# Patient Record
Sex: Male | Born: 1950
Health system: Southern US, Community
[De-identification: ages and names within clinical notes are randomized; demographics above are authoritative.]

## PROBLEM LIST (undated history)

## (undated) DIAGNOSIS — C439 Malignant melanoma of skin, unspecified: Secondary | ICD-10-CM

## (undated) DIAGNOSIS — I10 Essential (primary) hypertension: Secondary | ICD-10-CM

## (undated) DIAGNOSIS — C4490 Unspecified malignant neoplasm of skin, unspecified: Secondary | ICD-10-CM

## (undated) DIAGNOSIS — F419 Anxiety disorder, unspecified: Secondary | ICD-10-CM

## (undated) DIAGNOSIS — Z9049 Acquired absence of other specified parts of digestive tract: Secondary | ICD-10-CM

## (undated) DIAGNOSIS — R220 Localized swelling, mass and lump, head: Secondary | ICD-10-CM

## (undated) DIAGNOSIS — Z973 Presence of spectacles and contact lenses: Secondary | ICD-10-CM

## (undated) DIAGNOSIS — C801 Malignant (primary) neoplasm, unspecified: Secondary | ICD-10-CM

## (undated) DIAGNOSIS — F1011 Alcohol abuse, in remission: Secondary | ICD-10-CM

## (undated) DIAGNOSIS — K766 Portal hypertension: Secondary | ICD-10-CM

## (undated) DIAGNOSIS — C419 Malignant neoplasm of bone and articular cartilage, unspecified: Secondary | ICD-10-CM

## (undated) DIAGNOSIS — F32A Depression, unspecified: Secondary | ICD-10-CM

## (undated) DIAGNOSIS — T8859XA Other complications of anesthesia, initial encounter: Secondary | ICD-10-CM

## (undated) DIAGNOSIS — K219 Gastro-esophageal reflux disease without esophagitis: Secondary | ICD-10-CM

## (undated) DIAGNOSIS — E782 Mixed hyperlipidemia: Secondary | ICD-10-CM

## (undated) DIAGNOSIS — F1910 Other psychoactive substance abuse, uncomplicated: Secondary | ICD-10-CM

## (undated) DIAGNOSIS — K7460 Unspecified cirrhosis of liver: Secondary | ICD-10-CM

## (undated) DIAGNOSIS — Z9089 Acquired absence of other organs: Secondary | ICD-10-CM

## (undated) DIAGNOSIS — D509 Iron deficiency anemia, unspecified: Secondary | ICD-10-CM

## (undated) DIAGNOSIS — C099 Malignant neoplasm of tonsil, unspecified: Secondary | ICD-10-CM

## (undated) DIAGNOSIS — C61 Malignant neoplasm of prostate: Secondary | ICD-10-CM

## (undated) HISTORY — DX: Alcohol abuse, in remission: F10.11

## (undated) HISTORY — DX: Mixed hyperlipidemia: E78.2

## (undated) HISTORY — DX: Gastro-esophageal reflux disease without esophagitis: K21.9

## (undated) HISTORY — DX: Acquired absence of other organs: Z90.89

## (undated) HISTORY — PX: PROSTATE BIOPSY: SHX241

## (undated) HISTORY — PX: TONSILLECTOMY: SUR1361

## (undated) HISTORY — PX: COLON SURGERY: SHX602

## (undated) HISTORY — DX: Other psychoactive substance abuse, uncomplicated: F19.10

## (undated) HISTORY — DX: Iron deficiency anemia, unspecified: D50.9

## (undated) HISTORY — DX: Acquired absence of other specified parts of digestive tract: Z90.49

## (undated) HISTORY — DX: Malignant melanoma of skin, unspecified: C43.9

## (undated) HISTORY — DX: Malignant neoplasm of tonsil, unspecified: C09.9

## (undated) HISTORY — PX: HERNIA REPAIR: SHX51

## (undated) HISTORY — PX: MOUTH SURGERY: SHX715

## (undated) HISTORY — DX: Essential (primary) hypertension: I10

## (undated) HISTORY — DX: Malignant (primary) neoplasm, unspecified: C80.1

---
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
## 1992-05-25 DIAGNOSIS — C439 Malignant melanoma of skin, unspecified: Secondary | ICD-10-CM

## 1992-05-25 HISTORY — DX: Malignant melanoma of skin, unspecified: C43.9

## 2011-10-19 ENCOUNTER — Inpatient Hospital Stay: Payer: Self-pay | Admitting: Psychiatry

## 2011-10-19 LAB — CBC
MCH: 33.3 pg (ref 26.0–34.0)
MCHC: 32.8 g/dL (ref 32.0–36.0)
MCV: 102 fL — ABNORMAL HIGH (ref 80–100)
Platelet: 345 10*3/uL (ref 150–440)
RBC: 4.04 10*6/uL — ABNORMAL LOW (ref 4.40–5.90)
RDW: 14.2 % (ref 11.5–14.5)

## 2011-10-19 LAB — COMPREHENSIVE METABOLIC PANEL
BUN: 6 mg/dL — ABNORMAL LOW (ref 7–18)
Bilirubin,Total: 1.2 mg/dL — ABNORMAL HIGH (ref 0.2–1.0)
Calcium, Total: 8.1 mg/dL — ABNORMAL LOW (ref 8.5–10.1)
Co2: 27 mmol/L (ref 21–32)
EGFR (African American): >60
Glucose: 121 mg/dL — ABNORMAL HIGH (ref 65–99)
Osmolality: 260 (ref 275–301)
Potassium: 3.8 mmol/L (ref 3.5–5.1)
SGPT (ALT): 55 U/L
Sodium: 130 mmol/L — ABNORMAL LOW (ref 136–145)

## 2011-10-19 LAB — ETHANOL
Ethanol %: 0.191 % — ABNORMAL HIGH (ref 0.000–0.080)
Ethanol: 191 mg/dL

## 2011-10-19 LAB — TSH: Thyroid Stimulating Horm: 2.04 u[IU]/mL

## 2011-10-19 LAB — URINALYSIS, COMPLETE
Blood: NEGATIVE
Glucose,UR: NEGATIVE mg/dL (ref 0–75)
Leukocyte Esterase: NEGATIVE
Ph: 6 (ref 4.5–8.0)
Protein: 100
RBC,UR: 1 /HPF (ref 0–5)
Specific Gravity: 1.02 (ref 1.003–1.030)
WBC UR: 5 /HPF (ref 0–5)

## 2011-10-19 LAB — DRUG SCREEN, URINE
Amphetamines, Ur Screen: NEGATIVE (ref ?–1000)
Barbiturates, Ur Screen: NEGATIVE (ref ?–200)
Cannabinoid 50 Ng, Ur ~~LOC~~: NEGATIVE (ref ?–50)
Cocaine Metabolite,Ur ~~LOC~~: NEGATIVE (ref ?–300)
MDMA (Ecstasy)Ur Screen: NEGATIVE (ref ?–500)
Methadone, Ur Screen: NEGATIVE (ref ?–300)
Opiate, Ur Screen: NEGATIVE (ref ?–300)

## 2011-10-20 LAB — BEHAVIORAL MEDICINE 1 PANEL
BUN: 6 mg/dL — ABNORMAL LOW (ref 7–18)
Basophil %: 1.4 %
Bilirubin,Total: 1.2 mg/dL — ABNORMAL HIGH (ref 0.2–1.0)
Calcium, Total: 7.5 mg/dL — ABNORMAL LOW (ref 8.5–10.1)
Chloride: 92 mmol/L — ABNORMAL LOW (ref 98–107)
Co2: 26 mmol/L (ref 21–32)
EGFR (Non-African Amer.): >60
Eosinophil #: 0.1 10*3/uL (ref 0.0–0.7)
HCT: 36.4 % — ABNORMAL LOW (ref 40.0–52.0)
HGB: 11.8 g/dL — ABNORMAL LOW (ref 13.0–18.0)
Lymphocyte %: 17.6 %
MCH: 32.6 pg (ref 26.0–34.0)
MCV: 101 fL — ABNORMAL HIGH (ref 80–100)
Neutrophil #: 7.4 10*3/uL — ABNORMAL HIGH (ref 1.4–6.5)
Osmolality: 260 (ref 275–301)
Potassium: 3.1 mmol/L — ABNORMAL LOW (ref 3.5–5.1)
RDW: 14.4 % (ref 11.5–14.5)
SGPT (ALT): 40 U/L
Thyroid Stimulating Horm: 2.45 u[IU]/mL
Total Protein: 6.1 g/dL — ABNORMAL LOW (ref 6.4–8.2)

## 2011-10-21 LAB — COMPREHENSIVE METABOLIC PANEL
Albumin: 2.2 g/dL — ABNORMAL LOW (ref 3.4–5.0)
Anion Gap: 8 (ref 7–16)
BUN: 7 mg/dL (ref 7–18)
Chloride: 92 mmol/L — ABNORMAL LOW (ref 98–107)
Creatinine: 0.73 mg/dL (ref 0.60–1.30)
EGFR (African American): >60
EGFR (Non-African Amer.): >60
Potassium: 3.6 mmol/L (ref 3.5–5.1)
SGPT (ALT): 34 U/L
Total Protein: 6 g/dL — ABNORMAL LOW (ref 6.4–8.2)

## 2011-10-21 LAB — CBC
HCT: 34.6 % — ABNORMAL LOW (ref 40.0–52.0)
MCH: 33.7 pg (ref 26.0–34.0)
MCV: 101 fL — ABNORMAL HIGH (ref 80–100)
RBC: 3.42 10*6/uL — ABNORMAL LOW (ref 4.40–5.90)
RDW: 14 % (ref 11.5–14.5)

## 2011-10-21 LAB — AMMONIA: Ammonia, Plasma: <25 mcmol/L (ref 11–32)

## 2011-10-21 LAB — LIPASE, BLOOD: Lipase: 123 U/L (ref 73–393)

## 2011-10-23 LAB — URINALYSIS, COMPLETE
Blood: NEGATIVE
Hyaline Cast: 31
Leukocyte Esterase: NEGATIVE
Nitrite: NEGATIVE
RBC,UR: 2 /HPF (ref 0–5)
WBC UR: 6 /HPF (ref 0–5)

## 2014-02-23 DIAGNOSIS — K7030 Alcoholic cirrhosis of liver without ascites: Secondary | ICD-10-CM | POA: Insufficient documentation

## 2014-09-16 NOTE — H&P (Signed)
PATIENT NAME:  Frank Castillo, Frank Castillo MR#:  [id number] DATE OF BIRTH:  25-Nov-1950  DATE OF ADMISSION:  10/20/2011  REFERRING PHYSICIAN: Lenise Arena, MD  ATTENDING PHYSICIAN: Orson Slick, MD   IDENTIFYING DATA: Mr. Conchas is a 64 year old alcoholic.  CHIEF COMPLAINT: "I need to stop."   HISTORY OF PRESENT ILLNESS: Mr. Spychalski reports lifelong history of drinking. He has been drinking heavily liquor lately. His sister urged him to come to the hospital for alcohol detox. He will consider substance abuse rehab at this point as he agrees with his sister that he needs to curtail his drinking. He denies other than alcohol substance use. He denies symptoms of anxiety, psychosis, or symptoms suggestive of bipolar mania. Apparently the patient has been passively suicidal for the past several weeks, but never developed a plan. He reports decreased sleep, poor appetite and energy, anhedonia, feeling of guilt, hopelessness, worthlessness, crying spells, and inability to care for his 62 year old mother with macular degeneration.   PAST PSYCHIATRIC HISTORY: He has never been hospitalized. No suicide attempts. No substance abuse treatments.   FAMILY PSYCHIATRIC HISTORY: He has one sister who died in a suicide attempt who used to take drugs.   PAST MEDICAL HISTORY: None reported.  DRUG ALLERGIES: Ophthalmic drops, mycins, irritate his eyes.   MEDICATIONS ON ADMISSION: Metoprolol 50 mg daily.   SOCIAL HISTORY: He has been employed in food services for many years. Also at times he took care of his family. He is currently caring for his elderly mother. He denies other than alcohol substance use.   REVIEW OF SYSTEMS: CONSTITUTIONAL: No fevers or chills. No weight changes. EYES: No double or blurred vision. ENT: No hearing loss. RESPIRATORY: No shortness of breath or cough. CARDIOVASCULAR: No chest pain or orthopnea. GASTROINTESTINAL: No abdominal pain, nausea, vomiting, or diarrhea. GU: No incontinence  or frequency. ENDOCRINE: No heat or cold intolerance. LYMPHATIC: No anemia or easy bruising. INTEGUMENTARY: No acne or rash. MUSCULOSKELETAL: No muscle or joint pain. NEUROLOGIC: No tingling or weakness. PSYCHIATRIC: See history of present illness for details.   PHYSICAL EXAMINATION:   VITAL SIGNS: Blood pressure 114/85, pulse 111, temperature 98.6, respirations 20.   GENERAL: This is a slender male looking his stated age, in no acute distress.   HEENT: The pupils are equal, round, and reactive to light. Sclerae anicteric.   NECK: Supple. No thyromegaly.   LUNGS: Clear to auscultation. No dullness to percussion.   HEART: Regular rhythm and rate. No murmurs, rubs, or gallops.   MUSCULOSKELETAL: Normal muscle strength in all extremities.   SKIN: No rashes or bruises.   LYMPHATIC: No cervical adenopathy.   NEUROLOGIC: Cranial nerves II through XII are intact. Normal gait.   LABS/STUDIES: Chemistries: Blood glucose 64, BUN 6, creatinine 0.56, sodium 132, potassium 3.1, and chloride 92. Blood alcohol level on admission 0.191. LFTs within normal limits, except for total bilirubin of 1.2 and AST 113. TSH 2.45. Urine tox screen negative for substances. CBC: White blood count 10.6, RBC 3.62, hemoglobin 11.8, hematocrit 36.8, and platelets 250. Urinalysis is not suggestive of urinary tract infection.   MENTAL STATUS EXAMINATION ON ADMISSION: The patient is alert and oriented to person, place, time, and situation. He is pleasant, polite, and cooperative. He is wearing hospital scrubs. He maintains good eye contact. His speech is soft. Mood is depressed with flat affect. Thought processing is logical and goal oriented. Thought content - he denies suicidal or homicidal ideation but disclosed suicidal ideation without a plan on admission. There are no  delusions or paranoia. There are no auditory or visual hallucinations. His cognition is grossly intact. He registers three out of three and recalls three  out of three objects after 5 minutes. He can spell world forwards and backwards. He can do serial sevens. He knows four past presidents. His insight and judgment are questionable.   SUICIDE RISK ASSESSMENT ON ADMISSION: This is a patient with a lifelong history of alcoholism who came to the hospital complaining of passive suicidal thoughts, at the urging of his sister, for alcohol treatment.   DIAGNOSES:  1. Alcohol dependence.  2. Substance-induced mood disorder.   AXIS II: Deferred.   AXIS III: Hypertension.   AXIS IV: Substance abuse, employment, financial, primary support.  AXIS V: GAF on admission 25.   PLAN: The patient was admitted to [facility] unit for safety, stabilization and medication management. He was initially placed on suicide precautions and was closely monitored for any unsafe behaviors. He underwent full psychiatric and risk assessment. He received pharmacotherapy, individual and group psychotherapy, substance abuse counseling, and support from therapeutic milieu.  1. Suicidal ideation: This has resolved. The patient is able to contract for safety.  2. Alcohol detox: He is on standard CIWA protocol and will be monitored for symptoms of alcohol withdrawal.  3. Mood: The patient had one trial with Zoloft many years ago. He was inconsistently taking it. He is not interested in taking an antidepressant now.  4. Substance abuse: The patient is interested in alcohol rehab.      5. Hypertension: We will continue metoprolol.  6. Disposition: To be established.  ____________________________ Wardell Honour. Bary Leriche, MD [id number] D: 10/20/2011 15:11:56 ET T: 10/20/2011 15:48:13 ET JOB#: [id number]  cc: Mannie Wineland B. Bary Leriche, MD, <Dictator> Clovis Fredrickson MD ELECTRONICALLY SIGNED 10/27/2011 22:00

## 2015-05-26 HISTORY — PX: COLON SURGERY: SHX602

## 2015-05-26 HISTORY — PX: TONSILLECTOMY: SUR1361

## 2015-06-07 LAB — VITAMIN D 25 HYDROXY (VIT D DEFICIENCY, FRACTURES): Vit D, 25-Hydroxy: 21.8

## 2015-06-17 LAB — LIPID PANEL
CHOLESTEROL: 226 — AB (ref 0–200)
HDL: 30 — AB (ref 35–70)
LDL Cholesterol: 160
Triglycerides: 178 — AB (ref 40–160)

## 2015-06-17 LAB — BASIC METABOLIC PANEL
BUN: 13 (ref 4–21)
Glucose: 110
POTASSIUM: 4.9 (ref 3.4–5.3)
Sodium: 139 (ref 137–147)

## 2015-06-17 LAB — CBC AND DIFFERENTIAL
HCT: 44 (ref 41–53)
Hemoglobin: 14.1 (ref 13.5–17.5)
Platelets: 258 (ref 150–399)
WBC: 9

## 2015-06-26 DIAGNOSIS — C099 Malignant neoplasm of tonsil, unspecified: Secondary | ICD-10-CM

## 2015-06-26 HISTORY — DX: Malignant neoplasm of tonsil, unspecified: C09.9

## 2015-07-03 ENCOUNTER — Other Ambulatory Visit: Payer: Self-pay | Admitting: Otolaryngology

## 2015-07-03 ENCOUNTER — Encounter (HOSPITAL_BASED_OUTPATIENT_CLINIC_OR_DEPARTMENT_OTHER): Payer: Self-pay | Admitting: *Deleted

## 2015-07-04 ENCOUNTER — Encounter (HOSPITAL_BASED_OUTPATIENT_CLINIC_OR_DEPARTMENT_OTHER)
Admission: RE | Admit: 2015-07-04 | Discharge: 2015-07-04 | Disposition: A | Discharge status: Home / Self Care | Payer: BLUE CROSS/BLUE SHIELD | Source: Ambulatory Visit | Attending: Otolaryngology | Admitting: Otolaryngology

## 2015-07-04 DIAGNOSIS — I10 Essential (primary) hypertension: Secondary | ICD-10-CM | POA: Insufficient documentation

## 2015-07-04 DIAGNOSIS — Z01812 Encounter for preprocedural laboratory examination: Secondary | ICD-10-CM | POA: Insufficient documentation

## 2015-07-04 DIAGNOSIS — Z01810 Encounter for preprocedural cardiovascular examination: Secondary | ICD-10-CM | POA: Insufficient documentation

## 2015-07-04 LAB — BASIC METABOLIC PANEL
Anion gap: 8 (ref 5–15)
BUN: 17 mg/dL (ref 6–20)
CO2: 28 mmol/L (ref 22–32)
Calcium: 9.6 mg/dL (ref 8.9–10.3)
Chloride: 100 mmol/L — ABNORMAL LOW (ref 101–111)
Creatinine, Ser: 0.98 mg/dL (ref 0.61–1.24)
Glucose, Bld: 90 mg/dL (ref 65–99)
POTASSIUM: 4.6 mmol/L (ref 3.5–5.1)
SODIUM: 136 mmol/L (ref 135–145)

## 2015-07-09 ENCOUNTER — Ambulatory Visit (HOSPITAL_BASED_OUTPATIENT_CLINIC_OR_DEPARTMENT_OTHER): Payer: BLUE CROSS/BLUE SHIELD | Admitting: Anesthesiology

## 2015-07-09 ENCOUNTER — Encounter (HOSPITAL_BASED_OUTPATIENT_CLINIC_OR_DEPARTMENT_OTHER): Payer: Self-pay | Admitting: *Deleted

## 2015-07-09 ENCOUNTER — Encounter (HOSPITAL_BASED_OUTPATIENT_CLINIC_OR_DEPARTMENT_OTHER)
Admission: RE | Disposition: A | Discharge status: Home / Self Care | Payer: Self-pay | Source: Ambulatory Visit | Attending: Otolaryngology

## 2015-07-09 ENCOUNTER — Ambulatory Visit (HOSPITAL_BASED_OUTPATIENT_CLINIC_OR_DEPARTMENT_OTHER)
Admission: RE | Admit: 2015-07-09 | Discharge: 2015-07-09 | Disposition: A | Discharge status: Home / Self Care | Payer: BLUE CROSS/BLUE SHIELD | Source: Ambulatory Visit | Attending: Otolaryngology | Admitting: Otolaryngology

## 2015-07-09 DIAGNOSIS — I10 Essential (primary) hypertension: Secondary | ICD-10-CM | POA: Insufficient documentation

## 2015-07-09 DIAGNOSIS — F17210 Nicotine dependence, cigarettes, uncomplicated: Secondary | ICD-10-CM | POA: Diagnosis not present

## 2015-07-09 DIAGNOSIS — C099 Malignant neoplasm of tonsil, unspecified: Secondary | ICD-10-CM | POA: Diagnosis not present

## 2015-07-09 DIAGNOSIS — R221 Localized swelling, mass and lump, neck: Secondary | ICD-10-CM | POA: Diagnosis present

## 2015-07-09 HISTORY — PX: TONSILLECTOMY: SHX5217

## 2015-07-09 HISTORY — DX: Localized swelling, mass and lump, head: R22.0

## 2015-07-09 HISTORY — DX: Essential (primary) hypertension: I10

## 2015-07-09 SURGERY — TONSILLECTOMY
Anesthesia: General | Site: Throat | Laterality: Left

## 2015-07-09 MED ORDER — HYDROCODONE-ACETAMINOPHEN 7.5-325 MG/15ML PO SOLN
ORAL | Status: AC
Start: 2015-07-09 — End: 2015-07-09
  Filled 2015-07-09: qty 15

## 2015-07-09 MED ORDER — LACTATED RINGERS IV SOLN
INTRAVENOUS | Status: DC
  Administered 2015-07-09 (×2): via INTRAVENOUS

## 2015-07-09 MED ORDER — PROPOFOL 10 MG/ML IV BOLUS
INTRAVENOUS | Status: DC | PRN
  Administered 2015-07-09: 200 mg via INTRAVENOUS

## 2015-07-09 MED ORDER — FENTANYL CITRATE (PF) 100 MCG/2ML IJ SOLN
25.0000 ug | INTRAMUSCULAR | Status: DC | PRN
  Administered 2015-07-09: 50 ug via INTRAVENOUS

## 2015-07-09 MED ORDER — SCOPOLAMINE 1 MG/3DAYS TD PT72: 1.0000 | MEDICATED_PATCH | Freq: Once | TRANSDERMAL | Status: DC | PRN

## 2015-07-09 MED ORDER — EPHEDRINE SULFATE 50 MG/ML IJ SOLN
INTRAMUSCULAR | Status: AC
  Filled 2015-07-09: qty 1

## 2015-07-09 MED ORDER — MIDAZOLAM HCL 2 MG/2ML IJ SOLN
1.0000 mg | INTRAMUSCULAR | Status: DC | PRN
  Administered 2015-07-09: 2 mg via INTRAVENOUS

## 2015-07-09 MED ORDER — BACITRACIN 500 UNIT/GM EX OINT
TOPICAL_OINTMENT | CUTANEOUS | Status: DC | PRN
  Administered 2015-07-09: 1 via TOPICAL

## 2015-07-09 MED ORDER — MIDAZOLAM HCL 2 MG/2ML IJ SOLN
INTRAMUSCULAR | Status: AC
  Filled 2015-07-09: qty 2

## 2015-07-09 MED ORDER — DEXAMETHASONE SODIUM PHOSPHATE 4 MG/ML IJ SOLN
INTRAMUSCULAR | Status: DC | PRN
  Administered 2015-07-09: 10 mg via INTRAVENOUS

## 2015-07-09 MED ORDER — FENTANYL CITRATE (PF) 100 MCG/2ML IJ SOLN
INTRAMUSCULAR | Status: AC
Start: 2015-07-09 — End: 2015-07-09
  Filled 2015-07-09: qty 2

## 2015-07-09 MED ORDER — HYDROCODONE-ACETAMINOPHEN 5-325 MG PO TABS
ORAL_TABLET | ORAL | Status: AC
Start: 2015-07-09 — End: 2015-07-09
  Filled 2015-07-09: qty 1

## 2015-07-09 MED ORDER — EPHEDRINE SULFATE 50 MG/ML IJ SOLN
INTRAMUSCULAR | Status: DC | PRN
  Administered 2015-07-09 (×2): 10 mg via INTRAVENOUS

## 2015-07-09 MED ORDER — DEXAMETHASONE SODIUM PHOSPHATE 10 MG/ML IJ SOLN
INTRAMUSCULAR | Status: AC
  Filled 2015-07-09: qty 1

## 2015-07-09 MED ORDER — HYDROCODONE-ACETAMINOPHEN 7.5-325 MG/15ML PO SOLN
15.0000 mL | Freq: Once | ORAL | Status: AC | PRN
  Administered 2015-07-09: 15 mL via ORAL

## 2015-07-09 MED ORDER — HYDROCODONE-ACETAMINOPHEN 7.5-325 MG PO TABS: 1.0000 | ORAL_TABLET | Freq: Once | ORAL | Status: DC | PRN

## 2015-07-09 MED ORDER — LIDOCAINE HCL 4 % EX SOLN
CUTANEOUS | Status: DC | PRN
  Administered 2015-07-09: 3 mL via TOPICAL

## 2015-07-09 MED ORDER — FENTANYL CITRATE (PF) 100 MCG/2ML IJ SOLN
INTRAMUSCULAR | Status: AC
  Filled 2015-07-09: qty 2

## 2015-07-09 MED ORDER — PROPOFOL 10 MG/ML IV BOLUS
INTRAVENOUS | Status: AC
  Filled 2015-07-09: qty 20

## 2015-07-09 MED ORDER — OXYCODONE HCL 5 MG/5ML PO SOLN: 5.0000 mg | ORAL | Status: DC | PRN

## 2015-07-09 MED ORDER — SUCCINYLCHOLINE CHLORIDE 20 MG/ML IJ SOLN
INTRAMUSCULAR | Status: DC | PRN
  Administered 2015-07-09: 100 mg via INTRAVENOUS

## 2015-07-09 MED ORDER — GLYCOPYRROLATE 0.2 MG/ML IJ SOLN: 0.2000 mg | Freq: Once | INTRAMUSCULAR | Status: DC | PRN

## 2015-07-09 MED ORDER — FENTANYL CITRATE (PF) 100 MCG/2ML IJ SOLN
50.0000 ug | INTRAMUSCULAR | Status: DC | PRN
  Administered 2015-07-09: 100 ug via INTRAVENOUS

## 2015-07-09 MED ORDER — PROMETHAZINE HCL 25 MG/ML IJ SOLN
6.2500 mg | INTRAMUSCULAR | Status: DC | PRN
Start: 2015-07-09 — End: 2015-07-09

## 2015-07-09 MED ORDER — LIDOCAINE HCL (CARDIAC) 20 MG/ML IV SOLN
INTRAVENOUS | Status: DC | PRN
  Administered 2015-07-09: 80 mg via INTRAVENOUS

## 2015-07-09 MED ORDER — ONDANSETRON HCL 4 MG/2ML IJ SOLN
INTRAMUSCULAR | Status: AC
  Filled 2015-07-09: qty 2

## 2015-07-09 MED ORDER — SODIUM CHLORIDE 0.9 % IR SOLN
Status: DC | PRN
  Administered 2015-07-09: 1

## 2015-07-09 SURGICAL SUPPLY — 82 items
BANDAGE COBAN STERILE 2 (GAUZE/BANDAGES/DRESSINGS) IMPLANT
BENZOIN TINCTURE PRP APPL 2/3 (GAUZE/BANDAGES/DRESSINGS) IMPLANT
BLADE SURG 15 STRL LF DISP TIS (BLADE) IMPLANT
BLADE SURG 15 STRL SS (BLADE)
CANISTER SUCT 1200ML W/VALVE (MISCELLANEOUS) ×3 IMPLANT
CATH ROBINSON RED A/P 10FR (CATHETERS) IMPLANT
CATH ROBINSON RED A/P 14FR (CATHETERS) ×3 IMPLANT
CLEANER CAUTERY TIP 5X5 PAD (MISCELLANEOUS) IMPLANT
CLIP TI MEDIUM 6 (CLIP) IMPLANT
CLIP TI WIDE RED SMALL 6 (CLIP) IMPLANT
COAGULATOR SUCT SWTCH 10FR 6 (ELECTROSURGICAL) IMPLANT
CORDS BIPOLAR (ELECTRODE) IMPLANT
COVER BACK TABLE 60X90IN (DRAPES) IMPLANT
COVER MAYO STAND STRL (DRAPES) ×3 IMPLANT
DECANTER SPIKE VIAL GLASS SM (MISCELLANEOUS) IMPLANT
DRAIN JACKSON RD 7FR 3/32 (WOUND CARE) IMPLANT
DRAIN PENROSE 1/4X12 LTX STRL (WOUND CARE) IMPLANT
DRAIN TLS ROUND 10FR (DRAIN) IMPLANT
DRAPE U-SHAPE 76X120 STRL (DRAPES) ×3 IMPLANT
ELECT COATED BLADE 2.86 ST (ELECTRODE) IMPLANT
ELECT NEEDLE BLADE 2-5/6 (NEEDLE) IMPLANT
ELECT PAIRED SUBDERMAL (MISCELLANEOUS)
ELECT REM PT RETURN 9FT ADLT (ELECTROSURGICAL) ×3
ELECT REM PT RETURN 9FT PED (ELECTROSURGICAL)
ELECTRODE PAIRED SUBDERMAL (MISCELLANEOUS) IMPLANT
ELECTRODE REM PT RETRN 9FT PED (ELECTROSURGICAL) IMPLANT
ELECTRODE REM PT RTRN 9FT ADLT (ELECTROSURGICAL) ×2 IMPLANT
EVACUATOR SILICONE 100CC (DRAIN) IMPLANT
FORCEPS BIPOLAR SPETZLER 8 1.0 (NEUROSURGERY SUPPLIES) IMPLANT
GAUZE SPONGE 4X4 16PLY XRAY LF (GAUZE/BANDAGES/DRESSINGS) IMPLANT
GLOVE BIO SURGEON STRL SZ7.5 (GLOVE) ×3 IMPLANT
GLOVE ECLIPSE 6.5 STRL STRAW (GLOVE) ×3 IMPLANT
GLOVE SKINSENSE NS SZ7.5 (GLOVE) ×1
GLOVE SKINSENSE STRL SZ7.5 (GLOVE) ×2 IMPLANT
GOWN STRL REUS W/ TWL LRG LVL3 (GOWN DISPOSABLE) ×6 IMPLANT
GOWN STRL REUS W/TWL LRG LVL3 (GOWN DISPOSABLE) ×3
HEMOSTAT SURGICEL .5X2 ABSORB (HEMOSTASIS) IMPLANT
IV NS 500ML (IV SOLUTION) ×1
IV NS 500ML BAXH (IV SOLUTION) ×2 IMPLANT
LIQUID BAND (GAUZE/BANDAGES/DRESSINGS) IMPLANT
LOCATOR NERVE 3 VOLT (DISPOSABLE) IMPLANT
MARKER SKIN DUAL TIP RULER LAB (MISCELLANEOUS) IMPLANT
NEEDLE HYPO 25X1 1.5 SAFETY (NEEDLE) IMPLANT
NEEDLE PRECISIONGLIDE 27X1.5 (NEEDLE) IMPLANT
NS IRRIG 1000ML POUR BTL (IV SOLUTION) ×3 IMPLANT
PACK BASIN DAY SURGERY FS (CUSTOM PROCEDURE TRAY) IMPLANT
PAD CLEANER CAUTERY TIP 5X5 (MISCELLANEOUS)
PENCIL BUTTON HOLSTER BLD 10FT (ELECTRODE) IMPLANT
PIN SAFETY STERILE (MISCELLANEOUS) IMPLANT
PROBE NERVBE PRASS .33 (MISCELLANEOUS) IMPLANT
SHEARS HARMONIC 9CM CVD (BLADE) IMPLANT
SHEET MEDIUM DRAPE 40X70 STRL (DRAPES) ×3 IMPLANT
SLEEVE SCD COMPRESS KNEE MED (MISCELLANEOUS) IMPLANT
SOLUTION BUTLER CLEAR DIP (MISCELLANEOUS) ×3 IMPLANT
SPONGE GAUZE 2X2 8PLY STRL LF (GAUZE/BANDAGES/DRESSINGS) IMPLANT
SPONGE GAUZE 4X4 12PLY STER LF (GAUZE/BANDAGES/DRESSINGS) ×3 IMPLANT
SPONGE TONSIL 1 RF SGL (DISPOSABLE) IMPLANT
SPONGE TONSIL 1.25 RF SGL STRG (GAUZE/BANDAGES/DRESSINGS) ×3 IMPLANT
STAPLER VISISTAT 35W (STAPLE) IMPLANT
STRIP CLOSURE SKIN 1/4X4 (GAUZE/BANDAGES/DRESSINGS) IMPLANT
SUCTION FRAZIER HANDLE 10FR (MISCELLANEOUS)
SUCTION TUBE FRAZIER 10FR DISP (MISCELLANEOUS) IMPLANT
SUT ETHILON 3 0 PS 1 (SUTURE) IMPLANT
SUT ETHILON 4 0 PS 2 18 (SUTURE) IMPLANT
SUT PROLENE 5 0 P 3 (SUTURE) IMPLANT
SUT SILK 3 0 TIES 17X18 (SUTURE)
SUT SILK 3-0 18XBRD TIE BLK (SUTURE) IMPLANT
SUT SILK 4 0 TIES 17X18 (SUTURE) IMPLANT
SUT VIC AB 3-0 FS2 27 (SUTURE) IMPLANT
SUT VIC AB 4-0 P-3 18XBRD (SUTURE) IMPLANT
SUT VIC AB 4-0 P3 18 (SUTURE)
SUT VIC AB 4-0 RB1 27 (SUTURE)
SUT VIC AB 4-0 RB1 27X BRD (SUTURE) IMPLANT
SUT VICRYL 4-0 PS2 18IN ABS (SUTURE) IMPLANT
SYR BULB 3OZ (MISCELLANEOUS) IMPLANT
SYR CONTROL 10ML LL (SYRINGE) IMPLANT
TOWEL OR 17X24 6PK STRL BLUE (TOWEL DISPOSABLE) ×3 IMPLANT
TRAY DSU PREP LF (CUSTOM PROCEDURE TRAY) IMPLANT
TUBE CONNECTING 20X1/4 (TUBING) ×3 IMPLANT
TUBE SALEM SUMP 12R W/ARV (TUBING) IMPLANT
TUBE SALEM SUMP 16 FR W/ARV (TUBING) ×3 IMPLANT
WAND COBLATOR 70 EVAC XTRA (SURGICAL WAND) ×3 IMPLANT

## 2015-07-09 NOTE — Op Note (Signed)
DATE OF PROCEDURE:  07/09/2015                              OPERATIVE REPORT  SURGEON:  Leta Baptist, MD  PREOPERATIVE DIAGNOSES: 1. Left tonsillar mass  POSTOPERATIVE DIAGNOSES: 1. Left tonsillar mass  PROCEDURE PERFORMED: Left tonsillectomy  ANESTHESIA:  General endotracheal tube anesthesia.  COMPLICATIONS:  None.  ESTIMATED BLOOD LOSS:  Minimal.  INDICATION FOR PROCEDURE:  Frank Castillo is a 65 y.o. male who was recently noted to have a left neck mass. He subsequently underwent a neck CT scan. The CT showed a 2 cm left tonsillar mass. The appearance was suspicious for squamous cell carcinoma and metastasis to the left neck. Based on the above findings, the decision was made for patient to undergo the left tonsillectomy procedure. The risks, benefits, alternatives, and details of the procedure were discussed with the mother.  Questions were invited and answered.  Informed consent was obtained.  DESCRIPTION:  The patient was taken to the operating room and placed supine on the operating table.  General endotracheal tube anesthesia was administered by the anesthesiologist.  The patient was positioned and prepped and draped in a standard fashion for tonsillectomy.  A Crowe-Davis mouth gag was inserted into the oral cavity for exposure. 1+ cryptic tonsils were noted bilaterally. A firm mass was palpable within the left tonsillar tissue. The left tonsil was grasped with a straight Allis clamp and retracted medially.  It was resected free from the underlying pharyngeal constrictor muscles with the Coblator device. The specimen was sent to the pathology department for frozen section analysis. 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  The surgical sites were copiously irrigated.  The mouth gag was removed.  The care of the patient was turned over to the anesthesiologist.  The patient was awakened from anesthesia without difficulty.  The patient was extubated and transferred to the  recovery room in good condition.  OPERATIVE FINDINGS:  Left tonsillar squamous cell carcinoma  SPECIMEN: Left tonsillar mass.  FOLLOWUP CARE:  The patient will be discharged home once awake and alert.  He will be placed on oxycodone 5-13ml po q 4 hours for postop pain control.  A referral to oncology will be made as soon as possible.  The patient will follow up in my office in approximately 2 weeks.  [name] 07/09/2015 10:21 AM

## 2015-07-09 NOTE — Transfer of Care (Signed)
Immediate Anesthesia Transfer of Care Note  Patient: Frank Castillo  Procedure(s) Performed: Procedure(s): LEFT TONSILLECTOMY (Left)  Patient Location: PACU  Anesthesia Type:General  Level of Consciousness: sedated  Airway & Oxygen Therapy: Patient Spontanous Breathing and Patient connected to face mask oxygen  Post-op Assessment: Report given to RN and Post -op Vital signs reviewed and stable  Post vital signs: Reviewed and stable  Last Vitals:  Filed Vitals:   07/09/15 0724  BP: 120/77  Pulse: 74  Temp: 36.6 C  Resp: 18    Complications: No apparent anesthesia complications

## 2015-07-09 NOTE — Anesthesia Postprocedure Evaluation (Signed)
Anesthesia Post Note  Patient: Frank Castillo  Procedure(s) Performed: Procedure(s) (LRB): LEFT TONSILLECTOMY (Left)  Patient location during evaluation: PACU Anesthesia Type: General Level of consciousness: awake and alert Pain management: pain level controlled Vital Signs Assessment: post-procedure vital signs reviewed and stable Respiratory status: spontaneous breathing, nonlabored ventilation and respiratory function stable Cardiovascular status: blood pressure returned to baseline and stable Postop Assessment: no signs of nausea or vomiting Anesthetic complications: no    Last Vitals:  Filed Vitals:   07/09/15 1115 07/09/15 1145  BP: 141/78 150/89  Pulse: 76 76  Temp:  36.4 C  Resp: 15 16    Last Pain:  Filed Vitals:   07/09/15 1146  PainSc: 3                  Bach Rocchi A

## 2015-07-09 NOTE — H&P (Signed)
Cc: Left neck mass, possible tonsillar cancer  HPI: The patient is a 65 year old male who presents today for evaluation of his left neck mass and possible left tonsillar carcinoma. According to the patient, he first noted a left submandibular mass approximately 1  months ago.  The mass was nontender to touch. He denies any dysphagia, odynophagia, dysphonia or weight loss.  He has a long smoking history. He has smoked 1 pack per day for 40+ years.  He was evaluated with a neck ultrasound. The ultrasound found a 3.5 cm solid appearing mass at the left Level II neck.  He subsequently underwent a neck CT scan.  The CT showed a subtle round hyper-enhancing lesion within the left palatine tonsil. The appearance was worrisome for malignancy.  The left Level II neck mass is likely a metastatic lymph node.    The patient's review of systems (constitutional, eyes, ENT, cardiovascular, respiratory, GI, musculoskeletal, skin, neurologic, psychiatric, endocrine, hematologic, allergic) is noted in the ROS questionnaire.  It is reviewed with the patient.  Family health history: None.   Major events: Hernia surgery, oral surgery.   Ongoing medical problems: Hypertension, dermatitis, melanoma.   Social history: The patient is single. He smokes one pack of cigarettes per day. He denies the use of alcohol or illegal drugs.   Exam General: Communicates without difficulty, well nourished, no acute distress. Head: Normocephalic, no evidence injury, no tenderness, facial buttresses intact without stepoff. Eyes: PERRL, EOMI. No scleral icterus, conjunctivae clear. Neuro: CN II exam reveals vision grossly intact.  No nystagmus at any point of gaze. Ears: Auricles well formed without lesions.  Ear canals are intact without mass or lesion.  No erythema or edema is appreciated.  The TMs are intact without fluid. Nose: External evaluation reveals normal support and skin without lesions.  Dorsum is intact.  Anterior rhinoscopy  reveals healthy pink mucosa over anterior aspect of inferior turbinates and intact septum.  No purulence noted. Oral:  Oral cavity and oropharynx are intact, symmetric, without erythema or edema.  Mucosa is moist without lesions. 1+ tonsils bilaterally. Neck: Full range of motion without pain.  There is no significant lymphadenopathy.  A left submandibular mass is noted.  Trachea is midline.   Assessment 1.  The patient is otherwise asymptomatic.   2.  No obvious ulceration or visible mass is noted on today's laryngoscopy exam.  He has a 3 cm left level II mass.  Plan 1.  The physical exam and laryngoscopy findings are reviewed with the patient.   2.  Based on the above findings, it is recommended the patient should undergo left tonsillectomy to obtain tissue diagnosis of his left tonsil.  If the frozen section analysis of the left tonsillar tissue is negative, we will proceed with open biopsy of his left neck mass.  3.  The risks, benefits, alternatives and details of the procedures are reviewed with the patient.  4.  The patient would like to proceed with procedure.

## 2015-07-09 NOTE — Anesthesia Preprocedure Evaluation (Signed)
Anesthesia Evaluation  Patient identified by MRN, date of birth, ID band Patient awake    Reviewed: Allergy & Precautions, H&P , NPO status , Patient's Chart, lab work & pertinent test results  History of Anesthesia Complications Negative for: history of anesthetic complications  Airway Mallampati: II  TM Distance: >3 FB Neck ROM: full    Dental no notable dental hx.    Pulmonary Current Smoker,    Pulmonary exam normal breath sounds clear to auscultation       Cardiovascular hypertension, Pt. on medications Normal cardiovascular exam Rhythm:regular Rate:Normal     Neuro/Psych negative neurological ROS     GI/Hepatic negative GI ROS, Neg liver ROS,   Endo/Other  negative endocrine ROS  Renal/GU negative Renal ROS     Musculoskeletal   Abdominal   Peds  Hematology negative hematology ROS (+)   Anesthesia Other Findings   Reproductive/Obstetrics negative OB ROS                             Anesthesia Physical Anesthesia Plan  ASA: II  Anesthesia Plan: General   Post-op Pain Management:    Induction: Intravenous  Airway Management Planned: Oral ETT  Additional Equipment:   Intra-op Plan:   Post-operative Plan:   Informed Consent: I have reviewed the patients History and Physical, chart, labs and discussed the procedure including the risks, benefits and alternatives for the proposed anesthesia with the patient or authorized representative who has indicated his/her understanding and acceptance.   Dental Advisory Given  Plan Discussed with: Anesthesiologist and CRNA  Anesthesia Plan Comments:         Anesthesia Quick Evaluation

## 2015-07-09 NOTE — Anesthesia Procedure Notes (Signed)
Procedure Name: Intubation Date/Time: 07/09/2015 9:36 AM Performed by: Maryella Shivers Pre-anesthesia Checklist: Patient identified, Emergency Drugs available, Suction available and Patient being monitored Patient Re-evaluated:Patient Re-evaluated prior to inductionOxygen Delivery Method: Circle System Utilized Preoxygenation: Pre-oxygenation with 100% oxygen Intubation Type: IV induction Ventilation: Mask ventilation without difficulty Laryngoscope Size: Mac and 3 Grade View: Grade I Tube type: Oral Tube size: 8.0 mm Number of attempts: 1 Airway Equipment and Method: Stylet and Oral airway Placement Confirmation: ETT inserted through vocal cords under direct vision,  positive ETCO2 and breath sounds checked- equal and bilateral Secured at: 22 cm Tube secured with: Tape Dental Injury: Teeth and Oropharynx as per pre-operative assessment

## 2015-07-09 NOTE — Discharge Instructions (Signed)
[facility] Castillo M.D., P.A. Postoperative Instructions for Tonsillectomy  Activity Restrict activity at home for the first two days, resting as much as possible. Light indoor activity is best. You may usually return to school or work within a week but void strenuous activity and sports for two weeks. Sleep with your head elevated on 2-3 pillows for 3-4 days to help decrease swelling. Diet Due to tissue swelling and throat discomfort, you may have little desire to drink for several days. However fluids are very important to prevent dehydration. You will find that non-acidic juices, soups, popsicles, Jell-O, custard, puddings, and any soft or mashed foods taken in small quantities can be swallowed fairly easily. Try to increase your fluid and food intake as the discomfort subsides. It is recommended that a child receive 1-1/2 quarts of fluid in a 24-hour period. Adult require twice this amount.  Discomfort Your sore throat may be relieved by applying an ice collar to your neck and/or by taking Tylenol. You may experience an earache, which is due to referred pain from the throat. Referred ear pain is commonly felt at night when trying to rest.  Bleeding                         Although rare, there is risk of having some bleeding during the first 2 weeks after having a T&A. This usually happens between days 7-10 postoperatively. If you or your child should have any bleeding, try to remain calm. We recommend sitting up quietly in a chair and gently spitting out the blood into a bowl. For adults, gargling gently with ice water may help. If the bleeding does not stop after a short time (5 minutes), is more than 1 teaspoonful, or if you become worried, please call our office at [phone] or go directly to the nearest hospital emergency room. Do not eat or drink anything prior to going to the hospital as you may need to be taken to the operating room in order to control the bleeding. GENERAL  CONSIDERATIONS 1. Brush your teeth regularly. Avoid mouthwashes and gargles for three weeks. You may gargle gently with warm salt-water as necessary or spray with Chloraseptic. You may make salt-water by placing 2 teaspoons of table salt into a quart of fresh water. Warm the salt-water in a microwave to a luke warm temperature.  2. Avoid exposure to colds and upper respiratory infections if possible.  3. If you look into a mirror or into your child's mouth, you will see white-gray patches in the back of the throat. This is normal after having a T&A and is like a scab that forms on the skin after an abrasion. It will disappear once the back of the throat heals completely. However, it may cause a noticeable odor; this too will disappear with time. Again, warm salt-water gargles may be used to help keep the throat clean and promote healing.  4. You may notice a temporary change in voice quality, such as a higher pitched voice or a nasal sound, until healing is complete. This may last for 1-2 weeks and should resolve.  5. Do not take or give you child any medications that we have not prescribed or recommended.  6. Snoring may occur, especially at night, for the first week after a T&A. It is due to swelling of the soft palate and will usually resolve.  Please call our office at [phone] if you have any questions.   

## 2015-07-10 ENCOUNTER — Encounter (HOSPITAL_BASED_OUTPATIENT_CLINIC_OR_DEPARTMENT_OTHER): Payer: Self-pay | Admitting: Otolaryngology

## 2015-07-24 ENCOUNTER — Encounter (HOSPITAL_COMMUNITY): Payer: Self-pay | Admitting: Hematology & Oncology

## 2015-07-24 ENCOUNTER — Encounter (HOSPITAL_COMMUNITY): Payer: BLUE CROSS/BLUE SHIELD | Attending: Hematology & Oncology | Admitting: Hematology & Oncology

## 2015-07-24 VITALS — BP 138/81 | HR 76 | Temp 98.1°F | Resp 18 | Ht 70.0 in | Wt 176.0 lb

## 2015-07-24 DIAGNOSIS — C099 Malignant neoplasm of tonsil, unspecified: Secondary | ICD-10-CM | POA: Diagnosis not present

## 2015-07-24 DIAGNOSIS — Z720 Tobacco use: Secondary | ICD-10-CM | POA: Diagnosis not present

## 2015-07-24 NOTE — Patient Instructions (Addendum)
[place] at [facility] Discharge Instructions  RECOMMENDATIONS MADE BY THE CONSULTANT AND ANY TEST RESULTS WILL BE SENT TO YOUR REFERRING PHYSICIAN.   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  To afford each patient quality time with our provider, please arrive at least 15 minutes before your scheduled appointment time.   Beginning January 23rd 2017 lab work for the [facility] will be done in the  Main lab at [facility] on 1st floor. If you have a lab appointment with the [facility] please come in thru the  Main Entrance and check in at the main information desk  You need to re-schedule your appointment should you arrive 10 or more minutes late.  We strive to give you quality time with our providers, and arriving late affects you and other patients whose appointments are after yours.  Also, if you no show three or more times for appointments you may be dismissed from the clinic at the providers discretion.     Again, thank you for choosing [facility].  Our hope is that these requests will decrease the amount of time that you wait before being seen by our physicians.        _____________________________________________________________  Should you have questions after your visit to [facility], please contact our office at [phone] between the hours of 8:30 a.m. and 4:30 p.m.  Voicemails left after 4:30 p.m. will not be returned until the following business day.  For prescription refill requests, have your pharmacy contact our office.   \

## 2015-07-24 NOTE — Progress Notes (Signed)
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 He is seen at [facility], Hepatitis C negative CT neck with contrast 06/25/2015 at [place] level 2 malignant LAD, largest node is 3.5 cm long axis. R worse than L carotid atherosclerosis and stenosis. CT chest with no acute or significant findings within the chest   HISTORY OF PRESENTING ILLNESS:  Frank Castillo 65 y.o. male is here because of referral by Dr. Benjamine Mola for evaluation of Left tonsil invasive squamous cell carcinoma. Left tonsillectomy performed on 07/09/2015 with Dr. Benjamine Mola.  I personally reviewed and went over pathology results, new diagnosis, and imaging studies with the patient at length.   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 He has family support through two sisters, one who lives in [place] and the other who lives in [place]. He finds support in his A.[employer]. group and church.   The patient noticed his neck was asymmetric and saw his PCP. One week later, he was set up with a sonogram and CT scan at [facility]. He was told the CT scan revealed Left tonsillar squamous cell carcinoma and was referred to Dr. Benjamine Mola. He underwent a Left tonsillectomy on 07/09/2015 with Dr. Benjamine Mola, and his most recent follow up yesterday, 07/23/2015. He has not experienced any issues with his tonsillectomy.   He is seeing Dr. Tamela Oddi for a dental implant. The base has already been put into place and believes his final appointment is next week.   The patient currently smokes 1 ppd.  His appetite is good. He has lost weight but this was on purpose.   He is here for further evaluation of new Left tonsillar carcinoma diagnosis and discussion of treatment options.  MEDICAL HISTORY:  Past Medical History  Diagnosis Date  . Tonsillar  mass     left  . Hypertension     SURGICAL HISTORY: Past Surgical History  Procedure Laterality Date  . Hernia repair Right   . Mouth surgery    . Tonsillectomy Left 07/09/2015    Procedure: LEFT TONSILLECTOMY;  Surgeon: Leta Baptist, MD;  Location: [place];  Service: ENT;  Laterality: Left;    SOCIAL HISTORY: Social History   Social History  . Marital Status: Single    Spouse Name: N/A  . Number of Children: N/A  . Years of Education: N/A   Occupational History  . Not on file.   Social History Main Topics  . Smoking status: Current Every Day Smoker -- 1.00 packs/day    Types: Cigarettes  . Smokeless tobacco: Not on file  . Alcohol Use: No     Comment: former abuse  . Drug Use: No  . Sexual Activity: Not on file   Other Topics Concern  . Not on file   Social History Narrative   Divorced No children. Just step-children. 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 Previously worked in [employer]. Originally from [place], [place]. Current smoker at 1 ppd. Began smoking at 73-17 yo, regularly smoking at 73-21 yo. During his "cocaine years" he smoked about 2 ppd. ETOH, recovering alcoholic. 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 He enjoys reading. 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  FAMILY HISTORY: History reviewed. No pertinent family history. indicated that his mother is alive. He indicated that his father is deceased.   Mother  still living at 57 years-old, with macular degeneration. She has no other health issues. Father deceased. Parents divorced at 69 years-old, so he is not sure what he died from. Father was an alcoholic. 4 sisters. 1 sister died of suicide. 1 sister was adopted. 1 Sister lives in [place]. 1 Sister lives in [place].  ALLERGIES:  has No Known Allergies.  MEDICATIONS:  Current Outpatient Prescriptions  Medication Sig Dispense Refill  . cetirizine (ZYRTEC) 10 MG tablet Take 10 mg by mouth daily.    . cholecalciferol (VITAMIN  D) 1000 units tablet Take 1,000 Units by mouth daily.    Marland Kitchen lisinopril-hydrochlorothiazide (PRINZIDE,ZESTORETIC) 20-25 MG tablet Take 1 tablet by mouth daily.  11  . Melatonin 10 MG TABS Take by mouth.    . oxyCODONE (ROXICODONE) 5 MG/5ML solution Take 5-10 mLs (5-10 mg total) by mouth every 4 (four) hours as needed for severe pain. 250 mL 0   No current facility-administered medications for this visit.    Review of Systems  Constitutional: Positive for weight loss. Negative for fever, chills and malaise/fatigue.       Purposeful weight loss.  HENT: Negative.  Negative for congestion, hearing loss, nosebleeds, sore throat and tinnitus.        L neck mass  Eyes: Negative.  Negative for blurred vision, double vision, pain and discharge.  Respiratory: Negative.  Negative for cough, hemoptysis, sputum production, shortness of breath and wheezing.   Cardiovascular: Negative.  Negative for chest pain, palpitations, claudication, leg swelling and PND.  Gastrointestinal: Negative.  Negative for heartburn, nausea, vomiting, abdominal pain, diarrhea, constipation, blood in stool and melena.  Genitourinary: Negative.  Negative for dysuria, urgency, frequency and hematuria.  Musculoskeletal: Negative.  Negative for myalgias, joint pain and falls.  Skin: Negative.  Negative for itching and rash.  Neurological: Negative.  Negative for dizziness, tingling, tremors, sensory change, speech change, focal weakness, seizures, loss of consciousness, weakness and headaches.  Endo/Heme/Allergies: Negative.  Does not bruise/bleed easily.  Psychiatric/Behavioral: Negative.  Negative for depression, suicidal ideas, memory loss and substance abuse. The patient is not nervous/anxious and does not have insomnia.   All other systems reviewed and are negative.  14 point ROS was done and is otherwise as detailed above or in HPI   PHYSICAL EXAMINATION: ECOG PERFORMANCE STATUS: 0 - Asymptomatic  Filed Vitals:    07/24/15 1515  BP: 138/81  Pulse: 76  Temp: 98.1 F (36.7 C)  Resp: 18   Filed Weights   07/24/15 1515  Weight: 176 lb (79.833 kg)    Physical Exam  Constitutional: He is oriented to person, place, and time and well-developed, well-nourished, and in no distress.  HENT:  Head: Normocephalic and atraumatic.  Nose: Nose normal.  Mouth/Throat: Oropharynx is clear and moist. No oropharyngeal exudate.  Eyes: Conjunctivae and EOM are normal. Pupils are equal, round, and reactive to light. Right eye exhibits no discharge. Left eye exhibits no discharge. No scleral icterus.  Neck: Normal range of motion. Neck supple. No tracheal deviation present. No thyromegaly present.  Two palpable lymph nodes, level 2.  Cardiovascular: Normal rate, regular rhythm and normal heart sounds.  Exam reveals no gallop and no friction rub.   No murmur heard. Pulmonary/Chest: Effort normal and breath sounds normal. He has no wheezes. He has no rales.  Abdominal: Soft. Bowel sounds are normal. He exhibits no distension and no mass. There is no tenderness. There is no rebound and no guarding.  Musculoskeletal: Normal range of motion. He exhibits  no edema.  Lymphadenopathy:       Head (left side): Submental adenopathy present.    He has no cervical adenopathy.  Neurological: He is alert and oriented to person, place, and time. He has normal reflexes. No cranial nerve deficit. Gait normal. Coordination normal.  Skin: Skin is warm and dry. No rash noted.  Psychiatric: Mood, memory, affect and judgment normal.  Nursing note and vitals reviewed.   LABORATORY DATA:  I have reviewed the data as listed Results for AEDEN, MATRANGA (MRN [id number]) as of 07/25/2015 17:30  Ref. Range 07/04/2015 15:00  Sodium Latest Ref Range: 135-145 mmol/L 136  Potassium Latest Ref Range: 3.5-5.1 mmol/L 4.6  Chloride Latest Ref Range: 101-111 mmol/L 100 (L)  CO2 Latest Ref Range: 22-32 mmol/L 28  BUN Latest Ref Range: 6-20 mg/dL 17    Creatinine Latest Ref Range: 0.61-1.24 mg/dL 0.98  Calcium Latest Ref Range: 8.9-10.3 mg/dL 9.6  EGFR (Non-African Amer.) Latest Ref Range: >60 mL/min >60  EGFR (African American) Latest Ref Range: >60 mL/min >60  Glucose Latest Ref Range: 65-99 mg/dL 90  Anion gap Latest Ref Range: 5-15  8   RADIOGRAPHIC STUDIES: I have personally reviewed the radiological reports as listed.   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 He is seen at [facility], Hepatitis C negative CT neck with contrast 06/25/2015 at [place] level 2 malignant LAD, largest node is 3.5 cm long axis. R worse than L carotid atherosclerosis and stenosis. CT chest with no acute or significant findings within the chest [id number]  I personally reviewed and went over pathology results, new diagnosis, and imaging studies with the patient at length.   I spoke with  Dr. Isidore Moos to discuss Frank Castillo case and recommendations are to proceed as follows: 1. PET/CT next week 2. Appointment with Dr. Lawana Chambers 3. Appointment with Dr. Isidore Moos at [facility]  We discussed the role of concurrent therapy in treating his disease. I advised the patient that if concurrent treatment is pursued he will need a temporary FT. In addition he will need speech therapy consultation and nutrition consultation. We did discuss this at length and he understands and agrees to whatever is recommended to treat his malignancy.   I addressed the importance of smoking cessation with the patient in detail.  We discussed the health benefits of cessation.  We discussed the health detriments of ongoing tobacco use including but not limited to COPD, heart disease and malignancy. We reviewed the multiple options for cessation. We discussed other alternatives to quit such as chantix, wellbutrin. We will continue to address this moving forward.  Plan will be for  return in approximately 1 week.  Orders Placed This Encounter  Procedures  . NM PET Image Initial (PI) Skull Base To Thigh    Standing Status: Future     Number of Occurrences:      Standing Expiration Date: 07/23/2016    Order Specific Question:  Reason for Exam (SYMPTOM  OR DIAGNOSIS REQUIRED)    Answer:  head and neck cancer, tonsillar carcinoma neck nodes    Order Specific Question:  Preferred imaging location?    Answer:  [facility]    Order Specific Question:  If indicated for the ordered procedure, I authorize the administration of a radiopharmaceutical per Radiology protocol    Answer:  Yes   All questions were answered. The patient knows to call the clinic with any  problems, questions or concerns. This document serves as a record of services personally performed by Ancil Linsey, MD. It was created on her behalf by Arlyce Harman, a trained medical scribe. The creation of this record is based on the scribe's personal observations and the provider's statements to them. This document has been checked and approved by the attending provider.  I have reviewed the above documentation for accuracy and completeness, and I agree with the above.  This note was electronically signed.    Molli Hazard, MD  07/24/2015 4:03 PM

## 2015-07-26 ENCOUNTER — Ambulatory Visit (HOSPITAL_COMMUNITY): Payer: Self-pay | Admitting: Dentistry

## 2015-07-26 ENCOUNTER — Encounter (HOSPITAL_COMMUNITY): Payer: Self-pay | Admitting: Dentistry

## 2015-07-26 ENCOUNTER — Encounter: Payer: Self-pay | Admitting: *Deleted

## 2015-07-26 VITALS — BP 125/69 | HR 78 | Temp 98.4°F

## 2015-07-26 DIAGNOSIS — C099 Malignant neoplasm of tonsil, unspecified: Secondary | ICD-10-CM | POA: Diagnosis not present

## 2015-07-26 DIAGNOSIS — IMO0002 Reserved for concepts with insufficient information to code with codable children: Secondary | ICD-10-CM

## 2015-07-26 DIAGNOSIS — M264 Malocclusion, unspecified: Secondary | ICD-10-CM

## 2015-07-26 DIAGNOSIS — K0530 Chronic periodontitis, unspecified: Secondary | ICD-10-CM

## 2015-07-26 DIAGNOSIS — K08409 Partial loss of teeth, unspecified cause, unspecified class: Secondary | ICD-10-CM

## 2015-07-26 DIAGNOSIS — K031 Abrasion of teeth: Secondary | ICD-10-CM

## 2015-07-26 DIAGNOSIS — M270 Developmental disorders of jaws: Secondary | ICD-10-CM

## 2015-07-26 DIAGNOSIS — Z01818 Encounter for other preprocedural examination: Secondary | ICD-10-CM | POA: Diagnosis not present

## 2015-07-26 DIAGNOSIS — K036 Deposits [accretions] on teeth: Secondary | ICD-10-CM

## 2015-07-26 NOTE — Patient Instructions (Signed)

## 2015-07-26 NOTE — Progress Notes (Signed)
DENTAL CONSULTATION  Date of Consultation:  07/26/2015 Patient Name:   Frank Castillo Date of Birth:   [birth year] Medical Record Number: [id number]  VITALS: BP 125/69 mmHg  Pulse 78  Temp(Src) 98.4 F (36.9 C) (Oral)  CHIEF COMPLAINT: Patient was referred by Dr. Whitney Muse for a medically necessary pre-chemoradiation therapy dental consultation.  HPI: [name] is a 65 year old male recently diagnosed with squamous cell carcinoma of the left tonsil. Patient with anticipated chemoradiation therapy. Patient is now seen as part of a medically necessary pre-chemoradiation therapy dental protocol examination.  The patient currently denies acute toothaches, swellings, or abscesses. Patient was last seen for an exam and cleaning by his primary dentist, Dr. Jacqulynn Cadet, in August 2016. Patient subsequently had an implant placed in the area of tooth #14 in approximately October of 2016. This was with Dr. Tamela Oddi, an oral surgeon. Patient is due for a final check on the implant placement by Dr. Buelah Manis next week. Patient is due for a dental cleaning with Dr. Luan Pulling and will call to reschedule this appointment. Patient is currently considering his options concerning continuation of his implant retained crown in the area of #14.  PROBLEM LIST: Patient Active Problem List   Diagnosis Date Noted  . Squamous cell carcinoma of left tonsil ([place]) 07/26/2015    Priority: Medium    PMH: Past Medical History  Diagnosis Date  . Tonsillar mass     left  . Hypertension     PSH: Past Surgical History  Procedure Laterality Date  . Hernia repair Right   . Mouth surgery    . Tonsillectomy Left 07/09/2015    Procedure: LEFT TONSILLECTOMY;  Surgeon: Leta Baptist, MD;  Location: [place];  Service: ENT;  Laterality: Left;    ALLERGIES: No Known Allergies  MEDICATIONS: Current Outpatient Prescriptions  Medication Sig Dispense Refill  . cetirizine (ZYRTEC) 10 MG tablet Take  10 mg by mouth daily.    . cholecalciferol (VITAMIN D) 1000 units tablet Take 1,000 Units by mouth daily.    Marland Kitchen lisinopril-hydrochlorothiazide (PRINZIDE,ZESTORETIC) 20-25 MG tablet Take 1 tablet by mouth daily.  11  . Melatonin 10 MG TABS Take by mouth.    . oxyCODONE (ROXICODONE) 5 MG/5ML solution Take 5-10 mLs (5-10 mg total) by mouth every 4 (four) hours as needed for severe pain. (Patient not taking: Reported on 07/26/2015) 250 mL 0   No current facility-administered medications for this visit.    LABS: Lab Results  Component Value Date   WBC 9.0 10/21/2011   HGB 11.5* 10/21/2011   HCT 34.6* 10/21/2011   MCV 101* 10/21/2011   PLT 209 10/21/2011      Component Value Date/Time   NA 136 07/04/2015 1500   NA 132* 10/21/2011 1614   K 4.6 07/04/2015 1500   K 3.6 10/21/2011 1614   CL 100* 07/04/2015 1500   CL 92* 10/21/2011 1614   CO2 28 07/04/2015 1500   CO2 32 10/21/2011 1614   GLUCOSE 90 07/04/2015 1500   GLUCOSE 105* 10/21/2011 1614   BUN 17 07/04/2015 1500   BUN 7 10/21/2011 1614   CREATININE 0.98 07/04/2015 1500   CREATININE 0.73 10/21/2011 1614   CALCIUM 9.6 07/04/2015 1500   CALCIUM 7.9* 10/21/2011 1614   GFRNONAA >60 07/04/2015 1500   GFRNONAA >60 10/21/2011 1614   GFRAA >60 07/04/2015 1500   GFRAA >60 10/21/2011 1614   No results found for: INR, PROTIME No results found for: PTT  SOCIAL HISTORY: Social History  Social History  . Marital Status: Single    Spouse Name: N/A  . Number of Children: 0  . Years of Education: N/A   Occupational History  . Not on file.   Social History Main Topics  . Smoking status: Current Every Day Smoker -- 1.00 packs/day    Types: Cigarettes  . Smokeless tobacco: Never Used  . Alcohol Use: No     Comment: former abuse  . Drug Use: No  . Sexual Activity: Not on file   Other Topics Concern  . Not on file   Social History Narrative   Divorced.   No children.   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    Currently smoking approximately one pack per day.   Has been smoking off/on since age 86.     FAMILY HISTORY: Family History  Problem Relation Age of Onset  . Macular degeneration Mother   . Alcoholism Father     REVIEW OF SYSTEMS: Reviewed ROS from 07/24/15 note of Dr. Whitney Muse and reviewed with patient today. No changes by his report. Review of Systems  Constitutional: Positive for weight loss. Negative for fever, chills and malaise/fatigue. Purposeful weight loss.  HENT: Negative. Negative for congestion, hearing loss, nosebleeds, sore throat and tinnitus. L neck mass  Eyes: Negative. Negative for blurred vision, double vision, pain and discharge. Wears contact in left eye only. Respiratory: Negative. Negative for cough, hemoptysis, sputum production, shortness of breath and wheezing.  Cardiovascular: Negative. Negative for chest pain, palpitations, claudication, leg swelling and PND.  Gastrointestinal: Negative. Negative for heartburn, nausea, vomiting, abdominal pain, diarrhea, constipation, blood in stool and melena.  Genitourinary: Negative. Negative for dysuria, urgency, frequency and hematuria.  Musculoskeletal: Negative. Negative for myalgias, joint pain and falls.  Skin: Negative. Negative for itching and rash.  Neurological: Negative. Negative for dizziness, tingling, tremors, sensory change, speech change, focal weakness, seizures, loss of consciousness, weakness and headaches.  Endo/Heme/Allergies: Negative. Does not bruise/bleed easily.  Psychiatric/Behavioral: Negative. Negative for depression, suicidal ideas, memory loss and substance abuse. The patient is not nervous/anxious and does not have insomnia. Denies dental phobia. All other systems reviewed and are negative.   DENTAL HISTORY: CHIEF COMPLAINT: Patient was referred by Dr. Whitney Muse for a medically necessary pre-chemoradiation therapy dental consultation.  HPI: [name] is a 65 year old  male recently diagnosed with squamous cell carcinoma of the left tonsil. Patient with anticipated chemoradiation therapy. Patient is now seen as part of a medically necessary pre-chemoradiation therapy dental protocol examination.  The patient currently denies acute toothaches, swellings, or abscesses. Patient was last seen for an exam and cleaning by his primary dentist, Dr. Jacqulynn Cadet, in August 2016. Patient subsequently had an implant placed in the area of tooth #14 in approximately October of 2016. This was with Dr. Tamela Oddi, an oral surgeon. Patient is due for a final check on the implant placement by Dr. Buelah Manis next week. Patient is due for a dental cleaning with Dr. Luan Pulling and will call to reschedule this appointment. Patient is currently considering his options concerning continuation of his implant retained crown in the area of #14.  DENTAL EXAMINATION: GENERAL: The patient is a well-developed, well-nourished male in no acute distress. HEAD AND NECK: There is left neck lymphadenopathy noted. No thyroid masses noted. The patient denies acute TMJ symptoms. Patient has a maximum interincisal opening of 45 mm. INTRAORAL EXAM: Patient has normal saliva. Patient has bilateral mandibular lingual tori. There is no evidence of oral abscess formation.  The left tonsil is consistent with previous tonsillectomy. DENTITION: Patient is missing tooth numbers 1, 14, 16, 17, 18, 31, and 32. There is now an implant in the area of #14. The implant has not yet been restored with a crown. PERIODONTAL: Patient has chronic periodontitis with plaque accumulations, gingival recession, and no significant tooth mobility. There was minimal bleeding on probing during the periodontal examination. DENTAL CARIES/SUBOPTIMAL RESTORATIONS: No obvious dental caries are noted. Patient has multiple flexure/abrasion lesions that need to be followed for future restorations. ENDODONTIC: Patient currently denies acute  pulpitis symptoms. I do not see any evidence of periapical pathology or radiolucency. Patient has previous root canal therapy involving tooth #13. CROWN AND BRIDGE: Patient has multiple crown restorations that appear to be acceptable. Patient with pending placement of an plan retrained crown in the area of #14. PROSTHODONTIC: Patient with no partial dentures. OCCLUSION: Patient has a poor occlusal scheme but a stable occlusion at this time.  RADIOGRAPHIC INTERPRETATION: An orthopantogram was taken and supplemented with a full series of dental radiographs. There are multiple missing teeth. There is incipient bone loss noted. There is an implant in the area #14 that has not yet been restored with a crown. There are no obvious periapical radiolucencies. Multiple dental restorations are noted. There is supra-eruption and drifting of the unopposed teeth into the edentulous areas. There are mandibular radiopacities consistent with bilateral mandibular lingual tori.  ASSESSMENTS: 1. Squamous cell carcinoma left tonsil 2. Pre-chemoradiation therapy dental protocol examination 3. Chronic periodontitis with bone loss. 4. Accretions 5. Gingival recession 6. Multiple missing teeth 7. Implant in the area of #14 with no definitive crown restoration. 8. Multiple missing teeth 9. Supra-eruption and drifting of the unopposed teeth into the edentulous areas 10. Poor occlusal scheme but a stable occlusion 11. Bilateral mandibular lingual tori 12. Multiple areas of flexure/toothbrush abrasion  PLAN/RECOMMENDATIONS: 1. I discussed the risks, benefits, and complications of various treatment options with the patient in relationship to his medical and dental conditions, anticipated chemoradiation therapy, and chemoradiation therapy side effects to include xerostomia, radiation caries, trismus, mucositis, taste changes, gum and jawbone changes, and risk for infection and osteoradionecrosis. We discussed various  treatment options to include no treatment, extraction of teeth in the primary field radiation therapy, pre-prosthetic surgery as indicated, periodontal therapy, dental restorations, root canal therapy, crown and bridge therapy, implant therapy, and replacement of missing teeth as indicated. We also discussed fabrication of fluoride trays and scatter protection devices. We also discussed preliminary anticipated ports and doses pending Dr. Lanell Persons full evaluation and a questionable need for dental extractions. At this time, I feel that Dr. Isidore Moos should be able to proceed with radiation therapy without additional dental extractions. The patient currently does NOT wish to have dental extractions at this time if at all possible. He does wish to proceed with impressions today for the fabrication of fluoride trays and scatter guards. He will also follow up with Dr. Buelah Manis for final evaluation for implant therapy he will then follow-up with Dr. Luan Pulling for a dental cleaning and discussion of proceeding with implant therapy at this time or after he has completed his chemoradiation therapy. A prescription for FluoriSHIELD was sent [facility] with refills for one year.   2. Discussion of findings with medical team and coordination of future medical and dental care as needed. I will attempt to contact Dr. Buelah Manis concerning final evaluation of the implant and ability to proceed with definitive crown restoration. I will also contact  Dr. Henrietta Dine concerning the need for periodontal therapy at this time and discussion of implant retained crown therapy in the future.  I spent in excess of  120 minutes during the conduct of this consultation and >50% of this time involved direct face-to-face encounter for counseling and/or coordination of the patient's care.    Lenn Cal, DDS

## 2015-07-26 NOTE — Progress Notes (Signed)
  Oncology Nurse Navigator Documentation  Navigator Location: [facility] (07/26/15 1600) Navigator Encounter Type: Introductory phone call (07/26/15 1600)   Abnormal Finding Date: 07/09/15 (07/26/15 1600)       Placed introductory call to new referral patient.  Introduced myself as the oncology nurse navigator that works with Dr. Isidore Moos to whom he has been referred by Dr. Whitney Muse.  He confirmed understanding of referral and 08/02/15 7:30/8:00 appt date/time.  I briefly explained my role as a navigator, indicated that I would be joining him during his appt next week.  I confirmed understanding of the [facility] location, explained arrival and RadOnc registration process for appt.  I answered his questions re XRT.  He noted he as been contacted by Lupita Raider, Oncology Nurse Navigator at [facility], as well.  I provided my contact information, encouraged him to call with questions/concerns before next week.  He verbalized understanding of information provided, expressed appreciation for my call.  Gayleen Orem, RN, BSN, [place] [facility] [place] [phone]                                     Time Spent with Patient: 15 (07/26/15 1600)

## 2015-07-29 NOTE — Progress Notes (Signed)
Head and Neck Cancer Location of Tumor / Histology: Left Tonsil- Invasive Squamous Cell Carcinoma, Moderately Differentiated.   Patient presented with symptoms of: The patient noticed his neck was asymmetric and saw his PCP. One week later, he was set up with a sonogram and CT scan at [facility] which showed Left Tonsil mass.  Biopsies of Left Tonsil revealed:  07/09/15 Diagnosis Tonsil, Left - INVASIVE SQUAMOUS CELL CARCINOMA, MODERATELY DIFFERENTIATED  Nutrition Status Yes No Comments  Weight changes? [x]  []  He reports loosing weight after tonsilectomy, but reports he has been gaining it slowly back.  Swallowing concerns? []  [x]    PEG? []  [x]     Referrals Yes No Comments  Social Work? []  [x]    Dentistry? [x]  []  He will not have any dental extractions per Dr. Ritta Slot recommendation. He was prescribed fluroshield dental cream.  Swallowing therapy? []  [x]    Nutrition? []  [x]    Med/Onc? [x]  []  Dr. Whitney Muse 07/24/15   Safety Issues Yes No Comments  Prior radiation? []  [x]    Pacemaker/ICD? []  [x]    Possible current pregnancy? []  [x]    Is the patient on methotrexate? []  [x]     Tobacco/Marijuana/Snuff/ETOH use:  Current smoker at 1 ppd. Began smoking at 62-17 yo, regularly smoking at [age] yo. During his "cocaine years" he smoked about 2 ppd. ETOH, recovering alcoholic. 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  Past/Anticipated interventions by otolaryngology, if any:  He underwent a Left tonsillectomy and biopsy on 07/09/2015 with Dr. Benjamine Mola, and his most recent follow up yesterday, 07/23/2015. He has not experienced any issues with his tonsillectomy.   Past/Anticipated interventions by medical oncology, if any:  07/24/15 Dr. Donald Pore note: We discussed the role of concurrent therapy in treating his disease. I advised the patient that if concurrent treatment is pursued he will need a temporary FT. In addition he will need speech therapy consultation and nutrition consultation. We did discuss this at  length and he understands and agrees to whatever is recommended to treat his malignancy.  He had an appointment with her 07/31/15      Current Complaints / other details:   07/30/15 PET  IMPRESSION: 1. Hypermetabolic left-sided level 2 adenopathy, consistent with nodal metastasis. 2. No extracervical metastatic disease identified. 3. Cirrhosis. 4. Atherosclerosis, including within the coronary arteries. 5. Cholelithiasis.  BP 121/72 mmHg  Pulse 84  Temp(Src) 98.2 F (36.8 C)  Ht 5\' 10"  (1.778 m)  Wt 183 lb 3.2 oz (83.099 kg)  BMI 26.29 kg/m2  SpO2 99%   Wt Readings from Last 3 Encounters:  08/02/15 183 lb 3.2 oz (83.099 kg)  07/31/15 180 lb 6.4 oz (81.829 kg)  07/24/15 176 lb (79.833 kg)   No chief complaint on file.

## 2015-07-30 ENCOUNTER — Encounter: Payer: Self-pay | Admitting: Radiation Oncology

## 2015-07-30 ENCOUNTER — Other Ambulatory Visit (HOSPITAL_COMMUNITY): Payer: Self-pay | Admitting: Oncology

## 2015-07-30 ENCOUNTER — Encounter (HOSPITAL_COMMUNITY)
Admission: RE | Admit: 2015-07-30 | Discharge: 2015-07-30 | Disposition: A | Discharge status: Home / Self Care | Payer: BLUE CROSS/BLUE SHIELD | Source: Ambulatory Visit | Attending: Hematology & Oncology | Admitting: Hematology & Oncology

## 2015-07-30 DIAGNOSIS — C099 Malignant neoplasm of tonsil, unspecified: Secondary | ICD-10-CM | POA: Diagnosis present

## 2015-07-30 LAB — GLUCOSE, CAPILLARY: Glucose-Capillary: 116 mg/dL — ABNORMAL HIGH (ref 65–99)

## 2015-07-30 MED ORDER — FLUDEOXYGLUCOSE F - 18 (FDG) INJECTION
8.7000 | Freq: Once | INTRAVENOUS | Status: AC | PRN
  Administered 2015-07-30: 8.7 via INTRAVENOUS

## 2015-07-31 ENCOUNTER — Encounter (HOSPITAL_BASED_OUTPATIENT_CLINIC_OR_DEPARTMENT_OTHER): Payer: BLUE CROSS/BLUE SHIELD | Admitting: Hematology & Oncology

## 2015-07-31 ENCOUNTER — Encounter (HOSPITAL_COMMUNITY): Payer: Self-pay | Admitting: Hematology & Oncology

## 2015-07-31 VITALS — BP 150/74 | HR 91 | Temp 98.5°F | Resp 18 | Wt 180.4 lb

## 2015-07-31 DIAGNOSIS — F1991 Other psychoactive substance use, unspecified, in remission: Secondary | ICD-10-CM

## 2015-07-31 DIAGNOSIS — Z720 Tobacco use: Secondary | ICD-10-CM

## 2015-07-31 DIAGNOSIS — Z87898 Personal history of other specified conditions: Secondary | ICD-10-CM

## 2015-07-31 DIAGNOSIS — C099 Malignant neoplasm of tonsil, unspecified: Secondary | ICD-10-CM | POA: Diagnosis not present

## 2015-07-31 NOTE — Patient Instructions (Signed)
..  [place] at [facility] Discharge Instructions  RECOMMENDATIONS MADE BY THE CONSULTANT AND ANY TEST RESULTS WILL BE SENT TO YOUR REFERRING PHYSICIAN.  No disease is evident outside of the neck area on the PET HPV positivity is better as a prognostic factor One node is "hot" on the PET scan.  The other one is not so much "hot" We want Dr. Isidore Moos to see you look at this and give her opinion on whether this is N1 disease or N2 disease This will determine if you need chemotherapy along with radiation Mucositis is far worse when chemo and radiation combined. You still may need a feeding tube either way Our goal is to cure, this can only be achieved if you receive treatment on time and this is why nutrition and hydration are very important It is very important that you quit smoking!! We will call you Friday afternoon  Thank you for choosing [place] at [facility] to provide your oncology and hematology care.  To afford each patient quality time with our provider, please arrive at least 15 minutes before your scheduled appointment time.   Beginning January 23rd 2017 lab work for the [facility] will be done in the  Main lab at [facility] on 1st floor. If you have a lab appointment with the [facility] please come in thru the  Main Entrance and check in at the main information desk  You need to re-schedule your appointment should you arrive 10 or more minutes late.  We strive to give you quality time with our providers, and arriving late affects you and other patients whose appointments are after yours.  Also, if you no show three or more times for appointments you may be dismissed from the clinic at the providers discretion.     Again, thank you for choosing [facility].  Our hope is that these requests will decrease the amount of time that you wait before being seen by our physicians.        _____________________________________________________________  Should you have questions after your visit to [facility], please contact our office at [phone] between the hours of 8:30 a.m. and 4:30 p.m.  Voicemails left after 4:30 p.m. will not be returned until the following business day.  For prescription refill requests, have your pharmacy contact our office.         Resources For Cancer Patients and their Caregivers ? American Cancer Society: Can assist with transportation, wigs, general needs, runs Look Good Feel Better.        843-246-5716 ? Cancer Care: Provides financial assistance, online support groups, medication/co-pay assistance.  1-800-813-HOPE 605-803-4677) ? [place] Assists [place] cancer patients and their families through emotional , educational and financial support.  305-543-8672 ? [place] DSS Where to apply for food stamps, Medicaid and utility assistance. 249-219-8522 ? [facility]: Transportation to medical appointments. 218-875-0623 ? Social Security Administration: May apply for disability if have a Stage IV cancer. 657-527-6492 [phone] ? [employer], Disability and Transit Services: Assists with nutrition, care and transit needs. 872-332-3503

## 2015-07-31 NOTE — Progress Notes (Signed)
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 He is seen at [facility], Hepatitis C negative CT neck with contrast 06/25/2015 at [place] level 2 malignant LAD, largest node is 3.5 cm long axis. R worse than L carotid atherosclerosis and stenosis. CT chest with no acute or significant findings within the chest   HISTORY OF PRESENTING ILLNESS:  Frank Castillo 65 y.o. male is here because of referral by Dr. Benjamine Mola for evaluation of Left tonsil invasive squamous cell carcinoma. Left tonsillectomy performed on 07/09/2015 with Dr. Benjamine Mola.  Mr. Rajan is accompanied by his sister, Eustaquio Maize. I personally reviewed and discussed recent PET imaging with the patient and his sister at length.  He is scheduled to see Dr. Isidore Moos of radiation oncology for consultation this Friday, 3/10. He saw Dr. Drexel Iha last Friday, 3/3.   PLAN/RECOMMENDATIONS: 1. I discussed the risks, benefits, and complications of various treatment options with the patient in relationship to his medical and dental conditions, anticipated chemoradiation therapy, and chemoradiation therapy side effects to include xerostomia, radiation caries, trismus, mucositis, taste changes, gum and jawbone changes, and risk for infection and osteoradionecrosis. We discussed various treatment options to include no treatment, extraction of teeth in the primary field radiation therapy, pre-prosthetic surgery as indicated, periodontal therapy, dental restorations, root canal therapy, crown and bridge therapy, implant therapy, and replacement of missing teeth as indicated. We also discussed fabrication of fluoride trays and scatter protection devices. We also discussed  preliminary anticipated ports and doses pending Dr. Lanell Persons full evaluation and a questionable need for dental extractions. At this time, I feel that Dr. Isidore Moos should be able to proceed with radiation therapy without additional dental extractions. The patient currently does NOT wish to have dental extractions at this time if at all possible. He does wish to proceed with impressions today for the fabrication of fluoride trays and scatter guards. He will also follow up with Dr. Buelah Manis for final evaluation for implant therapy he will then follow-up with Dr. Luan Pulling for a dental cleaning and discussion of proceeding with implant therapy at this time or after he has completed his chemoradiation therapy. A prescription for FluoriSHIELD was sent [facility] with refills for one year.   2. Discussion of findings with medical team and coordination of future medical and dental care as needed. I will attempt to contact Dr. Buelah Manis concerning final evaluation of the implant and ability to proceed with definitive crown restoration. I will also contact Dr. Henrietta Dine concerning the need for periodontal therapy at this time and discussion of implant retained crown therapy in the future.   The patient is optimistic about treatment and is not concerned about the placement of a feeding tube. He has been contacted by the patient navigator, Gayleen Orem RN, from radiation oncology.   The patient is here for further discussion of treatment options and review of recent imaging studies.   MEDICAL HISTORY:  Past Medical History  Diagnosis Date  . Tonsillar mass     left  . Hypertension   . Cirrhosis ([place])   . Portal hypertension ([place])     SURGICAL HISTORY: Past Surgical History  Procedure Laterality Date  . Hernia repair Right   . Mouth surgery    . Tonsillectomy Left 07/09/2015  Procedure: LEFT TONSILLECTOMY;  Surgeon: Leta Baptist, MD;  Location: [facility];  Service: ENT;   Laterality: Left;    SOCIAL HISTORY: Social History   Social History  . Marital Status: Single    Spouse Name: N/A  . Number of Children: 0  . Years of Education: N/A   Occupational History  . Not on file.   Social History Main Topics  . Smoking status: Current Every Day Smoker -- 1.00 packs/day    Types: Cigarettes  . Smokeless tobacco: Never Used  . Alcohol Use: No     Comment: former abuse- quit 09/2011  . Drug Use: No  . Sexual Activity: Not on file   Other Topics Concern  . Not on file   Social History Narrative   Divorced.   No children.   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   Currently smoking approximately one pack per day.   Has been smoking off/on since age 14.   Divorced No children. Just step-children. 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 Previously worked in [employer]. Originally from [place], [place]. Current smoker at 1 ppd. Began smoking at [age] yo, regularly smoking at [age] yo. During his "cocaine years" he smoked about 2 ppd. ETOH, recovering alcoholic. 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 He enjoys reading. 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  FAMILY HISTORY: Family History  Problem Relation Age of Onset  . Macular degeneration Mother   . Alcoholism Father    indicated that his mother is alive. He indicated that his father is deceased.   Mother still living at 24 years-old, with macular degeneration. She has no other health issues. Father deceased. Parents divorced at 31 years-old, so he is not sure what he died from. Father was an alcoholic. 4 sisters. 1 sister died of suicide. 1 sister was adopted. 1 Sister lives in [place]. 1 Sister lives in [place].  ALLERGIES:  is allergic to bee venom.  MEDICATIONS:  Current Outpatient Prescriptions  Medication Sig Dispense Refill  . cetirizine (ZYRTEC) 10 MG tablet Take 10 mg by mouth daily.    . cholecalciferol (VITAMIN D) 1000 units tablet Take 1,000 Units by mouth  daily.    Marland Kitchen lisinopril-hydrochlorothiazide (PRINZIDE,ZESTORETIC) 20-25 MG tablet Take 1 tablet by mouth daily.  11  . Melatonin 10 MG TABS Take by mouth.    . oxyCODONE (ROXICODONE) 5 MG/5ML solution Take 5-10 mLs (5-10 mg total) by mouth every 4 (four) hours as needed for severe pain. (Patient not taking: Reported on 07/26/2015) 250 mL 0   No current facility-administered medications for this visit.    Review of Systems  Constitutional: Negative for fever, chills and malaise/fatigue.  HENT: Negative.  Negative for congestion, hearing loss, nosebleeds, sore throat and tinnitus.        L neck mass  Eyes: Negative.  Negative for blurred vision, double vision, pain and discharge.  Respiratory: Negative.  Negative for cough, hemoptysis, sputum production, shortness of breath and wheezing.   Cardiovascular: Negative.  Negative for chest pain, palpitations, claudication, leg swelling and PND.  Gastrointestinal: Negative.  Negative for heartburn, nausea, vomiting, abdominal pain, diarrhea, constipation, blood in stool and melena.  Genitourinary: Negative.  Negative for dysuria, urgency, frequency and hematuria.  Musculoskeletal: Negative.  Negative for myalgias, joint pain and falls.  Skin: Negative.  Negative for itching and rash.  Neurological: Negative.  Negative for dizziness, tingling, tremors, sensory change, speech change, focal weakness, seizures, loss of consciousness, weakness and  headaches.  Endo/Heme/Allergies: Negative.  Does not bruise/bleed easily.  Psychiatric/Behavioral: Negative.  Negative for depression, suicidal ideas, memory loss and substance abuse. The patient is not nervous/anxious and does not have insomnia.   All other systems reviewed and are negative.  14 point ROS was done and is otherwise as detailed above or in HPI   PHYSICAL EXAMINATION: ECOG PERFORMANCE STATUS: 0 - Asymptomatic  Filed Vitals:   07/31/15 1522  BP: 150/74  Pulse: 91  Temp: 98.5 F (36.9 C)    Resp: 18   Filed Weights   07/31/15 1522  Weight: 180 lb 6.4 oz (81.829 kg)    Physical Exam  Constitutional: He is oriented to person, place, and time and well-developed, well-nourished, and in no distress.  HENT:  Head: Normocephalic and atraumatic.  Nose: Nose normal.  Mouth/Throat: Oropharynx is clear and moist. No oropharyngeal exudate.  Eyes: Conjunctivae and EOM are normal. Pupils are equal, round, and reactive to light. Right eye exhibits no discharge. Left eye exhibits no discharge. No scleral icterus.  Neck: Normal range of motion. Neck supple. No tracheal deviation present. No thyromegaly present.  Two palpable lymph nodes, level 2.  Cardiovascular: Normal rate, regular rhythm and normal heart sounds.  Exam reveals no gallop and no friction rub.   No murmur heard. Pulmonary/Chest: Effort normal and breath sounds normal. He has no wheezes. He has no rales.  Abdominal: Soft. Bowel sounds are normal. He exhibits no distension and no mass. There is no tenderness. There is no rebound and no guarding.  Musculoskeletal: Normal range of motion. He exhibits no edema.  Lymphadenopathy:       Head (left side): Submental adenopathy present.    He has no cervical adenopathy.  Neurological: He is alert and oriented to person, place, and time. He has normal reflexes. No cranial nerve deficit. Gait normal. Coordination normal.  Skin: Skin is warm and dry. No rash noted.  Psychiatric: Mood, memory, affect and judgment normal.  Nursing note and vitals reviewed.   LABORATORY DATA:  I have reviewed the data as listed Results for KORBY, RATAY (MRN [id number]) as of 07/31/2015 14:14  Ref. Range 07/04/2015 15:00  Sodium Latest Ref Range: 135-145 mmol/L 136  Potassium Latest Ref Range: 3.5-5.1 mmol/L 4.6  Chloride Latest Ref Range: 101-111 mmol/L 100 (L)  CO2 Latest Ref Range: 22-32 mmol/L 28  BUN Latest Ref Range: 6-20 mg/dL 17  Creatinine Latest Ref Range: 0.61-1.24 mg/dL 0.98  Calcium  Latest Ref Range: 8.9-10.3 mg/dL 9.6  EGFR (Non-African Amer.) Latest Ref Range: >60 mL/min >60  EGFR (African American) Latest Ref Range: >60 mL/min >60  Glucose Latest Ref Range: 65-99 mg/dL 90  Anion gap Latest Ref Range: 5-15  8   RADIOGRAPHIC STUDIES: I have personally reviewed the radiological reports as listed. Study Result     CLINICAL DATA: Initial treatment strategy for primary tonsillar squamous cell carcinoma. Left-sided lung tonsillectomy.  EXAM: NUCLEAR MEDICINE PET SKULL BASE TO THIGH  TECHNIQUE: 8.8 mCi F-18 FDG was injected intravenously. Full-ring PET imaging was performed from the skull base to thigh after the radiotracer. CT data was obtained and used for attenuation correction and anatomic localization.  FASTING BLOOD GLUCOSE: Value: 116 mg/dl  COMPARISON: Clinic note of 07/24/2015. Neck and chest CTs of 06/25/2015.  FINDINGS: NECK  No tonsilar hypermetabolism.  Hypermetabolic left-sided level 2 nodes. Index node measures 2.1 cm and a S.U.V. max of 17.9 on image 25/series 4. 1.9 cm on 06/25/2015.  Immediately inferior node measures 9  mm and a S.U.V. max of 3.8 on image 30/series 4.  CHEST  No areas of abnormal hypermetabolism.  ABDOMEN/PELVIS  No areas of abnormal hypermetabolism.  SKELETON  No abnormal marrow activity.  CT IMAGES PERFORMED FOR ATTENUATION CORRECTION  carotid atherosclerosis. Mild cardiomegaly with multivessel coronary artery atherosclerosis. Mild centrilobular emphysema. Mild bibasilar atelectasis. Normal adrenal glands. Gallstones and a contracted gallbladder. Moderate cirrhosis. Advanced abdominal aortic atherosclerosis. Scattered colonic diverticula. Mild prostatomegaly. Fat containing left inguinal hernia. Prior right inguinal hernia repair.  IMPRESSION: 1. Hypermetabolic left-sided level 2 adenopathy, consistent with nodal metastasis. 2. No extracervical metastatic disease identified. 3.  Cirrhosis. 4. Atherosclerosis, including within the coronary arteries. 5. Cholelithiasis.   Electronically Signed  By: Abigail Miyamoto M.D.  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 He is seen at [facility], Hepatitis C negative CT neck with contrast 06/25/2015 at [place] level 2 malignant LAD, largest node is 3.5 cm long axis. R worse than L carotid atherosclerosis and stenosis. CT chest with no acute or significant findings within the chest [id number]  I personally reviewed and went over pathology results, new diagnosis, and imaging studies with the patient at length. I suspect N2 disease with a probable T2 primary, based upon NCCN guidelines chemoradiotherapy would be recommended.   We discussed the role of concurrent therapy in treating his disease. I advised the patient that if concurrent treatment is pursued he will need a temporary FT. In addition he will need speech therapy consultation and nutrition consultation. We did discuss this at length and he understands and agrees to whatever is recommended to treat his malignancy. I advised him that additional and final recommendations will be made after he sees Dr. Isidore Moos.  We have discussed P16 positivity, again I advised him that there are no current recommendations to alter therapy. In addition his smoking makes him higher risk.  I addressed the importance of smoking cessation with the patient in detail.  We discussed the health benefits of cessation.  We discussed the health detriments of ongoing tobacco use including but not limited to COPD, heart disease and malignancy. We reviewed the multiple options for cessation. We discussed other alternatives to quit such as chantix, wellbutrin. We will continue to address this moving forward.  Plan will be for return in approximately 1 week.  All  questions were answered. The patient knows to call the clinic with any problems, questions or concerns.  This document serves as a record of services personally performed by Ancil Linsey, MD. It was created on her behalf by Arlyce Harman, a trained medical scribe. The creation of this record is based on the scribe's personal observations and the provider's statements to them. This document has been checked and approved by the attending provider.  I have reviewed the above documentation for accuracy and completeness, and I agree with the above.  This note was electronically signed.    Molli Hazard, MD  07/31/2015 3:51 PM

## 2015-08-02 ENCOUNTER — Ambulatory Visit
Admission: RE | Admit: 2015-08-02 | Discharge: 2015-08-02 | Disposition: A | Discharge status: Home / Self Care | Payer: BLUE CROSS/BLUE SHIELD | Source: Ambulatory Visit | Attending: Radiation Oncology | Admitting: Radiation Oncology

## 2015-08-02 ENCOUNTER — Encounter: Payer: Self-pay | Admitting: Radiation Oncology

## 2015-08-02 ENCOUNTER — Encounter: Payer: Self-pay | Admitting: *Deleted

## 2015-08-02 VITALS — BP 121/72 | HR 84 | Temp 98.2°F | Ht 70.0 in | Wt 183.2 lb

## 2015-08-02 DIAGNOSIS — C090 Malignant neoplasm of tonsillar fossa: Secondary | ICD-10-CM | POA: Diagnosis present

## 2015-08-02 DIAGNOSIS — C099 Malignant neoplasm of tonsil, unspecified: Secondary | ICD-10-CM | POA: Insufficient documentation

## 2015-08-02 HISTORY — DX: Unspecified cirrhosis of liver: K74.60

## 2015-08-02 HISTORY — DX: Portal hypertension: K76.6

## 2015-08-02 MED ORDER — NICOTINE 21 MG/24HR TD PT24: MEDICATED_PATCH | TRANSDERMAL | Status: DC

## 2015-08-02 MED ORDER — NICOTINE 7 MG/24HR TD PT24: MEDICATED_PATCH | TRANSDERMAL | Status: DC

## 2015-08-02 MED ORDER — NICOTINE 14 MG/24HR TD PT24: MEDICATED_PATCH | TRANSDERMAL | Status: DC

## 2015-08-02 NOTE — Addendum Note (Signed)
Encounter addended by: Ernst Spell, RN on: 08/02/2015  9:42 AM<BR>     Documentation filed: Charges VN

## 2015-08-02 NOTE — Progress Notes (Signed)
  Oncology Nurse Navigator Documentation  Navigator Location: [facility]-Med Onc (08/02/15 5909) Navigator Encounter Type: Initial RadOnc (08/02/15 0755)               Barriers/Navigation Needs: No barriers at this time (08/02/15 0755)   Interventions: None required (08/02/15 0755)     Met with patient during initial consult with Dr. Isidore Moos.  He was accompanied by his sister Eustaquio Maize. 1. Further introduced myself as his Navigator, explained my role as a member of the Care Team.   2. Provided New Patient Information packet, discussed contents:  Contact information for physician(s), myself, other members of the Care Team.  Advance Directive information ([place] blue pamphlet with LCSW contact info).  Provided him Advance Directive booklet.  Fall Prevention Patient Safety Plan  Appointment Guideline  Financial Assistance Information sheet  [place] with highlight of [place] 3. Provided introductory explanation of radiation treatment including SIM planning and purpose of Aquaplast head and shoulder mask, showed them example.   4. Provided photos/diagrams of feeding tube and PAC, explained their purpose. 5. Provided a tour of SIM and [place] areas, explained treatment and arrival procedures. 6. He will receive support services (nutrition, SLP, PT, SW) at [facility] d/t proximity to his workplace. 7. He agreed to accept a mentor while being treated.  I will follow-up with an assignment. 8. I encouraged them to contact me with questions/concerns as treatments/procedures begin.  They verbalized understanding of information provided.    Gayleen Orem, RN, BSN, [place] at [place] [phone]                      Time Spent with Patient: 120 (08/02/15 0755)

## 2015-08-02 NOTE — Progress Notes (Addendum)
Initial outpatient Consultation   Name: Frank Castillo         MRN: [id number]        Date: 08/02/2015                      DOB: [birth year]   CC:No primary care provider on file.  [place], Kelby Fam, MD    REFERRING PHYSICIAN: Patrici Ranks, MD   DIAGNOSIS: Left Tonsil cancer, Stage IVA [id number]      ICD-9-CM ICD-10-CM   1. Cancer of tonsillar fossa (HCC) 146.1 C09.0 nicotine (NICODERM CQ) 21 mg/24hr patch     nicotine (NICODERM CQ) 7 mg/24hr patch     nicotine (NICODERM CQ) 14 mg/24hr patch      HISTORY OF PRESENT ILLNESS::[name] is a 65 y.o. male who felt that his neck was asymmetric and saw his PCP. One week later, he was set up with a sonogram and CT scan at [facility] which showed Left Tonsil mass that was just over 2 cm with left neck adenopathy. Cells were strongly p16 positive.    Subsequently, the patient saw Dr. Benjamine Castillo who biopsied the left tonsil (tonsillectomy) on 07-09-15.   Biopsy of left tonsil revealed: INVASIVE SQUAMOUS CELL CARCINOMA, MODERATELY DIFFERENTIATED   Pertinent imaging thus far includes PET performed on 07-30-15 revealing Hypermetabolic left-sided level 2 adenopathy, (at least 2 left neck nodes), consistent with nodal metastasis. No extracervical metastatic disease identified.   Swallowing issues, if any: denies   Weight Changes: yes, a little after biopsy, but regaining since then   Pain status: none   Other symptoms: Slight difficulty swallowing since tonsillectomy - subtle   Tobacco history, if any: 1ppd since teenager years.  2ppd during his "cocaine years." The patient is still smoking.   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      PAST SURGICAL HISTORY: Past Surgical History   Procedure  Laterality  Date   .  Hernia repair  Right     .   Mouth surgery       .  Tonsillectomy  Left  07/09/2015       Procedure: LEFT TONSILLECTOMY;  Surgeon: Frank Baptist, MD;  Location: [place];  Service: ENT;  Laterality: Left;    FAMILY HISTORY: family history includes Alcoholism in his father; Macular degeneration in his mother.   SOCIAL HISTORY: reports that he has been smoking Cigarettes.  He has been smoking about 1.00 pack per day. He has never used smokeless tobacco. He reports that he does not drink alcohol or use illicit drugs.   ALLERGIES: Bee venom   MEDICATIONS:  Current Outpatient Prescriptions   Medication  Sig  Dispense  Refill   .  cetirizine (ZYRTEC) 10 MG tablet  Take 10 mg by mouth daily.       .  cholecalciferol (VITAMIN D) 1000 units tablet  Take 1,000 Units by mouth daily.       Marland Kitchen  lisinopril-hydrochlorothiazide (PRINZIDE,ZESTORETIC) 20-25 MG tablet  Take 1 tablet by mouth daily.    11   .  Melatonin 10 MG TABS  Take by mouth.       .  oxyCODONE (ROXICODONE) 5 MG/5ML solution  Take 5-10 mLs (5-10 mg total) by mouth every 4 (four) hours as needed for severe pain. (Patient not taking:  Reported on 07/26/2015)  250 mL  0       No current facility-administered medications for this encounter.     REVIEW OF SYSTEMS:  Notable for that above.    PHYSICAL EXAM: height is 5\' 10"  (1.778 m) and weight is 183 lb 3.2 oz (83.099 kg). His temperature is 98.2 F (36.8 C). His blood pressure is 121/72 and his pulse is 84. His oxygen saturation is 99%.  General: Alert and oriented, in no acute distress HEENT: Head is normocephalic. Extraocular muscles are intact. Granulation tissue is noted in the tonsular fossa on the left. Otherwise, no lesions are noted. Neck: Neck is notable for 2 cm mass at the angle of the left mandible. No other masses are noted in the cervical and supraclavicular regions. Heart: Regular in rate and rhythm with no murmurs, rubs, or gallops. Chest: Clear to auscultation bilaterally, with no rhonchi,  wheezes, or rales.  Abdomen: Soft, nontender, nondistended, with no rigidity or guarding. Extremities: No cyanosis or edema. Lymphatics: see Neck Exam Skin: No concerning lesions. Musculoskeletal: symmetric strength and muscle tone throughout. Neurologic: Cranial nerves II through XII are grossly intact. No obvious focalities. Speech is fluent. Coordination is intact. Psychiatric: Judgment and insight are intact. Affect is appropriate.   ECOG = 0   0 - Asymptomatic (Fully active, able to carry on all predisease activities without restriction)   1 - Symptomatic but completely ambulatory (Restricted in physically strenuous activity but ambulatory and able to carry out work of a light or sedentary nature. For example, light housework, office work)   2 - Symptomatic, <50% in bed during the day (Ambulatory and capable of all self care but unable to carry out any work activities. Up and about more than 50% of waking hours)   3 - Symptomatic, >50% in bed, but not bedbound (Capable of only limited self-care, confined to bed or chair 50% or more of waking hours)   4 - Bedbound (Completely disabled. Cannot carry on any self-care. Totally confined to bed or chair)   5 - Death    Frank Castillo MM, Frank Castillo, Frank Castillo, et al. [phone]). "Toxicity and response criteria of the [facility]". [place] Oncol. 5 (6): 649-55     LABORATORY DATA:    Recent Labs  Lab Results   Component  Value  Date     WBC  9.0  10/21/2011     HGB  11.5*  10/21/2011     HCT  34.6*  10/21/2011     MCV  101*  10/21/2011     PLT  209  10/21/2011      CMP      Labs (Brief)     Component  Value  Date/Time     NA  136  07/04/2015 1500     NA  132*  10/21/2011 1614     K  4.6  07/04/2015 1500     K  3.6  10/21/2011 1614     CL  100*  07/04/2015 1500     CL  92*  10/21/2011 1614     CO2  28  07/04/2015 1500     CO2  32  10/21/2011 1614     GLUCOSE  90  07/04/2015 1500     GLUCOSE  105*   10/21/2011 1614     BUN  17  07/04/2015 1500     BUN  7  10/21/2011 1614     CREATININE  0.98  07/04/2015 1500     CREATININE  0.73  10/21/2011 1614     CALCIUM  9.6  07/04/2015 1500     CALCIUM  7.9*  10/21/2011 1614     PROT  6.0*  10/21/2011 1614     ALBUMIN  2.2*  10/21/2011 1614     AST  97*  10/21/2011 1614     ALT  34  10/21/2011 1614     ALKPHOS  83  10/21/2011 1614     BILITOT  1.1*  10/21/2011 1614     GFRNONAA  >60  07/04/2015 1500     GFRNONAA  >60  10/21/2011 1614     GFRAA  >60  07/04/2015 1500     GFRAA  >60  10/21/2011 1614     Lab Results  Component Value Date   TSH 2.45 10/20/2011       Imaging Results  RADIOGRAPHY: Nm Pet Image Initial (pi) Skull Base To Thigh   07/30/2015  CLINICAL DATA:  Initial treatment strategy for primary tonsillar squamous cell carcinoma. Left-sided lung tonsillectomy. EXAM: NUCLEAR MEDICINE PET SKULL BASE TO THIGH TECHNIQUE: 8.8 mCi F-18 FDG was injected intravenously. Full-ring PET imaging was performed from the skull base to thigh after the radiotracer. CT data was obtained and used for attenuation correction and anatomic localization. FASTING BLOOD GLUCOSE:  Value: 116 mg/dl COMPARISON:  Clinic note of 07/24/2015. Neck and chest CTs of 06/25/2015. FINDINGS: NECK No tonsilar hypermetabolism. Hypermetabolic left-sided level 2 nodes. Index node measures 2.1 cm and a S.U.V. max of 17.9 on image 25/series 4. 1.9 cm on 06/25/2015. Immediately inferior node measures 9 mm and a S.U.V. max of 3.8 on image 30/series 4. CHEST No areas of abnormal hypermetabolism. ABDOMEN/PELVIS No areas of abnormal hypermetabolism. SKELETON No abnormal marrow activity. CT IMAGES PERFORMED FOR ATTENUATION CORRECTION carotid atherosclerosis. Mild cardiomegaly with multivessel coronary artery atherosclerosis. Mild centrilobular emphysema. Mild bibasilar atelectasis. Normal adrenal glands. Gallstones and a contracted gallbladder. Moderate cirrhosis. Advanced abdominal aortic  atherosclerosis. Scattered colonic diverticula. Mild prostatomegaly. Fat containing left inguinal hernia. Prior right inguinal hernia repair. IMPRESSION: 1. Hypermetabolic left-sided level 2 adenopathy, consistent with nodal metastasis. 2. No extracervical metastatic disease identified. 3. Cirrhosis. 4.  Atherosclerosis, including within the coronary arteries. 5. Cholelithiasis. Electronically Signed   By: Abigail Miyamoto M.D.   On: 07/30/2015 09:56        IMPRESSION/PLAN:  This is a delightful patient with head and neck cancer. I did recommend  radiation to this patient. I will discuss this with Dr. Whitney Muse to see if she would like to move forward with the chemotherapy portion of this suggested treatment. Radiation treatment would occur in 35 treatments over a 7 week span. I will be sending a prescription for Nicotine patches to help with smoking addiction. He has an appointment with Dr. Whitney Muse scheduled for 08/16/2015. I think chemotherapy should be strongly considered if patient is healthy enough for it (history of cirrhosis.)   We discussed the potential risks, benefits, and side effects of radiotherapy. We talked in detail about acute and late effects. We discussed that some of the most bothersome acute effects may be mucositis, dysgeusia, salivary changes, skin irritation, hair loss, dehydration, weight loss and fatigue. We talked about late effects which include but are not necessarily limited to dysphagia, hypothyroidism, nerve injury, spinal cord injury, xerostomia, trismus, and neck edema. No guarantees of treatment were given. A consent form was signed and placed in the patient's medical record. The patient is enthusiastic  about proceeding with treatment. I look forward to participating in the patient's care. The patient has signed informed consent for radiation treatment.   Simulation (treatment planning) will take place once his scatter guards are ready per Dr Enrique Sack.   We also discussed  that the treatment of head and neck cancer is a multidisciplinary process to maximize treatment outcomes and quality of life. For this reasons the following referrals have been or will be made:   He has seen Dr. Whitney Muse of Medical oncology to discuss chemotherapy and will discuss this further w/ her. He will need a feeding tube if he receives chemotherapy. I am not sure how bad his cirrhosis is, which might impact her recommendations.  Recent labs for liver function are not available to me.   He has seen Dentistry for dental evaluation, possible extractions in the radiation fields, and /or advice on reducing risk of cavities, osteoradionecrosis, or other oral issues. Will probably not need extractions. He will follow up with Dr. Buelah Manis for final evaluation for implant therapy he will then follow-up with Dr. Luan Pulling for a dental cleaning and discussion of proceeding with implant therapy at this time or after he has completed his chemoradiation therapy. A prescription for FluoriSHIELD was sent [facility] with refills for one year by Dr Enrique Sack and he had scatter guards made as well for RT.   Dr. Whitney Muse will refer him to SLP, nutrition, and social work at [employer]. He has a history of drug abuse and takes care of his mother - certainly will benefit from social work support.  Regardless of whether he receives chemotherapy, Dr. Whitney Muse and her team will follow with the patient closely for supportive care and pain medication prescriptions, per my discussion w/ her over the phone today.   The patient continues to use tobacco. The patient was counseled to stop using tobacco and was offered pharmacotherapy and further counseling to help with this. The patient accepts a prescription for nicotine patches today but declines a prescription for Wellbutrin. Our patient navigator will talk to him about counseling programs for smoking cessation.   I will ask Dr. Whitney Muse to order a TSH w/ his  baseline labs.  Gayleen Orem, RN, our Head and Neck Oncology Navigator will refer him to a mentor with a history of head and neck cancer. Patient enthusiastic about this. __________________________________________    Eppie Gibson, MD   This document serves as a record of services personally performed by Eppie Gibson, MD. It was created on her behalf by Jenell Milliner, a trained medical scribe. The creation of this record is based on the scribe's personal observations and the provider's statements to them. This document has been checked and approved by the attending provider

## 2015-08-05 ENCOUNTER — Encounter (HOSPITAL_COMMUNITY): Payer: Self-pay | Admitting: Dentistry

## 2015-08-05 ENCOUNTER — Encounter (INDEPENDENT_AMBULATORY_CARE_PROVIDER_SITE_OTHER): Payer: Self-pay

## 2015-08-05 ENCOUNTER — Ambulatory Visit (HOSPITAL_COMMUNITY): Payer: Self-pay | Admitting: Dentistry

## 2015-08-05 VITALS — BP 154/73 | HR 85 | Temp 98.1°F

## 2015-08-05 DIAGNOSIS — Z463 Encounter for fitting and adjustment of dental prosthetic device: Secondary | ICD-10-CM | POA: Diagnosis not present

## 2015-08-05 DIAGNOSIS — Z01818 Encounter for other preprocedural examination: Secondary | ICD-10-CM

## 2015-08-05 DIAGNOSIS — C099 Malignant neoplasm of tonsil, unspecified: Secondary | ICD-10-CM

## 2015-08-05 MED ORDER — SODIUM FLUORIDE 1.1 % DT GEL: DENTAL | Status: DC

## 2015-08-05 MED FILL — FLUORISHIELD 1.1% GEL: 1.1 % | 30 days supply | Qty: 114 | Fill #0

## 2015-08-05 NOTE — Patient Instructions (Signed)
FLUORIDE TRAYS PATIENT INSTRUCTIONS    Obtain prescription from the pharmacy.  Don't be surprised if it needs to be ordered.  Be sure to let the pharmacy know when you are close to needing a new refill for them to have it ready for you without interruption of Fluoride use.  The best time to use your Fluoride is before bed time.  You must brush your teeth very well and floss before using the Fluoride in order to get the best use out of the Fluoride treatments.  Place 1 drop of Fluoride gel per tooth in the tray.  Place the tray on your lower teeth and your upper teeth.  Make sure the trays are seated all the way.  Remember, they only fit one way on your teeth.  Insert for 5 full minutes.  At the end of the 5 minutes, take the trays out.  SPIT OUT excess. .  Do NOT rinse your mouth!  Do NOT eat or drink after treatments for at least 30 minutes.  This is why the best time for your treatments is before bedtime.  Clean the inside of your Fluoride trays using COLD WATER and a toothbrush.  In order to keep your Trays from discoloring and free from odors, soak them overnight in denture cleaners such as Efferdent.  Do not use bleach or non denture products.  Store the trays in a safe dry place AWAY from any heat until your next treatment.  If anything happens to your Fluoride trays, or they don't fit as well after any dental work, please let us know as soon as possible.  TRISMUS  Trismus is a condition where the jaw does not allow the mouth to open as wide as it usually does.  This can happen almost suddenly, or in other cases the process is so slow, it is hard to notice it-until it is too far along.  When the jaw joints and/or muscles have been exposed to radiation treatments, the onset of Trismus is very slow.  This is because the muscles are losing their stretching ability over a long period of time, as long as 2 YEARS after the end of radiation.  It is therefore important to exercise  these muscles and joints.  TRISMUS EXERCISES   Stack of tongue depressors measuring the same or a little less than the last documented MIO (Maximum Interincisal Opening).  Secure them with a rubber band on both ends.  Place the stack in the patient's mouth, supporting the other end.  Allow 30 seconds for muscle stretching.  Rest for a few seconds.  Repeat 3-5 times  For all radiation patients, this exercise is recommended in the mornings and evenings unless otherwise instructed.  The exercise should be done for a period of 2 YEARS after the end of radiation.  MIO should be checked routinely on recall dental visits by the general dentist or the hospital dentist.  The patient is advised to report any changes, soreness, or difficulties encountered when doing the exercises. 

## 2015-08-05 NOTE — Progress Notes (Signed)
08/05/2015  Patient Name:   Frank Castillo Date of Birth:   05/25/1951 Medical Record Number: [id number]  BP 154/73 mmHg  Pulse 85  Temp(Src) 98.1 F (36.7 C) (Oral)  [name] now presents for insertion of upper and lower fluoride trays and scatter protection devices. We discussed anticipated ports and doses and doses of less than 5000 cGy to tooth numbers 2, 15, and 19 per communication with with Dr. Isidore Moos. We also discussed need for evaluation of implant by Dr. Tamela Oddi (oral surgeon) for evaluation of implant as soon as possible. Patient indicates that he is scheduled to see Dr. Buelah Manis tomorrow afternoon.  PROCEDURE: Appliances were tried in and adjusted as needed. [place] Trismus device was previously fabricated 45 mm using 27 sticks. Postop instructions were provided and a written and verbal format concerning the use and care of appliances. All questions were answered. Patient to return to clinic for oral examination in approximately 2-3 weeks during radiation therapy. Patient to call if questions or problems arise before then.  Lenn Cal, DDS

## 2015-08-06 ENCOUNTER — Telehealth: Payer: Self-pay | Admitting: *Deleted

## 2015-08-06 NOTE — Telephone Encounter (Signed)
  Oncology Nurse Navigator Documentation  Navigator Location: [facility] (08/06/15 [id number]) Navigator Encounter Type: Telephone (08/06/15 0951) Telephone: Incoming Call;Patient Update (08/06/15 0951)             Barriers/Navigation Needs: Coordination of Care (08/06/15 [id number])   Interventions: Coordination of Care (08/06/15 [id number])     Received call from Frank Castillo.  He indicated after further consideration of his dual treatment plan of XRT and chemotherapy, he has decided to have XRT in [place] since he will be receiving chemo with Dr. Whitney Muse and support services at [facility].   I acknowledged his decision, indicated I will notify Dr. Isidore Moos so his CT [facility] can be re-scheduled at [facility].  Gayleen Orem, RN, BSN, [place] at [place] [phone]                      Time Spent with Patient: 15 (08/06/15 0951)

## 2015-08-07 ENCOUNTER — Other Ambulatory Visit (HOSPITAL_COMMUNITY): Payer: Self-pay | Admitting: Hematology & Oncology

## 2015-08-09 ENCOUNTER — Ambulatory Visit: Payer: BLUE CROSS/BLUE SHIELD

## 2015-08-09 ENCOUNTER — Ambulatory Visit
Admission: RE | Admit: 2015-08-09 | Payer: BLUE CROSS/BLUE SHIELD | Source: Ambulatory Visit | Admitting: Radiation Oncology

## 2015-08-13 ENCOUNTER — Telehealth: Payer: Self-pay | Admitting: *Deleted

## 2015-08-13 NOTE — Telephone Encounter (Signed)
  Oncology Nurse Navigator Documentation  Navigator Location: [facility] (08/13/15 0954) Navigator Encounter Type: Telephone (08/13/15 0954) Telephone: Outgoing Call (08/13/15 [id number])                 Interventions: Talk to a survivor (08/13/15 0954)       In follow-up to my VMM of last week, called Mr. Dimartino to provide information re his mentor, Von Bowen who I had contacted last week Thursday.  He indicated he had met with Von over the weekend. He stated their first meeting was very good, he feels that Von will provide good support.  I explained to Mr. Borum that I will contact him periodically during his tmt for additional feedback re his mentoring relationship.  He voiced understanding.  Though he is receiving his tmts in [facility] and is being supported by navigator Lupita Raider, I assured him I was available if needed.  He expressed appreciation.  Gayleen Orem, RN, BSN, [place] at [place] [phone]                    Time Spent with Patient: 15 (08/13/15 0954)

## 2015-08-14 ENCOUNTER — Other Ambulatory Visit (HOSPITAL_COMMUNITY): Payer: Self-pay | Admitting: *Deleted

## 2015-08-14 DIAGNOSIS — C099 Malignant neoplasm of tonsil, unspecified: Secondary | ICD-10-CM

## 2015-08-15 MED ORDER — ONDANSETRON HCL 8 MG PO TABS: 8.0000 mg | ORAL_TABLET | Freq: Three times a day (TID) | ORAL | Status: DC | PRN

## 2015-08-15 MED ORDER — PROCHLORPERAZINE MALEATE 10 MG PO TABS: 10.0000 mg | ORAL_TABLET | Freq: Four times a day (QID) | ORAL | Status: DC | PRN

## 2015-08-15 MED ORDER — LIDOCAINE-PRILOCAINE 2.5-2.5 % EX CREA: TOPICAL_CREAM | CUTANEOUS | Status: DC

## 2015-08-15 NOTE — Patient Instructions (Addendum)
[facility]   CHEMOTHERAPY INSTRUCTIONS  Premeds: Aloxi - for nausea/vomiting prevention/reduction. Emend - for nausea/vomiting prevention/reduction. Dexamethasone - steroid - given to reduce the risk of you having an allergic type reaction to the chemotherapy. Dex can cause you to feel energized, nervous/anxious/jittery, make you have trouble sleeping, and/or make you feel hot/flushed in the face/neck and/or look pink/red in the face/neck. These side effects will pass as the Dex wears off. (takes 30 minutes to infuse)   Cisplatin - this medication is hard on your kidneys. This is why we give you a large amount of fluids through your IV while you are here getting chemo. We also need you drinking 64 oz of fluid (preferably water/decaff fluids) everyday. Drink more if you can. This will help keep your kidneys flushed. This can also cause acute and/or delayed nausea/vomiting. You must take your nausea meds as prescribed if you get nauseated. Do not wait until you start vomiting. This med can also cause peripheral neuropathy (numbness/tingling/burning in hands/fingers/feet/toes). Let us know if this develops so that we can monitor it and treat if necessary. (Takes 1 hour to infuse)   Neulasta - this medication is not chemo but being given because you have had chemo. It is usually given 27 hours after the completion of chemotherapy. This medication works by boosting your bone marrow's supply of white blood cells. White blood cells are what protect our bodies against infection. The medication is given in the form of a subcutaneous injection. It is given in the fatty tissue of your abdomen. It is a short needle. The major side effect of this medication is bone or muscle pain. The drug of choice to relieve or lessen the pain is Aleve or Ibuprofen. If a physician has ever told you not to take Aleve or Ibuprofen - then don't take it. You should then take Tylenol/acetaminophen. Take  either medication as the bottle directs you to.  The level of pain you experience as a result of this injection can range from none, to mild or moderate, or severe. Please let us know if you develop moderate or severe bone pain.     POTENTIAL SIDE EFFECTS OF TREATMENT: Increased Susceptibility to Infection, Vomiting, Constipation, Hair Thinning, Changes in Character of Skin and Nails (brittleness, dryness,etc.), Bone Marrow Suppression, Nausea, Diarrhea, Sun Sensitivity and Mouth Sores   EDUCATIONAL MATERIALS GIVEN AND REVIEWED: Chemotherapy and You booklet Specific Instructions Sheets: Cisplatin, Neulasta, Aloxi, Emend, Dexamethasone, Zofran, Compazine, EMLA cream   SELF CARE ACTIVITIES WHILE ON CHEMOTHERAPY:  Increase your fluid intake 48 hours prior to treatment and drink at least 2 quarts per day after treatment., No alcohol intake., No aspirin or other medications unless approved by your oncologist., Eat foods that are light and easy to digest., Eat foods at cold or room temperature., No fried, fatty, or spicy foods immediately before or after treatment., Have teeth cleaned professionally before starting treatment. Keep dentures and partial plates clean., Use soft toothbrush and do not use mouthwashes that contain alcohol. Biotene is a good mouthwash that is available at most pharmacies or may be ordered by calling [phone]., Use warm salt water gargles (1 teaspoon salt per 1 quart warm water) before and after meals and at bedtime. Or you may rinse with 2 tablespoons of three -percent hydrogen peroxide mixed in eight ounces of water., Always use sunscreen with SPF (Sun Protection Factor) of 30 or higher., Use your nausea medication as directed to prevent nausea., Use your  stool softener or laxative as directed to prevent constipation. and Use your anti-diarrheal medication as directed to stop diarrhea.  Please wash your hands for at least 30 seconds using warm soapy water. Handwashing is  the #1 way to prevent the spread of germs. Stay away from sick people or people who are getting over a cold. If you develop respiratory systems such as green/yellow mucus production or productive cough or persistent cough let us know and we will see if you need an antibiotic. It is a good idea to keep a pair of gloves on when going into grocery stores/[employer] to decrease your risk of coming into contact with germs on the carts, etc. Carry alcohol hand gel with you at all times and use it frequently if out in public. All foods need to be cooked thoroughly. No raw foods. No medium or undercooked meats, eggs. If your food is cooked medium well, it does not need to be hot pink or saturated with bloody liquid at all. Vegetables and fruits need to be washed/rinsed under the faucet with a dish detergent before being consumed. You can eat raw fruits and vegetables unless we tell you otherwise but it would be best if you cooked them or bought frozen. Do not eat off of salad bars or hot bars unless you really trust the cleanliness of the restaurant. If you need dental work, please let Dr. Whitney Muse know before you go for your appointment so that we can coordinate the best possible time for you in regards to your chemo regimen. You need to also let your dentist know that you are actively taking chemo. We may need to do labs prior to your dental appointment. We also want your bowels moving at least every other day. If this is not happening, we need to know so that we can get you on a bowel regimen to help you go.    MEDICATIONS: You have been given prescriptions for the following medications:  Zofran/Ondansetron 8mg  tablet. Take 1 tablet every 8 hours as needed for nausea/vomiting. (#1 nausea med to take, this can constipate)  Compazine/Prochlorperazine 10mg  tablet. Take 1 tablet every 6 hours as needed for nausea/vomiting. (#2 nausea med to take, this can make you sleepy)  EMLA cream. Apply a quarter size amount to  port site 1 hour prior to chemo. Do not rub in. Cover with plastic wrap.   SYMPTOMS TO REPORT AS SOON AS POSSIBLE AFTER TREATMENT:  FEVER GREATER THAN 100.5 F  CHILLS WITH OR WITHOUT FEVER  NAUSEA AND VOMITING THAT IS NOT CONTROLLED WITH YOUR NAUSEA MEDICATION  UNUSUAL SHORTNESS OF BREATH  UNUSUAL BRUISING OR BLEEDING  TENDERNESS IN MOUTH AND THROAT WITH OR WITHOUT PRESENCE OF ULCERS  URINARY PROBLEMS  BOWEL PROBLEMS  UNUSUAL RASH    Wear comfortable clothing and clothing appropriate for easy access to any Portacath or PICC line. Let us know if there is anything that we can do to make your therapy better!      I have been informed and understand all of the instructions given to me and have received a copy. I have been instructed to call the clinic [phone] or my family physician as soon as possible for continued medical care, if indicated. I do not have any more questions at this time but understand that I may call the [facility] or the Patient Navigator at [phone] during office hours should I have questions or need assistance in obtaining follow-up care.  Palonosetron Injection What is this medicine? PALONOSETRON (pal oh NOE se tron) is used to prevent nausea and vomiting caused by chemotherapy. It also helps prevent delayed nausea and vomiting that may occur a few days after your treatment. This medicine may be used for other purposes; ask your health care provider or pharmacist if you have questions. What should I tell my health care provider before I take this medicine? They need to know if you have any of these conditions: -an unusual or allergic reaction to palonosetron, dolasetron, granisetron, ondansetron, other medicines, foods, dyes, or preservatives -pregnant or trying to get pregnant -breast-feeding How should I use this medicine? This medicine is for infusion into a vein. It is given by a health care professional in a hospital or clinic  setting. Talk to your pediatrician regarding the use of this medicine in children. While this drug may be prescribed for children as young as 1 month for selected conditions, precautions do apply. Overdosage: If you think you have taken too much of this medicine contact a poison control center or emergency room at once. NOTE: This medicine is only for you. Do not share this medicine with others. What if I miss a dose? This does not apply. What may interact with this medicine? -certain medicines for depression, anxiety, or psychotic disturbances -fentanyl -linezolid -MAOIs like Carbex, Eldepryl, Marplan, Nardil, and Parnate -methylene blue (injected into a vein) -tramadol This list may not describe all possible interactions. Give your health care provider a list of all the medicines, herbs, non-prescription drugs, or dietary supplements you use. Also tell them if you smoke, drink alcohol, or use illegal drugs. Some items may interact with your medicine. What should I watch for while using this medicine? Your condition will be monitored carefully while you are receiving this medicine. What side effects may I notice from receiving this medicine? Side effects that you should report to your doctor or health care professional as soon as possible: -allergic reactions like skin rash, itching or hives, swelling of the face, lips, or tongue -breathing problems -confusion -dizziness -fast, irregular heartbeat -fever and chills -loss of balance or coordination -seizures -sweating -swelling of the hands and feet -tremors -unusually weak or tired Side effects that usually do not require medical attention (report to your doctor or health care professional if they continue or are bothersome): -constipation or diarrhea -headache This list may not describe all possible side effects. Call your doctor for medical advice about side effects. You may report side effects to FDA at 1-800-FDA-1088. Where  should I keep my medicine? This drug is given in a hospital or clinic and will not be stored at home. NOTE: This sheet is a summary. It may not cover all possible information. If you have questions about this medicine, talk to your doctor, pharmacist, or health care provider.    2016, Elsevier/[employer] Standard. (2013-03-17 10:38:36) Fosaprepitant injection What is this medicine? FOSAPREPITANT (fos ap RE pi tant) is used together with other medicines to prevent nausea and vomiting caused by cancer treatment (chemotherapy). This medicine may be used for other purposes; ask your health care provider or pharmacist if you have questions. What should I tell my health care provider before I take this medicine? They need to know if you have any of these conditions: -liver disease -an unusual or allergic reaction to fosaprepitant, aprepitant, medicines, foods, dyes, or preservatives -pregnant or trying to get pregnant -breast-feeding How should I use this medicine? This medicine is for injection into a vein.  It is given by a health care professional in a hospital or clinic setting. Talk to your pediatrician regarding the use of this medicine in children. Special care may be needed. Overdosage: If you think you have taken too much of this medicine contact a poison control center or emergency room at once. NOTE: This medicine is only for you. Do not share this medicine with others. What if I miss a dose? This does not apply. What may interact with this medicine? Do not take this medicine with any of these medicines: -cisapride -flibanserin -lomitapide -pimozide This medicine may also interact with the following medications: -diltiazem -male hormones, like estrogens or progestins and birth control pills -medicines for fungal infections like ketoconazole and itraconazole -medicines for HIV -medicines for seizures or to control epilepsy like carbamazepine or phenytoin -medicines used for sleep or  anxiety disorders like alprazolam, diazepam, or midazolam -nefazodone -paroxetine -ranolazine -rifampin -some chemotherapy medications like etoposide, ifosfamide, vinblastine, vincristine -some antibiotics like clarithromycin, erythromycin, troleandomycin -steroid medicines like dexamethasone or methylprednisolone -tolbutamide -warfarin This list may not describe all possible interactions. Give your health care provider a list of all the medicines, herbs, non-prescription drugs, or dietary supplements you use. Also tell them if you smoke, drink alcohol, or use illegal drugs. Some items may interact with your medicine. What should I watch for while using this medicine? Do not take this medicine if you already have nausea and vomiting. Ask your health care provider what to do if you already have nausea. Birth control pills and other methods of hormonal contraception (for example, IUD or patch) may not work properly while you are taking this medicine. Use an extra method of birth control during treatment and for 1 month after your last dose of fosaprepitant. This medicine should not be used continuously for a long time. Visit your doctor or health care professional for regular check-ups. This medicine may change your liver function blood test results. What side effects may I notice from receiving this medicine? Side effects that you should report to your doctor or health care professional as soon as possible: -allergic reactions like skin rash, itching or hives, swelling of the face, lips, or tongue -breathing problems -changes in heart rhythm -high or low blood pressure -pain, redness, or irritation at site where injected -rectal bleeding -serious dizziness or disorientation, confusion -sharp or severe stomach pain -sharp pain in your leg Side effects that usually do not require medical attention (report to your doctor or health care professional if they continue or are  bothersome): -constipation or diarrhea -hair loss -headache -hiccups -loss of appetite -nausea -upset stomach -tiredness This list may not describe all possible side effects. Call your doctor for medical advice about side effects. You may report side effects to FDA at 1-800-FDA-1088. Where should I keep my medicine? This drug is given in a hospital or clinic and will not be stored at home. NOTE: This sheet is a summary. It may not cover all possible information. If you have questions about this medicine, talk to your doctor, pharmacist, or health care provider.    2016, Elsevier/Gold Standard. (2014-06-27 10:45:34) Dexamethasone injection What is this medicine? DEXAMETHASONE (dex a METH a sone) is a corticosteroid. It is used to treat inflammation of the skin, joints, lungs, and other organs. Common conditions treated include asthma, allergies, and arthritis. It is also used for other conditions, like blood disorders and diseases of the adrenal glands. This medicine may be used for other purposes; ask your health care provider  or pharmacist if you have questions. What should I tell my health care provider before I take this medicine? They need to know if you have any of these conditions: -blood clotting problems -Cushing's syndrome -diabetes -glaucoma -heart problems or disease -high blood pressure -infection like herpes, measles, tuberculosis, or chickenpox -kidney disease -liver disease -mental problems -myasthenia gravis -osteoporosis -previous heart attack -seizures -stomach, ulcer or intestine disease including colitis and diverticulitis -thyroid problem -an unusual or allergic reaction to dexamethasone, corticosteroids, other medicines, lactose, foods, dyes, or preservatives -pregnant or trying to get pregnant -breast-feeding How should I use this medicine? This medicine is for injection into a muscle, joint, lesion, soft tissue, or vein. It is given by a health care  professional in a hospital or clinic setting. Talk to your pediatrician regarding the use of this medicine in children. Special care may be needed. Overdosage: If you think you have taken too much of this medicine contact a poison control center or emergency room at once. NOTE: This medicine is only for you. Do not share this medicine with others. What if I miss a dose? This may not apply. If you are having a series of injections over a prolonged period, try not to miss an appointment. Call your doctor or health care professional to reschedule if you are unable to keep an appointment. What may interact with this medicine? Do not take this medicine with any of the following medications: -mifepristone, RU-486 -vaccines This medicine may also interact with the following medications: -amphotericin B -antibiotics like clarithromycin, erythromycin, and troleandomycin -aspirin and aspirin-like drugs -barbiturates like phenobarbital -carbamazepine -cholestyramine -cholinesterase inhibitors like donepezil, galantamine, rivastigmine, and tacrine -cyclosporine -digoxin -diuretics -ephedrine -male hormones, like estrogens or progestins and birth control pills -indinavir -isoniazid -ketoconazole -medicines for diabetes -medicines that improve muscle tone or strength for conditions like myasthenia gravis -NSAIDs, medicines for pain and inflammation, like ibuprofen or naproxen -phenytoin -rifampin -thalidomide -warfarin This list may not describe all possible interactions. Give your health care provider a list of all the medicines, herbs, non-prescription drugs, or dietary supplements you use. Also tell them if you smoke, drink alcohol, or use illegal drugs. Some items may interact with your medicine. What should I watch for while using this medicine? Your condition will be monitored carefully while you are receiving this medicine. If you are taking this medicine for a long time, carry an  identification card with your name and address, the type and dose of your medicine, and your doctor's name and address. This medicine may increase your risk of getting an infection. Stay away from people who are sick. Tell your doctor or health care professional if you are around anyone with measles or chickenpox. Talk to your health care provider before you get any vaccines that you take this medicine. If you are going to have surgery, tell your doctor or health care professional that you have taken this medicine within the last twelve months. Ask your doctor or health care professional about your diet. You may need to lower the amount of salt you eat. The medicine can increase your blood sugar. If you are a diabetic check with your doctor if you need help adjusting the dose of your diabetic medicine. What side effects may I notice from receiving this medicine? Side effects that you should report to your doctor or health care professional as soon as possible: -allergic reactions like skin rash, itching or hives, swelling of the face, lips, or tongue -black or tarry stools -change in  the amount of urine -changes in vision -confusion, excitement, restlessness, a false sense of well-being -fever, sore throat, sneezing, cough, or other signs of infection, wounds that will not heal -hallucinations -increased thirst -mental depression, mood swings, mistaken feelings of self importance or of being mistreated -pain in hips, back, ribs, arms, shoulders, or legs -pain, redness, or irritation at the injection site -redness, blistering, peeling or loosening of the skin, including inside the mouth -rounding out of face -swelling of feet or lower legs -unusual bleeding or bruising -unusual tired or weak -wounds that do not heal Side effects that usually do not require medical attention (report to your doctor or health care professional if they continue or are bothersome): -diarrhea or  constipation -change in taste -headache -nausea, vomiting -skin problems, acne, thin and shiny skin -touble sleeping -unusual growth of hair on the face or body -weight gain This list may not describe all possible side effects. Call your doctor for medical advice about side effects. You may report side effects to FDA at 1-800-FDA-1088. Where should I keep my medicine? This drug is given in a hospital or clinic and will not be stored at home. NOTE: This sheet is a summary. It may not cover all possible information. If you have questions about this medicine, talk to your doctor, pharmacist, or health care provider.    2016, Elsevier/Gold Standard. (2007-09-01 14:04:12) Cisplatin injection What is this medicine? CISPLATIN (SIS pla tin) is a chemotherapy drug. It targets fast dividing cells, like cancer cells, and causes these cells to die. This medicine is used to treat many types of cancer like bladder, ovarian, and testicular cancers. This medicine may be used for other purposes; ask your health care provider or pharmacist if you have questions. What should I tell my health care provider before I take this medicine? They need to know if you have any of these conditions: -blood disorders -hearing problems -kidney disease -recent or ongoing radiation therapy -an unusual or allergic reaction to cisplatin, carboplatin, other chemotherapy, other medicines, foods, dyes, or preservatives -pregnant or trying to get pregnant -breast-feeding How should I use this medicine? This drug is given as an infusion into a vein. It is administered in a hospital or clinic by a specially trained health care professional. Talk to your pediatrician regarding the use of this medicine in children. Special care may be needed. Overdosage: If you think you have taken too much of this medicine contact a poison control center or emergency room at once. NOTE: This medicine is only for you. Do not share this medicine  with others. What if I miss a dose? It is important not to miss a dose. Call your doctor or health care professional if you are unable to keep an appointment. What may interact with this medicine? -dofetilide -foscarnet -medicines for seizures -medicines to increase blood counts like filgrastim, pegfilgrastim, sargramostim -probenecid -pyridoxine used with altretamine -rituximab -some antibiotics like amikacin, gentamicin, neomycin, polymyxin B, streptomycin, tobramycin -sulfinpyrazone -vaccines -zalcitabine Talk to your doctor or health care professional before taking any of these medicines: -acetaminophen -aspirin -ibuprofen -ketoprofen -naproxen This list may not describe all possible interactions. Give your health care provider a list of all the medicines, herbs, non-prescription drugs, or dietary supplements you use. Also tell them if you smoke, drink alcohol, or use illegal drugs. Some items may interact with your medicine. What should I watch for while using this medicine? Your condition will be monitored carefully while you are receiving this medicine. You will need  important blood work done while you are taking this medicine. This drug may make you feel generally unwell. This is not uncommon, as chemotherapy can affect healthy cells as well as cancer cells. Report any side effects. Continue your course of treatment even though you feel ill unless your doctor tells you to stop. In some cases, you may be given additional medicines to help with side effects. Follow all directions for their use. Call your doctor or health care professional for advice if you get a fever, chills or sore throat, or other symptoms of a cold or flu. Do not treat yourself. This drug decreases your body's ability to fight infections. Try to avoid being around people who are sick. This medicine may increase your risk to bruise or bleed. Call your doctor or health care professional if you notice any unusual  bleeding. Be careful brushing and flossing your teeth or using a toothpick because you may get an infection or bleed more easily. If you have any dental work done, tell your dentist you are receiving this medicine. Avoid taking products that contain aspirin, acetaminophen, ibuprofen, naproxen, or ketoprofen unless instructed by your doctor. These medicines may hide a fever. Do not become pregnant while taking this medicine. Women should inform their doctor if they wish to become pregnant or think they might be pregnant. There is a potential for serious side effects to an unborn child. Talk to your health care professional or pharmacist for more information. Do not breast-feed an infant while taking this medicine. Drink fluids as directed while you are taking this medicine. This will help protect your kidneys. Call your doctor or health care professional if you get diarrhea. Do not treat yourself. What side effects may I notice from receiving this medicine? Side effects that you should report to your doctor or health care professional as soon as possible: -allergic reactions like skin rash, itching or hives, swelling of the face, lips, or tongue -signs of infection - fever or chills, cough, sore throat, pain or difficulty passing urine -signs of decreased platelets or bleeding - bruising, pinpoint red spots on the skin, black, tarry stools, nosebleeds -signs of decreased red blood cells - unusually weak or tired, fainting spells, lightheadedness -breathing problems -changes in hearing -gout pain -low blood counts - This drug may decrease the number of white blood cells, red blood cells and platelets. You may be at increased risk for infections and bleeding. -nausea and vomiting -pain, swelling, redness or irritation at the injection site -pain, tingling, numbness in the hands or feet -problems with balance, movement -trouble passing urine or change in the amount of urine Side effects that usually  do not require medical attention (report to your doctor or health care professional if they continue or are bothersome): -changes in vision -loss of appetite -metallic taste in the mouth or changes in taste This list may not describe all possible side effects. Call your doctor for medical advice about side effects. You may report side effects to FDA at 1-800-FDA-1088. Where should I keep my medicine? This drug is given in a hospital or clinic and will not be stored at home. NOTE: This sheet is a summary. It may not cover all possible information. If you have questions about this medicine, talk to your doctor, pharmacist, or health care provider.    2016, [employer] Standard. (2007-08-16 14:40:54) Ondansetron tablets What is this medicine? ONDANSETRON (on DAN se tron) is used to treat nausea and vomiting caused by chemotherapy. It is also  used to prevent or treat nausea and vomiting after surgery. This medicine may be used for other purposes; ask your health care provider or pharmacist if you have questions. What should I tell my health care provider before I take this medicine? They need to know if you have any of these conditions: -heart disease -history of irregular heartbeat -liver disease -low levels of magnesium or potassium in the blood -an unusual or allergic reaction to ondansetron, granisetron, other medicines, foods, dyes, or preservatives -pregnant or trying to get pregnant -breast-feeding How should I use this medicine? Take this medicine by mouth with a glass of water. Follow the directions on your prescription label. Take your doses at regular intervals. Do not take your medicine more often than directed. Talk to your pediatrician regarding the use of this medicine in children. Special care may be needed. Overdosage: If you think you have taken too much of this medicine contact a poison control center or emergency room at once. NOTE: This medicine is only for you. Do not  share this medicine with others. What if I miss a dose? If you miss a dose, take it as soon as you can. If it is almost time for your next dose, take only that dose. Do not take double or extra doses. What may interact with this medicine? Do not take this medicine with any of the following medications: -apomorphine -certain medicines for fungal infections like fluconazole, itraconazole, ketoconazole, posaconazole, voriconazole -cisapride -dofetilide -dronedarone -pimozide -thioridazine -ziprasidone This medicine may also interact with the following medications: -carbamazepine -certain medicines for depression, anxiety, or psychotic disturbances -fentanyl -linezolid -MAOIs like Carbex, Eldepryl, Marplan, Nardil, and Parnate -methylene blue (injected into a vein) -other medicines that prolong the QT interval (cause an abnormal heart rhythm) -phenytoin -rifampicin -tramadol This list may not describe all possible interactions. Give your health care provider a list of all the medicines, herbs, non-prescription drugs, or dietary supplements you use. Also tell them if you smoke, drink alcohol, or use illegal drugs. Some items may interact with your medicine. What should I watch for while using this medicine? Check with your doctor or health care professional right away if you have any sign of an allergic reaction. What side effects may I notice from receiving this medicine? Side effects that you should report to your doctor or health care professional as soon as possible: -allergic reactions like skin rash, itching or hives, swelling of the face, lips or tongue -breathing problems -confusion -dizziness -fast or irregular heartbeat -feeling faint or lightheaded, falls -fever and chills -loss of balance or coordination -seizures -sweating -swelling of the hands or feet -tightness in the chest -tremors -unusually weak or tired Side effects that usually do not require medical  attention (report to your doctor or health care professional if they continue or are bothersome): -constipation or diarrhea -headache This list may not describe all possible side effects. Call your doctor for medical advice about side effects. You may report side effects to FDA at 1-800-FDA-1088. Where should I keep my medicine? Keep out of the reach of children. Store between 2 and 30 degrees C (36 and 86 degrees F). Throw away any unused medicine after the expiration date. NOTE: This sheet is a summary. It may not cover all possible information. If you have questions about this medicine, talk to your doctor, pharmacist, or health care provider.    2016, [employer] Standard. (2013-02-15 16:27:45) Prochlorperazine tablets What is this medicine? PROCHLORPERAZINE (proe klor PER a zeen) helps to control  severe nausea and vomiting. This medicine is also used to treat schizophrenia. It can also help patients who experience anxiety that is not due to psychological illness. This medicine may be used for other purposes; ask your health care provider or pharmacist if you have questions. What should I tell my health care provider before I take this medicine? They need to know if you have any of these conditions: -blood disorders or disease -dementia -liver disease or jaundice -Parkinson's disease -uncontrollable movement disorder -an unusual or allergic reaction to prochlorperazine, other medicines, foods, dyes, or preservatives -pregnant or trying to get pregnant -breast-feeding How should I use this medicine? Take this medicine by mouth with a glass of water. Follow the directions on the prescription label. Take your doses at regular intervals. Do not take your medicine more often than directed. Do not stop taking this medicine suddenly. This can cause nausea, vomiting, and dizziness. Ask your doctor or health care professional for advice. Talk to your pediatrician regarding the use of this  medicine in children. Special care may be needed. While this drug may be prescribed for children as young as 2 years for selected conditions, precautions do apply. Overdosage: If you think you have taken too much of this medicine contact a poison control center or emergency room at once. NOTE: This medicine is only for you. Do not share this medicine with others. What if I miss a dose? If you miss a dose, take it as soon as you can. If it is almost time for your next dose, take only that dose. Do not take double or extra doses. What may interact with this medicine? Do not take this medicine with any of the following medications: -amoxapine -antidepressants like citalopram, escitalopram, fluoxetine, paroxetine, and sertraline -deferoxamine -dofetilide -maprotiline -tricyclic antidepressants like amitriptyline, clomipramine, imipramine, nortiptyline and others This medicine may also interact with the following medications: -lithium -medicines for pain -phenytoin -propranolol -warfarin This list may not describe all possible interactions. Give your health care provider a list of all the medicines, herbs, non-prescription drugs, or dietary supplements you use. Also tell them if you smoke, drink alcohol, or use illegal drugs. Some items may interact with your medicine. What should I watch for while using this medicine? Visit your doctor or health care professional for regular checks on your progress. You may get drowsy or dizzy. Do not drive, use machinery, or do anything that needs mental alertness until you know how this medicine affects you. Do not stand or sit up quickly, especially if you are an older patient. This reduces the risk of dizzy or fainting spells. Alcohol may interfere with the effect of this medicine. Avoid alcoholic drinks. This medicine can reduce the response of your body to heat or cold. Dress warm in cold weather and stay hydrated in hot weather. If possible, avoid extreme  temperatures like saunas, hot tubs, very hot or cold showers, or activities that can cause dehydration such as vigorous exercise. This medicine can make you more sensitive to the sun. Keep out of the sun. If you cannot avoid being in the sun, wear protective clothing and use sunscreen. Do not use sun lamps or tanning beds/booths. Your mouth may get dry. Chewing sugarless gum or sucking hard candy, and drinking plenty of water may help. Contact your doctor if the problem does not go away or is severe. What side effects may I notice from receiving this medicine? Side effects that you should report to your doctor or health care professional  as soon as possible: -blurred vision -breast enlargement in men or women -breast milk in women who are not breast-feeding -chest pain, fast or irregular heartbeat -confusion, restlessness -dark yellow or brown urine -difficulty breathing or swallowing -dizziness or fainting spells -drooling, shaking, movement difficulty (shuffling walk) or rigidity -fever, chills, sore throat -involuntary or uncontrollable movements of the eyes, mouth, head, arms, and legs -seizures -stomach area pain -unusually weak or tired -unusual bleeding or bruising -yellowing of skin or eyes Side effects that usually do not require medical attention (report to your doctor or health care professional if they continue or are bothersome): -difficulty passing urine -difficulty sleeping -headache -sexual dysfunction -skin rash, or itching This list may not describe all possible side effects. Call your doctor for medical advice about side effects. You may report side effects to FDA at 1-800-FDA-1088. Where should I keep my medicine? Keep out of the reach of children. Store at room temperature between 15 and 30 degrees C (59 and 86 degrees F). Protect from light. Throw away any unused medicine after the expiration date. NOTE: This sheet is a summary. It may not cover all possible  information. If you have questions about this medicine, talk to your doctor, pharmacist, or health care provider.    2016, [employer] Standard. (2011-09-29 16:59:39) Lidocaine; Prilocaine cream What is this medicine? LIDOCAINE; PRILOCAINE (LYE doe kane; PRIL oh kane) is a topical anesthetic that causes loss of feeling in the skin and surrounding tissues. It is used to numb the skin before procedures or injections. This medicine may be used for other purposes; ask your health care provider or pharmacist if you have questions. What should I tell my health care provider before I take this medicine? They need to know if you have any of these conditions: -glucose-6-phosphate deficiencies -heart disease -kidney or liver disease -methemoglobinemia -an unusual or allergic reaction to lidocaine, prilocaine, other medicines, foods, dyes, or preservatives -pregnant or trying to get pregnant -breast-feeding How should I use this medicine? This medicine is for external use only on the skin. Do not take by mouth. Follow the directions on the prescription label. Wash hands before and after use. Do not use more or leave in contact with the skin longer than directed. Do not apply to eyes or open wounds. It can cause irritation and blurred or temporary loss of vision. If this medicine comes in contact with your eyes, immediately rinse the eye with water. Do not touch or rub the eye. Contact your health care provider right away. Talk to your pediatrician regarding the use of this medicine in children. While this medicine may be prescribed for children for selected conditions, precautions do apply. Overdosage: If you think you have taken too much of this medicine contact a poison control center or emergency room at once. NOTE: This medicine is only for you. Do not share this medicine with others. What if I miss a dose? This medicine is usually only applied once prior to each procedure. It must be in contact  with the skin for a period of time for it to work. If you applied this medicine later than directed, tell your health care professional before starting the procedure. What may interact with this medicine? -acetaminophen -chloroquine -dapsone -medicines to control heart rhythm -nitrates like nitroglycerin and nitroprusside -other ointments, creams, or sprays that may contain anesthetic medicine -phenobarbital -phenytoin -quinine -sulfonamides like sulfacetamide, sulfamethoxazole, sulfasalazine and others This list may not describe all possible interactions. Give your health care provider a list  of all the medicines, herbs, non-prescription drugs, or dietary supplements you use. Also tell them if you smoke, drink alcohol, or use illegal drugs. Some items may interact with your medicine. What should I watch for while using this medicine? Be careful to avoid injury to the treated area while it is numb and you are not aware of pain. Avoid scratching, rubbing, or exposing the treated area to hot or cold temperatures until complete sensation has returned. The numb feeling will wear off a few hours after applying the cream. What side effects may I notice from receiving this medicine? Side effects that you should report to your doctor or health care professional as soon as possible: -blurred vision -chest pain -difficulty breathing -dizziness -drowsiness -fast or irregular heartbeat -skin rash or itching -swelling of your throat, lips, or face -trembling Side effects that usually do not require medical attention (report to your doctor or health care professional if they continue or are bothersome): -changes in ability to feel hot or cold -redness and swelling at the application site This list may not describe all possible side effects. Call your doctor for medical advice about side effects. You may report side effects to FDA at 1-800-FDA-1088. Where should I keep my medicine? Keep out of reach  of children. Store at room temperature between 15 and 30 degrees C (59 and 86 degrees F). Keep container tightly closed. Throw away any unused medicine after the expiration date. NOTE: This sheet is a summary. It may not cover all possible information. If you have questions about this medicine, talk to your doctor, pharmacist, or health care provider.    2016, [employer] Standard. (2007-11-14 17:14:35) Pegfilgrastim injection What is this medicine? PEGFILGRASTIM (PEG fil gra stim) is a long-acting granulocyte colony-stimulating factor that stimulates the growth of neutrophils, a type of white blood cell important in the body's fight against infection. It is used to reduce the incidence of fever and infection in patients with certain types of cancer who are receiving chemotherapy that affects the bone marrow, and to increase survival after being exposed to high doses of radiation. This medicine may be used for other purposes; ask your health care provider or pharmacist if you have questions. What should I tell my health care provider before I take this medicine? They need to know if you have any of these conditions: -kidney disease -latex allergy -ongoing radiation therapy -sickle cell disease -skin reactions to acrylic adhesives (On-Body Injector only) -an unusual or allergic reaction to pegfilgrastim, filgrastim, other medicines, foods, dyes, or preservatives -pregnant or trying to get pregnant -breast-feeding How should I use this medicine? This medicine is for injection under the skin. If you get this medicine at home, you will be taught how to prepare and give the pre-filled syringe or how to use the On-body Injector. Refer to the patient Instructions for Use for detailed instructions. Use exactly as directed. Take your medicine at regular intervals. Do not take your medicine more often than directed. It is important that you put your used needles and syringes in a special sharps  container. Do not put them in a trash can. If you do not have a sharps container, call your pharmacist or healthcare provider to get one. Talk to your pediatrician regarding the use of this medicine in children. While this drug may be prescribed for selected conditions, precautions do apply. Overdosage: If you think you have taken too much of this medicine contact a poison control center or emergency room at once. NOTE:  This medicine is only for you. Do not share this medicine with others. What if I miss a dose? It is important not to miss your dose. Call your doctor or health care professional if you miss your dose. If you miss a dose due to an On-body Injector failure or leakage, a new dose should be administered as soon as possible using a single prefilled syringe for manual use. What may interact with this medicine? Interactions have not been studied. Give your health care provider a list of all the medicines, herbs, non-prescription drugs, or dietary supplements you use. Also tell them if you smoke, drink alcohol, or use illegal drugs. Some items may interact with your medicine. This list may not describe all possible interactions. Give your health care provider a list of all the medicines, herbs, non-prescription drugs, or dietary supplements you use. Also tell them if you smoke, drink alcohol, or use illegal drugs. Some items may interact with your medicine. What should I watch for while using this medicine? You may need blood work done while you are taking this medicine. If you are going to need a MRI, CT scan, or other procedure, tell your doctor that you are using this medicine (On-Body Injector only). What side effects may I notice from receiving this medicine? Side effects that you should report to your doctor or health care professional as soon as possible: -allergic reactions like skin rash, itching or hives, swelling of the face, lips, or tongue -dizziness -fever -pain, redness, or  irritation at site where injected -pinpoint red spots on the skin -red or dark-brown urine -shortness of breath or breathing problems -stomach or side pain, or pain at the shoulder -swelling -tiredness -trouble passing urine or change in the amount of urine Side effects that usually do not require medical attention (report to your doctor or health care professional if they continue or are bothersome): -bone pain -muscle pain This list may not describe all possible side effects. Call your doctor for medical advice about side effects. You may report side effects to FDA at 1-800-FDA-1088. Where should I keep my medicine? Keep out of the reach of children. Store pre-filled syringes in a refrigerator between 2 and 8 degrees C (36 and 46 degrees F). Do not freeze. Keep in carton to protect from light. Throw away this medicine if it is left out of the refrigerator for more than 48 hours. Throw away any unused medicine after the expiration date. NOTE: This sheet is a summary. It may not cover all possible information. If you have questions about this medicine, talk to your doctor, pharmacist, or health care provider.    2016, [employer] Standard. (2014-05-31 14:30:14)

## 2015-08-16 ENCOUNTER — Other Ambulatory Visit (HOSPITAL_COMMUNITY): Payer: Self-pay | Admitting: Oncology

## 2015-08-16 ENCOUNTER — Encounter (HOSPITAL_COMMUNITY): Payer: Self-pay | Admitting: Hematology & Oncology

## 2015-08-16 ENCOUNTER — Encounter: Payer: Self-pay | Admitting: Dietician

## 2015-08-16 ENCOUNTER — Encounter (HOSPITAL_BASED_OUTPATIENT_CLINIC_OR_DEPARTMENT_OTHER): Payer: BLUE CROSS/BLUE SHIELD | Admitting: Hematology & Oncology

## 2015-08-16 ENCOUNTER — Encounter (HOSPITAL_COMMUNITY): Payer: Self-pay | Admitting: *Deleted

## 2015-08-16 ENCOUNTER — Encounter (HOSPITAL_COMMUNITY): Payer: BLUE CROSS/BLUE SHIELD

## 2015-08-16 DIAGNOSIS — C099 Malignant neoplasm of tonsil, unspecified: Secondary | ICD-10-CM

## 2015-08-16 DIAGNOSIS — C090 Malignant neoplasm of tonsillar fossa: Secondary | ICD-10-CM

## 2015-08-16 DIAGNOSIS — K766 Portal hypertension: Secondary | ICD-10-CM | POA: Diagnosis not present

## 2015-08-16 DIAGNOSIS — R1310 Dysphagia, unspecified: Secondary | ICD-10-CM

## 2015-08-16 DIAGNOSIS — K7460 Unspecified cirrhosis of liver: Secondary | ICD-10-CM | POA: Diagnosis not present

## 2015-08-16 MED ORDER — ONDANSETRON 8 MG PO TBDP: 8.0000 mg | ORAL_TABLET | Freq: Three times a day (TID) | ORAL | Status: DC | PRN

## 2015-08-16 MED ORDER — SODIUM FLUORIDE 1.1 % DT GEL: DENTAL | Status: DC

## 2015-08-16 NOTE — Progress Notes (Signed)
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 To undergo chemoradiotherapy. Pending PEG.   Contacted Pt by visiting during appointment for chemo teaching.   Wt Readings from Last 10 Encounters:  08/02/15 183 lb 3.2 oz (83.099 kg)  07/31/15 180 lb 6.4 oz (81.829 kg)  07/24/15 176 lb (79.833 kg)  07/09/15 179 lb (81.194 kg)   Patient denies any significant weight loss, indicating he may have lost a slight about due to tonsillectomy.   Per Documentation, weight appears to be increasing.   He denies any dysphagic episodes since his tonsillectomy. Denies any loss of appetite. His eating pattern is not ideal. He  Typically eats only a very late breakfast and a very large dinner. He eats a "moderate" amount of vegetables. He has been juicing.   RD went over the importance of weight maintenance throughout treatment. As he denies any trouble with PO intake at this time, encouraged him to continue with this route as long as he is able. RD Stressed high protein/calorie foods and provided handout. He was agreeable to changing his eating schedule to 4-5 smaller meals if needed. He has not tried Ensure/Boost as he is still at his baseline PO intake. Gave coupons if he wanted to start looking for one he likes.   RD spent time going over tube feeding. Discussed different methods, formulas and feeding times. Pt has been juicing and has noticed some acidic foods burn his throat. He asked about putting pureed food or his shakes through the tube. Discouraged this as residue left in tube can cause clogs and any acidic products can facilitate tube breakdown. Reccommended only putting TF formula, Water, and medications, if needed, through his G tube.   Emphasized that as long as patient is able to eat orally, he is encouraged to do so. Exercising his swallow function is beneficial. However, did recommend beginning to use tube early to  become familiar and comfortable with using. Advised simply starting with flushes and maybe a single can a day as long as he is eating fine. He can slowly increase amount of cans to meet 100% of pro/energy requirements if needed.    RD will follow up as he begins chemo. Will hold off on TF recommendations until tube is placed.   Pt seems to be well motivated, educated and supported with family. Expect very good compliance.  Left my contact info, coupons,and handouts titled "Increasing calories and protein"  [name] RD, LDN Nutrition Pager: [phone] 08/16/2015 11:18 AM

## 2015-08-16 NOTE — Progress Notes (Signed)
Chemo teaching done and consent signed for Cisplatin. Calendar given to patient. Distress screening done.

## 2015-08-16 NOTE — Patient Instructions (Addendum)
[place] at [facility] Discharge Instructions  RECOMMENDATIONS MADE BY THE CONSULTANT AND ANY TEST RESULTS WILL BE SENT TO YOUR REFERRING PHYSICIAN.     Exam and discussion by Dr Whitney Muse today You are going to start on cisplatin every 3 weeks. You will need fluids in between treatments, this will make you feel better. Gatorade is good, add protein powder to it if you want Speech therapy consult placed, they will cal you with this appt Message sent to [facility] about your nutrition   Return to see the doctor on the first day you start treatment (3/29) Please call the clinic if you have any questions or concerns      Thank you for choosing [place] at [facility] to provide your oncology and hematology care.  To afford each patient quality time with our provider, please arrive at least 15 minutes before your scheduled appointment time.   Beginning January 23rd 2017 lab work for the [facility] will be done in the  Main lab at [facility] on 1st floor. If you have a lab appointment with the [facility] please come in thru the  Main Entrance and check in at the main information desk  You need to re-schedule your appointment should you arrive 10 or more minutes late.  We strive to give you quality time with our providers, and arriving late affects you and other patients whose appointments are after yours.  Also, if you no show three or more times for appointments you may be dismissed from the clinic at the providers discretion.     Again, thank you for choosing [facility].  Our hope is that these requests will decrease the amount of time that you wait before being seen by our physicians.       _____________________________________________________________  Should you have questions after your visit to [facility], please contact our office at [phone] between the hours of 8:30 a.m. and 4:30 p.m.  Voicemails  left after 4:30 p.m. will not be returned until the following business day.  For prescription refill requests, have your pharmacy contact our office.         Resources For Cancer Patients and their Caregivers ? American Cancer Society: Can assist with transportation, wigs, general needs, runs Look Good Feel Better.        ([phone] [phone] ? Cancer Care: Provides financial assistance, online support groups, medication/co-pay assistance.  1-800-813-HOPE (430) 351-4312) ? [place] Assists [place] cancer patients and their families through emotional , educational and financial support.  906-179-2719 ? [place] DSS Where to apply for food stamps, Medicaid and utility assistance. 681-373-7291 ? [facility]: Transportation to medical appointments. 954-725-8646 ? Social Security Administration: May apply for disability if have a Stage IV cancer. [phone] 917-252-4573 ? [employer], Disability and Transit Services: Assists with nutrition, care and transit needs. [phone]

## 2015-08-16 NOTE — Progress Notes (Signed)
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 He is seen at [facility], Hepatitis C negative CT neck with contrast 06/25/2015 at [place] level 2 malignant LAD, largest node is 3.5 cm long axis. R worse than L carotid atherosclerosis and stenosis. CT chest with no acute or significant findings within the chest   HISTORY OF PRESENTING ILLNESS:  Frank Castillo 65 y.o. male is here because of referral by Dr. Benjamine Castillo for evaluation of Left tonsil invasive squamous cell carcinoma. Left tonsillectomy performed on 07/09/2015 with Dr. Benjamine Castillo.  Frank Castillo returns to the [facility] today accompanied by his sister.  He's laughing at the start of his appointment, saying "I'm tired of women telling me what to do all the time!" noting that all of his doctors are now women. His sister says he likes the attention. He says that yesterday he had a little scheduling conflict, but after calling Frank Castillo and having a little pause, he was able to get everything collected. He says it's a challenge already, setting everything up and getting started.  He has decided to have his radiation in [place].  He was advised at length that he absolutely needs to let us know throughout treatment when he needs assistance or isn't feeling well. He says the anxiety leading up to it is worse than anything.  Today, he asks if he can make "nutritious fluids" to put into his feeding tube. He was advised that the issue for him will be caloric intake, getting (x) calories per day, and that he should talk to Frank Castillo about what he will be able to use through his FT.   He says he gets nervous about the fact that he's  going to need pain medication because of his work. He notes that his job is recommending that he not work if he is under the influence, because he has to be attentive, ready to assist anyone who comes to him for help.   His sister asks questions about his treatment path and what they can expect moving forward.    MEDICAL HISTORY:  Past Medical History  Diagnosis Date  . Tonsillar mass     left  . Hypertension   . Cirrhosis ([place])   . Portal hypertension ([place])     SURGICAL HISTORY: Past Surgical History  Procedure Laterality Date  . Hernia repair Right   . Mouth surgery    . Tonsillectomy Left 07/09/2015    Procedure: LEFT TONSILLECTOMY;  Surgeon: Leta Baptist, MD;  Location: [place];  Service: ENT;  Laterality: Left;    SOCIAL HISTORY: Social History   Social History  . Marital Status: Single    Spouse Name: N/A  . Number of Children: 0  . Years of Education: N/A   Occupational History  . Not on file.   Social History Main Topics  . Smoking status: Current Every Day Smoker -- 1.00 packs/day    Types: Cigarettes  . Smokeless tobacco: Never Used  . Alcohol Use: No     Comment: former abuse- quit 09/2011  . Drug Use: No  . Sexual Activity: Not on file   Other Topics Concern  . Not on file   Social History Narrative   Divorced.  No children.   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   Currently smoking approximately one pack per day.   Has been smoking off/on since age 89.   Divorced No children. Just step-children. 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 Previously worked in [employer]. Originally from [place], [place]. Current smoker at 1 ppd. Began smoking at 4-15 yo, regularly smoking at 68-21 yo. During his "cocaine years" he smoked about 2 ppd. ETOH, recovering alcoholic. 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 He enjoys reading. 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  FAMILY HISTORY: Family History  Problem Relation  Age of Onset  . Macular degeneration Mother   . Alcoholism Father    indicated that his mother is alive. He indicated that his father is deceased.   Mother still living at 12 years-old, with macular degeneration. She has no other health issues. Father deceased. Parents divorced at 30 years-old, so he is not sure what he died from. Father was an alcoholic. 4 sisters. 1 sister died of suicide. 1 sister was adopted. 1 Sister lives in [place]. 1 Sister lives in [place].  ALLERGIES:  is allergic to bee venom.  MEDICATIONS:  Current Outpatient Prescriptions  Medication Sig Dispense Refill  . cetirizine (ZYRTEC) 10 MG tablet Take 10 mg by mouth daily.    . cholecalciferol (VITAMIN D) 1000 units tablet Take 1,000 Units by mouth daily.    Marland Kitchen CISPLATIN IV Inject into the vein every 21 ( twenty-one) days.    Marland Kitchen lidocaine-prilocaine (EMLA) cream Apply a quarter size amount to port site 1 hour prior to chemo. Do not rub in. Cover with plastic wrap. 30 g 3  . lisinopril-hydrochlorothiazide (PRINZIDE,ZESTORETIC) 20-25 MG tablet Take 1 tablet by mouth daily.  11  . Melatonin 10 MG TABS Take by mouth.    . nicotine (NICODERM CQ) 14 mg/24hr patch apply 21 mg patch daily x6wk, then 14 mg patch daily x2wk, then 7 mg patch daily x2wk 14 patch 0  . nicotine (NICODERM CQ) 21 mg/24hr patch apply 21 mg patch daily x6wk, then 14 mg patch daily x2wk, then 7 mg patch daily x2wk 21 patch 1  . nicotine (NICODERM CQ) 7 mg/24hr patch apply 21 mg patch daily x6wk, then 14 mg patch daily x2wk, then 7 mg patch daily x2wk 14 patch 0  . oxyCODONE (ROXICODONE) 5 MG/5ML solution Take 5-10 mLs (5-10 mg total) by mouth every 4 (four) hours as needed for severe pain. 250 mL 0  . Pegfilgrastim (NEULASTA ONPRO [place]) Inject into the skin. Every 21 days    . prochlorperazine (COMPAZINE) 10 MG tablet Take 1 tablet (10 mg total) by mouth every 6 (six) hours as needed for nausea or vomiting. 30 tablet 2  . ondansetron (ZOFRAN ODT) 8 MG  disintegrating tablet Take 1 tablet (8 mg total) by mouth every 8 (eight) hours as needed for nausea or vomiting. 30 tablet 2  . sodium fluoride (FLUORISHIELD) 1.1 % GEL dental gel Instill one drop of gel per tooth space of fluoride tray. Place over teeth for 5 minutes. Remove. Spit out excess. Repeat nightly. 120 mL prn   No current facility-administered medications for this visit.    Review of Systems  Constitutional: Negative for fever, chills and malaise/fatigue.  HENT: Negative.  Negative for congestion, hearing loss, nosebleeds, sore throat and tinnitus.        L neck mass  Eyes: Negative.  Negative for blurred vision, double vision, pain and discharge.  Respiratory: Negative.  Negative for cough, hemoptysis, sputum production, shortness of breath and wheezing.   Cardiovascular: Negative.  Negative for chest pain, palpitations, claudication, leg swelling and PND.  Gastrointestinal: Negative.  Negative for heartburn, nausea, vomiting, abdominal pain, diarrhea, constipation, blood in stool and melena.  Genitourinary: Negative.  Negative for dysuria, urgency, frequency and hematuria.  Musculoskeletal: Negative.  Negative for myalgias, joint pain and falls.  Skin: Negative.  Negative for itching and rash.  Neurological: Negative.  Negative for dizziness, tingling, tremors, sensory change, speech change, focal weakness, seizures, loss of consciousness, weakness and headaches.  Endo/Heme/Allergies: Negative.  Does not bruise/bleed easily.  Psychiatric/Behavioral: Negative.  Negative for depression, suicidal ideas, memory loss and substance abuse. The patient is not nervous/anxious and does not have insomnia.   All other systems reviewed and are negative.  14 point ROS was done and is otherwise as detailed above or in HPI   PHYSICAL EXAMINATION: ECOG PERFORMANCE STATUS: 0 - Asymptomatic Vitals - 1 value per visit 08/05/2015 2/63/7858  SYSTOLIC 850 277  DIASTOLIC 73 72  Pulse 85 84    Temperature 98.1 98.2  Respirations    Weight (lb)  183.2  Height  '5\' 10"'   BMI  26.29    Physical Exam  Constitutional: He is oriented to person, place, and time and well-developed, well-nourished, and in no distress.  HENT:  Head: Normocephalic and atraumatic.  Nose: Nose normal.  Mouth/Throat: Oropharynx is clear and moist. No oropharyngeal exudate.  Eyes: Conjunctivae and EOM are normal. Pupils are equal, round, and reactive to light. Right eye exhibits no discharge. Left eye exhibits no discharge. No scleral icterus.  Neck: Normal range of motion. Neck supple. No tracheal deviation present. No thyromegaly present.  Two palpable lymph nodes, level 2.  Cardiovascular: Normal rate, regular rhythm and normal heart sounds.  Exam reveals no gallop and no friction rub.   No murmur heard. Pulmonary/Chest: Effort normal and breath sounds normal. He has no wheezes. He has no rales.  Abdominal: Soft. Bowel sounds are normal. He exhibits no distension and no mass. There is no tenderness. There is no rebound and no guarding.  Musculoskeletal: Normal range of motion. He exhibits no edema.  Lymphadenopathy:       Head (left side): Submental adenopathy present.    He has no cervical adenopathy.  Neurological: He is alert and oriented to person, place, and time. He has normal reflexes. No cranial nerve deficit. Gait normal. Coordination normal.  Skin: Skin is warm and dry. No rash noted.  Psychiatric: Mood, memory, affect and judgment normal.  Nursing note and vitals reviewed.   LABORATORY DATA:  I have reviewed the data as listed  Results for KERN, GINGRAS (MRN [id number]) as of 08/16/2015 16:22  Ref. Range 07/04/2015 15:00  Sodium Latest Ref Range: 135-145 mmol/L 136  Potassium Latest Ref Range: 3.5-5.1 mmol/L 4.6  Chloride Latest Ref Range: 101-111 mmol/L 100 (L)  CO2 Latest Ref Range: 22-32 mmol/L 28  BUN Latest Ref Range: 6-20 mg/dL 17  Creatinine Latest Ref Range: 0.61-1.24 mg/dL  0.98  Calcium Latest Ref Range: 8.9-10.3 mg/dL 9.6  EGFR (Non-African Amer.) Latest Ref Range: >60 mL/min >60  EGFR (African American) Latest Ref Range: >60 mL/min >60  Glucose Latest Ref Range: 65-99 mg/dL 90  Anion gap Latest Ref Range: 5-15  8      RADIOGRAPHIC STUDIES: I have personally reviewed the radiological reports as listed.  Study Result     CLINICAL DATA: Initial treatment strategy for primary tonsillar squamous cell  carcinoma. Left-sided lung tonsillectomy.  EXAM: NUCLEAR MEDICINE PET SKULL BASE TO THIGH  TECHNIQUE: 8.8 mCi F-18 FDG was injected intravenously. Full-ring PET imaging was performed from the skull base to thigh after the radiotracer. CT data was obtained and used for attenuation correction and anatomic localization.  FASTING BLOOD GLUCOSE: Value: 116 mg/dl  COMPARISON: Clinic note of 07/24/2015. Neck and chest CTs of 06/25/2015.  FINDINGS: NECK  No tonsilar hypermetabolism.  Hypermetabolic left-sided level 2 nodes. Index node measures 2.1 cm and a S.U.V. max of 17.9 on image 25/series 4. 1.9 cm on 06/25/2015.  Immediately inferior node measures 9 mm and a S.U.V. max of 3.8 on image 30/series 4.  CHEST  No areas of abnormal hypermetabolism.  ABDOMEN/PELVIS  No areas of abnormal hypermetabolism.  SKELETON  No abnormal marrow activity.  CT IMAGES PERFORMED FOR ATTENUATION CORRECTION  carotid atherosclerosis. Mild cardiomegaly with multivessel coronary artery atherosclerosis. Mild centrilobular emphysema. Mild bibasilar atelectasis. Normal adrenal glands. Gallstones and a contracted gallbladder. Moderate cirrhosis. Advanced abdominal aortic atherosclerosis. Scattered colonic diverticula. Mild prostatomegaly. Fat containing left inguinal hernia. Prior right inguinal hernia repair.  IMPRESSION: 1. Hypermetabolic left-sided level 2 adenopathy, consistent with nodal metastasis. 2. No extracervical metastatic  disease identified. 3. Cirrhosis. 4. Atherosclerosis, including within the coronary arteries. 5. Cholelithiasis.   Electronically Signed  By: Abigail Miyamoto M.D.  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 He is seen at [facility], Hepatitis C negative CT neck with contrast 06/25/2015 at [place] level 2 malignant LAD, largest node is 3.5 cm long axis. R worse than L carotid atherosclerosis and stenosis. CT chest with no acute or significant findings within the chest Stage IVA T2N2bM0   He was advised that his treatment course is not easy; that it's important to recognize that we are here to prevent complications and make him successful in treatment. We discussed the importance of hydration, nutrition, and management of treatment related side effects during the course of his treatment. He knows he will need to let us know if he has any problems during treatment. He will be seen on a weekly basis.   He has not yet met with Frank Castillo for nutritional guidance. He has also not met with speech therapy yet. He will meet with Frank Castillo with home health to discuss his feeding and nutrition. He has been referred to speech therapy.   Frank Castillo will come in to discuss when his chemotherapy starts and for chemotherapy teaching.    Orders Placed This Encounter  Procedures  . Ambulatory referral to Speech Therapy    Referral Priority:  Routine    Referral Type:  Speech Therapy    Referral Reason:  Specialty Services Required    Requested Specialty:  Speech Pathology    Number of Visits Requested:  1    All questions were answered. The patient knows to call the clinic with any problems, questions or concerns.  This document serves as a record of services personally performed by Ancil Linsey, MD. It was created on her behalf by Toni Amend, a  trained medical scribe. The creation of this record is based on the scribe's personal observations and the provider's statements to them. This document has been checked and approved by the attending provider.  I have reviewed the above documentation for accuracy and completeness, and I agree with the above.  This note was electronically signed.  Molli Hazard, MD  08/20/2015 10:51 AM

## 2015-08-19 ENCOUNTER — Other Ambulatory Visit: Payer: Self-pay | Admitting: Radiology

## 2015-08-20 ENCOUNTER — Other Ambulatory Visit (HOSPITAL_COMMUNITY): Payer: Self-pay | Admitting: Hematology & Oncology

## 2015-08-20 ENCOUNTER — Ambulatory Visit (HOSPITAL_COMMUNITY)
Admission: RE | Admit: 2015-08-20 | Discharge: 2015-08-20 | Disposition: A | Discharge status: Home / Self Care | Payer: BLUE CROSS/BLUE SHIELD | Source: Ambulatory Visit | Attending: Hematology & Oncology | Admitting: Hematology & Oncology

## 2015-08-20 ENCOUNTER — Encounter (HOSPITAL_COMMUNITY): Payer: Self-pay

## 2015-08-20 DIAGNOSIS — C099 Malignant neoplasm of tonsil, unspecified: Secondary | ICD-10-CM

## 2015-08-20 DIAGNOSIS — I10 Essential (primary) hypertension: Secondary | ICD-10-CM | POA: Diagnosis not present

## 2015-08-20 DIAGNOSIS — K7460 Unspecified cirrhosis of liver: Secondary | ICD-10-CM | POA: Insufficient documentation

## 2015-08-20 DIAGNOSIS — F17210 Nicotine dependence, cigarettes, uncomplicated: Secondary | ICD-10-CM | POA: Insufficient documentation

## 2015-08-20 LAB — CBC WITH DIFFERENTIAL/PLATELET
BASOS PCT: 1 %
Basophils Absolute: 0.1 10*3/uL (ref 0.0–0.1)
EOS ABS: 0.2 10*3/uL (ref 0.0–0.7)
Eosinophils Relative: 2 %
HCT: 40 % (ref 39.0–52.0)
Hemoglobin: 13.1 g/dL (ref 13.0–17.0)
Lymphocytes Relative: 33 %
Lymphs Abs: 2.8 10*3/uL (ref 0.7–4.0)
MCH: 27.9 pg (ref 26.0–34.0)
MCHC: 32.8 g/dL (ref 30.0–36.0)
MCV: 85.1 fL (ref 78.0–100.0)
MONO ABS: 0.6 10*3/uL (ref 0.1–1.0)
MONOS PCT: 7 %
NEUTROS PCT: 57 %
Neutro Abs: 4.8 10*3/uL (ref 1.7–7.7)
Platelets: 253 10*3/uL (ref 150–400)
RBC: 4.7 MIL/uL (ref 4.22–5.81)
RDW: 15.5 % (ref 11.5–15.5)
WBC: 8.4 10*3/uL (ref 4.0–10.5)

## 2015-08-20 LAB — BASIC METABOLIC PANEL
Anion gap: 8 (ref 5–15)
BUN: 20 mg/dL (ref 6–20)
CALCIUM: 9.7 mg/dL (ref 8.9–10.3)
CO2: 24 mmol/L (ref 22–32)
CREATININE: 1 mg/dL (ref 0.61–1.24)
Chloride: 104 mmol/L (ref 101–111)
GLUCOSE: 104 mg/dL — AB (ref 65–99)
Potassium: 4 mmol/L (ref 3.5–5.1)
Sodium: 136 mmol/L (ref 135–145)

## 2015-08-20 LAB — PROTIME-INR
INR: 1.05 (ref 0.00–1.49)
PROTHROMBIN TIME: 13.9 s (ref 11.6–15.2)

## 2015-08-20 MED ORDER — SODIUM CHLORIDE 0.9 % IV SOLN
INTRAVENOUS | Status: DC
  Administered 2015-08-20: 12:00:00 via INTRAVENOUS

## 2015-08-20 MED ORDER — HYDROMORPHONE HCL 1 MG/ML IJ SOLN: 1.0000 mg | INTRAMUSCULAR | Status: DC | PRN

## 2015-08-20 MED ORDER — FENTANYL CITRATE (PF) 100 MCG/2ML IJ SOLN
INTRAMUSCULAR | Status: AC
Start: 2015-08-20 — End: 2015-08-20
  Filled 2015-08-20: qty 4

## 2015-08-20 MED ORDER — HEPARIN SOD (PORK) LOCK FLUSH 100 UNIT/ML IV SOLN
INTRAVENOUS | Status: DC | PRN
  Administered 2015-08-20: 500 [IU] via INTRAVENOUS

## 2015-08-20 MED ORDER — OXYCODONE-ACETAMINOPHEN 5-325 MG PO TABS: 1.0000 | ORAL_TABLET | ORAL | Status: DC | PRN

## 2015-08-20 MED ORDER — CEFAZOLIN SODIUM-DEXTROSE 2-4 GM/100ML-% IV SOLN
2.0000 g | INTRAVENOUS | Status: AC
  Administered 2015-08-20: 2 g via INTRAVENOUS
  Filled 2015-08-20: qty 100

## 2015-08-20 MED ORDER — LIDOCAINE HCL 1 % IJ SOLN
INTRAMUSCULAR | Status: AC
  Filled 2015-08-20: qty 20

## 2015-08-20 MED ORDER — HYDROCODONE-ACETAMINOPHEN 5-325 MG PO TABS: 1.0000 | ORAL_TABLET | ORAL | Status: DC | PRN

## 2015-08-20 MED ORDER — ONDANSETRON HCL 4 MG/2ML IJ SOLN: 4.0000 mg | INTRAMUSCULAR | Status: DC | PRN

## 2015-08-20 MED ORDER — MIDAZOLAM HCL 2 MG/2ML IJ SOLN
INTRAMUSCULAR | Status: DC | PRN
  Administered 2015-08-20 (×6): 1 mg via INTRAVENOUS

## 2015-08-20 MED ORDER — FENTANYL CITRATE (PF) 100 MCG/2ML IJ SOLN
INTRAMUSCULAR | Status: DC | PRN
  Administered 2015-08-20: 50 ug via INTRAVENOUS
  Administered 2015-08-20 (×3): 25 ug via INTRAVENOUS

## 2015-08-20 MED ORDER — LIDOCAINE-EPINEPHRINE 1 %-1:100000 IJ SOLN
INTRAMUSCULAR | Status: DC | PRN
  Administered 2015-08-20: 10 mL via INTRADERMAL

## 2015-08-20 MED ORDER — LIDOCAINE HCL 1 % IJ SOLN
INTRAMUSCULAR | Status: DC | PRN
  Administered 2015-08-20: 10 mL via INTRADERMAL

## 2015-08-20 MED ORDER — GLUCAGON HCL RDNA (DIAGNOSTIC) 1 MG IJ SOLR
INTRAMUSCULAR | Status: AC
  Filled 2015-08-20: qty 1

## 2015-08-20 MED ORDER — MIDAZOLAM HCL 2 MG/2ML IJ SOLN
INTRAMUSCULAR | Status: AC
Start: 2015-08-20 — End: 2015-08-20
  Filled 2015-08-20: qty 6

## 2015-08-20 MED ORDER — IOHEXOL 300 MG/ML  SOLN
50.0000 mL | Freq: Once | INTRAMUSCULAR | Status: AC | PRN
  Administered 2015-08-20: 5 mL

## 2015-08-20 MED ORDER — LIDOCAINE-EPINEPHRINE 2 %-1:100000 IJ SOLN
INTRAMUSCULAR | Status: AC
  Filled 2015-08-20: qty 1

## 2015-08-20 MED ORDER — HEPARIN SOD (PORK) LOCK FLUSH 100 UNIT/ML IV SOLN
INTRAVENOUS | Status: AC
  Filled 2015-08-20: qty 5

## 2015-08-20 MED FILL — OXYCODONE/APAP 5/325MG: 5-325 | 3 days supply | Qty: 30 | Fill #0

## 2015-08-20 NOTE — Sedation Documentation (Signed)
Patient denies pain and is resting comfortably.  

## 2015-08-20 NOTE — Discharge Instructions (Signed)
Moderate Conscious Sedation, Adult Sedation is the use of medicines to promote relaxation and relieve discomfort and anxiety. Moderate conscious sedation is a type of sedation. Under moderate conscious sedation you are less alert than normal but are still able to respond to instructions or stimulation. Moderate conscious sedation is used during short medical and dental procedures. It is milder than deep sedation or general anesthesia and allows you to return to your regular activities sooner. LET [facility] CARE PROVIDER KNOW ABOUT:   Any allergies you have.  All medicines you are taking, including vitamins, herbs, eye drops, creams, and over-the-counter medicines.  Use of steroids (by mouth or creams).  Previous problems you or members of your family have had with the use of anesthetics.  Any blood disorders you have.  Previous surgeries you have had.  Medical conditions you have.  Possibility of pregnancy, if this applies.  Use of cigarettes, alcohol, or illegal drugs. RISKS AND COMPLICATIONS Generally, this is a safe procedure. However, as with any procedure, problems can occur. Possible problems include:  Oversedation.  Trouble breathing on your own. You may need to have a breathing tube until you are awake and breathing on your own.  Allergic reaction to any of the medicines used for the procedure. BEFORE THE PROCEDURE  You may have blood tests done. These tests can help show how well your kidneys and liver are working. They can also show how well your blood clots.  A physical exam will be done.  Only take medicines as directed by your health care provider. You may need to stop taking medicines (such as blood thinners, aspirin, or nonsteroidal anti-inflammatory drugs) before the procedure.   Do not eat or drink at least 6 hours before the procedure or as directed by your health care provider.  Arrange for a responsible adult, family member, or friend to take you home  after the procedure. He or she should stay with you for at least 24 hours after the procedure, until the medicine has worn off. PROCEDURE   An intravenous (IV) catheter will be inserted into one of your veins. Medicine will be able to flow directly into your body through this catheter. You may be given medicine through this tube to help prevent pain and help you relax.  The medical or dental procedure will be done. AFTER THE PROCEDURE  You will stay in a recovery area until the medicine has worn off. Your blood pressure and pulse will be checked.   Depending on the procedure you had, you may be allowed to go home when you can tolerate liquids and your pain is under control.   This information is not intended to replace advice given to you by your health care provider. Make sure you discuss any questions you have with your health care provider.   Document Released: 02/03/2001 Document Revised: 06/01/2014 Document Reviewed: 01/16/2013 Elsevier Interactive Patient Education 2016 [place].  Follow instructions per [facility] Gastrostomy Tube Replacement, Care After Refer to this sheet in the next few weeks. These instructions provide you with information on caring for yourself after your procedure. Your health care provider may also give you more specific instructions. Your treatment has been planned according to current medical practices, but problems sometimes occur. Call your health care provider if you have any problems or questions after your procedure. WHAT TO EXPECT AFTER THE PROCEDURE  After your procedure, it is typical to have the following:  Mild abdominal pain. A small amount of blood-tinged  fluid leaking from the replacement site. HOME CARE INSTRUCTIONS You may resume your normal level of activity. You may resume your normal feedings. Care for your gastrostomy tube as you did before, or as directed by your health care provider. SEEK MEDICAL CARE IF: You have a  fever or chills. You have redness or irritation near the insertion site. You continue to have abdominal pain or leaking around your gastrostomy tube. SEEK IMMEDIATE MEDICAL CARE IF:  You develop bleeding or significant discharge around the tube. You have severe abdominal pain. Your new tube does not seem to be working properly. You are unable to get feedings into the tube. Your tube comes out for any reason.    This information is not intended to replace advice given to you by your health care provider. Make sure you discuss any questions you have with your health care provider.   Document Released: 12/06/2013 Document Reviewed: 12/06/2013 Elsevier Interactive Patient Education 2016 [place] Insertion, Care After Refer to this sheet in the next few weeks. These instructions provide you with information on caring for yourself after your procedure. Your health care provider may also give you more specific instructions. Your treatment has been planned according to current medical practices, but problems sometimes occur. Call your health care provider if you have any problems or questions after your procedure. WHAT TO EXPECT AFTER THE PROCEDURE After your procedure, it is typical to have the following:  Discomfort at the port insertion site. Ice packs to the area will help. Bruising on the skin over the port. This will subside in 3-4 days. HOME CARE INSTRUCTIONS After your port is placed, you will get a manufacturer's information card. The card has information about your port. Keep this card with you at all times.  Know what kind of port you have. There are many types of ports available.  Wear a medical alert bracelet in case of an emergency. This can help alert health care workers that you have a port.  The port can stay in for as long as your health care provider believes it is necessary.  A home health care nurse may give medicines and take care of the port.  You  or a family member can get special training and directions for giving medicine and taking care of the port at home.  SEEK MEDICAL CARE IF:  Your port does not flush or you are unable to get a blood return.  You have a fever or chills. SEEK IMMEDIATE MEDICAL CARE IF: You have new fluid or pus coming from your incision.  You notice a bad smell coming from your incision site.  You have swelling, pain, or more redness at the incision or port site.  You have chest pain or shortness of breath.   This information is not intended to replace advice given to you by your health care provider. Make sure you discuss any questions you have with your health care provider.   Document Released: 03/01/2013 Document Revised: 05/16/2013 Document Reviewed: 03/01/2013 Elsevier Interactive Patient Education [employer].

## 2015-08-20 NOTE — H&P (Signed)
Chief Complaint: squamous cell carcinoma of left tonsil  Referring Physician:Dr. Ancil Linsey  Supervising Physician: Markus Daft  HPI: Frank Castillo is an 65 y.o. male who was diagnosed with tonsillar cancer.  He is followed by Dr. Whitney Muse.  The plan is to start chemotherapy and radiation.  He is sent to [place] for placement of a port a cath and a PEG.  He has been feeling well recently with no complaints.    Past Medical History:  Past Medical History  Diagnosis Date  . Tonsillar mass     left  . Hypertension   . Cirrhosis ([place])   . Portal hypertension ([facility])     Past Surgical History:  Past Surgical History  Procedure Laterality Date  . Hernia repair Right   . Mouth surgery    . Tonsillectomy Left 07/09/2015    Procedure: LEFT TONSILLECTOMY;  Surgeon: Leta Baptist, MD;  Location: [place];  Service: ENT;  Laterality: Left;    Family History:  Family History  Problem Relation Age of Onset  . Macular degeneration Mother   . Alcoholism Father     Social History:  reports that he has been smoking Cigarettes.  He has been smoking about 0.50 packs per day. He has never used smokeless tobacco. He reports that he does not drink alcohol or use illicit drugs.  Allergies:  Allergies  Allergen Reactions  . Bee Venom Swelling    Medications:   Medication List    ASK your doctor about these medications        cetirizine 10 MG tablet  Commonly known as:  ZYRTEC  Take 10 mg by mouth daily.     cholecalciferol 1000 units tablet  Commonly known as:  VITAMIN D  Take 1,000 Units by mouth daily.     CISPLATIN IV  Inject into the vein every 21 ( twenty-one) days.     lidocaine-prilocaine cream  Commonly known as:  EMLA  Apply a quarter size amount to port site 1 hour prior to chemo. Do not rub in. Cover with plastic wrap.     lisinopril-hydrochlorothiazide 20-25 MG tablet  Commonly known as:  PRINZIDE,ZESTORETIC  Take 1 tablet by mouth daily.     Melatonin 10 MG Tabs  Take by mouth.     NEULASTA ONPRO [place]  Inject into the skin. Every 21 days     nicotine 21 mg/24hr patch  Commonly known as:  NICODERM CQ  apply 21 mg patch daily x6wk, then 14 mg patch daily x2wk, then 7 mg patch daily x2wk     nicotine 7 mg/24hr patch  Commonly known as:  NICODERM CQ  apply 21 mg patch daily x6wk, then 14 mg patch daily x2wk, then 7 mg patch daily x2wk     nicotine 14 mg/24hr patch  Commonly known as:  NICODERM CQ  apply 21 mg patch daily x6wk, then 14 mg patch daily x2wk, then 7 mg patch daily x2wk     ondansetron 8 MG disintegrating tablet  Commonly known as:  ZOFRAN ODT  Take 1 tablet (8 mg total) by mouth every 8 (eight) hours as needed for nausea or vomiting.     oxyCODONE 5 MG/5ML solution  Commonly known as:  ROXICODONE  Take 5-10 mLs (5-10 mg total) by mouth every 4 (four) hours as needed for severe pain.     prochlorperazine 10 MG tablet  Commonly known as:  COMPAZINE  Take 1 tablet (10 mg total) by mouth every  6 (six) hours as needed for nausea or vomiting.     sodium fluoride 1.1 % Gel dental gel  Commonly known as:  FLUORISHIELD  Instill one drop of gel per tooth space of fluoride tray. Place over teeth for 5 minutes. Remove. Spit out excess. Repeat nightly.        Please HPI for pertinent positives, otherwise complete 10 system ROS negative.  Mallampati Score: MD Evaluation Airway: WNL Heart: WNL Abdomen: WNL Chest/ Lungs: WNL ASA  Classification: 2 Mallampati/Airway Score: Two  Physical Exam: BP 132/79 mmHg  Pulse 81  Temp(Src) 97.9 F (36.6 C) (Oral)  Resp 18  SpO2 96% There is no weight on file to calculate BMI. General: pleasant, WD, WN white male who is laying in bed in NAD HEENT: head is normocephalic, atraumatic.  Sclera are noninjected.  PERRL.  Ears and nose without any masses or lesions.  Mouth is pink and moist Heart: regular, rate, and rhythm.  Normal s1,s2. No obvious murmurs, gallops, or rubs  noted.  Palpable radial and pedal pulses bilaterally Lungs: CTAB, no wheezes, rhonchi, or rales noted.  Respiratory effort nonlabored Abd: soft, NT, ND, +BS, no masses, hernias, or organomegaly MS: all 4 extremities are symmetrical with no cyanosis, clubbing, or edema. Skin: warm and dry with no masses, lesions, or rashes Psych: A&Ox3 with an appropriate affect.   Labs: No results found for this or any previous visit (from the past 48 hour(s)).  Imaging: No results found.  Assessment/Plan 1. Squamous cell carcinoma of left tonsil, Stage IV A -patient has been NPO, on no blood thinners, and all vitals and labs have been reviewed and are normal -will plan to proceed today with PAC placement and g-tube placement -Risks and Benefits discussed with the patient including, but not limited to bleeding, infection, pneumothorax, or fibrin sheath development and need for additional procedures. All of the patient's questions were answered, patient is agreeable to proceed. Consent signed and in chart. -Risks and Benefits discussed with the patient including, but not limited to the need for a barium enema during the procedure, bleeding, infection, peritonitis, or damage to adjacent structures. All of the patient's questions were answered, patient is agreeable to proceed. Consent signed and in chart.   Thank you for this interesting consult.  I greatly enjoyed meeting Frank Castillo and look forward to participating in their care.  A copy of this report was sent to the requesting provider on this date.  Electronically Signed: Henreitta Cea 08/20/2015, 1:38 PM   I spent a total of  40 Minutes  in face to face in clinical consultation, greater than 50% of which was counseling/coordinating care for tonsillar cancer, needs PAC and g-tube

## 2015-08-20 NOTE — Procedures (Signed)
Placement of right jugular portacath, tip at SVC/RA junction.  Placement of 20 French gastrostomy tube.  No immediate complication.  See full report in Imaging/PACS.

## 2015-08-21 ENCOUNTER — Ambulatory Visit (HOSPITAL_COMMUNITY): Payer: Self-pay

## 2015-08-21 ENCOUNTER — Encounter (HOSPITAL_BASED_OUTPATIENT_CLINIC_OR_DEPARTMENT_OTHER): Payer: BLUE CROSS/BLUE SHIELD | Admitting: Hematology & Oncology

## 2015-08-21 ENCOUNTER — Encounter (HOSPITAL_COMMUNITY): Payer: Self-pay | Admitting: Hematology & Oncology

## 2015-08-21 ENCOUNTER — Encounter (HOSPITAL_BASED_OUTPATIENT_CLINIC_OR_DEPARTMENT_OTHER): Payer: BLUE CROSS/BLUE SHIELD

## 2015-08-21 VITALS — BP 144/80 | HR 85 | Temp 98.2°F | Resp 18 | Wt 182.0 lb

## 2015-08-21 VITALS — BP 138/73 | HR 81 | Temp 97.8°F | Resp 18

## 2015-08-21 DIAGNOSIS — K766 Portal hypertension: Secondary | ICD-10-CM | POA: Diagnosis not present

## 2015-08-21 DIAGNOSIS — Z5189 Encounter for other specified aftercare: Secondary | ICD-10-CM

## 2015-08-21 DIAGNOSIS — C090 Malignant neoplasm of tonsillar fossa: Secondary | ICD-10-CM

## 2015-08-21 DIAGNOSIS — Z720 Tobacco use: Secondary | ICD-10-CM

## 2015-08-21 DIAGNOSIS — C099 Malignant neoplasm of tonsil, unspecified: Secondary | ICD-10-CM

## 2015-08-21 DIAGNOSIS — K7460 Unspecified cirrhosis of liver: Secondary | ICD-10-CM | POA: Diagnosis not present

## 2015-08-21 DIAGNOSIS — Z5111 Encounter for antineoplastic chemotherapy: Secondary | ICD-10-CM

## 2015-08-21 MED ORDER — SODIUM CHLORIDE 0.9 % IV SOLN
100.0000 mg/m2 | Freq: Once | INTRAVENOUS | Status: AC
  Administered 2015-08-21: 199 mg via INTRAVENOUS
  Filled 2015-08-21: qty 199

## 2015-08-21 MED ORDER — SODIUM CHLORIDE 0.9 % IV SOLN
Freq: Once | INTRAVENOUS | Status: AC
  Administered 2015-08-21: 11:00:00 via INTRAVENOUS
  Filled 2015-08-21: qty 5

## 2015-08-21 MED ORDER — PALONOSETRON HCL INJECTION 0.25 MG/5ML
0.2500 mg | Freq: Once | INTRAVENOUS | Status: AC
  Administered 2015-08-21: 0.25 mg via INTRAVENOUS
  Filled 2015-08-21: qty 5

## 2015-08-21 MED ORDER — HEPARIN SOD (PORK) LOCK FLUSH 100 UNIT/ML IV SOLN
500.0000 [IU] | Freq: Once | INTRAVENOUS | Status: AC | PRN
  Administered 2015-08-21: 500 [IU]
  Filled 2015-08-21: qty 5

## 2015-08-21 MED ORDER — PEGFILGRASTIM 6 MG/0.6ML ~~LOC~~ PSKT
6.0000 mg | PREFILLED_SYRINGE | Freq: Once | SUBCUTANEOUS | Status: AC
  Administered 2015-08-21: 6 mg via SUBCUTANEOUS
  Filled 2015-08-21: qty 0.6

## 2015-08-21 MED ORDER — POTASSIUM CHLORIDE 2 MEQ/ML IV SOLN
Freq: Once | INTRAVENOUS | Status: AC
  Administered 2015-08-21: 09:00:00 via INTRAVENOUS
  Filled 2015-08-21: qty 10

## 2015-08-21 MED ORDER — SODIUM CHLORIDE 0.9% FLUSH
10.0000 mL | INTRAVENOUS | Status: DC | PRN
  Administered 2015-08-21: 10 mL
  Filled 2015-08-21: qty 10

## 2015-08-21 NOTE — Patient Instructions (Addendum)
[place] at [facility] Discharge Instructions  RECOMMENDATIONS MADE BY THE CONSULTANT AND ANY TEST RESULTS WILL BE SENT TO YOUR REFERRING PHYSICIAN.   Exam and discussion by Dr Whitney Muse today Chemotherapy today  If you take your nausea medication and it does not help please call us and we can get you fluids and nausea mediation through your IV  Return to see the doctor next week to see how you tolerated chemotherapy and for fluids  Chemotherapy in 3 week  Please call the clinic if you have any questions or concerns     Thank you for choosing [place] at [facility] to provide your oncology and hematology care.  To afford each patient quality time with our provider, please arrive at least 15 minutes before your scheduled appointment time.   Beginning January 23rd 2017 lab work for the [facility] will be done in the  Main lab at [facility] on 1st floor. If you have a lab appointment with the [facility] please come in thru the  Main Entrance and check in at the main information desk  You need to re-schedule your appointment should you arrive 10 or more minutes late.  We strive to give you quality time with our providers, and arriving late affects you and other patients whose appointments are after yours.  Also, if you no show three or more times for appointments you may be dismissed from the clinic at the providers discretion.     Again, thank you for choosing [facility].  Our hope is that these requests will decrease the amount of time that you wait before being seen by our physicians.       _____________________________________________________________  Should you have questions after your visit to [facility], please contact our office at [phone] between the hours of 8:30 a.m. and 4:30 p.m.  Voicemails left after 4:30 p.m. will not be returned until the following business day.  For prescription refill  requests, have your pharmacy contact our office.         Resources For Cancer Patients and their Caregivers ? American Cancer Society: Can assist with transportation, wigs, general needs, runs Look Good Feel Better.        (435) 408-7665 ? Cancer Care: Provides financial assistance, online support groups, medication/co-pay assistance.  1-800-813-HOPE 508 468 8027) ? [facility] Assists [place] cancer patients and their families through emotional , educational and financial support.  303-217-9552 ? [place] DSS Where to apply for food stamps, Medicaid and utility assistance. 367-027-4599 ? [employer]: Transportation to medical appointments. (501)346-0813 ? Social Security Administration: May apply for disability if have a Stage IV cancer. (219)639-1586 (325) 622-7983 ? [employer], Disability and Transit Services: Assists with nutrition, care and transit needs. 9101015466

## 2015-08-21 NOTE — Patient Instructions (Signed)
[facility] Discharge Instructions for Patients Receiving Chemotherapy   Beginning January 23rd 2017 lab work for the Cancer Center will be done in the  Main lab at [place] on 1st floor. If you have a lab appointment with the Cancer Center please come in thru the  Main Entrance and check in at the main information desk   Today you received the following chemotherapy agent: Cisplatin.     If you develop nausea and vomiting, or diarrhea that is not controlled by your medication, call the clinic.  The clinic phone number is [phone]. Office hours are Monday-Friday 8:30am-5:00pm.  BELOW ARE SYMPTOMS THAT SHOULD BE REPORTED IMMEDIATELY:  *FEVER GREATER THAN 101.0 F  *CHILLS WITH OR WITHOUT FEVER  NAUSEA AND VOMITING THAT IS NOT CONTROLLED WITH YOUR NAUSEA MEDICATION  *UNUSUAL SHORTNESS OF BREATH  *UNUSUAL BRUISING OR BLEEDING  TENDERNESS IN MOUTH AND THROAT WITH OR WITHOUT PRESENCE OF ULCERS  *URINARY PROBLEMS  *BOWEL PROBLEMS  UNUSUAL RASH Items with * indicate a potential emergency and should be followed up as soon as possible. If you have an emergency after office hours please contact your primary care physician or go to the nearest emergency department.  Please call the clinic during office hours if you have any questions or concerns.   You may also contact the Patient Navigator at [phone] should you have any questions or need assistance in obtaining follow up care.      Resources For Cancer Patients and their Caregivers ? American Cancer Society: Can assist with transportation, wigs, general needs, runs Look Good Feel Better.        1-888-227-6333 ? Cancer Care: Provides financial assistance, online support groups, medication/co-pay assistance.  1-800-813-HOPE (4673) ? [facility] Assists [place] cancer patients and their families through emotional , educational and financial support.   336-427-4357 ? [place] DSS Where to apply for food stamps, Medicaid and utility assistance. 336-342-1394 ? [employer]: Transportation to medical appointments. 336-347-2287 ? Social Security Administration: May apply for disability if have a Stage IV cancer. 336-342-7796 1-800-772-1213 ? [place] Aging, Disability and Transit Services: Assists with nutrition, care and transit needs. 336-349-2343          

## 2015-08-21 NOTE — Progress Notes (Signed)
Patient tolerated infusion well.  He was re-educated on the importance of PO hydration.  Dressing to PEG tube was CDI.  VSS.

## 2015-08-21 NOTE — Progress Notes (Signed)
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 He is seen at [facility], Hepatitis C negative CT neck with contrast 06/25/2015 at [place] level 2 malignant LAD, largest node is 3.5 cm long axis. R worse than L carotid atherosclerosis and stenosis. CT chest with no acute or significant findings within the chest   HISTORY OF PRESENTING ILLNESS:  Frank Castillo 65 y.o. male is here because of referral by Dr. Benjamine Mola for evaluation of Left tonsil invasive squamous cell carcinoma. Left tonsillectomy performed on 07/09/2015 with Dr. Benjamine Mola.  Frank Castillo was here with his sister today. He is starting cycle #1 Cisplatin today.  He is excited to start his chemotherapy today. He said that he has been preparing for this for 2 months now.   He took 1 pain pill this morning and is sore but he said that this is to be expected. He had his FT and port placed yesterday.Marland Kitchen  His sister asked about allergic reactions to the chemotherapy.  He has a radiation appointment this afternoon.  His next chemotherapy treatment is in 3 weeks. He is aware that he will be seen here weekly. He has a pre-set schedule for IVF as well.   MEDICAL HISTORY:  Past Medical History  Diagnosis Date  . Tonsillar mass     left  . Hypertension   . Cirrhosis ([place])   . Portal hypertension ([place])     SURGICAL HISTORY: Past Surgical History  Procedure Laterality Date  . Hernia repair Right   . Mouth surgery    . Tonsillectomy Left 07/09/2015    Procedure: LEFT TONSILLECTOMY;  Surgeon: Leta Baptist, MD;  Location: [place];  Service: ENT;  Laterality: Left;    SOCIAL HISTORY: Social  History   Social History  . Marital Status: Single    Spouse Name: N/A  . Number of Children: 0  . Years of Education: N/A   Occupational History  . Not on file.   Social History Main Topics  . Smoking status: Current Every Day Smoker -- 0.50 packs/day    Types: Cigarettes  . Smokeless tobacco: Never Used  . Alcohol Use: No     Comment: former abuse- quit 09/2011  . Drug Use: No  . Sexual Activity: Not on file   Other Topics Concern  . Not on file   Social History Narrative   Divorced.   No children.   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   Currently smoking approximately one pack per day.   Has been smoking off/on since age 33.   Divorced No children. Just step-children. 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 Previously worked in [employer]. Originally from [place], [place]. Current smoker at 1 ppd. Began smoking at [age] yo, regularly smoking at 26-21 yo. During his "cocaine years" he smoked about 2 ppd. ETOH, recovering alcoholic. 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 He enjoys reading. 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  FAMILY HISTORY: Family History  Problem Relation Age of Onset  . Macular degeneration Mother   . Alcoholism Father    indicated that his mother is alive. He indicated that his father is deceased.   Mother still living at 64 years-old, with macular  degeneration. She has no other health issues. Father deceased. Parents divorced at 60 years-old, so he is not sure what he died from. Father was an alcoholic. 4 sisters. 1 sister died of suicide. 1 sister was adopted. 1 Sister lives in [place]. 1 Sister lives in [place].  ALLERGIES:  is allergic to bee venom.  MEDICATIONS:  Current Outpatient Prescriptions  Medication Sig Dispense Refill  . cetirizine (ZYRTEC) 10 MG tablet Take 10 mg by mouth daily.    . cholecalciferol (VITAMIN D) 1000 units tablet Take 1,000 Units by mouth daily.    Marland Kitchen CISPLATIN IV Inject into the vein  every 21 ( twenty-one) days.    Marland Kitchen lidocaine-prilocaine (EMLA) cream Apply a quarter size amount to port site 1 hour prior to chemo. Do not rub in. Cover with plastic wrap. 30 g 3  . lisinopril-hydrochlorothiazide (PRINZIDE,ZESTORETIC) 20-25 MG tablet Take 1 tablet by mouth daily.  11  . Melatonin 10 MG TABS Take by mouth.    . nicotine (NICODERM CQ) 14 mg/24hr patch apply 21 mg patch daily x6wk, then 14 mg patch daily x2wk, then 7 mg patch daily x2wk 14 patch 0  . nicotine (NICODERM CQ) 21 mg/24hr patch apply 21 mg patch daily x6wk, then 14 mg patch daily x2wk, then 7 mg patch daily x2wk 21 patch 1  . nicotine (NICODERM CQ) 7 mg/24hr patch apply 21 mg patch daily x6wk, then 14 mg patch daily x2wk, then 7 mg patch daily x2wk 14 patch 0  . ondansetron (ZOFRAN ODT) 8 MG disintegrating tablet Take 1 tablet (8 mg total) by mouth every 8 (eight) hours as needed for nausea or vomiting. 30 tablet 2  . oxyCODONE (ROXICODONE) 5 MG/5ML solution Take 5-10 mLs (5-10 mg total) by mouth every 4 (four) hours as needed for severe pain. 250 mL 0  . oxyCODONE-acetaminophen (ROXICET) 5-325 MG tablet Take 1-2 tablets by mouth every 4 (four) hours as needed for severe pain. 30 tablet 0  . Pegfilgrastim (NEULASTA ONPRO [place]) Inject into the skin. Every 21 days    . prochlorperazine (COMPAZINE) 10 MG tablet Take 1 tablet (10 mg total) by mouth every 6 (six) hours as needed for nausea or vomiting. 30 tablet 2  . sodium fluoride (FLUORISHIELD) 1.1 % GEL dental gel Instill one drop of gel per tooth space of fluoride tray. Place over teeth for 5 minutes. Remove. Spit out excess. Repeat nightly. 120 mL prn   No current facility-administered medications for this visit.   Facility-Administered Medications Ordered in Other Visits  Medication Dose Route Frequency Provider Last Rate Last Dose  . CISplatin (PLATINOL) 199 mg in sodium chloride 0.9 % 500 mL chemo infusion  100 mg/m2 (Treatment Plan Actual) Intravenous Once Patrici Ranks, MD      . dextrose 5 % and 0.45% NaCl 1,000 mL with potassium chloride 20 mEq, magnesium sulfate 12 mEq, mannitol 12.5 g infusion   Intravenous Once Patrici Ranks, MD      . fosaprepitant (EMEND) 150 mg, dexamethasone (DECADRON) 12 mg in sodium chloride 0.9 % 145 mL IVPB   Intravenous Once Patrici Ranks, MD      . heparin lock flush 100 unit/mL  500 Units Intracatheter Once PRN Patrici Ranks, MD      . palonosetron (ALOXI) injection 0.25 mg  0.25 mg Intravenous Once Patrici Ranks, MD      . pegfilgrastim (NEULASTA ONPRO KIT) injection 6 mg  6 mg Subcutaneous Once Patrici Ranks, MD      .  sodium chloride flush (NS) 0.9 % injection 10 mL  10 mL Intracatheter PRN Patrici Ranks, MD        Review of Systems  Constitutional: Negative for fever, chills and malaise/fatigue.  HENT: Negative.  Negative for congestion, hearing loss, nosebleeds, sore throat and tinnitus.        L neck mass  Eyes: Negative.  Negative for blurred vision, double vision, pain and discharge.  Respiratory: Negative.  Negative for cough, hemoptysis, sputum production, shortness of breath and wheezing.   Cardiovascular: Negative.  Negative for chest pain, palpitations, claudication, leg swelling and PND.  Gastrointestinal: Negative.  Negative for heartburn, nausea, vomiting, abdominal pain, diarrhea, constipation, blood in stool and melena.  Genitourinary: Negative.  Negative for dysuria, urgency, frequency and hematuria.  Musculoskeletal: Negative.  Negative for myalgias, joint pain and falls.  Skin: Negative.  Negative for itching and rash.  Neurological: Negative.  Negative for dizziness, tingling, tremors, sensory change, speech change, focal weakness, seizures, loss of consciousness, weakness and headaches.  Endo/Heme/Allergies: Negative.  Does not bruise/bleed easily.  Psychiatric/Behavioral: Negative.  Negative for depression, suicidal ideas, memory loss and substance abuse. The patient is  not nervous/anxious and does not have insomnia.   All other systems reviewed and are negative.  14 point ROS was done and is otherwise as detailed above or in HPI   PHYSICAL EXAMINATION: ECOG PERFORMANCE STATUS: 0 - Asymptomatic Vitals with BMI 08/21/2015  Height   Weight 182 lbs  BMI   Systolic 262  Diastolic 80  Pulse 85  Respirations 18    Physical Exam  Constitutional: He is oriented to person, place, and time and well-developed, well-nourished, and in no distress.  HENT:  Head: Normocephalic and atraumatic.  Nose: Nose normal.  Mouth/Throat: Oropharynx is clear and moist. No oropharyngeal exudate.  Eyes: Conjunctivae and EOM are normal. Pupils are equal, round, and reactive to light. Right eye exhibits no discharge. Left eye exhibits no discharge. No scleral icterus.  Neck: Normal range of motion. Neck supple. No tracheal deviation present. No thyromegaly present.  Two palpable lymph nodes, level 2.  Cardiovascular: Normal rate, regular rhythm and normal heart sounds.  Exam reveals no gallop and no friction rub.  Port site is C/D/I no erythema or bruising No murmur heard. Pulmonary/Chest: Effort normal and breath sounds normal. He has no wheezes. He has no rales.  Abdominal: Soft. Bowel sounds are normal. He exhibits no distension and no mass. There is no tenderness. There is no rebound and no guarding. FT site is dressed Musculoskeletal: Normal range of motion. He exhibits no edema.  Lymphadenopathy:       Head (left side): Submental adenopathy present.    He has no cervical adenopathy.  Neurological: He is alert and oriented to person, place, and time. He has normal reflexes. No cranial nerve deficit. Gait normal. Coordination normal.  Skin: Skin is warm and dry. No rash noted.  Psychiatric: Mood, memory, affect and judgment normal.  Nursing note and vitals reviewed.   LABORATORY DATA:  I have reviewed the data as listed  Results for Frank, Castillo (MRN [id number])  as of 08/21/2015 08:03  Ref. Range 08/20/2015 11:55  Sodium Latest Ref Range: 135-145 mmol/L 136  Potassium Latest Ref Range: 3.5-5.1 mmol/L 4.0  Chloride Latest Ref Range: 101-111 mmol/L 104  CO2 Latest Ref Range: 22-32 mmol/L 24  BUN Latest Ref Range: 6-20 mg/dL 20  Creatinine Latest Ref Range: 0.61-1.24 mg/dL 1.00  Calcium Latest Ref Range: 8.9-10.3 mg/dL 9.7  EGFR (Non-African Amer.) Latest Ref Range: >60 mL/min >60  EGFR (African American) Latest Ref Range: >60 mL/min >60  Glucose Latest Ref Range: 65-99 mg/dL 104 (H)  Anion gap Latest Ref Range: 5-15  8  WBC Latest Ref Range: 4.0-10.5 K/uL 8.4  RBC Latest Ref Range: 4.22-5.81 MIL/uL 4.70  Hemoglobin Latest Ref Range: 13.0-17.0 g/dL 13.1  HCT Latest Ref Range: 39.0-52.0 % 40.0  MCV Latest Ref Range: 78.0-100.0 fL 85.1  MCH Latest Ref Range: 26.0-34.0 pg 27.9  MCHC Latest Ref Range: 30.0-36.0 g/dL 32.8  RDW Latest Ref Range: 11.5-15.5 % 15.5  Platelets Latest Ref Range: 150-400 K/uL 253  Neutrophils Latest Units: % 57  Lymphocytes Latest Units: % 33  Monocytes Relative Latest Units: % 7  Eosinophil Latest Units: % 2  Basophil Latest Units: % 1  NEUT# Latest Ref Range: 1.7-7.7 K/uL 4.8  Lymphocyte # Latest Ref Range: 0.7-4.0 K/uL 2.8  Monocyte # Latest Ref Range: 0.1-1.0 K/uL 0.6  Eosinophils Absolute Latest Ref Range: 0.0-0.7 K/uL 0.2  Basophils Absolute Latest Ref Range: 0.0-0.1 K/uL 0.1  Prothrombin Time Latest Ref Range: 11.6-15.2 seconds 13.9  INR Latest Ref Range: 0.00-1.49  1.05      RADIOGRAPHIC STUDIES: I have personally reviewed the radiological reports as listed. Study Result     INDICATION: 65 year old with squamous cell carcinoma of the left tonsil. Port-A-Cath was placed immediately prior to placement of the gastrostomy tube.  EXAM: PERCUTANEOUS GASTROSTOMY TUBE WITH FLUOROSCOPIC GUIDANCE  Physician: Stephan Minister. Henn, MD  MEDICATIONS: Ancef 2 g was given prior to placement of the Port-A-Cath.  Glucagon 1 mg IV  ANESTHESIA/SEDATION: Versed 2.0 mg IV; Fentanyl 25 mcg IV  Moderate Sedation Time: 11 minutes  The patient was continuously monitored during the procedure by the interventional radiology nurse under my direct supervision.  FLUOROSCOPY TIME: Fluoroscopy Time: 1 minutes, 24 seconds. 22 mGy  CONTRAST: 5 mL Omnipaque [id number]  COMPLICATIONS: None immediate.  PROCEDURE: Informed consent was obtained for a percutaneous gastrostomy tube. The patient was placed on the interventional table. Fluoroscopy demonstrated oral contrast in the transverse colon. An orogastric tube was placed with fluoroscopic guidance. The anterior abdomen was prepped and draped in sterile fashion. Maximal barrier sterile technique was utilized including caps, mask, sterile gowns, sterile gloves, sterile drape, hand hygiene and skin antiseptic. Stomach was inflated with air through the orogastric tube. The skin and subcutaneous tissues were anesthetized with 1% lidocaine. A 17 gauge needle was directed into the distended stomach with fluoroscopic guidance. A wire was advanced into the stomach and a T-tact was deployed. A 9-French vascular sheath was placed and the orogastric tube was snared using a Goos[place]ck snare device. The orogastric tube and snare were pulled out of the patient's mouth. The snare device was connected to a 20-French gastrostomy tube. The snare device and gastrostomy tube were pulled through the patient's mouth and out the anterior abdominal wall. The gastrostomy tube was cut to an appropriate length. Contrast injection through gastrostomy tube confirmed placement within the stomach. Fluoroscopic images were obtained for documentation. The gastrostomy tube was flushed with normal saline.  IMPRESSION: Successful fluoroscopic guided percutaneous gastrostomy tube placement.   Electronically Signed  By: Markus Daft M.D.  On: 08/20/2015 16:39    Study Result      INDICATION: 65 year old with squamous cell carcinoma of the left tonsil. Patient scheduled for Port-A-Cath placement and gastrostomy tube placement. Plan for Port-A-Cath placement prior to gastrostomy tube.  EXAM: FLUOROSCOPIC AND ULTRASOUND GUIDED PLACEMENT OF A SUBCUTANEOUS PORT  COMPARISON: None.  MEDICATIONS: Ancef 2 g; The antibiotic was administered within an appropriate time interval prior to skin puncture.  ANESTHESIA/SEDATION: Versed 4.0 mg IV; Fentanyl 100 mcg IV;  Moderate Sedation Time: 25 minutes  The patient was continuously monitored during the procedure by the interventional radiology nurse under my direct supervision.  FLUOROSCOPY TIME: 12 seconds, 3 mGy  COMPLICATIONS: None immediate.  PROCEDURE: The procedure, risks, benefits, and alternatives were explained to the patient. Questions regarding the procedure were encouraged and answered. The patient understands and consents to the procedure.  Patient was placed supine on the interventional table. Ultrasound confirmed a patent right internal jugular vein. The right chest and neck were cleaned with a skin antiseptic and a sterile drape was placed. Maximal barrier sterile technique was utilized including caps, mask, sterile gowns, sterile gloves, sterile drape, hand hygiene and skin antiseptic. The right neck was anesthetized with 1% lidocaine. Small incision was made in the right neck with a blade. Micropuncture set was placed in the right internal jugular vein with ultrasound guidance. The micropuncture wire was used for measurement purposes. The right chest was anesthetized with 1% lidocaine with epinephrine. #15 blade was used to make an incision and a subcutaneous port pocket was formed. [place] was assembled. Subcutaneous tunnel was formed with a stiff tunneling device. The port catheter was brought through the subcutaneous tunnel. The port was placed in the  subcutaneous pocket. The micropuncture set was exchanged for a peel-away sheath. The catheter was placed through the peel-away sheath and the tip was positioned at the superior cavoatrial junction. Catheter placement was confirmed with fluoroscopy. The port was accessed and flushed with heparinized saline. The port pocket was closed using two layers of absorbable sutures and Dermabond. The vein skin site was closed using a single layer of absorbable suture and Dermabond. Sterile dressings were applied. Patient tolerated the procedure well without an immediate complication. Ultrasound and fluoroscopic images were taken and saved for this procedure.  IMPRESSION: Placement of a subcutaneous port device. Catheter tip at the superior cavoatrial junction.   Electronically Signed  By: Markus Daft M.D.  On: 08/20/2015 16:37     Study Result     INDICATION: 65 year old with squamous cell carcinoma of the left tonsil. Patient scheduled for Port-A-Cath placement and gastrostomy tube placement. Plan for Port-A-Cath placement prior to gastrostomy tube.  EXAM: FLUOROSCOPIC AND ULTRASOUND GUIDED PLACEMENT OF A SUBCUTANEOUS PORT  COMPARISON: None.  MEDICATIONS: Ancef 2 g; The antibiotic was administered within an appropriate time interval prior to skin puncture.  ANESTHESIA/SEDATION: Versed 4.0 mg IV; Fentanyl 100 mcg IV;  Moderate Sedation Time: 25 minutes  The patient was continuously monitored during the procedure by the interventional radiology nurse under my direct supervision.  FLUOROSCOPY TIME: 12 seconds, 3 mGy  COMPLICATIONS: None immediate.  PROCEDURE: The procedure, risks, benefits, and alternatives were explained to the patient. Questions regarding the procedure were encouraged and answered. The patient understands and consents to the procedure.  Patient was placed supine on the interventional table. Ultrasound confirmed a patent right internal  jugular vein. The right chest and neck were cleaned with a skin antiseptic and a sterile drape was placed. Maximal barrier sterile technique was utilized including caps, mask, sterile gowns, sterile gloves, sterile drape, hand hygiene and skin antiseptic. The right neck was anesthetized with 1% lidocaine. Small incision was made in the right neck with a blade. Micropuncture set was placed in the right internal jugular vein with ultrasound guidance. The micropuncture  wire was used for measurement purposes. The right chest was anesthetized with 1% lidocaine with epinephrine. #15 blade was used to make an incision and a subcutaneous port pocket was formed. [place] was assembled. Subcutaneous tunnel was formed with a stiff tunneling device. The port catheter was brought through the subcutaneous tunnel. The port was placed in the subcutaneous pocket. The micropuncture set was exchanged for a peel-away sheath. The catheter was placed through the peel-away sheath and the tip was positioned at the superior cavoatrial junction. Catheter placement was confirmed with fluoroscopy. The port was accessed and flushed with heparinized saline. The port pocket was closed using two layers of absorbable sutures and Dermabond. The vein skin site was closed using a single layer of absorbable suture and Dermabond. Sterile dressings were applied. Patient tolerated the procedure well without an immediate complication. Ultrasound and fluoroscopic images were taken and saved for this procedure.  IMPRESSION: Placement of a subcutaneous port device. Catheter tip at the superior cavoatrial junction.   Electronically Signed  By: Markus Daft M.D.  On: 08/20/2015 16:37     Study Result     CLINICAL DATA: Initial treatment strategy for primary tonsillar squamous cell carcinoma. Left-sided lung tonsillectomy.  EXAM: NUCLEAR MEDICINE PET SKULL BASE TO THIGH  TECHNIQUE: 8.8 mCi F-18 FDG  was injected intravenously. Full-ring PET imaging was performed from the skull base to thigh after the radiotracer. CT data was obtained and used for attenuation correction and anatomic localization.  FASTING BLOOD GLUCOSE: Value: 116 mg/dl  COMPARISON: Clinic note of 07/24/2015. Neck and chest CTs of 06/25/2015.  FINDINGS: NECK  No tonsilar hypermetabolism.  Hypermetabolic left-sided level 2 nodes. Index node measures 2.1 cm and a S.U.V. max of 17.9 on image 25/series 4. 1.9 cm on 06/25/2015.  Immediately inferior node measures 9 mm and a S.U.V. max of 3.8 on image 30/series 4.  CHEST  No areas of abnormal hypermetabolism.  ABDOMEN/PELVIS  No areas of abnormal hypermetabolism.  SKELETON  No abnormal marrow activity.  CT IMAGES PERFORMED FOR ATTENUATION CORRECTION  carotid atherosclerosis. Mild cardiomegaly with multivessel coronary artery atherosclerosis. Mild centrilobular emphysema. Mild bibasilar atelectasis. Normal adrenal glands. Gallstones and a contracted gallbladder. Moderate cirrhosis. Advanced abdominal aortic atherosclerosis. Scattered colonic diverticula. Mild prostatomegaly. Fat containing left inguinal hernia. Prior right inguinal hernia repair.  IMPRESSION: 1. Hypermetabolic left-sided level 2 adenopathy, consistent with nodal metastasis. 2. No extracervical metastatic disease identified. 3. Cirrhosis. 4. Atherosclerosis, including within the coronary arteries. 5. Cholelithiasis.   Electronically Signed  By: Abigail Miyamoto M.D.  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 He is seen at [facility], Hepatitis C negative CT neck with contrast 06/25/2015 at [place] level 2 malignant LAD, largest node is 3.5 cm long axis. R worse than L carotid atherosclerosis and stenosis. CT chest  with no acute or significant findings within the chest Stage IVA T2N2bM0   He was advised that his treatment course is not easy; that it's important to recognize that we are here to prevent complications and make him successful in treatment. We discussed the importance of hydration, nutrition, and management of treatment related side effects during the course of his treatment. He knows he will need to let us know if he has any problems during treatment. He will be seen on a weekly basis.   He has not yet met with Ovid Curd for nutritional  guidance. He has also not met with speech therapy yet. He will meet with Ovid Curd with home health to discuss his feeding and nutrition. He has been referred to speech therapy.   The patient was here today to start Cycle #1 of Cisplatin.  He has a radiation appointment this afternoon.  He has a follow up appointment next week and his next chemotherapy treatment is in 3 weeks. He has a calendar with all appointments moving forward.  He is aware to come in or call with problems or concerns such as fever, chills, nausea, vomiting.  All questions were answered. The patient knows to call the clinic with any problems, questions or concerns.  This document serves as a record of services personally performed by Ancil Linsey, MD. It was created on her behalf by Kandace Blitz, a trained medical scribe. The creation of this record is based on the scribe's personal observations and the provider's statements to them. This document has been checked and approved by the attending provider.  I have reviewed the above documentation for accuracy and completeness, and I agree with the above.  This note was electronically signed.    Molli Hazard, MD  08/21/2015 8:56 AM

## 2015-08-22 ENCOUNTER — Encounter (HOSPITAL_COMMUNITY): Payer: Self-pay

## 2015-08-23 ENCOUNTER — Encounter (HOSPITAL_BASED_OUTPATIENT_CLINIC_OR_DEPARTMENT_OTHER): Payer: BLUE CROSS/BLUE SHIELD

## 2015-08-23 ENCOUNTER — Encounter: Payer: Self-pay | Admitting: Dietician

## 2015-08-23 VITALS — BP 137/75 | HR 65 | Temp 97.8°F | Resp 18

## 2015-08-23 DIAGNOSIS — C099 Malignant neoplasm of tonsil, unspecified: Secondary | ICD-10-CM | POA: Diagnosis not present

## 2015-08-23 MED ORDER — JEVITY 1.2 CAL/FIBER PO LIQD: ORAL | Status: DC

## 2015-08-23 MED ORDER — PANTOPRAZOLE SODIUM 40 MG PO TBEC: 40.0000 mg | DELAYED_RELEASE_TABLET | Freq: Every day | ORAL | Status: DC

## 2015-08-23 MED ORDER — PANTOPRAZOLE SODIUM 40 MG IV SOLR
40.0000 mg | Freq: Once | INTRAVENOUS | Status: AC
  Administered 2015-08-23: 40 mg via INTRAVENOUS
  Filled 2015-08-23: qty 40

## 2015-08-23 MED ORDER — HEPARIN SOD (PORK) LOCK FLUSH 100 UNIT/ML IV SOLN
500.0000 [IU] | Freq: Once | INTRAVENOUS | Status: AC
  Administered 2015-08-23: 500 [IU] via INTRAVENOUS

## 2015-08-23 MED ORDER — SODIUM CHLORIDE 0.9 % IV SOLN
INTRAVENOUS | Status: DC
  Administered 2015-08-23: 10:00:00 via INTRAVENOUS

## 2015-08-23 MED ORDER — HEPARIN SOD (PORK) LOCK FLUSH 100 UNIT/ML IV SOLN
INTRAVENOUS | Status: AC
  Filled 2015-08-23: qty 5

## 2015-08-23 NOTE — Progress Notes (Signed)
Patient tolerated infusion well.  VSS.  IV protonix given per MD order for c/o "indigestion".  Order also sent into [facility] for protonix PO daily, patient aware.

## 2015-08-23 NOTE — Addendum Note (Signed)
Addended by: Kurtis Bushman A on: 08/23/2015 02:47 PM   Modules accepted: Orders

## 2015-08-23 NOTE — Progress Notes (Signed)
Patient called for 24 hour chemotherapy follow up.  Patient reports mild nausea without any change in PO intake or emesis.  He took his nausea medication that we provided for home use and it was relieved.  He is to come in for fluids tomorrow and is encouraged to call with any further concerns or questions before then.  He verbalized understanding.

## 2015-08-23 NOTE — Patient Instructions (Signed)
[place] at [facility] Discharge Instructions  RECOMMENDATIONS MADE BY THE CONSULTANT AND ANY TEST RESULTS WILL BE SENT TO YOUR REFERRING PHYSICIAN.  IV fluids today.   Please [facility] from [facility] and start taking tomorrow as directed.    Thank you for choosing [place] at [facility] to provide your oncology and hematology care.  To afford each patient quality time with our provider, please arrive at least 15 minutes before your scheduled appointment time.   Beginning January 23rd 2017 lab work for the [facility] will be done in the  Main lab at [facility] on 1st floor. If you have a lab appointment with the [facility] please come in thru the  Main Entrance and check in at the main information desk  You need to re-schedule your appointment should you arrive 10 or more minutes late.  We strive to give you quality time with our providers, and arriving late affects you and other patients whose appointments are after yours.  Also, if you no show three or more times for appointments you may be dismissed from the clinic at the providers discretion.     Again, thank you for choosing [facility].  Our hope is that these requests will decrease the amount of time that you wait before being seen by our physicians.       _____________________________________________________________  Should you have questions after your visit to [facility], please contact our office at [phone] between the hours of 8:30 a.m. and 4:30 p.m.  Voicemails left after 4:30 p.m. will not be returned until the following business day.  For prescription refill requests, have your pharmacy contact our office.         Resources For Cancer Patients and their Caregivers ? American Cancer Society: Can assist with transportation, wigs, general needs, runs Look Good Feel Better.        9054097952 ? Cancer Care: Provides financial  assistance, online support groups, medication/co-pay assistance.  1-800-813-HOPE (612) 615-5800) ? [place] Assists [place] cancer patients and their families through emotional , educational and financial support.  610-486-8880 ? [place] DSS Where to apply for food stamps, Medicaid and utility assistance. 985-345-3281 ? [employer]: Transportation to medical appointments. (579) 491-9927 ? Social Security Administration: May apply for disability if have a Stage IV cancer. ([phone] [phone] ? [employer], Disability and Transit Services: Assists with nutrition, care and transit needs. [phone]

## 2015-08-23 NOTE — Progress Notes (Signed)
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 Undergoing Concomitant radiation + chemoradiotherapy . PEG has been placed.  RD to meet and go over TF regimen.   Contacted Pt by Visiting during IVF.   Wt Readings from Last 10 Encounters:  08/21/15 182 lb (82.555 kg)  08/02/15 183 lb 3.2 oz (83.099 kg)  07/31/15 180 lb 6.4 oz (81.829 kg)  07/24/15 176 lb (79.833 kg)  07/09/15 179 lb (81.194 kg)   Patient weight is stable  Patient reports oral intake as good, but starting to be affected. He says he has begun to lose his appetite and he has noticed some slight taste changes. He also had some nausea and bloating. He has not had any radiation induced discomfort at this time.   Discussed with pt his TF prescription which is listed below.  Explained TF is not an Chief Strategy Officer and everyone can have different levels of tolerance. Emphasized the importance of contacting RD if he experiences discomfort with the prescribed regimen so it could be altered.   Went over flushing, bolus vs gravity hang, administering medications, skin care to area, when to refrigerate/discard formula.   Again, educated that his flushes are just as important as the feeding as dehydration is a very severe potential complication.   Since he is still eating fairly well, asked him to start with a few cans a day and add 2 cans for every meal he does not eat. Goal is 8 cans a day.  Pt was accepting of the recommendations and seemed to understand all instructions.   RD to follow as he goes through chemo/radiation. Pt has RD information to contact if needed.   Pt's last anthropometrics:  Wt: 182 lbs (82.55 kg) Ht: 5' 10' Bmi: 26.2  Estimated nutritional Requirements:  Kcals: 2050-2300 kcals (27-30 kcal/kg ibw) Protein: 90-106 g (1.2-1.4 g/kg IBW) Fluid: > 2.3 liters  TF regimen: Jevity 1.2. 8 cans. Divided into 4 meals of 2 cans each.  Flush with 100 mls before and after each meal for extra 800 ccs fluid  TF to provide:  2275 kcals 106 g Pro 1528 liters fluid  Left my contact info and handouts titled "Feeding Tube Use and Care (Bolus/Syringe Feedings)"  [name] RD, LDN Nutrition Pager: [phone] 08/23/2015 9:26 AM

## 2015-08-26 ENCOUNTER — Encounter (HOSPITAL_COMMUNITY): Payer: BLUE CROSS/BLUE SHIELD | Attending: Hematology & Oncology

## 2015-08-26 VITALS — BP 162/85 | HR 74 | Temp 97.9°F | Resp 16

## 2015-08-26 DIAGNOSIS — R05 Cough: Secondary | ICD-10-CM | POA: Insufficient documentation

## 2015-08-26 DIAGNOSIS — R112 Nausea with vomiting, unspecified: Secondary | ICD-10-CM | POA: Insufficient documentation

## 2015-08-26 DIAGNOSIS — C090 Malignant neoplasm of tonsillar fossa: Secondary | ICD-10-CM

## 2015-08-26 DIAGNOSIS — B370 Candidal stomatitis: Secondary | ICD-10-CM | POA: Insufficient documentation

## 2015-08-26 DIAGNOSIS — C099 Malignant neoplasm of tonsil, unspecified: Secondary | ICD-10-CM

## 2015-08-26 DIAGNOSIS — Z95828 Presence of other vascular implants and grafts: Secondary | ICD-10-CM | POA: Insufficient documentation

## 2015-08-26 DIAGNOSIS — L739 Follicular disorder, unspecified: Secondary | ICD-10-CM | POA: Insufficient documentation

## 2015-08-26 MED ORDER — HEPARIN SOD (PORK) LOCK FLUSH 100 UNIT/ML IV SOLN
500.0000 [IU] | Freq: Once | INTRAVENOUS | Status: AC
  Administered 2015-08-26: 500 [IU] via INTRAVENOUS

## 2015-08-26 MED ORDER — SODIUM CHLORIDE 0.9 % IV SOLN
Freq: Once | INTRAVENOUS | Status: AC
  Administered 2015-08-26: 10:00:00 via INTRAVENOUS

## 2015-08-26 NOTE — Patient Instructions (Signed)
[place] at [facility] Discharge Instructions  RECOMMENDATIONS MADE BY THE CONSULTANT AND ANY TEST RESULTS WILL BE SENT TO YOUR REFERRING PHYSICIAN.  IV fluids today.    Thank you for choosing [place] at [facility] to provide your oncology and hematology care.  To afford each patient quality time with our provider, please arrive at least 15 minutes before your scheduled appointment time.   Beginning January 23rd 2017 lab work for the [facility] will be done in the  Main lab at [facility] on 1st floor. If you have a lab appointment with the [facility] please come in thru the  Main Entrance and check in at the main information desk  You need to re-schedule your appointment should you arrive 10 or more minutes late.  We strive to give you quality time with our providers, and arriving late affects you and other patients whose appointments are after yours.  Also, if you no show three or more times for appointments you may be dismissed from the clinic at the providers discretion.     Again, thank you for choosing [facility].  Our hope is that these requests will decrease the amount of time that you wait before being seen by our physicians.       _____________________________________________________________  Should you have questions after your visit to [facility], please contact our office at [phone] between the hours of 8:30 a.m. and 4:30 p.m.  Voicemails left after 4:30 p.m. will not be returned until the following business day.  For prescription refill requests, have your pharmacy contact our office.         Resources For Cancer Patients and their Caregivers ? American Cancer Society: Can assist with transportation, wigs, general needs, runs Look Good Feel Better.        (705) 064-9488 ? Cancer Care: Provides financial assistance, online support groups, medication/co-pay assistance.   1-800-813-HOPE 831-385-2375) ? [place] Assists [place] cancer patients and their families through emotional , educational and financial support.  (339) 365-8060 ? [place] DSS Where to apply for food stamps, Medicaid and utility assistance. 463-870-1571 ? [employer]: Transportation to medical appointments. [phone] ? Social Security Administration: May apply for disability if have a Stage IV cancer. 6605338144 ([phone] 708-4002 ? [employer], Disability and Transit Services: Assists with nutrition, care and transit needs. [phone]

## 2015-08-26 NOTE — Progress Notes (Signed)
Patient tolerated infusion well.  VSS.   

## 2015-08-28 ENCOUNTER — Encounter (HOSPITAL_COMMUNITY): Payer: Self-pay | Admitting: Hematology & Oncology

## 2015-08-28 ENCOUNTER — Encounter (HOSPITAL_BASED_OUTPATIENT_CLINIC_OR_DEPARTMENT_OTHER): Payer: BLUE CROSS/BLUE SHIELD | Admitting: Hematology & Oncology

## 2015-08-28 ENCOUNTER — Encounter (HOSPITAL_BASED_OUTPATIENT_CLINIC_OR_DEPARTMENT_OTHER): Payer: BLUE CROSS/BLUE SHIELD

## 2015-08-28 VITALS — BP 140/96 | HR 81 | Temp 98.1°F | Resp 18 | Wt 175.8 lb

## 2015-08-28 DIAGNOSIS — K7460 Unspecified cirrhosis of liver: Secondary | ICD-10-CM

## 2015-08-28 DIAGNOSIS — Z95828 Presence of other vascular implants and grafts: Secondary | ICD-10-CM

## 2015-08-28 DIAGNOSIS — K766 Portal hypertension: Secondary | ICD-10-CM | POA: Diagnosis not present

## 2015-08-28 DIAGNOSIS — L739 Follicular disorder, unspecified: Secondary | ICD-10-CM | POA: Diagnosis present

## 2015-08-28 DIAGNOSIS — R110 Nausea: Secondary | ICD-10-CM

## 2015-08-28 DIAGNOSIS — C090 Malignant neoplasm of tonsillar fossa: Secondary | ICD-10-CM

## 2015-08-28 DIAGNOSIS — C099 Malignant neoplasm of tonsil, unspecified: Secondary | ICD-10-CM

## 2015-08-28 DIAGNOSIS — R05 Cough: Secondary | ICD-10-CM | POA: Diagnosis present

## 2015-08-28 DIAGNOSIS — B370 Candidal stomatitis: Secondary | ICD-10-CM | POA: Diagnosis present

## 2015-08-28 DIAGNOSIS — R112 Nausea with vomiting, unspecified: Secondary | ICD-10-CM | POA: Diagnosis present

## 2015-08-28 DIAGNOSIS — R634 Abnormal weight loss: Secondary | ICD-10-CM

## 2015-08-28 LAB — COMPREHENSIVE METABOLIC PANEL
ALK PHOS: 140 U/L — AB (ref 38–126)
ALT: 20 U/L (ref 17–63)
ANION GAP: 9 (ref 5–15)
AST: 15 U/L (ref 15–41)
Albumin: 4.1 g/dL (ref 3.5–5.0)
BUN: 19 mg/dL (ref 6–20)
CALCIUM: 8.9 mg/dL (ref 8.9–10.3)
CO2: 26 mmol/L (ref 22–32)
CREATININE: 0.98 mg/dL (ref 0.61–1.24)
Chloride: 100 mmol/L — ABNORMAL LOW (ref 101–111)
GFR calc non Af Amer: >60 mL/min (ref >60)
GLUCOSE: 99 mg/dL (ref 65–99)
Potassium: 4 mmol/L (ref 3.5–5.1)
Sodium: 135 mmol/L (ref 135–145)
TOTAL PROTEIN: 7.1 g/dL (ref 6.5–8.1)
Total Bilirubin: 0.3 mg/dL (ref 0.3–1.2)

## 2015-08-28 MED ORDER — SODIUM CHLORIDE 0.9 % IV SOLN
4.0000 mg | Freq: Once | INTRAVENOUS | Status: AC
  Administered 2015-08-28: 4 mg via INTRAVENOUS
  Filled 2015-08-28: qty 0.4

## 2015-08-28 MED ORDER — PALONOSETRON HCL INJECTION 0.25 MG/5ML
INTRAVENOUS | Status: AC
Start: 2015-08-28 — End: 2015-08-28
  Filled 2015-08-28: qty 5

## 2015-08-28 MED ORDER — HEPARIN SOD (PORK) LOCK FLUSH 100 UNIT/ML IV SOLN
500.0000 [IU] | Freq: Once | INTRAVENOUS | Status: AC
  Administered 2015-08-28: 500 [IU] via INTRAVENOUS

## 2015-08-28 MED ORDER — SODIUM CHLORIDE 0.9 % IV SOLN
500.0000 mL/h | INTRAVENOUS | Status: DC
  Administered 2015-08-28: 500 mL/h via INTRAVENOUS

## 2015-08-28 MED ORDER — DEXAMETHASONE SODIUM PHOSPHATE 4 MG/ML IJ SOLN: 4.0000 mg | Freq: Once | INTRAMUSCULAR | Status: DC

## 2015-08-28 MED ORDER — LORAZEPAM 2 MG/ML IJ SOLN
INTRAMUSCULAR | Status: AC
  Filled 2015-08-28: qty 1

## 2015-08-28 MED ORDER — PALONOSETRON HCL INJECTION 0.25 MG/5ML
0.2500 mg | Freq: Once | INTRAVENOUS | Status: AC
  Administered 2015-08-28: 0.25 mg via INTRAVENOUS

## 2015-08-28 MED ORDER — LORAZEPAM 2 MG/ML IJ SOLN: 0.5000 mg | Freq: Once | INTRAMUSCULAR | Status: DC

## 2015-08-28 MED ORDER — SODIUM CHLORIDE 0.9% FLUSH: 10.0000 mL | Freq: Once | INTRAVENOUS | Status: DC

## 2015-08-28 MED ORDER — OXYCODONE HCL 5 MG/5ML PO SOLN: 5.0000 mg | ORAL | Status: DC | PRN

## 2015-08-28 NOTE — Assessment & Plan Note (Signed)
His weight is down a few pounds. I have given him several anti-emetics with his IVF today.  We will have to aggressively manage his nausea. I have encouraged oral supplements such as boost/ensure.  I advised him that mucinex can help thin thick secretions as long as his fluid intake is good. We also discussed a cool mist vaporizer. He was given a prescription for liquid lortab if needed. He is going to be very conservative on pain medication use. Prior addiction history has been discussed; however pain control is also going to be important and will work closing on this.  He has developed tinnitis. I am concerned about this so early in his treatment course. We will have to closely monitor, I again reviewed the potential ototoxicity of cisplatin.

## 2015-08-28 NOTE — Progress Notes (Signed)
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 He is seen at [facility], Hepatitis C negative CT neck with contrast 06/25/2015 at [facility] level 2 malignant LAD, largest node is 3.5 cm long axis. R worse than L carotid atherosclerosis and stenosis. CT chest with no acute or significant findings within the chest   HISTORY OF PRESENTING ILLNESS:  Frank Castillo 65 y.o. male is here for further follow-up of Stage IVA T2N2bM0 squamous cell Ca of the L tonsil. Left tonsillectomy performed on 07/09/2015 with Dr. Benjamine Mola.  Mr. Behney is here alone today.  He had a lot of nausea this weekend especially Friday afternoon and all day Saturday. He said that for hours and hours he felt like he wanted to throw up. His nausea medication helped he said "I mean I didn't throw up" But Sunday and Monday he felt great. He also said that yesterday he had high energy. His weight is down several pounds.  He has started to have constant ringing in his ears. Over the weekend it was coming and going but now it is "pretty constant" He said that last night it was "pretty high" and thought that it would go away during the night but it is still there this morning. He thought that this could be because of his radiation.  He is still eating regularly. Yesterday morning he made bacon, pancakes and eggs and this morning he had some yogurt and juice on the run because he had to come here. He said his eating really depends on the day and that this is "breaking my routine"  He has had some thick secretions recently. He went to Dr. Isidore Moos and she gave him a prescription for a swish and swallow. He said  that he has a little bit of a sore throat also. He notes that Dr. Enrique Sack taught him about soda and salt rinses.   He has had no diarrhea, but says that he might be a little constipated except when he gets fluids. He was given an info sheet about how to manage this.  He is taking his heartburn medication every day since Monday. He has not seen any improvement in this yet. He said that he doesn't feel it all the time but he feels like he is bloated.  His pain is okay and he does not currently take anything for pain.  He stated that he is sleeping.  His fingers and toes are okay.  He has a radiation appointment this afternoon at 3:30. He is getting fluids today.  MEDICAL HISTORY:  Past Medical History  Diagnosis Date  . Tonsillar mass     left  . Hypertension   . Cirrhosis ([place])   . Portal hypertension ([place])     SURGICAL HISTORY: Past Surgical History  Procedure Laterality Date  . Hernia repair Right   . Mouth surgery    . Tonsillectomy Left 07/09/2015    Procedure: LEFT TONSILLECTOMY;  Surgeon: Leta Baptist, MD;  Location: [facility];  Service: ENT;  Laterality: Left;    SOCIAL HISTORY: Social History   Social History  . Marital Status: Single    Spouse Name: N/A  . Number of Children: 0  . Years of Education: N/A  Occupational History  . Not on file.   Social History Main Topics  . Smoking status: Current Every Day Smoker -- 0.50 packs/day    Types: Cigarettes  . Smokeless tobacco: Never Used  . Alcohol Use: No     Comment: former abuse- quit 09/2011  . Drug Use: No  . Sexual Activity: Not on file   Other Topics Concern  . Not on file   Social History Narrative   Divorced.   No children.   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   Currently smoking approximately one pack per day.   Has been smoking off/on since age 59.   Divorced No children. Just step-children. 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 Previously worked in [employer]. Originally from [place], [place]. Current smoker at 1 ppd. Began smoking at 53-15 yo, regularly smoking at 89-21 yo. During his "cocaine years" he smoked about 2 ppd. ETOH, recovering alcoholic. 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 He enjoys reading. 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  FAMILY HISTORY: Family History  Problem Relation Age of Onset  . Macular degeneration Mother   . Alcoholism Father    indicated that his mother is alive. He indicated that his father is deceased.   Mother still living at 61 years-old, with macular degeneration. She has no other health issues. Father deceased. Parents divorced at 29 years-old, so he is not sure what he died from. Father was an alcoholic. 4 sisters. 1 sister died of suicide. 1 sister was adopted. 1 Sister lives in [place]. 1 Sister lives in [place].  ALLERGIES:  is allergic to bee venom.  MEDICATIONS:  Current Outpatient Prescriptions  Medication Sig Dispense Refill  . cetirizine (ZYRTEC) 10 MG tablet Take 10 mg by mouth daily.    . cholecalciferol (VITAMIN D) 1000 units tablet Take 1,000 Units by mouth daily.    Marland Kitchen CISPLATIN IV Inject into the vein every 21 ( twenty-one) days.    Marland Kitchen lidocaine-prilocaine (EMLA) cream Apply a quarter size amount to port site 1 hour prior to chemo. Do not rub in. Cover with plastic wrap. 30 g 3  . lisinopril-hydrochlorothiazide (PRINZIDE,ZESTORETIC) 20-25 MG tablet Take 1 tablet by mouth daily.  11  . Melatonin 10 MG TABS Take by mouth.    . nicotine (NICODERM CQ) 14 mg/24hr patch apply 21 mg patch daily x6wk, then 14 mg patch daily x2wk, then 7 mg patch daily x2wk 14 patch 0  . nicotine (NICODERM CQ) 21 mg/24hr patch apply 21 mg patch daily x6wk, then 14 mg patch daily x2wk, then 7 mg patch daily x2wk 21 patch 1  . nicotine (NICODERM CQ) 7 mg/24hr patch apply 21 mg patch daily x6wk, then 14 mg patch daily x2wk, then 7 mg patch daily x2wk 14 patch 0  . Nutritional Supplements (JEVITY 1.2  CAL/FIBER) LIQD 8 cans/day.  4 feeding of 2 cans each. Flush with 100 mls before and after each feeding 1000 mL 6  . ondansetron (ZOFRAN ODT) 8 MG disintegrating tablet Take 1 tablet (8 mg total) by mouth every 8 (eight) hours as needed for nausea or vomiting. 30 tablet 2  . oxyCODONE (ROXICODONE) 5 MG/5ML solution Take 5-10 mLs (5-10 mg total) by mouth every 4 (four) hours as needed for severe pain. 250 mL 0  . oxyCODONE-acetaminophen (ROXICET) 5-325 MG tablet Take 1-2 tablets by mouth every 4 (four) hours as needed for severe pain. 30 tablet 0  . pantoprazole (PROTONIX) 40 MG tablet Take  1 tablet (40 mg total) by mouth daily. 30 tablet 3  . Pegfilgrastim (NEULASTA ONPRO [place]) Inject into the skin. Every 21 days    . prochlorperazine (COMPAZINE) 10 MG tablet Take 1 tablet (10 mg total) by mouth every 6 (six) hours as needed for nausea or vomiting. 30 tablet 2  . sodium fluoride (FLUORISHIELD) 1.1 % GEL dental gel Instill one drop of gel per tooth space of fluoride tray. Place over teeth for 5 minutes. Remove. Spit out excess. Repeat nightly. 120 mL prn   No current facility-administered medications for this visit.   Facility-Administered Medications Ordered in Other Visits  Medication Dose Route Frequency Provider Last Rate Last Dose  . 0.9 %  sodium chloride infusion  500 mL/hr Intravenous Continuous Baird Cancer, PA-C 500 mL/hr at 08/28/15 0915 500 mL/hr at 08/28/15 0915  . heparin lock flush 100 unit/mL  500 Units Intravenous Once Thomas S Kefalas, PA-C      . palonosetron (ALOXI) injection 0.25 mg  0.25 mg Intravenous Once Patrici Ranks, MD      . sodium chloride flush (NS) 0.9 % injection 10 mL  10 mL Intravenous Once Baird Cancer, PA-C        Review of Systems  Constitutional: Positive for bloating. Negative for fever, chills and malaise/fatigue.  HENT: Positive for tinnitus and sore throat. Negative for congestion, hearing loss and nosebleeds.        L neck mass . Tinnitus was  coming and going but is now constant.  Eyes: Negative.  Negative for blurred vision, double vision, pain and discharge.  Respiratory: Positive for sputum production.  Negative for cough, hemoptysis, shortness of breath and wheezing.   Has thick secretions. Was given a prescription for a swish and swallow.  Cardiovascular: Negative.  Negative for chest pain, palpitations, claudication, leg swelling and PND.  Gastrointestinal: Positive for nausea and constipation.  Negative for heartburn, vomiting, abdominal pain, diarrhea, blood in stool and melena.  Nausea on Friday afternoon and Saturday. Nausea medication helped.  Genitourinary: Negative.  Negative for dysuria, urgency, frequency and hematuria.  Musculoskeletal: Negative.  Negative for myalgias, joint pain and falls.  Skin: Negative.  Negative for itching and rash.  Neurological: Negative.  Negative for dizziness, tingling, tremors, sensory change, speech change, focal weakness, seizures, loss of consciousness, weakness and headaches.  Endo/Heme/Allergies: Negative.  Does not bruise/bleed easily.  Psychiatric/Behavioral: Negative.  Negative for depression, suicidal ideas, memory loss and substance abuse. The patient is not nervous/anxious and does not have insomnia.   All other systems reviewed and are negative.  14 point ROS was done and is otherwise as detailed above or in HPI   PHYSICAL EXAMINATION: ECOG PERFORMANCE STATUS: 0 - Asymptomatic Vitals with BMI 08/28/2015  Height   Weight 175 lbs 13 oz  BMI   Systolic 962  Diastolic 96  Pulse 81  Respirations 18    Physical Exam  Constitutional: He is oriented to person, place, and time and well-developed, well-nourished, and in no distress.  HENT:  Head: Normocephalic and atraumatic.  Nose: Nose normal.  Mouth/Throat: Oropharynx is clear and moist. No oropharyngeal exudate. Mild posterior oropharyngeal erythema. Eyes: Conjunctivae and EOM are normal. Pupils are equal, round, and  reactive to light. Right eye exhibits no discharge. Left eye exhibits no discharge. No scleral icterus.  Neck: Normal range of motion. Neck supple. No tracheal deviation present. No thyromegaly present.  Two palpable lymph nodes, level 2. Mild XRT changes Cardiovascular: Normal rate, regular rhythm  and normal heart sounds.  Exam reveals no gallop and no friction rub.  Port site is C/D/I no erythema or bruising No murmur heard. Pulmonary/Chest: Effort normal and breath sounds normal. He has no wheezes. He has no rales.  Abdominal: Soft. Bowel sounds are normal. He exhibits no distension and no mass. There is no tenderness. There is no rebound and no guarding. FT site is dressed Musculoskeletal: Normal range of motion. He exhibits no edema.  Neurological: He is alert and oriented to person, place, and time. He has normal reflexes. No cranial nerve deficit. Gait normal. Coordination normal.  Skin: Skin is warm and dry. No rash noted.  Psychiatric: Mood, memory, affect and judgment normal.  Nursing note and vitals reviewed.   LABORATORY DATA:  I have reviewed the data as listed Results for MARQUES, ERICSON (MRN [id number]) as of 08/28/2015 14:11  Ref. Range 08/28/2015 09:15  Sodium Latest Ref Range: 135-145 mmol/L 135  Potassium Latest Ref Range: 3.5-5.1 mmol/L 4.0  Chloride Latest Ref Range: 101-111 mmol/L 100 (L)  CO2 Latest Ref Range: 22-32 mmol/L 26  BUN Latest Ref Range: 6-20 mg/dL 19  Creatinine Latest Ref Range: 0.61-1.24 mg/dL 0.98  Calcium Latest Ref Range: 8.9-10.3 mg/dL 8.9  EGFR (Non-African Amer.) Latest Ref Range: >60 mL/min >60  EGFR (African American) Latest Ref Range: >60 mL/min >60  Glucose Latest Ref Range: 65-99 mg/dL 99  Anion gap Latest Ref Range: 5-15  9  Alkaline Phosphatase Latest Ref Range: 38-126 U/L 140 (H)  Albumin Latest Ref Range: 3.5-5.0 g/dL 4.1  AST Latest Ref Range: 15-41 U/L 15  ALT Latest Ref Range: 17-63 U/L 20  Total Protein Latest Ref Range: 6.5-8.1  g/dL 7.1  Total Bilirubin Latest Ref Range: 0.3-1.2 mg/dL 0.3     RADIOGRAPHIC STUDIES: I have personally reviewed the radiological reports as listed. Study Result     INDICATION: 65 year old with squamous cell carcinoma of the left tonsil. Port-A-Cath was placed immediately prior to placement of the gastrostomy tube.  EXAM: PERCUTANEOUS GASTROSTOMY TUBE WITH FLUOROSCOPIC GUIDANCE  Physician: Stephan Minister. Henn, MD  MEDICATIONS: Ancef 2 g was given prior to placement of the Port-A-Cath. Glucagon 1 mg IV  ANESTHESIA/SEDATION: Versed 2.0 mg IV; Fentanyl 25 mcg IV  Moderate Sedation Time: 11 minutes  The patient was continuously monitored during the procedure by the interventional radiology nurse under my direct supervision.  FLUOROSCOPY TIME: Fluoroscopy Time: 1 minutes, 24 seconds. 22 mGy  CONTRAST: 5 mL Omnipaque [id number]  COMPLICATIONS: None immediate.  PROCEDURE: Informed consent was obtained for a percutaneous gastrostomy tube. The patient was placed on the interventional table. Fluoroscopy demonstrated oral contrast in the transverse colon. An orogastric tube was placed with fluoroscopic guidance. The anterior abdomen was prepped and draped in sterile fashion. Maximal barrier sterile technique was utilized including caps, mask, sterile gowns, sterile gloves, sterile drape, hand hygiene and skin antiseptic. Stomach was inflated with air through the orogastric tube. The skin and subcutaneous tissues were anesthetized with 1% lidocaine. A 17 gauge needle was directed into the distended stomach with fluoroscopic guidance. A wire was advanced into the stomach and a T-tact was deployed. A 9-French vascular sheath was placed and the orogastric tube was snared using a Goos[place]ck snare device. The orogastric tube and snare were pulled out of the patient's mouth. The snare device was connected to a 20-French gastrostomy tube. The snare device and gastrostomy tube  were pulled through the patient's mouth and out the anterior abdominal wall. The gastrostomy tube was cut  to an appropriate length. Contrast injection through gastrostomy tube confirmed placement within the stomach. Fluoroscopic images were obtained for documentation. The gastrostomy tube was flushed with normal saline.  IMPRESSION: Successful fluoroscopic guided percutaneous gastrostomy tube placement.   Electronically Signed  By: Markus Daft M.D.  On: 08/20/2015 16:39    Study Result     INDICATION: 65 year old with squamous cell carcinoma of the left tonsil. Patient scheduled for Port-A-Cath placement and gastrostomy tube placement. Plan for Port-A-Cath placement prior to gastrostomy tube.  EXAM: FLUOROSCOPIC AND ULTRASOUND GUIDED PLACEMENT OF A SUBCUTANEOUS PORT  COMPARISON: None.  MEDICATIONS: Ancef 2 g; The antibiotic was administered within an appropriate time interval prior to skin puncture.  ANESTHESIA/SEDATION: Versed 4.0 mg IV; Fentanyl 100 mcg IV;  Moderate Sedation Time: 25 minutes  The patient was continuously monitored during the procedure by the interventional radiology nurse under my direct supervision.  FLUOROSCOPY TIME: 12 seconds, 3 mGy  COMPLICATIONS: None immediate.  PROCEDURE: The procedure, risks, benefits, and alternatives were explained to the patient. Questions regarding the procedure were encouraged and answered. The patient understands and consents to the procedure.  Patient was placed supine on the interventional table. Ultrasound confirmed a patent right internal jugular vein. The right chest and neck were cleaned with a skin antiseptic and a sterile drape was placed. Maximal barrier sterile technique was utilized including caps, mask, sterile gowns, sterile gloves, sterile drape, hand hygiene and skin antiseptic. The right neck was anesthetized with 1% lidocaine. Small incision was made in the right neck with a  blade. Micropuncture set was placed in the right internal jugular vein with ultrasound guidance. The micropuncture wire was used for measurement purposes. The right chest was anesthetized with 1% lidocaine with epinephrine. #15 blade was used to make an incision and a subcutaneous port pocket was formed. [place] was assembled. Subcutaneous tunnel was formed with a stiff tunneling device. The port catheter was brought through the subcutaneous tunnel. The port was placed in the subcutaneous pocket. The micropuncture set was exchanged for a peel-away sheath. The catheter was placed through the peel-away sheath and the tip was positioned at the superior cavoatrial junction. Catheter placement was confirmed with fluoroscopy. The port was accessed and flushed with heparinized saline. The port pocket was closed using two layers of absorbable sutures and Dermabond. The vein skin site was closed using a single layer of absorbable suture and Dermabond. Sterile dressings were applied. Patient tolerated the procedure well without an immediate complication. Ultrasound and fluoroscopic images were taken and saved for this procedure.  IMPRESSION: Placement of a subcutaneous port device. Catheter tip at the superior cavoatrial junction.   Electronically Signed  By: Markus Daft M.D.  On: 08/20/2015 16:37     Study Result     INDICATION: 65 year old with squamous cell carcinoma of the left tonsil. Patient scheduled for Port-A-Cath placement and gastrostomy tube placement. Plan for Port-A-Cath placement prior to gastrostomy tube.  EXAM: FLUOROSCOPIC AND ULTRASOUND GUIDED PLACEMENT OF A SUBCUTANEOUS PORT  COMPARISON: None.  MEDICATIONS: Ancef 2 g; The antibiotic was administered within an appropriate time interval prior to skin puncture.  ANESTHESIA/SEDATION: Versed 4.0 mg IV; Fentanyl 100 mcg IV;  Moderate Sedation Time: 25 minutes  The patient was  continuously monitored during the procedure by the interventional radiology nurse under my direct supervision.  FLUOROSCOPY TIME: 12 seconds, 3 mGy  COMPLICATIONS: None immediate.  PROCEDURE: The procedure, risks, benefits, and alternatives were explained to the patient. Questions regarding the procedure were encouraged  and answered. The patient understands and consents to the procedure.  Patient was placed supine on the interventional table. Ultrasound confirmed a patent right internal jugular vein. The right chest and neck were cleaned with a skin antiseptic and a sterile drape was placed. Maximal barrier sterile technique was utilized including caps, mask, sterile gowns, sterile gloves, sterile drape, hand hygiene and skin antiseptic. The right neck was anesthetized with 1% lidocaine. Small incision was made in the right neck with a blade. Micropuncture set was placed in the right internal jugular vein with ultrasound guidance. The micropuncture wire was used for measurement purposes. The right chest was anesthetized with 1% lidocaine with epinephrine. #15 blade was used to make an incision and a subcutaneous port pocket was formed. [place] was assembled. Subcutaneous tunnel was formed with a stiff tunneling device. The port catheter was brought through the subcutaneous tunnel. The port was placed in the subcutaneous pocket. The micropuncture set was exchanged for a peel-away sheath. The catheter was placed through the peel-away sheath and the tip was positioned at the superior cavoatrial junction. Catheter placement was confirmed with fluoroscopy. The port was accessed and flushed with heparinized saline. The port pocket was closed using two layers of absorbable sutures and Dermabond. The vein skin site was closed using a single layer of absorbable suture and Dermabond. Sterile dressings were applied. Patient tolerated the procedure well without an immediate  complication. Ultrasound and fluoroscopic images were taken and saved for this procedure.  IMPRESSION: Placement of a subcutaneous port device. Catheter tip at the superior cavoatrial junction.   Electronically Signed  By: Markus Daft M.D.  On: 08/20/2015 16:37     Study Result     CLINICAL DATA: Initial treatment strategy for primary tonsillar squamous cell carcinoma. Left-sided lung tonsillectomy.  EXAM: NUCLEAR MEDICINE PET SKULL BASE TO THIGH  TECHNIQUE: 8.8 mCi F-18 FDG was injected intravenously. Full-ring PET imaging was performed from the skull base to thigh after the radiotracer. CT data was obtained and used for attenuation correction and anatomic localization.  FASTING BLOOD GLUCOSE: Value: 116 mg/dl  COMPARISON: Clinic note of 07/24/2015. Neck and chest CTs of 06/25/2015.  FINDINGS: NECK  No tonsilar hypermetabolism.  Hypermetabolic left-sided level 2 nodes. Index node measures 2.1 cm and a S.U.V. max of 17.9 on image 25/series 4. 1.9 cm on 06/25/2015.  Immediately inferior node measures 9 mm and a S.U.V. max of 3.8 on image 30/series 4.  CHEST  No areas of abnormal hypermetabolism.  ABDOMEN/PELVIS  No areas of abnormal hypermetabolism.  SKELETON  No abnormal marrow activity.  CT IMAGES PERFORMED FOR ATTENUATION CORRECTION  carotid atherosclerosis. Mild cardiomegaly with multivessel coronary artery atherosclerosis. Mild centrilobular emphysema. Mild bibasilar atelectasis. Normal adrenal glands. Gallstones and a contracted gallbladder. Moderate cirrhosis. Advanced abdominal aortic atherosclerosis. Scattered colonic diverticula. Mild prostatomegaly. Fat containing left inguinal hernia. Prior right inguinal hernia repair.  IMPRESSION: 1. Hypermetabolic left-sided level 2 adenopathy, consistent with nodal metastasis. 2. No extracervical metastatic disease identified. 3. Cirrhosis. 4. Atherosclerosis, including  within the coronary arteries. 5. Cholelithiasis.   Electronically Signed  By: Abigail Miyamoto M.D.  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 [facility], Hepatitis C negative CT neck with contrast 06/25/2015 at [place] level 2 malignant LAD, largest node is 3.5 cm long axis. R worse than L carotid atherosclerosis and stenosis. CT  chest with no acute or significant findings within the chest Stage IVA T2N2bM0  Cancer of tonsillar fossa ([place]) His weight is down a few pounds. I have given him several anti-emetics with his IVF today.  We will have to aggressively manage his nausea. I have encouraged oral supplements such as boost/ensure.  I advised him that mucinex can help thin thick secretions as long as his fluid intake is good. We also discussed a cool mist vaporizer. He was given a prescription for liquid lortab if needed. He is going to be very conservative on pain medication use. Prior addiction history has been discussed; however pain control is also going to be important and will work closing on this.  He has developed tinnitis. I am concerned about this so early in his treatment course. We will have to closely monitor, I again reviewed the potential ototoxicity of cisplatin.  He will return next Thursday for a follow up appointment.   All questions were answered. The patient knows to call the clinic with any problems, questions or concerns.  This document serves as a record of services personally performed by Ancil Linsey, MD. It was created on her behalf by Kandace Blitz, a trained medical scribe. The creation of this record is based on the scribe's personal observations and the provider's statements to them. This document has been checked and approved by the attending provider.  I have reviewed the above  documentation for accuracy and completeness, and I agree with the above.  This note was electronically signed.    Molli Hazard, MD  08/28/2015 10:32 AM

## 2015-08-28 NOTE — Progress Notes (Signed)
Patient tolerated infusion well.  VSS.  Patient didn't want the ordered Ativan because he has had it before and it made him very drowsy and he has to work and go to radiation today and doesn't like the feeling as well.  All other antiemetics given as ordered and the patient stated that he no longer felt any nausea when he left the clinic today.

## 2015-08-28 NOTE — Patient Instructions (Signed)
[place] at [facility] Discharge Instructions  RECOMMENDATIONS MADE BY THE CONSULTANT AND ANY TEST RESULTS WILL BE SENT TO YOUR REFERRING PHYSICIAN.  IV fluids today.    Thank you for choosing [place] at [facility] to provide your oncology and hematology care.  To afford each patient quality time with our provider, please arrive at least 15 minutes before your scheduled appointment time.   Beginning January 23rd 2017 lab work for the [facility] will be done in the  Main lab at [facility] on 1st floor. If you have a lab appointment with the [place] please come in thru the  Main Entrance and check in at the main information desk  You need to re-schedule your appointment should you arrive 10 or more minutes late.  We strive to give you quality time with our providers, and arriving late affects you and other patients whose appointments are after yours.  Also, if you no show three or more times for appointments you may be dismissed from the clinic at the providers discretion.     Again, thank you for choosing [facility].  Our hope is that these requests will decrease the amount of time that you wait before being seen by our physicians.       _____________________________________________________________  Should you have questions after your visit to [facility], please contact our office at [phone] between the hours of 8:30 a.m. and 4:30 p.m.  Voicemails left after 4:30 p.m. will not be returned until the following business day.  For prescription refill requests, have your pharmacy contact our office.         Resources For Cancer Patients and their Caregivers ? American Cancer Society: Can assist with transportation, wigs, general needs, runs Look Good Feel Better.        816-592-2019 ? Cancer Care: Provides financial assistance, online support groups, medication/co-pay assistance.   1-800-813-HOPE 346-184-0776) ? [place] Assists [place] cancer patients and their families through emotional , educational and financial support.  [phone] ? [place] DSS Where to apply for food stamps, Medicaid and utility assistance. 812-024-5803 ? RCATS: Transportation to medical appointments. 480-696-3987 ? Social Security Administration: May apply for disability if have a Stage IV cancer. [phone] ? [employer], Disability and Transit Services: Assists with nutrition, care and transit needs. [phone]

## 2015-08-28 NOTE — Patient Instructions (Addendum)
[place] at [facility] Discharge Instructions  RECOMMENDATIONS MADE BY THE CONSULTANT AND ANY TEST RESULTS WILL BE SENT TO YOUR REFERRING PHYSICIAN.   Exam and discussion by Dr Whitney Muse today You can add mucinex to help thin the mucous  Let us know if the ringing in your ears get worse Dont let you bowels get constipated Fluids and nausea medication today Pain medication prescription  Return to see the doctor as scheduled Please call the clinic if you have any questions or concerns     Constipation sheet  It is common for patients who are undergoing treatment and taking certain prescribed medications to experience side-effects with constipation.  If you experience constipation, please take stool softener such as Colace or Senna one tablet twice a day everyday to avoid constipation. You can take up to four senna twice daily if needed.  These medications are available over the counter.  Of course, if you have diarrhea, stop taking stool softeners.  Drinking plenty of fluid, eating fruits and vegetable, and being active also reduces the risk of constipation.   If despite taking stool softeners, and you still have no bowel movement for 2 days or more than your normal bowel habit frequency, please take one of the following over the counter laxatives:  MiraLax, Milk of Magnesia or Mag Citrate everyday and contact me immediately for further instructions.  The goal is to have at least one bowel movement every other day.   Sometimes patients need a combination of miralax and senna to go daily.  Please call if this does not work for you.  It is very important to NOT become constipated.  It will make you feel sick to your stomach (nausea) and can cause abdominal pain and vomiting.      Thank you for choosing [place] at [facility] to provide your oncology and hematology care.  To afford each patient quality time with our provider, please  arrive at least 15 minutes before your scheduled appointment time.   Beginning January 23rd 2017 lab work for the [facility] will be done in the  Main lab at [facility] on 1st floor. If you have a lab appointment with the [place] please come in thru the  Main Entrance and check in at the main information desk  You need to re-schedule your appointment should you arrive 10 or more minutes late.  We strive to give you quality time with our providers, and arriving late affects you and other patients whose appointments are after yours.  Also, if you no show three or more times for appointments you may be dismissed from the clinic at the providers discretion.     Again, thank you for choosing [facility].  Our hope is that these requests will decrease the amount of time that you wait before being seen by our physicians.       _____________________________________________________________  Should you have questions after your visit to [facility], please contact our office at [phone] between the hours of 8:30 a.m. and 4:30 p.m.  Voicemails left after 4:30 p.m. will not be returned until the following business day.  For prescription refill requests, have your pharmacy contact our office.         Resources For Cancer Patients and their Caregivers ? American Cancer Society: Can assist with transportation, wigs, general needs, runs Look Good Feel Better.        ([phone] ?  Cancer Care: Provides financial assistance, online support groups, medication/co-pay assistance.  1-800-813-HOPE 256-461-2217) ? [place] Assists [place] cancer patients and their families through emotional , educational and financial support.  703-215-0094 ? [place] DSS Where to apply for food stamps, Medicaid and utility assistance. ([phone] ? RCATS: Transportation to medical appointments. 680-421-5764 ? Social Security Administration: May  apply for disability if have a Stage IV cancer. 6107840888 [phone] ? [employer], Disability and Transit Services: Assists with nutrition, care and transit needs. [phone]

## 2015-08-30 ENCOUNTER — Encounter (HOSPITAL_BASED_OUTPATIENT_CLINIC_OR_DEPARTMENT_OTHER): Payer: BLUE CROSS/BLUE SHIELD

## 2015-08-30 VITALS — BP 136/72 | HR 80 | Temp 98.1°F | Resp 18

## 2015-08-30 DIAGNOSIS — R634 Abnormal weight loss: Secondary | ICD-10-CM | POA: Diagnosis not present

## 2015-08-30 DIAGNOSIS — C099 Malignant neoplasm of tonsil, unspecified: Secondary | ICD-10-CM

## 2015-08-30 DIAGNOSIS — R110 Nausea: Secondary | ICD-10-CM | POA: Diagnosis not present

## 2015-08-30 MED ORDER — HEPARIN SOD (PORK) LOCK FLUSH 100 UNIT/ML IV SOLN
INTRAVENOUS | Status: AC
  Filled 2015-08-30: qty 5

## 2015-08-30 MED ORDER — SODIUM CHLORIDE 0.9 % IV SOLN
INTRAVENOUS | Status: DC
  Administered 2015-08-30: 10:00:00 via INTRAVENOUS

## 2015-08-30 MED ORDER — PALONOSETRON HCL INJECTION 0.25 MG/5ML
0.2500 mg | Freq: Once | INTRAVENOUS | Status: AC
  Administered 2015-08-30: 0.25 mg via INTRAVENOUS

## 2015-08-30 MED ORDER — SODIUM CHLORIDE 0.9 % IV SOLN
4.0000 mg | Freq: Once | INTRAVENOUS | Status: AC
  Administered 2015-08-30: 4 mg via INTRAVENOUS
  Filled 2015-08-30: qty 0.4

## 2015-08-30 MED ORDER — HEPARIN SOD (PORK) LOCK FLUSH 100 UNIT/ML IV SOLN
500.0000 [IU] | Freq: Once | INTRAVENOUS | Status: AC
  Administered 2015-08-30: 500 [IU] via INTRAVENOUS

## 2015-08-30 MED ORDER — PALONOSETRON HCL INJECTION 0.25 MG/5ML
INTRAVENOUS | Status: AC
  Filled 2015-08-30: qty 5

## 2015-08-30 NOTE — Patient Instructions (Signed)
[facility] Discharge Instructions for Patients Receiving Chemotherapy   Beginning January 23rd 2017 lab work for the [facility] will be done in the  Main lab at [facility] on 1st floor. If you have a lab appointment with the [place] please come in thru the  Main Entrance and check in at the main information desk   IV fluids and anti-nausea medications.     If you develop nausea and vomiting, or diarrhea that is not controlled by your medication, call the clinic.  The clinic phone number is [phone]. Office hours are Monday-Friday 8:30am-5:00pm.  BELOW ARE SYMPTOMS THAT SHOULD BE REPORTED IMMEDIATELY:  *FEVER GREATER THAN 101.0 F  *CHILLS WITH OR WITHOUT FEVER  NAUSEA AND VOMITING THAT IS NOT CONTROLLED WITH YOUR NAUSEA MEDICATION  *UNUSUAL SHORTNESS OF BREATH  *UNUSUAL BRUISING OR BLEEDING  TENDERNESS IN MOUTH AND THROAT WITH OR WITHOUT PRESENCE OF ULCERS  *URINARY PROBLEMS  *BOWEL PROBLEMS  UNUSUAL RASH Items with * indicate a potential emergency and should be followed up as soon as possible. If you have an emergency after office hours please contact your primary care physician or go to the nearest emergency department.  Please call the clinic during office hours if you have any questions or concerns.   You may also contact the Patient Navigator at [phone] should you have any questions or need assistance in obtaining follow up care.      Resources For Cancer Patients and their Caregivers ? American Cancer Society: Can assist with transportation, wigs, general needs, runs Look Good Feel Better.        [phone] ? Cancer Care: Provides financial assistance, online support groups, medication/co-pay assistance.  1-800-813-HOPE 747-764-2066) ? [place] Assists [place] cancer patients and their families through emotional , educational and financial support.  [phone] ? [place] DSS Where to  apply for food stamps, Medicaid and utility assistance. ([phone] ? RCATS: Transportation to medical appointments. 530-108-3557 ? Social Security Administration: May apply for disability if have a Stage IV cancer. [phone] 715-502-4507 ? [employer], Disability and Transit Services: Assists with nutrition, care and transit needs. [phone]

## 2015-08-30 NOTE — Progress Notes (Signed)
Patient tolerated infusion well.  VSS.   

## 2015-09-02 ENCOUNTER — Encounter (HOSPITAL_BASED_OUTPATIENT_CLINIC_OR_DEPARTMENT_OTHER): Payer: BLUE CROSS/BLUE SHIELD

## 2015-09-02 VITALS — BP 139/92 | HR 88 | Temp 98.0°F | Resp 18

## 2015-09-02 DIAGNOSIS — C099 Malignant neoplasm of tonsil, unspecified: Secondary | ICD-10-CM

## 2015-09-02 DIAGNOSIS — R110 Nausea: Secondary | ICD-10-CM

## 2015-09-02 LAB — COMPREHENSIVE METABOLIC PANEL
ALBUMIN: 4.3 g/dL (ref 3.5–5.0)
ALT: 16 U/L — AB (ref 17–63)
AST: 14 U/L — AB (ref 15–41)
Alkaline Phosphatase: 95 U/L (ref 38–126)
Anion gap: 9 (ref 5–15)
BUN: 18 mg/dL (ref 6–20)
CHLORIDE: 97 mmol/L — AB (ref 101–111)
CO2: 26 mmol/L (ref 22–32)
CREATININE: 1.04 mg/dL (ref 0.61–1.24)
Calcium: 8.9 mg/dL (ref 8.9–10.3)
GFR calc Af Amer: >60 mL/min (ref >60)
GFR calc non Af Amer: >60 mL/min (ref >60)
Glucose, Bld: 94 mg/dL (ref 65–99)
POTASSIUM: 4.3 mmol/L (ref 3.5–5.1)
SODIUM: 132 mmol/L — AB (ref 135–145)
Total Bilirubin: 0.8 mg/dL (ref 0.3–1.2)
Total Protein: 7.7 g/dL (ref 6.5–8.1)

## 2015-09-02 LAB — MAGNESIUM: MAGNESIUM: 2 mg/dL (ref 1.7–2.4)

## 2015-09-02 MED ORDER — HEPARIN SOD (PORK) LOCK FLUSH 100 UNIT/ML IV SOLN
500.0000 [IU] | Freq: Once | INTRAVENOUS | Status: AC
  Administered 2015-09-02: 500 [IU] via INTRAVENOUS

## 2015-09-02 MED ORDER — SODIUM CHLORIDE 0.9 % IV SOLN
Freq: Once | INTRAVENOUS | Status: AC
  Administered 2015-09-02: 10:00:00 via INTRAVENOUS

## 2015-09-02 NOTE — Patient Instructions (Signed)
[place] at [facility] Discharge Instructions  RECOMMENDATIONS MADE BY THE CONSULTANT AND ANY TEST RESULTS WILL BE SENT TO YOUR REFERRING PHYSICIAN.  IV fluids today.    Thank you for choosing [place] at [facility] to provide your oncology and hematology care.  To afford each patient quality time with our provider, please arrive at least 15 minutes before your scheduled appointment time.   Beginning January 23rd 2017 lab work for the [facility] will be done in the  Main lab at [facility] on 1st floor. If you have a lab appointment with the [place] please come in thru the  Main Entrance and check in at the main information desk  You need to re-schedule your appointment should you arrive 10 or more minutes late.  We strive to give you quality time with our providers, and arriving late affects you and other patients whose appointments are after yours.  Also, if you no show three or more times for appointments you may be dismissed from the clinic at the providers discretion.     Again, thank you for choosing [facility].  Our hope is that these requests will decrease the amount of time that you wait before being seen by our physicians.       _____________________________________________________________  Should you have questions after your visit to [facility], please contact our office at [phone] between the hours of 8:30 a.m. and 4:30 p.m.  Voicemails left after 4:30 p.m. will not be returned until the following business day.  For prescription refill requests, have your pharmacy contact our office.         Resources For Cancer Patients and their Caregivers ? American Cancer Society: Can assist with transportation, wigs, general needs, runs Look Good Feel Better.        ([phone] ? Cancer Care: Provides financial assistance, online support groups, medication/co-pay assistance.   1-800-813-HOPE (214)266-2685) ? [place] Assists [place] cancer patients and their families through emotional , educational and financial support.  940-567-6025 ? [place] DSS Where to apply for food stamps, Medicaid and utility assistance. 724-778-7798 ? RCATS: Transportation to medical appointments. 934-795-9915 ? Social Security Administration: May apply for disability if have a Stage IV cancer. [phone] ? [employer], Disability and Transit Services: Assists with nutrition, care and transit needs. [phone]

## 2015-09-02 NOTE — Progress Notes (Signed)
Patient reports that he has had some leg cramps since last night.  Robynn Pane, PA made aware and order for CMET was obtained.  Results printed and handed to PA with sodium and chloride levels low, but all other labs WNL.  No new orders obtained.  Patient tolerated infusion well. VSS.

## 2015-09-03 NOTE — Progress Notes (Signed)
No primary care provider on file. No primary provider on file.  Cancer of tonsillar fossa ([employer]) - Plan: DISCONTINUED: 0.9 %  sodium chloride infusion  Sputum production - Plan: guaiFENesin (ROBITUSSIN) 100 MG/5ML SOLN  Thrush - Plan: Diphenhyd-Hydrocort-Nystatin (FIRST-DUKES MOUTHWASH) SUSP  CURRENT THERAPY: Concomitant chemoradiation beginning on 08/20/2015 consisting of Cisplatin every 21 days in a curative fashion.  INTERVAL HISTORY: Frank Castillo 65 y.o. 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    Cancer of tonsillar fossa ([place])   08/02/2015 Initial Diagnosis Cancer of tonsillar fossa ([place])   08/20/2015 Procedure Port-a-cath placed by IR   08/20/2015 Procedure G-tube placed by IR   08/21/2015 -  Radiation Therapy    08/21/2015 -  Chemotherapy Cisplatin every 21 days with XRT.   NADIR CHECK TODAY.  He is here for normal saline infusion for hydration purposes to prevent complications associated with cisplatin therapy. We are doing this 3 times weekly.  I personally reviewed and went over laboratory results with the patient.  The results are noted within this dictation.   This first time I'm meeting the patient for examination. Difficult to ascertain specific history from the patient. He is nonspecific about a few things we talked about and provides very little detail particularly regarding investigative questioning.  He notes nausea without any emesis. He is not sure if it's "nausea or because I'm hungry."  His weight is down 2 pounds and therefore I message [name], RD, to update him.  He believes treatments going well thus far.  He notes some issues with constipation. He is previously been educated on the constipation she that we have available. He is currently using a stool softener. He notes a bowel movement yesterday,  but the stool was hard in nature. He is utilizing over-the-counter Senokot in taking to 3 tablets per day. He does not feel uncomfortable, constipation standpoint. I recommended the addition of MiraLAX 1/2- 1 full capful per day allowing him the liberty to adjust the dose according to his needs.  He notes a decrease in taste of foods and appetite.  He continues with "ringing of the ears" but reports that it is "less prevalent than when he saw Dr. Whitney Muse."  He is educated on ototoxicity secondary to cisplatin therapy. He knows that Dr. Whitney Muse has talked to him about possibly changing therapy in the future. I will update her on this patient's complaint.   Past Medical History  Diagnosis Date  . Tonsillar mass     left  . Hypertension   . Cirrhosis ([place])   . Portal hypertension (HCC)     has Squamous cell carcinoma of left tonsil (HCC) and Cancer of tonsillar fossa (HCC) on his problem list.     is allergic to bee venom.  Current Outpatient Prescriptions on File Prior to Visit  Medication Sig Dispense Refill  . cetirizine (ZYRTEC) 10 MG tablet Take 10 mg by mouth daily.    . cholecalciferol (VITAMIN D) 1000 units tablet Take 1,000 Units by mouth daily.    Marland Kitchen CISPLATIN IV Inject into the vein every 21 ( twenty-one) days.    Marland Kitchen lidocaine-prilocaine (EMLA) cream Apply a quarter size amount to port site 1 hour prior to chemo. Do not rub in. Cover with plastic wrap. 30 g 3  . lisinopril-hydrochlorothiazide (PRINZIDE,ZESTORETIC) 20-25 MG tablet Take 1 tablet by mouth daily.  11  . Melatonin 10 MG TABS Take by mouth.    . nicotine (NICODERM CQ) 14 mg/24hr patch apply 21 mg patch daily x6wk, then 14 mg patch daily x2wk, then 7 mg patch daily x2wk 14 patch 0  . nicotine (NICODERM CQ) 21 mg/24hr patch apply 21 mg patch daily x6wk, then 14 mg patch daily x2wk, then 7 mg patch daily x2wk 21 patch 1  . nicotine (NICODERM CQ) 7 mg/24hr patch apply 21 mg patch daily x6wk, then 14 mg patch daily x2wk, then  7 mg patch daily x2wk 14 patch 0  . Nutritional Supplements (JEVITY 1.2 CAL/FIBER) LIQD 8 cans/day.  4 feeding of 2 cans each. Flush with 100 mls before and after each feeding 1000 mL 6  . ondansetron (ZOFRAN ODT) 8 MG disintegrating tablet Take 1 tablet (8 mg total) by mouth every 8 (eight) hours as needed for nausea or vomiting. 30 tablet 2  . oxyCODONE (ROXICODONE) 5 MG/5ML solution Take 5-10 mLs (5-10 mg total) by mouth every 4 (four) hours as needed for severe pain. 250 mL 0  . oxyCODONE-acetaminophen (ROXICET) 5-325 MG tablet Take 1-2 tablets by mouth every 4 (four) hours as needed for severe pain. 30 tablet 0  . pantoprazole (PROTONIX) 40 MG tablet Take 1 tablet (40 mg total) by mouth daily. 30 tablet 3  . Pegfilgrastim (NEULASTA ONPRO [place]) Inject into the skin. Every 21 days    . prochlorperazine (COMPAZINE) 10 MG tablet Take 1 tablet (10 mg total) by mouth every 6 (six) hours as needed for nausea or vomiting. 30 tablet 2  . sodium fluoride (FLUORISHIELD) 1.1 % GEL dental gel Instill one drop of gel per tooth space of fluoride tray. Place over teeth for 5 minutes. Remove. Spit out excess. Repeat nightly. 120 mL prn   Current Facility-Administered Medications on File Prior to Visit  Medication Dose Route Frequency Provider Last Rate Last Dose  . 0.9 %  sodium chloride infusion   Intravenous Continuous Baird Cancer, PA-C 500 mL/hr at 09/05/15 [id number]    . heparin lock flush 100 unit/mL  500 Units Intravenous Once Baird Cancer, PA-C        Past Surgical History  Procedure Laterality Date  . Hernia repair Right   . Mouth surgery    . Tonsillectomy Left 07/09/2015    Procedure: LEFT TONSILLECTOMY;  Surgeon: Leta Baptist, MD;  Location: [facility];  Service: ENT;  Laterality: Left;    Denies any headaches, dizziness, double vision, fevers, chills, night sweats, , vomiting, diarrhea, , chest pain, heart palpitations, shortness of breath, blood in stool, black tarry stool,  urinary pain, urinary burning, urinary frequency, hematuria.   PHYSICAL EXAMINATION  ECOG PERFORMANCE STATUS: 1 - Symptomatic but completely ambulatory  Filed Vitals:   09/05/15 0837  BP: 127/90  Pulse: 85  Temp: 97.8 F (36.6 C)  Resp: 18    GENERAL:alert, no distress, well nourished, well developed, comfortable, cooperative, smiling and accompanied by his sister SKIN: skin color, texture, turgor are normal, no rashes or significant lesions HEAD: Normocephalic, No masses, lesions, tenderness or abnormalities EYES: normal, EOMI, Conjunctiva are pink and non-injected EARS: External ears normal OROPHARYNX:lips, buccal mucosa, and tongue normal, mucous membranes are moist and thrush noted with white plaques on buccal mucosa. NECK: supple, trachea midline LYMPH:  not examined BREAST:not examined LUNGS: clear to auscultation and percussion HEART: regular rate & rhythm, no murmurs, no gallops, S1 normal and S2 normal ABDOMEN:abdomen soft, non-tender and normal bowel  sounds BACK: Back symmetric, no curvature., No CVA tenderness EXTREMITIES:less then 2 second capillary refill, no joint deformities, effusion, or inflammation, no skin discoloration, no cyanosis  NEURO: alert & oriented x 3 with fluent speech, no focal motor/sensory deficits, gait normal    LABORATORY DATA: CBC    Component Value Date/Time   WBC 8.4 08/20/2015 1155   WBC 9.0 10/21/2011 1614   RBC 4.70 08/20/2015 1155   RBC 3.42* 10/21/2011 1614   HGB 13.1 08/20/2015 1155   HGB 11.5* 10/21/2011 1614   HCT 40.0 08/20/2015 1155   HCT 34.6* 10/21/2011 1614   PLT 253 08/20/2015 1155   PLT 209 10/21/2011 1614   MCV 85.1 08/20/2015 1155   MCV 101* 10/21/2011 1614   MCH 27.9 08/20/2015 1155   MCH 33.7 10/21/2011 1614   MCHC 32.8 08/20/2015 1155   MCHC 33.3 10/21/2011 1614   RDW 15.5 08/20/2015 1155   RDW 14.0 10/21/2011 1614   LYMPHSABS 2.8 08/20/2015 1155   LYMPHSABS 1.9 10/20/2011 0609   MONOABS 0.6 08/20/2015  1155   MONOABS 1.1* 10/20/2011 0609   EOSABS 0.2 08/20/2015 1155   EOSABS 0.1 10/20/2011 0609   BASOSABS 0.1 08/20/2015 1155   BASOSABS 0.1 10/20/2011 0609      Chemistry      Component Value Date/Time   NA 132* 09/02/2015 1007   NA 132* 10/21/2011 1614   K 4.3 09/02/2015 1007   K 3.6 10/21/2011 1614   CL 97* 09/02/2015 1007   CL 92* 10/21/2011 1614   CO2 26 09/02/2015 1007   CO2 32 10/21/2011 1614   BUN 18 09/02/2015 1007   BUN 7 10/21/2011 1614   CREATININE 1.04 09/02/2015 1007   CREATININE 0.73 10/21/2011 1614      Component Value Date/Time   CALCIUM 8.9 09/02/2015 1007   CALCIUM 7.9* 10/21/2011 1614   ALKPHOS 95 09/02/2015 1007   ALKPHOS 83 10/21/2011 1614   AST 14* 09/02/2015 1007   AST 97* 10/21/2011 1614   ALT 16* 09/02/2015 1007   ALT 34 10/21/2011 1614   BILITOT 0.8 09/02/2015 1007   BILITOT 1.1* 10/21/2011 1614        PENDING LABS:   RADIOGRAPHIC STUDIES:  Ir Gastrostomy Tube Mod Sed  08/20/2015  INDICATION: 65 year old with squamous cell carcinoma of the left tonsil. Port-A-Cath was placed immediately prior to placement of the gastrostomy tube. EXAM: PERCUTANEOUS GASTROSTOMY TUBE WITH FLUOROSCOPIC GUIDANCE Physician: Stephan Minister. Henn, MD MEDICATIONS: Ancef 2 g was given prior to placement of the Port-A-Cath. Glucagon 1 mg IV ANESTHESIA/SEDATION: Versed 2.0 mg IV; Fentanyl 25 mcg IV Moderate Sedation Time:  11 minutes The patient was continuously monitored during the procedure by the interventional radiology nurse under my direct supervision. FLUOROSCOPY TIME:  Fluoroscopy Time:  1 minutes, 24 seconds.  22 mGy CONTRAST:  5 mL Omnipaque [id number] COMPLICATIONS: None immediate. PROCEDURE: Informed consent was obtained for a percutaneous gastrostomy tube. The patient was placed on the interventional table. Fluoroscopy demonstrated oral contrast in the transverse colon. An orogastric tube was placed with fluoroscopic guidance. The anterior abdomen was prepped and draped in  sterile fashion. Maximal barrier sterile technique was utilized including caps, mask, sterile gowns, sterile gloves, sterile drape, hand hygiene and skin antiseptic. Stomach was inflated with air through the orogastric tube. The skin and subcutaneous tissues were anesthetized with 1% lidocaine. A 17 gauge needle was directed into the distended stomach with fluoroscopic guidance. A wire was advanced into the stomach and a T-tact was deployed. A  9-French vascular sheath was placed and the orogastric tube was snared using a [place] snare device. The orogastric tube and snare were pulled out of the patient's mouth. The snare device was connected to a 20-French gastrostomy tube. The snare device and gastrostomy tube were pulled through the patient's mouth and out the anterior abdominal wall. The gastrostomy tube was cut to an appropriate length. Contrast injection through gastrostomy tube confirmed placement within the stomach. Fluoroscopic images were obtained for documentation. The gastrostomy tube was flushed with normal saline. IMPRESSION: Successful fluoroscopic guided percutaneous gastrostomy tube placement. Electronically Signed   By: Markus Daft M.D.   On: 08/20/2015 16:39   Ir Fluoro Guide Cv Line Right  08/20/2015  INDICATION: 64 year old with squamous cell carcinoma of the left tonsil. Patient scheduled for Port-A-Cath placement and gastrostomy tube placement. Plan for Port-A-Cath placement prior to gastrostomy tube. EXAM: FLUOROSCOPIC AND ULTRASOUND GUIDED PLACEMENT OF A SUBCUTANEOUS PORT COMPARISON:  None. MEDICATIONS: Ancef 2 g; The antibiotic was administered within an appropriate time interval prior to skin puncture. ANESTHESIA/SEDATION: Versed 4.0 mg IV; Fentanyl 100 mcg IV; Moderate Sedation Time:  25 minutes The patient was continuously monitored during the procedure by the interventional radiology nurse under my direct supervision. FLUOROSCOPY TIME:  12 seconds, 3 mGy COMPLICATIONS: None  immediate. PROCEDURE: The procedure, risks, benefits, and alternatives were explained to the patient. Questions regarding the procedure were encouraged and answered. The patient understands and consents to the procedure. Patient was placed supine on the interventional table. Ultrasound confirmed a patent right internal jugular vein. The right chest and neck were cleaned with a skin antiseptic and a sterile drape was placed. Maximal barrier sterile technique was utilized including caps, mask, sterile gowns, sterile gloves, sterile drape, hand hygiene and skin antiseptic. The right neck was anesthetized with 1% lidocaine. Small incision was made in the right neck with a blade. Micropuncture set was placed in the right internal jugular vein with ultrasound guidance. The micropuncture wire was used for measurement purposes. The right chest was anesthetized with 1% lidocaine with epinephrine. #15 blade was used to make an incision and a subcutaneous port pocket was formed. [place] was assembled. Subcutaneous tunnel was formed with a stiff tunneling device. The port catheter was brought through the subcutaneous tunnel. The port was placed in the subcutaneous pocket. The micropuncture set was exchanged for a peel-away sheath. The catheter was placed through the peel-away sheath and the tip was positioned at the superior cavoatrial junction. Catheter placement was confirmed with fluoroscopy. The port was accessed and flushed with heparinized saline. The port pocket was closed using two layers of absorbable sutures and Dermabond. The vein skin site was closed using a single layer of absorbable suture and Dermabond. Sterile dressings were applied. Patient tolerated the procedure well without an immediate complication. Ultrasound and fluoroscopic images were taken and saved for this procedure. IMPRESSION: Placement of a subcutaneous port device. Catheter tip at the superior cavoatrial junction. Electronically  Signed   By: Markus Daft M.D.   On: 08/20/2015 16:37   Ir US Guide Vasc Access Right  08/20/2015  INDICATION: 65 year old with squamous cell carcinoma of the left tonsil. Patient scheduled for Port-A-Cath placement and gastrostomy tube placement. Plan for Port-A-Cath placement prior to gastrostomy tube. EXAM: FLUOROSCOPIC AND ULTRASOUND GUIDED PLACEMENT OF A SUBCUTANEOUS PORT COMPARISON:  None. MEDICATIONS: Ancef 2 g; The antibiotic was administered within an appropriate time interval prior to skin puncture. ANESTHESIA/SEDATION: Versed 4.0 mg IV; Fentanyl 100 mcg IV;  Moderate Sedation Time:  25 minutes The patient was continuously monitored during the procedure by the interventional radiology nurse under my direct supervision. FLUOROSCOPY TIME:  12 seconds, 3 mGy COMPLICATIONS: None immediate. PROCEDURE: The procedure, risks, benefits, and alternatives were explained to the patient. Questions regarding the procedure were encouraged and answered. The patient understands and consents to the procedure. Patient was placed supine on the interventional table. Ultrasound confirmed a patent right internal jugular vein. The right chest and neck were cleaned with a skin antiseptic and a sterile drape was placed. Maximal barrier sterile technique was utilized including caps, mask, sterile gowns, sterile gloves, sterile drape, hand hygiene and skin antiseptic. The right neck was anesthetized with 1% lidocaine. Small incision was made in the right neck with a blade. Micropuncture set was placed in the right internal jugular vein with ultrasound guidance. The micropuncture wire was used for measurement purposes. The right chest was anesthetized with 1% lidocaine with epinephrine. #15 blade was used to make an incision and a subcutaneous port pocket was formed. [place] was assembled. Subcutaneous tunnel was formed with a stiff tunneling device. The port catheter was brought through the subcutaneous tunnel. The port  was placed in the subcutaneous pocket. The micropuncture set was exchanged for a peel-away sheath. The catheter was placed through the peel-away sheath and the tip was positioned at the superior cavoatrial junction. Catheter placement was confirmed with fluoroscopy. The port was accessed and flushed with heparinized saline. The port pocket was closed using two layers of absorbable sutures and Dermabond. The vein skin site was closed using a single layer of absorbable suture and Dermabond. Sterile dressings were applied. Patient tolerated the procedure well without an immediate complication. Ultrasound and fluoroscopic images were taken and saved for this procedure. IMPRESSION: Placement of a subcutaneous port device. Catheter tip at the superior cavoatrial junction. Electronically Signed   By: Markus Daft M.D.   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  Oncology history is developed.  Staging in [facility] problem list is completed.  Labs today: CBC diff, CMET  Will continue with IV fluids weekly to prevent Cisplatin-induced renal failure  WEIGHT IS DONE SOME.  NATHAN FRANKS ADVISED  STILL WITH TINNITIS.  Less prevalent than last appointment.  DUKES MOUTWASH  Return as scheduled for follow-up and cycle #2 of treatment.   THERAPY PLAN:  We will continue with IV fluids 3 times per week to reduce the risk of cisplatin-induced complications. I will update Dr. 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 He will likely require a change in chemotherapy regimen given the early onset of this side effect.  All questions were answered. The patient knows to call the clinic with any problems, questions or concerns. We can certainly see the patient much  sooner if necessary.  Patient and plan discussed with Dr. Ancil Linsey and she is in agreement with the aforementioned.   This note is electronically signed by: Doy Mince 09/05/2015 9:39 AM

## 2015-09-03 NOTE — Assessment & Plan Note (Addendum)
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  Oncology history is developed.  Staging in [facility] problem list is completed.  Will continue with IV fluids 3 times weekly to prevent Cisplatin-induced complications.  His weight is down 2 pounds. I sent Frank Castillo a message regarding his weight loss. He admits to a decreased appetite in addition to decreased sensation of taste. Both contributing to his decreased oral intake.  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 I will update Dr. Whitney Muse regarding this as the patient will likely need a change in chemotherapy regimen as a result, particularly in light of early onset of adverse event.  On physical exam I suspect he has some oral thrush and therefore I will prescribe [place]ukes magic mouthwash 1 teaspoon 4 times daily as needed prior to consumption of meals. This too, may be contributing to his weight loss and decreased oral intake. He is educated on the proper way to swish and swallow this medication.  He requests "liquid Mucinex." I have electronically prescribed Robitussin to the patient's pharmacy.  Return as scheduled for follow-up and treatment as planned.

## 2015-09-05 ENCOUNTER — Encounter (HOSPITAL_COMMUNITY): Payer: Self-pay | Admitting: Oncology

## 2015-09-05 ENCOUNTER — Encounter (HOSPITAL_BASED_OUTPATIENT_CLINIC_OR_DEPARTMENT_OTHER): Payer: BLUE CROSS/BLUE SHIELD

## 2015-09-05 ENCOUNTER — Encounter (HOSPITAL_BASED_OUTPATIENT_CLINIC_OR_DEPARTMENT_OTHER): Payer: BLUE CROSS/BLUE SHIELD | Admitting: Oncology

## 2015-09-05 VITALS — BP 126/71 | HR 76 | Temp 97.8°F | Resp 18

## 2015-09-05 VITALS — BP 127/90 | HR 85 | Temp 97.8°F | Resp 18 | Wt 173.0 lb

## 2015-09-05 DIAGNOSIS — B370 Candidal stomatitis: Secondary | ICD-10-CM

## 2015-09-05 DIAGNOSIS — C090 Malignant neoplasm of tonsillar fossa: Secondary | ICD-10-CM

## 2015-09-05 DIAGNOSIS — Z95828 Presence of other vascular implants and grafts: Secondary | ICD-10-CM

## 2015-09-05 DIAGNOSIS — R05 Cough: Secondary | ICD-10-CM

## 2015-09-05 DIAGNOSIS — R058 Other specified cough: Secondary | ICD-10-CM

## 2015-09-05 MED ORDER — SODIUM CHLORIDE 0.9 % IV SOLN
INTRAVENOUS | Status: DC
  Administered 2015-09-05: 09:00:00 via INTRAVENOUS

## 2015-09-05 MED ORDER — SODIUM CHLORIDE 0.9% FLUSH
10.0000 mL | Freq: Once | INTRAVENOUS | Status: AC
  Administered 2015-09-05: 10 mL via INTRAVENOUS

## 2015-09-05 MED ORDER — HEPARIN SOD (PORK) LOCK FLUSH 100 UNIT/ML IV SOLN
500.0000 [IU] | Freq: Once | INTRAVENOUS | Status: AC
  Administered 2015-09-05: 500 [IU] via INTRAVENOUS
  Filled 2015-09-05: qty 5

## 2015-09-05 MED ORDER — GUAIFENESIN 100 MG/5ML PO SOLN: 5.0000 mL | Freq: Four times a day (QID) | ORAL | Status: DC | PRN

## 2015-09-05 MED ORDER — FIRST-DUKES MOUTHWASH MT SUSP: 5.0000 mL | Freq: Three times a day (TID) | OROMUCOSAL | Status: DC

## 2015-09-05 NOTE — Progress Notes (Signed)
Tolerated IV fluids well. Ambulatory on discharge home with sister.

## 2015-09-09 ENCOUNTER — Encounter (HOSPITAL_BASED_OUTPATIENT_CLINIC_OR_DEPARTMENT_OTHER): Payer: BLUE CROSS/BLUE SHIELD

## 2015-09-09 ENCOUNTER — Other Ambulatory Visit (HOSPITAL_COMMUNITY): Payer: Self-pay | Admitting: Oncology

## 2015-09-09 VITALS — BP 119/63 | HR 68 | Temp 97.5°F | Resp 16

## 2015-09-09 DIAGNOSIS — C099 Malignant neoplasm of tonsil, unspecified: Secondary | ICD-10-CM | POA: Diagnosis not present

## 2015-09-09 DIAGNOSIS — B370 Candidal stomatitis: Secondary | ICD-10-CM

## 2015-09-09 DIAGNOSIS — R05 Cough: Secondary | ICD-10-CM

## 2015-09-09 DIAGNOSIS — R058 Other specified cough: Secondary | ICD-10-CM

## 2015-09-09 MED ORDER — SODIUM CHLORIDE 0.9 % IV SOLN
INTRAVENOUS | Status: AC
  Administered 2015-09-09: 10:00:00 via INTRAVENOUS

## 2015-09-09 MED ORDER — GUAIFENESIN 100 MG/5ML PO SOLN: 5.0000 mL | Freq: Four times a day (QID) | ORAL | Status: DC | PRN

## 2015-09-09 MED ORDER — HEPARIN SOD (PORK) LOCK FLUSH 100 UNIT/ML IV SOLN
500.0000 [IU] | Freq: Once | INTRAVENOUS | Status: AC
  Administered 2015-09-09: 500 [IU] via INTRAVENOUS
  Filled 2015-09-09: qty 5

## 2015-09-09 MED ORDER — SODIUM CHLORIDE 0.9% FLUSH
10.0000 mL | Freq: Once | INTRAVENOUS | Status: AC
  Administered 2015-09-09: 10 mL via INTRAVENOUS

## 2015-09-09 NOTE — Patient Instructions (Signed)
[facility] Discharge Instructions  RECOMMENDATIONS MADE BY THE CONSULTANT AND ANY TEST RESULTS WILL BE SENT TO YOUR REFERRING PHYSICIAN.  IV fluids given today as ordered. Return as scheduled.  Thank you for choosing  Cancer Center at [facility] to provide your oncology and hematology care.  To afford each patient quality time with our provider, please arrive at least 15 minutes before your scheduled appointment time.   Beginning January 23rd 2017 lab work for the Cancer Center will be done in the  Main lab at [facility] on 1st floor. If you have a lab appointment with the Cancer Center please come in thru the  Main Entrance and check in at the main information desk  You need to re-schedule your appointment should you arrive 10 or more minutes late.  We strive to give you quality time with our providers, and arriving late affects you and other patients whose appointments are after yours.  Also, if you no show three or more times for appointments you may be dismissed from the clinic at the providers discretion.     Again, thank you for choosing [facility].  Our hope is that these requests will decrease the amount of time that you wait before being seen by our physicians.       _____________________________________________________________  Should you have questions after your visit to [facility], please contact our office at [phone] between the hours of 8:30 a.m. and 4:30 p.m.  Voicemails left after 4:30 p.m. will not be returned until the following business day.  For prescription refill requests, have your pharmacy contact our office.         Resources For Cancer Patients and their Caregivers ? American Cancer Society: Can assist with transportation, wigs, general needs, runs Look Good Feel Better.        1-888-227-6333 ? Cancer Care: Provides financial assistance, online support groups,  medication/co-pay assistance.  1-800-813-HOPE (4673) ? [facility] Assists [place] cancer patients and their families through emotional , educational and financial support.  336-427-4357 ? [place] DSS Where to apply for food stamps, Medicaid and utility assistance. 336-342-1394 ? [facility]: Transportation to medical appointments. 336-347-2287 ? Social Security Administration: May apply for disability if have a Stage IV cancer. 336-342-7796 1-800-772-1213 ? [place] Aging, Disability and Transit Services: Assists with nutrition, care and transit needs. 336-349-2343 

## 2015-09-09 NOTE — Progress Notes (Signed)
Tolerated IV fluids well. Ambulatory on discharge home to self. 

## 2015-09-11 ENCOUNTER — Encounter (HOSPITAL_COMMUNITY): Payer: Self-pay | Admitting: Hematology & Oncology

## 2015-09-11 ENCOUNTER — Encounter: Payer: Self-pay | Admitting: Dietician

## 2015-09-11 ENCOUNTER — Encounter (HOSPITAL_BASED_OUTPATIENT_CLINIC_OR_DEPARTMENT_OTHER): Payer: BLUE CROSS/BLUE SHIELD | Admitting: Hematology & Oncology

## 2015-09-11 ENCOUNTER — Encounter (HOSPITAL_BASED_OUTPATIENT_CLINIC_OR_DEPARTMENT_OTHER): Payer: BLUE CROSS/BLUE SHIELD

## 2015-09-11 VITALS — BP 139/84 | HR 94 | Temp 98.0°F | Resp 18 | Wt 175.8 lb

## 2015-09-11 DIAGNOSIS — K7460 Unspecified cirrhosis of liver: Secondary | ICD-10-CM

## 2015-09-11 DIAGNOSIS — K766 Portal hypertension: Secondary | ICD-10-CM | POA: Diagnosis not present

## 2015-09-11 DIAGNOSIS — C099 Malignant neoplasm of tonsil, unspecified: Secondary | ICD-10-CM | POA: Diagnosis not present

## 2015-09-11 DIAGNOSIS — Z5111 Encounter for antineoplastic chemotherapy: Secondary | ICD-10-CM

## 2015-09-11 DIAGNOSIS — C090 Malignant neoplasm of tonsillar fossa: Secondary | ICD-10-CM

## 2015-09-11 DIAGNOSIS — IMO0002 Reserved for concepts with insufficient information to code with codable children: Secondary | ICD-10-CM

## 2015-09-11 DIAGNOSIS — H9313 Tinnitus, bilateral: Secondary | ICD-10-CM

## 2015-09-11 DIAGNOSIS — E871 Hypo-osmolality and hyponatremia: Secondary | ICD-10-CM

## 2015-09-11 DIAGNOSIS — Z95828 Presence of other vascular implants and grafts: Secondary | ICD-10-CM

## 2015-09-11 DIAGNOSIS — R52 Pain, unspecified: Secondary | ICD-10-CM

## 2015-09-11 LAB — COMPREHENSIVE METABOLIC PANEL
ALK PHOS: 50 U/L (ref 38–126)
ALT: 14 U/L — AB (ref 17–63)
AST: 14 U/L — AB (ref 15–41)
Albumin: 4 g/dL (ref 3.5–5.0)
Anion gap: 6 (ref 5–15)
BUN: 17 mg/dL (ref 6–20)
CALCIUM: 9 mg/dL (ref 8.9–10.3)
CHLORIDE: 93 mmol/L — AB (ref 101–111)
CO2: 28 mmol/L (ref 22–32)
CREATININE: 0.78 mg/dL (ref 0.61–1.24)
GFR calc non Af Amer: >60 mL/min (ref >60)
Glucose, Bld: 129 mg/dL — ABNORMAL HIGH (ref 65–99)
Potassium: 4.1 mmol/L (ref 3.5–5.1)
Sodium: 127 mmol/L — ABNORMAL LOW (ref 135–145)
Total Bilirubin: 0.2 mg/dL — ABNORMAL LOW (ref 0.3–1.2)
Total Protein: 7 g/dL (ref 6.5–8.1)

## 2015-09-11 LAB — CBC WITH DIFFERENTIAL/PLATELET
BASOS ABS: 0.1 10*3/uL (ref 0.0–0.1)
Basophils Relative: 1 %
EOS PCT: 2 %
Eosinophils Absolute: 0.1 10*3/uL (ref 0.0–0.7)
HEMATOCRIT: 34.6 % — AB (ref 39.0–52.0)
HEMOGLOBIN: 11.9 g/dL — AB (ref 13.0–17.0)
LYMPHS ABS: 1.1 10*3/uL (ref 0.7–4.0)
LYMPHS PCT: 15 %
MCH: 29.5 pg (ref 26.0–34.0)
MCHC: 34.4 g/dL (ref 30.0–36.0)
MCV: 85.6 fL (ref 78.0–100.0)
Monocytes Absolute: 0.9 10*3/uL (ref 0.1–1.0)
Monocytes Relative: 12 %
NEUTROS ABS: 5.2 10*3/uL (ref 1.7–7.7)
NEUTROS PCT: 70 %
PLATELETS: 246 10*3/uL (ref 150–400)
RBC: 4.04 MIL/uL — AB (ref 4.22–5.81)
RDW: 15.4 % (ref 11.5–15.5)
WBC: 7.3 10*3/uL (ref 4.0–10.5)

## 2015-09-11 LAB — MAGNESIUM: MAGNESIUM: 1.9 mg/dL (ref 1.7–2.4)

## 2015-09-11 MED ORDER — POTASSIUM CHLORIDE 2 MEQ/ML IV SOLN
Freq: Once | INTRAVENOUS | Status: DC
  Filled 2015-09-11: qty 10

## 2015-09-11 MED ORDER — SODIUM CHLORIDE 0.9% FLUSH
10.0000 mL | INTRAVENOUS | Status: DC | PRN
  Administered 2015-09-11: 10 mL
  Filled 2015-09-11: qty 10

## 2015-09-11 MED ORDER — HEPARIN SOD (PORK) LOCK FLUSH 100 UNIT/ML IV SOLN: 500.0000 [IU] | Freq: Once | INTRAVENOUS | Status: DC | PRN

## 2015-09-11 MED ORDER — SODIUM CHLORIDE 0.9 % IV SOLN
10.0000 mg | Freq: Once | INTRAVENOUS | Status: AC
  Administered 2015-09-11: 10 mg via INTRAVENOUS
  Filled 2015-09-11: qty 1

## 2015-09-11 MED ORDER — CARBOPLATIN CHEMO INJECTION 600 MG/60ML
664.5000 mg | Freq: Once | INTRAVENOUS | Status: AC
  Administered 2015-09-11: 660 mg via INTRAVENOUS
  Filled 2015-09-11: qty 66

## 2015-09-11 MED ORDER — SODIUM CHLORIDE 0.9% FLUSH: 10.0000 mL | INTRAVENOUS | Status: DC | PRN

## 2015-09-11 MED ORDER — SODIUM CHLORIDE 0.9 % IV SOLN
1000.0000 mg/m2/d | INTRAVENOUS | Status: DC
  Administered 2015-09-11: 7900 mg via INTRAVENOUS
  Filled 2015-09-11: qty 158

## 2015-09-11 MED ORDER — SODIUM CHLORIDE 0.9 % IV SOLN
Freq: Once | INTRAVENOUS | Status: AC
  Administered 2015-09-11: 11:00:00 via INTRAVENOUS

## 2015-09-11 MED ORDER — PALONOSETRON HCL INJECTION 0.25 MG/5ML
0.2500 mg | Freq: Once | INTRAVENOUS | Status: AC
  Administered 2015-09-11: 0.25 mg via INTRAVENOUS
  Filled 2015-09-11: qty 5

## 2015-09-11 NOTE — Progress Notes (Signed)
Tolerated chemo well. Continuous infusion pump intact. Ambulatory on discharge home with sister.

## 2015-09-11 NOTE — Progress Notes (Signed)
Patient was noted as suffering significant wt loss shortly after initiation of therapy.   Contacted Pt by Visiting during infusion  Wt Readings from Last 10 Encounters:  09/11/15 175 lb 12.8 oz (79.742 kg)  09/05/15 173 lb (78.472 kg)  08/28/15 175 lb 12.8 oz (79.742 kg)  08/21/15 182 lb (82.555 kg)  08/02/15 183 lb 3.2 oz (83.099 kg)  07/31/15 180 lb 6.4 oz (81.829 kg)  07/24/15 176 lb (79.833 kg)  07/09/15 179 lb (81.194 kg)   Patient weight has fallen by 7 pounds since TF regimen first discussed.   Pt reports that he had not started Tube feeding until this last Friday (4-14) and this is likely the reason he had lost such a significant amount of weight. Now, he has been able to consistently administer 6 cans each day and has gotten to 7 a couple times.   He denies any tolerance issues. He notes a couple instances where he had small bout of diarrhea, but this is not on a consistent basis.   Pt states that his oral intake now is extremely minimal. Reiterated that the goal for his particular TF regimen is 8 cans, especially when he is not consuming anything by mouth. Also, reemphasized that the flushes are just as important as the formula itself.   Pt reports his flushing regimen is very ad lib. He sometimes flushes with the appropriate amount and sometimes he doesn't flush at all, with the majority of his flushes falling somewhere in the middle.   Pt's weight has increased by 1 lb since his TF initiation.   Pt seemed to be largely indifferent to RD present. He has no concerns or issues and doesn't seem to be at all concerned about his current nutrition. His number 1 complaint is the mucous and drainage that is a side effect of radiation. He still has a ways to go with his radiotherapy  Will continue to follow and will contact if nutritional status declines  [name] RD, LDN Nutrition Pager: [phone] 09/11/2015 1:12 PM

## 2015-09-11 NOTE — Patient Instructions (Addendum)
[place] at [facility] Discharge Instructions  RECOMMENDATIONS MADE BY THE CONSULTANT AND ANY TEST RESULTS WILL BE SENT TO YOUR REFERRING PHYSICIAN.   Exam and discussion by Dr Whitney Muse today Chemotherapy changed to 5FU and carboplatin Return for fluids as scheduled Return to see the doctor as scheduled Please call the clinic if you have any questions or concerns       Carboplatin injection What is this medicine? CARBOPLATIN (KAR boe pla tin) is a chemotherapy drug. It targets fast dividing cells, like cancer cells, and causes these cells to die. This medicine is used to treat ovarian cancer and many other cancers. This medicine may be used for other purposes; ask your health care provider or pharmacist if you have questions. What should I tell my health care provider before I take this medicine? They need to know if you have any of these conditions: -blood disorders -hearing problems -kidney disease -recent or ongoing radiation therapy -an unusual or allergic reaction to carboplatin, cisplatin, other chemotherapy, other medicines, foods, dyes, or preservatives -pregnant or trying to get pregnant -breast-feeding How should I use this medicine? This drug is usually given as an infusion into a vein. It is administered in a hospital or clinic by a specially trained health care professional. Talk to your pediatrician regarding the use of this medicine in children. Special care may be needed. Overdosage: If you think you have taken too much of this medicine contact a poison control center or emergency room at once. NOTE: This medicine is only for you. Do not share this medicine with others. What if I miss a dose? It is important not to miss a dose. Call your doctor or health care professional if you are unable to keep an appointment. What may interact with this medicine? -medicines for seizures -medicines to increase blood counts like filgrastim,  pegfilgrastim, sargramostim -some antibiotics like amikacin, gentamicin, neomycin, streptomycin, tobramycin -vaccines Talk to your doctor or health care professional before taking any of these medicines: -acetaminophen -aspirin -ibuprofen -ketoprofen -naproxen This list may not describe all possible interactions. Give your health care provider a list of all the medicines, herbs, non-prescription drugs, or dietary supplements you use. Also tell them if you smoke, drink alcohol, or use illegal drugs. Some items may interact with your medicine. What should I watch for while using this medicine? Your condition will be monitored carefully while you are receiving this medicine. You will need important blood work done while you are taking this medicine. This drug may make you feel generally unwell. This is not uncommon, as chemotherapy can affect healthy cells as well as cancer cells. Report any side effects. Continue your course of treatment even though you feel ill unless your doctor tells you to stop. In some cases, you may be given additional medicines to help with side effects. Follow all directions for their use. Call your doctor or health care professional for advice if you get a fever, chills or sore throat, or other symptoms of a cold or flu. Do not treat yourself. This drug decreases your body's ability to fight infections. Try to avoid being around people who are sick. This medicine may increase your risk to bruise or bleed. Call your doctor or health care professional if you notice any unusual bleeding. Be careful brushing and flossing your teeth or using a toothpick because you may get an infection or bleed more easily. If you have any dental work done, tell your dentist you are receiving this  medicine. Avoid taking products that contain aspirin, acetaminophen, ibuprofen, naproxen, or ketoprofen unless instructed by your doctor. These medicines may hide a fever. Do not become pregnant while  taking this medicine. Women should inform their doctor if they wish to become pregnant or think they might be pregnant. There is a potential for serious side effects to an unborn child. Talk to your health care professional or pharmacist for more information. Do not breast-feed an infant while taking this medicine. What side effects may I notice from receiving this medicine? Side effects that you should report to your doctor or health care professional as soon as possible: -allergic reactions like skin rash, itching or hives, swelling of the face, lips, or tongue -signs of infection - fever or chills, cough, sore throat, pain or difficulty passing urine -signs of decreased platelets or bleeding - bruising, pinpoint red spots on the skin, black, tarry stools, nosebleeds -signs of decreased red blood cells - unusually weak or tired, fainting spells, lightheadedness -breathing problems -changes in hearing -changes in vision -chest pain -high blood pressure -low blood counts - This drug may decrease the number of white blood cells, red blood cells and platelets. You may be at increased risk for infections and bleeding. -nausea and vomiting -pain, swelling, redness or irritation at the injection site -pain, tingling, numbness in the hands or feet -problems with balance, talking, walking -trouble passing urine or change in the amount of urine Side effects that usually do not require medical attention (report to your doctor or health care professional if they continue or are bothersome): -hair loss -loss of appetite -metallic taste in the mouth or changes in taste This list may not describe all possible side effects. Call your doctor for medical advice about side effects. You may report side effects to FDA at 1-800-FDA-1088. Where should I keep my medicine? This drug is given in a hospital or clinic and will not be stored at home. NOTE: This sheet is a summary. It may not cover all possible  information. If you have questions about this medicine, talk to your doctor, pharmacist, or health care provider.    2016, Elsevier/Gold Standard. (2007-08-16 14:38:05)    Fluorouracil, 5-FU injection What is this medicine? FLUOROURACIL, 5-FU (flure oh YOOR a sil) is a chemotherapy drug. It slows the growth of cancer cells. This medicine is used to treat many types of cancer like breast cancer, colon or rectal cancer, pancreatic cancer, and stomach cancer. This medicine may be used for other purposes; ask your health care provider or pharmacist if you have questions. What should I tell my health care provider before I take this medicine? They need to know if you have any of these conditions: -blood disorders -dihydropyrimidine dehydrogenase (DPD) deficiency -infection (especially a virus infection such as chickenpox, cold sores, or herpes) -kidney disease -liver disease -malnourished, poor nutrition -recent or ongoing radiation therapy -an unusual or allergic reaction to fluorouracil, other chemotherapy, other medicines, foods, dyes, or preservatives -pregnant or trying to get pregnant -breast-feeding How should I use this medicine? This drug is given as an infusion or injection into a vein. It is administered in a hospital or clinic by a specially trained health care professional. Talk to your pediatrician regarding the use of this medicine in children. Special care may be needed. Overdosage: If you think you have taken too much of this medicine contact a poison control center or emergency room at once. NOTE: This medicine is only for you. Do not share this medicine  with others. What if I miss a dose? It is important not to miss your dose. Call your doctor or health care professional if you are unable to keep an appointment. What may interact with this medicine? -allopurinol -cimetidine -dapsone -digoxin -hydroxyurea -leucovorin -levamisole -medicines for seizures like  ethotoin, fosphenytoin, phenytoin -medicines to increase blood counts like filgrastim, pegfilgrastim, sargramostim -medicines that treat or prevent blood clots like warfarin, enoxaparin, and dalteparin -methotrexate -metronidazole -pyrimethamine -some other chemotherapy drugs like busulfan, cisplatin, estramustine, vinblastine -trimethoprim -trimetrexate -vaccines Talk to your doctor or health care professional before taking any of these medicines: -acetaminophen -aspirin -ibuprofen -ketoprofen -naproxen This list may not describe all possible interactions. Give your health care provider a list of all the medicines, herbs, non-prescription drugs, or dietary supplements you use. Also tell them if you smoke, drink alcohol, or use illegal drugs. Some items may interact with your medicine. What should I watch for while using this medicine? Visit your doctor for checks on your progress. This drug may make you feel generally unwell. This is not uncommon, as chemotherapy can affect healthy cells as well as cancer cells. Report any side effects. Continue your course of treatment even though you feel ill unless your doctor tells you to stop. In some cases, you may be given additional medicines to help with side effects. Follow all directions for their use. Call your doctor or health care professional for advice if you get a fever, chills or sore throat, or other symptoms of a cold or flu. Do not treat yourself. This drug decreases your body's ability to fight infections. Try to avoid being around people who are sick. This medicine may increase your risk to bruise or bleed. Call your doctor or health care professional if you notice any unusual bleeding. Be careful brushing and flossing your teeth or using a toothpick because you may get an infection or bleed more easily. If you have any dental work done, tell your dentist you are receiving this medicine. Avoid taking products that contain aspirin,  acetaminophen, ibuprofen, naproxen, or ketoprofen unless instructed by your doctor. These medicines may hide a fever. Do not become pregnant while taking this medicine. Women should inform their doctor if they wish to become pregnant or think they might be pregnant. There is a potential for serious side effects to an unborn child. Talk to your health care professional or pharmacist for more information. Do not breast-feed an infant while taking this medicine. Men should inform their doctor if they wish to father a child. This medicine may lower sperm counts. Do not treat diarrhea with over the counter products. Contact your doctor if you have diarrhea that lasts more than 2 days or if it is severe and watery. This medicine can make you more sensitive to the sun. Keep out of the sun. If you cannot avoid being in the sun, wear protective clothing and use sunscreen. Do not use sun lamps or tanning beds/booths. What side effects may I notice from receiving this medicine? Side effects that you should report to your doctor or health care professional as soon as possible: -allergic reactions like skin rash, itching or hives, swelling of the face, lips, or tongue -low blood counts - this medicine may decrease the number of white blood cells, red blood cells and platelets. You may be at increased risk for infections and bleeding. -signs of infection - fever or chills, cough, sore throat, pain or difficulty passing urine -signs of decreased platelets or bleeding - bruising, pinpoint  red spots on the skin, black, tarry stools, blood in the urine -signs of decreased red blood cells - unusually weak or tired, fainting spells, lightheadedness -breathing problems -changes in vision -chest pain -mouth sores -nausea and vomiting -pain, swelling, redness at site where injected -pain, tingling, numbness in the hands or feet -redness, swelling, or sores on hands or feet -stomach pain -unusual bleeding Side effects  that usually do not require medical attention (report to your doctor or health care professional if they continue or are bothersome): -changes in finger or toe nails -diarrhea -dry or itchy skin -hair loss -headache -loss of appetite -sensitivity of eyes to the light -stomach upset -unusually teary eyes This list may not describe all possible side effects. Call your doctor for medical advice about side effects. You may report side effects to FDA at 1-800-FDA-1088. Where should I keep my medicine? This drug is given in a hospital or clinic and will not be stored at home. NOTE: This sheet is a summary. It may not cover all possible information. If you have questions about this medicine, talk to your doctor, pharmacist, or health care provider.    2016, Elsevier/Gold Standard. (2007-09-14 13:53:16)    Thank you for choosing [place] at [facility] to provide your oncology and hematology care.  To afford each patient quality time with our provider, please arrive at least 15 minutes before your scheduled appointment time.   Beginning January 23rd 2017 lab work for the [facility] will be done in the  Main lab at [facility] on 1st floor. If you have a lab appointment with the [facility] please come in thru the  Main Entrance and check in at the main information desk  You need to re-schedule your appointment should you arrive 10 or more minutes late.  We strive to give you quality time with our providers, and arriving late affects you and other patients whose appointments are after yours.  Also, if you no show three or more times for appointments you may be dismissed from the clinic at the providers discretion.     Again, thank you for choosing [facility].  Our hope is that these requests will decrease the amount of time that you wait before being seen by our physicians.       _____________________________________________________________  Should you  have questions after your visit to [facility], please contact our office at [phone] between the hours of 8:30 a.m. and 4:30 p.m.  Voicemails left after 4:30 p.m. will not be returned until the following business day.  For prescription refill requests, have your pharmacy contact our office.         Resources For Cancer Patients and their Caregivers ? American Cancer Society: Can assist with transportation, wigs, general needs, runs Look Good Feel Better.        (860)368-1208 ? Cancer Care: Provides financial assistance, online support groups, medication/co-pay assistance.  1-800-813-HOPE (305)062-7539) ? [place] Assists [place] cancer patients and their families through emotional , educational and financial support.  (424)187-9804 ? [place] DSS Where to apply for food stamps, Medicaid and utility assistance. 365-835-0524 ? [facility]: Transportation to medical appointments. 251-336-6242 ? Social Security Administration: May apply for disability if have a Stage IV cancer. 531-866-5270 417-618-1373 ? [employer], Disability and Transit Services: Assists with nutrition, care and transit needs. 339-447-6852

## 2015-09-11 NOTE — Progress Notes (Signed)
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 He is seen at [facility], Hepatitis C negative CT neck with contrast 06/25/2015 at [place] level 2 malignant LAD, largest node is 3.5 cm long axis. R worse than L carotid atherosclerosis and stenosis. CT chest with no acute or significant findings within the chest   HISTORY OF PRESENTING ILLNESS:  Frank Castillo 65 y.o. male is here for further follow-up of Stage IVA T2N2bM0 squamous cell Ca of the L tonsil. Left tonsillectomy performed on 07/09/2015 with Dr. Benjamine Mola.  Frank Castillo is accompanied by his sister. He is here for Cycle #2 Cisplatin today.  Reports the ear ringing is consistent and pretty bad overall. Later in our visit, he admits it has worsened. He is becoming more used to the tinnitus, and states it is no longer as much of an obstacle. He did have some issue while at [facility] when an alarm noise along with the tinnitus was severe. He is concerned what this may mean for his hearing moving forward.  He has been using salt and soda rinses. He continues to experience thick secretions which may be contributing to his nausea. He gets up in the middle of the night with thick mucous in his mouth that he has to rinse out. He is using mucinex and trying to have adequate fluid intake but he notes this is becoming more challenging.  He had 7 cans of Boost yesterday via PEG. He had 2 this morning and brought 2 for lunch. He is still able to swallow his medications, except for Mucinex pills.  Pain is well controlled.   MEDICAL HISTORY:  Past Medical History  Diagnosis Date   Tonsillar mass     left   Hypertension      Cirrhosis ([place])    Portal hypertension ([place])     SURGICAL HISTORY: Past Surgical History  Procedure Laterality Date   Hernia repair Right    Mouth surgery     Tonsillectomy Left 07/09/2015    Procedure: LEFT TONSILLECTOMY;  Surgeon: Leta Baptist, MD;  Location: [facility];  Service: ENT;  Laterality: Left;    SOCIAL HISTORY: Social History   Social History   Marital Status: Single    Spouse Name: N/A   Number of Children: 0   Years of Education: N/A   Occupational History   Not on file.   Social History Main Topics   Smoking status: Current Every Day Smoker -- 0.50 packs/day    Types: Cigarettes   Smokeless tobacco: Never Used   Alcohol Use: No     Comment: former abuse- quit 09/2011   Drug Use: No   Sexual Activity: Not on file   Other Topics Concern   Not on file   Social History Narrative   Divorced.   No children.   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   Currently smoking approximately one pack per day.   Has been smoking off/on since age 64.   Divorced No children. Just step-children. 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 Previously worked in [employer]. Originally from [place], [place]. Current smoker at 1 ppd. Began smoking at 42-17 yo, regularly smoking at [age] yo. During his "cocaine  years" he smoked about 2 ppd. ETOH, recovering alcoholic. 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 He enjoys reading. 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  FAMILY HISTORY: Family History  Problem Relation Age of Onset   Macular degeneration Mother    Alcoholism Father    indicated that his mother is alive. He indicated that his father is deceased.   Mother still living at 53 years-old, with macular degeneration. She has no other health issues. Father deceased. Parents divorced at 73 years-old, so he is not sure what he died from. Father was an alcoholic. 4 sisters. 1 sister died of suicide. 1 sister was  adopted. 1 Sister lives in [place]. 1 Sister lives in [place].  ALLERGIES:  is allergic to bee venom.  MEDICATIONS:  Current Outpatient Prescriptions  Medication Sig Dispense Refill   cetirizine (ZYRTEC) 10 MG tablet Take 10 mg by mouth daily.     cholecalciferol (VITAMIN D) 1000 units tablet Take 1,000 Units by mouth daily.     CISPLATIN IV Inject into the vein every 21 ( twenty-one) days.     Diphenhyd-Hydrocort-Nystatin (FIRST-DUKES MOUTHWASH) SUSP Use as directed 5 mLs in the mouth or throat 4 (four) times daily -  before meals and at bedtime. 240 mL 1   guaiFENesin (ROBITUSSIN) 100 MG/5ML SOLN Take 5 mLs (100 mg total) by mouth every 6 (six) hours as needed for cough or to loosen phlegm. 240 mL 1   lidocaine-prilocaine (EMLA) cream Apply a quarter size amount to port site 1 hour prior to chemo. Do not rub in. Cover with plastic wrap. 30 g 3   lisinopril-hydrochlorothiazide (PRINZIDE,ZESTORETIC) 20-25 MG tablet Take 1 tablet by mouth daily.  11   Melatonin 10 MG TABS Take by mouth.     nicotine (NICODERM CQ) 14 mg/24hr patch apply 21 mg patch daily x6wk, then 14 mg patch daily x2wk, then 7 mg patch daily x2wk 14 patch 0   nicotine (NICODERM CQ) 21 mg/24hr patch apply 21 mg patch daily x6wk, then 14 mg patch daily x2wk, then 7 mg patch daily x2wk 21 patch 1   nicotine (NICODERM CQ) 7 mg/24hr patch apply 21 mg patch daily x6wk, then 14 mg patch daily x2wk, then 7 mg patch daily x2wk 14 patch 0   Nutritional Supplements (JEVITY 1.2 CAL/FIBER) LIQD 8 cans/day.  4 feeding of 2 cans each. Flush with 100 mls before and after each feeding 1000 mL 6   ondansetron (ZOFRAN ODT) 8 MG disintegrating tablet Take 1 tablet (8 mg total) by mouth every 8 (eight) hours as needed for nausea or vomiting. 30 tablet 2   oxyCODONE (ROXICODONE) 5 MG/5ML solution Take 5-10 mLs (5-10 mg total) by mouth every 4 (four) hours as needed for severe pain. 250 mL 0   oxyCODONE-acetaminophen (ROXICET) 5-325 MG  tablet Take 1-2 tablets by mouth every 4 (four) hours as needed for severe pain. 30 tablet 0   pantoprazole (PROTONIX) 40 MG tablet Take 1 tablet (40 mg total) by mouth daily. 30 tablet 3   Pegfilgrastim (NEULASTA ONPRO [facility]) Inject into the skin. Every 21 days     prochlorperazine (COMPAZINE) 10 MG tablet Take 1 tablet (10 mg total) by mouth every 6 (six) hours as needed for nausea or vomiting. 30 tablet 2   sodium fluoride (FLUORISHIELD) 1.1 % GEL dental gel Instill one drop of gel per tooth space of fluoride tray. Place over teeth for 5 minutes. Remove. Spit out excess. Repeat nightly. 120 mL prn   No current  facility-administered medications for this visit.   Facility-Administered Medications Ordered in Other Visits  Medication Dose Route Frequency Provider Last Rate Last Dose   dextrose 5 % and 0.45% NaCl 1,000 mL with potassium chloride 20 mEq, magnesium sulfate 12 mEq, mannitol 12.5 g infusion   Intravenous Once Patrici Ranks, MD       heparin lock flush 100 unit/mL  500 Units Intracatheter Once PRN Patrici Ranks, MD       sodium chloride flush (NS) 0.9 % injection 10 mL  10 mL Intracatheter PRN Patrici Ranks, MD        Review of Systems  Constitutional: Negative for fever, chills and malaise/fatigue.  HENT: Positive for tinnitus and sore throat. Negative for congestion, hearing loss and nosebleeds.        L neck mass . Tinnitus is constant. Eyes: Negative.  Negative for blurred vision, double vision, pain and discharge.  Respiratory: Positive for sputum production.  Negative for cough, hemoptysis, shortness of breath and wheezing.   Thick secretions.  Cardiovascular: Negative.  Negative for chest pain, palpitations, claudication, leg swelling and PND.  Gastrointestinal: Positive for nausea.  Negative for heartburn, vomiting, abdominal pain, diarrhea, blood in stool and melena.  Nausea attributed to swallowing thick secretions. Genitourinary: Negative.  Negative for  dysuria, urgency, frequency and hematuria.  Musculoskeletal: Negative.  Negative for myalgias, joint pain and falls.  Skin: Negative.  Negative for itching and rash.  Neurological: Negative.  Negative for dizziness, tingling, tremors, sensory change, speech change, focal weakness, seizures, loss of consciousness, weakness and headaches.  Endo/Heme/Allergies: Negative.  Does not bruise/bleed easily.  Psychiatric/Behavioral: Negative.  Negative for depression, suicidal ideas, memory loss and substance abuse. The patient is not nervous/anxious and does not have insomnia.   All other systems reviewed and are negative.  14 point ROS was done and is otherwise as detailed above or in HPI   PHYSICAL EXAMINATION: Vitals with BMI 09/11/2015  Height   Weight 175 lbs 13 oz  BMI   Systolic 981  Diastolic 84  Pulse 94  Respirations 18   Physical Exam  Constitutional: He is oriented to person, place, and time and well-developed, well-nourished, and in no distress.  HENT:  Head: Normocephalic and atraumatic.  Nose: Nose normal.  Mouth/Throat: Thick secretions. Posterior erythema noted Eyes: Conjunctivae and EOM are normal. Pupils are equal, round, and reactive to light. Right eye exhibits no discharge. Left eye exhibits no discharge. No scleral icterus.  Neck: Normal range of motion. Neck supple. No tracheal deviation present. No thyromegaly present.  Erythema from XRT, barely palpable Left neck LN Cardiovascular: Normal rate, regular rhythm and normal heart sounds.  Exam reveals no gallop and no friction rub.  Port site is C/D/I no erythema or bruising No murmur heard. Pulmonary/Chest: Effort normal and breath sounds normal. He has no wheezes. He has no rales.  Abdominal: Soft. Bowel sounds are normal. He exhibits no distension and no mass. There is no tenderness. There is no rebound and no guarding. FT site is dressed Musculoskeletal: Normal range of motion. He exhibits no edema.  Neurological:  He is alert and oriented to person, place, and time. He has normal reflexes. No cranial nerve deficit. Gait normal. Coordination normal.  Skin: Skin is warm and dry. No rash noted.  Psychiatric: Mood, memory, affect and judgment normal.  Nursing note and vitals reviewed.   LABORATORY DATA:  I have reviewed the data as listed Results for Frank, Castillo (MRN [id number]) as of  09/11/2015 08:29  Results for Frank, Castillo (MRN [id number]) as of 09/11/2015 13:51  Ref. Range 09/11/2015 09:00  Sodium Latest Ref Range: 135-145 mmol/L 127 (L)  Potassium Latest Ref Range: 3.5-5.1 mmol/L 4.1  Chloride Latest Ref Range: 101-111 mmol/L 93 (L)  CO2 Latest Ref Range: 22-32 mmol/L 28  BUN Latest Ref Range: 6-20 mg/dL 17  Creatinine Latest Ref Range: 0.61-1.24 mg/dL 0.78  Calcium Latest Ref Range: 8.9-10.3 mg/dL 9.0  EGFR (Non-African Amer.) Latest Ref Range: >60 mL/min >60  EGFR (African American) Latest Ref Range: >60 mL/min >60  Glucose Latest Ref Range: 65-99 mg/dL 129 (H)  Anion gap Latest Ref Range: 5-15  6  Magnesium Latest Ref Range: 1.7-2.4 mg/dL 1.9  Alkaline Phosphatase Latest Ref Range: 38-126 U/L 50  Albumin Latest Ref Range: 3.5-5.0 g/dL 4.0  AST Latest Ref Range: 15-41 U/L 14 (L)  ALT Latest Ref Range: 17-63 U/L 14 (L)  Total Protein Latest Ref Range: 6.5-8.1 g/dL 7.0  Total Bilirubin Latest Ref Range: 0.3-1.2 mg/dL 0.2 (L)  WBC Latest Ref Range: 4.0-10.5 K/uL 7.3  RBC Latest Ref Range: 4.22-5.81 MIL/uL 4.04 (L)  Hemoglobin Latest Ref Range: 13.0-17.0 g/dL 11.9 (L)  HCT Latest Ref Range: 39.0-52.0 % 34.6 (L)  MCV Latest Ref Range: 78.0-100.0 fL 85.6  MCH Latest Ref Range: 26.0-34.0 pg 29.5  MCHC Latest Ref Range: 30.0-36.0 g/dL 34.4  RDW Latest Ref Range: 11.5-15.5 % 15.4  Platelets Latest Ref Range: 150-400 K/uL 246  Neutrophils Latest Units: % 70  Lymphocytes Latest Units: % 15  Monocytes Relative Latest Units: % 12  Eosinophil Latest Units: % 2  Basophil Latest Units: % 1   NEUT# Latest Ref Range: 1.7-7.7 K/uL 5.2  Lymphocyte # Latest Ref Range: 0.7-4.0 K/uL 1.1  Monocyte # Latest Ref Range: 0.1-1.0 K/uL 0.9  Eosinophils Absolute Latest Ref Range: 0.0-0.7 K/uL 0.1  Basophils Absolute Latest Ref Range: 0.0-0.1 K/uL 0.1    RADIOGRAPHIC STUDIES: I have personally reviewed the radiological reports as listed.  Study Result     CLINICAL DATA: Initial treatment strategy for primary tonsillar squamous cell carcinoma. Left-sided lung tonsillectomy.  EXAM: NUCLEAR MEDICINE PET SKULL BASE TO THIGH  TECHNIQUE: 8.8 mCi F-18 FDG was injected intravenously. Full-ring PET imaging was performed from the skull base to thigh after the radiotracer. CT data was obtained and used for attenuation correction and anatomic localization.  FASTING BLOOD GLUCOSE: Value: 116 mg/dl  COMPARISON: Clinic note of 07/24/2015. Neck and chest CTs of 06/25/2015.  FINDINGS: NECK  No tonsilar hypermetabolism.  Hypermetabolic left-sided level 2 nodes. Index node measures 2.1 cm and a S.U.V. max of 17.9 on image 25/series 4. 1.9 cm on 06/25/2015.  Immediately inferior node measures 9 mm and a S.U.V. max of 3.8 on image 30/series 4.  CHEST  No areas of abnormal hypermetabolism.  ABDOMEN/PELVIS  No areas of abnormal hypermetabolism.  SKELETON  No abnormal marrow activity.  CT IMAGES PERFORMED FOR ATTENUATION CORRECTION  carotid atherosclerosis. Mild cardiomegaly with multivessel coronary artery atherosclerosis. Mild centrilobular emphysema. Mild bibasilar atelectasis. Normal adrenal glands. Gallstones and a contracted gallbladder. Moderate cirrhosis. Advanced abdominal aortic atherosclerosis. Scattered colonic diverticula. Mild prostatomegaly. Fat containing left inguinal hernia. Prior right inguinal hernia repair.  IMPRESSION: 1. Hypermetabolic left-sided level 2 adenopathy, consistent with nodal metastasis. 2. No extracervical metastatic  disease identified. 3. Cirrhosis. 4. Atherosclerosis, including within the coronary arteries. 5. Cholelithiasis.   Electronically Signed  By: Abigail Miyamoto M.D.  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 He is seen at [facility], Hepatitis C negative CT neck with contrast 06/25/2015 at [place] level 2 malignant LAD, largest node is 3.5 cm long axis. R worse than L carotid atherosclerosis and stenosis. CT chest with no acute or significant findings within the chest Stage IVA T2N2bM0  Tinnitis/mild hearing loss  I am concerned about additional Cisplatin. We discussed options today.  No head to head comparison of carbo/5-FU vs. Cisplatin. We reviewed the NCCN guidelines.  He has already had a good response to therapy. He is very active and notes the tinnitis is significant. After a lengthy discussion we have opted to treat with carboplatin/5-FU for his final two cycles. He will receive teaching today.    Hyponatremia  We will hydrate today. He was encouraged to get adequate free water via his PEG. We will continue with ongoing hydration as ordered moving forward.   Pain  Controlled.  RTC 1 weeks with labs.   All questions were answered. The patient knows to call the clinic with any problems, questions or concerns.  This document serves as a record of services personally performed by Ancil Linsey, MD. It was created on her behalf by Arlyce Harman, a trained medical scribe. The creation of this record is based on the scribe's personal observations and the provider's statements to them. This document has been checked and approved by the attending provider.  I have reviewed the above documentation for accuracy and completeness, and I agree with the above.  This note was electronically signed.  Molli Hazard,  MD  09/11/2015 8:45 AM

## 2015-09-11 NOTE — Patient Instructions (Signed)
Carboplatin injection  What is this medicine?  CARBOPLATIN (KAR boe pla tin) is a chemotherapy drug. It targets fast dividing cells, like cancer cells, and causes these cells to die. This medicine is used to treat ovarian cancer and many other cancers.  This medicine may be used for other purposes; ask your health care provider or pharmacist if you have questions.  What should I tell my health care provider before I take this medicine?  They need to know if you have any of these conditions:  -blood disorders  -hearing problems  -kidney disease  -recent or ongoing radiation therapy  -an unusual or allergic reaction to carboplatin, cisplatin, other chemotherapy, other medicines, foods, dyes, or preservatives  -pregnant or trying to get pregnant  -breast-feeding  How should I use this medicine?  This drug is usually given as an infusion into a vein. It is administered in a hospital or clinic by a specially trained health care professional.  Talk to your pediatrician regarding the use of this medicine in children. Special care may be needed.  Overdosage: If you think you have taken too much of this medicine contact a poison control center or emergency room at once.  NOTE: This medicine is only for you. Do not share this medicine with others.  What if I miss a dose?  It is important not to miss a dose. Call your doctor or health care professional if you are unable to keep an appointment.  What may interact with this medicine?  -medicines for seizures  -medicines to increase blood counts like filgrastim, pegfilgrastim, sargramostim  -some antibiotics like amikacin, gentamicin, neomycin, streptomycin, tobramycin  -vaccines  Talk to your doctor or health care professional before taking any of these medicines:  -acetaminophen  -aspirin  -ibuprofen  -ketoprofen  -naproxen  This list may not describe all possible interactions. Give your health care provider a list of all the medicines, herbs, non-prescription drugs, or dietary  supplements you use. Also tell them if you smoke, drink alcohol, or use illegal drugs. Some items may interact with your medicine.  What should I watch for while using this medicine?  Your condition will be monitored carefully while you are receiving this medicine. You will need important blood work done while you are taking this medicine.  This drug may make you feel generally unwell. This is not uncommon, as chemotherapy can affect healthy cells as well as cancer cells. Report any side effects. Continue your course of treatment even though you feel ill unless your doctor tells you to stop.  In some cases, you may be given additional medicines to help with side effects. Follow all directions for their use.  Call your doctor or health care professional for advice if you get a fever, chills or sore throat, or other symptoms of a cold or flu. Do not treat yourself. This drug decreases your body's ability to fight infections. Try to avoid being around people who are sick.  This medicine may increase your risk to bruise or bleed. Call your doctor or health care professional if you notice any unusual bleeding.  Be careful brushing and flossing your teeth or using a toothpick because you may get an infection or bleed more easily. If you have any dental work done, tell your dentist you are receiving this medicine.  Avoid taking products that contain aspirin, acetaminophen, ibuprofen, naproxen, or ketoprofen unless instructed by your doctor. These medicines may hide a fever.  Do not become pregnant while taking this medicine.   Do not breast-feed an infant while taking this medicine. What side effects may I notice from receiving this medicine? Side effects that you should report to your  doctor or health care professional as soon as possible: -allergic reactions like skin rash, itching or hives, swelling of the face, lips, or tongue -signs of infection - fever or chills, cough, sore throat, pain or difficulty passing urine -signs of decreased platelets or bleeding - bruising, pinpoint red spots on the skin, black, tarry stools, nosebleeds -signs of decreased red blood cells - unusually weak or tired, fainting spells, lightheadedness -breathing problems -changes in hearing -changes in vision -chest pain -high blood pressure -low blood counts - This drug may decrease the number of white blood cells, red blood cells and platelets. You may be at increased risk for infections and bleeding. -nausea and vomiting -pain, swelling, redness or irritation at the injection site -pain, tingling, numbness in the hands or feet -problems with balance, talking, walking -trouble passing urine or change in the amount of urine Side effects that usually do not require medical attention (report to your doctor or health care professional if they continue or are bothersome): -hair loss -loss of appetite -metallic taste in the mouth or changes in taste This list may not describe all possible side effects. Call your doctor for medical advice about side effects. You may report side effects to FDA at 1-800-FDA-1088. Where should I keep my medicine? This drug is given in a hospital or clinic and will not be stored at home. NOTE: This sheet is a summary. It may not cover all possible information. If you have questions about this medicine, talk to your doctor, pharmacist, or health care provider.    2016, Elsevier/Gold Standard. (2007-08-16 14:38:05)  Fluorouracil, 5-FU injection What is this medicine? FLUOROURACIL, 5-FU (flure oh YOOR a sil) is a chemotherapy drug. It slows the growth of cancer cells. This medicine is used to treat many types of cancer like breast cancer, colon or rectal cancer,  pancreatic cancer, and stomach cancer. This medicine may be used for other purposes; ask your health care provider or pharmacist if you have questions. What should I tell my health care provider before I take this medicine? They need to know if you have any of these conditions: -blood disorders -dihydropyrimidine dehydrogenase (DPD) deficiency -infection (especially a virus infection such as chickenpox, cold sores, or herpes) -kidney disease -liver disease -malnourished, poor nutrition -recent or ongoing radiation therapy -an unusual or allergic reaction to fluorouracil, other chemotherapy, other medicines, foods, dyes, or preservatives -pregnant or trying to get pregnant -breast-feeding How should I use this medicine? This drug is given as an infusion or injection into a vein. It is administered in a hospital or clinic by a specially trained health care professional. Talk to your pediatrician regarding the use of this medicine in children. Special care may be needed. Overdosage: If you think you have taken too much of this medicine contact a poison control center or emergency room at once. NOTE: This medicine is only for you. Do not share this medicine with others. What if I miss a dose? It is important not to miss your dose. Call your doctor or health care professional if you are unable to keep an appointment. What may interact with this medicine? -allopurinol -cimetidine -dapsone -digoxin -hydroxyurea -leucovorin -levamisole -medicines for seizures like ethotoin, fosphenytoin, phenytoin -medicines to increase blood counts like filgrastim, pegfilgrastim, sargramostim -medicines that treat or prevent blood clots like warfarin, enoxaparin, and dalteparin -methotrexate -  metronidazole -pyrimethamine -some other chemotherapy drugs like busulfan, cisplatin, estramustine, vinblastine -trimethoprim -trimetrexate -vaccines Talk to your doctor or health care professional before taking  any of these medicines: -acetaminophen -aspirin -ibuprofen -ketoprofen -naproxen This list may not describe all possible interactions. Give your health care provider a list of all the medicines, herbs, non-prescription drugs, or dietary supplements you use. Also tell them if you smoke, drink alcohol, or use illegal drugs. Some items may interact with your medicine. What should I watch for while using this medicine? Visit your doctor for checks on your progress. This drug may make you feel generally unwell. This is not uncommon, as chemotherapy can affect healthy cells as well as cancer cells. Report any side effects. Continue your course of treatment even though you feel ill unless your doctor tells you to stop. In some cases, you may be given additional medicines to help with side effects. Follow all directions for their use. Call your doctor or health care professional for advice if you get a fever, chills or sore throat, or other symptoms of a cold or flu. Do not treat yourself. This drug decreases your body's ability to fight infections. Try to avoid being around people who are sick. This medicine may increase your risk to bruise or bleed. Call your doctor or health care professional if you notice any unusual bleeding. Be careful brushing and flossing your teeth or using a toothpick because you may get an infection or bleed more easily. If you have any dental work done, tell your dentist you are receiving this medicine. Avoid taking products that contain aspirin, acetaminophen, ibuprofen, naproxen, or ketoprofen unless instructed by your doctor. These medicines may hide a fever. Do not become pregnant while taking this medicine. Women should inform their doctor if they wish to become pregnant or think they might be pregnant. There is a potential for serious side effects to an unborn child. Talk to your health care professional or pharmacist for more information. Do not breast-feed an infant while  taking this medicine. Men should inform their doctor if they wish to father a child. This medicine may lower sperm counts. Do not treat diarrhea with over the counter products. Contact your doctor if you have diarrhea that lasts more than 2 days or if it is severe and watery. This medicine can make you more sensitive to the sun. Keep out of the sun. If you cannot avoid being in the sun, wear protective clothing and use sunscreen. Do not use sun lamps or tanning beds/booths. What side effects may I notice from receiving this medicine? Side effects that you should report to your doctor or health care professional as soon as possible: -allergic reactions like skin rash, itching or hives, swelling of the face, lips, or tongue -low blood counts - this medicine may decrease the number of white blood cells, red blood cells and platelets. You may be at increased risk for infections and bleeding. -signs of infection - fever or chills, cough, sore throat, pain or difficulty passing urine -signs of decreased platelets or bleeding - bruising, pinpoint red spots on the skin, black, tarry stools, blood in the urine -signs of decreased red blood cells - unusually weak or tired, fainting spells, lightheadedness -breathing problems -changes in vision -chest pain -mouth sores -nausea and vomiting -pain, swelling, redness at site where injected -pain, tingling, numbness in the hands or feet -redness, swelling, or sores on hands or feet -stomach pain -unusual bleeding Side effects that usually do not require medical  attention (report to your doctor or health care professional if they continue or are bothersome): -changes in finger or toe nails -diarrhea -dry or itchy skin -hair loss -headache -loss of appetite -sensitivity of eyes to the light -stomach upset -unusually teary eyes This list may not describe all possible side effects. Call your doctor for medical advice about side effects. You may report  side effects to FDA at 1-800-FDA-1088. Where should I keep my medicine? This drug is given in a hospital or clinic and will not be stored at home. NOTE: This sheet is a summary. It may not cover all possible information. If you have questions about this medicine, talk to your doctor, pharmacist, or health care provider.    2016, Elsevier/Gold Standard. (2007-09-14 13:53:16)

## 2015-09-12 ENCOUNTER — Telehealth (HOSPITAL_COMMUNITY): Payer: Self-pay | Admitting: *Deleted

## 2015-09-12 NOTE — Telephone Encounter (Signed)
Message left for patient to call clinic with any issues/concerns reagarding infusion pump.

## 2015-09-13 ENCOUNTER — Ambulatory Visit (HOSPITAL_COMMUNITY): Payer: Self-pay

## 2015-09-13 ENCOUNTER — Encounter (HOSPITAL_BASED_OUTPATIENT_CLINIC_OR_DEPARTMENT_OTHER): Payer: BLUE CROSS/BLUE SHIELD

## 2015-09-13 VITALS — BP 132/75 | HR 71 | Temp 97.8°F | Resp 16

## 2015-09-13 DIAGNOSIS — E871 Hypo-osmolality and hyponatremia: Secondary | ICD-10-CM | POA: Diagnosis not present

## 2015-09-13 DIAGNOSIS — C099 Malignant neoplasm of tonsil, unspecified: Secondary | ICD-10-CM | POA: Diagnosis not present

## 2015-09-13 MED ORDER — SODIUM CHLORIDE 0.9 % IV SOLN
INTRAVENOUS | Status: AC
  Administered 2015-09-13: 10:00:00 via INTRAVENOUS

## 2015-09-13 NOTE — Progress Notes (Signed)
Denies any complaints with 26fu continuous infusion pump.  Tolerated IV fluids well. Ambulatory on discharge home with sister. Continuous infusion pump intact and infusing.

## 2015-09-13 NOTE — Patient Instructions (Addendum)
[place] at [facility] Discharge Instructions  RECOMMENDATIONS MADE BY THE CONSULTANT AND ANY TEST RESULTS WILL BE SENT TO YOUR REFERRING PHYSICIAN.  IV fluids given today as ordered. Return as scheduled on Sunday to have continuous infusion pump disconnected.  Frank Castillo chemo medications:   Carboplatin - this medication can be hard on your kidneys - this is why we need you to drinking 64 oz of fluid (preferably water/decaff fluids) 2 days prior to chemo and for up to 4-5 days after chemo. Drink more if you can. This will help to keep your kidneys flushed. This can cause mild hair loss, lower your platelets (which keep you from bleeding out when you cut yourself), lower your white blood cells (fight infection), and cause nausea/vomiting. (only takes 30 minutes to infuse)   5FU: bone marrow suppression (low white blood cells - wbcs fight infection, low red blood cells - rbcs make up your blood, low platelets - this is what makes your blood clot, nausea/vomiting, diarrhea, mouth sores, hair loss, dry skin, ocular toxicities (increased tear production, sensitivity to light). You must wear sunscreen/sunglasses. Cover your skin when out in sunlight. You will get burned very easily. The nurse will connect you to an ambulatory pump with this medication attached. You will wear the pump for 96 hours. The 5FU chemo will infuse for 96 hours total. Then the pump will be removed).   You will do this once every 21 days x 2 more cycles. Then you will be done.   Continue to come for IV fluids as scheduled.     Thank you for choosing [place] at [facility] to provide your oncology and hematology care.  To afford each patient quality time with our provider, please arrive at least 15 minutes before your scheduled appointment time.   Beginning January 23rd 2017 lab work for the [facility] will be done in the  Main lab at [facility] on 1st floor. If you have a lab  appointment with the [facility] please come in thru the  Main Entrance and check in at the main information desk  You need to re-schedule your appointment should you arrive 10 or more minutes late.  We strive to give you quality time with our providers, and arriving late affects you and other patients whose appointments are after yours.  Also, if you no show three or more times for appointments you may be dismissed from the clinic at the providers discretion.     Again, thank you for choosing [facility].  Our hope is that these requests will decrease the amount of time that you wait before being seen by our physicians.       _____________________________________________________________  Should you have questions after your visit to [facility], please contact our office at [phone] between the hours of 8:30 a.m. and 4:30 p.m.  Voicemails left after 4:30 p.m. will not be returned until the following business day.  For prescription refill requests, have your pharmacy contact our office.         Resources For Cancer Patients and their Caregivers ? American Cancer Society: Can assist with transportation, wigs, general needs, runs Look Good Feel Better.        (951) 283-5489 ? Cancer Care: Provides financial assistance, online support groups, medication/co-pay assistance.  1-800-813-HOPE 4375884949) ? [place] Assists [place] cancer patients and their families through emotional , educational and financial support.  (734)652-2392 ? [place] DSS Where to apply for food stamps, Medicaid and utility assistance. 609-366-9642 ? [facility]: Transportation to medical appointments. 818-628-0259 ? Social Security Administration: May apply for disability if have a Stage IV cancer. 920-500-4889 573-610-3962 ? [employer], Disability and Transit Services: Assists with nutrition, care and transit needs. [phone]

## 2015-09-15 ENCOUNTER — Encounter (HOSPITAL_COMMUNITY): Payer: Self-pay

## 2015-09-15 ENCOUNTER — Encounter (HOSPITAL_BASED_OUTPATIENT_CLINIC_OR_DEPARTMENT_OTHER): Payer: BLUE CROSS/BLUE SHIELD

## 2015-09-15 VITALS — BP 124/74 | HR 91 | Temp 98.6°F | Resp 16

## 2015-09-15 DIAGNOSIS — C099 Malignant neoplasm of tonsil, unspecified: Secondary | ICD-10-CM

## 2015-09-15 DIAGNOSIS — Z452 Encounter for adjustment and management of vascular access device: Secondary | ICD-10-CM

## 2015-09-15 MED ORDER — SODIUM CHLORIDE 0.9% FLUSH
10.0000 mL | INTRAVENOUS | Status: DC | PRN
  Administered 2015-09-15: 10 mL
  Filled 2015-09-15: qty 10

## 2015-09-15 MED ORDER — HEPARIN SOD (PORK) LOCK FLUSH 100 UNIT/ML IV SOLN
500.0000 [IU] | Freq: Once | INTRAVENOUS | Status: AC | PRN
  Administered 2015-09-15: 500 [IU]
  Filled 2015-09-15: qty 5

## 2015-09-15 NOTE — Progress Notes (Signed)
Frank Castillo returns today for port de access and flush after 96 hr continous infusion of 63fu. Tolerated infusion without problems. Noted increased redness/burning to radiation area over last few days with reported peeling. No blisters or open areas noted today.  Portacath located rt chest wall was  deaccessed and flushed with 62ml NS and 500U/75ml Heparin and needle removed intact.  Procedure without incident. Patient tolerated procedure well.

## 2015-09-16 ENCOUNTER — Ambulatory Visit (HOSPITAL_COMMUNITY): Payer: Self-pay

## 2015-09-16 ENCOUNTER — Other Ambulatory Visit (HOSPITAL_COMMUNITY): Payer: Self-pay | Admitting: Oncology

## 2015-09-16 ENCOUNTER — Encounter (HOSPITAL_BASED_OUTPATIENT_CLINIC_OR_DEPARTMENT_OTHER): Payer: BLUE CROSS/BLUE SHIELD

## 2015-09-16 VITALS — BP 121/90 | HR 98 | Temp 98.0°F | Resp 16

## 2015-09-16 DIAGNOSIS — C090 Malignant neoplasm of tonsillar fossa: Secondary | ICD-10-CM

## 2015-09-16 DIAGNOSIS — E871 Hypo-osmolality and hyponatremia: Secondary | ICD-10-CM | POA: Diagnosis not present

## 2015-09-16 DIAGNOSIS — C099 Malignant neoplasm of tonsil, unspecified: Secondary | ICD-10-CM | POA: Diagnosis not present

## 2015-09-16 MED ORDER — HEPARIN SOD (PORK) LOCK FLUSH 100 UNIT/ML IV SOLN
500.0000 [IU] | Freq: Once | INTRAVENOUS | Status: AC
  Administered 2015-09-16: 500 [IU] via INTRAVENOUS

## 2015-09-16 MED ORDER — SODIUM CHLORIDE 0.9 % IV SOLN
Freq: Once | INTRAVENOUS | Status: AC
  Administered 2015-09-16: 10:00:00 via INTRAVENOUS

## 2015-09-16 MED ORDER — TRAMADOL HCL 50 MG PO TABS: 50.0000 mg | ORAL_TABLET | Freq: Four times a day (QID) | ORAL | Status: DC | PRN

## 2015-09-16 MED ORDER — HEPARIN SOD (PORK) LOCK FLUSH 100 UNIT/ML IV SOLN
INTRAVENOUS | Status: AC
  Filled 2015-09-16: qty 5

## 2015-09-16 NOTE — Progress Notes (Signed)
Patient tolerated infusion well.  VSS.   

## 2015-09-16 NOTE — Progress Notes (Signed)
No primary care provider on file. No primary provider on file.  Cancer of tonsillar fossa (HCC)  Oral candidiasis - Plan: fluconazole (DIFLUCAN) 10 MG/ML suspension, fluconazole (DIFLUCAN) IVPB 100 mg, DISCONTINUED: fluconazole (DIFLUCAN) 123XX123 MG tablet  Folliculitis - Plan: mupirocin ointment (BACTROBAN) 2 %  CURRENT THERAPY: Concomitant chemoradiation beginning on 08/20/2015 consisting of Cisplatin every 21 days complicated by Cisplatin-induced tinnitus following cycle 1 of treatment leading to a change in therapy to   INTERVAL HISTORY: Frank Castillo 65 y.o. 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    Cancer of tonsillar fossa ([place])   08/02/2015 Initial Diagnosis Cancer of tonsillar fossa ([place])   08/20/2015 Procedure Port-a-cath placed by IR   08/20/2015 Procedure G-tube placed by IR   08/21/2015 -  Radiation Therapy Dr. Isidore Moos   08/21/2015 - 09/10/2015 Chemotherapy Cisplatin every 21 days with XRT.   08/28/2015 Adverse Reaction Tinnitis, Cisplatin-induced.   09/11/2015 -  Chemotherapy Carboplatin/5FU    I personally reviewed and went over laboratory results with the patient.  The results are noted within this dictation.  Labs from 09/11/2015 are noted.  He reports increasing oral secretions and sores/blisters in mouth. Digital exam is impressive for oral candidiasis. He notes that he's been using Magic mouthwash without any significant changes. On exam, his thrush has progressed and is worsened.  He also notes redness with the sensation of burning of neck. He denies any pruritus. He denies any desquamation.  He notes a rash on the inner aspects of his upper thighs.  I suspect this is a folliculitis.  We'll long discussion regarding his history of illicit drug abuse and alcohol abuse. 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 With a nice conversation regarding Alcoholics Anonymous. I  suspect this history causing his resistance to opiate pain medicine.    Past Medical History  Diagnosis Date  . Tonsillar mass     left  . Hypertension   . Cirrhosis ([place])   . Portal hypertension (HCC)     has Squamous cell carcinoma of left tonsil (HCC) and Cancer of tonsillar fossa (HCC) on his problem list.     is allergic to bee venom.  Current Outpatient Prescriptions on File Prior to Visit  Medication Sig Dispense Refill  . CARBOPLATIN IV Inject into the vein. Once every 21 days x 3 cycles    . cetirizine (ZYRTEC) 10 MG tablet Take 10 mg by mouth daily.    . cholecalciferol (VITAMIN D) 1000 units tablet Take 1,000 Units by mouth daily.    . Diphenhyd-Hydrocort-Nystatin (FIRST-DUKES MOUTHWASH) SUSP Use as directed 5 mLs in the mouth or throat 4 (four) times daily -  before meals and at bedtime. 240 mL 1  . fluorouracil CALGB 82956 in sodium chloride 0.9 % 150 mL Inject into the vein over 96 hr. X 3 cycles    . guaiFENesin (ROBITUSSIN) 100 MG/5ML SOLN Take 5 mLs (100 mg total) by mouth every 6 (six) hours as needed for cough or to loosen phlegm. 240 mL 1  . lidocaine-prilocaine (EMLA) cream Apply a quarter size amount to port site 1 hour prior to chemo. Do not rub in. Cover with plastic wrap. 30 g 3  . lisinopril-hydrochlorothiazide (PRINZIDE,ZESTORETIC) 20-25 MG tablet Take 1 tablet by mouth daily.  11  . Melatonin 10 MG TABS Take by mouth.    . nicotine (NICODERM CQ) 14 mg/24hr patch apply 21  mg patch daily x6wk, then 14 mg patch daily x2wk, then 7 mg patch daily x2wk 14 patch 0  . nicotine (NICODERM CQ) 21 mg/24hr patch apply 21 mg patch daily x6wk, then 14 mg patch daily x2wk, then 7 mg patch daily x2wk 21 patch 1  . nicotine (NICODERM CQ) 7 mg/24hr patch apply 21 mg patch daily x6wk, then 14 mg patch daily x2wk, then 7 mg patch daily x2wk 14 patch 0  . Nutritional Supplements (JEVITY 1.2 CAL/FIBER) LIQD 8 cans/day.  4 feeding of 2 cans each. Flush with 100 mls before and after  each feeding 1000 mL 6  . ondansetron (ZOFRAN ODT) 8 MG disintegrating tablet Take 1 tablet (8 mg total) by mouth every 8 (eight) hours as needed for nausea or vomiting. 30 tablet 2  . oxyCODONE (ROXICODONE) 5 MG/5ML solution Take 5-10 mLs (5-10 mg total) by mouth every 4 (four) hours as needed for severe pain. 250 mL 0  . oxyCODONE-acetaminophen (ROXICET) 5-325 MG tablet Take 1-2 tablets by mouth every 4 (four) hours as needed for severe pain. 30 tablet 0  . pantoprazole (PROTONIX) 40 MG tablet Take 1 tablet (40 mg total) by mouth daily. 30 tablet 3  . prochlorperazine (COMPAZINE) 10 MG tablet Take 1 tablet (10 mg total) by mouth every 6 (six) hours as needed for nausea or vomiting. 30 tablet 2  . sodium fluoride (FLUORISHIELD) 1.1 % GEL dental gel Instill one drop of gel per tooth space of fluoride tray. Place over teeth for 5 minutes. Remove. Spit out excess. Repeat nightly. 120 mL prn  . traMADol (ULTRAM) 50 MG tablet Take 1 tablet (50 mg total) by mouth every 6 (six) hours as needed. 60 tablet 1   No current facility-administered medications on file prior to visit.    Past Surgical History  Procedure Laterality Date  . Hernia repair Right   . Mouth surgery    . Tonsillectomy Left 07/09/2015    Procedure: LEFT TONSILLECTOMY;  Surgeon: Leta Baptist, MD;  Location: [facility];  Service: ENT;  Laterality: Left;    Denies any headaches, dizziness, double vision, fevers, chills, night sweats, , vomiting, diarrhea, constipation, chest pain, heart palpitations, shortness of breath, blood in stool, black tarry stool, urinary pain, urinary burning, urinary frequency, hematuria.   PHYSICAL EXAMINATION  ECOG PERFORMANCE STATUS: 1 - Symptomatic but completely ambulatory  Filed Vitals:   09/18/15 0900  BP: 95/66  Pulse: 93  Temp: 98.9 F (37.2 C)  Resp: 18    GENERAL:alert, no distress, well nourished, well developed, comfortable, cooperative, smiling and Sitting in a chemotherapy  recliner receiving IV fluids, accompanied by his oldest sister SKIN: skin color, texture, turgor are normal, no rashes or significant lesions.  Erythema bilateral neck. Picture below:       HEAD: Normocephalic, No masses, lesions, tenderness or abnormalities EYES: normal, EOMI, Conjunctiva are pink and non-injected EARS: External ears normal OROPHARYNX:lips, buccal mucosa, and tongue normal, mucous membranes are moist and significant thrush noted with white plaques on buccal mucosa.     NECK: supple, trachea midline LYMPH:  not examined BREAST:not examined LUNGS: clear to auscultation and percussion bilaterally without wheezes, rales, or rhonchi. HEART: regular rate & rhythm, no murmurs, no gallops, S1 normal and S2 normal ABDOMEN:abdomen soft, non-tender and normal bowel sounds BACK: Back symmetric, no curvature., No CVA tenderness EXTREMITIES:less then 2 second capillary refill, no joint deformities, effusion, or inflammation, no skin discoloration, no cyanosis  NEURO: alert & oriented x 3  with fluent speech, no focal motor/sensory deficits, gait normal    LABORATORY DATA: CBC    Component Value Date/Time   WBC 7.3 09/11/2015 0900   WBC 9.0 10/21/2011 1614   RBC 4.04* 09/11/2015 0900   RBC 3.42* 10/21/2011 1614   HGB 11.9* 09/11/2015 0900   HGB 11.5* 10/21/2011 1614   HCT 34.6* 09/11/2015 0900   HCT 34.6* 10/21/2011 1614   PLT 246 09/11/2015 0900   PLT 209 10/21/2011 1614   MCV 85.6 09/11/2015 0900   MCV 101* 10/21/2011 1614   MCH 29.5 09/11/2015 0900   MCH 33.7 10/21/2011 1614   MCHC 34.4 09/11/2015 0900   MCHC 33.3 10/21/2011 1614   RDW 15.4 09/11/2015 0900   RDW 14.0 10/21/2011 1614   LYMPHSABS 1.1 09/11/2015 0900   LYMPHSABS 1.9 10/20/2011 0609   MONOABS 0.9 09/11/2015 0900   MONOABS 1.1* 10/20/2011 0609   EOSABS 0.1 09/11/2015 0900   EOSABS 0.1 10/20/2011 0609   BASOSABS 0.1 09/11/2015 0900   BASOSABS 0.1 10/20/2011 0609      Chemistry        Component Value Date/Time   NA 127* 09/11/2015 0900   NA 132* 10/21/2011 1614   K 4.1 09/11/2015 0900   K 3.6 10/21/2011 1614   CL 93* 09/11/2015 0900   CL 92* 10/21/2011 1614   CO2 28 09/11/2015 0900   CO2 32 10/21/2011 1614   BUN 17 09/11/2015 0900   BUN 7 10/21/2011 1614   CREATININE 0.78 09/11/2015 0900   CREATININE 0.73 10/21/2011 1614      Component Value Date/Time   CALCIUM 9.0 09/11/2015 0900   CALCIUM 7.9* 10/21/2011 1614   ALKPHOS 50 09/11/2015 0900   ALKPHOS 83 10/21/2011 1614   AST 14* 09/11/2015 0900   AST 97* 10/21/2011 1614   ALT 14* 09/11/2015 0900   ALT 34 10/21/2011 1614   BILITOT 0.2* 09/11/2015 0900   BILITOT 1.1* 10/21/2011 1614        PENDING LABS:   RADIOGRAPHIC STUDIES:  Ir Gastrostomy Tube Mod Sed  08/20/2015  INDICATION: 65 year old with squamous cell carcinoma of the left tonsil. Port-A-Cath was placed immediately prior to placement of the gastrostomy tube. EXAM: PERCUTANEOUS GASTROSTOMY TUBE WITH FLUOROSCOPIC GUIDANCE Physician: Stephan Minister. Henn, MD MEDICATIONS: Ancef 2 g was given prior to placement of the Port-A-Cath. Glucagon 1 mg IV ANESTHESIA/SEDATION: Versed 2.0 mg IV; Fentanyl 25 mcg IV Moderate Sedation Time:  11 minutes The patient was continuously monitored during the procedure by the interventional radiology nurse under my direct supervision. FLUOROSCOPY TIME:  Fluoroscopy Time:  1 minutes, 24 seconds.  22 mGy CONTRAST:  5 mL Omnipaque [id number] COMPLICATIONS: None immediate. PROCEDURE: Informed consent was obtained for a percutaneous gastrostomy tube. The patient was placed on the interventional table. Fluoroscopy demonstrated oral contrast in the transverse colon. An orogastric tube was placed with fluoroscopic guidance. The anterior abdomen was prepped and draped in sterile fashion. Maximal barrier sterile technique was utilized including caps, mask, sterile gowns, sterile gloves, sterile drape, hand hygiene and skin antiseptic. Stomach was  inflated with air through the orogastric tube. The skin and subcutaneous tissues were anesthetized with 1% lidocaine. A 17 gauge needle was directed into the distended stomach with fluoroscopic guidance. A wire was advanced into the stomach and a T-tact was deployed. A 9-French vascular sheath was placed and the orogastric tube was snared using a [place] snare device. The orogastric tube and snare were pulled out of the patient's mouth. The snare device was  connected to a 20-French gastrostomy tube. The snare device and gastrostomy tube were pulled through the patient's mouth and out the anterior abdominal wall. The gastrostomy tube was cut to an appropriate length. Contrast injection through gastrostomy tube confirmed placement within the stomach. Fluoroscopic images were obtained for documentation. The gastrostomy tube was flushed with normal saline. IMPRESSION: Successful fluoroscopic guided percutaneous gastrostomy tube placement. Electronically Signed   By: Markus Daft M.D.   On: 08/20/2015 16:39   Ir Fluoro Guide Cv Line Right  08/20/2015  INDICATION: 65 year old with squamous cell carcinoma of the left tonsil. Patient scheduled for Port-A-Cath placement and gastrostomy tube placement. Plan for Port-A-Cath placement prior to gastrostomy tube. EXAM: FLUOROSCOPIC AND ULTRASOUND GUIDED PLACEMENT OF A SUBCUTANEOUS PORT COMPARISON:  None. MEDICATIONS: Ancef 2 g; The antibiotic was administered within an appropriate time interval prior to skin puncture. ANESTHESIA/SEDATION: Versed 4.0 mg IV; Fentanyl 100 mcg IV; Moderate Sedation Time:  25 minutes The patient was continuously monitored during the procedure by the interventional radiology nurse under my direct supervision. FLUOROSCOPY TIME:  12 seconds, 3 mGy COMPLICATIONS: None immediate. PROCEDURE: The procedure, risks, benefits, and alternatives were explained to the patient. Questions regarding the procedure were encouraged and answered. The patient  understands and consents to the procedure. Patient was placed supine on the interventional table. Ultrasound confirmed a patent right internal jugular vein. The right chest and neck were cleaned with a skin antiseptic and a sterile drape was placed. Maximal barrier sterile technique was utilized including caps, mask, sterile gowns, sterile gloves, sterile drape, hand hygiene and skin antiseptic. The right neck was anesthetized with 1% lidocaine. Small incision was made in the right neck with a blade. Micropuncture set was placed in the right internal jugular vein with ultrasound guidance. The micropuncture wire was used for measurement purposes. The right chest was anesthetized with 1% lidocaine with epinephrine. #15 blade was used to make an incision and a subcutaneous port pocket was formed. [place] was assembled. Subcutaneous tunnel was formed with a stiff tunneling device. The port catheter was brought through the subcutaneous tunnel. The port was placed in the subcutaneous pocket. The micropuncture set was exchanged for a peel-away sheath. The catheter was placed through the peel-away sheath and the tip was positioned at the superior cavoatrial junction. Catheter placement was confirmed with fluoroscopy. The port was accessed and flushed with heparinized saline. The port pocket was closed using two layers of absorbable sutures and Dermabond. The vein skin site was closed using a single layer of absorbable suture and Dermabond. Sterile dressings were applied. Patient tolerated the procedure well without an immediate complication. Ultrasound and fluoroscopic images were taken and saved for this procedure. IMPRESSION: Placement of a subcutaneous port device. Catheter tip at the superior cavoatrial junction. Electronically Signed   By: Markus Daft M.D.   On: 08/20/2015 16:37   Ir US Guide Vasc Access Right  08/20/2015  INDICATION: 65 year old with squamous cell carcinoma of the left tonsil. Patient  scheduled for Port-A-Cath placement and gastrostomy tube placement. Plan for Port-A-Cath placement prior to gastrostomy tube. EXAM: FLUOROSCOPIC AND ULTRASOUND GUIDED PLACEMENT OF A SUBCUTANEOUS PORT COMPARISON:  None. MEDICATIONS: Ancef 2 g; The antibiotic was administered within an appropriate time interval prior to skin puncture. ANESTHESIA/SEDATION: Versed 4.0 mg IV; Fentanyl 100 mcg IV; Moderate Sedation Time:  25 minutes The patient was continuously monitored during the procedure by the interventional radiology nurse under my direct supervision. FLUOROSCOPY TIME:  12 seconds, 3 mGy COMPLICATIONS: None  immediate. PROCEDURE: The procedure, risks, benefits, and alternatives were explained to the patient. Questions regarding the procedure were encouraged and answered. The patient understands and consents to the procedure. Patient was placed supine on the interventional table. Ultrasound confirmed a patent right internal jugular vein. The right chest and neck were cleaned with a skin antiseptic and a sterile drape was placed. Maximal barrier sterile technique was utilized including caps, mask, sterile gowns, sterile gloves, sterile drape, hand hygiene and skin antiseptic. The right neck was anesthetized with 1% lidocaine. Small incision was made in the right neck with a blade. Micropuncture set was placed in the right internal jugular vein with ultrasound guidance. The micropuncture wire was used for measurement purposes. The right chest was anesthetized with 1% lidocaine with epinephrine. #15 blade was used to make an incision and a subcutaneous port pocket was formed. [place] was assembled. Subcutaneous tunnel was formed with a stiff tunneling device. The port catheter was brought through the subcutaneous tunnel. The port was placed in the subcutaneous pocket. The micropuncture set was exchanged for a peel-away sheath. The catheter was placed through the peel-away sheath and the tip was positioned at  the superior cavoatrial junction. Catheter placement was confirmed with fluoroscopy. The port was accessed and flushed with heparinized saline. The port pocket was closed using two layers of absorbable sutures and Dermabond. The vein skin site was closed using a single layer of absorbable suture and Dermabond. Sterile dressings were applied. Patient tolerated the procedure well without an immediate complication. Ultrasound and fluoroscopic images were taken and saved for this procedure. IMPRESSION: Placement of a subcutaneous port device. Catheter tip at the superior cavoatrial junction. Electronically Signed   By: Markus Daft M.D.   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  Oncology history is updated.  Will continue with IV fluids three times weekly.  Physical exam is significant for oral candidiasis. Orders place for 100 mg of IV Diflucan today in the clinic. I described liquid Diflucan as well to take 100 mg daily. He is to continue with Magic mouthwash swish and swallow 4 times daily.  We'll need to consider 5-FU dose reduction in the future depending on response to above-mentioned treatment for oral candidiasis.  Physical exam is also significant for bilateral medial aspect of upper thigh folliculitis. Mupirocin is escribed to pharmacy.  We discussed his pain medicine needs and he continues to hold off on utilizing opiates. "I know there will come a time where I will need them" but for now, he would like to hold off. We discussed his past history of illicit drug use. I learned a lot about his drug use history today. I am positive this is playing a role in his resistance to pain medication (once an addict always an  addict).  He'll return in approximately 1 week for short interval follow-up.   He'll return as scheduled on 5/8 for follow-up as well.   THERAPY PLAN:  Will continue treatment as planned in a curative fashion.  Will continue to manage side effects of treatment.  Will need to consider dose reduction of 5FU in the future.  All questions were answered. The patient knows to call the clinic with any problems, questions or concerns. We can certainly  see the patient much sooner if necessary.  Patient and plan discussed with Dr. Ancil Linsey and she is in agreement with the aforementioned.   This note is electronically signed by: Doy Mince 09/18/2015 12:59 PM

## 2015-09-16 NOTE — Assessment & Plan Note (Addendum)
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  Oncology history is updated.  Will continue with IV fluids three times weekly.  Physical exam is significant for oral candidiasis. Orders place for 100 mg of IV Diflucan today in the clinic. I described liquid Diflucan as well to take 100 mg daily. He is to continue with Magic mouthwash swish and swallow 4 times daily.  We'll need to consider 5-FU dose reduction in the future depending on response to above-mentioned treatment for oral candidiasis.  Physical exam is also significant for bilateral medial aspect of upper thigh folliculitis. Mupirocin is escribed to pharmacy.  We discussed his pain medicine needs and he continues to hold off on utilizing opiates. "I know there will come a time where I will need them" but for now, he would like to hold off. We discussed his past history of illicit drug use. I learned a lot about his drug use history today. I am positive this is playing a role in his resistance to pain medication (once an addict always an addict).  He'll return in approximately 1 week for short interval follow-up.   He'll return as scheduled on 5/8 for follow-up as well.

## 2015-09-16 NOTE — Patient Instructions (Signed)
[place] at [facility] Discharge Instructions  RECOMMENDATIONS MADE BY THE CONSULTANT AND ANY TEST RESULTS WILL BE SENT TO YOUR REFERRING PHYSICIAN.  IV fluids today.   We have called in a pain medication to your pharmacy called tramadol.    Thank you for choosing [place] at [facility] to provide your oncology and hematology care.  To afford each patient quality time with our provider, please arrive at least 15 minutes before your scheduled appointment time.   Beginning January 23rd 2017 lab work for the [facility] will be done in the  Main lab at [facility] on 1st floor. If you have a lab appointment with the [facility] please come in thru the  Main Entrance and check in at the main information desk  You need to re-schedule your appointment should you arrive 10 or more minutes late.  We strive to give you quality time with our providers, and arriving late affects you and other patients whose appointments are after yours.  Also, if you no show three or more times for appointments you may be dismissed from the clinic at the providers discretion.     Again, thank you for choosing [facility].  Our hope is that these requests will decrease the amount of time that you wait before being seen by our physicians.       _____________________________________________________________  Should you have questions after your visit to [facility], please contact our office at [phone] between the hours of 8:30 a.m. and 4:30 p.m.  Voicemails left after 4:30 p.m. will not be returned until the following business day.  For prescription refill requests, have your pharmacy contact our office.         Resources For Cancer Patients and their Caregivers ? American Cancer Society: Can assist with transportation, wigs, general needs, runs Look Good Feel Better.        667-119-6524 ? Cancer Care: Provides financial  assistance, online support groups, medication/co-pay assistance.  1-800-813-HOPE (918)565-3292) ? [place] Assists [place] cancer patients and their families through emotional , educational and financial support.  423 782 2931 ? [place] DSS Where to apply for food stamps, Medicaid and utility assistance. 321-280-6759 ? [facility]: Transportation to medical appointments. (361) 742-3490 ? Social Security Administration: May apply for disability if have a Stage IV cancer. 321-153-3456 [phone]) 325-2790 ? [employer], Disability and Transit Services: Assists with nutrition, care and transit needs. 919-542-7570

## 2015-09-18 ENCOUNTER — Encounter (HOSPITAL_BASED_OUTPATIENT_CLINIC_OR_DEPARTMENT_OTHER): Payer: BLUE CROSS/BLUE SHIELD

## 2015-09-18 ENCOUNTER — Encounter (HOSPITAL_BASED_OUTPATIENT_CLINIC_OR_DEPARTMENT_OTHER): Payer: BLUE CROSS/BLUE SHIELD | Admitting: Oncology

## 2015-09-18 ENCOUNTER — Ambulatory Visit (HOSPITAL_COMMUNITY): Payer: Self-pay

## 2015-09-18 VITALS — BP 95/66 | HR 93 | Temp 98.9°F | Resp 18 | Wt 172.2 lb

## 2015-09-18 DIAGNOSIS — B370 Candidal stomatitis: Secondary | ICD-10-CM | POA: Diagnosis not present

## 2015-09-18 DIAGNOSIS — C090 Malignant neoplasm of tonsillar fossa: Secondary | ICD-10-CM

## 2015-09-18 DIAGNOSIS — L739 Follicular disorder, unspecified: Secondary | ICD-10-CM | POA: Diagnosis not present

## 2015-09-18 DIAGNOSIS — C099 Malignant neoplasm of tonsil, unspecified: Secondary | ICD-10-CM

## 2015-09-18 MED ORDER — FLUCONAZOLE 10 MG/ML PO SUSR: 100.0000 mg | Freq: Every day | ORAL | Status: DC

## 2015-09-18 MED ORDER — HEPARIN SOD (PORK) LOCK FLUSH 100 UNIT/ML IV SOLN
500.0000 [IU] | Freq: Once | INTRAVENOUS | Status: AC
  Administered 2015-09-18: 500 [IU] via INTRAVENOUS

## 2015-09-18 MED ORDER — SODIUM CHLORIDE 0.9% FLUSH: 10.0000 mL | Freq: Once | INTRAVENOUS | Status: DC

## 2015-09-18 MED ORDER — FLUCONAZOLE 100 MG PO TABS: ORAL_TABLET | ORAL | Status: DC

## 2015-09-18 MED ORDER — HEPARIN SOD (PORK) LOCK FLUSH 100 UNIT/ML IV SOLN
INTRAVENOUS | Status: AC
  Filled 2015-09-18: qty 5

## 2015-09-18 MED ORDER — SODIUM CHLORIDE 0.9 % IV SOLN
INTRAVENOUS | Status: DC
  Administered 2015-09-18: 09:00:00 via INTRAVENOUS

## 2015-09-18 MED ORDER — MUPIROCIN 2 % EX OINT: 1.0000 "application " | TOPICAL_OINTMENT | Freq: Two times a day (BID) | CUTANEOUS | Status: DC

## 2015-09-18 MED ORDER — FLUCONAZOLE IN SODIUM CHLORIDE 400-0.9 MG/200ML-% IV SOLN
100.0000 mg | Freq: Once | INTRAVENOUS | Status: AC
  Administered 2015-09-18: 100 mg via INTRAVENOUS
  Filled 2015-09-18: qty 50

## 2015-09-18 NOTE — Progress Notes (Signed)
Patient tolerated infusion well.  VSS.   

## 2015-09-18 NOTE — Patient Instructions (Signed)
[place] at [facility] Discharge Instructions  RECOMMENDATIONS MADE BY THE CONSULTANT AND ANY TEST RESULTS WILL BE SENT TO YOUR REFERRING PHYSICIAN.  Tom sent prescription for bactroban and diflucan to your drugstore IV diflucan today while here Continue the Nystatin swish and swallow Return in about a week to see PA Dr. Whitney Muse as scheduled 5/8  Thank you for choosing [place] at [facility] to provide your oncology and hematology care.  To afford each patient quality time with our provider, please arrive at least 15 minutes before your scheduled appointment time.   Beginning January 23rd 2017 lab work for the [facility] will be done in the  Main lab at [facility] on 1st floor. If you have a lab appointment with the [facility] please come in thru the  Main Entrance and check in at the main information desk  You need to re-schedule your appointment should you arrive 10 or more minutes late.  We strive to give you quality time with our providers, and arriving late affects you and other patients whose appointments are after yours.  Also, if you no show three or more times for appointments you may be dismissed from the clinic at the providers discretion.     Again, thank you for choosing [facility].  Our hope is that these requests will decrease the amount of time that you wait before being seen by our physicians.       _____________________________________________________________  Should you have questions after your visit to [facility], please contact our office at [phone] between the hours of 8:30 a.m. and 4:30 p.m.  Voicemails left after 4:30 p.m. will not be returned until the following business day.  For prescription refill requests, have your pharmacy contact our office.         Resources For Cancer Patients and their Caregivers ? American Cancer Society: Can assist with transportation,  wigs, general needs, runs Look Good Feel Better.        662-172-0030 ? Cancer Care: Provides financial assistance, online support groups, medication/co-pay assistance.  1-800-813-HOPE (319)151-6327) ? [facility]ists [place] cancer patients and their families through emotional , educational and financial support.  214-667-2824 ? [place] DSS Where to apply for food stamps, Medicaid and utility assistance. 619-455-6015 ? [facility]: Transportation to medical appointments. 720 543 3857 ? Social Security Administration: May apply for disability if have a Stage IV cancer. 904-238-7358 402-162-7352 ? [employer], Disability and Transit Services: Assists with nutrition, care and transit needs. ([phone])699-3929

## 2015-09-18 NOTE — Patient Instructions (Signed)
[place] at [facility] Discharge Instructions  RECOMMENDATIONS MADE BY THE CONSULTANT AND ANY TEST RESULTS WILL BE SENT TO YOUR REFERRING PHYSICIAN.  IV fluids today.    Thank you for choosing [place] at [facility] to provide your oncology and hematology care.  To afford each patient quality time with our provider, please arrive at least 15 minutes before your scheduled appointment time.   Beginning January 23rd 2017 lab work for the [facility] will be done in the  Main lab at [facility] on 1st floor. If you have a lab appointment with the [facility] please come in thru the  Main Entrance and check in at the main information desk  You need to re-schedule your appointment should you arrive 10 or more minutes late.  We strive to give you quality time with our providers, and arriving late affects you and other patients whose appointments are after yours.  Also, if you no show three or more times for appointments you may be dismissed from the clinic at the providers discretion.     Again, thank you for choosing [facility].  Our hope is that these requests will decrease the amount of time that you wait before being seen by our physicians.       _____________________________________________________________  Should you have questions after your visit to [facility], please contact our office at [phone] between the hours of 8:30 a.m. and 4:30 p.m.  Voicemails left after 4:30 p.m. will not be returned until the following business day.  For prescription refill requests, have your pharmacy contact our office.         Resources For Cancer Patients and their Caregivers ? American Cancer Society: Can assist with transportation, wigs, general needs, runs Look Good Feel Better.        2185964071 ? Cancer Care: Provides financial assistance, online support groups, medication/co-pay assistance.   1-800-813-HOPE 419-073-6195) ? [place]ists [place] cancer patients and their families through emotional , educational and financial support.  951-565-5427 ? [place] DSS Where to apply for food stamps, Medicaid and utility assistance. 518-135-0599 ? [employer]: Transportation to medical appointments. 778-268-5032 ? Social Security Administration: May apply for disability if have a Stage IV cancer. (240) 565-4124 (401)004-0921 ? [employer], Disability and Transit Services: Assists with nutrition, care and transit needs. ([phone]

## 2015-09-20 ENCOUNTER — Encounter (HOSPITAL_COMMUNITY): Payer: Self-pay | Admitting: Family Medicine

## 2015-09-20 ENCOUNTER — Other Ambulatory Visit (HOSPITAL_COMMUNITY): Payer: Self-pay | Admitting: Oncology

## 2015-09-20 ENCOUNTER — Encounter (HOSPITAL_BASED_OUTPATIENT_CLINIC_OR_DEPARTMENT_OTHER): Payer: BLUE CROSS/BLUE SHIELD

## 2015-09-20 ENCOUNTER — Observation Stay (HOSPITAL_COMMUNITY)
Admission: AD | Admit: 2015-09-20 | Discharge: 2015-09-21 | Disposition: A | Discharge status: Home / Self Care | Payer: BLUE CROSS/BLUE SHIELD | Source: Ambulatory Visit | Attending: Internal Medicine | Admitting: Internal Medicine

## 2015-09-20 VITALS — BP 123/71 | HR 90 | Temp 97.8°F | Resp 18 | Wt 172.4 lb

## 2015-09-20 DIAGNOSIS — K766 Portal hypertension: Secondary | ICD-10-CM | POA: Diagnosis not present

## 2015-09-20 DIAGNOSIS — T451X5A Adverse effect of antineoplastic and immunosuppressive drugs, initial encounter: Secondary | ICD-10-CM

## 2015-09-20 DIAGNOSIS — B370 Candidal stomatitis: Secondary | ICD-10-CM

## 2015-09-20 DIAGNOSIS — R112 Nausea with vomiting, unspecified: Secondary | ICD-10-CM

## 2015-09-20 DIAGNOSIS — K7460 Unspecified cirrhosis of liver: Secondary | ICD-10-CM | POA: Diagnosis not present

## 2015-09-20 DIAGNOSIS — C090 Malignant neoplasm of tonsillar fossa: Secondary | ICD-10-CM

## 2015-09-20 DIAGNOSIS — E870 Hyperosmolality and hypernatremia: Secondary | ICD-10-CM | POA: Insufficient documentation

## 2015-09-20 DIAGNOSIS — K1230 Oral mucositis (ulcerative), unspecified: Secondary | ICD-10-CM | POA: Diagnosis not present

## 2015-09-20 DIAGNOSIS — Z923 Personal history of irradiation: Secondary | ICD-10-CM | POA: Insufficient documentation

## 2015-09-20 DIAGNOSIS — I10 Essential (primary) hypertension: Secondary | ICD-10-CM | POA: Diagnosis not present

## 2015-09-20 DIAGNOSIS — Z91030 Bee allergy status: Secondary | ICD-10-CM | POA: Insufficient documentation

## 2015-09-20 DIAGNOSIS — D6481 Anemia due to antineoplastic chemotherapy: Secondary | ICD-10-CM | POA: Diagnosis not present

## 2015-09-20 DIAGNOSIS — C099 Malignant neoplasm of tonsil, unspecified: Secondary | ICD-10-CM

## 2015-09-20 DIAGNOSIS — F17210 Nicotine dependence, cigarettes, uncomplicated: Secondary | ICD-10-CM | POA: Diagnosis not present

## 2015-09-20 DIAGNOSIS — E871 Hypo-osmolality and hyponatremia: Secondary | ICD-10-CM | POA: Diagnosis present

## 2015-09-20 DIAGNOSIS — B3789 Other sites of candidiasis: Secondary | ICD-10-CM | POA: Insufficient documentation

## 2015-09-20 LAB — CBC WITH DIFFERENTIAL/PLATELET
BASOS ABS: 0 10*3/uL (ref 0.0–0.1)
BASOS PCT: 1 %
EOS ABS: 0 10*3/uL (ref 0.0–0.7)
Eosinophils Relative: 1 %
HEMATOCRIT: 26.3 % — AB (ref 39.0–52.0)
HEMOGLOBIN: 9.2 g/dL — AB (ref 13.0–17.0)
LYMPHS ABS: 0.6 10*3/uL — AB (ref 0.7–4.0)
Lymphocytes Relative: 10 %
MCH: 29.8 pg (ref 26.0–34.0)
MCHC: 35 g/dL (ref 30.0–36.0)
MCV: 85.1 fL (ref 78.0–100.0)
Monocytes Absolute: 0.5 10*3/uL (ref 0.1–1.0)
Monocytes Relative: 9 %
NEUTROS PCT: 80 %
Neutro Abs: 4.9 10*3/uL (ref 1.7–7.7)
PLATELETS: 143 10*3/uL — AB (ref 150–400)
RBC: 3.09 MIL/uL — AB (ref 4.22–5.81)
RDW: 15 % (ref 11.5–15.5)
WBC: 6.1 10*3/uL (ref 4.0–10.5)

## 2015-09-20 LAB — COMPREHENSIVE METABOLIC PANEL
ALT: 11 U/L — ABNORMAL LOW (ref 17–63)
ANION GAP: 8 (ref 5–15)
AST: 10 U/L — ABNORMAL LOW (ref 15–41)
Albumin: 3.2 g/dL — ABNORMAL LOW (ref 3.5–5.0)
Alkaline Phosphatase: 35 U/L — ABNORMAL LOW (ref 38–126)
BILIRUBIN TOTAL: 0.3 mg/dL (ref 0.3–1.2)
BUN: 22 mg/dL — AB (ref 6–20)
CO2: 26 mmol/L (ref 22–32)
Calcium: 8.3 mg/dL — ABNORMAL LOW (ref 8.9–10.3)
Chloride: 94 mmol/L — ABNORMAL LOW (ref 101–111)
Creatinine, Ser: 0.83 mg/dL (ref 0.61–1.24)
Glucose, Bld: 121 mg/dL — ABNORMAL HIGH (ref 65–99)
POTASSIUM: 3.7 mmol/L (ref 3.5–5.1)
Sodium: 128 mmol/L — ABNORMAL LOW (ref 135–145)
TOTAL PROTEIN: 6.3 g/dL — AB (ref 6.5–8.1)

## 2015-09-20 LAB — ABO/RH: ABO/RH(D): A POS

## 2015-09-20 LAB — PREPARE RBC (CROSSMATCH)

## 2015-09-20 LAB — MAGNESIUM: MAGNESIUM: 1.8 mg/dL (ref 1.7–2.4)

## 2015-09-20 MED ORDER — SODIUM CHLORIDE 0.9 % IV BOLUS (SEPSIS)
1000.0000 mL | Freq: Once | INTRAVENOUS | Status: AC
  Administered 2015-09-20: 1000 mL via INTRAVENOUS

## 2015-09-20 MED ORDER — PANTOPRAZOLE SODIUM 40 MG IV SOLR
40.0000 mg | INTRAVENOUS | Status: DC
  Filled 2015-09-20: qty 40

## 2015-09-20 MED ORDER — ENOXAPARIN SODIUM 40 MG/0.4ML ~~LOC~~ SOLN
40.0000 mg | SUBCUTANEOUS | Status: DC
  Administered 2015-09-20: 40 mg via SUBCUTANEOUS
  Filled 2015-09-20: qty 0.4

## 2015-09-20 MED ORDER — FENTANYL 12 MCG/HR TD PT72
12.5000 ug | MEDICATED_PATCH | TRANSDERMAL | Status: DC
  Administered 2015-09-20: 12.5 ug via TRANSDERMAL
  Filled 2015-09-20: qty 1

## 2015-09-20 MED ORDER — PROCHLORPERAZINE MALEATE 5 MG PO TABS: 10.0000 mg | ORAL_TABLET | Freq: Four times a day (QID) | ORAL | Status: DC | PRN

## 2015-09-20 MED ORDER — SODIUM CHLORIDE 0.9 % IV SOLN: 8.0000 mg | Freq: Three times a day (TID) | INTRAVENOUS | Status: DC

## 2015-09-20 MED ORDER — SODIUM CHLORIDE 0.9 % IV SOLN
INTRAVENOUS | Status: DC
  Administered 2015-09-20: 17:00:00 via INTRAVENOUS

## 2015-09-20 MED ORDER — FENTANYL 12 MCG/HR TD PT72: MEDICATED_PATCH | TRANSDERMAL | Status: DC

## 2015-09-20 MED ORDER — LORAZEPAM 2 MG/ML IJ SOLN
INTRAMUSCULAR | Status: AC
  Filled 2015-09-20: qty 1

## 2015-09-20 MED ORDER — NICOTINE 21 MG/24HR TD PT24
21.0000 mg | MEDICATED_PATCH | Freq: Every day | TRANSDERMAL | Status: DC
  Filled 2015-09-20 (×2): qty 1

## 2015-09-20 MED ORDER — DIPHENHYDRAMINE HCL 25 MG PO CAPS: 25.0000 mg | ORAL_CAPSULE | Freq: Once | ORAL | Status: DC

## 2015-09-20 MED ORDER — FLUCONAZOLE 40 MG/ML PO SUSR
100.0000 mg | Freq: Every day | ORAL | Status: DC
  Administered 2015-09-20 – 2015-09-21 (×2): 100 mg via ORAL
  Filled 2015-09-20 (×4): qty 2.5

## 2015-09-20 MED ORDER — ACETAMINOPHEN 325 MG PO TABS
650.0000 mg | ORAL_TABLET | Freq: Four times a day (QID) | ORAL | Status: DC | PRN
Start: 2015-09-20 — End: 2015-09-21

## 2015-09-20 MED ORDER — LISINOPRIL 10 MG PO TABS
20.0000 mg | ORAL_TABLET | Freq: Every day | ORAL | Status: DC
  Administered 2015-09-21: 20 mg via ORAL
  Filled 2015-09-20 (×2): qty 2

## 2015-09-20 MED ORDER — LORAZEPAM 0.5 MG PO TABS: 0.5000 mg | ORAL_TABLET | ORAL | Status: DC

## 2015-09-20 MED ORDER — MAGIC MOUTHWASH
5.0000 mL | Freq: Three times a day (TID) | ORAL | Status: DC
  Administered 2015-09-20 (×2): 5 mL via ORAL
  Filled 2015-09-20 (×3): qty 5

## 2015-09-20 MED ORDER — SUCRALFATE 1 G PO TABS
1.0000 g | ORAL_TABLET | Freq: Four times a day (QID) | ORAL | Status: DC | PRN
  Filled 2015-09-20: qty 1

## 2015-09-20 MED ORDER — METRONIDAZOLE IN NACL 5-0.79 MG/ML-% IV SOLN
500.0000 mg | Freq: Three times a day (TID) | INTRAVENOUS | Status: DC
  Administered 2015-09-20 – 2015-09-21 (×3): 500 mg via INTRAVENOUS
  Filled 2015-09-20 (×2): qty 100

## 2015-09-20 MED ORDER — LISINOPRIL-HYDROCHLOROTHIAZIDE 20-25 MG PO TABS: 1.0000 | ORAL_TABLET | Freq: Every day | ORAL | Status: DC

## 2015-09-20 MED ORDER — GUAIFENESIN 100 MG/5ML PO SOLN: 5.0000 mL | Freq: Four times a day (QID) | ORAL | Status: DC | PRN

## 2015-09-20 MED ORDER — SODIUM CHLORIDE 0.9 % IV SOLN
10.0000 mg | Freq: Once | INTRAVENOUS | Status: AC
  Administered 2015-09-20: 10 mg via INTRAVENOUS
  Filled 2015-09-20: qty 1

## 2015-09-20 MED ORDER — LORAZEPAM 2 MG/ML IJ SOLN
0.5000 mg | INTRAMUSCULAR | Status: AC
  Administered 2015-09-20: 0.5 mg via INTRAVENOUS

## 2015-09-20 MED ORDER — SODIUM FLUORIDE 1.1 % DT GEL
Freq: Every day | DENTAL | Status: DC
  Administered 2015-09-20: 22:00:00 via DENTAL

## 2015-09-20 MED ORDER — SODIUM CHLORIDE 0.9% FLUSH
10.0000 mL | Freq: Once | INTRAVENOUS | Status: AC
  Administered 2015-09-20: 10 mL via INTRAVENOUS

## 2015-09-20 MED ORDER — DIPHENHYDRAMINE HCL 12.5 MG/5ML PO ELIX
25.0000 mg | ORAL_SOLUTION | Freq: Once | ORAL | Status: AC
  Administered 2015-09-20: 25 mg via ORAL
  Filled 2015-09-20: qty 10

## 2015-09-20 MED ORDER — PALONOSETRON HCL INJECTION 0.25 MG/5ML
0.2500 mg | Freq: Once | INTRAVENOUS | Status: AC
  Administered 2015-09-20: 0.25 mg via INTRAVENOUS

## 2015-09-20 MED ORDER — PALONOSETRON HCL INJECTION 0.25 MG/5ML
INTRAVENOUS | Status: AC
  Filled 2015-09-20: qty 5

## 2015-09-20 MED ORDER — JEVITY 1.2 CAL PO LIQD
475.0000 mL | ORAL | Status: DC
  Administered 2015-09-20 – 2015-09-21 (×4): 475 mL
  Filled 2015-09-20 (×15): qty 3000

## 2015-09-20 MED ORDER — JEVITY 1.2 CAL/FIBER PO LIQD: 2.0000 | ORAL | Status: DC

## 2015-09-20 MED ORDER — ACETAMINOPHEN 325 MG PO TABS: 650.0000 mg | ORAL_TABLET | Freq: Once | ORAL | Status: DC

## 2015-09-20 MED ORDER — FLUCONAZOLE 10 MG/ML PO SUSR: 100.0000 mg | Freq: Every day | ORAL | Status: DC

## 2015-09-20 MED ORDER — SODIUM CHLORIDE 0.9 % IV SOLN
INTRAVENOUS | Status: AC
  Administered 2015-09-20: 09:00:00 via INTRAVENOUS

## 2015-09-20 MED ORDER — LORAZEPAM 2 MG/ML IJ SOLN: 0.5000 mg | INTRAMUSCULAR | Status: DC | PRN

## 2015-09-20 MED ORDER — HYDROCHLOROTHIAZIDE 25 MG PO TABS
25.0000 mg | ORAL_TABLET | Freq: Every day | ORAL | Status: DC
  Administered 2015-09-21: 25 mg via ORAL
  Filled 2015-09-20 (×2): qty 1

## 2015-09-20 MED ORDER — PANTOPRAZOLE SODIUM 40 MG PO TBEC
40.0000 mg | DELAYED_RELEASE_TABLET | Freq: Every day | ORAL | Status: DC
  Administered 2015-09-21: 40 mg via ORAL
  Filled 2015-09-20: qty 1

## 2015-09-20 MED ORDER — ONDANSETRON HCL 4 MG PO TABS
8.0000 mg | ORAL_TABLET | Freq: Three times a day (TID) | ORAL | Status: DC
  Administered 2015-09-20 – 2015-09-21 (×3): 8 mg via ORAL
  Filled 2015-09-20 (×3): qty 2

## 2015-09-20 MED ORDER — POTASSIUM CHLORIDE IN NACL 40-0.9 MEQ/L-% IV SOLN
INTRAVENOUS | Status: DC
  Administered 2015-09-20: 125 mL/h via INTRAVENOUS

## 2015-09-20 MED ORDER — SODIUM CHLORIDE 0.9 % IV SOLN
Freq: Once | INTRAVENOUS | Status: DC
Start: 2015-09-20 — End: 2015-09-21

## 2015-09-20 MED ORDER — ACETAMINOPHEN 650 MG RE SUPP: 650.0000 mg | Freq: Four times a day (QID) | RECTAL | Status: DC | PRN

## 2015-09-20 MED ORDER — ACETAMINOPHEN 160 MG/5ML PO SOLN
650.0000 mg | Freq: Four times a day (QID) | ORAL | Status: DC | PRN
  Administered 2015-09-20: 650 mg via ORAL
  Filled 2015-09-20: qty 20.3

## 2015-09-20 MED ORDER — LORAZEPAM 2 MG/ML IJ SOLN
0.5000 mg | Freq: Once | INTRAMUSCULAR | Status: AC
  Administered 2015-09-20: 0.5 mg via INTRAVENOUS

## 2015-09-20 MED ORDER — PROCHLORPERAZINE EDISYLATE 5 MG/ML IJ SOLN
10.0000 mg | Freq: Four times a day (QID) | INTRAMUSCULAR | Status: DC | PRN
  Filled 2015-09-20: qty 2

## 2015-09-20 MED ORDER — HEPARIN SOD (PORK) LOCK FLUSH 100 UNIT/ML IV SOLN: 500.0000 [IU] | Freq: Once | INTRAVENOUS | Status: DC

## 2015-09-20 MED ORDER — OXYCODONE HCL 5 MG/5ML PO SOLN
5.0000 mg | ORAL | Status: DC | PRN
Start: 2015-09-20 — End: 2015-09-21
  Administered 2015-09-20: 5 mg via ORAL
  Filled 2015-09-20: qty 5

## 2015-09-20 MED ORDER — MUPIROCIN 2 % EX OINT
1.0000 "application " | TOPICAL_OINTMENT | Freq: Two times a day (BID) | CUTANEOUS | Status: DC
  Administered 2015-09-21: 1 via NASAL
  Filled 2015-09-20: qty 22

## 2015-09-20 MED ORDER — FENTANYL 25 MCG/HR TD PT72: 25.0000 ug | MEDICATED_PATCH | TRANSDERMAL | Status: DC

## 2015-09-20 NOTE — H&P (Addendum)
History and Physical  Frank Castillo [id number] DOB: 04-11-1951 DOA: 09/20/2015  Referring physician: Robynn Pane, PA-C PCP: No primary care provider on file.  Outpatient Specialists:   Dr Whitney Muse (Oncology)  Dr Benjamine Mola (ENT)  Chief Complaint: Vomiting  HPI: Frank Castillo is a 65 y.o. 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 The patient has been undergoing chemotherapy with carboplatin and radiation. He's had a side effect of oral candidiasis and has had some improvement on oral Diflucan. The patient presents today from the oncology center with nausea and vomiting that started earlier today. The patient had 2 different antiemetics, which was not helpful. Due to continued vomiting, the patient was admitted to the hospital. No other provoking or palliating factors. Emesis described just a stomach contents, although the patient has had limited by mouth intake. The patient has tube feeds through his PEG tube.   Review of Systems:   Pt denies any fevers, chills, nausea, vomiting, diarrhea, constipation, abdominal pain, shortness of breath, dyspnea on exertion, orthopnea, cough, wheezing, palpitations, headache, vision changes, lightheadedness, dizziness, melena, rectal bleeding.  Review of systems are otherwise negative  Past Medical History  Diagnosis Date  . Tonsillar mass     left  . Portal hypertension ([place])   . Hypertension   . Cirrhosis [facility])    Past Surgical History  Procedure Laterality Date  . Hernia repair Right   . Mouth surgery    . Tonsillectomy Left 07/09/2015    Procedure: LEFT TONSILLECTOMY;  Surgeon: Leta Baptist, MD;  Location: [place];  Service: ENT;  Laterality: Left;   Social History:  reports that he has been smoking Cigarettes.  He has been smoking about 0.50 packs per day. He has never used smokeless tobacco. He reports that he does not drink alcohol or use  illicit drugs. Patient lives at home  Allergies  Allergen Reactions  . Bee Venom Swelling    Family History  Problem Relation Age of Onset  . Macular degeneration Mother   . Alcoholism Father       Prior to Admission medications   Medication Sig Start Date End Date Taking? Authorizing Provider  CARBOPLATIN IV Inject into the vein. Once every 21 days x 3 cycles   Yes Historical Provider, MD  cetirizine (ZYRTEC) 10 MG tablet Take 10 mg by mouth daily.   Yes Historical Provider, MD  cholecalciferol (VITAMIN D) 1000 units tablet Take 1,000 Units by mouth daily.   Yes Historical Provider, MD  Diphenhyd-Hydrocort-Nystatin (FIRST-DUKES MOUTHWASH) SUSP Use as directed 5 mLs in the mouth or throat 4 (four) times daily -  before meals and at bedtime. 09/05/15  Yes Manon Hilding Kefalas, PA-C  fluconazole (DIFLUCAN) 10 MG/ML suspension Take 10 mLs (100 mg total) by mouth daily. 09/20/15  Yes Manon Hilding Kefalas, PA-C  guaiFENesin (ROBITUSSIN) 100 MG/5ML SOLN Take 5 mLs (100 mg total) by mouth every 6 (six) hours as needed for cough or to loosen phlegm. 09/09/15  Yes Manon Hilding Kefalas, PA-C  lidocaine (XYLOCAINE) 2 % solution Use as directed 20 mLs in the mouth or throat every 4 (four) hours as needed for mouth pain.  09/10/15  Yes Historical Provider, MD  lidocaine-prilocaine (EMLA) cream Apply a quarter size amount to port site 1 hour prior to chemo. Do not rub in. Cover with plastic wrap. 08/15/15  Yes Patrici Ranks, MD  lisinopril-hydrochlorothiazide (PRINZIDE,ZESTORETIC) 20-25 MG tablet Take 1 tablet by  mouth daily. 07/17/15  Yes Historical Provider, MD  Melatonin 10 MG TABS Take 1 tablet by mouth daily as needed (sleep).    Yes Historical Provider, MD  mupirocin ointment (BACTROBAN) 2 % Place 1 application into the nose 2 (two) times daily. 09/18/15  Yes Baird Cancer, PA-C  nicotine (NICODERM CQ) 21 mg/24hr patch apply 21 mg patch daily x6wk, then 14 mg patch daily x2wk, then 7 mg patch daily x2wk  08/02/15  Yes Eppie Gibson, MD  Nutritional Supplements (JEVITY 1.2 CAL/FIBER) LIQD 8 cans/day.  4 feeding of 2 cans each. Flush with 100 mls before and after each feeding 08/23/15  Yes Patrici Ranks, MD  ondansetron (ZOFRAN ODT) 8 MG disintegrating tablet Take 1 tablet (8 mg total) by mouth every 8 (eight) hours as needed for nausea or vomiting. 08/16/15  Yes Patrici Ranks, MD  oxyCODONE (ROXICODONE) 5 MG/5ML solution Take 5-10 mLs (5-10 mg total) by mouth every 4 (four) hours as needed for severe pain. 08/28/15  Yes Patrici Ranks, MD  pantoprazole (PROTONIX) 40 MG tablet Take 1 tablet (40 mg total) by mouth daily. 08/23/15  Yes Patrici Ranks, MD  prochlorperazine (COMPAZINE) 10 MG tablet Take 1 tablet (10 mg total) by mouth every 6 (six) hours as needed for nausea or vomiting. 08/15/15  Yes Patrici Ranks, MD  sodium fluoride (FLUORISHIELD) 1.1 % GEL dental gel Instill one drop of gel per tooth space of fluoride tray. Place over teeth for 5 minutes. Remove. Spit out excess. Repeat nightly. 08/16/15  Yes Patrici Ranks, MD  sucralfate (CARAFATE) 1 g tablet DISSOLVE 1 TAB IN 10MLS OF WATER AND SWALLOW UP TO FOUR TIMES DAILY AS NEEDED FOR SORENESS OF THROAT 08/28/15  Yes Historical Provider, MD  traMADol (ULTRAM) 50 MG tablet Take 1 tablet (50 mg total) by mouth every 6 (six) hours as needed. 09/16/15  Yes Manon Hilding Kefalas, PA-C  fentaNYL (DURAGESIC - DOSED MCG/HR) 12 MCG/HR Apply to skin. Change every 72 hours. 09/20/15   Patrici Ranks, MD  fluorouracil CALGB 16109 in sodium chloride 0.9 % 150 mL Inject into the vein over 96 hr. X 3 cycles    Historical Provider, MD  LORazepam (ATIVAN) 0.5 MG tablet Take 1 tablet (0.5 mg total) by mouth every 4 (four) hours. 09/20/15   Patrici Ranks, MD  nicotine (NICODERM CQ) 14 mg/24hr patch apply 21 mg patch daily x6wk, then 14 mg patch daily x2wk, then 7 mg patch daily x2wk 08/02/15   Eppie Gibson, MD  nicotine (NICODERM CQ) 7 mg/24hr patch apply 21  mg patch daily x6wk, then 14 mg patch daily x2wk, then 7 mg patch daily x2wk 08/02/15   Eppie Gibson, MD  oxyCODONE-acetaminophen (ROXICET) 5-325 MG tablet Take 1-2 tablets by mouth every 4 (four) hours as needed for severe pain. Patient not taking: Reported on 09/20/2015 08/20/15   Saverio Danker, PA-C    Physical Exam: Ht 5\' 10"  (1.778 m)  Wt 82.101 kg (181 lb)  BMI 25.97 kg/m2  General: Middle-aged Caucasian male. Awake and alert and oriented x3. No acute cardiopulmonary distress.  HEENT: Normocephalic atraumatic.  Right and left ears normal in appearance.  Pupils equal, round, reactive to light. Extraocular muscles are intact. Sclerae anicteric and noninjected.  Moist mucosal membranes. No mucosal lesions.  Neck: Neck supple without lymphadenopathy. No carotid bruits. No masses palpated.  Cardiovascular: Regular rate with normal S1-S2 sounds. No murmurs, rubs, gallops auscultated. No JVD.  Respiratory: Good respiratory effort  with no wheezes, rales, rhonchi. Lungs clear to auscultation bilaterally.  No accessory muscle use. Abdomen: Soft, nontender, nondistended. Active bowel sounds. No masses or hepatosplenomegaly  Skin: No rashes, lesions, or ulcerations.  Dry, warm to touch. 2+ dorsalis pedis and radial pulses. Musculoskeletal: No calf or leg pain. All major joints not erythematous nontender.  No upper or lower joint deformation.  Good ROM.  No contractures  Psychiatric: Intact judgment and insight. Pleasant and cooperative. Neurologic: No focal neurological deficits. Strength is 5/5 and symmetric in upper and lower extremities.  Cranial nerves II through XII are grossly intact.           Labs on Admission: I have personally reviewed following labs and imaging studies  CBC:  Recent Labs Lab 09/20/15 1108  WBC 6.1  NEUTROABS 4.9  HGB 9.2*  HCT 26.3*  MCV 85.1  PLT [id number]*   Basic Metabolic Panel:  Recent Labs Lab 09/20/15 1108  NA 128*  K 3.7  CL 94*  CO2 26  GLUCOSE  121*  BUN 22*  CREATININE 0.83  CALCIUM 8.3*  MG 1.8   GFR: Estimated Creatinine Clearance: 95.3 mL/min (by C-G formula based on Cr of 0.83). Liver Function Tests:  Recent Labs Lab 09/20/15 1108  AST 10*  ALT 11*  ALKPHOS 35*  BILITOT 0.3  PROT 6.3*  ALBUMIN 3.2*   No results for input(s): LIPASE, AMYLASE in the last 168 hours. No results for input(s): AMMONIA in the last 168 hours. Coagulation Profile: No results for input(s): INR, PROTIME in the last 168 hours. Cardiac Enzymes: No results for input(s): CKTOTAL, CKMB, CKMBINDEX, TROPONINI in the last 168 hours. BNP (last 3 results) No results for input(s): PROBNP in the last 8760 hours. HbA1C: No results for input(s): HGBA1C in the last 72 hours. CBG: No results for input(s): GLUCAP in the last 168 hours. Lipid Profile: No results for input(s): CHOL, HDL, LDLCALC, TRIG, CHOLHDL, LDLDIRECT in the last 72 hours. Thyroid Function Tests: No results for input(s): TSH, T4TOTAL, FREET4, T3FREE, THYROIDAB in the last 72 hours. Anemia Panel: No results for input(s): VITAMINB12, FOLATE, FERRITIN, TIBC, IRON, RETICCTPCT in the last 72 hours. Urine analysis:    Component Value Date/Time   COLORURINE Amber 10/23/2011 0002   APPEARANCEUR Cloudy 10/23/2011 0002   LABSPEC 1.026 10/23/2011 0002   PHURINE 5.0 10/23/2011 0002   GLUCOSEU Negative 10/23/2011 0002   HGBUR Negative 10/23/2011 0002   BILIRUBINUR 1+ 10/23/2011 0002   KETONESUR 1+ 10/23/2011 0002   PROTEINUR 30 mg/dL 10/23/2011 0002   NITRITE Negative 10/23/2011 0002   LEUKOCYTESUR Negative 10/23/2011 0002   Sepsis Labs: @LABRCNTIP (procalcitonin:4,lacticidven:4) )No results found for this or any previous visit (from the past 240 hour(s)).   Radiological Exams on Admission: No results found.  Assessment/Plan: Active Problems:   Squamous cell carcinoma of left tonsil (HCC)   Hypertension   Nausea with vomiting   Hyponatremia   Oral pharyngeal candidiasis    Antineoplastic chemotherapy induced anemia    This patient was discussed with the ED physician, including pertinent vitals, physical exam findings, labs, and imaging  We also discussed care given by the ED provider.  #1 nausea with vomiting  Observation status   Scheduled IV Zofran with Compazine  Normal saline bolus  Continuous IV fluids with potassium #2 hypernatremia  Likely secondary to acute nausea and vomiting  IV fluids as above  Repeat BMP in the morning #3 oral pharyngeal candidiasis  Continue oral Diflucan #4 hypertension  Continue all medications #5  squamous cell carcinoma of left tonsil  Start fentanyl patch 12.5 mg per hour  Oxycodone for breakthrough pain #6 chemotherapy-induced anemia  Type and crossmatch 2 units  Transfuse 2 units  Repeat H&H after transfusion   DVT prophylaxis: Lovenox Consultants: None Code Status: Full code Family Communication: Family in the room  Disposition Plan: Observation, likely DC to home tomorrow   Truett Mainland, DO [facility] Pager [phone]  If 7PM-7AM, please contact night-coverage [email or web address] Password [email or web address]

## 2015-09-20 NOTE — Progress Notes (Signed)
Patient to be admitted to room 325. Report given to Janan Ridge, RN.

## 2015-09-20 NOTE — Treatment Plan (Signed)
Intractable n/v with mucositis with stage 4 scc of tonsil. Seen in clinic today for IVF, noted to have severe n/v despite multiple anti-emetics. Radiation for neck cancer with uncontrolled pain despite duragesic patch.  Direct admit for IVF hydration, symptom control, and pain control.  Pt accepted to med-tele bed.  Staff to notify Hospitalist Buyer, retail at [phone] when pt arrives

## 2015-09-20 NOTE — Progress Notes (Signed)
Patient with Stage IVA squamous cell carcinoma of left tonsil (T2N2BM0) undergoing concomitant chemoradiation with curative intent is here for IV fluids as scheduled in a M-W-F fashion.  He reported nausea with vomiting today and he was therefore given Aloxi, Ativan, and Dexamethasone early this AM.  It has not resolved and he continues to vomit.  Vitals are normal with BP of 123/72, afebrile, normal pulse, respirations, and O2 saturation (on room air).  He is also having issues with severe mucositis, secondary to oral candidiasis (and possibly 5FU chemotherapy) that has improved some.  He has been on Magic mouthwash and treatment was escalated further with IV Diflucan on 4/26 and rx for liquid diflucan.  He is dependent on tube feedings at this time as a result of mucositis and side effects of treatment.  Pain control is another issue.  Given his history of drug abuse, including illicit and EtOHism, he has been very resistant to narcotic pain medications.  He has been sober for years and in our opinion, he is very legitemite.  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  Given his line of work, he still must undergo urine drug screening and as a result, he has refused all pain medication as he still works.  Today, due to his pain, he finally agreed to narcotic pain medication.  He has been prescribe 25 mcg/hr of Fentanyl transdermally.  He has not yet started the medication to date.  Given his XRT, we will try to maintain a HGB of ~ 10 g/dl.    Due to his nausea/vomiting, I have discussed the case with Dr. Earlie Counts, hospitalist, who has agreed to accept the patient in a direct admission fashion.  We would recommend antiemetics scheduled, in addition to PRN, IV hydration, continued supportive care for mucositis and thrush, and maintenance of HGB at 10 g/dL with PRBCs (no special requirements for blood products).  We will call bed control and call [phone] when patient is in the hospital in his  room.  Renesmay Nesbitt, PA-C 09/20/2015 1:45 PM

## 2015-09-21 DIAGNOSIS — B370 Candidal stomatitis: Secondary | ICD-10-CM

## 2015-09-21 DIAGNOSIS — C099 Malignant neoplasm of tonsil, unspecified: Secondary | ICD-10-CM | POA: Diagnosis not present

## 2015-09-21 DIAGNOSIS — E871 Hypo-osmolality and hyponatremia: Secondary | ICD-10-CM | POA: Diagnosis not present

## 2015-09-21 LAB — COMPREHENSIVE METABOLIC PANEL
ALK PHOS: 36 U/L — AB (ref 38–126)
ALT: 11 U/L — ABNORMAL LOW (ref 17–63)
ANION GAP: 7 (ref 5–15)
AST: 14 U/L — AB (ref 15–41)
Albumin: 3 g/dL — ABNORMAL LOW (ref 3.5–5.0)
BILIRUBIN TOTAL: 0.6 mg/dL (ref 0.3–1.2)
BUN: 20 mg/dL (ref 6–20)
CALCIUM: 8.7 mg/dL — AB (ref 8.9–10.3)
CO2: 24 mmol/L (ref 22–32)
Chloride: 101 mmol/L (ref 101–111)
Creatinine, Ser: 0.67 mg/dL (ref 0.61–1.24)
GFR calc Af Amer: >60 mL/min (ref >60)
Glucose, Bld: 169 mg/dL — ABNORMAL HIGH (ref 65–99)
POTASSIUM: 4.5 mmol/L (ref 3.5–5.1)
Sodium: 132 mmol/L — ABNORMAL LOW (ref 135–145)
TOTAL PROTEIN: 6 g/dL — AB (ref 6.5–8.1)

## 2015-09-21 LAB — CBC WITH DIFFERENTIAL/PLATELET
BASOS ABS: 0 10*3/uL (ref 0.0–0.1)
BASOS PCT: 0 %
EOS ABS: 0 10*3/uL (ref 0.0–0.7)
Eosinophils Relative: 0 %
HCT: 30.4 % — ABNORMAL LOW (ref 39.0–52.0)
HEMOGLOBIN: 10.4 g/dL — AB (ref 13.0–17.0)
Lymphocytes Relative: 7 %
Lymphs Abs: 0.5 10*3/uL — ABNORMAL LOW (ref 0.7–4.0)
MCH: 29.5 pg (ref 26.0–34.0)
MCHC: 34.2 g/dL (ref 30.0–36.0)
MCV: 86.1 fL (ref 78.0–100.0)
MONO ABS: 0.3 10*3/uL (ref 0.1–1.0)
MONOS PCT: 4 %
NEUTROS PCT: 89 %
Neutro Abs: 6 10*3/uL (ref 1.7–7.7)
Platelets: 139 10*3/uL — ABNORMAL LOW (ref 150–400)
RBC: 3.53 MIL/uL — ABNORMAL LOW (ref 4.22–5.81)
RDW: 14.4 % (ref 11.5–15.5)
WBC: 6.8 10*3/uL (ref 4.0–10.5)

## 2015-09-21 MED ORDER — NYSTATIN 100000 UNIT/ML MT SUSP
5.0000 mL | Freq: Four times a day (QID) | OROMUCOSAL | Status: DC
  Administered 2015-09-21: 500000 [IU] via ORAL
  Filled 2015-09-21: qty 5

## 2015-09-21 MED ORDER — NYSTATIN 100000 UNIT/ML MT SUSP: 5.0000 mL | Freq: Four times a day (QID) | OROMUCOSAL | Status: DC

## 2015-09-21 MED ORDER — HEPARIN SOD (PORK) LOCK FLUSH 100 UNIT/ML IV SOLN
500.0000 [IU] | INTRAVENOUS | Status: AC | PRN
  Administered 2015-09-21: 500 [IU]
  Filled 2015-09-21: qty 5

## 2015-09-21 MED ORDER — METRONIDAZOLE 50 MG/ML ORAL SUSPENSION
500.0000 mg | Freq: Three times a day (TID) | ORAL | Status: DC
Start: 2015-09-21 — End: 2015-09-25

## 2015-09-21 NOTE — Progress Notes (Signed)
Pt's port de-accessed.  Pt's port site clean dry and intact. Discharge instructions including medications and follow up appointments reviewed and discussed with patient's wife. Pt's wife verbalized understanding of discharge instructions including medications and follow up appointments. All questions were answered and no further questions at this time. Pt in stable condition and in no acute distress at time of discharge. Pt will be escorted by RN.

## 2015-09-21 NOTE — Progress Notes (Signed)
Initial Nutrition Assessment  DOCUMENTATION CODES:   Not applicable  INTERVENTION:   -Continue bolus feedings of 474 ml Jevity 1.2 4 times daily via PEG  Regimen provides 2280 kcals, 106 grams protein, and 1528 ml fluid daily (which meets 100% of estimated kcal and protein needs)  -If IVFs d/c, consider addition of 100 ml free water flush before and after each feeding administration (800 ml additional free water, 2328 ml total free water with inclusion of free water flush regimen  NUTRITION DIAGNOSIS:   Inadequate oral intake related to cancer and cancer related treatments as evidenced by  (PEG dependent).  GOAL:   Patient will meet greater than or equal to 90% of their needs   MONITOR:   PO intake, Diet advancement, Labs, Weight trends, TF tolerance, Skin, I & O's  REASON FOR ASSESSMENT:   Other (Comment) (TF)    ASSESSMENT:   Frank Castillo is a 65 y.o. 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 The patient has been undergoing chemotherapy with carboplatin and radiation. He's had a side effect of oral candidiasis and has had some improvement on oral Diflucan. The patient presents today from the oncology center with nausea and vomiting that started earlier today. The patient had 2 different antiemetics, which was not helpful. Due to continued vomiting, the patient was admitted to the hospital. No other provoking or palliating factors. Emesis described just a stomach contents, although the patient has had limited by mouth intake. The patient has tube feeds through his PEG tube.  RD drawn to chart due to TF orders. Chart reviewed and assessment completed remotely, due to RD not on site today.   Pt directly admitted from [facility] for nausea and vomiting. Pt with hx of squamous cell carcinoma of left tonsil, for which he is current undergoing chemotherapy and radiation at [facility].   Reviewed notes from  [facility] RD; pt's prescribed regimen is 474 ml Jevity 1.2 4 times daily, with 100 ml free water flush before and after each administration via PE. Complete regimen provides 2280 kcals, 106 grams protein, and 2328 ml fluid daily. Per RD notes, pt is generally using 6-7 cans at home (1710-1995 kcals, 79-92 grams protein, and 1146-1337 ml fluid daily, meeting 78-90% of estimated kcal needs and 79-92% of estimated protein needs) and is not consistent with flushing regimen. PO intake is very minimal and pt receives majority of nutrition via enteral route.   Pt is currently on a clear liquid diet. No intake data currently available.   Wt hx reviewed. Wt has been stable over the past 2 months.   Unable to complete Nutrition-Focused physical exam at this time.   Pt receiving 0.9% NaCl and K infusion at 125 ml/hr.   Labs reviewed: Na: 132 (on IV supplementation), Glucose: 169.   Diet Order:  Diet clear liquid Room service appropriate?: Yes; Fluid consistency:: Thin  Skin:  Reviewed, no issues  Last BM:  PTA  Height:   Ht Readings from Last 1 Encounters:  09/20/15 5\' 10"  (1.778 m)    Weight:   Wt Readings from Last 1 Encounters:  09/20/15 181 lb (82.101 kg)    Ideal Body Weight:  75.4 kg  BMI:  Body mass index is 25.97 kg/(m^2).  Estimated Nutritional Needs:   Kcal:  2200-2400  Protein:  100-115 grams  Fluid:  2.2-2.4 L  EDUCATION NEEDS:   No education needs identified at this time  Frank Castillo A.  Frank Castillo, RD, LDN, CDE Pager: [phone] After hours Pager: [phone]

## 2015-09-21 NOTE — Discharge Instructions (Signed)
Tube feed recommendations:  -Continue bolus feedings of 474 ml Jevity 1.2 4 times daily via PEG -addition of 100 ml free water flush before and after each feeding administration (800 ml additional free water, 2328 ml total free water with inclusion of free water flush regimen

## 2015-09-21 NOTE — Discharge Summary (Signed)
Physician Discharge Summary  Frank Castillo [id number] DOB: Oct 06, 1950 DOA: 09/20/2015  PCP: No primary care provider on file.  Admit date: 09/20/2015 Discharge date: 09/21/2015  Time spent: 20 minutes  Recommendations for Outpatient Follow-up:  1. Follow up with PCP in 2-3 weeks  2. Follow up with Oncology as scheduled  Discharge Diagnoses:  Active Problems:   Squamous cell carcinoma of left tonsil (HCC)   Hypertension   Nausea with vomiting   Hyponatremia   Oral pharyngeal candidiasis   Antineoplastic chemotherapy induced anemia   Discharge Condition: Improved  Diet recommendation: Clear liquid diet with tube feeds  Tube feed recs:  -Continue bolus feedings of 474 ml Jevity 1.2 4 times daily via PEG -addition of 100 ml free water flush before and after each feeding administration (800 ml additional free water, 2328 ml total free water with inclusion of free water flush regimen  Filed Weights   09/20/15 1615  Weight: 82.101 kg (181 lb)    History of present illness:  Please review dictated H and P from 4/28 for details. Briefly, 65 y.o. 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 The patient has been undergoing chemotherapy with carboplatin and radiation. He's had a side effect of oral candidiasis and has had some improvement on oral Diflucan. The patient presents today from the oncology center with nausea and vomiting that started earlier today. The patient had 2 different antiemetics, which was not helpful. Due to continued vomiting, the patient was admitted to the hospital. No other provoking or palliating factors. Emesis described just a stomach contents, although the patient has had limited by mouth intake. The patient has tube feeds through his PEG tube.  Hospital Course:   #1 nausea with vomiting  Patient was scheduled with IV Zofran with Compazine  Patient was continued on continuous  IV fluids with potassium #2 hyponatremia  Likely secondary to acute nausea and vomiting  Improved with IVF overnight #3 oral pharyngeal candidiasis  Continued oral Diflucan #4 hypertension  Continued all medications #5 squamous cell carcinoma of left tonsil  Started fentanyl patch 12.5 mg per hour  Oxycodone for breakthrough pain #6 chemotherapy-induced anemia  Type and crossmatch 2 units  Transfuse 2 units  Repeat H&H after transfusion #7 mucositis - Continue antifungal as per home regimen - Will complete 6 more days of flagyl per tube   Discharge Exam: Filed Vitals:   09/21/15 0118 09/21/15 0300 09/21/15 0627 09/21/15 1045  BP: 125/77 140/88 118/70 123/74  Pulse: 83 86 76 81  Temp: 97.7 F (36.5 C) 98 F (36.7 C) 97.8 F (36.6 C) 98.1 F (36.7 C)  TempSrc: Oral Oral Oral Oral  Resp: 20 18 18 18   Height:      Weight:      SpO2: 100% 98% 98% 98%    General: Awake, in nad Cardiovascular: regular, s1, s2 Respiratory: normal resp effort, no wheezing  Discharge Instructions     Medication List    STOP taking these medications        oxyCODONE-acetaminophen 5-325 MG tablet  Commonly known as:  ROXICET      TAKE these medications        CARBOPLATIN IV  Inject into the vein. Once every 21 days x 3 cycles     cetirizine 10 MG tablet  Commonly known as:  ZYRTEC  Take 10 mg by mouth daily.     cholecalciferol 1000 units tablet  Commonly known  as:  VITAMIN D  Take 1,000 Units by mouth daily.     fentaNYL 12 MCG/HR  Commonly known as:  DURAGESIC - dosed mcg/hr  Apply to skin. Change every 72 hours.     FIRST-DUKES MOUTHWASH Susp  Use as directed 5 mLs in the mouth or throat 4 (four) times daily -  before meals and at bedtime.     fluconazole 10 MG/ML suspension  Commonly known as:  DIFLUCAN  Take 10 mLs (100 mg total) by mouth daily.     fluorouracil CALGB 16109 in sodium chloride 0.9 % 150 mL  Inject into the vein over 96 hr. X 3 cycles      guaiFENesin 100 MG/5ML Soln  Commonly known as:  ROBITUSSIN  Take 5 mLs (100 mg total) by mouth every 6 (six) hours as needed for cough or to loosen phlegm.     JEVITY 1.2 CAL/FIBER Liqd  8 cans/day.  4 feeding of 2 cans each. Flush with 100 mls before and after each feeding     lidocaine 2 % solution  Commonly known as:  XYLOCAINE  Use as directed 20 mLs in the mouth or throat every 4 (four) hours as needed for mouth pain.     lidocaine-prilocaine cream  Commonly known as:  EMLA  Apply a quarter size amount to port site 1 hour prior to chemo. Do not rub in. Cover with plastic wrap.     lisinopril-hydrochlorothiazide 20-25 MG tablet  Commonly known as:  PRINZIDE,ZESTORETIC  Take 1 tablet by mouth daily.     LORazepam 0.5 MG tablet  Commonly known as:  ATIVAN  Take 1 tablet (0.5 mg total) by mouth every 4 (four) hours.     Melatonin 10 MG Tabs  Take 1 tablet by mouth daily as needed (sleep).     metroNIDAZOLE 50 mg/ml oral suspension  Commonly known as:  FLAGYL  Take 10 mLs (500 mg total) by mouth 3 (three) times daily.     mupirocin ointment 2 %  Commonly known as:  BACTROBAN  Place 1 application into the nose 2 (two) times daily.     nicotine 21 mg/24hr patch  Commonly known as:  NICODERM CQ  apply 21 mg patch daily x6wk, then 14 mg patch daily x2wk, then 7 mg patch daily x2wk     nicotine 7 mg/24hr patch  Commonly known as:  NICODERM CQ  apply 21 mg patch daily x6wk, then 14 mg patch daily x2wk, then 7 mg patch daily x2wk     nicotine 14 mg/24hr patch  Commonly known as:  NICODERM CQ  apply 21 mg patch daily x6wk, then 14 mg patch daily x2wk, then 7 mg patch daily x2wk     nystatin 100000 UNIT/ML suspension  Commonly known as:  MYCOSTATIN  Take 5 mLs (500,000 Units total) by mouth 4 (four) times daily.     ondansetron 8 MG disintegrating tablet  Commonly known as:  ZOFRAN ODT  Take 1 tablet (8 mg total) by mouth every 8 (eight) hours as needed for nausea or  vomiting.     oxyCODONE 5 MG/5ML solution  Commonly known as:  ROXICODONE  Take 5-10 mLs (5-10 mg total) by mouth every 4 (four) hours as needed for severe pain.     pantoprazole 40 MG tablet  Commonly known as:  PROTONIX  Take 1 tablet (40 mg total) by mouth daily.     prochlorperazine 10 MG tablet  Commonly known as:  COMPAZINE  Take  1 tablet (10 mg total) by mouth every 6 (six) hours as needed for nausea or vomiting.     sodium fluoride 1.1 % Gel dental gel  Commonly known as:  FLUORISHIELD  Instill one drop of gel per tooth space of fluoride tray. Place over teeth for 5 minutes. Remove. Spit out excess. Repeat nightly.     sucralfate 1 g tablet  Commonly known as:  CARAFATE  DISSOLVE 1 TAB IN 10MLS OF WATER AND SWALLOW UP TO FOUR TIMES DAILY AS NEEDED FOR SORENESS OF THROAT     traMADol 50 MG tablet  Commonly known as:  ULTRAM  Take 1 tablet (50 mg total) by mouth every 6 (six) hours as needed.       Allergies  Allergen Reactions  . Bee Venom Swelling   Follow-up Information    Schedule an appointment as soon as possible for a visit with Follow up with your PCP in 2-3 weeks.   Why:  Hospital follow up      Follow up with Molli Hazard, MD On 09/25/2015.   Specialties:  Hematology and Oncology, Oncology   Why:  at 9:00am   Contact information:   [place]  [phone]        The results of significant diagnostics from this hospitalization (including imaging, microbiology, ancillary and laboratory) are listed below for reference.    Significant Diagnostic Studies: No results found.  Microbiology: No results found for this or any previous visit (from the past 240 hour(s)).   Labs: Basic Metabolic Panel:  Recent Labs Lab 09/20/15 1108 09/21/15 0549  NA 128* 132*  K 3.7 4.5  CL 94* 101  CO2 26 24  GLUCOSE 121* 169*  BUN 22* 20  CREATININE 0.83 0.67  CALCIUM 8.3* 8.7*  MG 1.8  --    Liver Function Tests:  Recent  Labs Lab 09/20/15 1108 09/21/15 0549  AST 10* 14*  ALT 11* 11*  ALKPHOS 35* 36*  BILITOT 0.3 0.6  PROT 6.3* 6.0*  ALBUMIN 3.2* 3.0*   No results for input(s): LIPASE, AMYLASE in the last 168 hours. No results for input(s): AMMONIA in the last 168 hours. CBC:  Recent Labs Lab 09/20/15 1108 09/21/15 0549  WBC 6.1 6.8  NEUTROABS 4.9 6.0  HGB 9.2* 10.4*  HCT 26.3* 30.4*  MCV 85.1 86.1  PLT 143* 139*   Cardiac Enzymes: No results for input(s): CKTOTAL, CKMB, CKMBINDEX, TROPONINI in the last 168 hours. BNP: BNP (last 3 results) No results for input(s): BNP in the last 8760 hours.  ProBNP (last 3 results) No results for input(s): PROBNP in the last 8760 hours.  CBG: No results for input(s): GLUCAP in the last 168 hours.   Signed:  CHIU, Orpah Melter  [facility] 09/21/2015, 4:56 PM

## 2015-09-22 LAB — TYPE AND SCREEN
ABO/RH(D): A POS
ANTIBODY SCREEN: NEGATIVE
Unit division: 0
Unit division: 0

## 2015-09-23 ENCOUNTER — Telehealth (HOSPITAL_COMMUNITY): Payer: Self-pay | Admitting: *Deleted

## 2015-09-23 ENCOUNTER — Ambulatory Visit (HOSPITAL_COMMUNITY): Payer: Self-pay

## 2015-09-23 NOTE — Telephone Encounter (Signed)
Message left on patients vm stating that he does not need to come in for IVF today 09/23/15 and that Dr. Whitney Muse had reviewed his labs and that she said they looked great.

## 2015-09-24 NOTE — Progress Notes (Signed)
No primary care provider on file. No primary provider on file.  Cancer of tonsillar fossa ([place]) - Plan: oxyCODONE (ROXICODONE) 5 MG/5ML solution  Mucositis oral - Plan: nystatin (MYCOSTATIN) 100000 UNIT/ML suspension  Oral candidiasis - Plan: nystatin (MYCOSTATIN) 100000 UNIT/ML suspension  CURRENT THERAPY: Concomitant chemoradiation beginning on 08/20/2015 consisting of Cisplatin every 21 days complicated by Cisplatin-induced tinnitus following cycle 1 of treatment leading to a change in therapy to Carboplatin/5FU  INTERVAL HISTORY: Frank Castillo 65 y.o. 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  .   Cancer of tonsillar fossa ([place])   08/02/2015 Initial Diagnosis Cancer of tonsillar fossa ([place])   08/20/2015 Procedure Port-a-cath placed by IR   08/20/2015 Procedure G-tube placed by IR   08/21/2015 -  Radiation Therapy Dr. Isidore Moos   08/21/2015 - 09/10/2015 Chemotherapy Cisplatin every 21 days with XRT.   08/28/2015 Adverse Reaction Tinnitis, Cisplatin-induced.   09/11/2015 -  Chemotherapy Carboplatin/5FU   09/20/2015 - 09/21/2015 Hospital Admission Intractable nausea/vomiting.    I personally reviewed and went over laboratory results with the patient.  The results are noted within this dictation.    His mucositis is improved, but remains.  He notes secretion production has declined, but persists.  He is taking liquids by mouth.  Liquid pain medication has been helpful to "take the edge off" of the pain.    I prescribed mupriocin for his inner thigh folliculitis.  The directions defaulted to application to each nare, but I typed in the directions that he was to apply to his affected area (thighs).  He admits that he was confused (rightfully so), so he has not used it.  This provided a good laugh for everyone.  It now appears more fungal in nature.  He is advised to call on Friday if not better as he  may need Lotrisone cream instead.  He is educated on his impending dose reduction of 5FU for his next cycle of therapy.  Past Medical History  Diagnosis Date  . Tonsillar mass     left  . Portal hypertension ([place])   . Hypertension   . Cirrhosis ([place])     has Cancer of tonsillar fossa ([place]); Hypertension; Cirrhosis ([place]); Portal hypertension ([place]); Nausea with vomiting; Hyponatremia; Oral pharyngeal candidiasis; and Antineoplastic chemotherapy induced anemia on his problem list.     is allergic to bee venom.  Current Outpatient Prescriptions on File Prior to Visit  Medication Sig Dispense Refill  . CARBOPLATIN IV Inject into the vein. Once every 21 days x 3 cycles    . cetirizine (ZYRTEC) 10 MG tablet Take 10 mg by mouth daily.    . cholecalciferol (VITAMIN D) 1000 units tablet Take 1,000 Units by mouth daily.    . Diphenhyd-Hydrocort-Nystatin (FIRST-DUKES MOUTHWASH) SUSP Use as directed 5 mLs in the mouth or throat 4 (four) times daily -  before meals and at bedtime. 240 mL 1  . fentaNYL (DURAGESIC - DOSED MCG/HR) 12 MCG/HR Apply to skin. Change every 72 hours. 10 patch 0  . fluconazole (DIFLUCAN) 10 MG/ML suspension Take 10 mLs (100 mg total) by mouth daily. 300 mL 1  . fluorouracil CALGB 16109 in sodium chloride 0.9 % 150 mL Inject into the vein over 96 hr. X 3 cycles    . guaiFENesin (ROBITUSSIN) 100 MG/5ML SOLN Take 5 mLs (100 mg total) by mouth every 6 (six) hours as needed for cough or to loosen  phlegm. 240 mL 1  . lidocaine (XYLOCAINE) 2 % solution Use as directed 20 mLs in the mouth or throat every 4 (four) hours as needed for mouth pain.   4  . lidocaine-prilocaine (EMLA) cream Apply a quarter size amount to port site 1 hour prior to chemo. Do not rub in. Cover with plastic wrap. 30 g 3  . LORazepam (ATIVAN) 0.5 MG tablet Take 1 tablet (0.5 mg total) by mouth every 4 (four) hours. 30 tablet 0  . mupirocin ointment (BACTROBAN) 2 % Place 1 application into the nose 2 (two) times  daily. (Patient taking differently: Place 1 application into the nose 2 (two) times daily. Apply to rash on thighs and groin) 22 g 1  . nicotine (NICODERM CQ) 14 mg/24hr patch apply 21 mg patch daily x6wk, then 14 mg patch daily x2wk, then 7 mg patch daily x2wk 14 patch 0  . Nutritional Supplements (JEVITY 1.2 CAL/FIBER) LIQD 8 cans/day.  4 feeding of 2 cans each. Flush with 100 mls before and after each feeding 1000 mL 6  . ondansetron (ZOFRAN ODT) 8 MG disintegrating tablet Take 1 tablet (8 mg total) by mouth every 8 (eight) hours as needed for nausea or vomiting. 30 tablet 2  . pantoprazole (PROTONIX) 40 MG tablet Take 1 tablet (40 mg total) by mouth daily. 30 tablet 3  . prochlorperazine (COMPAZINE) 10 MG tablet Take 1 tablet (10 mg total) by mouth every 6 (six) hours as needed for nausea or vomiting. 30 tablet 2  . sodium fluoride (FLUORISHIELD) 1.1 % GEL dental gel Instill one drop of gel per tooth space of fluoride tray. Place over teeth for 5 minutes. Remove. Spit out excess. Repeat nightly. 120 mL prn  . lisinopril-hydrochlorothiazide (PRINZIDE,ZESTORETIC) 20-25 MG tablet Take 1 tablet by mouth daily. Reported on 09/25/2015  11  . Melatonin 10 MG TABS Take 1 tablet by mouth daily as needed (sleep). Reported on 09/25/2015    . nicotine (NICODERM CQ) 21 mg/24hr patch apply 21 mg patch daily x6wk, then 14 mg patch daily x2wk, then 7 mg patch daily x2wk (Patient not taking: Reported on 09/25/2015) 21 patch 1  . nicotine (NICODERM CQ) 7 mg/24hr patch apply 21 mg patch daily x6wk, then 14 mg patch daily x2wk, then 7 mg patch daily x2wk (Patient not taking: Reported on 09/25/2015) 14 patch 0  . sucralfate (CARAFATE) 1 g tablet Reported on 09/25/2015  0  . traMADol (ULTRAM) 50 MG tablet Take 1 tablet (50 mg total) by mouth every 6 (six) hours as needed. (Patient not taking: Reported on 09/25/2015) 60 tablet 1   No current facility-administered medications on file prior to visit.    Past Surgical History    Procedure Laterality Date  . Hernia repair Right   . Mouth surgery    . Tonsillectomy Left 07/09/2015    Procedure: LEFT TONSILLECTOMY;  Surgeon: Leta Baptist, MD;  Location: [facility];  Service: ENT;  Laterality: Left;    Denies any headaches, dizziness, double vision, fevers, chills, night sweats,, vomiting, diarrhea, constipation, chest pain, heart palpitations, shortness of breath, blood in stool, black tarry stool, urinary pain, urinary burning, urinary frequency, hematuria.   PHYSICAL EXAMINATION  ECOG PERFORMANCE STATUS: 2 - Symptomatic, <50% confined to bed  Filed Vitals:   09/25/15 1123  BP: 96/59  Pulse: 83  Temp: 98.3 F (36.8 C)  Resp: 18    GENERAL:alert, cooperative, in chemo-recliner receiving IV fluids, sister at the bedside. SKIN: positive for: erythematous  cervical neck bilaterally with desquamation. HEAD: Normocephalic, as described above. EYES: normal, Conjunctiva are pink and non-injected EARS: External ears normal OROPHARYNX:thrush and mucositis (improved) with thick secretions. NECK: bilateral neck erythema with some desquamation LYMPH:  not examined BREAST:not examined LUNGS: clear to auscultation  HEART: regular rate & rhythm ABDOMEN:abdomen soft and normal bowel sounds BACK: Back symmetric, no curvature. EXTREMITIES:less then 2 second capillary refill, no joint deformities, effusion, or inflammation, no skin discoloration, bilateral inguinal rash appearing   NEURO: no focal motor/sensory deficits, gait normal   LABORATORY DATA: CBC    Component Value Date/Time   WBC 6.8 09/21/2015 0549   WBC 9.0 10/21/2011 1614   RBC 3.53* 09/21/2015 0549   RBC 3.42* 10/21/2011 1614   HGB 10.4* 09/21/2015 0549   HGB 11.5* 10/21/2011 1614   HCT 30.4* 09/21/2015 0549   HCT 34.6* 10/21/2011 1614   PLT 139* 09/21/2015 0549   PLT 209 10/21/2011 1614   MCV 86.1 09/21/2015 0549   MCV 101* 10/21/2011 1614   MCH 29.5 09/21/2015 0549   MCH 33.7  10/21/2011 1614   MCHC 34.2 09/21/2015 0549   MCHC 33.3 10/21/2011 1614   RDW 14.4 09/21/2015 0549   RDW 14.0 10/21/2011 1614   LYMPHSABS 0.5* 09/21/2015 0549   LYMPHSABS 1.9 10/20/2011 0609   MONOABS 0.3 09/21/2015 0549   MONOABS 1.1* 10/20/2011 0609   EOSABS 0.0 09/21/2015 0549   EOSABS 0.1 10/20/2011 0609   BASOSABS 0.0 09/21/2015 0549   BASOSABS 0.1 10/20/2011 0609      Chemistry      Component Value Date/Time   NA 132* 09/21/2015 0549   NA 132* 10/21/2011 1614   K 4.5 09/21/2015 0549   K 3.6 10/21/2011 1614   CL 101 09/21/2015 0549   CL 92* 10/21/2011 1614   CO2 24 09/21/2015 0549   CO2 32 10/21/2011 1614   BUN 20 09/21/2015 0549   BUN 7 10/21/2011 1614   CREATININE 0.67 09/21/2015 0549   CREATININE 0.73 10/21/2011 1614      Component Value Date/Time   CALCIUM 8.7* 09/21/2015 0549   CALCIUM 7.9* 10/21/2011 1614   ALKPHOS 36* 09/21/2015 0549   ALKPHOS 83 10/21/2011 1614   AST 14* 09/21/2015 0549   AST 97* 10/21/2011 1614   ALT 11* 09/21/2015 0549   ALT 34 10/21/2011 1614   BILITOT 0.6 09/21/2015 0549   BILITOT 1.1* 10/21/2011 1614        PENDING LABS:   RADIOGRAPHIC STUDIES:  No results found.   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  Oncology history is updated.  Will continue with IV fluids three times weekly.  Blood pressure today is 96/59. I've asked him to hold his lisinopril/HCTZ. He is asymptomatic. He will receive IV fluids today as planned.  He requests a refill on the following medications:  Liq Oxycodone  Nystatin  He is using Silvadene cream to neck as ordered by Rad Onc.  He will start using mupirocin for a folliculitis of his groins. If not better by Friday, I will change it to  Lotrisone as is beginning to look like a potential fungal infection.  His 5FU is dose reduced by 20% for cycle #2.  He'll return as scheduled on 5/8 for follow-up as well.    THERAPY PLAN:  Continue  treatment as planned with curative intent.  All questions were answered. The patient knows to call the clinic with any problems, questions or concerns. We can certainly see the patient much sooner if necessary.  Patient and plan discussed with Dr. Ancil Linsey and she is in agreement with the aforementioned.   This note is electronically signed by: Doy Mince 09/25/2015 2:11 PM

## 2015-09-24 NOTE — Assessment & Plan Note (Addendum)
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  Oncology history is updated.  Will continue with IV fluids three times weekly.  Blood pressure today is 96/59. I've asked him to hold his lisinopril/HCTZ. He is asymptomatic. He will receive IV fluids today as planned.  He requests a refill on the following medications:  Liq Oxycodone  Nystatin  He is using Silvadene cream to neck as ordered by Rad Onc.  He will start using mupirocin for a folliculitis of his groins. If not better by Friday, I will change it to Lotrisone as is beginning to look like a potential fungal infection.  His 5FU is dose reduced by 20% for cycle #2.  He'll return as scheduled on 5/8 for follow-up as well.

## 2015-09-25 ENCOUNTER — Ambulatory Visit (HOSPITAL_COMMUNITY): Payer: Self-pay | Admitting: Hematology & Oncology

## 2015-09-25 ENCOUNTER — Encounter (HOSPITAL_BASED_OUTPATIENT_CLINIC_OR_DEPARTMENT_OTHER): Payer: BLUE CROSS/BLUE SHIELD

## 2015-09-25 ENCOUNTER — Encounter (HOSPITAL_COMMUNITY): Payer: Self-pay | Admitting: Speech Pathology

## 2015-09-25 ENCOUNTER — Ambulatory Visit (HOSPITAL_COMMUNITY): Payer: Self-pay

## 2015-09-25 ENCOUNTER — Encounter (HOSPITAL_COMMUNITY): Payer: BLUE CROSS/BLUE SHIELD | Attending: Oncology | Admitting: Oncology

## 2015-09-25 ENCOUNTER — Ambulatory Visit (HOSPITAL_COMMUNITY): Payer: BLUE CROSS/BLUE SHIELD | Attending: Hematology & Oncology | Admitting: Speech Pathology

## 2015-09-25 VITALS — BP 96/59 | HR 83 | Temp 98.3°F | Resp 18 | Wt 170.0 lb

## 2015-09-25 VITALS — BP 104/64 | HR 80 | Temp 98.4°F | Resp 18

## 2015-09-25 DIAGNOSIS — K1230 Oral mucositis (ulcerative), unspecified: Secondary | ICD-10-CM

## 2015-09-25 DIAGNOSIS — B370 Candidal stomatitis: Secondary | ICD-10-CM

## 2015-09-25 DIAGNOSIS — Z95828 Presence of other vascular implants and grafts: Secondary | ICD-10-CM | POA: Insufficient documentation

## 2015-09-25 DIAGNOSIS — C090 Malignant neoplasm of tonsillar fossa: Secondary | ICD-10-CM | POA: Insufficient documentation

## 2015-09-25 DIAGNOSIS — R1312 Dysphagia, oropharyngeal phase: Secondary | ICD-10-CM

## 2015-09-25 DIAGNOSIS — C099 Malignant neoplasm of tonsil, unspecified: Secondary | ICD-10-CM | POA: Insufficient documentation

## 2015-09-25 DIAGNOSIS — L739 Follicular disorder, unspecified: Secondary | ICD-10-CM | POA: Insufficient documentation

## 2015-09-25 DIAGNOSIS — R112 Nausea with vomiting, unspecified: Secondary | ICD-10-CM | POA: Insufficient documentation

## 2015-09-25 DIAGNOSIS — R05 Cough: Secondary | ICD-10-CM | POA: Insufficient documentation

## 2015-09-25 MED ORDER — SODIUM CHLORIDE 0.9 % IV SOLN
Freq: Once | INTRAVENOUS | Status: AC
  Administered 2015-09-25: 11:00:00 via INTRAVENOUS

## 2015-09-25 MED ORDER — HEPARIN SOD (PORK) LOCK FLUSH 100 UNIT/ML IV SOLN
500.0000 [IU] | Freq: Once | INTRAVENOUS | Status: AC
  Administered 2015-09-25: 500 [IU] via INTRAVENOUS
  Filled 2015-09-25: qty 5

## 2015-09-25 MED ORDER — NYSTATIN 100000 UNIT/ML MT SUSP: 5.0000 mL | Freq: Four times a day (QID) | OROMUCOSAL | Status: DC

## 2015-09-25 MED ORDER — OXYCODONE HCL 5 MG/5ML PO SOLN: 5.0000 mg | ORAL | Status: DC | PRN

## 2015-09-25 NOTE — Patient Instructions (Signed)
[place] at [facility] Discharge Instructions  RECOMMENDATIONS MADE BY THE CONSULTANT AND ANY TEST RESULTS WILL BE SENT TO YOUR REFERRING PHYSICIAN.  Today you received one liter of normal saline intravenously.  Thank you for choosing [place] at [facility] to provide your oncology and hematology care.  To afford each patient quality time with our provider, please arrive at least 15 minutes before your scheduled appointment time.   Beginning January 23rd 2017 lab work for the [facility] will be done in the  Main lab at [facility] on 1st floor. If you have a lab appointment with the [facility] please come in thru the  Main Entrance and check in at the main information desk  You need to re-schedule your appointment should you arrive 10 or more minutes late.  We strive to give you quality time with our providers, and arriving late affects you and other patients whose appointments are after yours.  Also, if you no show three or more times for appointments you may be dismissed from the clinic at the providers discretion.     Again, thank you for choosing [facility].  Our hope is that these requests will decrease the amount of time that you wait before being seen by our physicians.       _____________________________________________________________  Should you have questions after your visit to [facility], please contact our office at [phone] between the hours of 8:30 a.m. and 4:30 p.m.  Voicemails left after 4:30 p.m. will not be returned until the following business day.  For prescription refill requests, have your pharmacy contact our office.         Resources For Cancer Patients and their Caregivers ? American Cancer Society: Can assist with transportation, wigs, general needs, runs Look Good Feel Better.        ([phone] 039-3462 ? Cancer Care: Provides financial assistance, online support groups,  medication/co-pay assistance.  1-800-813-HOPE (612) 556-1338) ? [place] Assists [place] cancer patients and their families through emotional , educational and financial support.  7340679650 ? [place] DSS Where to apply for food stamps, Medicaid and utility assistance. 408-838-5328 ? [facility]: Transportation to medical appointments. 269-444-5840 ? Social Security Administration: May apply for disability if have a Stage IV cancer. 484-019-6212 8724404976 ? [employer], Disability and Transit Services: Assists with nutrition, care and transit needs. 417-497-4120

## 2015-09-25 NOTE — Patient Instructions (Signed)
[place] at [facility] Discharge Instructions  RECOMMENDATIONS MADE BY THE CONSULTANT AND ANY TEST RESULTS WILL BE SENT TO YOUR REFERRING PHYSICIAN.  Exam per T. Kefalas PA-C Do not take lisinopril - blood pressure low Refills on nystatin and pain medicine  Thank you for choosing [place] at [facility] to provide your oncology and hematology care.  To afford each patient quality time with our provider, please arrive at least 15 minutes before your scheduled appointment time.   Beginning January 23rd 2017 lab work for the [facility] will be done in the  Main lab at [facility] on 1st floor. If you have a lab appointment with the House please come in thru the  Main Entrance and check in at the main information desk  You need to re-schedule your appointment should you arrive 10 or more minutes late.  We strive to give you quality time with our providers, and arriving late affects you and other patients whose appointments are after yours.  Also, if you no show three or more times for appointments you may be dismissed from the clinic at the providers discretion.     Again, thank you for choosing [facility].  Our hope is that these requests will decrease the amount of time that you wait before being seen by our physicians.       _____________________________________________________________  Should you have questions after your visit to [facility], please contact our office at [phone] between the hours of 8:30 a.m. and 4:30 p.m.  Voicemails left after 4:30 p.m. will not be returned until the following business day.  For prescription refill requests, have your pharmacy contact our office.         Resources For Cancer Patients and their Caregivers ? American Cancer Society: Can assist with transportation, wigs, general needs, runs Look Good Feel Better.        [phone] ? Cancer Care: Provides  financial assistance, online support groups, medication/co-pay assistance.  1-800-813-HOPE (562)299-3452) ? [place] Assists [place] cancer patients and their families through emotional , educational and financial support.  631-468-1689 ? [place] DSS Where to apply for food stamps, Medicaid and utility assistance. 276-270-6060 ? [employer]: Transportation to medical appointments. 951-813-8134 ? Social Security Administration: May apply for disability if have a Stage IV cancer. 317 867 1430 934-803-4161 ? [employer], Disability and Transit Services: Assists with nutrition, care and transit needs. 713-254-4959

## 2015-09-25 NOTE — Therapy (Signed)
[place], [place], [address] Phone: [phone]   Fax:  [phone]  Speech Language Pathology Evaluation  Patient Details  Name: Frank Castillo MRN: [id number] Date of Birth: 1951/05/03 No Data Recorded  Encounter Date: 09/25/2015      End of Session - 09/25/15 1032    Visit Number 1   Number of Visits 6   Authorization Type BCBS   SLP Start Time [phone]   SLP Stop Time  1000   SLP Time Calculation (min) 56 min   Activity Tolerance Patient tolerated treatment well      Past Medical History  Diagnosis Date  . Tonsillar mass     left  . Portal hypertension ([place])   . Hypertension   . Cirrhosis [facility])     Past Surgical History  Procedure Laterality Date  . Hernia repair Right   . Mouth surgery    . Tonsillectomy Left 07/09/2015    Procedure: LEFT TONSILLECTOMY;  Surgeon: Leta Baptist, MD;  Location: [place];  Service: ENT;  Laterality: Left;    There were no vitals filed for this visit.      Subjective Assessment - 09/25/15 1028    Subjective "It is painful to swallow."   Patient is accompained by: Family member  Sister from [place]   Currently in Pain? No/denies       Cancer of tonsillar fossa ([place])     08/02/2015  Initial Diagnosis  Cancer of tonsillar fossa ([place])     08/20/2015  Procedure  Port-a-cath placed by IR     08/20/2015  Procedure  G-tube placed by IR     08/21/2015 -   Radiation Therapy  Dr. Isidore Moos     08/21/2015 - 09/10/2015  Chemotherapy  Cisplatin every 21 days with XRT.     08/28/2015  Adverse Reaction  Tinnitis, Cisplatin-induced.     09/11/2015 -   Chemotherapy  Carboplatin/5FU     09/20/2015 - 09/21/2015  Hospital Admission  Intractable nausea/vomiting.               Prior Functional Status - 09/25/15 1030    Prior Functional Status   Cognitive/Linguistic Baseline Within functional limits   Type of Home House    Lives With Alone   Available Help at Discharge  Family         General - 09/25/15 1030    General Information   Date of Onset 07/09/15  tonsillectomy   HPI Frank Castillo 65 y.o. male referred by Dr. 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 Pt with mucositis oral and oral candidiasis and treated with nystatin (MYCOSTATIN) 100000 UNIT/ML suspension. Pt will continue with IV fluids three times weekly. Left tonsillectomy 07/09/2015 and PEG placed 08/20/2015. Pt anticipates that chemo will conclude ~10/22/2015 and radiation ~10/09/2015. Pt referred for swallowing evaluation due to risk of aspiration and malnutrition in setting of head and neck cancer treatment.    Type of Study Bedside Swallow Evaluation   Diet Prior to this Study PEG tube;Regular;Thin liquids   Temperature Spikes Noted No   Respiratory Status Room air   History of Recent Intubation No   Behavior/Cognition Alert;Cooperative;Pleasant mood   Oral Cavity Assessment Edema;Lesions;Erythema   Oral Care Completed by SLP No   Oral Cavity - Dentition Adequate natural dentition  Vision Functional for self-feeding   Self-Feeding Abilities Able to feed self   Patient Positioning Upright in chair   Baseline Vocal Quality Normal;Hoarse  mild hoarseness   Volitional Cough Strong   Volitional Swallow Able to elicit          Oral Motor/Sensory Function - 09/25/15 1242    Oral Motor/Sensory Function   Overall Oral Motor/Sensory Function Within functional limits  See above; lingual and palatal ulcerations         Ice Chips - 09/25/15 1242    Ice Chips   Ice chips Not tested         Thin Liquid - 09/25/15 1243    Thin Liquid   Thin Liquid Within functional limits   Presentation Cup;Self Fed   Other Comments effortful swallows         Nectar thick  liquid - 09/25/15 1243    Nectar Thick Liquid   Nectar Thick Liquid Not tested         Honey Thick Liquid - 09/25/15 1243    Honey Thick Liquid   Honey Thick Liquid Not tested         Puree - 09/25/15 1243    Puree   Puree Within functional limits   Presentation Self Fed;Spoon         Solid - 09/25/15 1243    Solid   Solid Not tested   Other Comments lingual and palatal ulcerations make swallowing solids painful-deferred at this time           SLP Education - 09/25/15 1029    Education provided Yes   Education Details dysphagia therapy and swallowing exercises; radiation sequelae   Person(s) Educated Patient;Other (comment)  Sister   Methods Explanation;Demonstration;Verbal cues;Handout   Comprehension Verbalized understanding          SLP Short Term Goals - 09/25/15 1034    SLP SHORT TERM GOAL #1   Title Pt will complete swallowing exercises 3x/day 7 days a week with use of written cues and pt/caregiver report.   Baseline Not completing   Time 2   Period Months   Status New   SLP SHORT TERM GOAL #2   Title Pt will increase oral intake of all consistencies and textures to regular/thin for 3 small meals a day to decrease dependency of PEG and facilitate safe swallow function going forward.    Baseline Only sipping liquids; uses 6-8 cans of Jevity a day via PEG   Time 2   Status New          SLP Long Term Goals - 09/25/15 1244    SLP LONG TERM GOAL #1   Title Pt will demonstrate safe and efficient consumption of self regulated regular textures with use of compensatory strategies as needed.   Baseline Pt only drinking liquids at this time due to odynophagia   Time 2   Period Months   Status New          Plan - 09/25/15 1033    Clinical Impression Statement Frank Castillo currently tolerates thin liquids and pureed solids. Oral motor assessment revealed reduced lingual ROM and reduced lingual strength although likely negatively impacted by  mucositis. Labial ROM was [facility] and strength was [facility]. Velar ROM appeared [facility]. POs: Pt consumed water and applesauce without overt s/s aspiration. Because data states the risk for dysphagia during and after radiation treatment is high due to undergoing radiation tx, SLP taught pt about the possibility of reduced/limited ability for PO  intake during rad tx. SLP encouraged pt to continue swallowing POs as far into rad tx as possible, even ingesting POs and/or completing HEP shortly after administration of pain meds. SLP educated pt re: changes to swallowing musculature after rad tx, and why adherence to dysphagia HEP provided today and PO consumption was necessary to inhibit muscular disuse atrophy and to reduce muscle fibrosis following rad tx. Pt demonstrated understanding of these things to SLP. Further education was provided re: xerostomia, mucositis/esophagitis, and late effects head/neck radiation. After evaluation, SLP then developed a HEP for pt and pt was instructed how to perform exercises involving lingual, vocal, and pharyngeal strengthening. SLP performed each exercise and pt return demonstrated each exercise. SLP ensured pt performance was correct prior to moving on to next exercise. Pt was instructed to complete this program 3 times a day, 6-7 days/week until 60 days after their last rad tx, then x3 a week after that.   He is only taking a little by mouth at this time due to posterior oropharyngeal erythema and mucositis. He was encouraged to sip water as much as possible until oral cavity heals. He will return visit in ~2 weeks. Frank Castillo was also encouraged to keep a "food journal" to document oral intake and rank items that are most appealing and/or least offensive to him at this time. His sister reported that she will help him with this.    Speech Therapy Frequency Biweekly   Duration --  6 visits total every 2 weeks   Treatment/Interventions Aspiration precaution training;Pharyngeal  strengthening exercises;Diet toleration management by SLP;SLP instruction and feedback;Compensatory strategies;Patient/family education   Potential to Achieve Goals Good   SLP Home Exercise Plan Pt will be independent with HEP as assigned to facilitate carryover of treatment strategies at home   Consulted and Agree with Plan of Care Patient      Patient will benefit from skilled therapeutic intervention in order to improve the following deficits and impairments:   Dysphagia, oropharyngeal phase    Problem List Patient Active Problem List   Diagnosis Date Noted  . Nausea with vomiting 09/20/2015  . Hyponatremia 09/20/2015  . Oral pharyngeal candidiasis 09/20/2015  . Antineoplastic chemotherapy induced anemia 09/20/2015  . Hypertension   . Cirrhosis ([place])   . Portal hypertension ([place])   . Cancer of tonsillar fossa [facility]) 08/02/2015   Thank you,  Genene Churn, [place]  [facility] 09/25/2015, 12:57 PM  [place] [address] [place], [place], [address] Phone: [phone]   Fax:  [phone]  Name: Frank Castillo MRN: [id number] Date of Birth: Jun 09, 1950

## 2015-09-26 ENCOUNTER — Other Ambulatory Visit (HOSPITAL_COMMUNITY): Payer: Self-pay | Admitting: *Deleted

## 2015-09-26 DIAGNOSIS — C090 Malignant neoplasm of tonsillar fossa: Secondary | ICD-10-CM

## 2015-09-26 MED ORDER — LORAZEPAM 0.5 MG PO TABS: 0.5000 mg | ORAL_TABLET | ORAL | Status: DC

## 2015-09-27 ENCOUNTER — Encounter (HOSPITAL_BASED_OUTPATIENT_CLINIC_OR_DEPARTMENT_OTHER): Payer: BLUE CROSS/BLUE SHIELD

## 2015-09-27 ENCOUNTER — Ambulatory Visit (HOSPITAL_COMMUNITY): Payer: Self-pay

## 2015-09-27 VITALS — BP 109/67 | HR 98 | Temp 98.0°F | Resp 18 | Wt 171.8 lb

## 2015-09-27 DIAGNOSIS — K1230 Oral mucositis (ulcerative), unspecified: Secondary | ICD-10-CM | POA: Diagnosis not present

## 2015-09-27 DIAGNOSIS — R300 Dysuria: Secondary | ICD-10-CM

## 2015-09-27 DIAGNOSIS — B370 Candidal stomatitis: Secondary | ICD-10-CM | POA: Diagnosis present

## 2015-09-27 DIAGNOSIS — L739 Follicular disorder, unspecified: Secondary | ICD-10-CM | POA: Diagnosis present

## 2015-09-27 DIAGNOSIS — R112 Nausea with vomiting, unspecified: Secondary | ICD-10-CM

## 2015-09-27 DIAGNOSIS — R05 Cough: Secondary | ICD-10-CM | POA: Diagnosis present

## 2015-09-27 DIAGNOSIS — C099 Malignant neoplasm of tonsil, unspecified: Secondary | ICD-10-CM | POA: Diagnosis present

## 2015-09-27 DIAGNOSIS — Z95828 Presence of other vascular implants and grafts: Secondary | ICD-10-CM | POA: Diagnosis present

## 2015-09-27 DIAGNOSIS — C090 Malignant neoplasm of tonsillar fossa: Secondary | ICD-10-CM | POA: Diagnosis present

## 2015-09-27 LAB — URINALYSIS, ROUTINE W REFLEX MICROSCOPIC
Bilirubin Urine: NEGATIVE
GLUCOSE, UA: NEGATIVE mg/dL
HGB URINE DIPSTICK: NEGATIVE
KETONES UR: NEGATIVE mg/dL
LEUKOCYTES UA: NEGATIVE
Nitrite: NEGATIVE
PROTEIN: NEGATIVE mg/dL
Specific Gravity, Urine: 1.01 (ref 1.005–1.030)
pH: 7 (ref 5.0–8.0)

## 2015-09-27 MED ORDER — PALONOSETRON HCL INJECTION 0.25 MG/5ML
0.2500 mg | Freq: Once | INTRAVENOUS | Status: AC
  Administered 2015-09-27: 0.25 mg via INTRAVENOUS
  Filled 2015-09-27: qty 5

## 2015-09-27 MED ORDER — SODIUM CHLORIDE 0.9 % IV SOLN
Freq: Once | INTRAVENOUS | Status: AC
  Administered 2015-09-27: 10:00:00 via INTRAVENOUS

## 2015-09-27 MED ORDER — SODIUM CHLORIDE 0.9 % IV SOLN
10.0000 mg | Freq: Once | INTRAVENOUS | Status: AC
  Administered 2015-09-27: 10 mg via INTRAVENOUS
  Filled 2015-09-27: qty 1

## 2015-09-27 MED ORDER — HEPARIN SOD (PORK) LOCK FLUSH 100 UNIT/ML IV SOLN
500.0000 [IU] | Freq: Once | INTRAVENOUS | Status: AC
  Administered 2015-09-27: 500 [IU] via INTRAVENOUS

## 2015-09-27 MED ORDER — LORAZEPAM 2 MG/ML IJ SOLN
0.5000 mg | Freq: Once | INTRAMUSCULAR | Status: AC
  Administered 2015-09-27: 0.5 mg via INTRAVENOUS
  Filled 2015-09-27: qty 1

## 2015-09-27 NOTE — Progress Notes (Signed)
.  Frank Castillo arrives today for fluids and reports that he has nausea and burning on urination. Orders received. Tolerated infusion well and with reported relief.

## 2015-09-30 ENCOUNTER — Ambulatory Visit (HOSPITAL_COMMUNITY): Payer: Self-pay

## 2015-09-30 ENCOUNTER — Encounter (HOSPITAL_BASED_OUTPATIENT_CLINIC_OR_DEPARTMENT_OTHER): Payer: BLUE CROSS/BLUE SHIELD | Admitting: Hematology & Oncology

## 2015-09-30 ENCOUNTER — Encounter (HOSPITAL_COMMUNITY): Payer: Self-pay | Admitting: Hematology & Oncology

## 2015-09-30 ENCOUNTER — Encounter (HOSPITAL_BASED_OUTPATIENT_CLINIC_OR_DEPARTMENT_OTHER): Payer: BLUE CROSS/BLUE SHIELD

## 2015-09-30 ENCOUNTER — Inpatient Hospital Stay (HOSPITAL_COMMUNITY): Payer: Self-pay

## 2015-09-30 VITALS — BP 123/68 | HR 97 | Temp 98.0°F | Resp 18 | Wt 170.0 lb

## 2015-09-30 VITALS — BP 109/64 | HR 80 | Temp 97.3°F | Resp 18

## 2015-09-30 DIAGNOSIS — Z5111 Encounter for antineoplastic chemotherapy: Secondary | ICD-10-CM

## 2015-09-30 DIAGNOSIS — IMO0002 Reserved for concepts with insufficient information to code with codable children: Secondary | ICD-10-CM

## 2015-09-30 DIAGNOSIS — R197 Diarrhea, unspecified: Secondary | ICD-10-CM

## 2015-09-30 DIAGNOSIS — R112 Nausea with vomiting, unspecified: Secondary | ICD-10-CM | POA: Diagnosis not present

## 2015-09-30 DIAGNOSIS — K1230 Oral mucositis (ulcerative), unspecified: Secondary | ICD-10-CM

## 2015-09-30 DIAGNOSIS — C090 Malignant neoplasm of tonsillar fossa: Secondary | ICD-10-CM

## 2015-09-30 DIAGNOSIS — G893 Neoplasm related pain (acute) (chronic): Secondary | ICD-10-CM | POA: Diagnosis not present

## 2015-09-30 DIAGNOSIS — C099 Malignant neoplasm of tonsil, unspecified: Secondary | ICD-10-CM

## 2015-09-30 DIAGNOSIS — R110 Nausea: Secondary | ICD-10-CM

## 2015-09-30 LAB — COMPREHENSIVE METABOLIC PANEL
ALT: 11 U/L — ABNORMAL LOW (ref 17–63)
ANION GAP: 8 (ref 5–15)
AST: 11 U/L — ABNORMAL LOW (ref 15–41)
Albumin: 3.3 g/dL — ABNORMAL LOW (ref 3.5–5.0)
Alkaline Phosphatase: 39 U/L (ref 38–126)
BUN: 20 mg/dL (ref 6–20)
CHLORIDE: 99 mmol/L — AB (ref 101–111)
CO2: 27 mmol/L (ref 22–32)
Calcium: 8.9 mg/dL (ref 8.9–10.3)
Creatinine, Ser: 0.86 mg/dL (ref 0.61–1.24)
Glucose, Bld: 148 mg/dL — ABNORMAL HIGH (ref 65–99)
POTASSIUM: 4.3 mmol/L (ref 3.5–5.1)
SODIUM: 134 mmol/L — AB (ref 135–145)
Total Bilirubin: 0.3 mg/dL (ref 0.3–1.2)
Total Protein: 6.5 g/dL (ref 6.5–8.1)

## 2015-09-30 LAB — CBC WITH DIFFERENTIAL/PLATELET
Basophils Absolute: 0 10*3/uL (ref 0.0–0.1)
Basophils Relative: 0 %
EOS ABS: 0.1 10*3/uL (ref 0.0–0.7)
EOS PCT: 1 %
HCT: 32.3 % — ABNORMAL LOW (ref 39.0–52.0)
Hemoglobin: 10.9 g/dL — ABNORMAL LOW (ref 13.0–17.0)
LYMPHS ABS: 0.6 10*3/uL — AB (ref 0.7–4.0)
Lymphocytes Relative: 13 %
MCH: 30.4 pg (ref 26.0–34.0)
MCHC: 33.7 g/dL (ref 30.0–36.0)
MCV: 90.2 fL (ref 78.0–100.0)
MONO ABS: 0.3 10*3/uL (ref 0.1–1.0)
MONOS PCT: 8 %
NEUTROS ABS: 3.3 10*3/uL (ref 1.7–7.7)
Neutrophils Relative %: 78 %
Platelets: 151 10*3/uL (ref 150–400)
RBC: 3.58 MIL/uL — ABNORMAL LOW (ref 4.22–5.81)
RDW: 16 % — AB (ref 11.5–15.5)
WBC: 4.2 10*3/uL (ref 4.0–10.5)

## 2015-09-30 LAB — MAGNESIUM: MAGNESIUM: 1.9 mg/dL (ref 1.7–2.4)

## 2015-09-30 MED ORDER — PALONOSETRON HCL INJECTION 0.25 MG/5ML
INTRAVENOUS | Status: AC
  Filled 2015-09-30: qty 5

## 2015-09-30 MED ORDER — SODIUM CHLORIDE 0.9 % IV SOLN
Freq: Once | INTRAVENOUS | Status: AC
  Administered 2015-09-30: 11:00:00 via INTRAVENOUS
  Filled 2015-09-30: qty 5

## 2015-09-30 MED ORDER — SODIUM CHLORIDE 0.9 % IV SOLN
627.0000 mg | Freq: Once | INTRAVENOUS | Status: AC
  Administered 2015-09-30: 630 mg via INTRAVENOUS
  Filled 2015-09-30: qty 63

## 2015-09-30 MED ORDER — FENTANYL 12 MCG/HR TD PT72
12.5000 ug | MEDICATED_PATCH | TRANSDERMAL | Status: DC
  Administered 2015-09-30: 12.5 ug via TRANSDERMAL
  Filled 2015-09-30: qty 1

## 2015-09-30 MED ORDER — SODIUM CHLORIDE 0.9 % IV SOLN
500.0000 mg/m2/d | INTRAVENOUS | Status: DC
  Administered 2015-09-30: 3950 mg via INTRAVENOUS
  Filled 2015-09-30: qty 79

## 2015-09-30 MED ORDER — SODIUM CHLORIDE 0.9 % IV SOLN
Freq: Once | INTRAVENOUS | Status: AC
  Administered 2015-09-30: 10:00:00 via INTRAVENOUS

## 2015-09-30 MED ORDER — PALONOSETRON HCL INJECTION 0.25 MG/5ML
0.2500 mg | Freq: Once | INTRAVENOUS | Status: AC
  Administered 2015-09-30: 0.25 mg via INTRAVENOUS

## 2015-09-30 MED ORDER — FENTANYL 25 MCG/HR TD PT72: 25.0000 ug | MEDICATED_PATCH | TRANSDERMAL | Status: DC

## 2015-09-30 MED ORDER — OXYCODONE HCL 5 MG/5ML PO SOLN: 5.0000 mg | ORAL | Status: DC | PRN

## 2015-09-30 MED ORDER — HEPARIN SOD (PORK) LOCK FLUSH 100 UNIT/ML IV SOLN: 500.0000 [IU] | Freq: Once | INTRAVENOUS | Status: DC | PRN

## 2015-09-30 MED ORDER — SODIUM CHLORIDE 0.9% FLUSH: 10.0000 mL | INTRAVENOUS | Status: DC | PRN

## 2015-09-30 NOTE — Progress Notes (Signed)
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 He is seen at [facility], Hepatitis C negative CT neck with contrast 06/25/2015 at [place] level 2 malignant LAD, largest node is 3.5 cm long axis. R worse than L carotid atherosclerosis and stenosis. CT chest with no acute or significant findings within the chest    Cancer of tonsillar fossa ([place])   08/02/2015 Initial Diagnosis Cancer of tonsillar fossa ([place])   08/20/2015 Procedure Port-a-cath placed by IR   08/20/2015 Procedure G-tube placed by IR   08/21/2015 -  Radiation Therapy Dr. Isidore Castillo   08/21/2015 - 09/10/2015 Chemotherapy Cisplatin every 21 days with XRT.   08/28/2015 Adverse Reaction Tinnitis, Cisplatin-induced.   09/11/2015 -  Chemotherapy Carboplatin/5FU   09/20/2015 - 09/21/2015 Hospital Admission Intractable nausea/vomiting. Severe Mucositis    HISTORY OF PRESENTING ILLNESS:  Frank Castillo 65 y.o. male is here for further follow-up of Stage IVA T2N2bM0 squamous cell Ca of the L tonsil. Left tonsillectomy performed on 07/09/2015 with Dr. Benjamine Castillo.  [name] is accompanied by his sister. He is here for Cycle #2 Carboplatin / 5FU today. This is his last cycle of chemotherapy. He has 8 XRT left.   His mouth is much improved compared to a week and a half ago, though it is still painful. He has been to the swallowing therapist. The pain is mostly on the sides of his tongue and he experiences discomfort when moving his tongue.   He used aloe and aquaphor on his neck. He has not put silvadene on his neck this morning as he has radiation treatment after our follow up today.  He rates his pain as a 6 or 7 out of 10.  He is uncomfortable using the oxycodone every four hours to "knock down" the pain. He does not notice relief with his 12 mcg fentanyl patch. He last took oxycodone this morning at 8 am.   He wakes up from mouth pain, thick mucus, and heavy sweating.  He currently swishes his mouth with soda. He does not rinse with salt water or add salt to the soda as he notes this causes too much pain.   Reports diarrhea yesterday. He was feeling constipated for a few days and was taking stool softener twice a day. The day before yesterday, he took Miralax which did not resolve the constipation so he took another half dose. After this next dose, he used the bathroom more times than he counted. He would stand to leave the bathroom and realize he shouldn't. He then followed the imodium care sheet. Before yesterday, he had no diarrhea and denies blood in stool.   Reports nausea and weakness today. He does not experience nausea daily. The feeling today is abnormal. He feels as though he is going to doze off. His sister notes he usually does doze some through the day, however this weekend he had full active days with company at the house. His sister says there were times he was laughing and smiling, appearing as he did 3 weeks ago.   He continues to smoke, while decreasing the amount. It took him a week to finish a pack of cigarettes. He "knows" he needs to completely quit.   Denies acid reflux or heart  burn. He is taking protonix daily. He continues to use magic mouthwash prn.    MEDICAL HISTORY:  Past Medical History  Diagnosis Date  . Tonsillar mass     left  . Portal hypertension ([place])   . Hypertension   . Cirrhosis ([place])     SURGICAL HISTORY: Past Surgical History  Procedure Laterality Date  . Hernia repair Right   . Mouth surgery    . Tonsillectomy Left 07/09/2015    Procedure: LEFT TONSILLECTOMY;  Surgeon: Leta Baptist, MD;  Location: [facility];  Service: ENT;  Laterality: Left;     SOCIAL HISTORY: Social History   Social History  . Marital Status: Single    Spouse Name: N/A  . Number of Children: 0  . Years of Education: N/A   Occupational History  . Not on file.   Social History Main Topics  . Smoking status: Current Every Day Smoker -- 0.50 packs/day    Types: Cigarettes  . Smokeless tobacco: Never Used  . Alcohol Use: No     Comment: former abuse- quit 09/2011  . Drug Use: No  . Sexual Activity: Not on file   Other Topics Concern  . Not on file   Social History Narrative   Divorced.   No children.   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   Currently smoking approximately one pack per day.   Has been smoking off/on since age 35.   Divorced No children. Just step-children. 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 Previously worked in [employer]. Originally from [place], [place]. Current smoker at 1 ppd. Began smoking at 35-17 yo, regularly smoking at 32-21 yo. During his "cocaine years" he smoked about 2 ppd. ETOH, recovering alcoholic. 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 He enjoys reading. 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  FAMILY HISTORY: Family History  Problem Relation Age of Onset  . Macular degeneration Mother   . Alcoholism Father    indicated that his mother is alive. He indicated that his father is deceased.   Mother still living at 69 years-old, with macular degeneration. She has no other health issues. Father deceased. Parents divorced at 38 years-old, so he is not sure what he died from. Father was an alcoholic. 4 sisters. 1 sister died of suicide. 1 sister was adopted. 1 Sister lives in [place]. 1 Sister lives in [place].  ALLERGIES:  is allergic to bee venom.  MEDICATIONS:  Current Outpatient Prescriptions  Medication Sig Dispense Refill  . CARBOPLATIN IV Inject into the vein. Once every 21 days x 3 cycles    . cetirizine (ZYRTEC) 10 MG tablet Take 10 mg by mouth daily.    . cholecalciferol  (VITAMIN D) 1000 units tablet Take 1,000 Units by mouth daily.    . Diphenhyd-Hydrocort-Nystatin (FIRST-DUKES MOUTHWASH) SUSP Use as directed 5 mLs in the mouth or throat 4 (four) times daily -  before meals and at bedtime. 240 mL 1  . fentaNYL (DURAGESIC - DOSED MCG/HR) 12 MCG/HR Apply to skin. Change every 72 hours. 10 patch 0  . fentaNYL (DURAGESIC - DOSED MCG/HR) 25 MCG/HR patch Place 1 patch (25 mcg total) onto the skin every 3 (three) days. 10 patch 0  . fluconazole (DIFLUCAN) 10 MG/ML suspension Take 10 mLs (100 mg total) by mouth daily. 300 mL 1  . fluorouracil CALGB 38453 in sodium chloride 0.9 % 150 mL Inject into the vein over 96 hr. X 3 cycles    . guaiFENesin (  ROBITUSSIN) 100 MG/5ML SOLN Take 5 mLs (100 mg total) by mouth every 6 (six) hours as needed for cough or to loosen phlegm. 240 mL 1  . lidocaine (XYLOCAINE) 2 % solution Use as directed 20 mLs in the mouth or throat every 4 (four) hours as needed for mouth pain.   4  . lidocaine-prilocaine (EMLA) cream Apply a quarter size amount to port site 1 hour prior to chemo. Do not rub in. Cover with plastic wrap. 30 g 3  . lisinopril-hydrochlorothiazide (PRINZIDE,ZESTORETIC) 20-25 MG tablet Take 1 tablet by mouth daily. Reported on 09/25/2015  11  . LORazepam (ATIVAN) 0.5 MG tablet Take 1 tablet (0.5 mg total) by mouth every 4 (four) hours. 30 tablet 0  . Melatonin 10 MG TABS Take 1 tablet by mouth daily as needed (sleep). Reported on 09/25/2015    . metroNIDAZOLE (FLAGYL) 500 MG tablet TAKE 1 TABLET BY MOUTH THREE TIMES DAILY FOR 6 DAYS  0  . mupirocin ointment (BACTROBAN) 2 % Place 1 application into the nose 2 (two) times daily. (Patient taking differently: Place 1 application into the nose 2 (two) times daily. Apply to rash on thighs and groin) 22 g 1  . nicotine (NICODERM CQ) 14 mg/24hr patch apply 21 mg patch daily x6wk, then 14 mg patch daily x2wk, then 7 mg patch daily x2wk 14 patch 0  . nicotine (NICODERM CQ) 21 mg/24hr patch apply 21  mg patch daily x6wk, then 14 mg patch daily x2wk, then 7 mg patch daily x2wk (Patient not taking: Reported on 09/25/2015) 21 patch 1  . nicotine (NICODERM CQ) 7 mg/24hr patch apply 21 mg patch daily x6wk, then 14 mg patch daily x2wk, then 7 mg patch daily x2wk (Patient not taking: Reported on 09/25/2015) 14 patch 0  . Nutritional Supplements (JEVITY 1.2 CAL/FIBER) LIQD 8 cans/day.  4 feeding of 2 cans each. Flush with 100 mls before and after each feeding 1000 mL 6  . nystatin (MYCOSTATIN) 100000 UNIT/ML suspension Take 5 mLs (500,000 Units total) by mouth 4 (four) times daily. 120 mL 1  . ondansetron (ZOFRAN ODT) 8 MG disintegrating tablet Take 1 tablet (8 mg total) by mouth every 8 (eight) hours as needed for nausea or vomiting. 30 tablet 2  . oxyCODONE (ROXICODONE) 5 MG/5ML solution Take 5-10 mLs (5-10 mg total) by mouth every 4 (four) hours as needed for severe pain. 250 mL 0  . pantoprazole (PROTONIX) 40 MG tablet Take 1 tablet (40 mg total) by mouth daily. 30 tablet 3  . prochlorperazine (COMPAZINE) 10 MG tablet Take 1 tablet (10 mg total) by mouth every 6 (six) hours as needed for nausea or vomiting. 30 tablet 2  . silver sulfADIAZINE (SILVADENE) 1 % cream   4  . sodium fluoride (FLUORISHIELD) 1.1 % GEL dental gel Instill one drop of gel per tooth space of fluoride tray. Place over teeth for 5 minutes. Remove. Spit out excess. Repeat nightly. 120 mL prn  . sucralfate (CARAFATE) 1 g tablet Reported on 09/25/2015  0  . traMADol (ULTRAM) 50 MG tablet Take 1 tablet (50 mg total) by mouth every 6 (six) hours as needed. (Patient not taking: Reported on 09/25/2015) 60 tablet 1   Current Facility-Administered Medications  Medication Dose Route Frequency Provider Last Rate Last Dose  . fentaNYL (DURAGESIC - dosed mcg/hr) 12.5 mcg  12.5 mcg Transdermal Q72H Frank Ranks, MD   12.5 mcg at 09/30/15 0957   Facility-Administered Medications Ordered in Other Visits  Medication Dose  Route Frequency Provider  Last Rate Last Dose  . heparin lock flush 100 unit/mL  500 Units Intracatheter Once PRN Frank Ranks, MD      . sodium chloride flush (NS) 0.9 % injection 10 mL  10 mL Intracatheter PRN Frank Ranks, MD        Review of Systems  Constitutional: Positive for malaise/fatigue. Negative for fever, chills.  HENT: Positive for mouth pain, and sore throat. Negative for congestion, hearing loss and nosebleeds.   Mouth pain, mostly on the sides of his tongue. Eyes: Negative.  Negative for blurred vision, double vision, pain and discharge.  Respiratory: Positive for sputum production.  Negative for cough, hemoptysis, shortness of breath and wheezing.   Has thick secretions. Continues to swish soda.  Cardiovascular: Negative.  Negative for chest pain, palpitations, claudication, leg swelling and PND.  Gastrointestinal: Positive for nausea, constipation, and diarrhea.  Negative for heartburn, vomiting, abdominal pain, blood in stool and melena.  Nausea began today. Diarrhea began yesterday after taking Miralax. Constipation prior to yesterday. Genitourinary: Negative.  Negative for dysuria, urgency, frequency and hematuria.  Musculoskeletal: Negative.  Negative for myalgias, joint pain and falls.  Skin: Negative.  Negative for itching and rash.  Neurological: Negative.  Negative for dizziness, tingling, tremors, sensory change, speech change, focal weakness, seizures, loss of consciousness, weakness and headaches.  Endo/Heme/Allergies: Negative.  Does not bruise/bleed easily.  Psychiatric/Behavioral: Positive for insomnia.  Negative for depression, suicidal ideas, memory loss and substance abuse. The patient is not nervous/anxious. Trouble sleeping due to mouth pain, heavy sweating, and thick mouth secretions. All other systems reviewed and are negative.  14 point ROS was done and is otherwise as detailed above or in HPI   PHYSICAL EXAMINATION: ECOG PERFORMANCE STATUS: 0 - Asymptomatic    Vitals with BMI 09/30/2015  Height   Weight 170 lbs  BMI   Systolic 179  Diastolic 68  Pulse 97  Respirations 18   Physical Exam  Constitutional: He is oriented to person, place, and time and well-developed, well-nourished, and in no distress.  HENT:  Head: Normocephalic and atraumatic.  Nose: Nose normal.  Mouth/Throat: Oropharynx is clear and moist. No oropharyngeal exudate. Mild posterior oropharyngeal erythema. Mucositis noted. Sides of tongue quite "raw' erythema and plaque Eyes: Conjunctivae and EOM are normal. Pupils are equal, round, and reactive to light. Right eye exhibits no discharge. Left eye exhibits no discharge. No scleral icterus.  Neck: Normal range of motion. Neck supple. No tracheal deviation present. No thyromegaly present.  Significant bilateral XRT changes. Skin is well taken care of Cardiovascular: Normal rate, regular rhythm and normal heart sounds.  Exam reveals no gallop and no friction rub.  Port site is C/D/I no erythema or bruising No murmur heard.  Pulmonary/Chest: Effort normal and breath sounds normal. He has no wheezes. He has no rales.  Abdominal: Soft. Bowel sounds are normal. He exhibits no distension and no mass. There is no tenderness. There is no rebound and no guarding. FT site is dressed Musculoskeletal: Normal range of motion. He exhibits no edema.  Neurological: He is alert and oriented to person, place, and time. He has normal reflexes. No cranial nerve deficit. Gait normal. Coordination normal.  Skin: Skin is warm and dry. No rash noted.  Psychiatric: Mood, memory, affect and judgment normal.  Nursing note and vitals reviewed.   LABORATORY DATA:  I have reviewed the data as listed Results for RONDALE, NIES (MRN [id number]) as of 09/30/2015 09:24  Ref. Range  09/21/2015 05:49 09/27/2015 10:58  Sodium Latest Ref Range: 135-145 mmol/L 132 (L)   Potassium Latest Ref Range: 3.5-5.1 mmol/L 4.5   Chloride Latest Ref Range: 101-111 mmol/L 101    CO2 Latest Ref Range: 22-32 mmol/L 24   BUN Latest Ref Range: 6-20 mg/dL 20   Creatinine Latest Ref Range: 0.61-1.24 mg/dL 0.35   Calcium Latest Ref Range: 8.9-10.3 mg/dL 8.7 (L)   EGFR (Non-African Amer.) Latest Ref Range: >60 mL/min >60   EGFR (African American) Latest Ref Range: >60 mL/min >60   Glucose Latest Ref Range: 65-99 mg/dL 248 (H)   Anion gap Latest Ref Range: 5-15  7   Alkaline Phosphatase Latest Ref Range: 38-126 U/L 36 (L)   Albumin Latest Ref Range: 3.5-5.0 g/dL 3.0 (L)   AST Latest Ref Range: 15-41 U/L 14 (L)   ALT Latest Ref Range: 17-63 U/L 11 (L)   Total Protein Latest Ref Range: 6.5-8.1 g/dL 6.0 (L)   Total Bilirubin Latest Ref Range: 0.3-1.2 mg/dL 0.6   WBC Latest Ref Range: 4.0-10.5 K/uL 6.8   RBC Latest Ref Range: 4.22-5.81 MIL/uL 3.53 (L)   Hemoglobin Latest Ref Range: 13.0-17.0 g/dL 18.5 (L)   HCT Latest Ref Range: 39.0-52.0 % 30.4 (L)   MCV Latest Ref Range: 78.0-100.0 fL 86.1   MCH Latest Ref Range: 26.0-34.0 pg 29.5   MCHC Latest Ref Range: 30.0-36.0 g/dL 90.9   RDW Latest Ref Range: 11.5-15.5 % 14.4   Platelets Latest Ref Range: 150-400 K/uL 139 (L)   Neutrophils Latest Units: % 89   Lymphocytes Latest Units: % 7   Monocytes Relative Latest Units: % 4   Eosinophil Latest Units: % 0   Basophil Latest Units: % 0   NEUT# Latest Ref Range: 1.7-7.7 K/uL 6.0   Lymphocyte # Latest Ref Range: 0.7-4.0 K/uL 0.5 (L)   Monocyte # Latest Ref Range: 0.1-1.0 K/uL 0.3   Eosinophils Absolute Latest Ref Range: 0.0-0.7 K/uL 0.0   Basophils Absolute Latest Ref Range: 0.0-0.1 K/uL 0.0   Appearance Latest Ref Range: CLEAR   CLEAR  Bilirubin Urine Latest Ref Range: NEGATIVE   NEGATIVE  Color, Urine Latest Ref Range: YELLOW   YELLOW  Glucose Latest Ref Range: NEGATIVE mg/dL  NEGATIVE  Hgb urine dipstick Latest Ref Range: NEGATIVE   NEGATIVE  Ketones, ur Latest Ref Range: NEGATIVE mg/dL  NEGATIVE  Leukocytes, UA Latest Ref Range: NEGATIVE   NEGATIVE  Nitrite Latest Ref  Range: NEGATIVE   NEGATIVE  pH Latest Ref Range: 5.0-8.0   7.0  Protein Latest Ref Range: NEGATIVE mg/dL  NEGATIVE  Specific Gravity, Urine Latest Ref Range: 1.005-1.030   1.010    RADIOGRAPHIC STUDIES: I have personally reviewed the radiological reports as listed.  Study Result     CLINICAL DATA: Initial treatment strategy for primary tonsillar squamous cell carcinoma. Left-sided lung tonsillectomy.  EXAM: NUCLEAR MEDICINE PET SKULL BASE TO THIGH  TECHNIQUE: 8.8 mCi F-18 FDG was injected intravenously. Full-ring PET imaging was performed from the skull base to thigh after the radiotracer. CT data was obtained and used for attenuation correction and anatomic localization.  FASTING BLOOD GLUCOSE: Value: 116 mg/dl  COMPARISON: Clinic note of 07/24/2015. Neck and chest CTs of 06/25/2015.  FINDINGS: NECK  No tonsilar hypermetabolism.  Hypermetabolic left-sided level 2 nodes. Index node measures 2.1 cm and a S.U.V. max of 17.9 on image 25/series 4. 1.9 cm on 06/25/2015.  Immediately inferior node measures 9 mm and a S.U.V. max of 3.8 on image 30/series 4.  CHEST  No areas of abnormal hypermetabolism.  ABDOMEN/PELVIS  No areas of abnormal hypermetabolism.  SKELETON  No abnormal marrow activity.  CT IMAGES PERFORMED FOR ATTENUATION CORRECTION  carotid atherosclerosis. Mild cardiomegaly with multivessel coronary artery atherosclerosis. Mild centrilobular emphysema. Mild bibasilar atelectasis. Normal adrenal glands. Gallstones and a contracted gallbladder. Moderate cirrhosis. Advanced abdominal aortic atherosclerosis. Scattered colonic diverticula. Mild prostatomegaly. Fat containing left inguinal hernia. Prior right inguinal hernia repair.  IMPRESSION: 1. Hypermetabolic left-sided level 2 adenopathy, consistent with nodal metastasis. 2. No extracervical metastatic disease identified. 3. Cirrhosis. 4. Atherosclerosis, including within the  coronary arteries. 5. Cholelithiasis.   Electronically Signed  By: Abigail Miyamoto M.D.  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 He is seen at [facility], Hepatitis C negative CT neck with contrast 06/25/2015 at [place] level 2 malignant LAD, largest node is 3.5 cm long axis. R worse than L carotid atherosclerosis and stenosis. CT chest with no acute or significant findings within the chest [facility] stay secondary to intractable n/v and mucositis Pain secondary to therapy  1. 5-FU will be reduced given degree of mucositis with prior cycle. He still has significant mucositis but it is markedly improved 2. Increase fentanyl to 25 mcg, can increase to 37.5 mcg in middle of week if needed 3. He will return for hydration as scheduled 4. He is to continue with other symptom management as prescribed.  5. Continue with TF, weight is fairly stable.  He will return for follow up on 10/11/15. He will return sooner if current symptoms worsen  All questions were answered. The patient knows to call the clinic with any problems, questions or concerns.  This document serves as a record of services personally performed by Ancil Linsey, MD. It was created on her behalf by Arlyce Harman, a trained medical scribe. The creation of this record is based on the scribe's personal observations and the provider's statements to them. This document has been checked and approved by the attending provider.  I have reviewed the above documentation for accuracy and completeness, and I agree with the above.  This note was electronically signed.    Molli Hazard, MD  09/30/2015 10:14 AM

## 2015-09-30 NOTE — Patient Instructions (Addendum)
[facility] Discharge Instructions for Patients Receiving Chemotherapy   Beginning January 23rd 2017 lab work for the [facility] will be done in the  Main lab at [facility] on 1st floor. If you have a lab appointment with the [place] [facility] please come in thru the  Main Entrance and check in at the main information desk   Today you received the following chemotherapy agents  16fu and carboplatin Return as scheduled for fluids Decreased your 68fu today If your mucositis is worse please call us  12.5 mcg fentanyl patch added to day to make 85mcg please let us know if this helps    Carboplatin injection What is this medicine? CARBOPLATIN (KAR boe pla tin) is a chemotherapy drug. It targets fast dividing cells, like cancer cells, and causes these cells to die. This medicine is used to treat ovarian cancer and many other cancers. This medicine may be used for other purposes; ask your health care provider or pharmacist if you have questions. What should I tell my health care provider before I take this medicine? They need to know if you have any of these conditions: -blood disorders -hearing problems -kidney disease -recent or ongoing radiation therapy -an unusual or allergic reaction to carboplatin, cisplatin, other chemotherapy, other medicines, foods, dyes, or preservatives -pregnant or trying to get pregnant -breast-feeding How should I use this medicine? This drug is usually given as an infusion into a vein. It is administered in a hospital or clinic by a specially trained health care professional. Talk to your pediatrician regarding the use of this medicine in children. Special care may be needed. Overdosage: If you think you have taken too much of this medicine contact a poison control center or emergency room at once. NOTE: This medicine is only for you. Do not share this medicine with others. What if I miss a dose? It is important not to miss a dose. Call your  doctor or health care professional if you are unable to keep an appointment. What may interact with this medicine? -medicines for seizures -medicines to increase blood counts like filgrastim, pegfilgrastim, sargramostim -some antibiotics like amikacin, gentamicin, neomycin, streptomycin, tobramycin -vaccines Talk to your doctor or health care professional before taking any of these medicines: -acetaminophen -aspirin -ibuprofen -ketoprofen -naproxen This list may not describe all possible interactions. Give your health care provider a list of all the medicines, herbs, non-prescription drugs, or dietary supplements you use. Also tell them if you smoke, drink alcohol, or use illegal drugs. Some items may interact with your medicine. What should I watch for while using this medicine? Your condition will be monitored carefully while you are receiving this medicine. You will need important blood work done while you are taking this medicine. This drug may make you feel generally unwell. This is not uncommon, as chemotherapy can affect healthy cells as well as cancer cells. Report any side effects. Continue your course of treatment even though you feel ill unless your doctor tells you to stop. In some cases, you may be given additional medicines to help with side effects. Follow all directions for their use. Call your doctor or health care professional for advice if you get a fever, chills or sore throat, or other symptoms of a cold or flu. Do not treat yourself. This drug decreases your body's ability to fight infections. Try to avoid being around people who are sick. This medicine may increase your risk to bruise or bleed. Call your doctor or health care  professional if you notice any unusual bleeding. Be careful brushing and flossing your teeth or using a toothpick because you may get an infection or bleed more easily. If you have any dental work done, tell your dentist you are receiving this  medicine. Avoid taking products that contain aspirin, acetaminophen, ibuprofen, naproxen, or ketoprofen unless instructed by your doctor. These medicines may hide a fever. Do not become pregnant while taking this medicine. Women should inform their doctor if they wish to become pregnant or think they might be pregnant. There is a potential for serious side effects to an unborn child. Talk to your health care professional or pharmacist for more information. Do not breast-feed an infant while taking this medicine. What side effects may I notice from receiving this medicine? Side effects that you should report to your doctor or health care professional as soon as possible: -allergic reactions like skin rash, itching or hives, swelling of the face, lips, or tongue -signs of infection - fever or chills, cough, sore throat, pain or difficulty passing urine -signs of decreased platelets or bleeding - bruising, pinpoint red spots on the skin, black, tarry stools, nosebleeds -signs of decreased red blood cells - unusually weak or tired, fainting spells, lightheadedness -breathing problems -changes in hearing -changes in vision -chest pain -high blood pressure -low blood counts - This drug may decrease the number of white blood cells, red blood cells and platelets. You may be at increased risk for infections and bleeding. -nausea and vomiting -pain, swelling, redness or irritation at the injection site -pain, tingling, numbness in the hands or feet -problems with balance, talking, walking -trouble passing urine or change in the amount of urine Side effects that usually do not require medical attention (report to your doctor or health care professional if they continue or are bothersome): -hair loss -loss of appetite -metallic taste in the mouth or changes in taste This list may not describe all possible side effects. Call your doctor for medical advice about side effects. You may report side effects to  FDA at 1-800-FDA-1088. Where should I keep my medicine? This drug is given in a hospital or clinic and will not be stored at home. NOTE: This sheet is a summary. It may not cover all possible information. If you have questions about this medicine, talk to your doctor, pharmacist, or health care provider.    2016, Elsevier/Gold Standard. (2007-08-16 14:38:05)     Fluorouracil, 5-FU injection What is this medicine? FLUOROURACIL, 5-FU (flure oh YOOR a sil) is a chemotherapy drug. It slows the growth of cancer cells. This medicine is used to treat many types of cancer like breast cancer, colon or rectal cancer, pancreatic cancer, and stomach cancer. This medicine may be used for other purposes; ask your health care provider or pharmacist if you have questions. What should I tell my health care provider before I take this medicine? They need to know if you have any of these conditions: -blood disorders -dihydropyrimidine dehydrogenase (DPD) deficiency -infection (especially a virus infection such as chickenpox, cold sores, or herpes) -kidney disease -liver disease -malnourished, poor nutrition -recent or ongoing radiation therapy -an unusual or allergic reaction to fluorouracil, other chemotherapy, other medicines, foods, dyes, or preservatives -pregnant or trying to get pregnant -breast-feeding How should I use this medicine? This drug is given as an infusion or injection into a vein. It is administered in a hospital or clinic by a specially trained health care professional. Talk to your pediatrician regarding the use of  this medicine in children. Special care may be needed. Overdosage: If you think you have taken too much of this medicine contact a poison control center or emergency room at once. NOTE: This medicine is only for you. Do not share this medicine with others. What if I miss a dose? It is important not to miss your dose. Call your doctor or health care professional if you  are unable to keep an appointment. What may interact with this medicine? -allopurinol -cimetidine -dapsone -digoxin -hydroxyurea -leucovorin -levamisole -medicines for seizures like ethotoin, fosphenytoin, phenytoin -medicines to increase blood counts like filgrastim, pegfilgrastim, sargramostim -medicines that treat or prevent blood clots like warfarin, enoxaparin, and dalteparin -methotrexate -metronidazole -pyrimethamine -some other chemotherapy drugs like busulfan, cisplatin, estramustine, vinblastine -trimethoprim -trimetrexate -vaccines Talk to your doctor or health care professional before taking any of these medicines: -acetaminophen -aspirin -ibuprofen -ketoprofen -naproxen This list may not describe all possible interactions. Give your health care provider a list of all the medicines, herbs, non-prescription drugs, or dietary supplements you use. Also tell them if you smoke, drink alcohol, or use illegal drugs. Some items may interact with your medicine. What should I watch for while using this medicine? Visit your doctor for checks on your progress. This drug may make you feel generally unwell. This is not uncommon, as chemotherapy can affect healthy cells as well as cancer cells. Report any side effects. Continue your course of treatment even though you feel ill unless your doctor tells you to stop. In some cases, you may be given additional medicines to help with side effects. Follow all directions for their use. Call your doctor or health care professional for advice if you get a fever, chills or sore throat, or other symptoms of a cold or flu. Do not treat yourself. This drug decreases your body's ability to fight infections. Try to avoid being around people who are sick. This medicine may increase your risk to bruise or bleed. Call your doctor or health care professional if you notice any unusual bleeding. Be careful brushing and flossing your teeth or using a toothpick  because you may get an infection or bleed more easily. If you have any dental work done, tell your dentist you are receiving this medicine. Avoid taking products that contain aspirin, acetaminophen, ibuprofen, naproxen, or ketoprofen unless instructed by your doctor. These medicines may hide a fever. Do not become pregnant while taking this medicine. Women should inform their doctor if they wish to become pregnant or think they might be pregnant. There is a potential for serious side effects to an unborn child. Talk to your health care professional or pharmacist for more information. Do not breast-feed an infant while taking this medicine. Men should inform their doctor if they wish to father a child. This medicine may lower sperm counts. Do not treat diarrhea with over the counter products. Contact your doctor if you have diarrhea that lasts more than 2 days or if it is severe and watery. This medicine can make you more sensitive to the sun. Keep out of the sun. If you cannot avoid being in the sun, wear protective clothing and use sunscreen. Do not use sun lamps or tanning beds/booths. What side effects may I notice from receiving this medicine? Side effects that you should report to your doctor or health care professional as soon as possible: -allergic reactions like skin rash, itching or hives, swelling of the face, lips, or tongue -low blood counts - this medicine may decrease the number  of white blood cells, red blood cells and platelets. You may be at increased risk for infections and bleeding. -signs of infection - fever or chills, cough, sore throat, pain or difficulty passing urine -signs of decreased platelets or bleeding - bruising, pinpoint red spots on the skin, black, tarry stools, blood in the urine -signs of decreased red blood cells - unusually weak or tired, fainting spells, lightheadedness -breathing problems -changes in vision -chest pain -mouth sores -nausea and  vomiting -pain, swelling, redness at site where injected -pain, tingling, numbness in the hands or feet -redness, swelling, or sores on hands or feet -stomach pain -unusual bleeding Side effects that usually do not require medical attention (report to your doctor or health care professional if they continue or are bothersome): -changes in finger or toe nails -diarrhea -dry or itchy skin -hair loss -headache -loss of appetite -sensitivity of eyes to the light -stomach upset -unusually teary eyes This list may not describe all possible side effects. Call your doctor for medical advice about side effects. You may report side effects to FDA at 1-800-FDA-1088. Where should I keep my medicine? This drug is given in a hospital or clinic and will not be stored at home. NOTE: This sheet is a summary. It may not cover all possible information. If you have questions about this medicine, talk to your doctor, pharmacist, or health care provider.    2016, Elsevier/Gold Standard. (2007-09-14 13:53:16)      To help prevent nausea and vomiting after your treatment, we encourage you to take your nausea medication    If you develop nausea and vomiting, or diarrhea that is not controlled by your medication, call the clinic.  The clinic phone number is [phone]. Office hours are Monday-Friday 8:30am-5:00pm.  BELOW ARE SYMPTOMS THAT SHOULD BE REPORTED IMMEDIATELY:  *FEVER GREATER THAN 101.0 F  *CHILLS WITH OR WITHOUT FEVER  NAUSEA AND VOMITING THAT IS NOT CONTROLLED WITH YOUR NAUSEA MEDICATION  *UNUSUAL SHORTNESS OF BREATH  *UNUSUAL BRUISING OR BLEEDING  TENDERNESS IN MOUTH AND THROAT WITH OR WITHOUT PRESENCE OF ULCERS  *URINARY PROBLEMS  *BOWEL PROBLEMS  UNUSUAL RASH Items with * indicate a potential emergency and should be followed up as soon as possible. If you have an emergency after office hours please contact your primary care physician or go to the nearest emergency  department.  Please call the clinic during office hours if you have any questions or concerns.   You may also contact the Patient Navigator at [phone] should you have any questions or need assistance in obtaining follow up care.      Resources For Cancer Patients and their Caregivers ? American Cancer Society: Can assist with transportation, wigs, general needs, runs Look Good Feel Better.        403 538 3066 ? Cancer Care: Provides financial assistance, online support groups, medication/co-pay assistance.  1-800-813-HOPE 270-551-1652) ? [place] Assists [place] cancer patients and their families through emotional , educational and financial support.  832-462-2271 ? [place] DSS Where to apply for food stamps, Medicaid and utility assistance. 984-356-9418 ? RCATS: Transportation to medical appointments. 731-463-3588 ? Social Security Administration: May apply for disability if have a Stage IV cancer. [phone] ? [employer], Disability and Transit Services: Assists with nutrition, care and transit needs. [phone]

## 2015-09-30 NOTE — Patient Instructions (Signed)
[place] at [facility] Discharge Instructions  RECOMMENDATIONS MADE BY THE CONSULTANT AND ANY TEST RESULTS WILL BE SENT TO YOUR REFERRING PHYSICIAN.  Exam done and seen today by Dr. Gustavus Bryant reviewed today, treatment today if labs are ok. We added a 12.5 Fentanyl patch for today. We prescribed a 25 Fentanyl patch to put on Wednesday. If you need to, you can add a 12.5 patch every 72 hours. We decreased your 5FU dose today. Added some additional nausea meds. We refilled your oxycodone today. Return to see the doctor on Monday Please call the clinic if you have any questions or concerns  Thank you for choosing [place] at [facility] to provide your oncology and hematology care.  To afford each patient quality time with our provider, please arrive at least 15 minutes before your scheduled appointment time.   Beginning January 23rd 2017 lab work for the [facility] will be done in the  Main lab at [facility] on 1st floor. If you have a lab appointment with the [facility] please come in thru the  Main Entrance and check in at the main information desk  You need to re-schedule your appointment should you arrive 10 or more minutes late.  We strive to give you quality time with our providers, and arriving late affects you and other patients whose appointments are after yours.  Also, if you no show three or more times for appointments you may be dismissed from the clinic at the providers discretion.     Again, thank you for choosing [facility].  Our hope is that these requests will decrease the amount of time that you wait before being seen by our physicians.       _____________________________________________________________  Should you have questions after your visit to [facility], please contact our office at [phone] between the hours of 8:30 a.m. and 4:30 p.m.  Voicemails left after 4:30 p.m. will not  be returned until the following business day.  For prescription refill requests, have your pharmacy contact our office.         Resources For Cancer Patients and their Caregivers ? American Cancer Society: Can assist with transportation, wigs, general needs, runs Look Good Feel Better.        865-835-3095 ? Cancer Care: Provides financial assistance, online support groups, medication/co-pay assistance.  1-800-813-HOPE (670)852-5519) ? [place] Assists [place] cancer patients and their families through emotional , educational and financial support.  (501)388-9034 ? [place] DSS Where to apply for food stamps, Medicaid and utility assistance. 5813017396 ? RCATS: Transportation to medical appointments. 9202766133 ? Social Security Administration: May apply for disability if have a Stage IV cancer. [phone] 601-615-9934 ? [employer], Disability and Transit Services: Assists with nutrition, care and transit needs. [phone]

## 2015-10-02 ENCOUNTER — Ambulatory Visit (HOSPITAL_COMMUNITY): Payer: Self-pay

## 2015-10-02 ENCOUNTER — Inpatient Hospital Stay (HOSPITAL_COMMUNITY): Payer: Self-pay

## 2015-10-02 ENCOUNTER — Encounter (HOSPITAL_COMMUNITY): Payer: Self-pay

## 2015-10-02 ENCOUNTER — Ambulatory Visit (HOSPITAL_COMMUNITY): Payer: Self-pay | Admitting: Hematology & Oncology

## 2015-10-02 ENCOUNTER — Encounter (HOSPITAL_COMMUNITY): Payer: BLUE CROSS/BLUE SHIELD

## 2015-10-02 VITALS — BP 121/72 | HR 70 | Temp 97.1°F | Resp 18 | Wt 167.2 lb

## 2015-10-02 DIAGNOSIS — C090 Malignant neoplasm of tonsillar fossa: Secondary | ICD-10-CM | POA: Diagnosis not present

## 2015-10-02 DIAGNOSIS — R112 Nausea with vomiting, unspecified: Secondary | ICD-10-CM | POA: Diagnosis not present

## 2015-10-02 MED ORDER — SODIUM CHLORIDE 0.9 % IV SOLN
INTRAVENOUS | Status: DC
  Administered 2015-10-02: 11:00:00 via INTRAVENOUS

## 2015-10-02 MED ORDER — HEPARIN SOD (PORK) LOCK FLUSH 100 UNIT/ML IV SOLN
500.0000 [IU] | Freq: Once | INTRAVENOUS | Status: AC | PRN
  Administered 2015-10-02: 500 [IU]

## 2015-10-02 MED ORDER — LORAZEPAM 0.5 MG PO TABS: 0.5000 mg | ORAL_TABLET | ORAL | Status: DC

## 2015-10-02 MED ORDER — HEPARIN SOD (PORK) LOCK FLUSH 100 UNIT/ML IV SOLN
INTRAVENOUS | Status: AC
  Filled 2015-10-02: qty 5

## 2015-10-02 MED ORDER — SODIUM CHLORIDE 0.9% FLUSH
10.0000 mL | INTRAVENOUS | Status: DC | PRN
  Administered 2015-10-02: 10 mL
  Filled 2015-10-02: qty 10

## 2015-10-02 NOTE — Progress Notes (Signed)
Frank Castillo presented for Portacath de- access and flush.  Proper placement of portacath confirmed by CXR.  Portacath located right chest wall accessed with  H 20 needle.  Good blood return present. Portacath flushed with 23ml NS and 500U/48ml Heparin and needle removed intact.  Procedure tolerated well and without incident. Pump removed     Notified dr Whitney Muse of increased pain in mouth and mucositis.  D/C chemotherapy pump today.  Tolerated IV fluids well today.  Ativan refilled

## 2015-10-02 NOTE — Patient Instructions (Addendum)
[place] at [facility] Discharge Instructions  RECOMMENDATIONS MADE BY THE CONSULTANT AND ANY TEST RESULTS WILL BE SENT TO YOUR REFERRING PHYSICIAN.    IV fluids today Chemotherapy discontinued today Apply a 34mcg patch and a 12.5 mcg patch tonight Ativan refilled today Follow up as scheduled   Thank you for choosing [place] at [facility] to provide your oncology and hematology care.  To afford each patient quality time with our provider, please arrive at least 15 minutes before your scheduled appointment time.   Beginning January 23rd 2017 lab work for the [facility] will be done in the  Main lab at [facility] on 1st floor. If you have a lab appointment with the [facility] please come in thru the  Main Entrance and check in at the main information desk  You need to re-schedule your appointment should you arrive 10 or more minutes late.  We strive to give you quality time with our providers, and arriving late affects you and other patients whose appointments are after yours.  Also, if you no show three or more times for appointments you may be dismissed from the clinic at the providers discretion.     Again, thank you for choosing [facility].  Our hope is that these requests will decrease the amount of time that you wait before being seen by our physicians.       _____________________________________________________________  Should you have questions after your visit to [facility], please contact our office at [phone] between the hours of 8:30 a.m. and 4:30 p.m.  Voicemails left after 4:30 p.m. will not be returned until the following business day.  For prescription refill requests, have your pharmacy contact our office.         Resources For Cancer Patients and their Caregivers ? American Cancer Society: Can assist with transportation, wigs, general needs, runs Look Good Feel Better.         501-610-0727 ? Cancer Care: Provides financial assistance, online support groups, medication/co-pay assistance.  1-800-813-HOPE (317)019-9039) ? [place] Assists [place] cancer patients and their families through emotional , educational and financial support.  (563) 276-8632 ? [place] DSS Where to apply for food stamps, Medicaid and utility assistance. 918-363-5274 ? RCATS: Transportation to medical appointments. 570-691-0741 ? Social Security Administration: May apply for disability if have a Stage IV cancer. 364-756-3564 914-114-3626 ? [employer], Disability and Transit Services: Assists with nutrition, care and transit needs. [place] Support Programs: @10RELATIVEDAYS @ > Cancer Support Group  2nd Tuesday of the month 1pm-2pm, Journey Room  > Creative Journey  3rd Tuesday of the month 1130am-1pm, Journey Room  > Look Good Feel Better  1st Wednesday of the month 10am-12 noon, Journey Room (Call [place] to register [phone])

## 2015-10-03 ENCOUNTER — Telehealth (HOSPITAL_COMMUNITY): Payer: Self-pay | Admitting: *Deleted

## 2015-10-03 ENCOUNTER — Other Ambulatory Visit (HOSPITAL_COMMUNITY): Payer: Self-pay | Admitting: Oncology

## 2015-10-03 DIAGNOSIS — B370 Candidal stomatitis: Secondary | ICD-10-CM

## 2015-10-03 DIAGNOSIS — C090 Malignant neoplasm of tonsillar fossa: Secondary | ICD-10-CM

## 2015-10-03 MED ORDER — OXYCODONE HCL 5 MG/5ML PO SOLN: 5.0000 mg | ORAL | Status: DC | PRN

## 2015-10-03 MED ORDER — METRONIDAZOLE 500 MG PO TABS: ORAL_TABLET | ORAL | Status: DC

## 2015-10-03 NOTE — Telephone Encounter (Signed)
Flagyl is escribed.  Pain medication is ready for pick-up.  TK

## 2015-10-04 ENCOUNTER — Encounter (HOSPITAL_COMMUNITY): Payer: Self-pay

## 2015-10-04 ENCOUNTER — Ambulatory Visit (HOSPITAL_COMMUNITY): Payer: Self-pay

## 2015-10-04 ENCOUNTER — Encounter (HOSPITAL_BASED_OUTPATIENT_CLINIC_OR_DEPARTMENT_OTHER): Payer: BLUE CROSS/BLUE SHIELD

## 2015-10-04 ENCOUNTER — Other Ambulatory Visit (HOSPITAL_COMMUNITY): Payer: Self-pay | Admitting: *Deleted

## 2015-10-04 VITALS — BP 128/70 | HR 68 | Temp 98.0°F | Resp 16 | Wt 165.8 lb

## 2015-10-04 DIAGNOSIS — C090 Malignant neoplasm of tonsillar fossa: Secondary | ICD-10-CM

## 2015-10-04 DIAGNOSIS — R112 Nausea with vomiting, unspecified: Secondary | ICD-10-CM

## 2015-10-04 MED ORDER — SODIUM CHLORIDE 0.9 % IV SOLN
Freq: Once | INTRAVENOUS | Status: AC
  Administered 2015-10-04: 09:00:00 via INTRAVENOUS

## 2015-10-04 MED ORDER — SODIUM CHLORIDE 0.9 % IV SOLN
12.0000 mg | Freq: Once | INTRAVENOUS | Status: AC
  Administered 2015-10-04: 12 mg via INTRAVENOUS
  Filled 2015-10-04: qty 1.2

## 2015-10-04 MED ORDER — LORAZEPAM 2 MG/ML IJ SOLN
1.0000 mg | Freq: Once | INTRAMUSCULAR | Status: AC
  Administered 2015-10-04: 1 mg via INTRAVENOUS

## 2015-10-04 MED ORDER — CLINDAMYCIN PHOSPHATE 1 % EX GEL: Freq: Two times a day (BID) | CUTANEOUS | Status: DC

## 2015-10-04 MED ORDER — HEPARIN SOD (PORK) LOCK FLUSH 100 UNIT/ML IV SOLN
500.0000 [IU] | Freq: Once | INTRAVENOUS | Status: AC
  Administered 2015-10-04: 500 [IU] via INTRAVENOUS

## 2015-10-04 MED ORDER — LORAZEPAM 2 MG/ML IJ SOLN
INTRAMUSCULAR | Status: AC
  Filled 2015-10-04: qty 1

## 2015-10-04 NOTE — Patient Instructions (Signed)
[place] at [facility] Discharge Instructions  RECOMMENDATIONS MADE BY THE CONSULTANT AND ANY TEST RESULTS WILL BE SENT TO YOUR REFERRING PHYSICIAN.  IV fluids and decadron and lorazepam for nausea.    Thank you for choosing [place] at [facility] to provide your oncology and hematology care.  To afford each patient quality time with our provider, please arrive at least 15 minutes before your scheduled appointment time.   Beginning January 23rd 2017 lab work for the [facility] will be done in the  Main lab at [facility] on 1st floor. If you have a lab appointment with the [facility] please come in thru the  Main Entrance and check in at the main information desk  You need to re-schedule your appointment should you arrive 10 or more minutes late.  We strive to give you quality time with our providers, and arriving late affects you and other patients whose appointments are after yours.  Also, if you no show three or more times for appointments you may be dismissed from the clinic at the providers discretion.     Again, thank you for choosing [facility].  Our hope is that these requests will decrease the amount of time that you wait before being seen by our physicians.       _____________________________________________________________  Should you have questions after your visit to [facility], please contact our office at [phone] between the hours of 8:30 a.m. and 4:30 p.m.  Voicemails left after 4:30 p.m. will not be returned until the following business day.  For prescription refill requests, have your pharmacy contact our office.         Resources For Cancer Patients and their Caregivers ? American Cancer Society: Can assist with transportation, wigs, general needs, runs Look Good Feel Better.        (408)221-1437 ? Cancer Care: Provides financial assistance, online support groups,  medication/co-pay assistance.  1-800-813-HOPE (986) 711-5808) ? [place] Assists [place] cancer patients and their families through emotional , educational and financial support.  (270) 395-8630 ? [place] DSS Where to apply for food stamps, Medicaid and utility assistance. (548) 298-6339 ? RCATS: Transportation to medical appointments. 228-504-1162 ? Social Security Administration: May apply for disability if have a Stage IV cancer. 712-259-8374 908-337-0379 ? [employer], Disability and Transit Services: Assists with nutrition, care and transit needs. [place] Support Programs: @10RELATIVEDAYS @ > Cancer Support Group  2nd Tuesday of the month 1pm-2pm, Journey Room  > Creative Journey  3rd Tuesday of the month 1130am-1pm, Journey Room  > Look Good Feel Better  1st Wednesday of the month 10am-12 noon, Journey Room (Call [place] to register [phone])

## 2015-10-04 NOTE — Progress Notes (Signed)
Patient c/o nausea and dry heaving with little emesis r/t decreased PO intake.  Lorazepam and Decadron given for nausea per MD order.   Patient tolerated infusion well.  VSS.

## 2015-10-07 ENCOUNTER — Encounter (HOSPITAL_COMMUNITY): Payer: BLUE CROSS/BLUE SHIELD | Admitting: Hematology & Oncology

## 2015-10-07 ENCOUNTER — Encounter (HOSPITAL_BASED_OUTPATIENT_CLINIC_OR_DEPARTMENT_OTHER): Payer: BLUE CROSS/BLUE SHIELD

## 2015-10-07 VITALS — BP 143/78 | HR 90 | Temp 98.0°F | Resp 16 | Wt 164.4 lb

## 2015-10-07 DIAGNOSIS — R112 Nausea with vomiting, unspecified: Secondary | ICD-10-CM

## 2015-10-07 DIAGNOSIS — C090 Malignant neoplasm of tonsillar fossa: Secondary | ICD-10-CM | POA: Diagnosis not present

## 2015-10-07 MED ORDER — HEPARIN SOD (PORK) LOCK FLUSH 100 UNIT/ML IV SOLN
500.0000 [IU] | Freq: Once | INTRAVENOUS | Status: AC
  Administered 2015-10-07: 500 [IU] via INTRAVENOUS

## 2015-10-07 MED ORDER — SODIUM CHLORIDE 0.9 % IV SOLN
10.0000 mg | Freq: Once | INTRAVENOUS | Status: AC
  Administered 2015-10-07: 10 mg via INTRAVENOUS
  Filled 2015-10-07: qty 1

## 2015-10-07 MED ORDER — SODIUM CHLORIDE 0.9 % IV SOLN: INTRAVENOUS | Status: DC

## 2015-10-07 MED ORDER — SODIUM CHLORIDE 0.9 % IV SOLN
Freq: Once | INTRAVENOUS | Status: AC
  Administered 2015-10-07: 10:00:00 via INTRAVENOUS

## 2015-10-07 NOTE — Progress Notes (Signed)
This encounter was created in error - please disregard.

## 2015-10-07 NOTE — Patient Instructions (Signed)
[facility] at [facility] Discharge Instructions  RECOMMENDATIONS MADE BY THE CONSULTANT AND ANY TEST RESULTS WILL BE SENT TO YOUR REFERRING PHYSICIAN.  IV fluids today.    Thank you for choosing [facility] at [facility] to provide your oncology and hematology care.  To afford each patient quality time with our provider, please arrive at least 15 minutes before your scheduled appointment time.   Beginning January 23rd 2017 lab work for the Cancer Center will be done in the  Main lab at [place] on 1st floor. If you have a lab appointment with the Cancer Center please come in thru the  Main Entrance and check in at the main information desk  You need to re-schedule your appointment should you arrive 10 or more minutes late.  We strive to give you quality time with our providers, and arriving late affects you and other patients whose appointments are after yours.  Also, if you no show three or more times for appointments you may be dismissed from the clinic at the providers discretion.     Again, thank you for choosing [facility].  Our hope is that these requests will decrease the amount of time that you wait before being seen by our physicians.       _____________________________________________________________  Should you have questions after your visit to [facility], please contact our office at [phone] between the hours of 8:30 a.m. and 4:30 p.m.  Voicemails left after 4:30 p.m. will not be returned until the following business day.  For prescription refill requests, have your pharmacy contact our office.         Resources For Cancer Patients and their Caregivers ? American Cancer Society: Can assist with transportation, wigs, general needs, runs Look Good Feel Better.        1-888-227-6333 ? Cancer Care: Provides financial assistance, online support groups, medication/co-pay assistance.   1-800-813-HOPE (4673) ? [facility] Assists [place] cancer patients and their families through emotional , educational and financial support.  336-427-4357 ? [place] DSS Where to apply for food stamps, Medicaid and utility assistance. 336-342-1394 ? RCATS: Transportation to medical appointments. 336-347-2287 ? Social Security Administration: May apply for disability if have a Stage IV cancer. 336-342-7796 1-800-772-1213 ? [place] Aging, Disability and Transit Services: Assists with nutrition, care and transit needs. 336-349-2343  Cancer Center Support Programs: @10RELATIVEDAYS@ > Cancer Support Group  2nd Tuesday of the month 1pm-2pm, Journey Room  > Creative Journey  3rd Tuesday of the month 1130am-1pm, Journey Room  > Look Good Feel Better  1st Wednesday of the month 10am-12 noon, Journey Room (Call American Cancer Society to register 1-800-395-5775)    

## 2015-10-07 NOTE — Progress Notes (Signed)
Patient reports nausea with emesis repeatedly last night, but feeling no nausea at the present.  S[place]uso patch for nausea was discussed with the patient, but he is overwhelmed with medication at this time and doesn't want to add any further medication.  We discussed the benefit of the patch with nausea related to chemotherapy as well as just the amount of pain medication that he has to take to stay comfortable and the fact that he is not eating right now and that can be causing nausea as well.  He agrees, but would rather wait until the next chemotherapy treatment to use the patch, stating the the second and third week after treatment, which he is in week two, are not bad as far as nausea and vomiting.  PA and MD aware.    Patient tolerated fluids well.  VSS.

## 2015-10-09 ENCOUNTER — Ambulatory Visit (HOSPITAL_COMMUNITY): Payer: Self-pay

## 2015-10-09 ENCOUNTER — Other Ambulatory Visit (HOSPITAL_COMMUNITY): Payer: Self-pay | Admitting: Oncology

## 2015-10-09 ENCOUNTER — Encounter (HOSPITAL_COMMUNITY): Payer: Self-pay

## 2015-10-09 ENCOUNTER — Ambulatory Visit (HOSPITAL_COMMUNITY): Payer: Self-pay | Admitting: Oncology

## 2015-10-09 ENCOUNTER — Encounter (HOSPITAL_BASED_OUTPATIENT_CLINIC_OR_DEPARTMENT_OTHER): Payer: BLUE CROSS/BLUE SHIELD

## 2015-10-09 ENCOUNTER — Encounter: Payer: Self-pay | Admitting: Dietician

## 2015-10-09 DIAGNOSIS — R112 Nausea with vomiting, unspecified: Secondary | ICD-10-CM

## 2015-10-09 DIAGNOSIS — C090 Malignant neoplasm of tonsillar fossa: Secondary | ICD-10-CM

## 2015-10-09 MED ORDER — SODIUM CHLORIDE 0.9 % IV SOLN
INTRAVENOUS | Status: DC
  Administered 2015-10-09: 12:00:00 via INTRAVENOUS

## 2015-10-09 MED ORDER — HEPARIN SOD (PORK) LOCK FLUSH 100 UNIT/ML IV SOLN
500.0000 [IU] | Freq: Once | INTRAVENOUS | Status: AC
  Administered 2015-10-09: 500 [IU] via INTRAVENOUS

## 2015-10-09 MED ORDER — DEXAMETHASONE SODIUM PHOSPHATE 100 MG/10ML IJ SOLN
10.0000 mg | Freq: Once | INTRAMUSCULAR | Status: AC
Start: 2015-10-09 — End: 2015-10-09
  Administered 2015-10-09: 10 mg via INTRAVENOUS
  Filled 2015-10-09: qty 1

## 2015-10-09 MED ORDER — FENTANYL 50 MCG/HR TD PT72: 50.0000 ug | MEDICATED_PATCH | TRANSDERMAL | Status: DC

## 2015-10-09 MED ORDER — LORAZEPAM 0.5 MG PO TABS
0.5000 mg | ORAL_TABLET | ORAL | Status: DC
Start: 2015-10-09 — End: 2015-11-05

## 2015-10-09 NOTE — Patient Instructions (Signed)
[place] at [facility] Discharge Instructions  RECOMMENDATIONS MADE BY THE CONSULTANT AND ANY TEST RESULTS WILL BE SENT TO YOUR REFERRING PHYSICIAN.  IV fluids today Follow up as scheduled   Thank you for choosing [place] at [facility] to provide your oncology and hematology care.  To afford each patient quality time with our provider, please arrive at least 15 minutes before your scheduled appointment time.   Beginning January 23rd 2017 lab work for the [facility] will be done in the  Main lab at [facility] on 1st floor. If you have a lab appointment with the [facility] please come in thru the  Main Entrance and check in at the main information desk  You need to re-schedule your appointment should you arrive 10 or more minutes late.  We strive to give you quality time with our providers, and arriving late affects you and other patients whose appointments are after yours.  Also, if you no show three or more times for appointments you may be dismissed from the clinic at the providers discretion.     Again, thank you for choosing [facility].  Our hope is that these requests will decrease the amount of time that you wait before being seen by our physicians.       _____________________________________________________________  Should you have questions after your visit to [facility], please contact our office at [phone] between the hours of 8:30 a.m. and 4:30 p.m.  Voicemails left after 4:30 p.m. will not be returned until the following business day.  For prescription refill requests, have your pharmacy contact our office.         Resources For Cancer Patients and their Caregivers ? American Cancer Society: Can assist with transportation, wigs, general needs, runs Look Good Feel Better.        4081841050 ? Cancer Care: Provides financial assistance, online support groups, medication/co-pay  assistance.  1-800-813-HOPE 435 487 3766) ? [place] Assists [place] cancer patients and their families through emotional , educational and financial support.  (469) 037-5908 ? [place] DSS Where to apply for food stamps, Medicaid and utility assistance. (787)348-2830 ? RCATS: Transportation to medical appointments. (347)866-3708 ? Social Security Administration: May apply for disability if have a Stage IV cancer. 802 082 2123 610-764-7888 ? [employer], Disability and Transit Services: Assists with nutrition, care and transit needs. [place] Support Programs: @10RELATIVEDAYS @ > Cancer Support Group  2nd Tuesday of the month 1pm-2pm, Journey Room  > Creative Journey  3rd Tuesday of the month 1130am-1pm, Journey Room  > Look Good Feel Better  1st Wednesday of the month 10am-12 noon, Journey Room (Call [place] to register [phone])

## 2015-10-09 NOTE — Progress Notes (Signed)
Esophageal cancer pt, PEG feeder. F/U   Contacted Pt by visiting during fluid session  Wt Readings from Last 10 Encounters:  10/07/15 164 lb 6.4 oz (74.571 kg)  10/04/15 165 lb 12.8 oz (75.206 kg)  10/02/15 167 lb 3.2 oz (75.841 kg)  09/30/15 170 lb (77.111 kg)  09/27/15 171 lb 12.8 oz (77.928 kg)  09/25/15 170 lb (77.111 kg)  09/20/15 181 lb (82.101 kg)  09/20/15 172 lb 6.4 oz (78.2 kg)  09/18/15 172 lb 3.2 oz (78.109 kg)  09/11/15 175 lb 12.8 oz (79.742 kg)   Patient weight has continued to decrease. He was able to maintain fairly well, but recently the rate of wt loss has increased. He has lost ~15-20 lbs since treatment began.  Today pt looks noticeably more thin, specifically in the face. Has widespread erythema to neck and upper thorax.    Pt says he is only using 6 cans a day. This is the same amount of cans he was using when I followed up with him <1 month ago at which time he was reeducated the goal was 8 cans. He has no PO intake. He will take sips, but that is it. Given his noncompliance w/ goal regimen, progression w/ treatment, and no PO intake it is not surprising he has lost wt.   He is essentially done with radiation, however, he still has another chemo cycle 5/30.   RD, again, emphasized the need to increase TF intake. He is losing a lb every few days. Stressed ATLEAST getting to 7 cans consistently.   He is just about out of TF and was asking how much TF he should order. It sounds like the chemo induced mucositis is the bigger inhibitor of PO intake then the radiation. Given he still has more chemo, estimated 2 months worth of TF to be safe. He has met w/ SLP.   He stated RD needed to f/u w/ [facility] in order to receive TF. Will call [facility].   [name] RD, LDN Nutrition Pager: [phone] 10/09/2015 1:57 PM

## 2015-10-09 NOTE — Progress Notes (Signed)
Pt was constipated, told to add senkot S to regimen up to 4 tablets twice daily, wanted a refill on a ativan today.  Something else besides diflucan.  He is about to run out of jevity (nathan cam to speak to him about that).  Wanted to know if he could have steriods today.  Spoke with dr Whitney Muse.  Steriods ordered, ativan refilled, increased his fentanyl patch to 50 mcg, wanted pt to use baking soda rinses and magic mouth washes.  Not nystatin or diflucan long term.    Frank Castillo Tolerated IV fluids well today

## 2015-10-10 NOTE — Assessment & Plan Note (Addendum)
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  Treatment course complicated by severe chemoradiation-induced stomatitis.  Oncology history updated.  IV fluids today as planned.  Continue IV Fluids M-W-F until patient returns in 2 weeks for follow-up OR if patient begins gaining the ability to hydrate better on his own.  He notes an instance on Sunday where his tinnitus increased. He notes that today it's back to baseline.  I have refilled his Zofran.  I have discussed the patient's left upper lip skin nodule with Dr. Whitney Muse who prescribed Clindamycin gel.  This was not effective for the patient.  We recommend he just follow it along.  Return as scheduled for follow-up with continuation of IV fluids and supportive care.

## 2015-10-10 NOTE — Progress Notes (Signed)
No primary care provider on file. No primary provider on file.  Cancer of tonsillar fossa (HCC)  Squamous cell carcinoma of left tonsil (HCC) - Plan: ondansetron (ZOFRAN ODT) 8 MG disintegrating tablet  CURRENT THERAPY: Supportive care and surveillance.  INTERVAL HISTORY: Frank Castillo 65 y.o. 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  Treatment course complicated by severe chemoradiation-induced stomatitis.    Cancer of tonsillar fossa ([place])   08/02/2015 Initial Diagnosis Cancer of tonsillar fossa ([place])   08/20/2015 Procedure Port-a-cath placed by IR   08/20/2015 Procedure G-tube placed by IR   08/21/2015 -  Radiation Therapy Dr. Isidore Moos   08/21/2015 - 09/10/2015 Chemotherapy Cisplatin every 21 days with XRT.   08/28/2015 Adverse Reaction Tinnitis, Cisplatin-induced.   09/11/2015 - 09/30/2015 Chemotherapy Carboplatin/5FU   09/20/2015 - 09/21/2015 Hospital Admission Intractable nausea/vomiting. Severe Mucositis   I personally reviewed and went over laboratory results with the patient.  The results are noted within this dictation.    He is doing much better. He is handling his secretions better. He notes that he was able to have lukewarm coffee this morning which is new for him. He's had the thought of consuming cream of wheat. He notes that his appetite isn't back, but he is noticing cravings for particular foods he was unable to consume previously. Overall, he is recovering from treatment. He looks well today. He does request a refill on Zofran and I'll take care of that today.  He notes on Sunday and increase in tinnitus but that back to baseline.  Review of Systems  Constitutional: Negative.  Negative for fever and  chills.  HENT: Positive for tinnitus (Cisplatin-induced).   Eyes: Negative.   Respiratory: Negative.   Cardiovascular: Negative.   Gastrointestinal: Negative.  Negative for nausea and vomiting.  Genitourinary: Negative.   Musculoskeletal: Negative.   Skin: Negative.   Neurological: Negative.   Endo/Heme/Allergies: Negative.   Psychiatric/Behavioral: Negative.     Past Medical History  Diagnosis Date  . Tonsillar mass     left  . Portal hypertension ([place])   . Hypertension   . Cirrhosis [facility])     Past Surgical History  Procedure Laterality Date  . Hernia repair Right   . Mouth surgery    . Tonsillectomy Left 07/09/2015    Procedure: LEFT TONSILLECTOMY;  Surgeon: Leta Baptist, MD;  Location: [place];  Service: ENT;  Laterality: Left;    Family History  Problem Relation Age of Onset  . Macular degeneration Mother   . Alcoholism Father     Social History   Social History  . Marital Status: Single    Spouse Name: N/A  . Number of Children: 0  . Years of Education: N/A   Social History Main Topics  . Smoking status: Current Every Day Smoker -- 0.50 packs/day    Types: Cigarettes  . Smokeless tobacco: Never Used  . Alcohol Use: No     Comment: former abuse- quit 09/2011  . Drug Use: No  . Sexual Activity: Not on file   Other Topics Concern  . Not on file   Social History Narrative   Divorced.   No children.   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   Currently smoking  approximately one pack per day.   Has been smoking off/on since age 21.     PHYSICAL EXAMINATION  ECOG PERFORMANCE STATUS: 1 - Symptomatic but completely ambulatory  Filed Vitals:   10/11/15 0946  BP: 127/71  Pulse: 79  Temp: 97.1 F (36.2 C)    GENERAL:alert, no distress, comfortable, cooperative, smiling and accompanied by sister SKIN: skin color, texture, turgor are normal, no rashes or significant lesions.  Left skin nodule with erythema measuring ~ 5 mm  in size located superior to left upper lip. HEAD: Normocephalic, No masses, lesions, tenderness or abnormalities EYES: normal, EOMI, Conjunctiva are pink and non-injected EARS: External ears normal OROPHARYNX:lips, buccal mucosa, and tongue normal and mucous membranes are moist  NECK: supple, trachea midline. Neck is erythematous LYMPH:  not examined BREAST:not examined LUNGS: clear to auscultation  HEART: regular rate & rhythm ABDOMEN:abdomen soft and normal bowel sounds BACK: Back symmetric, no curvature. EXTREMITIES:less then 2 second capillary refill, no skin discoloration, no cyanosis  NEURO: alert & oriented x 3 with fluent speech, no focal motor/sensory deficits, gait normal   LABORATORY DATA: CBC    Component Value Date/Time   WBC 4.2 09/30/2015 0910   WBC 9.0 10/21/2011 1614   RBC 3.58* 09/30/2015 0910   RBC 3.42* 10/21/2011 1614   HGB 10.9* 09/30/2015 0910   HGB 11.5* 10/21/2011 1614   HCT 32.3* 09/30/2015 0910   HCT 34.6* 10/21/2011 1614   PLT 151 09/30/2015 0910   PLT 209 10/21/2011 1614   MCV 90.2 09/30/2015 0910   MCV 101* 10/21/2011 1614   MCH 30.4 09/30/2015 0910   MCH 33.7 10/21/2011 1614   MCHC 33.7 09/30/2015 0910   MCHC 33.3 10/21/2011 1614   RDW 16.0* 09/30/2015 0910   RDW 14.0 10/21/2011 1614   LYMPHSABS 0.6* 09/30/2015 0910   LYMPHSABS 1.9 10/20/2011 0609   MONOABS 0.3 09/30/2015 0910   MONOABS 1.1* 10/20/2011 0609   EOSABS 0.1 09/30/2015 0910   EOSABS 0.1 10/20/2011 0609   BASOSABS 0.0 09/30/2015 0910   BASOSABS 0.1 10/20/2011 0609      Chemistry      Component Value Date/Time   NA 134* 09/30/2015 0910   NA 132* 10/21/2011 1614   K 4.3 09/30/2015 0910   K 3.6 10/21/2011 1614   CL 99* 09/30/2015 0910   CL 92* 10/21/2011 1614   CO2 27 09/30/2015 0910   CO2 32 10/21/2011 1614   BUN 20 09/30/2015 0910   BUN 7 10/21/2011 1614   CREATININE 0.86 09/30/2015 0910   CREATININE 0.73 10/21/2011 1614      Component Value Date/Time   CALCIUM  8.9 09/30/2015 0910   CALCIUM 7.9* 10/21/2011 1614   ALKPHOS 39 09/30/2015 0910   ALKPHOS 83 10/21/2011 1614   AST 11* 09/30/2015 0910   AST 97* 10/21/2011 1614   ALT 11* 09/30/2015 0910   ALT 34 10/21/2011 1614   BILITOT 0.3 09/30/2015 0910   BILITOT 1.1* 10/21/2011 1614        PENDING LABS:   RADIOGRAPHIC STUDIES:  No results found.   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  Treatment course complicated by severe chemoradiation-induced stomatitis.  Oncology history updated.  IV fluids today as planned.  Continue IV Fluids  M-W-F until patient returns in 2 weeks for follow-up OR if patient begins gaining the ability to hydrate better on his own.  He notes an instance on Sunday where his tinnitus increased. He notes that today it's back to baseline.  I have refilled his Zofran.  I have discussed the patient's left upper lip skin nodule with Dr. Whitney Muse who prescribed Clindamycin gel.  This was not effective for the patient.  We recommend he just follow it along.  Return as scheduled for follow-up with continuation of IV fluids and supportive care.    ORDERS PLACED FOR THIS ENCOUNTER: No orders of the defined types were placed in this encounter.    MEDICATIONS PRESCRIBED THIS ENCOUNTER: Meds ordered this encounter  Medications  . ondansetron (ZOFRAN ODT) 8 MG disintegrating tablet    Sig: Take 1 tablet (8 mg total) by mouth every 8 (eight) hours as needed for nausea or vomiting.    Dispense:  30 tablet    Refill:  2    Order Specific Question:  Supervising Provider    Answer:  Patrici Ranks [id number]    THERAPY PLAN:  Supportive care, followed by  surveillance per NCCN guidelines.  All questions were answered. The patient knows to call the clinic with any problems, questions or concerns. We can certainly see the patient much sooner if necessary.  Patient and plan discussed with Dr. Ancil Linsey and she is in agreement with the aforementioned.   This note is electronically signed by: Doy Mince 10/11/2015 3:22 PM

## 2015-10-11 ENCOUNTER — Encounter (HOSPITAL_BASED_OUTPATIENT_CLINIC_OR_DEPARTMENT_OTHER): Payer: BLUE CROSS/BLUE SHIELD

## 2015-10-11 ENCOUNTER — Encounter (HOSPITAL_BASED_OUTPATIENT_CLINIC_OR_DEPARTMENT_OTHER): Payer: BLUE CROSS/BLUE SHIELD | Admitting: Oncology

## 2015-10-11 ENCOUNTER — Ambulatory Visit (HOSPITAL_COMMUNITY): Payer: Self-pay

## 2015-10-11 ENCOUNTER — Telehealth (HOSPITAL_COMMUNITY): Payer: Self-pay | Admitting: *Deleted

## 2015-10-11 VITALS — BP 127/71 | HR 79 | Temp 97.1°F

## 2015-10-11 DIAGNOSIS — C090 Malignant neoplasm of tonsillar fossa: Secondary | ICD-10-CM | POA: Diagnosis not present

## 2015-10-11 DIAGNOSIS — C099 Malignant neoplasm of tonsil, unspecified: Secondary | ICD-10-CM

## 2015-10-11 MED ORDER — HEPARIN SOD (PORK) LOCK FLUSH 100 UNIT/ML IV SOLN
500.0000 [IU] | Freq: Once | INTRAVENOUS | Status: AC
  Administered 2015-10-11: 500 [IU] via INTRAVENOUS
  Filled 2015-10-11: qty 5

## 2015-10-11 MED ORDER — ONDANSETRON 8 MG PO TBDP: 8.0000 mg | ORAL_TABLET | Freq: Three times a day (TID) | ORAL | Status: DC | PRN

## 2015-10-11 MED ORDER — SODIUM CHLORIDE 0.9 % IV SOLN
Freq: Once | INTRAVENOUS | Status: AC
  Administered 2015-10-11: 10:00:00 via INTRAVENOUS

## 2015-10-11 NOTE — Telephone Encounter (Signed)
Pt notified that Dr. Whitney Muse wants to watch the area above his lip at this time and doesn't want to take any other measures. Pt said ok.

## 2015-10-11 NOTE — Patient Instructions (Signed)
[place] at [facility] Discharge Instructions  RECOMMENDATIONS MADE BY THE CONSULTANT AND ANY TEST RESULTS WILL BE SENT TO YOUR REFERRING PHYSICIAN.  Congratulations on completing chemotherapy. Congratulation on your upcoming XRT completion. We will continue with IV fluids M-W-F.  This will be re-evaluated in the future. Return as scheduled for follow-up.  Thank you for choosing [place] at [facility] to provide your oncology and hematology care.  To afford each patient quality time with our provider, please arrive at least 15 minutes before your scheduled appointment time.   Beginning January 23rd 2017 lab work for the [facility] will be done in the  Main lab at [facility] on 1st floor. If you have a lab appointment with the [facility] please come in thru the  Main Entrance and check in at the main information desk  You need to re-schedule your appointment should you arrive 10 or more minutes late.  We strive to give you quality time with our providers, and arriving late affects you and other patients whose appointments are after yours.  Also, if you no show three or more times for appointments you may be dismissed from the clinic at the providers discretion.     Again, thank you for choosing [facility].  Our hope is that these requests will decrease the amount of time that you wait before being seen by our physicians.       _____________________________________________________________  Should you have questions after your visit to [facility], please contact our office at [phone] between the hours of 8:30 a.m. and 4:30 p.m.  Voicemails left after 4:30 p.m. will not be returned until the following business day.  For prescription refill requests, have your pharmacy contact our office.         Resources For Cancer Patients and their Caregivers ? American Cancer Society: Can assist with  transportation, wigs, general needs, runs Look Good Feel Better.        763-638-6956 ? Cancer Care: Provides financial assistance, online support groups, medication/co-pay assistance.  1-800-813-HOPE 501-751-2744) ? [facility] Assists [place] cancer patients and their families through emotional , educational and financial support.  954-431-8816 ? [place] DSS Where to apply for food stamps, Medicaid and utility assistance. 3646170844 ? RCATS: Transportation to medical appointments. 820-065-1390 ? Social Security Administration: May apply for disability if have a Stage IV cancer. 934-859-9298 (671)836-5277 ? [employer], Disability and Transit Services: Assists with nutrition, care and transit needs. [place] Support Programs: @10RELATIVEDAYS @ > Cancer Support Group  2nd Tuesday of the month 1pm-2pm, Journey Room  > Creative Journey  3rd Tuesday of the month 1130am-1pm, Journey Room  > Look Good Feel Better  1st Wednesday of the month 10am-12 noon, Journey Room (Call [place] to register [phone])

## 2015-10-14 ENCOUNTER — Other Ambulatory Visit (HOSPITAL_COMMUNITY): Payer: Self-pay | Admitting: Oncology

## 2015-10-14 ENCOUNTER — Encounter (HOSPITAL_BASED_OUTPATIENT_CLINIC_OR_DEPARTMENT_OTHER): Payer: BLUE CROSS/BLUE SHIELD

## 2015-10-14 VITALS — BP 143/74 | HR 93 | Temp 99.2°F | Resp 18 | Wt 164.2 lb

## 2015-10-14 DIAGNOSIS — C090 Malignant neoplasm of tonsillar fossa: Secondary | ICD-10-CM

## 2015-10-14 DIAGNOSIS — B370 Candidal stomatitis: Secondary | ICD-10-CM

## 2015-10-14 MED ORDER — HEPARIN SOD (PORK) LOCK FLUSH 100 UNIT/ML IV SOLN
500.0000 [IU] | Freq: Once | INTRAVENOUS | Status: AC
  Administered 2015-10-14: 500 [IU] via INTRAVENOUS
  Filled 2015-10-14: qty 5

## 2015-10-14 MED ORDER — SODIUM CHLORIDE 0.9 % IV SOLN
Freq: Once | INTRAVENOUS | Status: AC
  Administered 2015-10-14: 10:00:00 via INTRAVENOUS

## 2015-10-14 MED ORDER — FIRST-DUKES MOUTHWASH MT SUSP: 5.0000 mL | Freq: Three times a day (TID) | OROMUCOSAL | Status: DC

## 2015-10-14 MED ORDER — OXYCODONE HCL 5 MG/5ML PO SOLN: 5.0000 mg | ORAL | Status: DC | PRN

## 2015-10-14 NOTE — Progress Notes (Signed)
Patient tolerated infusion well.  VSS.   

## 2015-10-14 NOTE — Patient Instructions (Signed)
[facility] at [place] at [facility] Discharge Instructions  RECOMMENDATIONS MADE BY THE CONSULTANT AND ANY TEST RESULTS WILL BE SENT TO YOUR REFERRING PHYSICIAN.  IV fluids today.    Thank you for choosing [facility] at [facility] to provide your oncology and hematology care.  To afford each patient quality time with our provider, please arrive at least 15 minutes before your scheduled appointment time.   Beginning January 23rd 2017 lab work for the Cancer Center will be done in the  Main lab at [place] on 1st floor. If you have a lab appointment with the Cancer Center please come in thru the  Main Entrance and check in at the main information desk  You need to re-schedule your appointment should you arrive 10 or more minutes late.  We strive to give you quality time with our providers, and arriving late affects you and other patients whose appointments are after yours.  Also, if you no show three or more times for appointments you may be dismissed from the clinic at the providers discretion.     Again, thank you for choosing [facility].  Our hope is that these requests will decrease the amount of time that you wait before being seen by our physicians.       _____________________________________________________________  Should you have questions after your visit to [facility], please contact our office at [phone] between the hours of 8:30 a.m. and 4:30 p.m.  Voicemails left after 4:30 p.m. will not be returned until the following business day.  For prescription refill requests, have your pharmacy contact our office.         Resources For Cancer Patients and their Caregivers ? American Cancer Society: Can assist with transportation, wigs, general needs, runs Look Good Feel Better.        1-888-227-6333 ? Cancer Care: Provides financial assistance, online support groups, medication/co-pay assistance.   1-800-813-HOPE (4673) ? [facility] Assists [place] cancer patients and their families through emotional , educational and financial support.  336-427-4357 ? [place] DSS Where to apply for food stamps, Medicaid and utility assistance. 336-342-1394 ? RCATS: Transportation to medical appointments. 336-347-2287 ? Social Security Administration: May apply for disability if have a Stage IV cancer. 336-342-7796 1-800-772-1213 ? [place] Aging, Disability and Transit Services: Assists with nutrition, care and transit needs. 336-349-2343  Cancer Center Support Programs: @10RELATIVEDAYS@ > Cancer Support Group  2nd Tuesday of the month 1pm-2pm, Journey Room  > Creative Journey  3rd Tuesday of the month 1130am-1pm, Journey Room  > Look Good Feel Better  1st Wednesday of the month 10am-12 noon, Journey Room (Call American Cancer Society to register 1-800-395-5775)    

## 2015-10-15 ENCOUNTER — Encounter (HOSPITAL_COMMUNITY): Payer: Self-pay | Admitting: Hematology & Oncology

## 2015-10-15 ENCOUNTER — Encounter (HOSPITAL_BASED_OUTPATIENT_CLINIC_OR_DEPARTMENT_OTHER): Payer: BLUE CROSS/BLUE SHIELD | Admitting: Hematology & Oncology

## 2015-10-15 ENCOUNTER — Encounter (HOSPITAL_COMMUNITY): Payer: BLUE CROSS/BLUE SHIELD

## 2015-10-15 VITALS — BP 124/70 | HR 85 | Temp 98.8°F | Resp 20

## 2015-10-15 DIAGNOSIS — C090 Malignant neoplasm of tonsillar fossa: Secondary | ICD-10-CM

## 2015-10-15 DIAGNOSIS — B3781 Candidal esophagitis: Secondary | ICD-10-CM | POA: Diagnosis not present

## 2015-10-15 DIAGNOSIS — R509 Fever, unspecified: Secondary | ICD-10-CM

## 2015-10-15 DIAGNOSIS — C099 Malignant neoplasm of tonsil, unspecified: Secondary | ICD-10-CM | POA: Diagnosis not present

## 2015-10-15 DIAGNOSIS — R1310 Dysphagia, unspecified: Secondary | ICD-10-CM

## 2015-10-15 LAB — CBC WITH DIFFERENTIAL/PLATELET
BASOS ABS: 0 10*3/uL (ref 0.0–0.1)
Basophils Relative: 0 %
EOS PCT: 0 %
Eosinophils Absolute: 0 10*3/uL (ref 0.0–0.7)
HCT: 25.6 % — ABNORMAL LOW (ref 39.0–52.0)
HEMOGLOBIN: 8.5 g/dL — AB (ref 13.0–17.0)
LYMPHS ABS: 0.6 10*3/uL — AB (ref 0.7–4.0)
Lymphocytes Relative: 20 %
MCH: 30.6 pg (ref 26.0–34.0)
MCHC: 33.2 g/dL (ref 30.0–36.0)
MCV: 92.1 fL (ref 78.0–100.0)
MONO ABS: 0.6 10*3/uL (ref 0.1–1.0)
Monocytes Relative: 20 %
Neutro Abs: 1.9 10*3/uL (ref 1.7–7.7)
Neutrophils Relative %: 60 %
Platelets: 147 10*3/uL — ABNORMAL LOW (ref 150–400)
RBC: 2.78 MIL/uL — AB (ref 4.22–5.81)
RDW: 17.7 % — ABNORMAL HIGH (ref 11.5–15.5)
WBC: 3.1 10*3/uL — AB (ref 4.0–10.5)

## 2015-10-15 LAB — COMPREHENSIVE METABOLIC PANEL
ALK PHOS: 34 U/L — AB (ref 38–126)
ALT: 10 U/L — AB (ref 17–63)
AST: 11 U/L — AB (ref 15–41)
Albumin: 3 g/dL — ABNORMAL LOW (ref 3.5–5.0)
Anion gap: 5 (ref 5–15)
BILIRUBIN TOTAL: 0.4 mg/dL (ref 0.3–1.2)
BUN: 16 mg/dL (ref 6–20)
CO2: 29 mmol/L (ref 22–32)
CREATININE: 0.67 mg/dL (ref 0.61–1.24)
Calcium: 8.2 mg/dL — ABNORMAL LOW (ref 8.9–10.3)
Chloride: 97 mmol/L — ABNORMAL LOW (ref 101–111)
GFR calc Af Amer: >60 mL/min (ref >60)
Glucose, Bld: 120 mg/dL — ABNORMAL HIGH (ref 65–99)
Potassium: 4.1 mmol/L (ref 3.5–5.1)
Sodium: 131 mmol/L — ABNORMAL LOW (ref 135–145)
TOTAL PROTEIN: 6.2 g/dL — AB (ref 6.5–8.1)

## 2015-10-15 LAB — MAGNESIUM: MAGNESIUM: 1.8 mg/dL (ref 1.7–2.4)

## 2015-10-15 MED ORDER — MORPHINE SULFATE (PF) 2 MG/ML IV SOLN
4.0000 mg | Freq: Once | INTRAVENOUS | Status: AC
  Administered 2015-10-15: 4 mg via INTRAVENOUS

## 2015-10-15 MED ORDER — MORPHINE SULFATE 4 MG/ML IJ SOLN
4.0000 mg | Freq: Once | INTRAMUSCULAR | Status: DC
  Filled 2015-10-15: qty 1

## 2015-10-15 MED ORDER — MORPHINE SULFATE (PF) 2 MG/ML IV SOLN
INTRAVENOUS | Status: AC
  Filled 2015-10-15: qty 2

## 2015-10-15 MED ORDER — FLUCONAZOLE IN SODIUM CHLORIDE 400-0.9 MG/200ML-% IV SOLN
200.0000 mg | INTRAVENOUS | Status: DC
  Administered 2015-10-15: 200 mg via INTRAVENOUS
  Filled 2015-10-15 (×2): qty 100

## 2015-10-15 MED ORDER — SODIUM CHLORIDE 0.9% FLUSH
10.0000 mL | INTRAVENOUS | Status: DC | PRN
  Administered 2015-10-15: 10 mL via INTRAVENOUS
  Filled 2015-10-15: qty 10

## 2015-10-15 MED ORDER — HEPARIN SOD (PORK) LOCK FLUSH 100 UNIT/ML IV SOLN
500.0000 [IU] | Freq: Once | INTRAVENOUS | Status: AC
  Administered 2015-10-15: 500 [IU] via INTRAVENOUS
  Filled 2015-10-15: qty 5

## 2015-10-15 MED ORDER — SODIUM CHLORIDE 0.9 % IV SOLN
INTRAVENOUS | Status: DC
  Administered 2015-10-15: 13:00:00 via INTRAVENOUS

## 2015-10-15 NOTE — Progress Notes (Signed)
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 He is seen at [facility], Hepatitis C negative CT neck with contrast 06/25/2015 at [place] level 2 malignant LAD, largest node is 3.5 cm long axis. R worse than L carotid atherosclerosis and stenosis. CT chest with no acute or significant findings within the chest    Cancer of tonsillar fossa ([place])   08/02/2015 Initial Diagnosis Cancer of tonsillar fossa ([place])   08/20/2015 Procedure Port-a-cath placed by IR   08/20/2015 Procedure G-tube placed by IR   08/21/2015 - 10/09/2015 Radiation Therapy 70 Gy, Dr. Isidore Moos.   08/21/2015 - 09/10/2015 Chemotherapy Cisplatin every 21 days with XRT.   08/28/2015 Adverse Reaction Tinnitis, Cisplatin-induced.   09/11/2015 - 09/30/2015 Chemotherapy Carboplatin/5FU   09/20/2015 - 09/21/2015 Hospital Admission Intractable nausea/vomiting. Severe Mucositis    HISTORY OF PRESENTING ILLNESS:  Dene Nazir 65 y.o. male is here for further follow-up of Stage IVA T2N2bM0 squamous cell Ca of the L tonsil. Left tonsillectomy performed on 07/09/2015 with Dr. Benjamine Mola.  Mr. Weihe is accompanied by his sister. He has completed therapy. Was doing better until last pm.   He notes fever last night to 101. Fever this am of 100. None since. Throat is becoming "more sore to swallow" Secretions are "less thick." nausea is overall improved.  He presents today as a work in given above complaints.  He continues on fentanyl for pain. He is sleeping somewhat better at night.   MEDICAL HISTORY:  Past Medical History  Diagnosis Date  . Tonsillar mass     left  . Portal hypertension ([place])   . Hypertension   .  Cirrhosis ([place])     SURGICAL HISTORY: Past Surgical History  Procedure Laterality Date  . Hernia repair Right   . Mouth surgery    . Tonsillectomy Left 07/09/2015    Procedure: LEFT TONSILLECTOMY;  Surgeon: Leta Baptist, MD;  Location: [place];  Service: ENT;  Laterality: Left;    SOCIAL HISTORY: Social History   Social History  . Marital Status: Single    Spouse Name: N/A  . Number of Children: 0  . Years of Education: N/A   Occupational History  . Not on file.   Social History Main Topics  . Smoking status: Current Every Day Smoker -- 0.50 packs/day    Types: Cigarettes  . Smokeless tobacco: Never Used  . Alcohol Use: No     Comment: former abuse- quit 09/2011  . Drug Use: No  . Sexual Activity: Not on file   Other Topics Concern  . Not on file   Social History Narrative   Divorced.   No children.   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   Currently smoking approximately one pack per day.   Has been smoking off/on since age 3.   Divorced No children. Just step-children. 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 Previously worked in [employer]. Originally from [place], [place]. Current smoker at 1 ppd. Began smoking at 26-17 yo, regularly smoking at [age] yo. During his "cocaine years" he smoked about 2 ppd. ETOH, recovering alcoholic. 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 He enjoys reading. 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  FAMILY HISTORY: Family  History  Problem Relation Age of Onset  . Macular degeneration Mother   . Alcoholism Father    indicated that his mother is alive. He indicated that his father is deceased.   Mother still living at 53 years-old, with macular degeneration. She has no other health issues. Father deceased. Parents divorced at 54 years-old, so he is not sure what he died from. Father was an alcoholic. 4 sisters. 1 sister died of suicide. 1 sister was adopted. 1 Sister lives in [place]. 1 Sister  lives in [place].  ALLERGIES:  is allergic to bee venom.  MEDICATIONS:  Current Outpatient Prescriptions  Medication Sig Dispense Refill  . CARBOPLATIN IV Inject into the vein. Once every 21 days x 3 cycles    . cetirizine (ZYRTEC) 10 MG tablet Take 10 mg by mouth daily.    . cholecalciferol (VITAMIN D) 1000 units tablet Take 1,000 Units by mouth daily.    . clindamycin (CLINDAGEL) 1 % gel Apply topically 2 (two) times daily. 60 g 0  . Diphenhyd-Hydrocort-Nystatin (FIRST-DUKES MOUTHWASH) SUSP Use as directed 5 mLs in the mouth or throat 4 (four) times daily -  before meals and at bedtime. 240 mL 1  . fentaNYL (DURAGESIC - DOSED MCG/HR) 50 MCG/HR Place 1 patch (50 mcg total) onto the skin every 3 (three) days. 10 patch 0  . guaiFENesin (ROBITUSSIN) 100 MG/5ML SOLN Take 5 mLs (100 mg total) by mouth every 6 (six) hours as needed for cough or to loosen phlegm. 240 mL 1  . lidocaine (XYLOCAINE) 2 % solution Use as directed 20 mLs in the mouth or throat every 4 (four) hours as needed for mouth pain.   4  . lidocaine-prilocaine (EMLA) cream Apply a quarter size amount to port site 1 hour prior to chemo. Do not rub in. Cover with plastic wrap. 30 g 3  . lisinopril-hydrochlorothiazide (PRINZIDE,ZESTORETIC) 20-25 MG tablet Take 1 tablet by mouth daily. Reported on 09/25/2015  11  . LORazepam (ATIVAN) 0.5 MG tablet Take 1 tablet (0.5 mg total) by mouth every 4 (four) hours. 30 tablet 1  . Melatonin 10 MG TABS Take 1 tablet by mouth daily as needed (sleep). Reported on 09/25/2015    . metroNIDAZOLE (FLAGYL) 500 MG tablet TAKE 1 TABLET BY MOUTH THREE TIMES DAILY 30 tablet 0  . mupirocin ointment (BACTROBAN) 2 % Place 1 application into the nose 2 (two) times daily. (Patient taking differently: Place 1 application into the nose 2 (two) times daily. Apply to rash on thighs and groin) 22 g 1  . nicotine (NICODERM CQ) 14 mg/24hr patch apply 21 mg patch daily x6wk, then 14 mg patch daily x2wk, then 7 mg patch daily  x2wk 14 patch 0  . nicotine (NICODERM CQ) 21 mg/24hr patch apply 21 mg patch daily x6wk, then 14 mg patch daily x2wk, then 7 mg patch daily x2wk (Patient not taking: Reported on 09/25/2015) 21 patch 1  . nicotine (NICODERM CQ) 7 mg/24hr patch apply 21 mg patch daily x6wk, then 14 mg patch daily x2wk, then 7 mg patch daily x2wk (Patient not taking: Reported on 09/25/2015) 14 patch 0  . Nutritional Supplements (JEVITY 1.2 CAL/FIBER) LIQD 8 cans/day.  4 feeding of 2 cans each. Flush with 100 mls before and after each feeding 1000 mL 6  . nystatin (MYCOSTATIN) 100000 UNIT/ML suspension Take 5 mLs (500,000 Units total) by mouth 4 (four) times daily. 120 mL 1  . ondansetron (ZOFRAN ODT) 8 MG disintegrating tablet Take 1 tablet (8  mg total) by mouth every 8 (eight) hours as needed for nausea or vomiting. 30 tablet 2  . oxyCODONE (ROXICODONE) 5 MG/5ML solution Take 5-10 mLs (5-10 mg total) by mouth every 4 (four) hours as needed for severe pain. 250 mL 0  . pantoprazole (PROTONIX) 40 MG tablet Take 1 tablet (40 mg total) by mouth daily. 30 tablet 3  . prochlorperazine (COMPAZINE) 10 MG tablet Take 1 tablet (10 mg total) by mouth every 6 (six) hours as needed for nausea or vomiting. 30 tablet 2  . silver sulfADIAZINE (SILVADENE) 1 % cream   4  . sodium fluoride (FLUORISHIELD) 1.1 % GEL dental gel Instill one drop of gel per tooth space of fluoride tray. Place over teeth for 5 minutes. Remove. Spit out excess. Repeat nightly. 120 mL prn  . sucralfate (CARAFATE) 1 g tablet Reported on 09/25/2015  0  . traMADol (ULTRAM) 50 MG tablet Take 1 tablet (50 mg total) by mouth every 6 (six) hours as needed. (Patient not taking: Reported on 09/25/2015) 60 tablet 1   Current Facility-Administered Medications  Medication Dose Route Frequency Provider Last Rate Last Dose  . 0.9 %  sodium chloride infusion   Intravenous Continuous Patrici Ranks, MD 10 mL/hr at 10/15/15 1310    . fluconazole (DIFLUCAN) IVPB 200 mg  200 mg  Intravenous Q24H Patrici Ranks, MD   200 mg at 10/15/15 1313  . sodium chloride flush (NS) 0.9 % injection 10 mL  10 mL Intravenous PRN Patrici Ranks, MD   10 mL at 10/15/15 1309    Review of Systems  Constitutional: Positive for malaise/fatigue. Negative for fever, chills.  HENT: Positive for mouth pain, and sore throat. Negative for congestion, hearing loss and nosebleeds.   Mouth pain, pain with swallowing which has worsening over past few days Eyes: Negative.  Negative for blurred vision, double vision, pain and discharge.  Respiratory: Positive for sputum production, less thick  Negative for cough, hemoptysis, shortness of breath and wheezing.   Has thick secretions. Continues to swish soda.  Cardiovascular: Negative.  Negative for chest pain, palpitations, claudication, leg swelling and PND.  Gastrointestinal: Positive for nausea,  improving  Negative for heartburn, vomiting, abdominal pain, blood in stool and melena.  Nausea  Genitourinary: Negative.  Negative for dysuria, urgency, frequency and hematuria.  Musculoskeletal: Negative.  Negative for myalgias, joint pain and falls.  Skin: Negative.  Negative for itching and rash.  Neurological: Negative.  Negative for dizziness, tingling, tremors, sensory change, speech change, focal weakness, seizures, loss of consciousness, weakness and headaches.  Endo/Heme/Allergies: Negative.  Does not bruise/bleed easily.  Psychiatric/Behavioral: Positive for insomnia.  Negative for depression, suicidal ideas, memory loss and substance abuse. The patient is not nervous/anxious. Trouble sleeping due to mouth pain,  mouth secretions. All other systems reviewed and are negative.  14 point ROS was done and is otherwise as detailed above or in HPI   PHYSICAL EXAMINATION: ECOG PERFORMANCE STATUS: 0 - Asymptomatic   Vitals - 1 value per visit 0/93/8182  SYSTOLIC 993  DIASTOLIC 70  Pulse 85  Temperature 98.8  Respirations 20    Physical  Exam  Constitutional: He is oriented to person, place, and time  and in no distress.  HENT:  Head: Normocephalic and atraumatic.  Nose: Nose normal.  Mouth/Throat: Mucositis noted. Sides of tongue quite "raw' erythema and plaque. Significant oral thrush, buccal mucosa, roof of mouth, posterior oropharynx Eyes: Conjunctivae and EOM are normal. Pupils are equal, round, and  reactive to light. Right eye exhibits no discharge. Left eye exhibits no discharge. No scleral icterus.  Neck: Normal range of motion. Neck supple. No tracheal deviation present. No thyromegaly present.  Significant bilateral XRT changes. Skin is well taken care of Cardiovascular: Normal rate, regular rhythm and normal heart sounds.  Exam reveals no gallop and no friction rub.  Port site is C/D/I no erythema or bruising No murmur heard.  Pulmonary/Chest: Effort normal and breath sounds normal. He has no wheezes. He has no rales.  Abdominal: Soft. Bowel sounds are normal. He exhibits no distension and no mass. There is no tenderness. There is no rebound and no guarding. FT site is C/D/I, no erythema Musculoskeletal: Normal range of motion. He exhibits no edema.  Neurological: He is alert and oriented to person, place, and time. He has normal reflexes. No cranial nerve deficit. Gait normal. Coordination normal.  Skin: Skin is warm and dry. No rash noted.  Psychiatric: Mood, memory, affect and judgment normal.  Nursing note and vitals reviewed.   LABORATORY DATA:  I have reviewed the data as listed Results for ALFONSO, CARDEN (MRN [id number]) as of 10/15/2015 17:05  Ref. Range 10/15/2015 11:24  Sodium Latest Ref Range: 135-145 mmol/L 131 (L)  Potassium Latest Ref Range: 3.5-5.1 mmol/L 4.1  Chloride Latest Ref Range: 101-111 mmol/L 97 (L)  CO2 Latest Ref Range: 22-32 mmol/L 29  BUN Latest Ref Range: 6-20 mg/dL 16  Creatinine Latest Ref Range: 0.61-1.24 mg/dL 0.67  Calcium Latest Ref Range: 8.9-10.3 mg/dL 8.2 (L)  EGFR  (Non-African Amer.) Latest Ref Range: >60 mL/min >60  EGFR (African American) Latest Ref Range: >60 mL/min >60  Glucose Latest Ref Range: 65-99 mg/dL 120 (H)  Anion gap Latest Ref Range: 5-15  5  Magnesium Latest Ref Range: 1.7-2.4 mg/dL 1.8  Alkaline Phosphatase Latest Ref Range: 38-126 U/L 34 (L)  Albumin Latest Ref Range: 3.5-5.0 g/dL 3.0 (L)  AST Latest Ref Range: 15-41 U/L 11 (L)  ALT Latest Ref Range: 17-63 U/L 10 (L)  Total Protein Latest Ref Range: 6.5-8.1 g/dL 6.2 (L)  Total Bilirubin Latest Ref Range: 0.3-1.2 mg/dL 0.4  WBC Latest Ref Range: 4.0-10.5 K/uL 3.1 (L)  RBC Latest Ref Range: 4.22-5.81 MIL/uL 2.78 (L)  Hemoglobin Latest Ref Range: 13.0-17.0 g/dL 8.5 (L)  HCT Latest Ref Range: 39.0-52.0 % 25.6 (L)  MCV Latest Ref Range: 78.0-100.0 fL 92.1  MCH Latest Ref Range: 26.0-34.0 pg 30.6  MCHC Latest Ref Range: 30.0-36.0 g/dL 33.2  RDW Latest Ref Range: 11.5-15.5 % 17.7 (H)  Platelets Latest Ref Range: 150-400 K/uL 147 (L)  Neutrophils Latest Units: % 60  Lymphocytes Latest Units: % 20  Monocytes Relative Latest Units: % 20  Eosinophil Latest Units: % 0  Basophil Latest Units: % 0  NEUT# Latest Ref Range: 1.7-7.7 K/uL 1.9  Lymphocyte # Latest Ref Range: 0.7-4.0 K/uL 0.6 (L)  Monocyte # Latest Ref Range: 0.1-1.0 K/uL 0.6  Eosinophils Absolute Latest Ref Range: 0.0-0.7 K/uL 0.0  Basophils Absolute Latest Ref Range: 0.0-0.1 K/uL 0.0  RBC Morphology Unknown POLYCHROMASIA PRE...  WBC Morphology Unknown WHITE COUNT CONFI...  Smear Review Unknown LARGE PLATELETS P...    RADIOGRAPHIC STUDIES: I have personally reviewed the radiological reports as listed.  Study Result     CLINICAL DATA: Initial treatment strategy for primary tonsillar squamous cell carcinoma. Left-sided lung tonsillectomy.  EXAM: NUCLEAR MEDICINE PET SKULL BASE TO THIGH  TECHNIQUE: 8.8 mCi F-18 FDG was injected intravenously. Full-ring PET imaging was performed from the skull  base to thigh after  the radiotracer. CT data was obtained and used for attenuation correction and anatomic localization.  FASTING BLOOD GLUCOSE: Value: 116 mg/dl  COMPARISON: Clinic note of 07/24/2015. Neck and chest CTs of 06/25/2015.  FINDINGS: NECK  No tonsilar hypermetabolism.  Hypermetabolic left-sided level 2 nodes. Index node measures 2.1 cm and a S.U.V. max of 17.9 on image 25/series 4. 1.9 cm on 06/25/2015.  Immediately inferior node measures 9 mm and a S.U.V. max of 3.8 on image 30/series 4.  CHEST  No areas of abnormal hypermetabolism.  ABDOMEN/PELVIS  No areas of abnormal hypermetabolism.  SKELETON  No abnormal marrow activity.  CT IMAGES PERFORMED FOR ATTENUATION CORRECTION  carotid atherosclerosis. Mild cardiomegaly with multivessel coronary artery atherosclerosis. Mild centrilobular emphysema. Mild bibasilar atelectasis. Normal adrenal glands. Gallstones and a contracted gallbladder. Moderate cirrhosis. Advanced abdominal aortic atherosclerosis. Scattered colonic diverticula. Mild prostatomegaly. Fat containing left inguinal hernia. Prior right inguinal hernia repair.  IMPRESSION: 1. Hypermetabolic left-sided level 2 adenopathy, consistent with nodal metastasis. 2. No extracervical metastatic disease identified. 3. Cirrhosis. 4. Atherosclerosis, including within the coronary arteries. 5. Cholelithiasis.   Electronically Signed  By: Abigail Miyamoto M.D.  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 He is seen at [facility], Hepatitis C negative CT neck with contrast 06/25/2015 at [place] level 2 malignant LAD, largest node is 3.5 cm long axis. R worse than L carotid atherosclerosis and stenosis. CT chest with no acute or significant findings within the chest [facility] stay secondary to  intractable n/v and mucositis Oropharyngeal candidiasis/esophageal candidiasis Odynophagia Fever  Oropharyngeal candidiasis is a common local infection seen in patients who have received antibiotics, chemotherapy, or radiation therapy to the head and neck. The usual causative agent is Candida albicans. In addition, he has odynophagia. The hallmark of esophageal candidiasis is odynophagia. The diagnosis of Candida esophagitis is usually made through endoscopy with a biopsy; HOWEVER, An alternative diagnostic approach is to treat with systemic antifungal agents on the basis of the patient's history. Odynophagia due to candidiasis should improve within several days. Endoscopy can be pursued if empiric therapy does not lead to symptom resolution. Given his fever and symptoms and inability to swallow pills at this point we can start with IV therapy, if he continues to have fever, we will admit for further evaluation. If improved we will convert to diflucan orally or via FT by the weekend.  All questions were answered. The patient knows to call the clinic with any problems, questions or concerns.  This document serves as a record of services personally performed by Ancil Linsey, MD. It was created on her behalf by Arlyce Harman, a trained medical scribe. The creation of this record is based on the scribe's personal observations and the provider's statements to them. This document has been checked and approved by the attending provider.  I have reviewed the above documentation for accuracy and completeness, and I agree with the above.  This note was electronically signed.    Molli Hazard, MD  10/15/2015 5:01 PM

## 2015-10-15 NOTE — Progress Notes (Signed)
1300:  Pt requesting "decadron boost" and something for pain.  Dr. Whitney Muse notified.  Order received for morphine 4mg  IV.   Tolerated infusion w/o adverse reaction. Discharged ambulatory in c/o sister for transport home.

## 2015-10-15 NOTE — Patient Instructions (Signed)
[place] at [facility] Discharge Instructions  RECOMMENDATIONS MADE BY THE CONSULTANT AND ANY TEST RESULTS WILL BE SENT TO YOUR REFERRING PHYSICIAN.  Exam and discussion today with Dr. Whitney Muse.   Thank you for choosing [place] at [facility] to provide your oncology and hematology care.  To afford each patient quality time with our provider, please arrive at least 15 minutes before your scheduled appointment time.   Beginning January 23rd 2017 lab work for the [facility] will be done in the  Main lab at [facility] on 1st floor. If you have a lab appointment with the [place] please come in thru the  Main Entrance and check in at the main information desk  You need to re-schedule your appointment should you arrive 10 or more minutes late.  We strive to give you quality time with our providers, and arriving late affects you and other patients whose appointments are after yours.  Also, if you no show three or more times for appointments you may be dismissed from the clinic at the providers discretion.     Again, thank you for choosing [facility].  Our hope is that these requests will decrease the amount of time that you wait before being seen by our physicians.       _____________________________________________________________  Should you have questions after your visit to [facility], please contact our office at [phone] between the hours of 8:30 a.m. and 4:30 p.m.  Voicemails left after 4:30 p.m. will not be returned until the following business day.  For prescription refill requests, have your pharmacy contact our office.         Resources For Cancer Patients and their Caregivers ? American Cancer Society: Can assist with transportation, wigs, general needs, runs Look Good Feel Better.        (757) 772-2171 ? Cancer Care: Provides financial assistance, online support groups, medication/co-pay  assistance.  1-800-813-HOPE (864)236-6356) ? [place] Assists [place] cancer patients and their families through emotional , educational and financial support.  (470) 775-7375 ? [place] DSS Where to apply for food stamps, Medicaid and utility assistance. (415)828-4494 ? [employer]: Transportation to medical appointments. 534-483-5354 ? Social Security Administration: May apply for disability if have a Stage IV cancer. (347)636-0486 (630) 469-1648 ? [employer], Disability and Transit Services: Assists with nutrition, care and transit needs. [place] Support Programs: @10RELATIVEDAYS @ > Cancer Support Group  2nd Tuesday of the month 1pm-2pm, Journey Room  > Creative Journey  3rd Tuesday of the month 1130am-1pm, Journey Room  > Look Good Feel Better  1st Wednesday of the month 10am-12 noon, Journey Room (Call [place] to register [phone])

## 2015-10-16 ENCOUNTER — Ambulatory Visit (HOSPITAL_COMMUNITY): Payer: Self-pay

## 2015-10-16 ENCOUNTER — Encounter (HOSPITAL_BASED_OUTPATIENT_CLINIC_OR_DEPARTMENT_OTHER): Payer: BLUE CROSS/BLUE SHIELD

## 2015-10-16 ENCOUNTER — Telehealth (HOSPITAL_COMMUNITY): Payer: Self-pay

## 2015-10-16 ENCOUNTER — Encounter (HOSPITAL_COMMUNITY): Payer: Self-pay

## 2015-10-16 ENCOUNTER — Ambulatory Visit (HOSPITAL_COMMUNITY): Payer: Self-pay | Admitting: Speech Pathology

## 2015-10-16 DIAGNOSIS — R509 Fever, unspecified: Secondary | ICD-10-CM | POA: Diagnosis not present

## 2015-10-16 DIAGNOSIS — C090 Malignant neoplasm of tonsillar fossa: Secondary | ICD-10-CM | POA: Diagnosis not present

## 2015-10-16 DIAGNOSIS — B3781 Candidal esophagitis: Secondary | ICD-10-CM

## 2015-10-16 DIAGNOSIS — R1310 Dysphagia, unspecified: Secondary | ICD-10-CM | POA: Diagnosis not present

## 2015-10-16 MED ORDER — SODIUM CHLORIDE 0.9% FLUSH
10.0000 mL | INTRAVENOUS | Status: DC | PRN
  Administered 2015-10-16: 10 mL via INTRAVENOUS
  Filled 2015-10-16: qty 10

## 2015-10-16 MED ORDER — SODIUM CHLORIDE 0.9 % IV SOLN
INTRAVENOUS | Status: DC
  Administered 2015-10-16: 14:00:00 via INTRAVENOUS

## 2015-10-16 MED ORDER — FLUCONAZOLE IN SODIUM CHLORIDE 200-0.9 MG/100ML-% IV SOLN
200.0000 mg | Freq: Once | INTRAVENOUS | Status: AC
  Administered 2015-10-16: 200 mg via INTRAVENOUS
  Filled 2015-10-16: qty 100

## 2015-10-16 MED ORDER — HEPARIN SOD (PORK) LOCK FLUSH 100 UNIT/ML IV SOLN
500.0000 [IU] | Freq: Once | INTRAVENOUS | Status: AC
  Administered 2015-10-16: 500 [IU] via INTRAVENOUS

## 2015-10-16 NOTE — Patient Instructions (Signed)
[place] at [facility] Discharge Instructions  RECOMMENDATIONS MADE BY THE CONSULTANT AND ANY TEST RESULTS WILL BE SENT TO YOUR REFERRING PHYSICIAN.  IV fluids and IV Diflucan today. Return as scheduled tomorrow for IV Diflucan.   Thank you for choosing [place] at [facility] to provide your oncology and hematology care.  To afford each patient quality time with our provider, please arrive at least 15 minutes before your scheduled appointment time.   Beginning January 23rd 2017 lab work for the [facility] will be done in the  Main lab at [facility] on 1st floor. If you have a lab appointment with the [place] please come in thru the  Main Entrance and check in at the main information desk  You need to re-schedule your appointment should you arrive 10 or more minutes late.  We strive to give you quality time with our providers, and arriving late affects you and other patients whose appointments are after yours.  Also, if you no show three or more times for appointments you may be dismissed from the clinic at the providers discretion.     Again, thank you for choosing [facility].  Our hope is that these requests will decrease the amount of time that you wait before being seen by our physicians.       _____________________________________________________________  Should you have questions after your visit to [facility] please contact our office at [phone] between the hours of 8:30 a.m. and 4:30 p.m.  Voicemails left after 4:30 p.m. will not be returned until the following business day.  For prescription refill requests, have your pharmacy contact our office.         Resources For Cancer Patients and their Caregivers ? American Cancer Society: Can assist with transportation, wigs, general needs, runs Look Good Feel Better.        (912) 745-3558 ? Cancer Care: Provides financial assistance,  online support groups, medication/co-pay assistance.  1-800-813-HOPE (480)483-1105) ? [place] Assists [place] cancer patients and their families through emotional , educational and financial support.  781-391-7543 ? [place] DSS Where to apply for food stamps, Medicaid and utility assistance. 7138675773 ? [employer]: Transportation to medical appointments. 786-803-6669 ? Social Security Administration: May apply for disability if have a Stage IV cancer. (787)602-9614 (406)523-0062 ? [employer], Disability and Transit Services: Assists with nutrition, care and transit needs. [place] Support Programs: @10RELATIVEDAYS @ > Cancer Support Group  2nd Tuesday of the month 1pm-2pm, Journey Room  > Creative Journey  3rd Tuesday of the month 1130am-1pm, Journey Room  > Look Good Feel Better  1st Wednesday of the month 10am-12 noon, Journey Room (Call [place] to register [phone])

## 2015-10-16 NOTE — Telephone Encounter (Signed)
10/16/15 sister called to cx she said that he wasn't doing well enough to take therapy today and she would be in touch

## 2015-10-17 ENCOUNTER — Inpatient Hospital Stay (HOSPITAL_COMMUNITY)
Admission: AD | Admit: 2015-10-17 | Discharge: 2015-10-20 | DRG: 157 | Disposition: A | Discharge status: Home / Self Care | Payer: BLUE CROSS/BLUE SHIELD | Source: Ambulatory Visit | Attending: Internal Medicine | Admitting: Internal Medicine

## 2015-10-17 ENCOUNTER — Encounter (HOSPITAL_COMMUNITY): Payer: Self-pay | Admitting: General Practice

## 2015-10-17 ENCOUNTER — Inpatient Hospital Stay (HOSPITAL_COMMUNITY): Payer: BLUE CROSS/BLUE SHIELD

## 2015-10-17 ENCOUNTER — Encounter (HOSPITAL_BASED_OUTPATIENT_CLINIC_OR_DEPARTMENT_OTHER): Payer: BLUE CROSS/BLUE SHIELD | Admitting: Hematology & Oncology

## 2015-10-17 ENCOUNTER — Encounter (HOSPITAL_BASED_OUTPATIENT_CLINIC_OR_DEPARTMENT_OTHER): Payer: BLUE CROSS/BLUE SHIELD

## 2015-10-17 ENCOUNTER — Encounter (HOSPITAL_COMMUNITY): Payer: Self-pay | Admitting: Hematology & Oncology

## 2015-10-17 ENCOUNTER — Encounter (HOSPITAL_COMMUNITY): Payer: Self-pay

## 2015-10-17 DIAGNOSIS — B3781 Candidal esophagitis: Secondary | ICD-10-CM

## 2015-10-17 DIAGNOSIS — B370 Candidal stomatitis: Secondary | ICD-10-CM

## 2015-10-17 DIAGNOSIS — C099 Malignant neoplasm of tonsil, unspecified: Secondary | ICD-10-CM | POA: Diagnosis present

## 2015-10-17 DIAGNOSIS — Z923 Personal history of irradiation: Secondary | ICD-10-CM | POA: Diagnosis not present

## 2015-10-17 DIAGNOSIS — Z811 Family history of alcohol abuse and dependence: Secondary | ICD-10-CM

## 2015-10-17 DIAGNOSIS — K7030 Alcoholic cirrhosis of liver without ascites: Secondary | ICD-10-CM | POA: Diagnosis present

## 2015-10-17 DIAGNOSIS — C090 Malignant neoplasm of tonsillar fossa: Secondary | ICD-10-CM

## 2015-10-17 DIAGNOSIS — R509 Fever, unspecified: Secondary | ICD-10-CM

## 2015-10-17 DIAGNOSIS — D709 Neutropenia, unspecified: Secondary | ICD-10-CM | POA: Diagnosis present

## 2015-10-17 DIAGNOSIS — Z931 Gastrostomy status: Secondary | ICD-10-CM

## 2015-10-17 DIAGNOSIS — R1319 Other dysphagia: Secondary | ICD-10-CM | POA: Diagnosis present

## 2015-10-17 DIAGNOSIS — I10 Essential (primary) hypertension: Secondary | ICD-10-CM | POA: Diagnosis present

## 2015-10-17 DIAGNOSIS — Z9221 Personal history of antineoplastic chemotherapy: Secondary | ICD-10-CM | POA: Diagnosis not present

## 2015-10-17 DIAGNOSIS — D61810 Antineoplastic chemotherapy induced pancytopenia: Secondary | ICD-10-CM | POA: Diagnosis present

## 2015-10-17 DIAGNOSIS — R1310 Dysphagia, unspecified: Secondary | ICD-10-CM

## 2015-10-17 DIAGNOSIS — K7460 Unspecified cirrhosis of liver: Secondary | ICD-10-CM | POA: Diagnosis present

## 2015-10-17 DIAGNOSIS — D6481 Anemia due to antineoplastic chemotherapy: Secondary | ICD-10-CM | POA: Diagnosis not present

## 2015-10-17 DIAGNOSIS — B029 Zoster without complications: Secondary | ICD-10-CM | POA: Diagnosis present

## 2015-10-17 DIAGNOSIS — E440 Moderate protein-calorie malnutrition: Secondary | ICD-10-CM | POA: Insufficient documentation

## 2015-10-17 DIAGNOSIS — F17210 Nicotine dependence, cigarettes, uncomplicated: Secondary | ICD-10-CM | POA: Diagnosis present

## 2015-10-17 DIAGNOSIS — R5081 Fever presenting with conditions classified elsewhere: Secondary | ICD-10-CM | POA: Diagnosis present

## 2015-10-17 DIAGNOSIS — K766 Portal hypertension: Secondary | ICD-10-CM | POA: Diagnosis present

## 2015-10-17 DIAGNOSIS — E861 Hypovolemia: Secondary | ICD-10-CM | POA: Diagnosis present

## 2015-10-17 DIAGNOSIS — E871 Hypo-osmolality and hyponatremia: Secondary | ICD-10-CM | POA: Diagnosis present

## 2015-10-17 DIAGNOSIS — T451X5A Adverse effect of antineoplastic and immunosuppressive drugs, initial encounter: Secondary | ICD-10-CM | POA: Diagnosis present

## 2015-10-17 DIAGNOSIS — Z6824 Body mass index (BMI) 24.0-24.9, adult: Secondary | ICD-10-CM

## 2015-10-17 LAB — COMPREHENSIVE METABOLIC PANEL
ALBUMIN: 2.7 g/dL — AB (ref 3.5–5.0)
ALT: 11 U/L — AB (ref 17–63)
AST: 11 U/L — AB (ref 15–41)
Alkaline Phosphatase: 32 U/L — ABNORMAL LOW (ref 38–126)
Anion gap: 7 (ref 5–15)
BUN: 11 mg/dL (ref 6–20)
CHLORIDE: 97 mmol/L — AB (ref 101–111)
CO2: 25 mmol/L (ref 22–32)
CREATININE: 0.54 mg/dL — AB (ref 0.61–1.24)
Calcium: 8 mg/dL — ABNORMAL LOW (ref 8.9–10.3)
GFR calc Af Amer: >60 mL/min (ref >60)
GFR calc non Af Amer: >60 mL/min (ref >60)
Glucose, Bld: 129 mg/dL — ABNORMAL HIGH (ref 65–99)
POTASSIUM: 3.8 mmol/L (ref 3.5–5.1)
SODIUM: 129 mmol/L — AB (ref 135–145)
Total Bilirubin: 0.4 mg/dL (ref 0.3–1.2)
Total Protein: 6 g/dL — ABNORMAL LOW (ref 6.5–8.1)

## 2015-10-17 LAB — CBC
HEMATOCRIT: 22.9 % — AB (ref 39.0–52.0)
HEMOGLOBIN: 7.7 g/dL — AB (ref 13.0–17.0)
MCH: 30.8 pg (ref 26.0–34.0)
MCHC: 33.6 g/dL (ref 30.0–36.0)
MCV: 91.6 fL (ref 78.0–100.0)
Platelets: 114 10*3/uL — ABNORMAL LOW (ref 150–400)
RBC: 2.5 MIL/uL — ABNORMAL LOW (ref 4.22–5.81)
RDW: 17.8 % — AB (ref 11.5–15.5)
WBC: 4 10*3/uL (ref 4.0–10.5)

## 2015-10-17 MED ORDER — SODIUM CHLORIDE 0.9 % IV SOLN: INTRAVENOUS | Status: DC

## 2015-10-17 MED ORDER — LIDOCAINE VISCOUS 2 % MT SOLN
20.0000 mL | OROMUCOSAL | Status: DC | PRN
  Administered 2015-10-17: 20 mL via OROMUCOSAL
  Filled 2015-10-17: qty 30

## 2015-10-17 MED ORDER — ACETAMINOPHEN 325 MG PO TABS: 650.0000 mg | ORAL_TABLET | Freq: Four times a day (QID) | ORAL | Status: DC | PRN

## 2015-10-17 MED ORDER — SODIUM FLUORIDE 1.1 % DT GEL: Freq: Every day | DENTAL | Status: DC

## 2015-10-17 MED ORDER — ENOXAPARIN SODIUM 40 MG/0.4ML ~~LOC~~ SOLN
40.0000 mg | SUBCUTANEOUS | Status: DC
  Administered 2015-10-17 – 2015-10-19 (×3): 40 mg via SUBCUTANEOUS
  Filled 2015-10-17 (×3): qty 0.4

## 2015-10-17 MED ORDER — LISINOPRIL-HYDROCHLOROTHIAZIDE 20-25 MG PO TABS: 1.0000 | ORAL_TABLET | Freq: Every day | ORAL | Status: DC

## 2015-10-17 MED ORDER — PANTOPRAZOLE SODIUM 40 MG IV SOLR
40.0000 mg | INTRAVENOUS | Status: DC
  Administered 2015-10-17: 40 mg via INTRAVENOUS
  Filled 2015-10-17: qty 40

## 2015-10-17 MED ORDER — ONDANSETRON HCL 4 MG/2ML IJ SOLN
4.0000 mg | Freq: Four times a day (QID) | INTRAMUSCULAR | Status: DC | PRN
  Administered 2015-10-19 – 2015-10-20 (×3): 4 mg via INTRAVENOUS
  Filled 2015-10-17 (×4): qty 2

## 2015-10-17 MED ORDER — VANCOMYCIN HCL IN DEXTROSE 750-5 MG/150ML-% IV SOLN
750.0000 mg | Freq: Three times a day (TID) | INTRAVENOUS | Status: DC
  Administered 2015-10-18 – 2015-10-19 (×5): 750 mg via INTRAVENOUS
  Filled 2015-10-17 (×11): qty 150

## 2015-10-17 MED ORDER — LORAZEPAM 0.5 MG PO TABS
0.5000 mg | ORAL_TABLET | ORAL | Status: DC
  Administered 2015-10-17: 0.5 mg via ORAL
  Filled 2015-10-17: qty 1

## 2015-10-17 MED ORDER — FENTANYL 50 MCG/HR TD PT72
50.0000 ug | MEDICATED_PATCH | TRANSDERMAL | Status: DC
  Administered 2015-10-18: 50 ug via TRANSDERMAL
  Filled 2015-10-17: qty 1

## 2015-10-17 MED ORDER — ACETAMINOPHEN 650 MG RE SUPP: 650.0000 mg | Freq: Four times a day (QID) | RECTAL | Status: DC | PRN

## 2015-10-17 MED ORDER — ONDANSETRON 4 MG PO TBDP: 8.0000 mg | ORAL_TABLET | Freq: Three times a day (TID) | ORAL | Status: DC | PRN

## 2015-10-17 MED ORDER — ONDANSETRON HCL 4 MG PO TABS
4.0000 mg | ORAL_TABLET | Freq: Four times a day (QID) | ORAL | Status: DC | PRN
  Administered 2015-10-17: 4 mg via ORAL
  Filled 2015-10-17: qty 1

## 2015-10-17 MED ORDER — VITAMIN D 1000 UNITS PO TABS
1000.0000 [IU] | ORAL_TABLET | Freq: Every day | ORAL | Status: DC
  Administered 2015-10-18 – 2015-10-20 (×3): 1000 [IU] via ORAL
  Filled 2015-10-17 (×3): qty 1

## 2015-10-17 MED ORDER — LISINOPRIL 10 MG PO TABS
20.0000 mg | ORAL_TABLET | Freq: Every day | ORAL | Status: DC
  Administered 2015-10-18 – 2015-10-20 (×3): 20 mg via ORAL
  Filled 2015-10-17 (×3): qty 2

## 2015-10-17 MED ORDER — JEVITY 1.2 CAL PO LIQD
474.0000 mL | Freq: Four times a day (QID) | ORAL | Status: DC
  Administered 2015-10-17 – 2015-10-20 (×10): 474 mL
  Filled 2015-10-17 (×16): qty 474

## 2015-10-17 MED ORDER — NICOTINE 7 MG/24HR TD PT24
7.0000 mg | MEDICATED_PATCH | Freq: Every day | TRANSDERMAL | Status: DC
  Administered 2015-10-18 – 2015-10-20 (×3): 7 mg via TRANSDERMAL
  Filled 2015-10-17 (×3): qty 1

## 2015-10-17 MED ORDER — FLUCONAZOLE IN SODIUM CHLORIDE 200-0.9 MG/100ML-% IV SOLN
200.0000 mg | Freq: Once | INTRAVENOUS | Status: AC
  Administered 2015-10-17: 200 mg via INTRAVENOUS
  Filled 2015-10-17: qty 100

## 2015-10-17 MED ORDER — DEXTROSE 5 % IV SOLN
2.0000 g | Freq: Three times a day (TID) | INTRAVENOUS | Status: DC
  Administered 2015-10-17 – 2015-10-19 (×5): 2 g via INTRAVENOUS
  Filled 2015-10-17 (×12): qty 2

## 2015-10-17 MED ORDER — SODIUM CHLORIDE 0.9 % IV SOLN
INTRAVENOUS | Status: DC
  Administered 2015-10-17: 19:00:00 via INTRAVENOUS

## 2015-10-17 MED ORDER — METOCLOPRAMIDE HCL 5 MG/ML IJ SOLN
10.0000 mg | Freq: Four times a day (QID) | INTRAMUSCULAR | Status: DC | PRN
  Administered 2015-10-17 – 2015-10-19 (×2): 10 mg via INTRAVENOUS
  Filled 2015-10-17 (×2): qty 2

## 2015-10-17 MED ORDER — FENTANYL 50 MCG/HR TD PT72: 50.0000 ug | MEDICATED_PATCH | TRANSDERMAL | Status: DC

## 2015-10-17 MED ORDER — FLUCONAZOLE IN SODIUM CHLORIDE 200-0.9 MG/100ML-% IV SOLN
200.0000 mg | INTRAVENOUS | Status: DC
  Administered 2015-10-18 – 2015-10-20 (×3): 200 mg via INTRAVENOUS
  Filled 2015-10-17 (×5): qty 100

## 2015-10-17 MED ORDER — VANCOMYCIN HCL 10 G IV SOLR
1500.0000 mg | Freq: Once | INTRAVENOUS | Status: AC
  Administered 2015-10-17: 1500 mg via INTRAVENOUS
  Filled 2015-10-17: qty 1500

## 2015-10-17 MED ORDER — LORATADINE 10 MG PO TABS
10.0000 mg | ORAL_TABLET | Freq: Every day | ORAL | Status: DC
  Administered 2015-10-17 – 2015-10-20 (×4): 10 mg via ORAL
  Filled 2015-10-17 (×4): qty 1

## 2015-10-17 MED ORDER — HYDROCHLOROTHIAZIDE 25 MG PO TABS: 25.0000 mg | ORAL_TABLET | Freq: Every day | ORAL | Status: DC

## 2015-10-17 MED ORDER — DEXTROSE 5 % IV SOLN
INTRAVENOUS | Status: AC
  Filled 2015-10-17 (×2): qty 2

## 2015-10-17 MED ORDER — PROCHLORPERAZINE MALEATE 5 MG PO TABS
10.0000 mg | ORAL_TABLET | Freq: Four times a day (QID) | ORAL | Status: DC | PRN
Start: 2015-10-17 — End: 2015-10-20
  Administered 2015-10-19 – 2015-10-20 (×2): 10 mg via ORAL
  Filled 2015-10-17 (×3): qty 2

## 2015-10-17 MED ORDER — DEXTROSE 5 % IV SOLN
10.0000 mg/kg | Freq: Once | INTRAVENOUS | Status: AC
  Administered 2015-10-17: 745 mg via INTRAVENOUS
  Filled 2015-10-17: qty 14.9

## 2015-10-17 MED ORDER — OXYCODONE HCL 5 MG/5ML PO SOLN
5.0000 mg | ORAL | Status: DC | PRN
  Administered 2015-10-18: 10 mg via ORAL
  Administered 2015-10-18: 5 mg via ORAL
  Administered 2015-10-18 – 2015-10-19 (×3): 10 mg via ORAL
  Filled 2015-10-17 (×2): qty 10
  Filled 2015-10-17: qty 5
  Filled 2015-10-17 (×3): qty 10

## 2015-10-17 MED ORDER — LORAZEPAM 2 MG/ML IJ SOLN
0.5000 mg | INTRAMUSCULAR | Status: DC
  Administered 2015-10-17 – 2015-10-20 (×13): 0.5 mg via INTRAVENOUS
  Administered 2015-10-20: 2 mg via INTRAVENOUS
  Administered 2015-10-20: 0.5 mg via INTRAVENOUS
  Filled 2015-10-17 (×14): qty 1

## 2015-10-17 MED ORDER — DEXTROSE 5 % IV SOLN
10.0000 mg/kg | Freq: Three times a day (TID) | INTRAVENOUS | Status: DC
  Administered 2015-10-17 – 2015-10-20 (×8): 775 mg via INTRAVENOUS
  Filled 2015-10-17 (×12): qty 15.5

## 2015-10-17 MED ORDER — SODIUM CHLORIDE 0.9 % IV SOLN
INTRAVENOUS | Status: DC
  Administered 2015-10-17: 10:00:00 via INTRAVENOUS

## 2015-10-17 NOTE — Progress Notes (Signed)
Patient c/o nausea this evening. Given zofran as ordered PRN nausea, pt stated that is what he takes at home. Pt called nursing about 1945 c/o nausea/vomiting. Vomiting small amount of clear to yellowish emesis. Text-paged Dr. Clementeen Graham to see if we could have anything IV ordered for nausea/vomiting. Night shift RN aware and will monitor. Donavan Foil, RN

## 2015-10-17 NOTE — Progress Notes (Addendum)
CONSULT NOTE - INITIAL  Pharmacy Consult for Acyclovir, Cefepime, Vancomycin Indication: Shingles, FUO, immunocompromised   Allergies  Allergen Reactions  . Bee Venom Swelling    Patient Measurements:   Adjusted Body Weight:   Vital Signs: Temp: 99 F (37.2 C) (05/25 0950) Temp Source: Oral (05/25 0950) BP: 107/66 mmHg (05/25 0950) Pulse Rate: 88 (05/25 0950) Intake/Output from previous day:   Intake/Output from this shift:    Labs:  Recent Labs  10/15/15 1124  WBC 3.1*  HGB 8.5*  PLT 147*  CREATININE 0.67   Estimated Creatinine Clearance: 98.9 mL/min (by C-G formula based on Cr of 0.67). No results for input(s): VANCOTROUGH, VANCOPEAK, VANCORANDOM, GENTTROUGH, GENTPEAK, GENTRANDOM, TOBRATROUGH, TOBRAPEAK, TOBRARND, AMIKACINPEAK, AMIKACINTROU, AMIKACIN in the last 72 hours.   Microbiology: No results found for this or any previous visit (from the past 720 hour(s)).  Medical History: Past Medical History  Diagnosis Date  . Tonsillar mass     left  . Portal hypertension ([place])   . Hypertension   . Cirrhosis ([place])     Medications:  Scheduled:  . 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 Shingles noted on R lateral neck, R shoulder and arm. Acyclovir IV started in Clinic Vancomycin & Cefepime added for FUO, immunocompromised patient   Goal of Therapy:  Eradicate shingles, FUO Vancomycin trough 15- 20  Plan:  Acyclovir 10 mg/kg (775 mg)  IV  every 8 hours Vancomycin 1500 mg IV loading dose, then 750 mg IV every 8 hours Cefepime 2 GM IV every 8 hours Vancomycin trough at steady state Labs per protocol  Abner Greenspan, Bartolo Montanye Bennett 10/17/2015,3:41 PM

## 2015-10-17 NOTE — Progress Notes (Signed)
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 He is seen at [facility], Hepatitis C negative CT neck with contrast 06/25/2015 at [place] level 2 malignant LAD, largest node is 3.5 cm long axis. R worse than L carotid atherosclerosis and stenosis. CT chest with no acute or significant findings within the chest    Cancer of tonsillar fossa ([place])   08/02/2015 Initial Diagnosis Cancer of tonsillar fossa ([place])   08/20/2015 Procedure Port-a-cath placed by IR   08/20/2015 Procedure G-tube placed by IR   08/21/2015 - 10/09/2015 Radiation Therapy 70 Gy, Dr. Isidore Moos.   08/21/2015 - 09/10/2015 Chemotherapy Cisplatin every 21 days with XRT.   08/28/2015 Adverse Reaction Tinnitis, Cisplatin-induced.   09/11/2015 - 09/30/2015 Chemotherapy Carboplatin/5FU   09/20/2015 - 09/21/2015 Hospital Admission Intractable nausea/vomiting. Severe Mucositis    HISTORY OF PRESENTING ILLNESS:  Frank Castillo 65 y.o. male is here for further follow-up of Stage IVA T2N2bM0 squamous cell Ca of the L tonsil. Left tonsillectomy performed on 07/09/2015 with Dr. Benjamine Mola.  Mr. Haik is accompanied by his sister. He has completed therapy. Was seen in clinic two days ago secondary to fever, NOT neutropenic. Significant oral thrush with worsening odynophagia. Patient was started on IV diflucan and followed closely in the outpatient setting. Today he represents still with significant fever and "new rash."  Describes rash as itching on neck and arms are itchy as well.   No chills, no nausea or vomiting. No headaches or blurry vision.   MEDICAL HISTORY:  Past Medical History  Diagnosis Date  . Tonsillar mass       left  . Portal hypertension ([place])   . Hypertension   . Cirrhosis ([place])     SURGICAL HISTORY: Past Surgical History  Procedure Laterality Date  . Hernia repair Right   . Mouth surgery    . Tonsillectomy Left 07/09/2015    Procedure: LEFT TONSILLECTOMY;  Surgeon: Leta Baptist, MD;  Location: [facility];  Service: ENT;  Laterality: Left;    SOCIAL HISTORY: Social History   Social History  . Marital Status: Single    Spouse Name: N/A  . Number of Children: 0  . Years of Education: N/A   Occupational History  . Not on file.   Social History Main Topics  . Smoking status: Current Every Day Smoker -- 0.50 packs/day    Types: Cigarettes  . Smokeless tobacco: Never Used  . Alcohol Use: No     Comment: former abuse- quit 09/2011  . Drug Use: No  . Sexual Activity: Not on file   Other Topics Concern  . Not on file   Social History Narrative   Divorced.   No children.   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   Currently smoking approximately one pack per day.   Has been smoking off/on since age 43.   Divorced No children. Just step-children. 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 Previously worked in [employer]. Originally from [place], [place]. Current smoker at 1 ppd. Began smoking at [age] yo, regularly smoking at 68-21 yo. During his "cocaine years" he smoked about 2 ppd. ETOH, recovering alcoholic. 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 He enjoys reading. He is his  mother's caregiver.  FAMILY HISTORY: Family History  Problem Relation Age of Onset  . Macular degeneration Mother   . Alcoholism Father    indicated that his mother is alive. He indicated that his father is deceased.   Mother still living at 21 years-old, with macular degeneration. She has no other health issues. Father deceased. Parents divorced at 25 years-old, so he is not sure what he died from. Father was an alcoholic. 4 sisters. 1 sister died of  suicide. 1 sister was adopted. 1 Sister lives in [place]. 1 Sister lives in [place].  ALLERGIES:  is allergic to bee venom.  MEDICATIONS:  Current Outpatient Prescriptions  Medication Sig Dispense Refill  . CARBOPLATIN IV Inject into the vein. Once every 21 days x 3 cycles    . cetirizine (ZYRTEC) 10 MG tablet Take 10 mg by mouth daily.    . cholecalciferol (VITAMIN D) 1000 units tablet Take 1,000 Units by mouth daily.    . clindamycin (CLINDAGEL) 1 % gel Apply topically 2 (two) times daily. 60 g 0  . Diphenhyd-Hydrocort-Nystatin (FIRST-DUKES MOUTHWASH) SUSP Use as directed 5 mLs in the mouth or throat 4 (four) times daily -  before meals and at bedtime. 240 mL 1  . fentaNYL (DURAGESIC - DOSED MCG/HR) 50 MCG/HR Place 1 patch (50 mcg total) onto the skin every 3 (three) days. 10 patch 0  . guaiFENesin (ROBITUSSIN) 100 MG/5ML SOLN Take 5 mLs (100 mg total) by mouth every 6 (six) hours as needed for cough or to loosen phlegm. 240 mL 1  . lidocaine (XYLOCAINE) 2 % solution Use as directed 20 mLs in the mouth or throat every 4 (four) hours as needed for mouth pain.   4  . lidocaine-prilocaine (EMLA) cream Apply a quarter size amount to port site 1 hour prior to chemo. Do not rub in. Cover with plastic wrap. 30 g 3  . lisinopril-hydrochlorothiazide (PRINZIDE,ZESTORETIC) 20-25 MG tablet Take 1 tablet by mouth daily. Reported on 09/25/2015  11  . LORazepam (ATIVAN) 0.5 MG tablet Take 1 tablet (0.5 mg total) by mouth every 4 (four) hours. 30 tablet 1  . Melatonin 10 MG TABS Take 1 tablet by mouth daily as needed (sleep). Reported on 09/25/2015    . metroNIDAZOLE (FLAGYL) 500 MG tablet TAKE 1 TABLET BY MOUTH THREE TIMES DAILY 30 tablet 0  . mupirocin ointment (BACTROBAN) 2 % Place 1 application into the nose 2 (two) times daily. (Patient taking differently: Place 1 application into the nose 2 (two) times daily. Apply to rash on thighs and groin) 22 g 1  . nicotine (NICODERM CQ) 14 mg/24hr patch apply 21 mg  patch daily x6wk, then 14 mg patch daily x2wk, then 7 mg patch daily x2wk 14 patch 0  . nicotine (NICODERM CQ) 21 mg/24hr patch apply 21 mg patch daily x6wk, then 14 mg patch daily x2wk, then 7 mg patch daily x2wk (Patient not taking: Reported on 09/25/2015) 21 patch 1  . nicotine (NICODERM CQ) 7 mg/24hr patch apply 21 mg patch daily x6wk, then 14 mg patch daily x2wk, then 7 mg patch daily x2wk (Patient not taking: Reported on 09/25/2015) 14 patch 0  . Nutritional Supplements (JEVITY 1.2 CAL/FIBER) LIQD 8 cans/day.  4 feeding of 2 cans each. Flush with 100 mls before and after each feeding 1000 mL 6  . nystatin (MYCOSTATIN) 100000 UNIT/ML suspension Take 5 mLs (500,000 Units total) by mouth 4 (four) times daily. 120 mL 1  . ondansetron (ZOFRAN ODT) 8 MG  disintegrating tablet Take 1 tablet (8 mg total) by mouth every 8 (eight) hours as needed for nausea or vomiting. 30 tablet 2  . oxyCODONE (ROXICODONE) 5 MG/5ML solution Take 5-10 mLs (5-10 mg total) by mouth every 4 (four) hours as needed for severe pain. 250 mL 0  . pantoprazole (PROTONIX) 40 MG tablet Take 1 tablet (40 mg total) by mouth daily. 30 tablet 3  . prochlorperazine (COMPAZINE) 10 MG tablet Take 1 tablet (10 mg total) by mouth every 6 (six) hours as needed for nausea or vomiting. 30 tablet 2  . silver sulfADIAZINE (SILVADENE) 1 % cream   4  . sodium fluoride (FLUORISHIELD) 1.1 % GEL dental gel Instill one drop of gel per tooth space of fluoride tray. Place over teeth for 5 minutes. Remove. Spit out excess. Repeat nightly. 120 mL prn  . sucralfate (CARAFATE) 1 g tablet Reported on 09/25/2015  0  . traMADol (ULTRAM) 50 MG tablet Take 1 tablet (50 mg total) by mouth every 6 (six) hours as needed. (Patient not taking: Reported on 09/25/2015) 60 tablet 1   No current facility-administered medications for this visit.   Facility-Administered Medications Ordered in Other Visits  Medication Dose Route Frequency Provider Last Rate Last Dose  . 0.9 %   sodium chloride infusion   Intravenous Continuous Kelby Fam Penland, MD      . 0.9 %  sodium chloride infusion   Intravenous Continuous Patrici Ranks, MD 500 mL/hr at 10/17/15 1006    . fluconazole (DIFLUCAN) IVPB 200 mg  200 mg Intravenous Once Patrici Ranks, MD   200 mg at 10/17/15 1006    Review of Systems  Constitutional: Positive for malaise/fatigue. Negative for fever, chills.  HENT: Positive for mouth pain, and sore throat. Negative for congestion, hearing loss and nosebleeds.   Mouth pain, pain with swallowing which has worsening over past few days Eyes: Negative.  Negative for blurred vision, double vision, pain and discharge.  Respiratory: Positive for sputum production, less thick  Negative for cough, hemoptysis, shortness of breath and wheezing.   Has thick secretions. Continues to swish soda.  Cardiovascular: Negative.  Negative for chest pain, palpitations, claudication, leg swelling and PND.  Gastrointestinal: Positive for nausea,  improving  Negative for heartburn, vomiting, abdominal pain, blood in stool and melena.  Nausea  Genitourinary: Negative.  Negative for dysuria, urgency, frequency and hematuria.  Musculoskeletal: Negative.  Negative for myalgias, joint pain and falls.  Skin: Negative.  Negative for itching and rash.  Neurological: Negative.  Negative for dizziness, tingling, tremors, sensory change, speech change, focal weakness, seizures, loss of consciousness, weakness and headaches.  Endo/Heme/Allergies: Negative.  Does not bruise/bleed easily.  Psychiatric/Behavioral: Positive for insomnia.  Negative for depression, suicidal ideas, memory loss and substance abuse. The patient is not nervous/anxious. Trouble sleeping due to mouth pain,  mouth secretions. All other systems reviewed and are negative.  14 point ROS was done and is otherwise as detailed above or in HPI   PHYSICAL EXAMINATION: ECOG PERFORMANCE STATUS: 0 - Asymptomatic   Vitals - 1 value  per visit 3/71/6967  SYSTOLIC 893  DIASTOLIC 70  Pulse 85  Temperature 98.8  Respirations 20    Physical Exam  Constitutional: He is oriented to person, place, and time  and in no distress.  HENT:  Head: Normocephalic and atraumatic.  Nose: Nose normal.  Mouth/Throat: Mucositis noted. Sides of tongue quite "raw' erythema and plaque. Thrush improved Eyes: Conjunctivae and EOM are normal. Pupils are  equal, round, and reactive to light. Right eye exhibits no discharge. Left eye exhibits no discharge. No scleral icterus.  Neck: Normal range of motion. Neck supple. No tracheal deviation present. No thyromegaly present.  Significant bilateral XRT changes. Skin is well taken care of Cardiovascular: Normal rate, regular rhythm and normal heart sounds.  Exam reveals no gallop and no friction rub.  Port site is C/D/I no erythema or bruising No murmur heard.  Pulmonary/Chest: Effort normal and breath sounds normal. He has no wheezes. He has no rales.  Abdominal: Soft. Bowel sounds are normal. He exhibits no distension and no mass. There is no tenderness. There is no rebound and no guarding. FT site is C/D/I, no erythema Musculoskeletal: Normal range of motion. He exhibits no edema.  Neurological: He is alert and oriented to person, place, and time. He has normal reflexes. No cranial nerve deficit. Gait normal. Coordination normal.  Skin: Skin is warm and dry. Faint macular rash over face, cluster of vesicles on erythematous base R neck C5 dermatome c/w shinges. Erythematous patches down R arm ? Early shingles, one on L arm. None on back or chest or abdomen. Psychiatric: Mood, memory, affect and judgment normal.  Nursing note and vitals reviewed.    LABORATORY DATA:  I have reviewed the data as listed Results for RYELAN, KAZEE (MRN [id number]) as of 10/15/2015 17:05  Ref. Range 10/15/2015 11:24  Sodium Latest Ref Range: 135-145 mmol/L 131 (L)  Potassium Latest Ref Range: 3.5-5.1 mmol/L 4.1    Chloride Latest Ref Range: 101-111 mmol/L 97 (L)  CO2 Latest Ref Range: 22-32 mmol/L 29  BUN Latest Ref Range: 6-20 mg/dL 16  Creatinine Latest Ref Range: 0.61-1.24 mg/dL 0.67  Calcium Latest Ref Range: 8.9-10.3 mg/dL 8.2 (L)  EGFR (Non-African Amer.) Latest Ref Range: >60 mL/min >60  EGFR (African American) Latest Ref Range: >60 mL/min >60  Glucose Latest Ref Range: 65-99 mg/dL 120 (H)  Anion gap Latest Ref Range: 5-15  5  Magnesium Latest Ref Range: 1.7-2.4 mg/dL 1.8  Alkaline Phosphatase Latest Ref Range: 38-126 U/L 34 (L)  Albumin Latest Ref Range: 3.5-5.0 g/dL 3.0 (L)  AST Latest Ref Range: 15-41 U/L 11 (L)  ALT Latest Ref Range: 17-63 U/L 10 (L)  Total Protein Latest Ref Range: 6.5-8.1 g/dL 6.2 (L)  Total Bilirubin Latest Ref Range: 0.3-1.2 mg/dL 0.4  WBC Latest Ref Range: 4.0-10.5 K/uL 3.1 (L)  RBC Latest Ref Range: 4.22-5.81 MIL/uL 2.78 (L)  Hemoglobin Latest Ref Range: 13.0-17.0 g/dL 8.5 (L)  HCT Latest Ref Range: 39.0-52.0 % 25.6 (L)  MCV Latest Ref Range: 78.0-100.0 fL 92.1  MCH Latest Ref Range: 26.0-34.0 pg 30.6  MCHC Latest Ref Range: 30.0-36.0 g/dL 33.2  RDW Latest Ref Range: 11.5-15.5 % 17.7 (H)  Platelets Latest Ref Range: 150-400 K/uL 147 (L)  Neutrophils Latest Units: % 60  Lymphocytes Latest Units: % 20  Monocytes Relative Latest Units: % 20  Eosinophil Latest Units: % 0  Basophil Latest Units: % 0  NEUT# Latest Ref Range: 1.7-7.7 K/uL 1.9  Lymphocyte # Latest Ref Range: 0.7-4.0 K/uL 0.6 (L)  Monocyte # Latest Ref Range: 0.1-1.0 K/uL 0.6  Eosinophils Absolute Latest Ref Range: 0.0-0.7 K/uL 0.0  Basophils Absolute Latest Ref Range: 0.0-0.1 K/uL 0.0  RBC Morphology Unknown POLYCHROMASIA PRE...  WBC Morphology Unknown WHITE COUNT CONFI...  Smear Review Unknown LARGE PLATELETS P...    RADIOGRAPHIC STUDIES: I have personally reviewed the radiological reports as listed.  Study Result     CLINICAL DATA: Initial  treatment strategy for primary  tonsillar squamous cell carcinoma. Left-sided lung tonsillectomy.  EXAM: NUCLEAR MEDICINE PET SKULL BASE TO THIGH  TECHNIQUE: 8.8 mCi F-18 FDG was injected intravenously. Full-ring PET imaging was performed from the skull base to thigh after the radiotracer. CT data was obtained and used for attenuation correction and anatomic localization.  FASTING BLOOD GLUCOSE: Value: 116 mg/dl  COMPARISON: Clinic note of 07/24/2015. Neck and chest CTs of 06/25/2015.  FINDINGS: NECK  No tonsilar hypermetabolism.  Hypermetabolic left-sided level 2 nodes. Index node measures 2.1 cm and a S.U.V. max of 17.9 on image 25/series 4. 1.9 cm on 06/25/2015.  Immediately inferior node measures 9 mm and a S.U.V. max of 3.8 on image 30/series 4.  CHEST  No areas of abnormal hypermetabolism.  ABDOMEN/PELVIS  No areas of abnormal hypermetabolism.  SKELETON  No abnormal marrow activity.  CT IMAGES PERFORMED FOR ATTENUATION CORRECTION  carotid atherosclerosis. Mild cardiomegaly with multivessel coronary artery atherosclerosis. Mild centrilobular emphysema. Mild bibasilar atelectasis. Normal adrenal glands. Gallstones and a contracted gallbladder. Moderate cirrhosis. Advanced abdominal aortic atherosclerosis. Scattered colonic diverticula. Mild prostatomegaly. Fat containing left inguinal hernia. Prior right inguinal hernia repair.  IMPRESSION: 1. Hypermetabolic left-sided level 2 adenopathy, consistent with nodal metastasis. 2. No extracervical metastatic disease identified. 3. Cirrhosis. 4. Atherosclerosis, including within the coronary arteries. 5. Cholelithiasis.   Electronically Signed  By: Abigail Miyamoto M.D.  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 He is seen at [facility], Hepatitis C negative CT neck with contrast  06/25/2015 at [place] level 2 malignant LAD, largest node is 3.5 cm long axis. R worse than L carotid atherosclerosis and stenosis. CT chest with no acute or significant findings within the chest [facility] stay secondary to intractable n/v and mucositis Oropharyngeal candidiasis/esophageal candidiasis Odynophagia Fever Shingles  I suspected oral/esophageal candidiasis. He has had 2 doses IV diflucan, oral thrush is visibly improved. Fever remains with Tmax at 101.5. Shingles noted on R lateral neck R shoulder and arm ? L upper arm. C5,C6 dermatomal distrubutions.  Given his immunocompromised state his difficulties with swallowing, multiple medical issues, fever he will be admitted for ongoing IV acyclovir, diflucan. Additional CBC with diff and CMP will be drawn once admitted.   Orders Placed This Encounter  Procedures  . acyclovir (ZOVIRAX) per pharmacy consult    Standing Status: Standing     Number of Occurrences: 1     Standing Expiration Date:     All questions were answered. The patient knows to call the clinic with any problems, questions or concerns.  This document serves as a record of services personally performed by Ancil Linsey, MD. It was created on her behalf by Toni Amend, a trained medical scribe. The creation of this record is based on the scribe's personal observations and the provider's statements to them. This document has been checked and approved by the attending provider.  I have reviewed the above documentation for accuracy and completeness, and I agree with the above.  This note was electronically signed.    Molli Hazard, MD  10/17/2015 10:23 AM

## 2015-10-17 NOTE — H&P (Addendum)
TRH H&P   Patient Demographics:    Frank Castillo, is a 65 y.o. male  MRN: [id number]   DOB - 1950/10/24  Admit Date - 10/17/2015  Outpatient Primary MD for the patient is No primary care provider on file.  Referring MD: Dr. Whitney Muse  Outpatient Specialists: Dr. Whitney Muse   Patient coming from: [place]  Chief complaint Odynophagia    HPI:    Frank Castillo  is a 64 y.o. 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 He also reported fever for the past 2-3 days (MAXIMUM TEMPERATURE of 101.68F). He reports having difficulty swallowing both solid and liquid with increased mucositis and odynophagia. He has a PEG tube through which he gets his feeding for the past 4 weeks. He also reports mild dysuria. He was started on IV Diflucan on 5/23 however his symptoms with persistent and was seen in the office today. He reports that he has been using lidocaine viscous solution without much help. He was also given oral nystatin and Magic mouthwash which worsened his symptoms and stopped taking them.  Patient also noticed a painful rash on the lateral side of his right neck for the past 2-3 days. Patient reports mild headache and some nausea but no vomiting. Denies any chest pain, shortness of breath, palpitations, abdominal pain, diarrhea, pain in his joints, tingling or numbness of his extremities.  Vitals showed temperature of 99 the Fahrenheit, otherwise normal. Blood work on 5/23 showed WBC of 3.1, Humulin 8.5, platelets 147, sodium 131, chloride 97, normal liver  function.   Review of systems:    In addition to the HPI above, Fever and chills, mild headache No changes with Vision or hearing, Difficulty swallowing food or Liquids, No Chest pain, Cough or Shortness of Breath, No Abdominal pain, No Nausea or Vommitting, Bowel movements are regular, No Blood in stool or Urine, Mild dysuria Left lateral neck rash No new joints pains-aches,  No new weakness, tingling, numbness in any extremity, No recent weight gain or loss, No polyuria, polydypsia or polyphagia, No significant Mental Stressors.  A full 10 point Review of Systems was done, except as stated above, all other Review of Systems were negative.   With Past History of the following :    Past Medical History  Diagnosis Date  . Tonsillar mass     left  . Portal hypertension ([place])   . Hypertension   . Cirrhosis [facility])       Past Surgical History  Procedure Laterality Date  . Hernia repair Right   . Mouth surgery    . Tonsillectomy  Left 07/09/2015    Procedure: LEFT TONSILLECTOMY;  Surgeon: Leta Baptist, MD;  Location: [place];  Service: ENT;  Laterality: Left;      Social History:     Social History  Substance Use Topics  . Smoking status: Current Every Day Smoker -- 0.50 packs/day    Types: Cigarettes  . Smokeless tobacco: Never Used  . 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 Macular degeneration Mother   . Alcoholism Father       Home Medications:   Prior to Admission medications   Medication Sig Start Date End Date Taking? Authorizing Provider  CARBOPLATIN IV Inject into the vein. Once every 21 days x 3 cycles    Historical Provider, MD  cetirizine (ZYRTEC) 10 MG tablet Take 10 mg by mouth daily.    Historical Provider, MD  cholecalciferol (VITAMIN D) 1000 units tablet Take 1,000 Units by mouth daily.     Historical Provider, MD  clindamycin (CLINDAGEL) 1 % gel Apply topically 2 (two) times daily. 10/04/15   Patrici Ranks, MD  Diphenhyd-Hydrocort-Nystatin (FIRST-DUKES MOUTHWASH) SUSP Use as directed 5 mLs in the mouth or throat 4 (four) times daily -  before meals and at bedtime. 10/14/15   Baird Cancer, PA-C  fentaNYL (DURAGESIC - DOSED MCG/HR) 50 MCG/HR Place 1 patch (50 mcg total) onto the skin every 3 (three) days. 10/09/15   Baird Cancer, PA-C  guaiFENesin (ROBITUSSIN) 100 MG/5ML SOLN Take 5 mLs (100 mg total) by mouth every 6 (six) hours as needed for cough or to loosen phlegm. 09/09/15   Baird Cancer, PA-C  lidocaine (XYLOCAINE) 2 % solution Use as directed 20 mLs in the mouth or throat every 4 (four) hours as needed for mouth pain.  09/10/15   Historical Provider, MD  lidocaine-prilocaine (EMLA) cream Apply a quarter size amount to port site 1 hour prior to chemo. Do not rub in. Cover with plastic wrap. 08/15/15   Patrici Ranks, MD  lisinopril-hydrochlorothiazide (PRINZIDE,ZESTORETIC) 20-25 MG tablet Take 1 tablet by mouth daily. Reported on 09/25/2015 07/17/15   Historical Provider, MD  LORazepam (ATIVAN) 0.5 MG tablet Take 1 tablet (0.5 mg total) by mouth every 4 (four) hours. 10/09/15   Baird Cancer, PA-C  Melatonin 10 MG TABS Take 1 tablet by mouth daily as needed (sleep). Reported on 09/25/2015    Historical Provider, MD  metroNIDAZOLE (FLAGYL) 500 MG tablet TAKE 1 TABLET BY MOUTH THREE TIMES DAILY 10/03/15   Baird Cancer, PA-C  mupirocin ointment (BACTROBAN) 2 % Place 1 application into the nose 2 (two) times daily. Patient taking differently: Place 1 application into the nose 2 (two) times daily. Apply to rash on thighs and groin 09/18/15   Baird Cancer, PA-C  nicotine (NICODERM CQ) 14 mg/24hr patch apply 21 mg patch daily x6wk, then 14 mg patch daily x2wk, then 7 mg patch daily x2wk 08/02/15   Eppie Gibson, MD  nicotine (NICODERM CQ) 21 mg/24hr patch apply 21 mg patch  daily x6wk, then 14 mg patch daily x2wk, then 7 mg patch daily x2wk Patient not taking: Reported on 09/25/2015 08/02/15   Eppie Gibson, MD  nicotine (NICODERM CQ) 7 mg/24hr patch apply 21 mg patch daily x6wk, then 14 mg patch daily x2wk, then  7 mg patch daily x2wk Patient not taking: Reported on 09/25/2015 08/02/15   Eppie Gibson, MD  Nutritional Supplements (JEVITY 1.2 CAL/FIBER) LIQD 8 cans/day.  4 feeding of 2 cans each. Flush with 100 mls before and after each feeding 08/23/15   Patrici Ranks, MD  nystatin (MYCOSTATIN) 100000 UNIT/ML suspension Take 5 mLs (500,000 Units total) by mouth 4 (four) times daily. 09/25/15   Baird Cancer, PA-C  ondansetron (ZOFRAN ODT) 8 MG disintegrating tablet Take 1 tablet (8 mg total) by mouth every 8 (eight) hours as needed for nausea or vomiting. 10/11/15   Baird Cancer, PA-C  oxyCODONE (ROXICODONE) 5 MG/5ML solution Take 5-10 mLs (5-10 mg total) by mouth every 4 (four) hours as needed for severe pain. 10/14/15   Baird Cancer, PA-C  pantoprazole (PROTONIX) 40 MG tablet Take 1 tablet (40 mg total) by mouth daily. 08/23/15   Patrici Ranks, MD  prochlorperazine (COMPAZINE) 10 MG tablet Take 1 tablet (10 mg total) by mouth every 6 (six) hours as needed for nausea or vomiting. 08/15/15   Patrici Ranks, MD  silver sulfADIAZINE (SILVADENE) 1 % cream  09/24/15   Historical Provider, MD  sodium fluoride (FLUORISHIELD) 1.1 % GEL dental gel Instill one drop of gel per tooth space of fluoride tray. Place over teeth for 5 minutes. Remove. Spit out excess. Repeat nightly. 08/16/15   Patrici Ranks, MD  sucralfate (CARAFATE) 1 g tablet Reported on 09/25/2015 08/28/15   Historical Provider, MD  traMADol (ULTRAM) 50 MG tablet Take 1 tablet (50 mg total) by mouth every 6 (six) hours as needed. Patient not taking: Reported on 09/25/2015 09/16/15   Baird Cancer, PA-C     Allergies:     Allergies  Allergen Reactions  . Bee Venom Swelling     Physical Exam:    Vitals  Blood pressure 120/65, pulse 82, temperature 98.6 F (37 C), temperature source Oral, resp. rate 18, height 5\' 10"  (1.778 m), weight 77.284 kg (170 lb 6.1 oz), SpO2 99 %.   Middle aged male lying in bed not in distress, appears fatigued HEENT: Pallor present, moist mucosa, posterior pharyngeal wall candidiasis noted (oral candidiasis appears to have resolved), no cervical lymphadenopathy, supple neck, herpetic rash over right lateral side of the neck, no discharge or eruption Chest: Right-sided Port-A-Cath, clear to auscultation bilaterally, no added sounds CVS: Normal S1 and S2, no murmurs rub or gallop GI: PEG tube in place, soft, nondistended, nontender, bowel sounds present Musculoskeletal: Warm, no edema  CNS: Alert and oriented   Data Review:    CBC  Recent Labs Lab 10/15/15 1124  WBC 3.1*  HGB 8.5*  HCT 25.6*  PLT 147*  MCV 92.1  MCH 30.6  MCHC 33.2  RDW 17.7*  LYMPHSABS 0.6*  MONOABS 0.6  EOSABS 0.0  BASOSABS 0.0   ------------------------------------------------------------------------------------------------------------------  Chemistries   Recent Labs Lab 10/15/15 1124  NA 131*  K 4.1  CL 97*  CO2 29  GLUCOSE 120*  BUN 16  CREATININE 0.67  CALCIUM 8.2*  MG 1.8  AST 11*  ALT 10*  ALKPHOS 34*  BILITOT 0.4   ------------------------------------------------------------------------------------------------------------------ estimated creatinine clearance is 98.9 mL/min (by C-G formula based on Cr of 0.67). ------------------------------------------------------------------------------------------------------------------ No results for input(s): TSH, T4TOTAL, T3FREE, THYROIDAB in the last 72 hours.  Invalid input(s): FREET3  Coagulation profile No results for input(s): INR, PROTIME in the last 168 hours. ------------------------------------------------------------------------------------------------------------------- No results for  input(s): DDIMER in the last 72 hours. -------------------------------------------------------------------------------------------------------------------  Cardiac Enzymes No results for input(s): CKMB, TROPONINI, MYOGLOBIN in the last 168 hours.  Invalid input(s): CK ------------------------------------------------------------------------------------------------------------------ No results found for: BNP   ---------------------------------------------------------------------------------------------------------------  Urinalysis    Component Value Date/Time   COLORURINE YELLOW 09/27/2015 1058   COLORURINE Amber 10/23/2011 0002   APPEARANCEUR CLEAR 09/27/2015 1058   APPEARANCEUR Cloudy 10/23/2011 0002   LABSPEC 1.010 09/27/2015 1058   LABSPEC 1.026 10/23/2011 0002   PHURINE 7.0 09/27/2015 1058   PHURINE 5.0 10/23/2011 0002   GLUCOSEU NEGATIVE 09/27/2015 1058   GLUCOSEU Negative 10/23/2011 0002   HGBUR NEGATIVE 09/27/2015 1058   HGBUR Negative 10/23/2011 0002   BILIRUBINUR NEGATIVE 09/27/2015 1058   BILIRUBINUR 1+ 10/23/2011 0002   KETONESUR NEGATIVE 09/27/2015 1058   KETONESUR 1+ 10/23/2011 0002   PROTEINUR NEGATIVE 09/27/2015 1058   PROTEINUR 30 mg/dL 10/23/2011 0002   NITRITE NEGATIVE 09/27/2015 1058   NITRITE Negative 10/23/2011 0002   LEUKOCYTESUR NEGATIVE 09/27/2015 1058   LEUKOCYTESUR Negative 10/23/2011 0002    ----------------------------------------------------------------------------------------------------------------   Imaging Results:    No results found.  My personal review of EKG: None   Assessment & Plan:    Principal Problem:   Candidiasis of mouth and ?esophagus ([place]) Admit to MedSurg. Keep nothing by mouth. Resume IV Diflucan. Continue viscous lidocaine. Resume home pain medications. GI consult placed for possible need of endoscopy. IV PPI.  Active Problems: Neutropenic fever No clear etiology (diffuse oropharyngeal candidiasis and shingles  may be the source). Maintain neutropenic precaution. Check blood cultures, UA and urine culture. Will place him on empiric vancomycin and cefepime.   Herpes zoster of the neck Started on IV acyclovir. Maintain precautions.    Cancer of tonsillar fossa ([place]) Status post surgical resection and chemotherapy. Follows with Dr. Whitney Muse whom I have added as a consult.    Hypertension Stable.    Cirrhosis ([place]) with portal hypertension  stable.       Hyponatremia Gentle hydration.   Pancytopenia Secondary to chemotherapy and cirrhosis of liver  Tobacco abuse Counseled on cessation. Continue nicotine patch   Diet: Resume tube feeds. Nothing by mouth   DVT Prophylaxis : Subcutaneous Lovenox  AM Labs Ordered, also please review Full Orders  Family Communication: Discussed plan with patient and his sister at bedside    Code Status Full code  Likely DC to  home  Condition GUARDED    Consults called: Oncology/GI    Admission status: Inpatient  Time spent in minutes : 70   Louellen Molder M.D on 10/17/2015 at 5:34 PM  Between 7am to 7pm - Pager - [phone]. After 7pm go to [email or web address] - password [facility]  [facility] - Office  [phone]

## 2015-10-17 NOTE — Patient Instructions (Signed)
[place] at [facility] Discharge Instructions  RECOMMENDATIONS MADE BY THE CONSULTANT AND ANY TEST RESULTS WILL BE SENT TO YOUR REFERRING PHYSICIAN.   Fluids today We are going to admit you for additional care   Thank you for choosing [place] at [facility] to provide your oncology and hematology care.  To afford each patient quality time with our provider, please arrive at least 15 minutes before your scheduled appointment time.   Beginning January 23rd 2017 lab work for the [facility] will be done in the  Main lab at [facility] on 1st floor. If you have a lab appointment with the [facility] please come in thru the  Main Entrance and check in at the main information desk  You need to re-schedule your appointment should you arrive 10 or more minutes late.  We strive to give you quality time with our providers, and arriving late affects you and other patients whose appointments are after yours.  Also, if you no show three or more times for appointments you may be dismissed from the clinic at the providers discretion.     Again, thank you for choosing [facility].  Our hope is that these requests will decrease the amount of time that you wait before being seen by our physicians.       _____________________________________________________________  Should you have questions after your visit to [facility], please contact our office at [phone] between the hours of 8:30 a.m. and 4:30 p.m.  Voicemails left after 4:30 p.m. will not be returned until the following business day.  For prescription refill requests, have your pharmacy contact our office.         Resources For Cancer Patients and their Caregivers ? American Cancer Society: Can assist with transportation, wigs, general needs, runs Look Good Feel Better.        330-251-6644 ? Cancer Care: Provides financial assistance, online support groups,  medication/co-pay assistance.  1-800-813-HOPE 5670785350) ? [facility] Assists [place] cancer patients and their families through emotional , educational and financial support.  (973) 557-7696 ? [place] DSS Where to apply for food stamps, Medicaid and utility assistance. 531-321-2131 ? [facility]: Transportation to medical appointments. (714)243-8546 ? Social Security Administration: May apply for disability if have a Stage IV cancer. 616-738-9791 8123029944 ? [employer], Disability and Transit Services: Assists with nutrition, care and transit needs. [place] Support Programs: @10RELATIVEDAYS @ > Cancer Support Group  2nd Tuesday of the month 1pm-2pm, Journey Room  > Creative Journey  3rd Tuesday of the month 1130am-1pm, Journey Room  > Look Good Feel Better  1st Wednesday of the month 10am-12 noon, Journey Room (Call [place] to register [phone])

## 2015-10-17 NOTE — Progress Notes (Signed)
Frank Castillo Tolerated fluids today  Dr Whitney Muse at bedside to look at rash on pt  Pt spitting up blood, normal for this pt, Dr Whitney Muse notified   Pt is going to be admitted   1512 Report called to [facility] on 3rd floor  1520 pt taken to third floor room 341

## 2015-10-18 ENCOUNTER — Ambulatory Visit (HOSPITAL_COMMUNITY): Payer: Self-pay

## 2015-10-18 ENCOUNTER — Other Ambulatory Visit (HOSPITAL_COMMUNITY): Payer: Self-pay | Admitting: Oncology

## 2015-10-18 ENCOUNTER — Other Ambulatory Visit (HOSPITAL_COMMUNITY): Payer: Self-pay | Admitting: Hematology & Oncology

## 2015-10-18 DIAGNOSIS — D709 Neutropenia, unspecified: Secondary | ICD-10-CM

## 2015-10-18 DIAGNOSIS — C090 Malignant neoplasm of tonsillar fossa: Secondary | ICD-10-CM

## 2015-10-18 DIAGNOSIS — K7460 Unspecified cirrhosis of liver: Secondary | ICD-10-CM

## 2015-10-18 DIAGNOSIS — T451X5A Adverse effect of antineoplastic and immunosuppressive drugs, initial encounter: Secondary | ICD-10-CM | POA: Diagnosis present

## 2015-10-18 DIAGNOSIS — D61810 Antineoplastic chemotherapy induced pancytopenia: Secondary | ICD-10-CM

## 2015-10-18 DIAGNOSIS — B370 Candidal stomatitis: Principal | ICD-10-CM

## 2015-10-18 DIAGNOSIS — K7030 Alcoholic cirrhosis of liver without ascites: Secondary | ICD-10-CM

## 2015-10-18 DIAGNOSIS — E871 Hypo-osmolality and hyponatremia: Secondary | ICD-10-CM

## 2015-10-18 DIAGNOSIS — D6481 Anemia due to antineoplastic chemotherapy: Secondary | ICD-10-CM

## 2015-10-18 DIAGNOSIS — R5081 Fever presenting with conditions classified elsewhere: Secondary | ICD-10-CM

## 2015-10-18 DIAGNOSIS — B029 Zoster without complications: Secondary | ICD-10-CM

## 2015-10-18 DIAGNOSIS — K766 Portal hypertension: Secondary | ICD-10-CM

## 2015-10-18 DIAGNOSIS — B3781 Candidal esophagitis: Secondary | ICD-10-CM

## 2015-10-18 DIAGNOSIS — R509 Fever, unspecified: Secondary | ICD-10-CM

## 2015-10-18 DIAGNOSIS — R1310 Dysphagia, unspecified: Secondary | ICD-10-CM

## 2015-10-18 LAB — CBC
HCT: 27.7 % — ABNORMAL LOW (ref 39.0–52.0)
HEMOGLOBIN: 9.3 g/dL — AB (ref 13.0–17.0)
MCH: 30.2 pg (ref 26.0–34.0)
MCHC: 33.6 g/dL (ref 30.0–36.0)
MCV: 89.9 fL (ref 78.0–100.0)
PLATELETS: 118 10*3/uL — AB (ref 150–400)
RBC: 3.08 MIL/uL — ABNORMAL LOW (ref 4.22–5.81)
RDW: 17.4 % — ABNORMAL HIGH (ref 11.5–15.5)
WBC: 3.5 10*3/uL — ABNORMAL LOW (ref 4.0–10.5)

## 2015-10-18 LAB — CBC WITH DIFFERENTIAL/PLATELET
BASOS ABS: 0 10*3/uL (ref 0.0–0.1)
BASOS PCT: 0 %
Eosinophils Absolute: 0 10*3/uL (ref 0.0–0.7)
Eosinophils Relative: 0 %
HCT: 23.4 % — ABNORMAL LOW (ref 39.0–52.0)
HEMOGLOBIN: 7.9 g/dL — AB (ref 13.0–17.0)
LYMPHS ABS: 0.6 10*3/uL — AB (ref 0.7–4.0)
LYMPHS PCT: 22 %
MCH: 30.6 pg (ref 26.0–34.0)
MCHC: 33.8 g/dL (ref 30.0–36.0)
MCV: 90.7 fL (ref 78.0–100.0)
MONOS PCT: 12 %
Monocytes Absolute: 0.3 10*3/uL (ref 0.1–1.0)
NEUTROS ABS: 1.9 10*3/uL (ref 1.7–7.7)
Neutrophils Relative %: 66 %
Platelets: 112 10*3/uL — ABNORMAL LOW (ref 150–400)
RBC: 2.58 MIL/uL — ABNORMAL LOW (ref 4.22–5.81)
RDW: 17.7 % — ABNORMAL HIGH (ref 11.5–15.5)
WBC: 2.8 10*3/uL — ABNORMAL LOW (ref 4.0–10.5)

## 2015-10-18 LAB — URINALYSIS, ROUTINE W REFLEX MICROSCOPIC
Bilirubin Urine: NEGATIVE
GLUCOSE, UA: NEGATIVE mg/dL
HGB URINE DIPSTICK: NEGATIVE
KETONES UR: NEGATIVE mg/dL
Leukocytes, UA: NEGATIVE
Nitrite: NEGATIVE
PH: 6 (ref 5.0–8.0)
PROTEIN: NEGATIVE mg/dL
Specific Gravity, Urine: <1.005 — ABNORMAL LOW (ref 1.005–1.030)

## 2015-10-18 LAB — PREPARE RBC (CROSSMATCH)

## 2015-10-18 LAB — BASIC METABOLIC PANEL
Anion gap: 6 (ref 5–15)
BUN: 8 mg/dL (ref 6–20)
CALCIUM: 8.2 mg/dL — AB (ref 8.9–10.3)
CO2: 25 mmol/L (ref 22–32)
Chloride: 102 mmol/L (ref 101–111)
Creatinine, Ser: 0.52 mg/dL — ABNORMAL LOW (ref 0.61–1.24)
GFR calc Af Amer: >60 mL/min (ref >60)
GLUCOSE: 126 mg/dL — AB (ref 65–99)
POTASSIUM: 3.8 mmol/L (ref 3.5–5.1)
Sodium: 133 mmol/L — ABNORMAL LOW (ref 135–145)

## 2015-10-18 MED ORDER — OXYCODONE HCL 5 MG PO TABS
5.0000 mg | ORAL_TABLET | Freq: Three times a day (TID) | ORAL | Status: DC
Start: 2015-10-18 — End: 2015-10-19
  Administered 2015-10-18 – 2015-10-19 (×3): 5 mg via ORAL
  Filled 2015-10-18 (×3): qty 1

## 2015-10-18 MED ORDER — SODIUM CHLORIDE 0.9 % IV SOLN
Freq: Once | INTRAVENOUS | Status: AC
  Administered 2015-10-18: 14:00:00 via INTRAVENOUS

## 2015-10-18 MED ORDER — PANTOPRAZOLE SODIUM 40 MG PO PACK
40.0000 mg | PACK | Freq: Every day | ORAL | Status: DC
  Administered 2015-10-18 – 2015-10-19 (×2): 40 mg
  Filled 2015-10-18 (×2): qty 20

## 2015-10-18 MED ORDER — VANCOMYCIN HCL IN DEXTROSE 750-5 MG/150ML-% IV SOLN
INTRAVENOUS | Status: AC
  Filled 2015-10-18: qty 150

## 2015-10-18 MED ORDER — MORPHINE SULFATE (PF) 4 MG/ML IV SOLN
5.0000 mg | INTRAVENOUS | Status: DC | PRN
Start: 2015-10-18 — End: 2015-10-20
  Administered 2015-10-18: 5 mg via INTRAVENOUS
  Filled 2015-10-18: qty 2

## 2015-10-18 NOTE — Progress Notes (Signed)
Initial Nutrition Assessment  DOCUMENTATION CODES:  Non-severe (moderate) malnutrition in context of chronic illness   Pt meets criteria for MODERATE MALNUTRITION in the context of Chronic Illness as evidenced by Loss of >10% bw in 3 months and mild-mod fat/muscle wasting.  INTERVENTION:  His outpatient TF regimen is still adequate based on current estimated needs: Jevity 1.2. 8 cans. Divided into 4 meals of 2 cans each.  100 mls flush before and after each meal for extra 800 ccs fluid. Provides: 2275 kcals, 106 g  Pro, 1528 liters fluid  If patient is unable to tolerate the 8 cans bolus, pump feeding should be started to ensure patient is meeting requirements.   NUTRITION DIAGNOSIS:  Increased nutrient needs related to cancer and cancer related treatments + acute illness as evidenced by estimated nutritional requirements for this condition  GOAL:  Patient will meet greater than or equal to 90% of their needs  MONITOR:  TF tolerance, Skin, Labs, Weight trends  REASON FOR ASSESSMENT:  Malnutrition Screening Tool    ASSESSMENT:  65 y/o male PMHx HTN, compensated alcoholic cirrhosis and Carcinoma of left tonsil S/P left tonsillectomy and just recently finished Concomitant radiation + chemotherapy . PEG had been placed prior to treatment. Admitted from oncology appointment due to painful rash to neck (shingles), neutropenic fever,  worsening thrush w/ odynophagia, muscositis.   Pt is very well known to this RD as he has been followed closely the past few months as outpatient at the [facility], last seen 9 days ago. He has PEG for feeding. He started treatment at weight of roughly 183 lbs. He was late to initiate Tube feeding which resulted in a 8 lb wt loss early in treatment. He has lost about 11 more lbs during treatment. He has largely been unable to achieve the reccommended goal of 8 cans for unknown reason though thought to be related to noncompliance/severe malaise. He mostly reported only  administering 6 cans/day. He has not ever reported intolerance to the feeds and he was encouraged at last meeting to ATLEAST achieve 7 cans, with 8 being goal. He had met with SLP outpatient  His TF regimen goal throughout treatment has been Jevity 1.2. 8 cans. Divided into 4 meals of 2 cans each. Flush with 100 mls before and after each meal for extra 800 ccs fluid  Today, pt reports that he had been administering 6-8 cans since last seen. He is seen gagging and spitting into trash can at bedside. He reports intense pain in mouth and throat. He has no PO intake.   Emphasized that pt is finished with therapy and now he is on the upswing. Encouraged him to continue to strive for 8 cans per day, but acknowledged how difficult it must be when he is this ill. RD asked about oral intake. He says he has no PO intake, but He met again with Alexis Goodell, SLP just a few days prior. RD to follow inpatient to make sure he is able to meet requirements and will continue to follow outpatient as needed.   NFPE: Visual, mild-mod upper body wasting  Labs reviewed: Pancytopenia, albumin: 2.7, Total pro: 6.0    Recent Labs Lab 10/15/15 1124 10/17/15 2137 10/18/15 0505  NA 131* 129* 133*  K 4.1 3.8 3.8  CL 97* 97* 102  CO2 _0 BUN _1 CREATININE 0.67 0.54* 0.52*  CALCIUM 8.2* 8.0* 8.2*  MG 1.8  --   --   GLUCOSE 120* 129* 126*  Diet Order:  Diet NPO time specified Except for: Sips with Meds  Skin:Rash to neck, face, arm, shoulder  Last BM:  5/18  Height:  Ht Readings from Last 1 Encounters:  10/17/15 _0  (1.778 m)    Weight:  Wt Readings from Last 1 Encounters:  10/17/15 170 lb 6.1 oz (77.284 kg)   Wt Readings from Last 10 Encounters:  10/17/15 170 lb 6.1 oz (77.284 kg)  10/17/15 170 lb 9.6 oz (77.384 kg)  10/14/15 164 lb 3.2 oz (74.481 kg)  10/09/15 164 lb 12.8 oz (74.753 kg)  10/07/15 164 lb 6.4 oz (74.571 kg)  10/04/15 165 lb 12.8 oz (75.206 kg)  10/02/15 167 lb 3.2 oz  (75.841 kg)  09/30/15 170 lb (77.111 kg)  09/27/15 171 lb 12.8 oz (77.928 kg)  09/25/15 170 lb (77.111 kg)   Ideal Body Weight:  75.45 kg  BMI:  Body mass index is 24.45 kg/(m^2).  Estimated Nutritional Needs:  Kcal:  2100-2300 (28-31 kcal/kg bw) Protein:  97-111 g (1.3-1.5 g/kg bw Fluid:  > 2.3 liters  EDUCATION NEEDS:  No education needs identified at this time  [name] RD, LDN Clinical Nutrition Pager: [phone] 10/18/2015 1:53 PM

## 2015-10-18 NOTE — Progress Notes (Signed)
PROGRESS NOTE    Frank Castillo  [id number] DOB: 1950/09/04 DOA: 10/17/2015 PCP: No primary care provider on file.  Oncologist, Dr. 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 The patient had been treated by oncology with viscous lidocaine and IV Diflucan for mucositis and oral candidiasis for several days. He was also receiving IV fluids 3 times weekly due to dehydration by oncology. The patient presented with a rash on the right lateral side of his neck for the past 2-3 days. He was tentatively diagnosed with herpes zoster. He was given 1 IV acyclovir dose in the oncology clinic. He had been running a low-grade fever at home. On admission he was borderline febrile with a temperature of 99.0. His lab data were significant for hemoglobin of 7.7, platelets 114, sodium of 129, and glucose of 129. His chest x-ray revealed no acute disease. He was admitted for further evaluation and management.   Assessment & Plan:   Principal Problem:   Candidiasis of mouth and esophagus (HCC) Active Problems:   Cancer of tonsillar fossa (HCC)   Shingles rash   Hyponatremia   Antineoplastic chemotherapy induced anemia   Neutropenia, febrile (HCC)   Antineoplastic chemotherapy induced pancytopenia (HCC)   Portal hypertension ([place])   Hypertension   Cirrhosis ([place])   1. Candidiasis and mucositis of the the mouth and presumed esophagitis causing odynophagia and dysphagia. The patient was started on IV Diflucan and IV acyclovir in the oncology office. Both were continued on admission. When necessary viscous lidocaine was also ordered. His pain is being treated with  fentanyl patch, as needed oxycodone; when necessary IV morphine was added. [facility] consult gastroenterology for evaluation of esophageal candidiasis. -Continue as needed Zofran for nausea. Continue IV Protonix.  Herpes zoster/shingles. Herpetic lesions noted in the C5 dermatome on the right side of his lower face and neck. -Acyclovir was continued.  Borderline neutropenic fever. Patient's fever has been low-grade during the hospital course. His chest x-ray is nonacute. Urinalysis and urine culture is pending. His fever has resolved. Blood cultures are negative to date. -We'll continue vancomycin and cefepime for another 24-48 hours, but low threshold for stopping antibiotics with no obvious source of infection and with a WBC of above 2000.  Pancytopenia, secondary to chemotherapy. Patient's hemoglobin was 7.7 on admission and platelet count was 114. WBC was 4.0, but decreased to 2.8. -We will transfuse 1 unit of packed red blood cells. -We'll continue to monitor his blood counts. Platelet transfusion is not needed at this time.  Hyponatremia, likely secondary to hypovolemia. Patient's serum sodium was 129 on admission. He was started on IV fluids with normal saline. His serum sodium has improved. -We'll discontinue hydrochlorothiazide.  Hypertension. Patient is treated chronically with lisinopril/HCTZ. His blood pressure is stable, but will discontinue hydrochlorothiazide due to hyponatremia.  History of cirrhosis with portal hypertension. Currently stable. He is [facility]. His hepatitis C was negative.  Status post PEG tube. Jevity tube feedings have been resumed.   DVT prophylaxis: Lovenox Code Status: Full code Family Communication: Family not available Disposition Plan: Discharged home when clinically appropriate, likely in a few days   Consultants:   Gastroenterology, pending  Procedures:  None  Antimicrobials:  Cefepime 10/17/15>  Vancomycin  10/17/15>  (Antifungal) Diflucan 10/17/15>  (Antiviral) acyclovir 10/17/15>    Subjective: Patient had an episode of vomiting this morning. Zofran did help. He complains of intermittent gagging which he says has been chronic. He continues to have a painful mouth and pain with swallowing. He denies abdominal pain.  Objective: Filed Vitals:   10/17/15 1600 10/17/15 2038 10/17/15 2043 10/18/15 0602  BP: 120/65 128/68  122/69  Pulse: 82 82  80  Temp: 98.6 F (37 C) 98.4 F (36.9 C)  98.1 F (36.7 C)  TempSrc: Oral Oral  Oral  Resp: 18 18  18   Height: 5\' 10"  (1.778 m)     Weight: 77.284 kg (170 lb 6.1 oz)     SpO2: 99% 98% 98% 97%    Intake/Output Summary (Last 24 hours) at 10/18/15 0852 Last data filed at 10/18/15 0600  Gross per 24 hour  Intake 1649.33 ml  Output      0 ml  Net 1649.33 ml   Filed Weights   10/17/15 1600  Weight: 77.284 kg (170 lb 6.1 oz)    Examination:  General exam: Appears calm and comfortable Oropharynx: Mucous membranes moist; few areas of mucositis on the sides of his tongue, but no white plaque or ulcerated lesions or significant erythema on the top of his tongue; difficult to see his posterior pharynx. Respiratory system: Clear to auscultation. Respiratory effort normal. Cardiovascular system: S1 & S2 heard, RRR. No JVD, murmurs, rubs, gallops or clicks. No pedal edema. Gastrointestinal system: Abdomen is nondistended, soft and nontender. No organomegaly or masses felt. Normal bowel sounds heard. Skin: Herpetic lesions/vesicles with erythema noted on the lower right side of his face and neck distributed in the C5 dermatome. Central nervous system: Alert and oriented. No focal neurological deficits. Extremities: Symmetric 5 x 5 power. Psychiatry: Judgement and insight appear normal. Flat affect.     Data Reviewed: I have personally reviewed following labs and imaging studies  CBC:  Recent Labs Lab 10/15/15 1124 10/17/15 2137 10/18/15 0505   WBC 3.1* 4.0 2.8*  NEUTROABS 1.9  --  1.9  HGB 8.5* 7.7* 7.9*  HCT 25.6* 22.9* 23.4*  MCV 92.1 91.6 90.7  PLT 147* 114* [id number]*   Basic Metabolic Panel:  Recent Labs Lab 10/15/15 1124 10/17/15 2137 10/18/15 0505  NA 131* 129* 133*  K 4.1 3.8 3.8  CL 97* 97* 102  CO2 29 25 25   GLUCOSE 120* 129* 126*  BUN 16 11 8   CREATININE 0.67 0.54* 0.52*  CALCIUM 8.2* 8.0* 8.2*  MG 1.8  --   --    GFR: Estimated Creatinine Clearance: 98.9 mL/min (by C-G formula based on Cr of 0.52). Liver Function Tests:  Recent Labs Lab 10/15/15 1124 10/17/15 2137  AST 11* 11*  ALT 10* 11*  ALKPHOS 34* 32*  BILITOT 0.4 0.4  PROT 6.2* 6.0*  ALBUMIN 3.0* 2.7*   No results for input(s): LIPASE, AMYLASE in the last 168 hours. No results for input(s): AMMONIA in the last 168 hours. Coagulation Profile: No results for input(s): INR, PROTIME in the last 168 hours. Cardiac Enzymes: No results for input(s): CKTOTAL, CKMB, CKMBINDEX, TROPONINI in the last 168 hours. BNP (last 3 results) No results for input(s): PROBNP in the last 8760 hours. HbA1C: No results for input(s): HGBA1C in the last 72 hours. CBG: No results for input(s): GLUCAP in the last 168 hours. Lipid Profile: No results for input(s): CHOL, HDL, LDLCALC, TRIG, CHOLHDL, LDLDIRECT in the last  72 hours. Thyroid Function Tests: No results for input(s): TSH, T4TOTAL, FREET4, T3FREE, THYROIDAB in the last 72 hours. Anemia Panel: No results for input(s): VITAMINB12, FOLATE, FERRITIN, TIBC, IRON, RETICCTPCT in the last 72 hours. Sepsis Labs: No results for input(s): PROCALCITON, LATICACIDVEN in the last 168 hours.  Recent Results (from the past 240 hour(s))  Culture, blood (Routine X 2) w Reflex to ID Panel     Status: None (Preliminary result)   Collection Time: 10/17/15  9:37 PM  Result Value Ref Range Status   Specimen Description PORTA CATH  Final   Special Requests BOTTLES DRAWN AEROBIC AND ANAEROBIC 6CC  Final   Culture NO GROWTH  < 12 HOURS  Final   Report Status PENDING  Incomplete  Culture, blood (Routine X 2) w Reflex to ID Panel     Status: None (Preliminary result)   Collection Time: 10/17/15  9:40 PM  Result Value Ref Range Status   Specimen Description LEFT ANTECUBITAL  Final   Special Requests BOTTLES DRAWN AEROBIC AND ANAEROBIC 6CC  Final   Culture NO GROWTH < 12 HOURS  Final   Report Status PENDING  Incomplete         Radiology Studies: Dg Chest Port 1 View  10/17/2015  CLINICAL DATA:  Congestion, possible shingles EXAM: PORTABLE CHEST 1 VIEW COMPARISON:  None. FINDINGS: Heart size mildly enlarged. Vascular pattern normal. Lungs clear. No pleural effusion. Right Port-A-Cath noted. IMPRESSION: Mild cardiac silhouette enlargement.  Otherwise negative Electronically Signed   By: Skipper Cliche M.D.   On: 10/17/2015 20:37        Scheduled Meds: . acyclovir  10 mg/kg Intravenous Q8H  . ceFEPime (MAXIPIME) IV  2 g Intravenous Q8H  . cholecalciferol  1,000 Units Oral Daily  . enoxaparin (LOVENOX) injection  40 mg Subcutaneous Q24H  . feeding supplement (JEVITY 1.2 CAL)  474 mL Per Tube Q6H  . fentaNYL  50 mcg Transdermal Q72H  . fluconazole (DIFLUCAN) IV  200 mg Intravenous Q24H  . lisinopril  20 mg Oral Daily   And  . hydrochlorothiazide  25 mg Oral Daily  . loratadine  10 mg Oral Daily  . LORazepam  0.5 mg Intravenous Q4H  . nicotine  7 mg Transdermal Daily  . pantoprazole (PROTONIX) IV  40 mg Intravenous Q24H  . vancomycin  750 mg Intravenous Q8H   Continuous Infusions: . sodium chloride 100 mL/hr at 10/17/15 1836     LOS: 1 day    Time spent:35-38 minutes    Rexene Alberts, MD [facility] Pager [phone]  If 7PM-7AM, please contact night-coverage [email or web address] Password [facility] 10/18/2015, 8:52 AM

## 2015-10-18 NOTE — Progress Notes (Signed)
Subjective: I personally reviewed and went over laboratory results with the patient.  The results are noted within this dictation.  Patient started chemoXRT on 08/21/2015.  At that time, his CBC was completely WNL.  Therefore, I am not completely convinced that the root of his pancytopenia is from cirrhosis of liver.  More likely secondary to chemoXRT + acute illness.  I personally reviewed and went over radiographic studies with the patient.  The results are noted within this dictation.  Chest xray is unimpressive from 5/25.    Cancer of tonsillar fossa ([place])   08/02/2015 Initial Diagnosis Cancer of tonsillar fossa ([place])   08/20/2015 Procedure Port-a-cath placed by IR   08/20/2015 Procedure G-tube placed by IR   08/21/2015 - 10/09/2015 Radiation Therapy 70 Gy, Dr. Isidore Moos.   08/21/2015 - 09/10/2015 Chemotherapy Cisplatin every 21 days with XRT.   08/28/2015 Adverse Reaction Tinnitis, Cisplatin-induced.   09/11/2015 - 09/30/2015 Chemotherapy Carboplatin/5FU   09/20/2015 - 09/21/2015 Hospital Admission Intractable nausea/vomiting. Severe Mucositis      Objective: Vital signs in last 24 hours: Temp:  [97.7 F (36.5 C)-98.6 F (37 C)] 98.4 F (36.9 C) (05/26 1402) Pulse Rate:  [79-85] 79 (05/26 1402) Resp:  [17-18] 17 (05/26 1402) BP: (110-128)/(61-69) 111/63 mmHg (05/26 1402) SpO2:  [97 %-99 %] 98 % (05/26 1402) Weight:  [170 lb 6.1 oz (77.284 kg)] 170 lb 6.1 oz (77.284 kg) (05/25 1600)  Intake/Output from previous day: 05/25 0800 - 05/26 0759 In: 1649.3 [I.V.:1068.3; IV Piggyback:581] Out: -  Intake/Output this shift: Total I/O In: 1018.2 [I.V.:738.2; Blood:30; IV Piggyback:250] Out: 200 [Urine:200]  General appearance: alert, cooperative, appears stated age, no distress and in bed, no family at the bedside Oropharynx: white patches on buccal mucosa bilaterally and posterior pharynx. Improved overall.  Lab Results:   Recent Labs  10/17/15 2137 10/18/15 0505  WBC 4.0 2.8*  HGB 7.7*  7.9*  HCT 22.9* 23.4*  PLT 114* 112*   BMET  Recent Labs  10/17/15 2137 10/18/15 0505  NA 129* 133*  K 3.8 3.8  CL 97* 102  CO2 25 25  GLUCOSE 129* 126*  BUN 11 8  CREATININE 0.54* 0.52*  CALCIUM 8.0* 8.2*    Studies/Results: Dg Chest Port 1 View  10/17/2015  CLINICAL DATA:  Congestion, possible shingles EXAM: PORTABLE CHEST 1 VIEW COMPARISON:  None. FINDINGS: Heart size mildly enlarged. Vascular pattern normal. Lungs clear. No pleural effusion. Right Port-A-Cath noted. IMPRESSION: Mild cardiac silhouette enlargement.  Otherwise negative Electronically Signed   By: Skipper Cliche M.D.   On: 10/17/2015 20:37    Medications: I have reviewed the patient's current medications.  Assessment/Plan:  Pancytopenia, multifactorial.  Component of past treatment including chemo+XRT in addition to acute illness contributing.  Element of cirrhosis is less likely and less contributory given normal blood counts at the onset of treatment in March 2017.  Stage IVA SCC of left tonsil completing all therapy with curative intent ~ 3 weeks ago.  Oncology history follows:   Cancer of tonsillar fossa ([place])   08/02/2015 Initial Diagnosis Cancer of tonsillar fossa ([place])   08/20/2015 Procedure Port-a-cath placed by IR   08/20/2015 Procedure G-tube placed by IR   08/21/2015 - 10/09/2015 Radiation Therapy 70 Gy, Dr. Isidore Moos.   08/21/2015 - 09/10/2015 Chemotherapy Cisplatin every 21 days with XRT.   08/28/2015 Adverse Reaction Tinnitis, Cisplatin-induced.   09/11/2015 - 09/30/2015 Chemotherapy Carboplatin/5FU   09/20/2015 - 09/21/2015 Hospital Admission Intractable nausea/vomiting. Severe Mucositis    Likely candida esophagitis with  odynophagia and dysphagia- on IV diflucan.  PEG is in place and can be used for hydration and feedings per [name], RD.  GI following, agree with holding off on EGD at this time.  Herpes Zoster- following right dermatome of C5.  Continued on IV acyclovir.  Patient given written  RX for liq Oxycodone and PO Ativan in the event that he is discharged over the weekend.  He reports that he has some liq Oxycodone left at home, but he is unable to quantify.  Additionally, he is concerned about running out over the holiday weekend depending on his discharge date.  Patient and plan discussed with Dr. Ancil Linsey and she is in agreement with the aforementioned.     LOS: 1 day    KEFALAS,THOMAS 10/18/2015 3:03 PM  As Above. Patient seen and examined. Pancytopenia related to treatment/acute illness. Counts prior to initiation of therapy were WNL. Patient's liver disease stable for many years, patient has been abstinent from ETOH for many year. Cont. Diflucan, acyclovir. TF. Will continue weekly outpatient follow-up as well post discharge. Donald Pore MD
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
## 2015-10-19 DIAGNOSIS — E440 Moderate protein-calorie malnutrition: Secondary | ICD-10-CM

## 2015-10-19 LAB — BASIC METABOLIC PANEL
ANION GAP: 6 (ref 5–15)
BUN: 8 mg/dL (ref 6–20)
CHLORIDE: 100 mmol/L — AB (ref 101–111)
CO2: 27 mmol/L (ref 22–32)
Calcium: 8.3 mg/dL — ABNORMAL LOW (ref 8.9–10.3)
Creatinine, Ser: 0.62 mg/dL (ref 0.61–1.24)
GFR calc Af Amer: >60 mL/min (ref >60)
GLUCOSE: 95 mg/dL (ref 65–99)
POTASSIUM: 3.9 mmol/L (ref 3.5–5.1)
Sodium: 133 mmol/L — ABNORMAL LOW (ref 135–145)

## 2015-10-19 LAB — CBC
HEMATOCRIT: 25.8 % — AB (ref 39.0–52.0)
Hemoglobin: 8.6 g/dL — ABNORMAL LOW (ref 13.0–17.0)
MCH: 30.2 pg (ref 26.0–34.0)
MCHC: 33.3 g/dL (ref 30.0–36.0)
MCV: 90.5 fL (ref 78.0–100.0)
PLATELETS: 110 10*3/uL — AB (ref 150–400)
RBC: 2.85 MIL/uL — AB (ref 4.22–5.81)
RDW: 17.9 % — ABNORMAL HIGH (ref 11.5–15.5)
WBC: 2.8 10*3/uL — ABNORMAL LOW (ref 4.0–10.5)

## 2015-10-19 MED ORDER — SENNOSIDES-DOCUSATE SODIUM 8.6-50 MG PO TABS
1.0000 | ORAL_TABLET | Freq: Every day | ORAL | Status: DC
  Administered 2015-10-20: 1 via ORAL
  Filled 2015-10-19: qty 1

## 2015-10-19 MED ORDER — GUAIFENESIN 100 MG/5ML PO SOLN: 5.0000 mL | ORAL | Status: DC | PRN

## 2015-10-19 MED ORDER — OXYCODONE HCL 5 MG/5ML PO SOLN
5.0000 mg | Freq: Three times a day (TID) | ORAL | Status: DC
Start: 2015-10-19 — End: 2015-10-20
  Administered 2015-10-19 – 2015-10-20 (×5): 5 mg via ORAL
  Filled 2015-10-19 (×5): qty 5

## 2015-10-19 NOTE — Progress Notes (Signed)
PROGRESS NOTE    Frank Castillo  [id number] DOB: [birth year] DOA: 10/17/2015 PCP: No primary care provider on file.  Oncologist, Dr. 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 The patient had been treated by oncology with viscous lidocaine and IV Diflucan for mucositis and oral candidiasis for several days. He was also receiving IV fluids 3 times weekly due to dehydration by oncology. The patient presented with a rash on the right lateral side of his neck for the past 2-3 days. He was tentatively diagnosed with herpes zoster. He was given 1 IV acyclovir dose in the oncology clinic. He had been running a low-grade fever at home. On admission he was borderline febrile with a temperature of 99.0. His lab data were significant for hemoglobin of 7.7, platelets 114, sodium of 129, and glucose of 129. His chest x-ray revealed no acute disease. He was admitted for further evaluation and management. -GI was consulted and did not believe the patient needed an upper endoscopy. -His symptoms are abating and he could likely be discharged on 10/20/15 on Diflucan and acyclovir if he continues to improve.   Assessment & Plan:   Principal Problem:   Candidiasis of mouth and esophagus (HCC) Active Problems:   Cancer of tonsillar fossa (HCC)   Shingles rash   Hyponatremia   Antineoplastic chemotherapy induced anemia   Neutropenia, febrile (HCC)   Antineoplastic chemotherapy induced pancytopenia (HCC)   Portal hypertension (HCC)   Hypertension   Cirrhosis (HCC)   Fever   Malnutrition of moderate degree   1. Candidiasis and mucositis of the the mouth and  presumed esophagitis causing odynophagia and dysphagia. The patient was started on IV Diflucan and IV acyclovir in the oncology's office prior to admission. Both were continued on admission. When necessary viscous lidocaine was also ordered. For pain management, he was restarted on with fentanyl patch, as needed viscous lidocaine, and as needed oxycodone. When necessary morphine was added. IV Protonix was started empirically.  -Gastroenterology consulted. [facility] consult gastroenterology for evaluation of esophageal candidiasis. Per Dr. Oneida Alar' examination/assessment, she did not believe the patient needed an EGD as his symptoms were improving. She changed oxycodone to scheduled to better relieve his pain. -Patient's odynophagia and dysphagia have subsided. Continue current management. -Patient could likely be discharged tomorrow on acyclovir and Diflucan via PEG tube.  Herpes zoster/shingles. Herpetic lesions noted in the C5 dermatome on the right side of his lower face and neck. -Acyclovir was continued.  Invasive squamous cell carcinoma of the left tonsil. Status post tonsillectomy, XRT, chemotherapy. He is followed by Dr. Whitney Muse. Their assessment and recommendations noted and appreciated.  Borderline neutropenic fever. Patient's fever was low-grade on admission. He was started empirically on vancomycin and cefepime. His chest x-ray was nonacute. Urinalysis was essentially negative. His fever has resolved. Blood cultures are negative to date. His white blood cell count is 2.8. -We'll discontinue vancomycin and cefepime.  Pancytopenia, secondary to chemotherapy. Patient's hemoglobin was 7.7 on admission and platelet count was 114. WBC was 4.0, but decreased to 2.8. -He was transfused 1 unit of packed red blood cells on 10/18/15 with improvement in his hemoglobin. -We'll continue to monitor his blood counts. Platelet transfusion is not needed at  this time.  Hyponatremia, likely secondary to  hypovolemia. Patient's serum sodium was 129 on admission. He was started on IV fluids with normal saline. His serum sodium has improved. -Hydrochlorothiazide was withheld.  Hypertension. Patient is treated chronically with lisinopril/HCTZ. His blood pressure is stable, but hydrochlorothiazide was discontinued due to hyponatremia.  History of cirrhosis with portal hypertension. Currently stable. He is followed by [facility]. His hepatitis C was negative.  Status post PEG tube. Jevity tube feedings have been resumed.   DVT prophylaxis: Lovenox Code Status: Full code Family Communication: Discussed with his sister. Disposition Plan: Discharged home when clinically appropriate, likely in the next 24 hours.   Consultants:   Gastroenterology  Oncology  Procedures:  1 unit packed red blood cell transfusion on 5/26  Antimicrobials:  Cefepime 10/17/15> 10/19/15  Vancomycin 10/17/15> 10/19/15  (Antifungal) Diflucan 10/17/15>  (Antiviral) acyclovir 10/17/15>    Subjective: Patient has less pain and difficulty swallowing. He does have mucus that is thick and requests guaifenesin. He denies nausea vomiting.  Objective: Filed Vitals:   10/18/15 1402 10/18/15 1555 10/19/15 0000 10/19/15 0600  BP: 111/63 131/75 118/66 118/76  Pulse: 79 78 79 78  Temp: 98.4 F (36.9 C) 98.6 F (37 C) 97.7 F (36.5 C) 98.8 F (37.1 C)  TempSrc: Oral Oral Oral Oral  Resp: 17 17 20 20   Height:      Weight:      SpO2: 98% 100% 94% 95%    Intake/Output Summary (Last 24 hours) at 10/19/15 [id number] Last data filed at 10/19/15 0600  Gross per 24 hour  Intake 918.17 ml  Output   1600 ml  Net -681.83 ml   Filed Weights   10/17/15 1600  Weight: 77.284 kg (170 lb 6.1 oz)    Examination:  General exam: Appears calm and comfortable Oropharynx: Mucous membranes moist; few areas of mucositis on the sides of his tongue, but no white plaque or ulcerated lesions or significant erythema on the top of his  tongue; No obvious posterior pharyngeal erythema or ulcerations. Respiratory system: Clear to auscultation. Respiratory effort normal. Cardiovascular system: S1 & S2 heard, RRR. No JVD, murmurs, rubs, gallops or clicks. No pedal edema. Gastrointestinal system: PEG tube in place around the epigastric area with no surrounding erythema. Abdomen is nondistended, soft and nontender. No organomegaly or masses felt. Normal bowel sounds heard. Skin: Herpetic lesions/vesicles with erythema noted on the lower right side of his face and neck distributed in the C5 dermatome. Central nervous system: Alert and oriented. No focal neurological deficits. Extremities: Symmetric 5 x 5 power. Psychiatry: Judgement and insight appear normal. Flat affect.     Data Reviewed: I have personally reviewed following labs and imaging studies  CBC:  Recent Labs Lab 10/15/15 1124 10/17/15 2137 10/18/15 0505 10/18/15 1807 10/19/15 0604  WBC 3.1* 4.0 2.8* 3.5* 2.8*  NEUTROABS 1.9  --  1.9  --   --   HGB 8.5* 7.7* 7.9* 9.3* 8.6*  HCT 25.6* 22.9* 23.4* 27.7* 25.8*  MCV 92.1 91.6 90.7 89.9 90.5  PLT 147* 114* 112* 118* [id number]*   Basic Metabolic Panel:  Recent Labs Lab 10/15/15 1124 10/17/15 2137 10/18/15 0505 10/19/15 0604  NA 131* 129* 133* 133*  K 4.1 3.8 3.8 3.9  CL 97* 97* 102 100*  CO2 29 25 25 27   GLUCOSE 120* 129* 126* 95  BUN 16 11 8 8   CREATININE 0.67 0.54* 0.52* 0.62  CALCIUM 8.2* 8.0* 8.2* 8.3*  MG 1.8  --   --   --  GFR: Estimated Creatinine Clearance: 98.9 mL/min (by C-G formula based on Cr of 0.62). Liver Function Tests:  Recent Labs Lab 10/15/15 1124 10/17/15 2137  AST 11* 11*  ALT 10* 11*  ALKPHOS 34* 32*  BILITOT 0.4 0.4  PROT 6.2* 6.0*  ALBUMIN 3.0* 2.7*   No results for input(s): LIPASE, AMYLASE in the last 168 hours. No results for input(s): AMMONIA in the last 168 hours. Coagulation Profile: No results for input(s): INR, PROTIME in the last 168 hours. Cardiac  Enzymes: No results for input(s): CKTOTAL, CKMB, CKMBINDEX, TROPONINI in the last 168 hours. BNP (last 3 results) No results for input(s): PROBNP in the last 8760 hours. HbA1C: No results for input(s): HGBA1C in the last 72 hours. CBG: No results for input(s): GLUCAP in the last 168 hours. Lipid Profile: No results for input(s): CHOL, HDL, LDLCALC, TRIG, CHOLHDL, LDLDIRECT in the last 72 hours. Thyroid Function Tests: No results for input(s): TSH, T4TOTAL, FREET4, T3FREE, THYROIDAB in the last 72 hours. Anemia Panel: No results for input(s): VITAMINB12, FOLATE, FERRITIN, TIBC, IRON, RETICCTPCT in the last 72 hours. Sepsis Labs: No results for input(s): PROCALCITON, LATICACIDVEN in the last 168 hours.  Recent Results (from the past 240 hour(s))  Culture, blood (Routine X 2) w Reflex to ID Panel     Status: None (Preliminary result)   Collection Time: 10/17/15  9:37 PM  Result Value Ref Range Status   Specimen Description PORTA CATH  Final   Special Requests BOTTLES DRAWN AEROBIC AND ANAEROBIC 6CC  Final   Culture NO GROWTH 2 DAYS  Final   Report Status PENDING  Incomplete  Culture, blood (Routine X 2) w Reflex to ID Panel     Status: None (Preliminary result)   Collection Time: 10/17/15  9:40 PM  Result Value Ref Range Status   Specimen Description LEFT ANTECUBITAL  Final   Special Requests BOTTLES DRAWN AEROBIC AND ANAEROBIC 6CC  Final   Culture NO GROWTH 2 DAYS  Final   Report Status PENDING  Incomplete         Radiology Studies: Dg Chest Port 1 View  10/17/2015  CLINICAL DATA:  Congestion, possible shingles EXAM: PORTABLE CHEST 1 VIEW COMPARISON:  None. FINDINGS: Heart size mildly enlarged. Vascular pattern normal. Lungs clear. No pleural effusion. Right Port-A-Cath noted. IMPRESSION: Mild cardiac silhouette enlargement.  Otherwise negative Electronically Signed   By: Skipper Cliche M.D.   On: 10/17/2015 20:37        Scheduled Meds: . acyclovir  10 mg/kg Intravenous  Q8H  . ceFEPime (MAXIPIME) IV  2 g Intravenous Q8H  . cholecalciferol  1,000 Units Oral Daily  . enoxaparin (LOVENOX) injection  40 mg Subcutaneous Q24H  . feeding supplement (JEVITY 1.2 CAL)  474 mL Per Tube Q6H  . fentaNYL  50 mcg Transdermal Q72H  . fluconazole (DIFLUCAN) IV  200 mg Intravenous Q24H  . lisinopril  20 mg Oral Daily  . loratadine  10 mg Oral Daily  . LORazepam  0.5 mg Intravenous Q4H  . nicotine  7 mg Transdermal Daily  . oxyCODONE  5 mg Oral TID WC & HS  . pantoprazole sodium  40 mg Per Tube Daily  . vancomycin  750 mg Intravenous Q8H   Continuous Infusions: . sodium chloride 65 mL/hr at 10/18/15 0934     LOS: 2 days    Time spent:30 minutes    Rexene Alberts, MD [facility] Pager [phone]  If 7PM-7AM, please contact night-coverage [email or web address] Password [facility] 10/19/2015, 9:52 AM

## 2015-10-20 LAB — CBC
HEMATOCRIT: 29.2 % — AB (ref 39.0–52.0)
Hemoglobin: 9.7 g/dL — ABNORMAL LOW (ref 13.0–17.0)
MCH: 30.2 pg (ref 26.0–34.0)
MCHC: 33.2 g/dL (ref 30.0–36.0)
MCV: 91 fL (ref 78.0–100.0)
Platelets: 127 10*3/uL — ABNORMAL LOW (ref 150–400)
RBC: 3.21 MIL/uL — AB (ref 4.22–5.81)
RDW: 17.7 % — AB (ref 11.5–15.5)
WBC: 3.1 10*3/uL — AB (ref 4.0–10.5)

## 2015-10-20 LAB — URINE CULTURE: Culture: NO GROWTH

## 2015-10-20 LAB — BASIC METABOLIC PANEL
ANION GAP: 7 (ref 5–15)
BUN: 9 mg/dL (ref 6–20)
CO2: 27 mmol/L (ref 22–32)
Calcium: 8.6 mg/dL — ABNORMAL LOW (ref 8.9–10.3)
Chloride: 100 mmol/L — ABNORMAL LOW (ref 101–111)
Creatinine, Ser: 0.57 mg/dL — ABNORMAL LOW (ref 0.61–1.24)
GFR calc Af Amer: >60 mL/min (ref >60)
GFR calc non Af Amer: >60 mL/min (ref >60)
GLUCOSE: 128 mg/dL — AB (ref 65–99)
POTASSIUM: 3.5 mmol/L (ref 3.5–5.1)
Sodium: 134 mmol/L — ABNORMAL LOW (ref 135–145)

## 2015-10-20 MED ORDER — FLUCONAZOLE 40 MG/ML PO SUSR: 100.0000 mg | Freq: Every day | ORAL | Status: DC

## 2015-10-20 MED ORDER — HEPARIN SOD (PORK) LOCK FLUSH 100 UNIT/ML IV SOLN
500.0000 [IU] | INTRAVENOUS | Status: DC | PRN
  Filled 2015-10-20: qty 5

## 2015-10-20 MED ORDER — METOCLOPRAMIDE HCL 5 MG PO TABS: 5.0000 mg | ORAL_TABLET | Freq: Three times a day (TID) | ORAL | Status: DC

## 2015-10-20 MED ORDER — ACYCLOVIR 200 MG/5ML PO SUSP: 200.0000 mg | Freq: Every day | ORAL | Status: DC

## 2015-10-20 NOTE — Discharge Summary (Signed)
Physician Discharge Summary  Frank Castillo [id number] DOB: 1950/08/02 DOA: 10/17/2015  PCP: No primary care provider on file. Sees Dr. Whitney Muse with oncology mainly for his care  Admit date: 10/17/2015 Discharge date: 10/20/2015  Time spent: 45 minutes  Recommendations for Outpatient Follow-up:  -Will be discharged home today. -Advised to follow-up with Dr. Whitney Muse in 2 weeks or as scheduled.   Discharge Diagnoses:  Principal Problem:   Candidiasis of mouth and esophagus (HCC) Active Problems:   Cancer of tonsillar fossa (HCC)   Hypertension   Cirrhosis ([place])   Portal hypertension (HCC)   Hyponatremia   Antineoplastic chemotherapy induced anemia   Neutropenia, febrile (HCC)   Shingles rash   Antineoplastic chemotherapy induced pancytopenia (HCC)   Fever   Malnutrition of moderate degree   Discharge Condition: Stable and improved  Filed Weights   10/17/15 1600  Weight: 77.284 kg (170 lb 6.1 oz)    History of present illness:  As per Dr. Clementeen Graham on 5/25: Frank Castillo is a 65 y.o. 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 He also reported fever for the past 2-3 days (MAXIMUM TEMPERATURE of 101.62F). He reports having difficulty swallowing both solid and liquid with increased mucositis and odynophagia. He has a PEG tube through which he gets his feeding for the past 4 weeks. He also reports mild dysuria. He was started on IV Diflucan on 5/23 however his symptoms with persistent and was seen in the office today. He reports that he has been using lidocaine viscous solution without much help. He was also given oral nystatin and Magic mouthwash  which worsened his symptoms and stopped taking them.  Patient also noticed a painful rash on the lateral side of his right neck for the past 2-3 days. Patient reports mild headache and some nausea but no vomiting. Denies any chest pain, shortness of breath, palpitations, abdominal pain, diarrhea, pain in his joints, tingling or numbness of his extremities.  Vitals showed temperature of 99 the Fahrenheit, otherwise normal. Blood work on 5/23 showed WBC of 3.1, Humulin 8.5, platelets 147, sodium 131, chloride 97, normal liver function.  Hospital Course:    Candidiasis and mucositis of the the mouth and presumed esophagitis causing odynophagia and dysphagia. The patient was started on IV Diflucan and IV acyclovir in the oncology office. Both were continued on admission. When necessary viscous lidocaine was also ordered. His pain is being treated with fentanyl patch, as needed oxycodone; when necessary IV morphine was added. -GI was consulted who opined there was no need for invasive studies such as EGDs as patient was improving symptomatically. -We'll continue fluconazole suspension for an additional 7 days.  Herpes zoster/shingles. Herpetic lesions noted in the C5 dermatome on the right side of his lower face and neck. -We'll continue acyclovir suspension for an additional 7 days.  Borderline neutropenic fever. -Never had true neutropenic fever. -Culture data remains negative, will discontinue broad-spectrum antibiotics, do not believe further antibiotics are required at this point.  Pancytopenia, secondary to chemotherapy. Patient's hemoglobin was 7.7 on admission and platelet count was 114. WBC was 4.0, but decreased to 2.8. -Has been transfused 1 unit of packed red blood cells. Hb stable at 8.6-9.7 s/p transfusion.  Hyponatremia, likely secondary to hypovolemia. Patient's serum sodium was 129 on admission. He was started on IV fluids with normal saline. His serum sodium has improved to  134 on DC.  Hypertension. -Well controlled.  History of cirrhosis with portal hypertension. Currently stable. He is followed by [facility]. His hepatitis C was negative.  Status post PEG tube. Jevity tube feedings have been resumed.  Procedures:  None   Consultations:  Gastroenterology  Discharge Instructions  Discharge Instructions    Diet - low sodium heart healthy    Complete by:  As directed      Increase activity slowly    Complete by:  As directed             Medication List    STOP taking these medications        mupirocin ointment 2 %  Commonly known as:  BACTROBAN      TAKE these medications        acyclovir 200 MG/5ML suspension  Commonly known as:  ZOVIRAX  Take 5 mLs (200 mg total) by mouth 5 (five) times daily. For 7 days     CARBOPLATIN IV  Inject into the vein. Once every 21 days x 3 cycles     cetirizine 10 MG tablet  Commonly known as:  ZYRTEC  Take 10 mg by mouth daily.     cholecalciferol 1000 units tablet  Commonly known as:  VITAMIN D  Take 1,000 Units by mouth daily.     clindamycin 1 % gel  Commonly known as:  CLINDAGEL  Apply topically 2 (two) times daily.     fentaNYL 50 MCG/HR  Commonly known as:  DURAGESIC - dosed mcg/hr  Place 1 patch (50 mcg total) onto the skin every 3 (three) days.     FIRST-DUKES MOUTHWASH Susp  Use as directed 5 mLs in the mouth or throat 4 (four) times daily -  before meals and at bedtime.     fluconazole 40 MG/ML suspension  Commonly known as:  DIFLUCAN  Place 2.5 mLs (100 mg total) into feeding tube daily. For 7 days     JEVITY 1.2 CAL/FIBER Liqd  8 cans/day.  4 feeding of 2 cans each. Flush with 100 mls before and after each feeding     lidocaine 2 % solution  Commonly known as:  XYLOCAINE  Use as directed 20 mLs in the mouth or throat every 4 (four) hours as needed for mouth pain.     lidocaine-prilocaine cream  Commonly known as:  EMLA  Apply a quarter size amount to port site 1 hour prior  to chemo. Do not rub in. Cover with plastic wrap.     lisinopril-hydrochlorothiazide 20-25 MG tablet  Commonly known as:  PRINZIDE,ZESTORETIC  Take 1 tablet by mouth daily. Reported on 09/25/2015     LORazepam 0.5 MG tablet  Commonly known as:  ATIVAN  Take 1 tablet (0.5 mg total) by mouth every 4 (four) hours.     Melatonin 10 MG Tabs  Take 1 tablet by mouth daily as needed (sleep). Reported on 09/25/2015     metoCLOPramide 5 MG tablet  Commonly known as:  REGLAN  Take 1 tablet (5 mg total) by mouth 4 (four) times daily -  before meals and at bedtime.     nystatin 100000 UNIT/ML suspension  Commonly known as:  MYCOSTATIN  Take 5 mLs (500,000 Units total) by mouth 4 (four) times daily.     ondansetron 8 MG disintegrating tablet  Commonly known as:  ZOFRAN ODT  Take 1 tablet (8 mg total) by mouth every 8 (eight) hours as needed for nausea or vomiting.     oxyCODONE 5 MG/5ML solution  Commonly known as:  ROXICODONE  Take 5-10 mLs (5-10 mg total) by mouth every 4 (four) hours as needed for severe pain.     pantoprazole 40 MG tablet  Commonly known as:  PROTONIX  TAKE 1 TABLET (40 MG TOTAL) BY MOUTH DAILY.     silver sulfADIAZINE 1 % cream  Commonly known as:  SILVADENE  Apply 1 application topically 3 (three) times daily.     sodium fluoride 1.1 % Gel dental gel  Commonly known as:  FLUORISHIELD  Instill one drop of gel per tooth space of fluoride tray. Place over teeth for 5 minutes. Remove. Spit out excess. Repeat nightly.       Allergies  Allergen Reactions  . Bee Venom Swelling       Follow-up Information    Follow up with Molli Hazard, MD. Schedule an appointment as soon as possible for a visit in 2 weeks.   Specialties:  Hematology and Oncology, Oncology   Contact information:   [address] [place] [address] [phone]        The results of significant diagnostics from this hospitalization (including imaging, microbiology, ancillary and  laboratory) are listed below for reference.    Significant Diagnostic Studies: Dg Chest Port 1 View  10/17/2015  CLINICAL DATA:  Congestion, possible shingles EXAM: PORTABLE CHEST 1 VIEW COMPARISON:  None. FINDINGS: Heart size mildly enlarged. Vascular pattern normal. Lungs clear. No pleural effusion. Right Port-A-Cath noted. IMPRESSION: Mild cardiac silhouette enlargement.  Otherwise negative Electronically Signed   By: Skipper Cliche M.D.   On: 10/17/2015 20:37    Microbiology: Recent Results (from the past 240 hour(s))  Culture, blood (Routine X 2) w Reflex to ID Panel     Status: None (Preliminary result)   Collection Time: 10/17/15  9:37 PM  Result Value Ref Range Status   Specimen Description PORTA CATH  Final   Special Requests BOTTLES DRAWN AEROBIC AND ANAEROBIC 6CC  Final   Culture NO GROWTH 3 DAYS  Final   Report Status PENDING  Incomplete  Culture, blood (Routine X 2) w Reflex to ID Panel     Status: None (Preliminary result)   Collection Time: 10/17/15  9:40 PM  Result Value Ref Range Status   Specimen Description LEFT ANTECUBITAL  Final   Special Requests BOTTLES DRAWN AEROBIC AND ANAEROBIC 6CC  Final   Culture NO GROWTH 3 DAYS  Final   Report Status PENDING  Incomplete  Urine culture     Status: None   Collection Time: 10/18/15  1:03 PM  Result Value Ref Range Status   Specimen Description URINE, CLEAN CATCH  Final   Special Requests Immunocompromised  Final   Culture NO GROWTH Performed at [facility]   Final   Report Status 10/20/2015 FINAL  Final     Labs: Basic Metabolic Panel:  Recent Labs Lab 10/15/15 1124 10/17/15 2137 10/18/15 0505 10/19/15 0604 10/20/15 0626  NA 131* 129* 133* 133* 134*  K 4.1 3.8 3.8 3.9 3.5  CL 97* 97* 102 100* 100*  CO2 29 25 25 27 27   GLUCOSE 120* 129* 126* 95 128*  BUN 16 11 8 8 9   CREATININE 0.67 0.54* 0.52* 0.62 0.57*  CALCIUM 8.2* 8.0* 8.2* 8.3* 8.6*  MG 1.8  --   --   --   --  Liver Function  Tests:  Recent Labs Lab 10/15/15 1124 10/17/15 2137  AST 11* 11*  ALT 10* 11*  ALKPHOS 34* 32*  BILITOT 0.4 0.4  PROT 6.2* 6.0*  ALBUMIN 3.0* 2.7*   No results for input(s): LIPASE, AMYLASE in the last 168 hours. No results for input(s): AMMONIA in the last 168 hours. CBC:  Recent Labs Lab 10/15/15 1124 10/17/15 2137 10/18/15 0505 10/18/15 1807 10/19/15 0604 10/20/15 0626  WBC 3.1* 4.0 2.8* 3.5* 2.8* 3.1*  NEUTROABS 1.9  --  1.9  --   --   --   HGB 8.5* 7.7* 7.9* 9.3* 8.6* 9.7*  HCT 25.6* 22.9* 23.4* 27.7* 25.8* 29.2*  MCV 92.1 91.6 90.7 89.9 90.5 91.0  PLT 147* 114* 112* 118* 110* 127*   Cardiac Enzymes: No results for input(s): CKTOTAL, CKMB, CKMBINDEX, TROPONINI in the last 168 hours. BNP: BNP (last 3 results) No results for input(s): BNP in the last 8760 hours.  ProBNP (last 3 results) No results for input(s): PROBNP in the last 8760 hours.  CBG: No results for input(s): GLUCAP in the last 168 hours.     SignedLelon Frohlich  [facility] Pager: [phone] 10/20/2015, 11:37 AM

## 2015-10-22 ENCOUNTER — Encounter (HOSPITAL_BASED_OUTPATIENT_CLINIC_OR_DEPARTMENT_OTHER): Payer: BLUE CROSS/BLUE SHIELD

## 2015-10-22 ENCOUNTER — Inpatient Hospital Stay (HOSPITAL_COMMUNITY): Payer: Self-pay

## 2015-10-22 ENCOUNTER — Encounter (HOSPITAL_COMMUNITY): Payer: Self-pay | Admitting: Hematology & Oncology

## 2015-10-22 ENCOUNTER — Encounter (HOSPITAL_BASED_OUTPATIENT_CLINIC_OR_DEPARTMENT_OTHER): Payer: BLUE CROSS/BLUE SHIELD | Admitting: Hematology & Oncology

## 2015-10-22 VITALS — BP 106/72 | HR 93 | Temp 97.8°F | Resp 20 | Wt 161.4 lb

## 2015-10-22 VITALS — BP 140/84 | HR 81 | Temp 97.8°F | Resp 18

## 2015-10-22 DIAGNOSIS — R112 Nausea with vomiting, unspecified: Secondary | ICD-10-CM | POA: Diagnosis present

## 2015-10-22 DIAGNOSIS — C090 Malignant neoplasm of tonsillar fossa: Secondary | ICD-10-CM

## 2015-10-22 DIAGNOSIS — B029 Zoster without complications: Secondary | ICD-10-CM

## 2015-10-22 DIAGNOSIS — B3781 Candidal esophagitis: Secondary | ICD-10-CM

## 2015-10-22 DIAGNOSIS — B370 Candidal stomatitis: Secondary | ICD-10-CM | POA: Diagnosis present

## 2015-10-22 DIAGNOSIS — R05 Cough: Secondary | ICD-10-CM | POA: Diagnosis present

## 2015-10-22 DIAGNOSIS — C099 Malignant neoplasm of tonsil, unspecified: Secondary | ICD-10-CM | POA: Diagnosis present

## 2015-10-22 DIAGNOSIS — L739 Follicular disorder, unspecified: Secondary | ICD-10-CM | POA: Diagnosis present

## 2015-10-22 DIAGNOSIS — K1230 Oral mucositis (ulcerative), unspecified: Secondary | ICD-10-CM

## 2015-10-22 DIAGNOSIS — Z95828 Presence of other vascular implants and grafts: Secondary | ICD-10-CM | POA: Diagnosis present

## 2015-10-22 LAB — CULTURE, BLOOD (ROUTINE X 2)
Culture: NO GROWTH
Culture: NO GROWTH

## 2015-10-22 LAB — COMPREHENSIVE METABOLIC PANEL
ALT: 13 U/L — ABNORMAL LOW (ref 17–63)
ANION GAP: 7 (ref 5–15)
AST: 16 U/L (ref 15–41)
Albumin: 3.2 g/dL — ABNORMAL LOW (ref 3.5–5.0)
Alkaline Phosphatase: 36 U/L — ABNORMAL LOW (ref 38–126)
BILIRUBIN TOTAL: 0.3 mg/dL (ref 0.3–1.2)
BUN: 14 mg/dL (ref 6–20)
CO2: 28 mmol/L (ref 22–32)
Calcium: 9 mg/dL (ref 8.9–10.3)
Chloride: 100 mmol/L — ABNORMAL LOW (ref 101–111)
Creatinine, Ser: 0.85 mg/dL (ref 0.61–1.24)
Glucose, Bld: 120 mg/dL — ABNORMAL HIGH (ref 65–99)
POTASSIUM: 3.7 mmol/L (ref 3.5–5.1)
Sodium: 135 mmol/L (ref 135–145)
TOTAL PROTEIN: 6.6 g/dL (ref 6.5–8.1)

## 2015-10-22 MED ORDER — SODIUM CHLORIDE 0.9 % IV SOLN
Freq: Once | INTRAVENOUS | Status: AC
  Administered 2015-10-22: 10:00:00 via INTRAVENOUS

## 2015-10-22 MED ORDER — HEPARIN SOD (PORK) LOCK FLUSH 100 UNIT/ML IV SOLN
500.0000 [IU] | Freq: Once | INTRAVENOUS | Status: AC
  Administered 2015-10-22: 500 [IU] via INTRAVENOUS

## 2015-10-22 MED ORDER — NICOTINE 7 MG/24HR TD PT24
7.0000 mg | MEDICATED_PATCH | Freq: Every day | TRANSDERMAL | Status: DC
Start: 2015-10-22 — End: 2015-12-17

## 2015-10-22 NOTE — Progress Notes (Signed)
Patient tolerated infusion well.  VSS.   

## 2015-10-22 NOTE — Progress Notes (Signed)
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 Frank Castillo is seen at [facility], Hepatitis C negative CT neck with contrast 06/25/2015 at [place] level 2 malignant LAD, largest node is 3.5 cm long axis. R worse than L carotid atherosclerosis and stenosis. CT chest with no acute or significant findings within the chest    Cancer of tonsillar fossa ([place])   08/02/2015 Initial Diagnosis Cancer of tonsillar fossa ([place])   08/20/2015 Procedure Port-a-cath placed by IR   08/20/2015 Procedure G-tube placed by IR   08/21/2015 - 10/09/2015 Radiation Therapy 70 Gy, Dr. Isidore Moos.   08/21/2015 - 09/10/2015 Chemotherapy Cisplatin every 21 days with XRT.   08/28/2015 Adverse Reaction Tinnitis, Cisplatin-induced.   09/11/2015 - 09/30/2015 Chemotherapy Carboplatin/5FU   09/20/2015 - 09/21/2015 Hospital Admission Intractable nausea/vomiting. Severe Mucositis    HISTORY OF PRESENTING ILLNESS:  Frank Castillo 65 y.o. male is here for further follow-up of Stage IVA T2N2bM0 squamous cell Ca of the L tonsil. Left tonsillectomy performed on 07/09/2015 with Dr. Benjamine Mola. Frank Castillo has been discharged from the inpatient service and doing markedly better.   Frank Castillo is accompanied by his sister and presents in treatment chair.  Asked if Frank Castillo can begin to take food by mouth again. Frank Castillo has been drinking ginger ale. Frank Castillo has begun to have hunger pains.   Reports Frank Castillo feels tired and weak, however still improved. "I feel human again".   Shingles rash is improved, denies pain. Notes the lesions are dry. No new areas of rash noted.  Frank Castillo continues to experience some soreness in the top of his mouth. His sputum production is  much improved. Denies nausea. Minimal pain with swallowing.  No additional fever.  MEDICAL HISTORY:  Past Medical History  Diagnosis Date  . Tonsillar mass     left  . Portal hypertension ([place])   . Hypertension   . Cirrhosis ([place])     SURGICAL HISTORY: Past Surgical History  Procedure Laterality Date  . Hernia repair Right   . Mouth surgery    . Tonsillectomy Left 07/09/2015    Procedure: LEFT TONSILLECTOMY;  Surgeon: Leta Baptist, MD;  Location: [facility];  Service: ENT;  Laterality: Left;    SOCIAL HISTORY: Social History   Social History  . Marital Status: Single    Spouse Name: N/A  . Number of Children: 0  . Years of Education: N/A   Occupational History  . Not on file.   Social History Main Topics  . Smoking status: Current Every Day Smoker -- 0.50 packs/day    Types: Cigarettes  . Smokeless tobacco: Never Used  . Alcohol Use: No     Comment: former abuse- quit 09/2011  . Drug Use: No  . Sexual Activity: Not on file   Other Topics Concern  . Not on file   Social History Narrative   Divorced.   No children.   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   Currently smoking approximately one pack per day.   Has been smoking off/on since age 61.   Divorced No children. Just step-children. 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 Previously worked in [employer]. Originally from [place], [place]. Current smoker at 1 ppd. Began smoking  at 73-17 yo, regularly smoking at 1-21 yo. During his "cocaine years" Frank Castillo smoked about 2 ppd. ETOH, recovering alcoholic. 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 Frank Castillo enjoys reading. Frank Castillo is his mother's caregiver.  FAMILY HISTORY: Family History  Problem Relation Age of Onset  . Macular degeneration Mother   . Alcoholism Father    indicated that his mother is alive. Frank Castillo indicated that his father is deceased.   Mother still living at 36 years-old, with macular degeneration. She has no  other health issues. Father deceased. Parents divorced at 21 years-old, so Frank Castillo is not sure what Frank Castillo died from. Father was an alcoholic. 4 sisters. 1 sister died of suicide. 1 sister was adopted. 1 Sister lives in [place]. 1 Sister lives in [place].  ALLERGIES:  is allergic to bee venom.  MEDICATIONS:  Current Outpatient Prescriptions  Medication Sig Dispense Refill  . acyclovir (ZOVIRAX) 200 MG/5ML suspension Take 5 mLs (200 mg total) by mouth 5 (five) times daily. For 7 days 175 mL 0  . CARBOPLATIN IV Inject into the vein. Once every 21 days x 3 cycles    . cetirizine (ZYRTEC) 10 MG tablet Take 10 mg by mouth daily.    . cholecalciferol (VITAMIN D) 1000 units tablet Take 1,000 Units by mouth daily.    . clindamycin (CLINDAGEL) 1 % gel Apply topically 2 (two) times daily. 60 g 0  . Diphenhyd-Hydrocort-Nystatin (FIRST-DUKES MOUTHWASH) SUSP Use as directed 5 mLs in the mouth or throat 4 (four) times daily -  before meals and at bedtime. 240 mL 1  . fentaNYL (DURAGESIC - DOSED MCG/HR) 50 MCG/HR Place 1 patch (50 mcg total) onto the skin every 3 (three) days. 10 patch 0  . fluconazole (DIFLUCAN) 40 MG/ML suspension Place 2.5 mLs (100 mg total) into feeding tube daily. For 7 days 20 mL 0  . lidocaine (XYLOCAINE) 2 % solution Use as directed 20 mLs in the mouth or throat every 4 (four) hours as needed for mouth pain.   4  . lidocaine-prilocaine (EMLA) cream Apply a quarter size amount to port site 1 hour prior to chemo. Do not rub in. Cover with plastic wrap. 30 g 3  . lisinopril-hydrochlorothiazide (PRINZIDE,ZESTORETIC) 20-25 MG tablet Take 1 tablet by mouth daily. Reported on 09/25/2015  11  . LORazepam (ATIVAN) 0.5 MG tablet Take 1 tablet (0.5 mg total) by mouth every 4 (four) hours. 30 tablet 1  . Melatonin 10 MG TABS Take 1 tablet by mouth daily as needed (sleep). Reported on 09/25/2015    . metoCLOPramide (REGLAN) 5 MG tablet Take 1 tablet (5 mg total) by mouth 4 (four) times daily -  before meals and  at bedtime. 240 tablet 1  . Nutritional Supplements (JEVITY 1.2 CAL/FIBER) LIQD 8 cans/day.  4 feeding of 2 cans each. Flush with 100 mls before and after each feeding 1000 mL 6  . nystatin (MYCOSTATIN) 100000 UNIT/ML suspension Take 5 mLs (500,000 Units total) by mouth 4 (four) times daily. 120 mL 1  . ondansetron (ZOFRAN ODT) 8 MG disintegrating tablet Take 1 tablet (8 mg total) by mouth every 8 (eight) hours as needed for nausea or vomiting. 30 tablet 2  . oxyCODONE (ROXICODONE) 5 MG/5ML solution Take 5-10 mLs (5-10 mg total) by mouth every 4 (four) hours as needed for severe pain. 250 mL 0  . pantoprazole (PROTONIX) 40 MG tablet TAKE 1 TABLET (40 MG TOTAL) BY MOUTH DAILY. 30 tablet 3  . silver sulfADIAZINE (SILVADENE) 1 % cream  Apply 1 application topically 3 (three) times daily.   4  . sodium fluoride (FLUORISHIELD) 1.1 % GEL dental gel Instill one drop of gel per tooth space of fluoride tray. Place over teeth for 5 minutes. Remove. Spit out excess. Repeat nightly. 120 mL prn   No current facility-administered medications for this visit.   Facility-Administered Medications Ordered in Other Visits  Medication Dose Route Frequency Provider Last Rate Last Dose  . 0.9 %  sodium chloride infusion   Intravenous Once [facility], PA-C      . heparin lock flush 100 unit/mL  500 Units Intravenous Once [facility], PA-C        Review of Systems  Constitutional: Positive for malaise/fatigue. Negative for fever, chills.  HENT: Positive for mouth pain, and sore throat. Negative for congestion, hearing loss and nosebleeds.   Eyes: Negative.  Negative for blurred vision, double vision, pain and discharge.  Respiratory: Positive for sputum production, less thick  Negative for cough, hemoptysis, shortness of breath and wheezing.   Has thick secretions (improved).  Cardiovascular: Negative.  Negative for chest pain, palpitations, claudication, leg swelling and PND.  Gastrointestinal: Negative  for heartburn, vomiting, abdominal pain, blood in stool and melena.  Genitourinary: Negative.  Negative for dysuria, urgency, frequency and hematuria.  Musculoskeletal: Negative.  Negative for myalgias, joint pain and falls.  Skin: Positive for rash and itching.   Right arm resolving shingles  Neurological: Negative.  Negative for dizziness, tingling, tremors, sensory change, speech change, focal weakness, seizures, loss of consciousness, weakness and headaches.  Endo/Heme/Allergies: Negative.  Does not bruise/bleed easily.  Psychiatric/Behavioral: Positive for insomnia.  Negative for depression, suicidal ideas, memory loss and substance abuse. The patient is not nervous/anxious. All other systems reviewed and are negative. 14 point ROS was done and is otherwise as detailed above or in HPI   PHYSICAL EXAMINATION: ECOG PERFORMANCE STATUS: 1 - Symptomatic but completely ambulatory   Vitals with BMI 10/22/2015  Height   Weight 161 lbs 6 oz  BMI   Systolic 355  Diastolic 72  Pulse 93  Respirations 20    Physical Exam  Constitutional: Frank Castillo is oriented to person, place, and time  and in no distress.  HENT:  Head: Normocephalic and atraumatic.  Nose: Nose normal.  Mouth/Throat: Mucositis noted but markedly improved, sides of tongue with improving ulcerations. No oral thrush Eyes: Conjunctivae and EOM are normal. Pupils are equal, round, and reactive to light. Right eye exhibits no discharge. Left eye exhibits no discharge. No scleral icterus.  Neck: Normal range of motion. Neck supple. No tracheal deviation present. No thyromegaly present.  Significant bilateral XRT changes. Skin is well taken care of Cardiovascular: Normal rate, regular rhythm and normal heart sounds.  Exam reveals no gallop and no friction rub.  Port site is C/D/I no erythema or bruising No murmur heard.  Pulmonary/Chest: Effort normal and breath sounds normal. Frank Castillo has no wheezes. Frank Castillo has no rales.  Abdominal: Soft. Bowel  sounds are normal. Frank Castillo exhibits no distension and no mass. There is no tenderness. There is no rebound and no guarding. FT site is C/D/I, no erythema Musculoskeletal: Normal range of motion. Frank Castillo exhibits no edema.  Neurological: Frank Castillo is alert and oriented to person, place, and time. Frank Castillo has normal reflexes. No cranial nerve deficit. Gait normal. Coordination normal.  Skin: Skin is warm and dry. Obvious evidence of shingles on right upper extremity and right neck, dried healing.  Psychiatric: Mood, memory, affect and judgment normal.  Nursing note and vitals reviewed.    LABORATORY DATA:  I have reviewed the data as listed Results for Frank Castillo, Frank Castillo (MRN [id number]) as of 10/22/2015 10:34  Results for Frank Castillo, Frank Castillo (MRN [id number]) as of 10/22/2015 13:05  Ref. Range 10/22/2015 09:40  Sodium Latest Ref Range: 135-145 mmol/L 135  Potassium Latest Ref Range: 3.5-5.1 mmol/L 3.7  Chloride Latest Ref Range: 101-111 mmol/L 100 (L)  CO2 Latest Ref Range: 22-32 mmol/L 28  BUN Latest Ref Range: 6-20 mg/dL 14  Creatinine Latest Ref Range: 0.61-1.24 mg/dL 0.85  Calcium Latest Ref Range: 8.9-10.3 mg/dL 9.0  EGFR (Non-African Amer.) Latest Ref Range: >60 mL/min >60  EGFR (African American) Latest Ref Range: >60 mL/min >60  Glucose Latest Ref Range: 65-99 mg/dL 120 (H)  Anion gap Latest Ref Range: 5-15  7  Alkaline Phosphatase Latest Ref Range: 38-126 U/L 36 (L)  Albumin Latest Ref Range: 3.5-5.0 g/dL 3.2 (L)  AST Latest Ref Range: 15-41 U/L 16  ALT Latest Ref Range: 17-63 U/L 13 (L)  Total Protein Latest Ref Range: 6.5-8.1 g/dL 6.6  Total Bilirubin Latest Ref Range: 0.3-1.2 mg/dL 0.3    RADIOGRAPHIC STUDIES: I have personally reviewed the radiological reports as listed.  Study Result     CLINICAL DATA: Initial treatment strategy for primary tonsillar squamous cell carcinoma. Left-sided lung tonsillectomy.  EXAM: NUCLEAR MEDICINE PET SKULL BASE TO THIGH  TECHNIQUE: 8.8 mCi F-18 FDG was  injected intravenously. Full-ring PET imaging was performed from the skull base to thigh after the radiotracer. CT data was obtained and used for attenuation correction and anatomic localization.  FASTING BLOOD GLUCOSE: Value: 116 mg/dl  COMPARISON: Clinic note of 07/24/2015. Neck and chest CTs of 06/25/2015.  FINDINGS: NECK  No tonsilar hypermetabolism.  Hypermetabolic left-sided level 2 nodes. Index node measures 2.1 cm and a S.U.V. max of 17.9 on image 25/series 4. 1.9 cm on 06/25/2015.  Immediately inferior node measures 9 mm and a S.U.V. max of 3.8 on image 30/series 4.  CHEST  No areas of abnormal hypermetabolism.  ABDOMEN/PELVIS  No areas of abnormal hypermetabolism.  SKELETON  No abnormal marrow activity.  CT IMAGES PERFORMED FOR ATTENUATION CORRECTION  carotid atherosclerosis. Mild cardiomegaly with multivessel coronary artery atherosclerosis. Mild centrilobular emphysema. Mild bibasilar atelectasis. Normal adrenal glands. Gallstones and a contracted gallbladder. Moderate cirrhosis. Advanced abdominal aortic atherosclerosis. Scattered colonic diverticula. Mild prostatomegaly. Fat containing left inguinal hernia. Prior right inguinal hernia repair.  IMPRESSION: 1. Hypermetabolic left-sided level 2 adenopathy, consistent with nodal metastasis. 2. No extracervical metastatic disease identified. 3. Cirrhosis. 4. Atherosclerosis, including within the coronary arteries. 5. Cholelithiasis.   Electronically Signed  By: Abigail Miyamoto M.D.  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 Frank Castillo is seen at [facility], Hepatitis C negative CT neck with contrast 06/25/2015 at [place] level 2 malignant LAD, largest node is 3.5 cm long axis. R worse than L carotid atherosclerosis and stenosis. CT chest with  no acute or significant findings within the chest [facility] stay secondary to intractable n/v and mucositis Oropharyngeal candidiasis/esophageal candidiasis Odynophagia Fever Shingles Chemotherapy induced pancytopenia  Frank Castillo is much improved. Frank Castillo is to continue and complete his course of diflucan and acyclovir. I have written him for nicotine patches 7 mg. Frank Castillo has quit smoking.   I encouraged his oral intake. Frank Castillo has an upcoming appointment with speech therapy.  Frank Castillo will return  for follow up in 2 weeks with repeat labs and physical exam.  Orders Placed This Encounter  Procedures  . CBC with Differential    Standing Status: Future     Number of Occurrences:      Standing Expiration Date: 10/21/2016  . Comprehensive metabolic panel    Standing Status: Future     Number of Occurrences:      Standing Expiration Date: 10/21/2016   All questions were answered. The patient knows to call the clinic with any problems, questions or concerns.  This document serves as a record of services personally performed by Ancil Linsey, MD. It was created on her behalf by Arlyce Harman, a trained medical scribe. The creation of this record is based on the scribe's personal observations and the provider's statements to them. This document has been checked and approved by the attending provider.  I have reviewed the above documentation for accuracy and completeness, and I agree with the above.  This note was electronically signed.    Molli Hazard, MD  10/22/2015 10:12 AM

## 2015-10-22 NOTE — Patient Instructions (Addendum)
[place] at [facility] Discharge Instructions  RECOMMENDATIONS MADE BY THE CONSULTANT AND ANY TEST RESULTS WILL BE SENT TO YOUR REFERRING PHYSICIAN.  Return to see Gershon Mussel in 2 weeks with labs the same day     Thank you for choosing [place] at [facility] to provide your oncology and hematology care.  To afford each patient quality time with our provider, please arrive at least 15 minutes before your scheduled appointment time.   Beginning January 23rd 2017 lab work for the [facility] will be done in the  Main lab at [facility] on 1st floor. If you have a lab appointment with the [facility] please come in thru the  Main Entrance and check in at the main information desk  You need to re-schedule your appointment should you arrive 10 or more minutes late.  We strive to give you quality time with our providers, and arriving late affects you and other patients whose appointments are after yours.  Also, if you no show three or more times for appointments you may be dismissed from the clinic at the providers discretion.     Again, thank you for choosing [facility].  Our hope is that these requests will decrease the amount of time that you wait before being seen by our physicians.       _____________________________________________________________  Should you have questions after your visit to [facility], please contact our office at [phone] between the hours of 8:30 a.m. and 4:30 p.m.  Voicemails left after 4:30 p.m. will not be returned until the following business day.  For prescription refill requests, have your pharmacy contact our office.         Resources For Cancer Patients and their Caregivers ? American Cancer Society: Can assist with transportation, wigs, general needs, runs Look Good Feel Better.        405 167 7801 ? Cancer Care: Provides financial assistance, online support groups,  medication/co-pay assistance.  1-800-813-HOPE 312-403-6936) ? [facility] Assists [place] cancer patients and their families through emotional , educational and financial support.  561-674-8436 ? [place] DSS Where to apply for food stamps, Medicaid and utility assistance. 510-101-0972 ? RCATS: Transportation to medical appointments. 830 056 0020 ? Social Security Administration: May apply for disability if have a Stage IV cancer. (434)486-2477 224-135-7223 ? [employer], Disability and Transit Services: Assists with nutrition, care and transit needs. [place] Support Programs: @10RELATIVEDAYS @ > Cancer Support Group  2nd Tuesday of the month 1pm-2pm, Journey Room  > Creative Journey  3rd Tuesday of the month 1130am-1pm, Journey Room  > Look Good Feel Better  1st Wednesday of the month 10am-12 noon, Journey Room (Call [facility] to register [phone])

## 2015-10-22 NOTE — Patient Instructions (Signed)
[facility] at [facility] Discharge Instructions  RECOMMENDATIONS MADE BY THE CONSULTANT AND ANY TEST RESULTS WILL BE SENT TO YOUR REFERRING PHYSICIAN.  IV fluids today.    Thank you for choosing [facility] at [facility] to provide your oncology and hematology care.  To afford each patient quality time with our provider, please arrive at least 15 minutes before your scheduled appointment time.   Beginning January 23rd 2017 lab work for the Cancer Center will be done in the  Main lab at [facility] on 1st floor. If you have a lab appointment with the Cancer Center please come in thru the  Main Entrance and check in at the main information desk  You need to re-schedule your appointment should you arrive 10 or more minutes late.  We strive to give you quality time with our providers, and arriving late affects you and other patients whose appointments are after yours.  Also, if you no show three or more times for appointments you may be dismissed from the clinic at the providers discretion.     Again, thank you for choosing [facility].  Our hope is that these requests will decrease the amount of time that you wait before being seen by our physicians.       _____________________________________________________________  Should you have questions after your visit to [facility], please contact our office at [phone] between the hours of 8:30 a.m. and 4:30 p.m.  Voicemails left after 4:30 p.m. will not be returned until the following business day.  For prescription refill requests, have your pharmacy contact our office.         Resources For Cancer Patients and their Caregivers ? American Cancer Society: Can assist with transportation, wigs, general needs, runs Look Good Feel Better.        1-888-227-6333 ? Cancer Care: Provides financial assistance, online support groups, medication/co-pay assistance.   1-800-813-HOPE (4673) ? [facility] Assists [place] cancer patients and their families through emotional , educational and financial support.  336-427-4357 ? [place] DSS Where to apply for food stamps, Medicaid and utility assistance. 336-342-1394 ? RCATS: Transportation to medical appointments. 336-347-2287 ? Social Security Administration: May apply for disability if have a Stage IV cancer. 336-342-7796 1-800-772-1213 ? [place] Aging, Disability and Transit Services: Assists with nutrition, care and transit needs. 336-349-2343  Cancer Center Support Programs: @10RELATIVEDAYS@ > Cancer Support Group  2nd Tuesday of the month 1pm-2pm, Journey Room  > Creative Journey  3rd Tuesday of the month 1130am-1pm, Journey Room  > Look Good Feel Better  1st Wednesday of the month 10am-12 noon, Journey Room (Call American Cancer Society to register 1-800-395-5775)    

## 2015-10-25 ENCOUNTER — Other Ambulatory Visit (HOSPITAL_COMMUNITY): Payer: Self-pay | Admitting: *Deleted

## 2015-10-25 ENCOUNTER — Encounter (HOSPITAL_COMMUNITY): Payer: BLUE CROSS/BLUE SHIELD | Attending: Hematology & Oncology

## 2015-10-25 ENCOUNTER — Encounter (HOSPITAL_COMMUNITY): Payer: Self-pay

## 2015-10-25 VITALS — BP 132/90 | HR 80 | Temp 97.9°F | Resp 18

## 2015-10-25 DIAGNOSIS — C099 Malignant neoplasm of tonsil, unspecified: Secondary | ICD-10-CM | POA: Diagnosis present

## 2015-10-25 DIAGNOSIS — C090 Malignant neoplasm of tonsillar fossa: Secondary | ICD-10-CM

## 2015-10-25 DIAGNOSIS — R05 Cough: Secondary | ICD-10-CM | POA: Insufficient documentation

## 2015-10-25 DIAGNOSIS — Z95828 Presence of other vascular implants and grafts: Secondary | ICD-10-CM | POA: Insufficient documentation

## 2015-10-25 DIAGNOSIS — R112 Nausea with vomiting, unspecified: Secondary | ICD-10-CM | POA: Insufficient documentation

## 2015-10-25 DIAGNOSIS — B370 Candidal stomatitis: Secondary | ICD-10-CM | POA: Insufficient documentation

## 2015-10-25 DIAGNOSIS — R300 Dysuria: Secondary | ICD-10-CM | POA: Insufficient documentation

## 2015-10-25 DIAGNOSIS — L739 Follicular disorder, unspecified: Secondary | ICD-10-CM | POA: Diagnosis present

## 2015-10-25 DIAGNOSIS — R638 Other symptoms and signs concerning food and fluid intake: Secondary | ICD-10-CM

## 2015-10-25 LAB — TYPE AND SCREEN
ABO/RH(D): A POS
ANTIBODY SCREEN: NEGATIVE
UNIT DIVISION: 0
Unit division: 0

## 2015-10-25 LAB — URINALYSIS, ROUTINE W REFLEX MICROSCOPIC
Bilirubin Urine: NEGATIVE
GLUCOSE, UA: NEGATIVE mg/dL
HGB URINE DIPSTICK: NEGATIVE
KETONES UR: NEGATIVE mg/dL
LEUKOCYTES UA: NEGATIVE
Nitrite: NEGATIVE
PH: 7 (ref 5.0–8.0)
Protein, ur: NEGATIVE mg/dL
Specific Gravity, Urine: <1.005 — ABNORMAL LOW (ref 1.005–1.030)

## 2015-10-25 MED ORDER — SODIUM CHLORIDE 0.9 % IV SOLN: INTRAVENOUS | Status: DC

## 2015-10-25 MED ORDER — SODIUM CHLORIDE 0.9 % IV SOLN
INTRAVENOUS | Status: AC
  Administered 2016-03-28 (×2): via INTRAVENOUS

## 2015-10-25 MED ORDER — SODIUM CHLORIDE 0.9 % IV SOLN: INTRAVENOUS | Status: AC

## 2015-10-25 MED ORDER — SODIUM CHLORIDE 0.9 % IV SOLN
Freq: Once | INTRAVENOUS | Status: AC
  Administered 2016-03-28 (×2): via INTRAVENOUS

## 2015-10-25 MED ORDER — HEPARIN SOD (PORK) LOCK FLUSH 100 UNIT/ML IV SOLN
INTRAVENOUS | Status: AC
  Filled 2015-10-25: qty 5

## 2015-10-25 MED ORDER — SODIUM CHLORIDE 0.9 % IV SOLN
INTRAVENOUS | Status: DC
  Administered 2015-10-25: 10:00:00 via INTRAVENOUS

## 2015-10-25 MED ORDER — HEPARIN SOD (PORK) LOCK FLUSH 100 UNIT/ML IV SOLN
500.0000 [IU] | Freq: Once | INTRAVENOUS | Status: AC
  Administered 2015-10-25: 500 [IU] via INTRAVENOUS

## 2015-10-25 NOTE — Patient Instructions (Signed)
[facility] at [facility] Discharge Instructions  RECOMMENDATIONS MADE BY THE CONSULTANT AND ANY TEST RESULTS WILL BE SENT TO YOUR REFERRING PHYSICIAN.  IV fluids today Follow up as scheduled Please call the clinic if you have any questions or concerns     Thank you for choosing  [facility] at [facility] to provide your oncology and hematology care.  To afford each patient quality time with our provider, please arrive at least 15 minutes before your scheduled appointment time.   Beginning January 23rd 2017 lab work for the Cancer Center will be done in the  Main lab at [place] on 1st floor. If you have a lab appointment with the Cancer Center please come in thru the  Main Entrance and check in at the main information desk  You need to re-schedule your appointment should you arrive 10 or more minutes late.  We strive to give you quality time with our providers, and arriving late affects you and other patients whose appointments are after yours.  Also, if you no show three or more times for appointments you may be dismissed from the clinic at the providers discretion.     Again, thank you for choosing [facility].  Our hope is that these requests will decrease the amount of time that you wait before being seen by our physicians.       _____________________________________________________________  Should you have questions after your visit to [facility], please contact our office at [phone] between the hours of 8:30 a.m. and 4:30 p.m.  Voicemails left after 4:30 p.m. will not be returned until the following business day.  For prescription refill requests, have your pharmacy contact our office.         Resources For Cancer Patients and their Caregivers ? American Cancer Society: Can assist with transportation, wigs, general needs, runs Look Good Feel Better.        1-888-227-6333 ? Cancer Care: Provides  financial assistance, online support groups, medication/co-pay assistance.  1-800-813-HOPE (4673) ? [facility] Assists [place] cancer patients and their families through emotional , educational and financial support.  336-427-4357 ? [place] DSS Where to apply for food stamps, Medicaid and utility assistance. 336-342-1394 ? RCATS: Transportation to medical appointments. 336-347-2287 ? Social Security Administration: May apply for disability if have a Stage IV cancer. 336-342-7796 1-800-772-1213 ? [place] Aging, Disability and Transit Services: Assists with nutrition, care and transit needs. 336-349-2343  Cancer Center Support Programs: @10RELATIVEDAYS@ > Cancer Support Group  2nd Tuesday of the month 1pm-2pm, Journey Room  > Creative Journey  3rd Tuesday of the month 1130am-1pm, Journey Room  > Look Good Feel Better  1st Wednesday of the month 10am-12 noon, Journey Room (Call American Cancer Society to register 1-800-395-5775)    

## 2015-10-25 NOTE — Progress Notes (Signed)
Frank Castillo Tolerated fluids today Discharged ambulatory

## 2015-10-26 ENCOUNTER — Encounter (HOSPITAL_COMMUNITY): Payer: Self-pay

## 2015-10-28 ENCOUNTER — Telehealth (HOSPITAL_COMMUNITY): Payer: Self-pay | Admitting: *Deleted

## 2015-10-28 ENCOUNTER — Other Ambulatory Visit (HOSPITAL_COMMUNITY): Payer: Self-pay | Admitting: *Deleted

## 2015-10-28 ENCOUNTER — Other Ambulatory Visit (HOSPITAL_COMMUNITY): Payer: Self-pay | Admitting: Oncology

## 2015-10-28 ENCOUNTER — Encounter (HOSPITAL_COMMUNITY)
Admission: RE | Admit: 2015-10-28 | Discharge: 2015-10-28 | Disposition: A | Discharge status: Home / Self Care | Payer: BLUE CROSS/BLUE SHIELD | Source: Ambulatory Visit | Attending: Hematology & Oncology | Admitting: Hematology & Oncology

## 2015-10-28 DIAGNOSIS — C090 Malignant neoplasm of tonsillar fossa: Secondary | ICD-10-CM | POA: Diagnosis present

## 2015-10-28 DIAGNOSIS — R638 Other symptoms and signs concerning food and fluid intake: Secondary | ICD-10-CM | POA: Diagnosis present

## 2015-10-28 MED ORDER — HEPARIN SOD (PORK) LOCK FLUSH 100 UNIT/ML IV SOLN
INTRAVENOUS | Status: AC
  Filled 2015-10-28: qty 5

## 2015-10-28 MED ORDER — HEPARIN SOD (PORK) LOCK FLUSH 100 UNIT/ML IV SOLN
500.0000 [IU] | INTRAVENOUS | Status: AC | PRN
  Administered 2015-10-28: 500 [IU]

## 2015-10-28 MED ORDER — OXYCODONE HCL 5 MG/5ML PO SOLN: 5.0000 mg | ORAL | Status: DC | PRN

## 2015-10-28 MED ORDER — SODIUM CHLORIDE 0.9 % IV SOLN
INTRAVENOUS | Status: AC
  Administered 2015-10-28: 14:00:00 via INTRAVENOUS

## 2015-10-28 MED ORDER — SODIUM CHLORIDE 0.9 % IV SOLN: INTRAVENOUS | Status: AC

## 2015-10-28 MED ORDER — SODIUM CHLORIDE 0.9 % IV SOLN
INTRAVENOUS | Status: AC
Start: 2015-11-01 — End: ?

## 2015-10-28 NOTE — Telephone Encounter (Signed)
Rx for pain medication is printed.  For itching, he should use topical anti-itch lotion/cream.  Like Benadryl.  No topical steroids.  TK

## 2015-10-29 ENCOUNTER — Other Ambulatory Visit (HOSPITAL_COMMUNITY): Payer: Self-pay | Admitting: *Deleted

## 2015-10-29 DIAGNOSIS — R638 Other symptoms and signs concerning food and fluid intake: Secondary | ICD-10-CM

## 2015-10-29 MED ORDER — SODIUM CHLORIDE 0.9 % IV SOLN: INTRAVENOUS | Status: AC

## 2015-10-30 ENCOUNTER — Encounter (HOSPITAL_COMMUNITY)
Admission: RE | Admit: 2015-10-30 | Discharge: 2015-10-30 | Disposition: A | Discharge status: Home / Self Care | Payer: BLUE CROSS/BLUE SHIELD | Source: Ambulatory Visit | Attending: Hematology & Oncology | Admitting: Hematology & Oncology

## 2015-10-30 ENCOUNTER — Ambulatory Visit (HOSPITAL_COMMUNITY): Payer: BLUE CROSS/BLUE SHIELD | Attending: Hematology & Oncology | Admitting: Speech Pathology

## 2015-10-30 ENCOUNTER — Other Ambulatory Visit (HOSPITAL_COMMUNITY): Payer: Self-pay | Admitting: *Deleted

## 2015-10-30 DIAGNOSIS — R1312 Dysphagia, oropharyngeal phase: Secondary | ICD-10-CM | POA: Diagnosis present

## 2015-10-30 DIAGNOSIS — C090 Malignant neoplasm of tonsillar fossa: Secondary | ICD-10-CM | POA: Diagnosis not present

## 2015-10-30 MED ORDER — HEPARIN SOD (PORK) LOCK FLUSH 100 UNIT/ML IV SOLN
500.0000 [IU] | Freq: Once | INTRAVENOUS | Status: AC
  Administered 2015-10-30: 500 [IU] via INTRAVENOUS

## 2015-10-30 MED ORDER — SODIUM CHLORIDE 0.9 % IV SOLN
INTRAVENOUS | Status: DC
  Administered 2015-10-30: 500 mL/h via INTRAVENOUS

## 2015-10-30 MED ORDER — HEPARIN SOD (PORK) LOCK FLUSH 100 UNIT/ML IV SOLN
INTRAVENOUS | Status: AC
  Filled 2015-10-30: qty 5

## 2015-10-30 NOTE — Therapy (Signed)
[facility] [place], [place], [address] Phone: [phone]   Fax:  [phone]  Speech Language Pathology Treatment  Patient Details  Name: Frank Castillo MRN: [id number] Date of Birth: 07-Sep-1950 No Data Recorded  Encounter Date: 10/30/2015      End of Session - 10/30/15 1050    Visit Number 2   Number of Visits 6   Authorization Type BC[employer]   SLP Start Time 0900   SLP Stop Time  0951   SLP Time Calculation (min) 51 min   Activity Tolerance Patient tolerated treatment well      Past Medical History  Diagnosis Date  . Tonsillar mass     left  . Portal hypertension ([place])   . Hypertension   . Cirrhosis [facility])     Past Surgical History  Procedure Laterality Date  . Hernia repair Right   . Mouth surgery    . Tonsillectomy Left 07/09/2015    Procedure: LEFT TONSILLECTOMY;  Surgeon: Leta Baptist, MD;  Location: [facility];  Service: ENT;  Laterality: Left;    There were no vitals filed for this visit.      Subjective Assessment - 10/30/15 1046    Subjective "I ate some applesauce last week."   Currently in Pain? No/denies               ADULT SLP TREATMENT - 10/30/15 1047    General Information   Behavior/Cognition Alert;Cooperative;Pleasant mood   Patient Positioning Upright in chair   Oral care provided N/A   HPI Frank Castillo 65 y.o. male referred by Dr. 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 Pt with mucositis oral and oral candidiasis and treated with nystatin (MYCOSTATIN) 100000 UNIT/ML suspension. Pt will continue with IV fluids three times weekly. Left tonsillectomy 07/09/2015 and PEG placed 08/20/2015. Pt anticipates that chemo will conclude ~10/22/2015 and radiation  ~10/09/2015. Pt referred for swallowing evaluation due to risk of aspiration and malnutrition in setting of head and neck cancer treatment.    Treatment Provided   Treatment provided Dysphagia   Dysphagia Treatment   Temperature Spikes Noted No   Respiratory Status Room air   Oral Cavity - Dentition Adequate natural dentition   Treatment Methods Skilled observation;Therapeutic exercise;Compensation strategy training;Patient/caregiver education   Patient observed directly with PO's Yes   Type of PO's observed Dysphagia 1 (puree);Thin liquids   Feeding Able to feed self   Liquids provided via Cup;Straw   Oral Phase Signs & Symptoms Prolonged bolus formation   Pharyngeal Phase Signs & Symptoms Multiple swallows;Suspected delayed swallow initiation;Delayed throat clear   Type of cueing Verbal   Amount of cueing Minimal   Pain Assessment   Pain Assessment No/denies pain   Assessment / Recommendations / Plan   Plan Continue with current plan of care   Dysphagia Recommendations   Diet recommendations Dysphagia 1 (puree);Thin liquid;Dysphagia 2 (fine chop)   Liquids provided via Cup;Straw   Medication Administration Via alternative means   Supervision Patient able to self feed   Compensations Slow rate;Small sips/bites;Multiple dry swallows after each bite/sip;Follow solids with liquid   Postural Changes and/or Swallow Maneuvers Out of bed for meals;Seated upright 90 degrees;Upright 30-60 min after meal   General Recommendations   Oral Care Recommendations Patient independent with oral care;Oral care before and after  PO   Follow up Recommendations Outpatient SLP   Progression Toward Goals   Progression toward goals Progressing toward goals          SLP Education - 10/30/15 1049    Education provided Yes   Education Details foods to try: guacamole, hummus, yogurt, smoothies, canned fruits with cottage cheese with use of immersion blender, etc   Person(s) Educated Patient  and sister,  Frank Castillo   Methods Explanation;Handout;Verbal cues   Comprehension Verbalized understanding          SLP Short Term Goals - 10/30/15 1052    SLP SHORT TERM GOAL #1   Title Pt will complete swallowing exercises 3x/day 7 days a week with use of written cues and pt/caregiver report.    Baseline not completing   Time 2   Period Months   Status On-going   SLP SHORT TERM GOAL #2   Title Pt will increase oral intake of all consistencies and textures to regular/thin for 3 small meals a day to decrease dependency of PEG and facilitate safe swallow function going forward.    Baseline sipping liquids only   Time 2   Period Months   Status On-going          SLP Long Term Goals - 10/30/15 1052    SLP LONG TERM GOAL #1   Title Pt will demonstrate safe and efficient consumption of self regulated regular textures with use of compensatory strategies as needed.   Baseline Pt only drinking liquids at this time due to odynophagia   Time 2   Period Months   Status On-going          Plan - 10/30/15 1051    Clinical Impression Statement Frank Castillo was accompanied to today's appointment by his sister, Frank Castillo. Progress toward goals: 1) Pt will complete swallowing exercises 3x/day 7 days a week with use of written cues and pt/caregiver report. Pt reports completing exercises at home when able. He continues to report discomforgot in mouth. SLP provided verbal and tactile cues when completing chin tuck against resistance exercises in session. Lingual ROM adequate and pt able to complete cycles of 10. 2) Pt will increase oral intake of all consistencies and textures to regular/thin for 3 small meals a day to decrease dependency of PEG and facilitate safe swallow function going forward. Pt has been drinking liquids by mouth, but intake of foods/pureed solids has been minimal. He reports that he ate some applesauce "a few days ago". In session, he consumed 3.5 ounces of chocolate pudding with encouragement  for intake. He states that he can taste the chocolate some. No pain reported with intake. Pt and sister participated in brainstorming session regarding foods to try at home: guacamole, hummus, egg salad, use of immersion blender for cottage cheese and canned fruits, sweet potato, mashed potato, smoothies, greek yogurt, soups, etc. Pt provided with written list of foods to try and encouraged pt to attempt 4 ounces of some kind of pureed/mashed solid every day. Continue with exercises and encourage po intake. Will see pt in 2 weeks. He is still going for IV fluids 3x/week.    Speech Therapy Frequency Biweekly   Duration --  6 visits total every 2 weeks   Treatment/Interventions Aspiration precaution training;Pharyngeal strengthening exercises;Diet toleration management by SLP;SLP instruction and feedback;Compensatory strategies;Patient/family education   Potential to Achieve Goals Good   SLP Home Exercise Plan Pt will be independent with HEP as assigned to facilitate carryover of treatment strategies at home  Consulted and Agree with Plan of Care Patient      Patient will benefit from skilled therapeutic intervention in order to improve the following deficits and impairments:   Dysphagia, oropharyngeal phase    Problem List Patient Active Problem List   Diagnosis Date Noted  . Dysuria 10/25/2015  . Decreased oral intake 10/25/2015  . Malnutrition of moderate degree 10/19/2015  . Antineoplastic chemotherapy induced pancytopenia ([place]) 10/18/2015  . Fever   . Candidiasis of mouth and esophagus ([place]) 10/17/2015  . Neutropenia, febrile ([place]) 10/17/2015  . Shingles rash 10/17/2015  . Nausea with vomiting 09/20/2015  . Hyponatremia 09/20/2015  . Oral pharyngeal candidiasis 09/20/2015  . Antineoplastic chemotherapy induced anemia 09/20/2015  . Hypertension   . Cirrhosis ([place])   . Portal hypertension ([place])   . Cancer of tonsillar fossa [facility]) 08/02/2015   Thank you,  Genene Churn,  [place]  [facility] 10/30/2015, 10:53 AM  [place] [facility] [place] [address] [place], [place], [address] Phone: [phone]   Fax:  [phone]   Name: Frank Castillo MRN: [id number] Date of Birth: Jun 07, 1950

## 2015-11-01 ENCOUNTER — Encounter (HOSPITAL_COMMUNITY)
Admission: RE | Admit: 2015-11-01 | Discharge: 2015-11-01 | Disposition: A | Discharge status: Home / Self Care | Payer: BLUE CROSS/BLUE SHIELD | Source: Ambulatory Visit | Attending: Hematology & Oncology | Admitting: Hematology & Oncology

## 2015-11-01 DIAGNOSIS — C090 Malignant neoplasm of tonsillar fossa: Secondary | ICD-10-CM | POA: Diagnosis not present

## 2015-11-01 MED ORDER — HEPARIN SOD (PORK) LOCK FLUSH 100 UNIT/ML IV SOLN
500.0000 [IU] | INTRAVENOUS | Status: AC | PRN
  Administered 2015-11-01: 500 [IU]

## 2015-11-01 MED ORDER — SODIUM CHLORIDE 0.9 % IV SOLN
INTRAVENOUS | Status: DC
  Administered 2015-11-01: 10:00:00 via INTRAVENOUS

## 2015-11-01 MED ORDER — HEPARIN SOD (PORK) LOCK FLUSH 100 UNIT/ML IV SOLN
INTRAVENOUS | Status: AC
  Filled 2015-11-01: qty 5

## 2015-11-05 ENCOUNTER — Encounter (HOSPITAL_COMMUNITY): Payer: Self-pay | Admitting: Oncology

## 2015-11-05 ENCOUNTER — Encounter (HOSPITAL_BASED_OUTPATIENT_CLINIC_OR_DEPARTMENT_OTHER): Payer: BLUE CROSS/BLUE SHIELD | Admitting: Oncology

## 2015-11-05 ENCOUNTER — Encounter (HOSPITAL_COMMUNITY): Payer: BLUE CROSS/BLUE SHIELD

## 2015-11-05 VITALS — BP 138/81 | HR 86 | Temp 97.7°F | Resp 18 | Wt 162.2 lb

## 2015-11-05 DIAGNOSIS — C090 Malignant neoplasm of tonsillar fossa: Secondary | ICD-10-CM

## 2015-11-05 DIAGNOSIS — C099 Malignant neoplasm of tonsil, unspecified: Secondary | ICD-10-CM | POA: Diagnosis not present

## 2015-11-05 LAB — CBC WITH DIFFERENTIAL/PLATELET
BASOS PCT: 1 %
Basophils Absolute: 0 10*3/uL (ref 0.0–0.1)
EOS ABS: 0.1 10*3/uL (ref 0.0–0.7)
Eosinophils Relative: 2 %
HCT: 30 % — ABNORMAL LOW (ref 39.0–52.0)
Hemoglobin: 10.1 g/dL — ABNORMAL LOW (ref 13.0–17.0)
LYMPHS ABS: 0.6 10*3/uL — AB (ref 0.7–4.0)
Lymphocytes Relative: 13 %
MCH: 32.1 pg (ref 26.0–34.0)
MCHC: 33.7 g/dL (ref 30.0–36.0)
MCV: 95.2 fL (ref 78.0–100.0)
MONO ABS: 0.6 10*3/uL (ref 0.1–1.0)
MONOS PCT: 14 %
Neutro Abs: 3 10*3/uL (ref 1.7–7.7)
Neutrophils Relative %: 70 %
Platelets: 295 10*3/uL (ref 150–400)
RBC: 3.15 MIL/uL — ABNORMAL LOW (ref 4.22–5.81)
RDW: 19.1 % — AB (ref 11.5–15.5)
WBC: 4.3 10*3/uL (ref 4.0–10.5)

## 2015-11-05 LAB — COMPREHENSIVE METABOLIC PANEL
ALK PHOS: 40 U/L (ref 38–126)
ALT: 9 U/L — ABNORMAL LOW (ref 17–63)
ANION GAP: 7 (ref 5–15)
AST: 13 U/L — ABNORMAL LOW (ref 15–41)
Albumin: 3.5 g/dL (ref 3.5–5.0)
BILIRUBIN TOTAL: 0.3 mg/dL (ref 0.3–1.2)
BUN: 15 mg/dL (ref 6–20)
CALCIUM: 8.6 mg/dL — AB (ref 8.9–10.3)
CO2: 27 mmol/L (ref 22–32)
Chloride: 99 mmol/L — ABNORMAL LOW (ref 101–111)
Creatinine, Ser: 0.7 mg/dL (ref 0.61–1.24)
GFR calc non Af Amer: >60 mL/min (ref >60)
Glucose, Bld: 123 mg/dL — ABNORMAL HIGH (ref 65–99)
POTASSIUM: 4 mmol/L (ref 3.5–5.1)
Sodium: 133 mmol/L — ABNORMAL LOW (ref 135–145)
TOTAL PROTEIN: 6.3 g/dL — AB (ref 6.5–8.1)

## 2015-11-05 MED ORDER — FENTANYL 12 MCG/HR TD PT72: 12.5000 ug | MEDICATED_PATCH | TRANSDERMAL | Status: DC

## 2015-11-05 MED ORDER — LORAZEPAM 0.5 MG PO TABS: 0.5000 mg | ORAL_TABLET | ORAL | Status: DC

## 2015-11-05 MED ORDER — OXYCODONE HCL 5 MG/5ML PO SOLN: 5.0000 mg | ORAL | Status: DC | PRN

## 2015-11-05 NOTE — Patient Instructions (Signed)
[place] at [facility] Discharge Instructions  RECOMMENDATIONS MADE BY THE CONSULTANT AND ANY TEST RESULTS WILL BE SENT TO YOUR REFERRING PHYSICIAN.  Exam done and seen today by Kirby Crigler Smoking cessation referral made today. Can try lemon drops, Biotine for dry mouth. Gave you scripts for Ativan , oxycodone, and 12.5 fentanyl Return to see the Doctor in 3-4 weeks Call the clinic for any concerns or questions.  Fentanyl taper 50 mcg x approx 6 days 37.5 mcg x approx 12 days 60mcg x approx 12-15 days 12.5 mcg x approx 12 days stop  Thank you for choosing [place] at [facility] to provide your oncology and hematology care.  To afford each patient quality time with our provider, please arrive at least 15 minutes before your scheduled appointment time.   Beginning January 23rd 2017 lab work for the [facility] will be done in the  Main lab at [facility] on 1st floor. If you have a lab appointment with the [facility] please come in thru the  Main Entrance and check in at the main information desk  You need to re-schedule your appointment should you arrive 10 or more minutes late.  We strive to give you quality time with our providers, and arriving late affects you and other patients whose appointments are after yours.  Also, if you no show three or more times for appointments you may be dismissed from the clinic at the providers discretion.     Again, thank you for choosing [facility].  Our hope is that these requests will decrease the amount of time that you wait before being seen by our physicians.       _____________________________________________________________  Should you have questions after your visit to [facility], please contact our office at [phone] between the hours of 8:30 a.m. and 4:30 p.m.  Voicemails left after 4:30 p.m. will not be returned until the following business day.  For  prescription refill requests, have your pharmacy contact our office.         Resources For Cancer Patients and their Caregivers ? American Cancer Society: Can assist with transportation, wigs, general needs, runs Look Good Feel Better.        (256)435-8985 ? Cancer Care: Provides financial assistance, online support groups, medication/co-pay assistance.  1-800-813-HOPE (343)738-6295) ? [place] Assists [place] cancer patients and their families through emotional , educational and financial support.  681-223-6022 ? [place] DSS Where to apply for food stamps, Medicaid and utility assistance. 949-241-6958 ? [facility]: Transportation to medical appointments. (617)594-4301 ? Social Security Administration: May apply for disability if have a Stage IV cancer. 615-791-1912 (503)008-4863 ? [employer], Disability and Transit Services: Assists with nutrition, care and transit needs. [place] Support Programs: @10RELATIVEDAYS @ > Cancer Support Group  2nd Tuesday of the month 1pm-2pm, Journey Room  > Creative Journey  3rd Tuesday of the month 1130am-1pm, Journey Room  > Look Good Feel Better  1st Wednesday of the month 10am-12 noon, Journey Room (Call [place] to register [phone])

## 2015-11-05 NOTE — Assessment & Plan Note (Addendum)
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  Treatment course complicated by severe chemoradiation-induced stomatitis.  Oncology history updated.  Labs today: CBC diff, CMET.  I personally reviewed and went over laboratory results with the patient.  The results are noted within this dictation.  He is to continue to follow with speech pathology as directed.  He is educated on the importance of this.  We will discuss future baseline imaging at next follow-up appointment.  NCCN guidelines recommends new baseline imaging within 6 months of completing therapy.  In the future, as he get farther out from completion of treatment, he may be a good candidate for involvement in the survivorship clinic.  We will refer when appropriate.    He was using Nicoderm patches, but has developed contact dermatitis at the sites of these patches.  He admits that he is not yet mentally prepared to quit smoking.  I will refer him to smoking cessation class and he is provided a pamphlet for this.  He is to call the number on that pamphlet to enroll.  He remains on pain medications and he is very interested in tapering.  I initially provided him with a taper over 3 weeks but he is concerned about current pain increasing with taper of medications that quick.  As a result, we will taper fentanyl over the next 6-8 weeks:  50 mcg x 6 days  37.5 mcg x 12-15 days  25 mcg x 12-15 days  12.5 mcg x 12-15 days  STOP He is agreeable to this plan.  I have written Rx for 12.5 mcg Fentanyl to accommodate this schedule.  He still have 25 mcg patches at home. He will still have breakthrough pain medication.  He needs a refill on Ativan and Oxycodone.  Both are printed today.  He continues with  bilateral, lateral tongue ulcers.  I have asked him to follow with [facility] recommendations regarding application of wax to interior aspects of lower teeth to decrease trauma to tongue.  He notes dry mouth.  This is secondary to chemoXRT.  He is provided education regarding this with some supportive treatment/management options including: sour candies, biotene for dry mouth products.  His sister remains very active and involved in the patient's care, at time overstepping Mr. Beston wishes regarding his care.  He will return in 3-4 weeks for follow-up.

## 2015-11-05 NOTE — Progress Notes (Signed)
Frank Savage, MD [place] [address]  Cancer of tonsillar fossa [facility]) - Plan: fentaNYL (DURAGESIC - DOSED MCG/HR) 12 MCG/HR, oxyCODONE (ROXICODONE) 5 MG/5ML solution, LORazepam (ATIVAN) 0.5 MG tablet  CURRENT THERAPY: Surveillance per NCCN guidelines and supportive care.  INTERVAL HISTORY: Frank Castillo 65 y.o. 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 Treatment course complicated by severe chemoradiation-induced stomatitis.    Cancer of tonsillar fossa ([place])   08/02/2015 Initial Diagnosis Cancer of tonsillar fossa ([place])   08/20/2015 Procedure Port-a-cath placed by IR   08/20/2015 Procedure G-tube placed by IR   08/21/2015 - 10/09/2015 Radiation Therapy 70 Gy, Dr. Isidore Moos.   08/21/2015 - 09/10/2015 Chemotherapy Cisplatin every 21 days with XRT.   08/28/2015 Adverse Reaction Tinnitis, Cisplatin-induced.   09/11/2015 - 09/30/2015 Chemotherapy Carboplatin/5FU   09/20/2015 - 09/21/2015 Hospital Admission Intractable nausea/vomiting. Severe Mucositis   10/17/2015 - 10/20/2015 Hospital Admission Esophageal/oral candidasis. Shingles. Fever. Chemotherapy induced pancytopenia   Chart reviewed.  He has seen speech therapy, Frank Castillo, on 10/30/2015:  Frank Castillo was accompanied to today's appointment by his sister, Frank Castillo. Progress toward goals: 1) Pt will complete swallowing exercises 3x/day 7 days a week with use of written cues and pt/caregiver report. Pt reports completing exercises at home when able. He continues to report discomforgot in mouth. SLP provided verbal and tactile cues when completing chin tuck against resistance exercises in session. Lingual ROM adequate and pt able to complete cycles of 10. 2) Pt will  increase oral intake of all consistencies and textures to regular/thin for 3 small meals a day to decrease dependency of PEG and facilitate safe swallow function going forward. Pt has been drinking liquids by mouth, but intake of foods/pureed solids has been minimal. He reports that he ate some applesauce "a few days ago". In session, he consumed 3.5 ounces of chocolate pudding with encouragement for intake. He states that he can taste the chocolate some. No pain reported with intake. Pt and sister participated in brainstorming session regarding foods to try at home: guacamole, hummus, egg salad, use of immersion blender for cottage cheese and canned fruits, sweet potato, mashed potato, smoothies, greek yogurt, soups, etc. Pt provided with written list of foods to try and encouraged pt to attempt 4 ounces of some kind of pureed/mashed solid every day. Continue with exercises and encourage po intake. Will see pt in 2 weeks. He is still going for IV fluids 3x/week.  Overall, he is doing well.  He is progressing and recovering from treatment as expected.  He reports some bilateral tongues sores.  They are improved, but persist.    He remains on Fentanyl, but he is very interested in getting off pain medication as he wants to get back to work.  He is using Nicoderm patches for smoking cessation, but these are causing contact dermatitis and therefore, he has discontinued this.  He is interested in smoking cessation, but mentally, he is not sure that he is ready to quit smoking.  Smoking cessation education is provided.   Review of Systems  Constitutional: Negative.   HENT:       Tongue sores  Eyes: Negative.   Respiratory: Negative.   Cardiovascular: Negative.   Gastrointestinal: Negative.   Genitourinary: Negative.  Musculoskeletal: Negative.   Skin: Positive for rash (at sites of Nicoderm patches).  Neurological: Negative.   Endo/Heme/Allergies: Negative.   Psychiatric/Behavioral: Negative.      Past Medical History  Diagnosis Date  . Tonsillar mass     left  . Portal hypertension ([place])   . Hypertension   . Cirrhosis [facility])     Past Surgical History  Procedure Laterality Date  . Hernia repair Right   . Mouth surgery    . Tonsillectomy Left 07/09/2015    Procedure: LEFT TONSILLECTOMY;  Surgeon: Leta Baptist, MD;  Location: [facility];  Service: ENT;  Laterality: Left;    Family History  Problem Relation Age of Onset  . Macular degeneration Mother   . Alcoholism Father     Social History   Social History  . Marital Status: Single    Spouse Name: N/A  . Number of Children: 0  . Years of Education: N/A   Social History Main Topics  . Smoking status: Current Every Day Smoker -- 0.50 packs/day    Types: Cigarettes  . Smokeless tobacco: Never Used  . Alcohol Use: No     Comment: former abuse- quit 09/2011  . Drug Use: No  . Sexual Activity: Not Asked   Other Topics Concern  . None   Social History Narrative   Divorced.   No children.   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   Currently smoking approximately one pack per day.   Has been smoking off/on since age 42.     PHYSICAL EXAMINATION  ECOG PERFORMANCE STATUS: 1 - Symptomatic but completely ambulatory  Filed Vitals:   11/05/15 1421  BP: 138/81  Pulse: 86  Temp: 97.7 F (36.5 C)  Resp: 18    GENERAL:alert, no distress, well nourished, well developed, comfortable, cooperative, smiling and accompanied by his sister SKIN: skin color, texture, turgor are normal, no rashes or significant lesions HEAD: Normocephalic, No masses, lesions, tenderness or abnormalities EYES: normal, EOMI, Conjunctiva are pink and non-injected EARS: External ears normal OROPHARYNX: bilateral, lateral ulcers of tongue  NECK: supple, no adenopathy, trachea midline LYMPH:  no palpable lymphadenopathy BREAST:not examined LUNGS: clear to auscultation and percussion HEART: regular rate &  rhythm, no murmurs, no gallops, S1 normal and S2 normal ABDOMEN:abdomen soft and normal bowel sounds BACK: Back symmetric, no curvature. EXTREMITIES:less then 2 second capillary refill, no joint deformities, effusion, or inflammation, no skin discoloration  NEURO: alert & oriented x 3 with fluent speech, no focal motor/sensory deficits, gait normal   LABORATORY DATA: CBC    Component Value Date/Time   WBC 4.3 11/05/2015 1409   WBC 9.0 10/21/2011 1614   RBC 3.15* 11/05/2015 1409   RBC 3.42* 10/21/2011 1614   HGB 10.1* 11/05/2015 1409   HGB 11.5* 10/21/2011 1614   HCT 30.0* 11/05/2015 1409   HCT 34.6* 10/21/2011 1614   PLT 295 11/05/2015 1409   PLT 209 10/21/2011 1614   MCV 95.2 11/05/2015 1409   MCV 101* 10/21/2011 1614   MCH 32.1 11/05/2015 1409   MCH 33.7 10/21/2011 1614   MCHC 33.7 11/05/2015 1409   MCHC 33.3 10/21/2011 1614   RDW 19.1* 11/05/2015 1409   RDW 14.0 10/21/2011 1614   LYMPHSABS 0.6* 11/05/2015 1409   LYMPHSABS 1.9 10/20/2011 0609   MONOABS 0.6 11/05/2015 1409   MONOABS 1.1* 10/20/2011 0609   EOSABS 0.1 11/05/2015 1409   EOSABS 0.1 10/20/2011 0609   BASOSABS 0.0 11/05/2015 1409  BASOSABS 0.1 10/20/2011 0609      Chemistry      Component Value Date/Time   NA 133* 11/05/2015 1409   NA 132* 10/21/2011 1614   K 4.0 11/05/2015 1409   K 3.6 10/21/2011 1614   CL 99* 11/05/2015 1409   CL 92* 10/21/2011 1614   CO2 27 11/05/2015 1409   CO2 32 10/21/2011 1614   BUN 15 11/05/2015 1409   BUN 7 10/21/2011 1614   CREATININE 0.70 11/05/2015 1409   CREATININE 0.73 10/21/2011 1614      Component Value Date/Time   CALCIUM 8.6* 11/05/2015 1409   CALCIUM 7.9* 10/21/2011 1614   ALKPHOS 40 11/05/2015 1409   ALKPHOS 83 10/21/2011 1614   AST 13* 11/05/2015 1409   AST 97* 10/21/2011 1614   ALT 9* 11/05/2015 1409   ALT 34 10/21/2011 1614   BILITOT 0.3 11/05/2015 1409   BILITOT 1.1* 10/21/2011 1614        PENDING LABS:   RADIOGRAPHIC STUDIES:  Dg Chest  Port 1 View  Oct 24, 2015  CLINICAL DATA:  Congestion, possible shingles EXAM: PORTABLE CHEST 1 VIEW COMPARISON:  None. FINDINGS: Heart size mildly enlarged. Vascular pattern normal. Lungs clear. No pleural effusion. Right Port-A-Cath noted. IMPRESSION: Mild cardiac silhouette enlargement.  Otherwise negative Electronically Signed   By: Skipper Cliche M.D.   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  Treatment course complicated by severe chemoradiation-induced stomatitis.  Oncology history updated.  Labs today: CBC diff, CMET.  I personally reviewed and went over laboratory results with the patient.  The results are noted within this dictation.  He is to continue to follow with speech pathology as directed.  He is educated on the importance of this.  We will discuss future baseline imaging at next follow-up appointment.  NCCN guidelines recommends new baseline imaging within 6 months of completing therapy.  In the future, as he get farther out from completion of treatment, he may be a good candidate for involvement in the survivorship clinic.  We will refer when appropriate.    He was using Nicoderm patches, but has developed contact dermatitis at the sites of these patches.  He admits that he is not yet mentally prepared to quit smoking.  I will refer him to smoking cessation class and he is provided a pamphlet for this.  He is to call the number on that pamphlet to enroll.  He remains on pain medications and he is very interested in tapering.  I initially provided him with a taper over 3 weeks but he is concerned about current pain increasing with taper of  medications that quick.  As a result, we will taper fentanyl over the next 6-8 weeks:  50 mcg x 6 days  37.5 mcg x 12-15 days  25 mcg x 12-15 days  12.5 mcg x 12-15 days  STOP He is agreeable to this plan.  I have written Rx for 12.5 mcg Fentanyl to accommodate this schedule.  He still have 25 mcg patches at home. He will still have breakthrough pain medication.  He needs a refill on Ativan and Oxycodone.  Both are printed today.  He continues with bilateral, lateral tongue ulcers.  I  have asked him to follow with [facility] recommendations regarding application of wax to interior aspects of lower teeth to decrease trauma to tongue.  He notes dry mouth.  This is secondary to chemoXRT.  He is provided education regarding this with some supportive treatment/management options including: sour candies, biotene for dry mouth products.  His sister remains very active and involved in the patient's care, at time overstepping Mr. Giffen wishes regarding his care.  He will return in 3-4 weeks for follow-up.    ORDERS PLACED FOR THIS ENCOUNTER: No orders of the defined types were placed in this encounter.    MEDICATIONS PRESCRIBED THIS ENCOUNTER: Meds ordered this encounter  Medications  . fentaNYL (DURAGESIC - DOSED MCG/HR) 12 MCG/HR    Sig: Place 1 patch (12.5 mcg total) onto the skin every 3 (three) days.    Dispense:  10 patch    Refill:  0    Order Specific Question:  Supervising Provider    Answer:  Patrici Ranks [id number]  . oxyCODONE (ROXICODONE) 5 MG/5ML solution    Sig: Take 5-10 mLs (5-10 mg total) by mouth every 4 (four) hours as needed for severe pain.    Dispense:  250 mL    Refill:  0    Order Specific Question:  Supervising Provider    Answer:  Patrici Ranks [id number]  . LORazepam (ATIVAN) 0.5 MG tablet    Sig: Take 1 tablet (0.5 mg total) by mouth every 4 (four) hours.    Dispense:  30 tablet    Refill:  1    Order Specific Question:  Supervising  Provider    Answer:  Patrici Ranks [id number]    THERAPY PLAN:  NCCN guidelines for surveillance of Head and Neck cancer recommends (1.2017):  A. H+P every 1-3 months for year 1  B. H+P every 2-6 months for year 2  C. H+P every 4-8 months for years 3-5  D. H+P every year for years greater than 5  E. Post-treatment baseline imaging of primary (and neck, if treated) recommended within 6 months of treatment (category 2B).   F. Chest imaging as clinically indicated for patients with smoking history.  G. Further re-imaging as indicated based on worrisome or equivocal signs/symptoms, smoking history, and areas inaccessible to clinical examination.  H. Routine annual imaging may be indicated in areas difficult to visualize on exam.  I. TSH every 6-12 months if neck irradiated.  J. Dental evaluation   1. Recommended for oral cavity and sites exposed to significant intraoral radiation treatment.  K. 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  L. Supportive care and rehabilitation   1. Speech/hearing and swallowing evaluation and rehabilitation as clinically indicated.   2. Nutritional evaluation and rehabilitation as clinically indicated until nutritional status is stabilized.   3. Ongoing surveillance for depression   4. Smoking cessation and alcohol counseling as clinically indicated.  M. For response assessment immediately after chemoradiation or RT (see FOLL-A 2 of 2).  N. Integration of survivorship care and care plan within 1 year, complementary to ongoing involvement from a head and neck oncologist.   All questions were answered. The patient knows to call the clinic with any problems, questions or concerns. We can certainly see the patient much sooner if necessary.  Patient and plan discussed with Dr. Ancil Linsey and she is in agreement with the aforementioned.   This note is electronically signed by: Doy Mince 11/05/2015 6:34 PM

## 2015-11-11 ENCOUNTER — Other Ambulatory Visit (HOSPITAL_COMMUNITY): Payer: Self-pay | Admitting: Oncology

## 2015-11-11 DIAGNOSIS — C090 Malignant neoplasm of tonsillar fossa: Secondary | ICD-10-CM

## 2015-11-11 MED ORDER — OXYCODONE HCL 5 MG/5ML PO SOLN: 5.0000 mg | ORAL | Status: DC | PRN

## 2015-11-13 ENCOUNTER — Encounter (HOSPITAL_COMMUNITY): Payer: Self-pay | Admitting: Speech Pathology

## 2015-11-13 ENCOUNTER — Ambulatory Visit (HOSPITAL_COMMUNITY): Payer: BLUE CROSS/BLUE SHIELD | Admitting: Speech Pathology

## 2015-11-13 DIAGNOSIS — R1312 Dysphagia, oropharyngeal phase: Secondary | ICD-10-CM

## 2015-11-13 NOTE — Therapy (Signed)
[place], [place], [address] Phone: [phone]   Fax:  [phone]  Speech Language Pathology Treatment  Patient Details  Name: Frank Castillo MRN: [id number] Date of Birth: 06/05/1950 No Data Recorded  Encounter Date: 11/13/2015      End of Session - 11/13/15 1534    Visit Number 3   Number of Visits 6   Authorization Type [employer]   SLP Start Time 0903   SLP Stop Time  0958   SLP Time Calculation (min) 55 min   Activity Tolerance Patient tolerated treatment well      Past Medical History  Diagnosis Date  . Tonsillar mass     left  . Portal hypertension ([place])   . Hypertension   . Cirrhosis [facility])     Past Surgical History  Procedure Laterality Date  . Hernia repair Right   . Mouth surgery    . Tonsillectomy Left 07/09/2015    Procedure: LEFT TONSILLECTOMY;  Surgeon: Leta Baptist, MD;  Location: [facility];  Service: ENT;  Laterality: Left;    There were no vitals filed for this visit.      Subjective Assessment - 11/13/15 1530    Subjective "I started the food journal!"   Patient is accompained by: Family member  Sister, Frank Castillo   Currently in Pain? No/denies               ADULT SLP TREATMENT - 11/13/15 1531    General Information   Behavior/Cognition Alert;Cooperative;Pleasant mood   Patient Positioning Upright in chair   Oral care provided N/A   HPI Frank Castillo 65 y.o. male referred by Dr. 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 Pt with mucositis oral and oral candidiasis and treated with nystatin (MYCOSTATIN) 100000 UNIT/ML suspension. Pt will continue with IV fluids three times weekly. Left tonsillectomy 07/09/2015 and PEG placed 08/20/2015. Pt  anticipates that chemo will conclude ~10/22/2015 and radiation ~10/09/2015. Pt referred for swallowing evaluation due to risk of aspiration and malnutrition in setting of head and neck cancer treatment.    Treatment Provided   Treatment provided Dysphagia   Dysphagia Treatment   Temperature Spikes Noted No   Respiratory Status Room air   Oral Cavity - Dentition Adequate natural dentition   Treatment Methods Skilled observation;Therapeutic exercise;Compensation strategy training;Patient/caregiver education   Patient observed directly with PO's Yes   Type of PO's observed Thin liquids   Feeding Able to feed self   Liquids provided via Cup   Pharyngeal Phase Signs & Symptoms Multiple swallows   Amount of cueing Independent   Pain Assessment   Pain Assessment No/denies pain   Assessment / Recommendations / Plan   Plan Continue with current plan of care   Dysphagia Recommendations   Diet recommendations Dysphagia 2 (fine chop);Thin liquid   Liquids provided via Cup;Straw   Medication Administration Whole meds with puree   Supervision Patient able to self feed   Compensations Slow rate;Small sips/bites;Multiple dry swallows after each bite/sip;Follow solids with liquid   Postural Changes and/or Swallow Maneuvers Out of bed for meals;Seated upright 90 degrees;Upright 30-60 min after meal   General Recommendations   Oral Care Recommendations Patient independent with oral care;Oral care before and after PO   Follow up Recommendations Outpatient SLP   Progression Toward Goals  Progression toward goals Progressing toward goals          SLP Education - 11/13/15 1533    Education provided Yes   Education Details continuation of food journal; return in 4-6 weeks or sooner if problems   Person(s) Educated Patient   Methods Explanation   Comprehension Verbalized understanding          SLP Short Term Goals - 11/13/15 1535    SLP SHORT TERM GOAL #1   Title Pt will complete swallowing  exercises 3x/day 7 days a week with use of written cues and pt/caregiver report.    Baseline not completing   Time 2   Period Months   Status On-going   SLP SHORT TERM GOAL #2   Title Pt will increase oral intake of all consistencies and textures to regular/thin for 3 small meals a day to decrease dependency of PEG and facilitate safe swallow function going forward.    Baseline sipping liquids only   Time 2   Period Months   Status On-going          SLP Long Term Goals - 11/13/15 1535    SLP LONG TERM GOAL #1   Title Pt will demonstrate safe and efficient consumption of self regulated regular textures with use of compensatory strategies as needed.   Baseline Pt only drinking liquids at this time due to odynophagia   Time 2   Period Months   Status On-going          Plan - 11/13/15 1534    Clinical Impression Statement Frank Castillo was accompanied to today's appointment by his sister, Frank Castillo. Progress toward goals: 1) Pt will complete swallowing exercises 3x/day 7 days a week with use of written cues and pt/caregiver report. Pt admits to not completing swallowing exercises as often as directed. SLP modified to focusing on two exercises (CTAR and lingual strengthening) and continuation of trismus stretch. Pt willing to comply. 2) Pt will increase oral intake of all consistencies and textures to regular/thin for 3 small meals a day to decrease dependency of PEG and facilitate safe swallow function going forward. Pt finally started to keep a food journal of intake! He has been consuming a variety of soft/smooth textures daily (occasionally does not on "hard" days). This is a tremendous improvement for him and he was encouraged to slowly add more every day. His primary goal is to return to work, but he needs to be off pain medications before he can do that. Will space next visit out and have pt return in 4-6 weeks, but he understands that he should call if he notes any decline in swallow  function.   Speech Therapy Frequency Monthly   Duration --  2 more visits every 4-6 weeks   Treatment/Interventions Aspiration precaution training;Pharyngeal strengthening exercises;Diet toleration management by SLP;SLP instruction and feedback;Compensatory strategies;Patient/family education   Potential to Achieve Goals Good   SLP Home Exercise Plan Pt will be independent with HEP as assigned to facilitate carryover of treatment strategies at home   Consulted and Agree with Plan of Care Patient      Patient will benefit from skilled therapeutic intervention in order to improve the following deficits and impairments:   Dysphagia, oropharyngeal phase    Problem List Patient Active Problem List   Diagnosis Date Noted  . Dysuria 10/25/2015  . Decreased oral intake 10/25/2015  . Malnutrition of moderate degree 10/19/2015  . Antineoplastic chemotherapy induced pancytopenia ([place]) 10/18/2015  . Fever   .  Candidiasis of mouth and esophagus ([place]) 10/17/2015  . Neutropenia, febrile ([place]) 10/17/2015  . Shingles rash 10/17/2015  . Nausea with vomiting 09/20/2015  . Hyponatremia 09/20/2015  . Oral pharyngeal candidiasis 09/20/2015  . Antineoplastic chemotherapy induced anemia 09/20/2015  . Hypertension   . Cirrhosis ([place])   . Portal hypertension ([place])   . Cancer of tonsillar fossa [facility]) 08/02/2015   Thank you,  Frank Castillo, [place]  [facility] 11/13/2015, 3:36 PM  [place] [address] [place], [place], [address] Phone: [phone]   Fax:  [phone]   Name: Frank Castillo MRN: [id number] Date of Birth: 10/12/1950

## 2015-11-14 ENCOUNTER — Other Ambulatory Visit: Payer: Self-pay | Admitting: Radiation Oncology

## 2015-11-14 DIAGNOSIS — C090 Malignant neoplasm of tonsillar fossa: Secondary | ICD-10-CM

## 2015-11-20 ENCOUNTER — Telehealth (HOSPITAL_COMMUNITY): Payer: Self-pay | Admitting: *Deleted

## 2015-11-21 ENCOUNTER — Other Ambulatory Visit (HOSPITAL_COMMUNITY): Payer: Self-pay | Admitting: *Deleted

## 2015-11-21 ENCOUNTER — Other Ambulatory Visit (HOSPITAL_COMMUNITY): Payer: Self-pay | Admitting: Oncology

## 2015-11-21 DIAGNOSIS — C090 Malignant neoplasm of tonsillar fossa: Secondary | ICD-10-CM

## 2015-11-21 DIAGNOSIS — R112 Nausea with vomiting, unspecified: Secondary | ICD-10-CM

## 2015-11-21 MED ORDER — LORAZEPAM 0.5 MG PO TABS
0.5000 mg | ORAL_TABLET | Freq: Four times a day (QID) | ORAL | Status: DC | PRN
Start: 2015-11-21 — End: 2015-12-04

## 2015-11-21 MED ORDER — OXYCODONE HCL 5 MG/5ML PO SOLN: 5.0000 mg | ORAL | Status: DC | PRN

## 2015-11-21 NOTE — Telephone Encounter (Signed)
He should have a refill on his Ativan.  Pain medication is printed.  TK

## 2015-11-28 ENCOUNTER — Encounter (HOSPITAL_COMMUNITY): Payer: Self-pay | Admitting: Hematology & Oncology

## 2015-11-28 ENCOUNTER — Encounter (HOSPITAL_COMMUNITY): Payer: BLUE CROSS/BLUE SHIELD | Attending: Hematology & Oncology | Admitting: Hematology & Oncology

## 2015-11-28 VITALS — BP 142/89 | HR 79 | Temp 98.0°F | Resp 16 | Wt 154.8 lb

## 2015-11-28 DIAGNOSIS — D6481 Anemia due to antineoplastic chemotherapy: Secondary | ICD-10-CM

## 2015-11-28 DIAGNOSIS — C099 Malignant neoplasm of tonsil, unspecified: Secondary | ICD-10-CM | POA: Insufficient documentation

## 2015-11-28 DIAGNOSIS — T451X5A Adverse effect of antineoplastic and immunosuppressive drugs, initial encounter: Secondary | ICD-10-CM

## 2015-11-28 DIAGNOSIS — I890 Lymphedema, not elsewhere classified: Secondary | ICD-10-CM

## 2015-11-28 DIAGNOSIS — D61810 Antineoplastic chemotherapy induced pancytopenia: Secondary | ICD-10-CM

## 2015-11-28 DIAGNOSIS — R112 Nausea with vomiting, unspecified: Secondary | ICD-10-CM | POA: Insufficient documentation

## 2015-11-28 DIAGNOSIS — K766 Portal hypertension: Secondary | ICD-10-CM

## 2015-11-28 DIAGNOSIS — C090 Malignant neoplasm of tonsillar fossa: Secondary | ICD-10-CM

## 2015-11-28 DIAGNOSIS — R05 Cough: Secondary | ICD-10-CM | POA: Insufficient documentation

## 2015-11-28 DIAGNOSIS — Z95828 Presence of other vascular implants and grafts: Secondary | ICD-10-CM | POA: Insufficient documentation

## 2015-11-28 DIAGNOSIS — K7460 Unspecified cirrhosis of liver: Secondary | ICD-10-CM | POA: Diagnosis not present

## 2015-11-28 DIAGNOSIS — IMO0002 Reserved for concepts with insufficient information to code with codable children: Secondary | ICD-10-CM

## 2015-11-28 DIAGNOSIS — L739 Follicular disorder, unspecified: Secondary | ICD-10-CM | POA: Insufficient documentation

## 2015-11-28 DIAGNOSIS — B370 Candidal stomatitis: Secondary | ICD-10-CM | POA: Insufficient documentation

## 2015-11-28 MED ORDER — FENTANYL 25 MCG/HR TD PT72: 25.0000 ug | MEDICATED_PATCH | TRANSDERMAL | Status: DC

## 2015-11-28 NOTE — Progress Notes (Signed)
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 He is seen at [facility], Hepatitis C negative CT neck with contrast 06/25/2015 at [place] level 2 malignant LAD, largest node is 3.5 cm long axis. R worse than L carotid atherosclerosis and stenosis. CT chest with no acute or significant findings within the chest    Cancer of tonsillar fossa ([place])   08/02/2015 Initial Diagnosis    Cancer of tonsillar fossa ([place])     08/20/2015 Procedure    Port-a-cath placed by IR     08/20/2015 Procedure    G-tube placed by IR     08/21/2015 - 10/09/2015 Radiation Therapy    70 Gy, Dr. Isidore Moos.     08/21/2015 - 09/10/2015 Chemotherapy    Cisplatin every 21 days with XRT.     08/28/2015 Adverse Reaction    Tinnitis, Cisplatin-induced.     09/11/2015 - 09/30/2015 Chemotherapy    Carboplatin/5FU     09/20/2015 - 09/21/2015 Hospital Admission    Intractable nausea/vomiting. Severe Mucositis     10/17/2015 - 10/20/2015 Hospital Admission    Esophageal/oral candidasis. Shingles. Fever. Chemotherapy induced pancytopenia      HISTORY OF PRESENTING ILLNESS:  Frank Castillo 65 y.o. male is here for further follow-up of Stage IVA T2N2bM0 squamous cell Ca of the L tonsil. Left tonsillectomy performed on 07/09/2015 with Dr. Benjamine Mola. He has completed therapy.   Frank Castillo returns to the [facility] today accompanied by his sister.  He is down 6-8 pounds since last time, which his sister blames herself for (since she was gone for six days). He says that his lack of eating had nothing to do with her. He remarks that food has very little  taste; eating "is work for the most part," and he's "not motivated to eat." In spite of this, he notes that he had fresh mixed fruit and yogurt this morning. Yesterday he had baked spaghetti, with tomato, cucumber, and onion. He says that he does have some taste, but his mouth dryness is a huge issue. He is ready to keep trying to eat things.  When he had his crown put in the other day, his dentist checked his salivary glands and confirmed that he is producing saliva, but that he's "no 65 year old."  He notes that he experiences hunger every now and then, but it's very rare. He says he's been doing four cans of jevity a day. He confirms that he's trying all kinds of things, and doesn't want to be dependent on the jevity.  He sees Frank Castillo again the end of the month, on the 26th.  His pain has diminished a lot from the shingles, noting it doesn't bother him as much.  Frank Castillo says that his main pain is in his throat, and he feels like he has "this lesion in my throat." It's noticeable when he yawns, during any "opening effect." His exercises don't affect this pain. He says that he's been trying to massage his edema in his neck, rubbing it.   He would like to be able to drive and was reassured that he can do this.  He notes that he is weaning himself off of  the short-acting pain medication slowly, taking less these days.  His sister notes that he's had more energy.  Frank Castillo does think he's doing better with hydration, but he worries about his dry mouth; especially when it comes to sleeping. He says "right now I want it to come back."  During the physical exam, he denies issues with his feeding tube. He confirms smoking again, reporting skin irritation with the patch.  Dr. Isidore Moos scheduled his PET scan for the 1st.   MEDICAL HISTORY:  Past Medical History:  Diagnosis Date  . Cirrhosis ([place])   . Hypertension   . Portal hypertension ([place])   . Tonsillar mass    left    SURGICAL  HISTORY: Past Surgical History:  Procedure Laterality Date  . HERNIA REPAIR Right   . MOUTH SURGERY    . TONSILLECTOMY Left 07/09/2015   Procedure: LEFT TONSILLECTOMY;  Surgeon: Leta Baptist, MD;  Location: [facility];  Service: ENT;  Laterality: Left;    SOCIAL HISTORY: Social History   Social History  . Marital status: Single    Spouse name: N/A  . Number of children: 0  . Years of education: N/A   Occupational History  . Not on file.   Social History Main Topics  . Smoking status: Current Every Day Smoker    Packs/day: 0.50    Types: Cigarettes  . Smokeless tobacco: Never Used  . Alcohol use No     Comment: former abuse- quit 09/2011  . Drug use: No  . Sexual activity: Not on file   Other Topics Concern  . Not on file   Social History Narrative   Divorced.   No children.   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   Currently smoking approximately one pack per day.   Has been smoking off/on since age 32.   Divorced No children. Just step-children. 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 Previously worked in [employer]. Originally from [place], [place]. Current smoker at 1 ppd. Began smoking at 43-17 yo, regularly smoking at [age] yo. During his "cocaine years" he smoked about 2 ppd. ETOH, recovering alcoholic. 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 He enjoys reading. 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  FAMILY HISTORY: Family History  Problem Relation Age of Onset  . Macular degeneration Mother   . Alcoholism Father    indicated that his mother is alive. He indicated that his father is deceased.    Mother still living at 17 years-old, with macular degeneration. She has no other health issues. Father deceased. Parents divorced at 62 years-old, so he is not sure what he died from. Father was an alcoholic. 4 sisters. 1 sister died of suicide. 1 sister was adopted. 1 Sister lives in [place]. 1 Sister lives in [place].  ALLERGIES:   is allergic to bee venom and other.  MEDICATIONS:  Current Outpatient Prescriptions  Medication Sig Dispense Refill  . cetirizine (ZYRTEC) 10 MG tablet Take 10 mg by mouth daily.    . cholecalciferol (VITAMIN D) 1000 units tablet Take 1,000 Units by mouth daily.    . fentaNYL (DURAGESIC - DOSED MCG/HR) 12 MCG/HR Place 1 patch (12.5 mcg total) onto the skin every 3 (three) days. (Patient not taking: Reported on 12/17/2015) 10 patch 0  . metoCLOPramide (REGLAN) 5 MG tablet Take 1 tablet (5 mg total) by mouth 4 (four) times daily -  before meals and at bedtime. 240 tablet 1  . Nutritional Supplements (JEVITY 1.2  CAL/FIBER) LIQD 8 cans/day.  4 feeding of 2 cans each. Flush with 100 mls before and after each feeding 1000 mL 6  . ondansetron (ZOFRAN ODT) 8 MG disintegrating tablet Take 1 tablet (8 mg total) by mouth every 8 (eight) hours as needed for nausea or vomiting. 30 tablet 2  . sodium fluoride (FLUORISHIELD) 1.1 % GEL dental gel Instill one drop of gel per tooth space of fluoride tray. Place over teeth for 5 minutes. Remove. Spit out excess. Repeat nightly. 120 mL prn  . buPROPion (WELLBUTRIN XL) 150 MG 24 hr tablet Take once daily in the am, after 5 days increase to two tablets 60 tablet 3  . fentaNYL (DURAGESIC - DOSED MCG/HR) 25 MCG/HR patch Place 1 patch (25 mcg total) onto the skin every 3 (three) days. (Patient not taking: Reported on 12/17/2015) 10 patch 0  . LORazepam (ATIVAN) 0.5 MG tablet Take 1 tablet (0.5 mg total) by mouth every 6 (six) hours as needed for anxiety. 30 tablet 0  . oxyCODONE (ROXICODONE) 5 MG/5ML solution Take 5-10 mLs (5-10 mg total) by mouth every 4 (four) hours as needed for severe pain. 250 mL 0   No current facility-administered medications for this visit.    Facility-Administered Medications Ordered in Other Visits  Medication Dose Route Frequency Provider Last Rate Last Dose  . 0.9 %  sodium chloride infusion   Intravenous Once [employer], PA-C      .  0.9 %  sodium chloride infusion   Intravenous Continuous Manon Hilding Kefalas, PA-C      . 0.9 %  sodium chloride infusion   Intravenous Continuous Thomas S Kefalas, PA-C      . 0.9 %  sodium chloride infusion   Intravenous Continuous Shannon K Penland, MD      . 0.9 %  sodium chloride infusion   Intravenous Continuous Kelby Fam Penland, MD      . 0.9 %  sodium chloride infusion   Intravenous Continuous Patrici Ranks, MD        Review of Systems  Constitutional: Positive for malaise/fatigue. Negative for fever, chills.  HENT: Positive for mouth pain, and sore throat. Negative for congestion, hearing loss and nosebleeds.   Eyes: Negative.  Negative for blurred vision, double vision, pain and discharge.  Respiratory: Positive for sputum production, less thick  Negative for cough, hemoptysis, shortness of breath and wheezing.   Has thick secretions (improved).  Cardiovascular: Negative.  Negative for chest pain, palpitations, claudication, leg swelling and PND.  Gastrointestinal: Negative for heartburn, vomiting, abdominal pain, blood in stool and melena.  Genitourinary: Negative.  Negative for dysuria, urgency, frequency and hematuria.  Musculoskeletal: Negative.  Negative for myalgias, joint pain and falls.  Skin: Negative  Neurological: Negative.  Negative for dizziness, tingling, tremors, sensory change, speech change, focal weakness, seizures, loss of consciousness, weakness and headaches.  Endo/Heme/Allergies: Negative.  Does not bruise/bleed easily.  Psychiatric/Behavioral: Positive for insomnia.  Negative for depression, suicidal ideas, memory loss and substance abuse. The patient is not nervous/anxious. All other systems reviewed and are negative. 14 point ROS was done and is otherwise as detailed above or in HPI    PHYSICAL EXAMINATION: ECOG PERFORMANCE STATUS: 1 - Symptomatic but completely ambulatory  Vitals:   11/28/15 1023  BP: (!) 142/89  Pulse: 79  Resp: 16  Temp: 98 F  (36.7 C)    Physical Exam  Constitutional: He is oriented to person, place, and time  and in no distress.  HENT:  Head: Normocephalic and atraumatic.  Nose: Nose normal.  Mouth/Throat: No mucositis. Oropharynx clear. Tongue with normal ROM Eyes: Conjunctivae and EOM are normal. Pupils are equal, round, and reactive to light. Right eye exhibits no discharge. Left eye exhibits no discharge. No scleral icterus.  Neck: Normal range of motion. Neck supple. No tracheal deviation present. No thyromegaly present. Lymphedema noted.  Cardiovascular: Normal rate, regular rhythm and normal heart sounds.  Exam reveals no gallop and no friction rub.  Port site is C/D/I no erythema or bruising No murmur heard.  Pulmonary/Chest: Effort normal and breath sounds normal. He has no wheezes. He has no rales.  Abdominal: Soft. Bowel sounds are normal. He exhibits no distension and no mass. There is no tenderness. There is no rebound and no guarding. FT site is C/D/I, no erythema Musculoskeletal: Normal range of motion. He exhibits no edema.  Neurological: He is alert and oriented to person, place, and time. He has normal reflexes. No cranial nerve deficit. Gait normal. Coordination normal.  Skin: Skin is warm and dry.  Psychiatric: Mood, memory, affect and judgment normal.  Nursing note and vitals reviewed.    LABORATORY DATA:  I have reviewed the data as listed Results for DARRION, WYSZYNSKI (MRN [id number]) as of 12/22/2015 10:48  Ref. Range 11/05/2015 14:09  Sodium Latest Ref Range: 135 - 145 mmol/L 133 (L)  Potassium Latest Ref Range: 3.5 - 5.1 mmol/L 4.0  Chloride Latest Ref Range: 101 - 111 mmol/L 99 (L)  CO2 Latest Ref Range: 22 - 32 mmol/L 27  BUN Latest Ref Range: 6 - 20 mg/dL 15  Creatinine Latest Ref Range: 0.61 - 1.24 mg/dL 0.70  Calcium Latest Ref Range: 8.9 - 10.3 mg/dL 8.6 (L)  EGFR (Non-African Amer.) Latest Ref Range: >60 mL/min >60  EGFR (African American) Latest Ref Range: >60 mL/min >60    Glucose Latest Ref Range: 65 - 99 mg/dL 123 (H)  Anion gap Latest Ref Range: 5 - 15  7  Alkaline Phosphatase Latest Ref Range: 38 - 126 U/L 40  Albumin Latest Ref Range: 3.5 - 5.0 g/dL 3.5  AST Latest Ref Range: 15 - 41 U/L 13 (L)  ALT Latest Ref Range: 17 - 63 U/L 9 (L)  Total Protein Latest Ref Range: 6.5 - 8.1 g/dL 6.3 (L)  Total Bilirubin Latest Ref Range: 0.3 - 1.2 mg/dL 0.3  WBC Latest Ref Range: 4.0 - 10.5 K/uL 4.3  RBC Latest Ref Range: 4.22 - 5.81 MIL/uL 3.15 (L)  Hemoglobin Latest Ref Range: 13.0 - 17.0 g/dL 10.1 (L)  HCT Latest Ref Range: 39.0 - 52.0 % 30.0 (L)  MCV Latest Ref Range: 78.0 - 100.0 fL 95.2  MCH Latest Ref Range: 26.0 - 34.0 pg 32.1  MCHC Latest Ref Range: 30.0 - 36.0 g/dL 33.7  RDW Latest Ref Range: 11.5 - 15.5 % 19.1 (H)  Platelets Latest Ref Range: 150 - 400 K/uL 295  Neutrophils Latest Units: % 70  Lymphocytes Latest Units: % 13  Monocytes Relative Latest Units: % 14  Eosinophil Latest Units: % 2  Basophil Latest Units: % 1  NEUT# Latest Ref Range: 1.7 - 7.7 K/uL 3.0  Lymphocyte # Latest Ref Range: 0.7 - 4.0 K/uL 0.6 (L)  Monocyte # Latest Ref Range: 0.1 - 1.0 K/uL 0.6  Eosinophils Absolute Latest Ref Range: 0.0 - 0.7 K/uL 0.1  Basophils Absolute Latest Ref Range: 0.0 - 0.1 K/uL 0.0     RADIOGRAPHIC STUDIES: I have personally reviewed the radiological reports  as listed.  Study Result     CLINICAL DATA: Initial treatment strategy for primary tonsillar squamous cell carcinoma. Left-sided lung tonsillectomy.  EXAM: NUCLEAR MEDICINE PET SKULL BASE TO THIGH  TECHNIQUE: 8.8 mCi F-18 FDG was injected intravenously. Full-ring PET imaging was performed from the skull base to thigh after the radiotracer. CT data was obtained and used for attenuation correction and anatomic localization.  FASTING BLOOD GLUCOSE: Value: 116 mg/dl  COMPARISON: Clinic note of 07/24/2015. Neck and chest CTs of 06/25/2015.  FINDINGS: NECK  No tonsilar  hypermetabolism.  Hypermetabolic left-sided level 2 nodes. Index node measures 2.1 cm and a S.U.V. max of 17.9 on image 25/series 4. 1.9 cm on 06/25/2015.  Immediately inferior node measures 9 mm and a S.U.V. max of 3.8 on image 30/series 4.  CHEST  No areas of abnormal hypermetabolism.  ABDOMEN/PELVIS  No areas of abnormal hypermetabolism.  SKELETON  No abnormal marrow activity.  CT IMAGES PERFORMED FOR ATTENUATION CORRECTION  carotid atherosclerosis. Mild cardiomegaly with multivessel coronary artery atherosclerosis. Mild centrilobular emphysema. Mild bibasilar atelectasis. Normal adrenal glands. Gallstones and a contracted gallbladder. Moderate cirrhosis. Advanced abdominal aortic atherosclerosis. Scattered colonic diverticula. Mild prostatomegaly. Fat containing left inguinal hernia. Prior right inguinal hernia repair.  IMPRESSION: 1. Hypermetabolic left-sided level 2 adenopathy, consistent with nodal metastasis. 2. No extracervical metastatic disease identified. 3. Cirrhosis. 4. Atherosclerosis, including within the coronary arteries. 5. Cholelithiasis.   Electronically Signed  By: Abigail Miyamoto M.D.  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 He is seen at [facility], Hepatitis C negative CT neck with contrast 06/25/2015 at [place] level 2 malignant LAD, largest node is 3.5 cm long axis. R worse than L carotid atherosclerosis and stenosis. CT chest with no acute or significant findings within the chest [facility] stay secondary to intractable n/v and mucositis Oropharyngeal candidiasis/esophageal candidiasis Odynophagia Fever Shingles Chemotherapy induced pancytopenia  He is much improved. I encouraged his oral intake. He is to continue with his TF, I have asked him to use his  jevity at night. He is to continue to work with speech therapy. We will refer him to lymphedema therapy.   Will continue with fentanyl 25 mcg for now. He will continue to taper his short acting pain medication use.   Smoking cessation was clearly addressed.   He will return in 2 weeks. We will continue to monitor weight. Continue to taper pain medication.   Orders Placed This Encounter  Procedures  . CBC with Differential    Standing Status:   Standing    Number of Occurrences:   1  . CBC with Differential    Standing Status:   Future    Number of Occurrences:   1    Standing Expiration Date:   11/27/2016  . Comprehensive metabolic panel    Standing Status:   Future    Number of Occurrences:   1    Standing Expiration Date:   11/27/2016   All questions were answered. The patient knows to call the clinic with any problems, questions or concerns.  This document serves as a record of services personally performed by Ancil Linsey, MD. It was created on her behalf by Toni Amend, a trained medical scribe. The creation of this record is based on the scribe's personal observations and the provider's statements to them. This document has been checked and approved  by the attending provider.  I have reviewed the above documentation for accuracy and completeness, and I agree with the above.  This note was electronically signed.    Molli Hazard, MD  12/22/2015 10:43 AM

## 2015-11-28 NOTE — Patient Instructions (Signed)
[place] at [facility] Discharge Instructions  RECOMMENDATIONS MADE BY THE CONSULTANT AND ANY TEST RESULTS WILL BE SENT TO YOUR REFERRING PHYSICIAN.  Return to clinic in 2 weeks.  Need labs at this time.   Thank you for choosing [place] at [facility] to provide your oncology and hematology care.  To afford each patient quality time with our provider, please arrive at least 15 minutes before your scheduled appointment time.   Beginning January 23rd 2017 lab work for the [facility] will be done in the  Main lab at [facility] on 1st floor. If you have a lab appointment with the [facility] please come in thru the  Main Entrance and check in at the main information desk  You need to re-schedule your appointment should you arrive 10 or more minutes late.  We strive to give you quality time with our providers, and arriving late affects you and other patients whose appointments are after yours.  Also, if you no show three or more times for appointments you may be dismissed from the clinic at the providers discretion.     Again, thank you for choosing [facility].  Our hope is that these requests will decrease the amount of time that you wait before being seen by our physicians.       _____________________________________________________________  Should you have questions after your visit to [facility], please contact our office at [phone] between the hours of 8:30 a.m. and 4:30 p.m.  Voicemails left after 4:30 p.m. will not be returned until the following business day.  For prescription refill requests, have your pharmacy contact our office.         Resources For Cancer Patients and their Caregivers ? American Cancer Society: Can assist with transportation, wigs, general needs, runs Look Good Feel Better.        619-313-2159 ? Cancer Care: Provides financial assistance, online support groups,  medication/co-pay assistance.  1-800-813-HOPE 979-270-3898) ? [place] Assists [place] cancer patients and their families through emotional , educational and financial support.  (332) 305-3334 ? [place] DSS Where to apply for food stamps, Medicaid and utility assistance. (661)695-2682 ? [facility]: Transportation to medical appointments. 548 498 9583 ? Social Security Administration: May apply for disability if have a Stage IV cancer. 531 182 3604 365-415-9702 ? [employer], Disability and Transit Services: Assists with nutrition, care and transit needs. [place] Support Programs: @10RELATIVEDAYS @ > Cancer Support Group  2nd Tuesday of the month 1pm-2pm, Journey Room  > Creative Journey  3rd Tuesday of the month 1130am-1pm, Journey Room  > Look Good Feel Better  1st Wednesday of the month 10am-12 noon, Journey Room (Call [place] to register [phone])

## 2015-12-03 ENCOUNTER — Telehealth (HOSPITAL_COMMUNITY): Payer: Self-pay | Admitting: *Deleted

## 2015-12-03 NOTE — Telephone Encounter (Signed)
Patient states he has more edema in his throat. Will alert Dr. Whitney Muse.

## 2015-12-04 ENCOUNTER — Other Ambulatory Visit (HOSPITAL_COMMUNITY): Payer: Self-pay | Admitting: Oncology

## 2015-12-04 DIAGNOSIS — C090 Malignant neoplasm of tonsillar fossa: Secondary | ICD-10-CM

## 2015-12-04 MED ORDER — OXYCODONE HCL 5 MG/5ML PO SOLN: 5.0000 mg | ORAL | Status: DC | PRN

## 2015-12-04 MED ORDER — LORAZEPAM 0.5 MG PO TABS: 0.5000 mg | ORAL_TABLET | Freq: Four times a day (QID) | ORAL | Status: DC | PRN

## 2015-12-12 ENCOUNTER — Ambulatory Visit: Payer: BLUE CROSS/BLUE SHIELD | Admitting: Physical Therapy

## 2015-12-13 ENCOUNTER — Other Ambulatory Visit (HOSPITAL_COMMUNITY): Payer: Self-pay | Admitting: Emergency Medicine

## 2015-12-13 DIAGNOSIS — C090 Malignant neoplasm of tonsillar fossa: Secondary | ICD-10-CM

## 2015-12-13 MED ORDER — OXYCODONE HCL 5 MG/5ML PO SOLN: 5.0000 mg | ORAL | Status: DC | PRN

## 2015-12-17 ENCOUNTER — Ambulatory Visit: Payer: BLUE CROSS/BLUE SHIELD | Attending: Hematology & Oncology | Admitting: Physical Therapy

## 2015-12-17 ENCOUNTER — Encounter (HOSPITAL_COMMUNITY): Payer: BLUE CROSS/BLUE SHIELD

## 2015-12-17 ENCOUNTER — Encounter (HOSPITAL_BASED_OUTPATIENT_CLINIC_OR_DEPARTMENT_OTHER): Payer: BLUE CROSS/BLUE SHIELD | Admitting: Hematology & Oncology

## 2015-12-17 ENCOUNTER — Encounter: Payer: Self-pay | Admitting: Physical Therapy

## 2015-12-17 ENCOUNTER — Encounter (HOSPITAL_COMMUNITY): Payer: Self-pay | Admitting: Hematology & Oncology

## 2015-12-17 VITALS — BP 133/77 | HR 79 | Temp 98.8°F | Resp 18 | Wt 155.4 lb

## 2015-12-17 DIAGNOSIS — T451X5A Adverse effect of antineoplastic and immunosuppressive drugs, initial encounter: Secondary | ICD-10-CM

## 2015-12-17 DIAGNOSIS — R05 Cough: Secondary | ICD-10-CM | POA: Diagnosis present

## 2015-12-17 DIAGNOSIS — M6281 Muscle weakness (generalized): Secondary | ICD-10-CM | POA: Diagnosis present

## 2015-12-17 DIAGNOSIS — D61810 Antineoplastic chemotherapy induced pancytopenia: Secondary | ICD-10-CM

## 2015-12-17 DIAGNOSIS — K766 Portal hypertension: Secondary | ICD-10-CM | POA: Diagnosis not present

## 2015-12-17 DIAGNOSIS — C090 Malignant neoplasm of tonsillar fossa: Secondary | ICD-10-CM | POA: Diagnosis not present

## 2015-12-17 DIAGNOSIS — F329 Major depressive disorder, single episode, unspecified: Secondary | ICD-10-CM

## 2015-12-17 DIAGNOSIS — K7460 Unspecified cirrhosis of liver: Secondary | ICD-10-CM | POA: Diagnosis not present

## 2015-12-17 DIAGNOSIS — H9313 Tinnitus, bilateral: Secondary | ICD-10-CM

## 2015-12-17 DIAGNOSIS — Z95828 Presence of other vascular implants and grafts: Secondary | ICD-10-CM | POA: Diagnosis present

## 2015-12-17 DIAGNOSIS — C099 Malignant neoplasm of tonsil, unspecified: Secondary | ICD-10-CM | POA: Diagnosis present

## 2015-12-17 DIAGNOSIS — I890 Lymphedema, not elsewhere classified: Secondary | ICD-10-CM | POA: Diagnosis present

## 2015-12-17 DIAGNOSIS — L739 Follicular disorder, unspecified: Secondary | ICD-10-CM | POA: Diagnosis present

## 2015-12-17 DIAGNOSIS — Z720 Tobacco use: Secondary | ICD-10-CM

## 2015-12-17 DIAGNOSIS — R112 Nausea with vomiting, unspecified: Secondary | ICD-10-CM | POA: Diagnosis present

## 2015-12-17 DIAGNOSIS — B370 Candidal stomatitis: Secondary | ICD-10-CM | POA: Diagnosis present

## 2015-12-17 DIAGNOSIS — D6481 Anemia due to antineoplastic chemotherapy: Secondary | ICD-10-CM

## 2015-12-17 LAB — CBC WITH DIFFERENTIAL/PLATELET
BASOS ABS: 0.1 10*3/uL (ref 0.0–0.1)
Basophils Relative: 1 %
EOS PCT: 2 %
Eosinophils Absolute: 0.1 10*3/uL (ref 0.0–0.7)
HCT: 38.7 % — ABNORMAL LOW (ref 39.0–52.0)
HEMOGLOBIN: 12.7 g/dL — AB (ref 13.0–17.0)
LYMPHS ABS: 0.8 10*3/uL (ref 0.7–4.0)
LYMPHS PCT: 12 %
MCH: 31.7 pg (ref 26.0–34.0)
MCHC: 32.8 g/dL (ref 30.0–36.0)
MCV: 96.5 fL (ref 78.0–100.0)
Monocytes Absolute: 0.6 10*3/uL (ref 0.1–1.0)
Monocytes Relative: 8 %
NEUTROS PCT: 77 %
Neutro Abs: 5.4 10*3/uL (ref 1.7–7.7)
PLATELETS: 214 10*3/uL (ref 150–400)
RBC: 4.01 MIL/uL — AB (ref 4.22–5.81)
RDW: 14.2 % (ref 11.5–15.5)
WBC: 7 10*3/uL (ref 4.0–10.5)

## 2015-12-17 LAB — COMPREHENSIVE METABOLIC PANEL
ALK PHOS: 48 U/L (ref 38–126)
ALT: 11 U/L — AB (ref 17–63)
AST: 14 U/L — AB (ref 15–41)
Albumin: 4 g/dL (ref 3.5–5.0)
Anion gap: 8 (ref 5–15)
BUN: 15 mg/dL (ref 6–20)
CALCIUM: 9.3 mg/dL (ref 8.9–10.3)
CHLORIDE: 100 mmol/L — AB (ref 101–111)
CO2: 27 mmol/L (ref 22–32)
CREATININE: 0.69 mg/dL (ref 0.61–1.24)
Glucose, Bld: 137 mg/dL — ABNORMAL HIGH (ref 65–99)
Potassium: 5 mmol/L (ref 3.5–5.1)
SODIUM: 135 mmol/L (ref 135–145)
Total Bilirubin: 0.3 mg/dL (ref 0.3–1.2)
Total Protein: 6.8 g/dL (ref 6.5–8.1)

## 2015-12-17 MED ORDER — BUPROPION HCL ER (XL) 150 MG PO TB24: ORAL_TABLET | ORAL | 3 refills | Status: DC

## 2015-12-17 NOTE — Therapy (Signed)
[place], [place], [address] Phone: [phone]   Fax:  [phone]  Physical Therapy Evaluation  Patient Details  Name: Frank Castillo MRN: [id number] Date of Birth: February 17, 1951 Referring Provider: Whitney Muse  Encounter Date: 12/17/2015      PT End of Session - 12/17/15 1629    Visit Number 1   Number of Visits 13   Date for PT Re-Evaluation 01/14/16   PT Start Time [id number]   PT Stop Time 1435   PT Time Calculation (min) 41 min   Activity Tolerance Patient tolerated treatment well   Behavior During Therapy [facility] for tasks assessed/performed      Past Medical History:  Diagnosis Date  . Cirrhosis ([place])   . Hypertension   . Portal hypertension ([place])   . Tonsillar mass    left    Past Surgical History:  Procedure Laterality Date  . HERNIA REPAIR Right   . MOUTH SURGERY    . TONSILLECTOMY Left 07/09/2015   Procedure: LEFT TONSILLECTOMY;  Surgeon: Leta Baptist, MD;  Location: [facility];  Service: ENT;  Laterality: Left;    There were no vitals filed for this visit.       Subjective Assessment - 12/17/15 1400    Subjective Cancer treatments started the last week of March. I finished my treatments in mid May. That was both radiation and chemotherapy for throat and neck. 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 In Feb I had my left tonsil taken out. My swelling started about three weeks ago. I have lost 30 lbs since March. I haven't been active for so long. I don't have any stamina.    Patient is accompained by: Family member  sister   Pertinent History Frank Castillo 65 y.o. male referred by Dr. 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 Yes   Pain Score 2    Pain Location Throat   Pain  Orientation Left;Lateral;Medial   Pain Descriptors / Indicators Sore   Pain Type Surgical pain   Pain Onset More than a month ago   Pain Frequency Constant   Pain Relieving Factors pain meds, pain patch            [facility] PT Assessment - 12/17/15 0001      Assessment   Medical Diagnosis left tonsillar squamous cell carcinoma   Referring Provider [place]   Onset Date/Surgical Date 07/09/15   Hand Dominance Right   Prior Therapy currently receiving speech therapy      Precautions   Precautions Other (comment)  lymphedema     Restrictions   Weight Bearing Restrictions No     Balance Screen   Has the patient fallen in the past 6 months Yes  trying to pick up his mother who had fallen   How many times? 1   Has the patient had a decrease in activity level because of a fear of falling?  No   Is the patient reluctant to leave their home because of a fear of falling?  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, desk work, gardening   Leisure pt does not currently exercise     Cognition   Overall Cognitive Status Within Functional Limits for tasks assessed     Observation/Other Assessments   Other Surveys  --  LLIS: 26% impairment     Strength   Overall Strength Within functional limits for tasks performed  5/5 for UE and LEs           LYMPHEDEMA/ONCOLOGY QUESTIONNAIRE - 12/17/15 1417      Type   Cancer Type left tonsillar squamous cell carcinoma     Surgeries   Other Surgery Date 07/09/15     Date Lymphedema/Swelling  Started   Date 11/15/15     Treatment   Active Chemotherapy Treatment No   Past Chemotherapy Treatment Yes   Date 10/09/15   Active Radiation Treatment No   Past Radiation Treatment Yes   Date 10/09/15   Body Site neck     What other symptoms do you have   Are you Having Heaviness or Tightness No   Are you having Pain Yes   Are you having pitting edema No   Is it Hard or Difficult finding clothes that fit No   Do you have infections No   Is there Decreased scar mobility No     Lymphedema Assessments   Lymphedema Assessments Head and Neck     Head and Neck   Right Corner of mouth to where ear lobe meets face 10.5 cm   Left Corner of mouth to where ear lobe meets face 10.5 cm   4 cm superior to sternal notch around neck 40.5 cm   6 cm superior to sternal notch around neck 41.5 cm   8 cm superior to sternal notch around neck 42 cm                        PT Education - 12/17/15 1636    Education provided Yes   Education Details anatomy of physiology of lymphatic system, educating on importance of walking and exercising   Person(s) Educated Patient   Methods Explanation   Comprehension Verbalized understanding                [facility] Goals - 12/17/15 1635      CC Long Term Goal  #1   Title Pt will be independent in manual lymphatic drainage for long term management of edema.   Time 4   Period Weeks   Status New     CC Long Term Goal  #2   Title Pt will be independent in a home exercise program for strengthening and endurance.    Time 4   Period Weeks   Status New     CC Long Term Goal  #3   Title Pt will receive appropriate compression garments for long term management of edema   Time 4   Period Weeks   Status New            Plan - 12/17/15 1630    Clinical Impression Statement Pt presents to PT with anterior neck lymphedema and fatigue. Pt reports he completed all  of his cancer treatments in May 2017 and his swelling  began a few weeks ago. His swelling is still soft and does not feel fibrotic. Pt is very motivated to get his swelling controlled. He also states he has been very fatigued lately and just does not have the stamina that he used to have. Pt would benefit from skilled PT services to manage her anterior neck edema and to get a home exercise program to help decrease fatigue.    Rehab Potential Excellent   Clinical Impairments Affecting Rehab Potential hx of radiation   PT Frequency 3x / week   PT Duration 4 weeks   PT Treatment/Interventions ADLs/Self Care Home Management;Manual techniques;Manual lymph drainage;Passive range of motion;Therapeutic exercise;Compression bandaging;Vasopneumatic Device;Taping   PT Next Visit Plan begin MLD to anterior neck and teach pt technique, give walking program and instruct pt on exercises to increase endurance   Consulted and Agree with Plan of Care Patient      Patient will benefit from skilled therapeutic intervention in order to improve the following deficits and impairments:  Pain, Decreased endurance, Increased edema  Visit Diagnosis: Lymphedema, not elsewhere classified - Plan: PT PLAN OF CARE CERT/RE-CERT  Muscle weakness (generalized) - Plan: PT PLAN OF CARE CERT/RE-CERT     Problem List Patient Active Problem List   Diagnosis Date Noted  . Dysuria 10/25/2015  . Decreased oral intake 10/25/2015  . Malnutrition of moderate degree 10/19/2015  . Antineoplastic chemotherapy induced pancytopenia ([place]) 10/18/2015  . Fever   . Candidiasis of mouth and esophagus ([place]) 10/17/2015  . Neutropenia, febrile ([place]) 10/17/2015  . Shingles rash 10/17/2015  . Nausea with vomiting 09/20/2015  . Hyponatremia 09/20/2015  . Oral pharyngeal candidiasis 09/20/2015  . Antineoplastic chemotherapy induced anemia 09/20/2015  . Hypertension   . Cirrhosis ([place])   . Portal hypertension ([place])   . Cancer of tonsillar fossa ([place]) 08/02/2015    [name] 12/17/2015, 4:58 PM  [place], [place], [address] Phone: [phone]   Fax:  [phone]  Name: Amonte Nutting MRN: [id number] Date of Birth: 05/15/1951  Allyson Sabal, PT 12/17/15 4:58 PM

## 2015-12-17 NOTE — Patient Instructions (Signed)
[place] at [facility] Discharge Instructions  RECOMMENDATIONS MADE BY THE CONSULTANT AND ANY TEST RESULTS WILL BE SENT TO YOUR REFERRING PHYSICIAN.  You saw Dr. Whitney Muse today.  Return to clinic in 2 weeks to see Dr. Whitney Muse.   Thank you for choosing [place] at [facility] to provide your oncology and hematology care.  To afford each patient quality time with our provider, please arrive at least 15 minutes before your scheduled appointment time.   Beginning January 23rd 2017 lab work for the [facility] will be done in the  Main lab at [facility] on 1st floor. If you have a lab appointment with the [facility] please come in thru the  Main Entrance and check in at the main information desk  You need to re-schedule your appointment should you arrive 10 or more minutes late.  We strive to give you quality time with our providers, and arriving late affects you and other patients whose appointments are after yours.  Also, if you no show three or more times for appointments you may be dismissed from the clinic at the providers discretion.     Again, thank you for choosing [facility].  Our hope is that these requests will decrease the amount of time that you wait before being seen by our physicians.       _____________________________________________________________  Should you have questions after your visit to [facility], please contact our office at [phone] between the hours of 8:30 a.m. and 4:30 p.m.  Voicemails left after 4:30 p.m. will not be returned until the following business day.  For prescription refill requests, have your pharmacy contact our office.         Resources For Cancer Patients and their Caregivers ? American Cancer Society: Can assist with transportation, wigs, general needs, runs Look Good Feel Better.        (949)701-9436 ? Cancer Care: Provides financial assistance, online  support groups, medication/co-pay assistance.  1-800-813-HOPE (508)124-4035) ? [place] Assists [place] cancer patients and their families through emotional , educational and financial support.  (559) 795-3422 ? [place] DSS Where to apply for food stamps, Medicaid and utility assistance. 705-503-7015 ? [facility]: Transportation to medical appointments. 540-581-2029 ? Social Security Administration: May apply for disability if have a Stage IV cancer. (947)485-6118 305 548 0235 ? [employer], Disability and Transit Services: Assists with nutrition, care and transit needs. [place] Support Programs: @10RELATIVEDAYS @ > Cancer Support Group  2nd Tuesday of the month 1pm-2pm, Journey Room  > Creative Journey  3rd Tuesday of the month 1130am-1pm, Journey Room  > Look Good Feel Better  1st Wednesday of the month 10am-12 noon, Journey Room (Call [place] to register [phone])

## 2015-12-17 NOTE — Progress Notes (Signed)
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 Frank Castillo is seen at [facility], Hepatitis C negative CT neck with contrast 06/25/2015 at [place] level 2 malignant LAD, largest node is 3.5 cm long axis. R worse than L carotid atherosclerosis and stenosis. CT chest with no acute or significant findings within the chest    Cancer of tonsillar fossa ([place])   08/02/2015 Initial Diagnosis    Cancer of tonsillar fossa ([place])     08/20/2015 Procedure    Port-a-cath placed by IR     08/20/2015 Procedure    G-tube placed by IR     08/21/2015 - 10/09/2015 Radiation Therapy    70 Gy, Dr. Isidore Moos.     08/21/2015 - 09/10/2015 Chemotherapy    Cisplatin every 21 days with XRT.     08/28/2015 Adverse Reaction    Tinnitis, Cisplatin-induced.     09/11/2015 - 09/30/2015 Chemotherapy    Carboplatin/5FU     09/20/2015 - 09/21/2015 Hospital Admission    Intractable nausea/vomiting. Severe Mucositis     10/17/2015 - 10/20/2015 Hospital Admission    Esophageal/oral candidasis. Shingles. Fever. Chemotherapy induced pancytopenia      HISTORY OF PRESENTING ILLNESS:  Frank Castillo 65 y.o. male is here for further follow-up of Stage IVA T2N2bM0 squamous cell Ca of the L tonsil. Left tonsillectomy performed on 07/09/2015 with Dr. Benjamine Castillo. Frank Castillo has completed therapy and is making a good recovery.   Frank Castillo is accompanied by his sister, Frank Castillo. I personally reviewed and went over laboratory studies with the patient.   Frank Castillo is doing well today. Frank Castillo notes mental struggles over the last week related to too much downtime without much to do. Frank Castillo admits Frank Castillo  has issues balancing resting time with active time. The Sunday before last, his sister was ready to call an ambulance believing Frank Castillo was dehydrated and not pushing enough fluids. By Wednesday, Frank Castillo was in tears. Frank Castillo is curious whether this is normal.   Reports the tinnitus is worse.   Frank Castillo brought his medications to review them. Frank Castillo takes Zyrtec daily by mixing the tablet with water.  Frank Castillo has not taken Ativan in 4 or 5 days. Admits Frank Castillo takes it more when his other sister is around as she increases his anxiety. Yesterday Frank Castillo did not take any of his liquid oxycodone until about 3 pm, and "was not ahead of the pain". 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  Frank Castillo is unable to sleep throughout the night secondary to nocturia. His insomnia has nothing to do with his pain, Frank Castillo notes that Frank Castillo may only have to take pain medication once through the night if at all. Frank Castillo takes Zofran when Frank Castillo begins to feel nauseous. Frank Castillo uses a fentanyl patch once every 3 days; Frank Castillo has almost completed the 25s, and has 12s at home.   Frank Castillo is moving his bowels once daily. Slowly increasing oral intake.  States Frank Castillo is in the mindset to quit smoking. Frank Castillo would like to quit. Frank Castillo previously used smoking cessation patches which helped him cut back however Frank Castillo did not completely quit - admitting Frank Castillo did nothing  else other than the patch.   Frank Castillo is curious if Frank Castillo can eat foods not fully cooked again. Frank Castillo would like to eat meat medium rare again, rather than fully cooked.    MEDICAL HISTORY:  Past Medical History:  Diagnosis Date  . Cirrhosis ([place])   . Hypertension   . Portal hypertension ([place])   . Tonsillar mass    left    SURGICAL HISTORY: Past Surgical History:  Procedure Laterality Date  . HERNIA REPAIR Right   . MOUTH SURGERY    . TONSILLECTOMY Left 07/09/2015   Procedure: LEFT TONSILLECTOMY;  Surgeon: Frank Baptist, MD;  Location: [facility];  Service: ENT;   Laterality: Left;    SOCIAL HISTORY: Social History   Social History  . Marital status: Single    Spouse name: N/A  . Number of children: 0  . Years of education: N/A   Occupational History  . Not on file.   Social History Main Topics  . Smoking status: Current Every Day Smoker    Packs/day: 0.50    Types: Cigarettes  . Smokeless tobacco: Never Used  . Alcohol use No     Comment: former abuse- quit 09/2011  . Drug use: No  . Sexual activity: Not on file   Other Topics Concern  . Not on file   Social History Narrative   Divorced.   No children.   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   Currently smoking approximately one pack per day.   Has been smoking off/on since age 55.   Divorced No children. Just step-children. 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 Previously worked in [employer]. Originally from [place], [place]. Current smoker at 1 ppd. Began smoking at [age] yo, regularly smoking at 14-21 yo. During his "cocaine years" Frank Castillo smoked about 2 ppd. ETOH, recovering alcoholic. 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 Frank Castillo enjoys reading. Frank Castillo is his mother's caregiver.  FAMILY HISTORY: Family History  Problem Relation Age of Onset  . Macular degeneration Mother   . Alcoholism Father    indicated that his mother is alive. Frank Castillo indicated that his father is deceased.    Mother still living at 59 years-old, with macular degeneration. She has no other health issues. Father deceased. Parents divorced at 42 years-old, so Frank Castillo is not sure what Frank Castillo died from. Father was an alcoholic. 4 sisters. 1 sister died of suicide. 1 sister was adopted. 1 Sister lives in [place]. 1 Sister lives in [place].  ALLERGIES:  is allergic to bee venom and other.  MEDICATIONS:  Current Outpatient Prescriptions  Medication Sig Dispense Refill  . cetirizine (ZYRTEC) 10 MG tablet Take 10 mg by mouth daily.    Marland Kitchen LORazepam (ATIVAN) 0.5 MG tablet Take 1 tablet (0.5 mg  total) by mouth every 6 (six) hours as needed for anxiety. 30 tablet 0  . metoCLOPramide (REGLAN) 5 MG tablet Take 1 tablet (5 mg total) by mouth 4 (four) times daily -  before meals and at bedtime. 240 tablet 1  . Nutritional Supplements (JEVITY 1.2 CAL/FIBER) LIQD 8 cans/day.  4 feeding of 2 cans each. Flush with 100 mls before and after each feeding 1000 mL 6  . ondansetron (ZOFRAN ODT) 8 MG disintegrating tablet Take 1 tablet (8 mg total) by mouth every 8 (eight) hours as needed for nausea or vomiting. 30 tablet 2  . oxyCODONE (ROXICODONE) 5 MG/5ML solution Take 5-10 mLs (5-10 mg total) by mouth every 4 (four)  hours as needed for severe pain. 250 mL 0  . cholecalciferol (VITAMIN D) 1000 units tablet Take 1,000 Units by mouth daily.    . fentaNYL (DURAGESIC - DOSED MCG/HR) 12 MCG/HR Place 1 patch (12.5 mcg total) onto the skin every 3 (three) days. (Patient not taking: Reported on 12/17/2015) 10 patch 0  . fentaNYL (DURAGESIC - DOSED MCG/HR) 25 MCG/HR patch Place 1 patch (25 mcg total) onto the skin every 3 (three) days. (Patient not taking: Reported on 12/17/2015) 10 patch 0  . sodium fluoride (FLUORISHIELD) 1.1 % GEL dental gel Instill one drop of gel per tooth space of fluoride tray. Place over teeth for 5 minutes. Remove. Spit out excess. Repeat nightly. 120 mL prn   No current facility-administered medications for this visit.    Facility-Administered Medications Ordered in Other Visits  Medication Dose Route Frequency Provider Last Rate Last Dose  . 0.9 %  sodium chloride infusion   Intravenous Once [employer], PA-C      . 0.9 %  sodium chloride infusion   Intravenous Continuous Manon Hilding Kefalas, PA-C      . 0.9 %  sodium chloride infusion   Intravenous Continuous Thomas S Kefalas, PA-C      . 0.9 %  sodium chloride infusion   Intravenous Continuous Shannon K Penland, MD      . 0.9 %  sodium chloride infusion   Intravenous Continuous Kelby Fam Penland, MD      . 0.9 %  sodium chloride  infusion   Intravenous Continuous Frank Ranks, MD        Review of Systems  Constitutional: Positive for malaise/fatigue. Negative for fever, chills.  HENT: Positive for mouth pain, tinnitus, and sore throat. Negative for congestion, hearing loss and nosebleeds.   Eyes: Negative.  Negative for blurred vision, double vision, pain and discharge.  Respiratory: . Negative for cough, hemoptysis, shortness of breath and wheezing.   Cardiovascular: Negative.  Negative for chest pain, palpitations, claudication, leg swelling and PND.  Gastrointestinal: Negative for heartburn, vomiting, abdominal pain, blood in stool and melena.  Genitourinary: Positive for nocturia.  Negative for dysuria, urgency, frequency and hematuria.  Musculoskeletal: Negative.  Negative for myalgias, joint pain and falls.  Skin: Negative. Neurological: Negative.  Negative for dizziness, tingling, tremors, sensory change, speech change, focal weakness, seizures, loss of consciousness, weakness and headaches.  Endo/Heme/Allergies: Negative.  Does not bruise/bleed easily.  Psychiatric/Behavioral: Positive for insomnia.  Negative for depression, suicidal ideas, memory loss and substance abuse. The patient is not nervous/anxious. Insomnia secondary to nocturia All other systems reviewed and are negative. 14 point ROS was done and is otherwise as detailed above or in HPI   PHYSICAL EXAMINATION: ECOG PERFORMANCE STATUS: 1 - Symptomatic but completely ambulatory   Vitals with BMI 12/17/2015  Height   Weight 155 lbs 6 oz  BMI   Systolic 163  Diastolic 77  Pulse 79  Respirations 18   Physical Exam  Constitutional: Frank Castillo is oriented to person, place, and time  and in no distress.  HENT:  Head: Normocephalic and atraumatic.  Nose: Nose normal.  Mouth/Throat: Thick secretions and dry mouth noted. Eyes: Conjunctivae and EOM are normal. Pupils are equal, round, and reactive to light. Right eye exhibits no discharge. Left eye  exhibits no discharge. No scleral icterus.  Neck: Normal range of motion. Neck supple. No tracheal deviation present. No thyromegaly present. Lymphedema noted. Cardiovascular: Normal rate, regular rhythm and normal heart sounds.  Exam reveals no  gallop and no friction rub.  Port site is C/D/I no erythema or bruising No murmur heard.  Pulmonary/Chest: Effort normal and breath sounds normal. Frank Castillo has no wheezes. Frank Castillo has no rales.  Abdominal: Soft. Bowel sounds are normal. Frank Castillo exhibits no distension and no mass. There is no tenderness. There is no rebound and no guarding. FT site is C/D/I, no erythema Musculoskeletal: Normal range of motion. Frank Castillo exhibits no edema.  Neurological: Frank Castillo is alert and oriented to person, place, and time. Frank Castillo has normal reflexes. No cranial nerve deficit. Gait normal. Coordination normal.  Skin: Skin is warm and dry.  Psychiatric: Mood, memory, affect and judgment normal.  Nursing note and vitals reviewed.   LABORATORY DATA:  I have reviewed the data as listed  Results for AKIO, HUDNALL (MRN [id number]) as of 12/17/2015 10:35  Ref. Range 12/17/2015 09:26  Sodium Latest Ref Range: 135 - 145 mmol/L 135  Potassium Latest Ref Range: 3.5 - 5.1 mmol/L 5.0  Chloride Latest Ref Range: 101 - 111 mmol/L 100 (L)  CO2 Latest Ref Range: 22 - 32 mmol/L 27  BUN Latest Ref Range: 6 - 20 mg/dL 15  Creatinine Latest Ref Range: 0.61 - 1.24 mg/dL 0.69  Calcium Latest Ref Range: 8.9 - 10.3 mg/dL 9.3  EGFR (Non-African Amer.) Latest Ref Range: >60 mL/min >60  EGFR (African American) Latest Ref Range: >60 mL/min >60  Glucose Latest Ref Range: 65 - 99 mg/dL 137 (H)  Anion gap Latest Ref Range: 5 - 15  8  Alkaline Phosphatase Latest Ref Range: 38 - 126 U/L 48  Albumin Latest Ref Range: 3.5 - 5.0 g/dL 4.0  AST Latest Ref Range: 15 - 41 U/L 14 (L)  ALT Latest Ref Range: 17 - 63 U/L 11 (L)  Total Protein Latest Ref Range: 6.5 - 8.1 g/dL 6.8  Total Bilirubin Latest Ref Range: 0.3 - 1.2 mg/dL  0.3  WBC Latest Ref Range: 4.0 - 10.5 K/uL 7.0  RBC Latest Ref Range: 4.22 - 5.81 MIL/uL 4.01 (L)  Hemoglobin Latest Ref Range: 13.0 - 17.0 g/dL 12.7 (L)  HCT Latest Ref Range: 39.0 - 52.0 % 38.7 (L)  MCV Latest Ref Range: 78.0 - 100.0 fL 96.5  MCH Latest Ref Range: 26.0 - 34.0 pg 31.7  MCHC Latest Ref Range: 30.0 - 36.0 g/dL 32.8  RDW Latest Ref Range: 11.5 - 15.5 % 14.2  Platelets Latest Ref Range: 150 - 400 K/uL 214  Neutrophils Latest Units: % 77  Lymphocytes Latest Units: % 12  Monocytes Relative Latest Units: % 8  Eosinophil Latest Units: % 2  Basophil Latest Units: % 1  NEUT# Latest Ref Range: 1.7 - 7.7 K/uL 5.4  Lymphocyte # Latest Ref Range: 0.7 - 4.0 K/uL 0.8  Monocyte # Latest Ref Range: 0.1 - 1.0 K/uL 0.6  Eosinophils Absolute Latest Ref Range: 0.0 - 0.7 K/uL 0.1  Basophils Absolute Latest Ref Range: 0.0 - 0.1 K/uL 0.1    RADIOGRAPHIC STUDIES: I have personally reviewed the radiological reports as listed.  Study Result   CLINICAL DATA:  Congestion, possible shingles  EXAM: PORTABLE CHEST 1 VIEW  COMPARISON:  None.  FINDINGS: Heart size mildly enlarged. Vascular pattern normal. Lungs clear. No pleural effusion. Right Port-A-Cath noted.  IMPRESSION: Mild cardiac silhouette enlargement.  Otherwise negative   Electronically Signed   By: Skipper Cliche M.D.   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 Frank Castillo is seen at [facility], Hepatitis C negative CT neck with contrast 06/25/2015 at [place] level 2 malignant LAD, largest node is 3.5 cm long axis. R worse than L carotid atherosclerosis and stenosis. CT chest with no acute or significant findings within the chest [facility] stay secondary to intractable n/v and mucositis Oropharyngeal candidiasis/esophageal  candidiasis Odynophagia Fever Shingles Chemotherapy induced pancytopenia Reactive depression Tinnitis  I spent a lot of time today encouraging the patient that Frank Castillo is making improvement from week to week. Frank Castillo has essentially stopped using ativan. Nausea is almost gone. Frank Castillo is increasing his oral intake and slowly decreasing his TF while maintaining his weight.  His short acting pain medication use has significantly declined. We will start decreasing his fentanyl at his next visit.   Laboratory results reviewed. Weights stable.  Frank Castillo is scheduled for a PET on 01/24/2016.  I have referred the patient for physical therapy / strength training. Frank Castillo will begin lymphedema therapy in [place] later today. I have encouraged him to try to walk daily early in the am.  I have written the patient a prescription for Wellbutrin. [facility]. Hopefully this will not only help with his mood but also with smoking cessation.   Frank Castillo will return for follow up in 2 weeks. If his mood has not improved by this next visit, we will discuss adding an SSRI. Will also assess smoking at follow-up.  All questions were answered. The patient knows to call the clinic with any problems, questions or concerns.  This document serves as a record of services personally performed by Ancil Linsey, MD. It was created on her behalf by Arlyce Harman, a trained medical scribe. The creation of this record is based on the scribe's personal observations and the provider's statements to them. This document has been checked and approved by the attending provider.  I have reviewed the above documentation for accuracy and completeness, and I agree with the above.  This note was electronically signed.    Molli Hazard, MD  12/17/2015 10:34 AM

## 2015-12-18 ENCOUNTER — Ambulatory Visit: Payer: BLUE CROSS/BLUE SHIELD | Admitting: Physical Therapy

## 2015-12-18 ENCOUNTER — Encounter (HOSPITAL_COMMUNITY): Payer: Self-pay | Admitting: Speech Pathology

## 2015-12-18 ENCOUNTER — Encounter: Payer: Self-pay | Admitting: Physical Therapy

## 2015-12-18 ENCOUNTER — Ambulatory Visit (HOSPITAL_COMMUNITY): Payer: BLUE CROSS/BLUE SHIELD | Attending: Hematology & Oncology | Admitting: Speech Pathology

## 2015-12-18 DIAGNOSIS — R1312 Dysphagia, oropharyngeal phase: Secondary | ICD-10-CM | POA: Insufficient documentation

## 2015-12-18 DIAGNOSIS — I890 Lymphedema, not elsewhere classified: Secondary | ICD-10-CM | POA: Diagnosis not present

## 2015-12-18 NOTE — Therapy (Signed)
Frank Castillo, [place], [address] Phone: [phone]   Fax:  [phone]  Speech Language Pathology Treatment  Patient Details  Name: Frank Castillo MRN: [id number] Date of Birth: Jul 19, 1950 No Data Recorded  Encounter Date: 12/18/2015      End of Session - 12/18/15 1646    Visit Number 4   Number of Visits 6   Authorization Type BCBS   Authorization Time Period through 01/26/2016   SLP Start Time 0906   SLP Stop Time  0957   SLP Time Calculation (min) 51 min   Activity Tolerance Patient tolerated treatment well      Past Medical History:  Diagnosis Date  . Cirrhosis ([place])   . Hypertension   . Portal hypertension ([place])   . Tonsillar mass    left    Past Surgical History:  Procedure Laterality Date  . HERNIA REPAIR Right   . MOUTH SURGERY    . TONSILLECTOMY Left 07/09/2015   Procedure: LEFT TONSILLECTOMY;  Surgeon: Leta Baptist, MD;  Location: [facility];  Service: ENT;  Laterality: Left;    There were no vitals filed for this visit.      Subjective Assessment - 12/18/15 1642    Subjective "I had a hard time last week."   Patient is accompained by: Family member  Sister, [name]   Currently in Pain? No/denies               ADULT SLP TREATMENT - 12/18/15 1643      General Information   Behavior/Cognition Alert;Cooperative;Pleasant mood   Patient Positioning Upright in chair   Oral care provided N/A   HPI Frank Castillo 65 y.o. male referred by Dr. Whitney Castillo who is undergoing treatment of Stage IVA squamous cell carcinoma of left tonsil (T2N2BM0) undergoing concomitant chemoradiation consisting Carboplatin/5FU after developing Cisplatin-induced tinnitus during cycle 1 of treatment; all in a curative intent. Pt with mucositis oral and oral candidiasis and treated with nystatin (MYCOSTATIN) 100000 UNIT/ML suspension. Pt will continue with IV fluids three times weekly. Left  tonsillectomy 07/09/2015 and PEG placed 08/20/2015. Pt anticipates that chemo will conclude ~10/22/2015 and radiation ~10/09/2015. Pt referred for swallowing evaluation due to risk of aspiration and malnutrition in setting of head and neck cancer treatment.      Treatment Provided   Treatment provided Dysphagia     Dysphagia Treatment   Temperature Spikes Noted No   Respiratory Status Room air   Oral Cavity - Dentition Adequate natural dentition   Treatment Methods Skilled observation;Therapeutic exercise;Compensation strategy training;Patient/caregiver education   Patient observed directly with PO's Yes   Type of PO's observed Thin liquids   Feeding Able to feed self   Liquids provided via Cup   Pharyngeal Phase Signs & Symptoms Multiple swallows   Type of cueing Verbal   Amount of cueing Modified independent     Pain Assessment   Pain Assessment No/denies pain     Assessment / Recommendations / Plan   Plan Continue with current plan of care     Dysphagia Recommendations   Diet recommendations Dysphagia 3 (mechanical soft);Thin liquid   Liquids provided via Cup;Straw   Medication Administration Whole meds with puree  Pt taking whole with water   Supervision Patient able to self feed   Compensations Slow rate;Small sips/bites;Multiple dry swallows after each bite/sip;Follow solids with liquid   Postural Changes and/or Swallow Maneuvers Out of bed for meals;Seated upright 90 degrees;Upright 30-60 min after meal  Progression Toward Goals   Progression toward goals Progressing toward goals          SLP Education - 12/18/15 1645    Education provided Yes   Education Details continue increasing po intake and record in journal (pt does well with a plan/structure)   Person(s) Educated Patient;Other (comment)  Sister   Methods Explanation   Comprehension Verbalized understanding          SLP Short Term Goals - 12/18/15 1647      SLP SHORT TERM GOAL #1   Title Pt will  complete swallowing exercises 3x/day 7 days a week with use of written cues and pt/caregiver report.    Baseline not completing   Time 2   Period Months   Status On-going     SLP SHORT TERM GOAL #2   Title Pt will increase oral intake of all consistencies and textures to regular/thin for 3 small meals a day to decrease dependency of PEG and facilitate safe swallow function going forward.    Baseline sipping liquids only   Time 2   Period Months   Status On-going          SLP Long Term Goals - 12/18/15 1647      SLP LONG TERM GOAL #1   Title Pt will demonstrate safe and efficient consumption of self regulated regular textures with use of compensatory strategies as needed.   Baseline Pt only drinking liquids at this time due to odynophagia   Time 2   Period Months   Status On-going          Plan - 12/18/15 1647    Clinical Impression Statement Frank Castillo was accompanied to today's appointment by his sister, [name]. Progress toward goals: 1) Pt will complete swallowing exercises 3x/day 7 days a week with use of written cues and pt/caregiver report. Pt completing exercises (CTAR and lingual strengthening) and continuation of trismus stretch. 2) Pt will increase oral intake of all consistencies and textures to regular/thin for 3 small meals a day to decrease dependency of PEG and facilitate safe swallow function going forward. Pt continues to keep food journal and has been more adventurous in trying new foods (celery with pimento cheese). He is starting to maintain weight so plan is to continue with TF regimen per RD and continue with PO intake. Pt reports a feeling of "hunger" yesterday, which is new for him. He was also started on Wellbutrin to help with smoking cessation and mood. He reports having a difficult few weeks emotionally. He was encouraged to follow a "plan" as he seems to do well with this for handling expectations. He admits to feeling somewhat "lost" now that treatment  has been completed, yet he is still in the healing phase and will continue to have repercussions from the treatment (ie. Radiation effects). He needs encouragement from his care team and will do well to continue with visits periodically. He has seen PT for lymphedema therapy and already notices an improvement in his neck swelling. Will space next visit out and have pt return in 4-6 weeks, but he understands that he should call if he notes any decline in swallow function.   Speech Therapy Frequency Monthly   Duration --  2 more visits every 4-6 weeks   Treatment/Interventions Aspiration precaution training;Pharyngeal strengthening exercises;Diet toleration management by SLP;SLP instruction and feedback;Compensatory strategies;Patient/family education   Potential to Achieve Goals Good   SLP Home Exercise Plan Pt will be independent with HEP as assigned to  facilitate carryover of treatment strategies at home   Consulted and Agree with Plan of Care Patient      Patient will benefit from skilled therapeutic intervention in order to improve the following deficits and impairments:   Dysphagia, oropharyngeal phase    Problem List Patient Active Problem List   Diagnosis Date Noted  . Dysuria 10/25/2015  . Decreased oral intake 10/25/2015  . Malnutrition of moderate degree 10/19/2015  . Antineoplastic chemotherapy induced pancytopenia ([place]) 10/18/2015  . Fever   . Candidiasis of mouth and esophagus ([place]) 10/17/2015  . Neutropenia, febrile ([place]) 10/17/2015  . Shingles rash 10/17/2015  . Nausea with vomiting 09/20/2015  . Hyponatremia 09/20/2015  . Oral pharyngeal candidiasis 09/20/2015  . Antineoplastic chemotherapy induced anemia 09/20/2015  . Hypertension   . Cirrhosis ([place])   . Portal hypertension ([place])   . Cancer of tonsillar fossa [facility]) 08/02/2015   Thank you,  Genene Churn, [place]  [facility] 12/18/2015, 4:49 PM  [facility] [address] [place], [place], [address] Phone: [phone]   Fax:  [phone]   Name: Frank Castillo MRN: [id number] Date of Birth: 09-30-1950

## 2015-12-18 NOTE — Therapy (Signed)
[place], [place], [address] Phone: [phone]   Fax:  [phone]  Physical Therapy Treatment  Patient Details  Name: Frank Castillo MRN: [id number] Date of Birth: May 16, 1951 Referring Provider: Whitney Muse  Encounter Date: 12/18/2015      PT End of Session - 12/18/15 1628    Visit Number 2   Number of Visits 13   Date for PT Re-Evaluation 01/14/16   PT Start Time [id number]   PT Stop Time 1525   PT Time Calculation (min) 46 min   Activity Tolerance Patient tolerated treatment well   Behavior During Therapy [facility] for tasks assessed/performed      Past Medical History:  Diagnosis Date  . Cirrhosis ([place])   . Hypertension   . Portal hypertension ([place])   . Tonsillar mass    left    Past Surgical History:  Procedure Laterality Date  . HERNIA REPAIR Right   . MOUTH SURGERY    . TONSILLECTOMY Left 07/09/2015   Procedure: LEFT TONSILLECTOMY;  Surgeon: Leta Baptist, MD;  Location: [facility];  Service: ENT;  Laterality: Left;    There were no vitals filed for this visit.      Subjective Assessment - 12/18/15 1446    Subjective I feel like my swelling is down some today compared to yesterday. After speech therapy this morning I felt nauseated but I feel better now.    Pertinent History Frank Castillo 65 y.o. male referred by Dr. 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 Yes   Pain Score 2    Pain Location Throat   Pain Orientation Left;Lateral;Medial   Pain Descriptors / Indicators Sore   Pain Type Surgical pain   Pain Onset More than a month ago               LYMPHEDEMA/ONCOLOGY QUESTIONNAIRE - 12/17/15 1417      Type   Cancer Type left tonsillar squamous cell carcinoma     Surgeries    Other Surgery Date 07/09/15     Date Lymphedema/Swelling Started   Date 11/15/15     Treatment   Active Chemotherapy Treatment No   Past Chemotherapy Treatment Yes   Date 10/09/15   Active Radiation Treatment No   Past Radiation Treatment Yes   Date 10/09/15   Body Site neck     What other symptoms do you have   Are you Having Heaviness or Tightness No   Are you having Pain Yes   Are you having pitting edema No   Is it Hard or Difficult finding clothes that fit No   Do you have infections No   Is there Decreased scar mobility No     Lymphedema Assessments   Lymphedema Assessments Head and Neck     Head and Neck   Right Corner of mouth to where ear lobe meets face 10.5 cm   Left Corner of mouth to where ear lobe meets face 10.5 cm   4 cm superior to sternal notch around neck 40.5 cm   6 cm superior to sternal notch around neck 41.5 cm   8 cm superior to sternal notch around neck 42 cm  [facility] Adult PT Treatment/Exercise - 12/18/15 0001      Manual Therapy   Manual Therapy Manual Lymphatic Drainage (MLD);Edema management   Edema Management created a chip bag for pt to wear around his head for management of anterior neck edema   Manual Lymphatic Drainage (MLD) short neck, bilateral axillary nodes, pectoral, shoulder collectors, then working up lateral neck to ears stretching skin downward x 2, then working from lateral neck to medial neck stretching skin back towards lateral neck then moving fluid down in different sections to cover entire neck- instructed pt and sister in technique throughout- also performed brief drainage of face though no edema present in this area                PT Education - 12/18/15 1627    Education provided Yes   Education Details self MLD to anterior neck   Person(s) Educated Patient;Other (comment)  sister   Methods Explanation;Demonstration;Tactile cues;Verbal cues   Comprehension Verbalized  understanding;Returned demonstration;Verbal cues required                [place] - 12/17/15 1635      CC Long Term Goal  #1   Title Pt will be independent in manual lymphatic drainage for long term management of edema.   Time 4   Period Weeks   Status New     CC Long Term Goal  #2   Title Pt will be independent in a home exercise program for strengthening and endurance.    Time 4   Period Weeks   Status New     CC Long Term Goal  #3   Title Pt will receive appropriate compression garments for long term management of edema   Time 4   Period Weeks   Status New            Plan - 12/18/15 1629    Clinical Impression Statement Pt and sister instructed in self manual lymphatic drainage technique for head and neck this visit. They took notes and pt was able to return demonstrate and verbalize correct technique and steps. Pt educated to do this twice a day. Created chip bag for pt to wear around head to help manage edema.    Rehab Potential Excellent   Clinical Impairments Affecting Rehab Potential hx of radiation   PT Frequency 3x / week   PT Duration 4 weeks   PT Treatment/Interventions ADLs/Self Care Home Management;Manual techniques;Manual lymph drainage;Passive range of motion;Therapeutic exercise;Compression bandaging;Vasopneumatic Device;Taping   PT Next Visit Plan continue MLD to anterior neck and assess pt for correct technique, give walking program and instruct pt on exercises to increase endurance   Consulted and Agree with Plan of Care Patient      Patient will benefit from skilled therapeutic intervention in order to improve the following deficits and impairments:  Pain, Decreased endurance, Increased edema  Visit Diagnosis: Lymphedema, not elsewhere classified     Problem List Patient Active Problem List   Diagnosis Date Noted  . Dysuria 10/25/2015  . Decreased oral intake 10/25/2015  . Malnutrition of moderate degree 10/19/2015  .  Antineoplastic chemotherapy induced pancytopenia ([place]) 10/18/2015  . Fever   . Candidiasis of mouth and esophagus ([place]) 10/17/2015  . Neutropenia, febrile ([place]) 10/17/2015  . Shingles rash 10/17/2015  . Nausea with vomiting 09/20/2015  . Hyponatremia 09/20/2015  . Oral pharyngeal candidiasis 09/20/2015  . Antineoplastic chemotherapy induced anemia 09/20/2015  . Hypertension   . Cirrhosis ([place])   .  Portal hypertension ([place])   . Cancer of tonsillar fossa ([place]) 08/02/2015    [name] 12/18/2015, 4:31 PM  [place], [place], [address] Phone: [phone]   Fax:  [phone]  Name: Frank Castillo MRN: [id number] Date of Birth: 1950-10-25   Allyson Sabal, PT 12/18/15 4:32 PM

## 2015-12-18 NOTE — Patient Instructions (Signed)
Manual Lymph Drainage for face/neck   1) Place hands on areas just above collar bones and do 15-20 stationary circles 2) Place hands on either side of neck and do 15-20 stationary circles  3) Do stationary circles at right armpit area (about 10 times) and the left armpit (about 10 times) 4) Standing at the head of the bed, do stationary circles on each side of the face at the jaw line (15-20) 5) Standing at the head of the bed, do stationary circles on each side of the face on the cheeks (15-20) 6) Standing at the head of the bed, do stationary circles on each side of the face between the eyes and ears (15-20) 7) Repeat step 2 8) Repeat step 1     Do not slide on the skin Only give enough pressure to stretch the skin Make sure to always wash your hands prior to massage 

## 2015-12-20 ENCOUNTER — Ambulatory Visit: Payer: BLUE CROSS/BLUE SHIELD | Admitting: Physical Therapy

## 2015-12-20 DIAGNOSIS — I890 Lymphedema, not elsewhere classified: Secondary | ICD-10-CM

## 2015-12-20 DIAGNOSIS — M6281 Muscle weakness (generalized): Secondary | ICD-10-CM

## 2015-12-20 NOTE — Patient Instructions (Addendum)
Sit in front of a mirror. Do 5 slow deep breaths, breathing in through the nose and out through the mouth, letting your belly "inflate" as you breathe in.  1) Place hands on areas just behind collar bones and do 10 stationary circles with stretch in outward directions. 2) Do stationary circles at each armpit about 10 times. 3) Place one hand on the front of the opposite shoulder and do stationary circles with stretch downward toward underarm. 4) Repeat #1 above. 5) Imagine a river runnning in a line from the earlobe straight down the neck.  Place hands just behind this and do circles with stretch coming forward slightly and down, thinking about fluid flowing down that river.  Do 10 times. 6) Place one hand just in front of the river on one side and do circles with a slight back and then downward stretch, thinking again about putting that fluid in the river.  Do 10-20 times on each side. 7) Place one hand just slightly in front of the spot you just did and do the same thing.  DO THIS VERY GENTLY. 8) Use the side of the fingers of your dominant hand to pump downward starting just under the chin and "stair stepping" downward with a stretch, working down to the chest. 9) Repeat #1. 10) Do stationary circles on each side of the face just above the chin, out and down with the stretch 10 times. 11) Do stationary circles on each side of the face on the cheeks with pressure going back and down, 10 times. 12) Do stationary circles on each side of the face between the eyes and ears, again back and downward 10 times. 13) Repeat steps 9,8,7,1,3 and 2 in that order!  Do not slide on the skin, but STRETCH it with your motions. Only give enough pressure to stretch the skin. Make sure to wash your hands prior to doing this. DO THIS SLOWLY, PLEASE!  And do once a day.   WALKING  Walking is a great form of exercise to increase your strength, endurance and overall fitness.  A walking program can help you  start slowly and gradually build endurance as you go.  Everyone's ability is different, so each person's starting point will be different.  You do not have to follow them exactly.  The are just samples. You should simply find out what's right for you and stick to that program.   In the beginning, you'll start off walking 2-3 times a day for short distances.  As you get stronger, you'll be walking further at just 1-2 times per day.  A. You Can Walk For A Certain Length Of Time Each Day    Walk 5 minutes 3 times per day.  Increase 2 minutes every 2 days (3 times per day).  Work up to 25-30 minutes (1-2 times per day).   Example:   Day 1-2 5 minutes 3 times per day   Day 7-8 12 minutes 2-3 times per day   Day 13-14 25 minutes 1-2 times per day  B. You Can Walk For a Certain Distance Each Day     Distance can be substituted for time.    Example:   3 trips to mailbox (at road)   3 trips to corner of block   3 trips around the block  C. Go to local [employer] and use the track.    Walk for distance ____ around track  Or time ____ minutes  D. Walk ____ [place]  ____ Run ___

## 2015-12-20 NOTE — Therapy (Signed)
[place], [place], [address] Phone: [phone]   Fax:  [phone]  Physical Therapy Treatment  Patient Details  Name: Argyle Shamrock MRN: [id number] Date of Birth: 1950-10-15 Referring Provider: Whitney Muse  Encounter Date: 12/20/2015      PT End of Session - 12/20/15 1225    Visit Number 3   Number of Visits 13   Date for PT Re-Evaluation 01/14/16   PT Start Time [id number]   PT Stop Time 1100   PT Time Calculation (min) 45 min   Activity Tolerance Patient tolerated treatment well   Behavior During Therapy [facility] for tasks assessed/performed      Past Medical History:  Diagnosis Date  . Cirrhosis ([place])   . Hypertension   . Portal hypertension ([place])   . Tonsillar mass    left    Past Surgical History:  Procedure Laterality Date  . HERNIA REPAIR Right   . MOUTH SURGERY    . TONSILLECTOMY Left 07/09/2015   Procedure: LEFT TONSILLECTOMY;  Surgeon: Leta Baptist, MD;  Location: [facility];  Service: ENT;  Laterality: Left;    There were no vitals filed for this visit.      Subjective Assessment - 12/20/15 1219    Subjective Pt and sister report they have found several youtube videos about how to do manual lymph draiange  Pt reports he has had some difficulty wearing the chip pack    Patient is accompained by: Family member   sister Jordan Hawks)    Pertinent History Juno Liden 65 y.o. male referred by Dr. 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 No/denies                         [facility] Adult PT Treatment/Exercise - 12/20/15 0001      Self-Care   Self-Care Other Self-Care Comments   Other Self-Care Comments  reviewed self manual lymph drianage and issued a written handout.  Pt  was able to demonstrate technique, but needed cues to decrease speed and lighten touch. discussed walking program      Manual Therapy   Manual Therapy Manual Lymphatic Drainage (MLD);Edema management   Manual Lymphatic Drainage (MLD) Reviewed and performed.(avoiding port in right neck ) short neck, bilateral axillary nodes, pectoral, shoulder collectors, then working up lateral neck to ears stretching skin downward x 2, then working from lateral neck to medial neck stretching skin back towards lateral neck then moving fluid down in different sections to cover entire neck-                PT Education - 12/20/15 1224    Education provided Yes   Education Details self manual lymph drainage and walking program    Person(s) Educated Patient;Other (comment)  sister   Methods Explanation;Demonstration;Handout   Comprehension Verbalized understanding;Returned demonstration;Need further instruction                [facility] Goals - 12/17/15 1635      CC Long Term Goal  #1   Title Pt will be independent in manual lymphatic drainage for long term management of edema.   Time 4   Period Weeks   Status New     CC Long Term  Goal  #2   Title Pt will be independent in a home exercise program for strengthening and endurance.    Time 4   Period Weeks   Status New     CC Long Term Goal  #3   Title Pt will receive appropriate compression garments for long term management of edema   Time 4   Period Weeks   Status New            Plan - 12/20/15 1226    Clinical Impression Statement Pt and sister have a good understanding of basics of manual lymph drianage and pt did well with self techniques with occasional verbal and hand over hand cues. He needs to light touch and slow down. Discussed chin straps and pt will look for one on the internet.  Pt is progressing well    Rehab Potential Excellent   Clinical Impairments Affecting Rehab Potential hx of radiation   PT Next  Visit Plan check to see if pt has started walking progam and if he was able to look at any commercially made chin straps. Review and perform manual lymph drianage. remeasure mid next week consider trial of kinesiontape if not progressing    Consulted and Agree with Plan of Care Patient      Patient will benefit from skilled therapeutic intervention in order to improve the following deficits and impairments:  Pain, Decreased endurance, Increased edema  Visit Diagnosis: Lymphedema, not elsewhere classified  Muscle weakness (generalized)     Problem List Patient Active Problem List   Diagnosis Date Noted  . Dysuria 10/25/2015  . Decreased oral intake 10/25/2015  . Malnutrition of moderate degree 10/19/2015  . Antineoplastic chemotherapy induced pancytopenia ([place]) 10/18/2015  . Fever   . Candidiasis of mouth and esophagus ([place]) 10/17/2015  . Neutropenia, febrile ([place]) 10/17/2015  . Shingles rash 10/17/2015  . Nausea with vomiting 09/20/2015  . Hyponatremia 09/20/2015  . Oral pharyngeal candidiasis 09/20/2015  . Antineoplastic chemotherapy induced anemia 09/20/2015  . Hypertension   . Cirrhosis ([place])   . Portal hypertension ([place])   . Cancer of tonsillar fossa ([place]) 08/02/2015   Donato Heinz. Owens Shark PT  Norwood Levo 12/20/2015, 12:30 PM  [place], [place], [address] Phone: [phone]   Fax:  [phone]  Name: Yafet Skees MRN: [id number] Date of Birth: 1951/04/11

## 2015-12-22 ENCOUNTER — Encounter (HOSPITAL_COMMUNITY): Payer: Self-pay | Admitting: Hematology & Oncology

## 2015-12-24 ENCOUNTER — Other Ambulatory Visit (HOSPITAL_COMMUNITY): Payer: Self-pay | Admitting: Emergency Medicine

## 2015-12-24 DIAGNOSIS — C090 Malignant neoplasm of tonsillar fossa: Secondary | ICD-10-CM

## 2015-12-24 MED ORDER — OXYCODONE HCL 5 MG/5ML PO SOLN: 5.0000 mg | ORAL | 0 refills | Status: DC | PRN

## 2015-12-25 ENCOUNTER — Encounter: Payer: Self-pay | Admitting: Physical Therapy

## 2015-12-25 ENCOUNTER — Ambulatory Visit: Payer: BLUE CROSS/BLUE SHIELD | Attending: Hematology & Oncology | Admitting: Physical Therapy

## 2015-12-25 DIAGNOSIS — I890 Lymphedema, not elsewhere classified: Secondary | ICD-10-CM | POA: Insufficient documentation

## 2015-12-25 DIAGNOSIS — M6281 Muscle weakness (generalized): Secondary | ICD-10-CM | POA: Diagnosis present

## 2015-12-25 NOTE — Therapy (Signed)
[place], [place], [address] Phone: [phone]   Fax:  [phone]  Physical Therapy Treatment  Patient Details  Name: Frank Castillo MRN: [id number] Date of Birth: 18-Sep-1950 Referring Provider: Whitney Muse  Encounter Date: 12/25/2015      PT End of Session - 12/25/15 1446    Visit Number 4   Number of Visits 13   Date for PT Re-Evaluation 01/14/16   PT Start Time [id number]   PT Stop Time 1430   PT Time Calculation (min) 42 min   Activity Tolerance Patient tolerated treatment well   Behavior During Therapy [facility] for tasks assessed/performed      Past Medical History:  Diagnosis Date  . Cirrhosis ([place])   . Hypertension   . Portal hypertension ([place])   . Tonsillar mass    left    Past Surgical History:  Procedure Laterality Date  . HERNIA REPAIR Right   . MOUTH SURGERY    . TONSILLECTOMY Left 07/09/2015   Procedure: LEFT TONSILLECTOMY;  Surgeon: Leta Baptist, MD;  Location: [facility];  Service: ENT;  Laterality: Left;    There were no vitals filed for this visit.      Subjective Assessment - 12/25/15 1353    Subjective I feel nauseated today. I am trying to do the massage twice a day. I don't know how well I am doing it. I sometimes wear the chip bag.   Pertinent History Frank Castillo 65 y.o. male referred by Dr. 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 Yes   Pain Score 4    Pain Location Throat   Pain Orientation Left;Lateral;Medial   Pain Descriptors / Indicators Sore                         [facility] Adult PT Treatment/Exercise - 12/25/15 0001      Manual Therapy   Manual Therapy Manual Lymphatic Drainage (MLD);Edema management   Manual Lymphatic Drainage (MLD) Reviewed  and performed.(avoiding port in right neck ) short neck, bilateral axillary nodes, pectoral, shoulder collectors, then working up lateral neck to ears stretching skin downward x 2, then working from lateral neck to medial neck stretching skin back towards lateral neck then moving fluid down in different sections to cover entire neck-                        [facility] Goals - 12/17/15 1635      CC Long Term Goal  #1   Title Pt will be independent in manual lymphatic drainage for long term management of edema.   Time 4   Period Weeks   Status New     CC Long Term Goal  #2   Title Pt will be independent in a home exercise program for strengthening and endurance.    Time 4   Period Weeks   Status New     CC Long Term Goal  #3   Title Pt will receive appropriate compression garments for long term management of edema   Time 4   Period Weeks   Status New            Plan - 12/25/15 1435    Clinical  Impression Statement Pt demonstrated significant improvement in edema following manual lymphatic drainage today. He stated "it's not even noticable," following the treatment session.    Rehab Potential Excellent   Clinical Impairments Affecting Rehab Potential hx of radiation   PT Frequency 3x / week   PT Duration 4 weeks   PT Treatment/Interventions ADLs/Self Care Home Management;Manual techniques;Manual lymph drainage;Passive range of motion;Therapeutic exercise;Compression bandaging;Vasopneumatic Device;Taping   PT Next Visit Plan check to see if pt has started walking progam and if he was able to look at any commercially made chin straps. Review and perform manual lymph drianage. remeasure mid next week consider trial of kinesiontape if not progressing    Consulted and Agree with Plan of Care Patient      Patient will benefit from skilled therapeutic intervention in order to improve the following deficits and impairments:  Pain, Decreased endurance, Increased  edema  Visit Diagnosis: Lymphedema, not elsewhere classified     Problem List Patient Active Problem List   Diagnosis Date Noted  . Dysuria 10/25/2015  . Decreased oral intake 10/25/2015  . Malnutrition of moderate degree 10/19/2015  . Antineoplastic chemotherapy induced pancytopenia ([place]) 10/18/2015  . Fever   . Candidiasis of mouth and esophagus ([place]) 10/17/2015  . Neutropenia, febrile ([place]) 10/17/2015  . Shingles rash 10/17/2015  . Nausea with vomiting 09/20/2015  . Hyponatremia 09/20/2015  . Oral pharyngeal candidiasis 09/20/2015  . Antineoplastic chemotherapy induced anemia 09/20/2015  . Hypertension   . Cirrhosis ([place])   . Portal hypertension ([place])   . Cancer of tonsillar fossa ([place]) 08/02/2015    [name] 12/25/2015, 2:47 PM  [place], [place], [address] Phone: [phone]   Fax:  [phone]  Name: Frank Castillo MRN: [id number] Date of Birth: [birth year]   Allyson Sabal, PT 12/25/15 2:47 PM

## 2015-12-27 ENCOUNTER — Encounter (HOSPITAL_COMMUNITY): Payer: BLUE CROSS/BLUE SHIELD | Attending: Hematology & Oncology | Admitting: Hematology & Oncology

## 2015-12-27 ENCOUNTER — Ambulatory Visit: Payer: BLUE CROSS/BLUE SHIELD | Admitting: Physical Therapy

## 2015-12-27 ENCOUNTER — Encounter (HOSPITAL_COMMUNITY): Payer: Self-pay | Admitting: Hematology & Oncology

## 2015-12-27 VITALS — BP 161/92 | HR 92 | Temp 98.2°F | Resp 18 | Wt 153.7 lb

## 2015-12-27 DIAGNOSIS — Z452 Encounter for adjustment and management of vascular access device: Secondary | ICD-10-CM

## 2015-12-27 DIAGNOSIS — I890 Lymphedema, not elsewhere classified: Secondary | ICD-10-CM | POA: Diagnosis not present

## 2015-12-27 DIAGNOSIS — R112 Nausea with vomiting, unspecified: Secondary | ICD-10-CM | POA: Diagnosis present

## 2015-12-27 DIAGNOSIS — Z95828 Presence of other vascular implants and grafts: Secondary | ICD-10-CM | POA: Diagnosis present

## 2015-12-27 DIAGNOSIS — F329 Major depressive disorder, single episode, unspecified: Secondary | ICD-10-CM

## 2015-12-27 DIAGNOSIS — R509 Fever, unspecified: Secondary | ICD-10-CM | POA: Diagnosis not present

## 2015-12-27 DIAGNOSIS — K117 Disturbances of salivary secretion: Secondary | ICD-10-CM

## 2015-12-27 DIAGNOSIS — K7460 Unspecified cirrhosis of liver: Secondary | ICD-10-CM

## 2015-12-27 DIAGNOSIS — L739 Follicular disorder, unspecified: Secondary | ICD-10-CM | POA: Insufficient documentation

## 2015-12-27 DIAGNOSIS — R682 Dry mouth, unspecified: Secondary | ICD-10-CM

## 2015-12-27 DIAGNOSIS — D61810 Antineoplastic chemotherapy induced pancytopenia: Secondary | ICD-10-CM

## 2015-12-27 DIAGNOSIS — M6281 Muscle weakness (generalized): Secondary | ICD-10-CM

## 2015-12-27 DIAGNOSIS — Z720 Tobacco use: Secondary | ICD-10-CM

## 2015-12-27 DIAGNOSIS — R05 Cough: Secondary | ICD-10-CM | POA: Insufficient documentation

## 2015-12-27 DIAGNOSIS — B370 Candidal stomatitis: Secondary | ICD-10-CM | POA: Diagnosis present

## 2015-12-27 DIAGNOSIS — C090 Malignant neoplasm of tonsillar fossa: Secondary | ICD-10-CM | POA: Diagnosis present

## 2015-12-27 DIAGNOSIS — C099 Malignant neoplasm of tonsil, unspecified: Secondary | ICD-10-CM | POA: Insufficient documentation

## 2015-12-27 LAB — CBC WITH DIFFERENTIAL/PLATELET
BASOS PCT: 1 %
Basophils Absolute: 0.1 10*3/uL (ref 0.0–0.1)
EOS ABS: 0.2 10*3/uL (ref 0.0–0.7)
EOS PCT: 3 %
HCT: 37.3 % — ABNORMAL LOW (ref 39.0–52.0)
HEMOGLOBIN: 12.3 g/dL — AB (ref 13.0–17.0)
LYMPHS ABS: 0.3 10*3/uL — AB (ref 0.7–4.0)
Lymphocytes Relative: 5 %
MCH: 31.6 pg (ref 26.0–34.0)
MCHC: 33 g/dL (ref 30.0–36.0)
MCV: 95.9 fL (ref 78.0–100.0)
MONO ABS: 0.8 10*3/uL (ref 0.1–1.0)
MONOS PCT: 13 %
NEUTROS PCT: 78 %
Neutro Abs: 5 10*3/uL (ref 1.7–7.7)
Platelets: 201 10*3/uL (ref 150–400)
RBC: 3.89 MIL/uL — ABNORMAL LOW (ref 4.22–5.81)
RDW: 14 % (ref 11.5–15.5)
WBC: 6.4 10*3/uL (ref 4.0–10.5)

## 2015-12-27 LAB — COMPREHENSIVE METABOLIC PANEL
ALBUMIN: 4.1 g/dL (ref 3.5–5.0)
ALK PHOS: 48 U/L (ref 38–126)
ALT: 12 U/L — ABNORMAL LOW (ref 17–63)
ANION GAP: 3 — AB (ref 5–15)
AST: 14 U/L — ABNORMAL LOW (ref 15–41)
BILIRUBIN TOTAL: 0.4 mg/dL (ref 0.3–1.2)
BUN: 13 mg/dL (ref 6–20)
CALCIUM: 9.1 mg/dL (ref 8.9–10.3)
CO2: 27 mmol/L (ref 22–32)
Chloride: 103 mmol/L (ref 101–111)
Creatinine, Ser: 0.62 mg/dL (ref 0.61–1.24)
GLUCOSE: 88 mg/dL (ref 65–99)
Potassium: 4.2 mmol/L (ref 3.5–5.1)
Sodium: 133 mmol/L — ABNORMAL LOW (ref 135–145)
TOTAL PROTEIN: 6.8 g/dL (ref 6.5–8.1)

## 2015-12-27 LAB — TSH: TSH: 1.338 u[IU]/mL (ref 0.350–4.500)

## 2015-12-27 LAB — T4, FREE: Free T4: 1.04 ng/dL (ref 0.61–1.12)

## 2015-12-27 MED ORDER — HEPARIN SOD (PORK) LOCK FLUSH 100 UNIT/ML IV SOLN
INTRAVENOUS | Status: AC
  Filled 2015-12-27: qty 5

## 2015-12-27 MED ORDER — DIPHENHYDRAMINE HCL 25 MG PO CAPS
ORAL_CAPSULE | ORAL | Status: AC
  Filled 2015-12-27: qty 2

## 2015-12-27 MED ORDER — ACETAMINOPHEN 325 MG PO TABS
ORAL_TABLET | ORAL | Status: AC
  Filled 2015-12-27: qty 2

## 2015-12-27 MED ORDER — LORAZEPAM 0.5 MG PO TABS: 0.5000 mg | ORAL_TABLET | Freq: Four times a day (QID) | ORAL | 0 refills | Status: DC | PRN

## 2015-12-27 MED ORDER — HEPARIN SOD (PORK) LOCK FLUSH 100 UNIT/ML IV SOLN
500.0000 [IU] | Freq: Once | INTRAVENOUS | Status: AC
  Administered 2015-12-27: 500 [IU] via INTRAVENOUS

## 2015-12-27 MED ORDER — PILOCARPINE HCL 5 MG PO TABS: 5.0000 mg | ORAL_TABLET | Freq: Three times a day (TID) | ORAL | 3 refills | Status: DC

## 2015-12-27 MED ORDER — SODIUM CHLORIDE 0.9% FLUSH
10.0000 mL | INTRAVENOUS | Status: DC | PRN
  Administered 2015-12-27: 10 mL via INTRAVENOUS
  Filled 2015-12-27: qty 10

## 2015-12-27 NOTE — Progress Notes (Signed)
Frank Castillo presented for Portacath access and flush. Portacath located right chest wall accessed with  H 20 needle. Good blood return present. Portacath flushed with 59ml NS and 500U/56ml Heparin and needle removed intact. Procedure without incident. Patient tolerated procedure well.  Labs drawn per orders.

## 2015-12-27 NOTE — Patient Instructions (Addendum)
[place] at [facility] Discharge Instructions  RECOMMENDATIONS MADE BY THE CONSULTANT AND ANY TEST RESULTS WILL BE SENT TO YOUR REFERRING PHYSICIAN.   Exam and discussion by Dr Whitney Muse today Port flush today Port flush every 8 weeks  Return to see the doctor in 2.5 weeks  Please call the clinic if you have any questions or concerns    Thank you for choosing [place] at [facility] to provide your oncology and hematology care.  To afford each patient quality time with our provider, please arrive at least 15 minutes before your scheduled appointment time.   Beginning January 23rd 2017 lab work for the [facility] will be done in the  Main lab at [facility] on 1st floor. If you have a lab appointment with the [facility] please come in thru the  Main Entrance and check in at the main information desk  You need to re-schedule your appointment should you arrive 10 or more minutes late.  We strive to give you quality time with our providers, and arriving late affects you and other patients whose appointments are after yours.  Also, if you no show three or more times for appointments you may be dismissed from the clinic at the providers discretion.     Again, thank you for choosing [facility].  Our hope is that these requests will decrease the amount of time that you wait before being seen by our physicians.       _____________________________________________________________  Should you have questions after your visit to [facility], please contact our office at [phone] between the hours of 8:30 a.m. and 4:30 p.m.  Voicemails left after 4:30 p.m. will not be returned until the following business day.  For prescription refill requests, have your pharmacy contact our office.         Resources For Cancer Patients and their Caregivers ? American Cancer Society: Can assist with transportation, wigs, general  needs, runs Look Good Feel Better.        832 768 6921 ? Cancer Care: Provides financial assistance, online support groups, medication/co-pay assistance.  1-800-813-HOPE (316)089-4275) ? [place] Assists [place] cancer patients and their families through emotional , educational and financial support.  769-457-1972 ? [place] DSS Where to apply for food stamps, Medicaid and utility assistance. 913-272-1046 ? [facility]: Transportation to medical appointments. 203-401-5314 ? Social Security Administration: May apply for disability if have a Stage IV cancer. 445-506-6461 540-262-3311 ? [employer], Disability and Transit Services: Assists with nutrition, care and transit needs. [place] Support Programs: @10RELATIVEDAYS @ > Cancer Support Group  2nd Tuesday of the month 1pm-2pm, Journey Room  > Creative Journey  3rd Tuesday of the month 1130am-1pm, Journey Room  > Look Good Feel Better  1st Wednesday of the month 10am-12 noon, Journey Room (Call [place] to register [phone])

## 2015-12-27 NOTE — Therapy (Signed)
[place], [place], [address] Phone: [phone]   Fax:  [phone]  Physical Therapy Treatment  Patient Details  Name: Frank Castillo MRN: [id number] Date of Birth: Mar 07, 1951 Referring Provider: Whitney Muse  Encounter Date: 12/27/2015      PT End of Session - 12/27/15 1250    Visit Number 5   Number of Visits 13   Date for PT Re-Evaluation 01/14/16   PT Start Time 0940   PT Stop Time 1020   PT Time Calculation (min) 40 min   Activity Tolerance Patient tolerated treatment well   Behavior During Therapy [facility] for tasks assessed/performed      Past Medical History:  Diagnosis Date  . Cirrhosis ([place])   . Hypertension   . Portal hypertension ([place])   . Tonsillar mass    left    Past Surgical History:  Procedure Laterality Date  . HERNIA REPAIR Right   . MOUTH SURGERY    . TONSILLECTOMY Left 07/09/2015   Procedure: LEFT TONSILLECTOMY;  Surgeon: Leta Baptist, MD;  Location: [facility];  Service: ENT;  Laterality: Left;    There were no vitals filed for this visit.      Subjective Assessment - 12/27/15 1246    Subjective Pt reports he had a bad day yesterday, but feels better today    Patient is accompained by: Family member  sister   Pertinent History Frank Castillo 65 y.o. male referred by Dr. 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 No/denies                         [facility] Adult PT Treatment/Exercise - 12/27/15 0001      Neck Exercises: Seated   Other Seated Exercise neck ROM and "kiss the sky"     Manual Therapy   Manual Lymphatic Drainage (MLD) Reviewed and performed.(avoiding port in right neck ) short neck, bilateral axillary nodes, pectoral, shoulder collectors,  then working up lateral neck to ears stretching skin downward x 2, then working from lateral neck to medial neck stretching skin back towards lateral neck then moving fluid down in different sections to cover entire neck-   Kinesiotex Edema     Kinesiotix   Edema skin kote applied, then Kinesiotapy in  3 pronged fan shape from anterior throat to posterior neck on both sides                 PT Education - 12/27/15 1249    Education provided Yes   Education Details remove Ktape at any sign of skin irritation by rolling, do not pull off    Person(s) Educated Patient   Methods Explanation   Comprehension Verbalized understanding                [facility] Goals - 12/27/15 1253      CC Long Term Goal  #1   Title Pt will be independent in manual lymphatic drainage for long term management of edema.   Status On-going     CC Long Term Goal  #2   Title Pt will be independent in a home exercise program for strengthening and endurance.    Status On-going     CC Long Term Goal  #  3   Title Pt will receive appropriate compression garments for long term management of edema   Status On-going            Plan - 12/27/15 1251    Clinical Impression Statement Pt reports he is having difficulty wearing chip pack and agreed to trial of ktape today. Reassured pt his efforts of doing self manual lymph drianage are adequate.  He says he is walking more and will try to look at chin straps on [employer] Potential Excellent   Clinical Impairments Affecting Rehab Potential hx of radiation   PT Next Visit Plan  assess effect of kinesiotape check  if he was able to look at any commercially made chin straps. Review and perform manual lymph drianage. remeasure mid next week    Consulted and Agree with Plan of Care Patient      Patient will benefit from skilled therapeutic intervention in order to improve the following deficits and impairments:  Pain, Decreased endurance,  Increased edema  Visit Diagnosis: Lymphedema, not elsewhere classified  Muscle weakness (generalized)     Problem List Patient Active Problem List   Diagnosis Date Noted  . Dysuria 10/25/2015  . Decreased oral intake 10/25/2015  . Malnutrition of moderate degree 10/19/2015  . Antineoplastic chemotherapy induced pancytopenia ([place]) 10/18/2015  . Fever   . Candidiasis of mouth and esophagus ([place]) 10/17/2015  . Neutropenia, febrile ([place]) 10/17/2015  . Shingles rash 10/17/2015  . Nausea with vomiting 09/20/2015  . Hyponatremia 09/20/2015  . Oral pharyngeal candidiasis 09/20/2015  . Antineoplastic chemotherapy induced anemia 09/20/2015  . Hypertension   . Cirrhosis ([place])   . Portal hypertension ([place])   . Cancer of tonsillar fossa ([place]) 08/02/2015   Donato Heinz. Owens Shark PT  Norwood Levo 12/27/2015, 12:54 PM  [place], [place], [address] Phone: [phone]   Fax:  [phone]  Name: Frank Castillo MRN: [id number] Date of Birth: Sep 25, 1950

## 2015-12-27 NOTE — Progress Notes (Signed)
[place] [facility] [place] NOTE  Patient Care Team: Celedonio Savage, MD as PCP - General (Family Medicine) Eppie Gibson, MD as Attending Physician (Radiation Oncology) Patrici Ranks, MD as Consulting Physician (Hematology and Oncology)  CHIEF COMPLAINTS:  Invasive squamous cell carcinoma of the Left tonsil, moderately differentiated, p16 positive Stage IVA T2N2bM0 Cirrhosis, portal hypertension. He is seen at [facility], Hepatitis C negative CT neck with contrast 06/25/2015 at [facility] level 2 malignant LAD, largest node is 3.5 cm long axis. R worse than L carotid atherosclerosis and stenosis. CT chest with no acute or significant findings within the chest    Cancer of tonsillar fossa ([place])   08/02/2015 Initial Diagnosis    Cancer of tonsillar fossa ([place])     08/20/2015 Procedure    Port-a-cath placed by IR     08/20/2015 Procedure    G-tube placed by IR     08/21/2015 - 10/09/2015 Radiation Therapy    70 Gy, Dr. Isidore Moos.     08/21/2015 - 09/10/2015 Chemotherapy    Cisplatin every 21 days with XRT.     08/28/2015 Adverse Reaction    Tinnitis, Cisplatin-induced.     09/11/2015 - 09/30/2015 Chemotherapy    Carboplatin/5FU     09/20/2015 - 09/21/2015 Hospital Admission    Intractable nausea/vomiting. Severe Mucositis     10/17/2015 - 10/20/2015 Hospital Admission    Esophageal/oral candidasis. Shingles. Fever. Chemotherapy induced pancytopenia      HISTORY OF PRESENTING ILLNESS:  Frank Castillo 65 y.o. male is here for further follow-up of Stage IVA T2N2bM0 squamous cell Ca of the L tonsil. Left tonsillectomy performed on 07/09/2015 with Dr. Benjamine Mola. He has completed therapy and is making a good recovery.   Frank Castillo is accompanied by his sister, [name].    He continues to have issues with dry mouth causing food to feel as though it is getting bigger and bigger as he chews. He drinks water, is using biotene. He has also tried various other dry mouth  remedies including hard tack candies but nothing seems to help.   He may be becoming agitated after taking the Wellbutrin. He and his sister both note a "personality" change since starting the drug, mostly impatience.   He continues to smoke. Pain continues to improve. He is gradually walking more.    MEDICAL HISTORY:  Past Medical History:  Diagnosis Date  . Cirrhosis ([place])   . Hypertension   . Portal hypertension ([place])   . Tonsillar mass    left    SURGICAL HISTORY: Past Surgical History:  Procedure Laterality Date  . HERNIA REPAIR Right   . MOUTH SURGERY    . TONSILLECTOMY Left 07/09/2015   Procedure: LEFT TONSILLECTOMY;  Surgeon: Leta Baptist, MD;  Location: [facility];  Service: ENT;  Laterality: Left;    SOCIAL HISTORY: Social History   Social History  . Marital status: Single    Spouse name: N/A  . Number of children: 0  . Years of education: N/A   Occupational History  . Not on file.   Social History Main Topics  . Smoking status: Current Every Day Smoker    Packs/day: 0.50    Types: Cigarettes  . Smokeless tobacco: Never Used  . Alcohol use No     Comment: former abuse- quit 09/2011  . Drug use: No  . Sexual activity: Not on file   Other Topics Concern  . Not on file   Social History Narrative  Divorced.   No children.   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   Currently smoking approximately one pack per day.   Has been smoking off/on since age 41.   Divorced No children. Just step-children. 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 Previously worked in [employer]. Originally from [place], [place]. Current smoker at 1 ppd. Began smoking at 29-15 yo, regularly smoking at 2-19 yo. During his "cocaine years" he smoked about 2 ppd. ETOH, recovering alcoholic. 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 He enjoys reading. 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  FAMILY HISTORY: Family History  Problem Relation Age of  Onset  . Macular degeneration Mother   . Alcoholism Father    indicated that his mother is alive. He indicated that his father is deceased.    Mother still living at 8 years-old, with macular degeneration. She has no other health issues. Father deceased. Parents divorced at 49 years-old, so he is not sure what he died from. Father was an alcoholic. 4 sisters. 1 sister died of suicide. 1 sister was adopted. 1 Sister lives in [place]. 1 Sister lives in [place].  ALLERGIES:  is allergic to bee venom and other.  MEDICATIONS:  Current Outpatient Prescriptions  Medication Sig Dispense Refill  . buPROPion (WELLBUTRIN XL) 150 MG 24 hr tablet Take once daily in the am, after 5 days increase to two tablets 60 tablet 3  . cetirizine (ZYRTEC) 10 MG tablet Take 10 mg by mouth daily.    . cholecalciferol (VITAMIN D) 1000 units tablet Take 1,000 Units by mouth daily.    . fentaNYL (DURAGESIC - DOSED MCG/HR) 12 MCG/HR Place 1 patch (12.5 mcg total) onto the skin every 3 (three) days. (Patient not taking: Reported on 12/17/2015) 10 patch 0  . fentaNYL (DURAGESIC - DOSED MCG/HR) 25 MCG/HR patch Place 1 patch (25 mcg total) onto the skin every 3 (three) days. (Patient not taking: Reported on 12/17/2015) 10 patch 0  . LORazepam (ATIVAN) 0.5 MG tablet Take 1 tablet (0.5 mg total) by mouth every 6 (six) hours as needed for anxiety. 30 tablet 0  . metoCLOPramide (REGLAN) 5 MG tablet Take 1 tablet (5 mg total) by mouth 4 (four) times daily -  before meals and at bedtime. 240 tablet 1  . Nutritional Supplements (JEVITY 1.2 CAL/FIBER) LIQD 8 cans/day.  4 feeding of 2 cans each. Flush with 100 mls before and after each feeding 1000 mL 6  . ondansetron (ZOFRAN ODT) 8 MG disintegrating tablet Take 1 tablet (8 mg total) by mouth every 8 (eight) hours as needed for nausea or vomiting. 30 tablet 2  . oxyCODONE (ROXICODONE) 5 MG/5ML solution Take 5-10 mLs (5-10 mg total) by mouth every 4 (four) hours as needed for severe pain.  250 mL 0  . sodium fluoride (FLUORISHIELD) 1.1 % GEL dental gel Instill one drop of gel per tooth space of fluoride tray. Place over teeth for 5 minutes. Remove. Spit out excess. Repeat nightly. 120 mL prn   No current facility-administered medications for this visit.    Facility-Administered Medications Ordered in Other Visits  Medication Dose Route Frequency Provider Last Rate Last Dose  . 0.9 %  sodium chloride infusion   Intravenous Once [employer], PA-C      . 0.9 %  sodium chloride infusion   Intravenous Continuous Manon Hilding Kefalas, PA-C      . 0.9 %  sodium chloride infusion   Intravenous Continuous Baird Cancer, PA-C      .  0.9 %  sodium chloride infusion   Intravenous Continuous Shannon K Penland, MD      . 0.9 %  sodium chloride infusion   Intravenous Continuous Kelby Fam Penland, MD      . 0.9 %  sodium chloride infusion   Intravenous Continuous Patrici Ranks, MD        Review of Systems  Constitutional: Positive for malaise/fatigue. Negative for fever, chills.  HENT: Positive for mouth pain, dry mouth, tinnitus, and sore throat. Negative for congestion, hearing loss and nosebleeds.   Eyes: Negative.  Negative for blurred vision, double vision, pain and discharge.  Respiratory: . Negative for cough, hemoptysis, shortness of breath and wheezing.   Cardiovascular: Negative.  Negative for chest pain, palpitations, claudication, leg swelling and PND.  Gastrointestinal: Negative for heartburn, vomiting, abdominal pain, blood in stool and melena.  Genitourinary: Positive for nocturia.  Negative for dysuria, urgency, frequency and hematuria.  Musculoskeletal: Negative.  Negative for myalgias, joint pain and falls.  Skin: Negative. Neurological: Negative.  Negative for dizziness, tingling, tremors, sensory change, speech change, focal weakness, seizures, loss of consciousness, weakness and headaches.  Endo/Heme/Allergies: Negative.  Does not bruise/bleed easily.    Psychiatric/Behavioral: Positive for insomnia.  Negative for depression, suicidal ideas, memory loss and substance abuse. The patient is not nervous/anxious. All other systems reviewed and are negative. 14 point ROS was done and is otherwise as detailed above or in HPI   PHYSICAL EXAMINATION: ECOG PERFORMANCE STATUS: 1 - Symptomatic but completely ambulatory   Vitals with BMI 12/27/2015  Height   Weight 153 lbs 11 oz  BMI   Systolic 161  Diastolic 92  Pulse 92  Respirations 18    Physical Exam  Constitutional: He is oriented to person, place, and time  and in no distress.  HENT:  Head: Normocephalic and atraumatic.  Nose: Nose normal.  Mouth/Throat: dry mouth noted. Eyes: Conjunctivae and EOM are normal. Pupils are equal, round, and reactive to light. Right eye exhibits no discharge. Left eye exhibits no discharge. No scleral icterus.  Neck: Normal range of motion. Neck supple. No tracheal deviation present. No thyromegaly present. Lymphedema noted. Cardiovascular: Normal rate, regular rhythm and normal heart sounds.  Exam reveals no gallop and no friction rub.  Port site is C/D/I no erythema or bruising No murmur heard.  Pulmonary/Chest: Effort normal and breath sounds normal. He has no wheezes. He has no rales.  Abdominal: Soft. Bowel sounds are normal. He exhibits no distension and no mass. There is no tenderness. There is no rebound and no guarding. FT site is C/D/I, no erythema Musculoskeletal: Normal range of motion. He exhibits no edema.  Neurological: He is alert and oriented to person, place, and time. He has normal reflexes. No cranial nerve deficit. Gait normal. Coordination normal.  Skin: Skin is warm and dry.  Psychiatric: Mood, memory, affect and judgment normal.  Nursing note and vitals reviewed.   LABORATORY DATA:  I have reviewed the data as listed Results for JABRON, WEESE (MRN [id number])   Ref. Range 12/17/2015 09:26  Sodium Latest Ref Range: 135 - 145  mmol/L 135  Potassium Latest Ref Range: 3.5 - 5.1 mmol/L 5.0  Chloride Latest Ref Range: 101 - 111 mmol/L 100 (L)  CO2 Latest Ref Range: 22 - 32 mmol/L 27  BUN Latest Ref Range: 6 - 20 mg/dL 15  Creatinine Latest Ref Range: 0.61 - 1.24 mg/dL 0.69  Calcium Latest Ref Range: 8.9 - 10.3 mg/dL 9.3  EGFR (Non-African Amer.)  Latest Ref Range: >60 mL/min >60  EGFR (African American) Latest Ref Range: >60 mL/min >60  Glucose Latest Ref Range: 65 - 99 mg/dL 137 (H)  Anion gap Latest Ref Range: 5 - 15  8  Alkaline Phosphatase Latest Ref Range: 38 - 126 U/L 48  Albumin Latest Ref Range: 3.5 - 5.0 g/dL 4.0  AST Latest Ref Range: 15 - 41 U/L 14 (L)  ALT Latest Ref Range: 17 - 63 U/L 11 (L)  Total Protein Latest Ref Range: 6.5 - 8.1 g/dL 6.8  Total Bilirubin Latest Ref Range: 0.3 - 1.2 mg/dL 0.3  WBC Latest Ref Range: 4.0 - 10.5 K/uL 7.0  RBC Latest Ref Range: 4.22 - 5.81 MIL/uL 4.01 (L)  Hemoglobin Latest Ref Range: 13.0 - 17.0 g/dL 12.7 (L)  HCT Latest Ref Range: 39.0 - 52.0 % 38.7 (L)  MCV Latest Ref Range: 78.0 - 100.0 fL 96.5  MCH Latest Ref Range: 26.0 - 34.0 pg 31.7  MCHC Latest Ref Range: 30.0 - 36.0 g/dL 32.8  RDW Latest Ref Range: 11.5 - 15.5 % 14.2  Platelets Latest Ref Range: 150 - 400 K/uL 214  Neutrophils Latest Units: % 77  Lymphocytes Latest Units: % 12  Monocytes Relative Latest Units: % 8  Eosinophil Latest Units: % 2  Basophil Latest Units: % 1  NEUT# Latest Ref Range: 1.7 - 7.7 K/uL 5.4  Lymphocyte # Latest Ref Range: 0.7 - 4.0 K/uL 0.8  Monocyte # Latest Ref Range: 0.1 - 1.0 K/uL 0.6  Eosinophils Absolute Latest Ref Range: 0.0 - 0.7 K/uL 0.1  Basophils Absolute Latest Ref Range: 0.0 - 0.1 K/uL 0.1     RADIOGRAPHIC STUDIES: I have personally reviewed the radiological reports as listed.  Study Result   CLINICAL DATA:  Congestion, possible shingles  EXAM: PORTABLE CHEST 1 VIEW  COMPARISON:  None.  FINDINGS: Heart size mildly enlarged. Vascular pattern  normal. Lungs clear. No pleural effusion. Right Port-A-Cath noted.  IMPRESSION: Mild cardiac silhouette enlargement.  Otherwise negative   Electronically Signed   By: Skipper Cliche M.D.   On: 10/17/2015 20:37    PATHOLOGY   ASSESSMENT & PLAN:  Invasive squamous cell carcinoma of the Left tonsil, moderately differentiated, p16 positive Cirrhosis, portal hypertension. He is seen at [facility], Hepatitis C negative CT neck with contrast 06/25/2015 at [facility] level 2 malignant LAD, largest node is 3.5 cm long axis. R worse than L carotid atherosclerosis and stenosis. CT chest with no acute or significant findings within the chest [facility] stay secondary to intractable n/v and mucositis Oropharyngeal candidiasis/esophageal candidiasis Odynophagia Fever Shingles Chemotherapy induced pancytopenia Reactive depression Tinnitis Xerostomia   He is scheduled for a PET scan on 01/24/16.  The Wellbutrin may be making him agitated. We had chosen wellbutrin for his mood because of his ongoing smoking, hoping it would help him quit. I am going to change him to lexapro. He is agreeable.   Fentanyl down to 31mg.. I have refilled his oxycodone solution.  I have written for salagen. We did review side effects.   He will return for follow up in about 2 weeks.   Orders Placed This Encounter  Procedures  . CBC with Differential    Standing Status:   Future    Number of Occurrences:   1    Standing Expiration Date:   12/26/2016  . Comprehensive metabolic panel    Standing Status:   Future    Number of Occurrences:   1  Standing Expiration Date:   12/26/2016  . TSH    Standing Status:   Future    Number of Occurrences:   1    Standing Expiration Date:   12/26/2016  . T4, free    All questions were answered. The patient knows to call the clinic with any problems, questions or concerns.  This document serves as a record of services personally performed by  Ancil Linsey, MD. It was created on her behalf by Arlyce Harman, a trained medical scribe. The creation of this record is based on the scribe's personal observations and the provider's statements to them. This document has been checked and approved by the attending provider.  I have reviewed the above documentation for accuracy and completeness, and I agree with the above.  This note was electronically signed.    Molli Hazard, MD  12/27/2015 8:20 AM

## 2015-12-30 ENCOUNTER — Encounter: Payer: Self-pay | Admitting: Physical Therapy

## 2015-12-30 ENCOUNTER — Telehealth (HOSPITAL_COMMUNITY): Payer: Self-pay | Admitting: Hematology & Oncology

## 2015-12-30 ENCOUNTER — Ambulatory Visit: Payer: BLUE CROSS/BLUE SHIELD | Admitting: Physical Therapy

## 2015-12-30 DIAGNOSIS — I890 Lymphedema, not elsewhere classified: Secondary | ICD-10-CM | POA: Diagnosis not present

## 2015-12-30 NOTE — Therapy (Signed)
[place], [place], [address] Phone: [phone]   Fax:  [phone]  Physical Therapy Treatment  Patient Details  Name: Frank Castillo MRN: [id number] Date of Birth: Sep 26, 1950 Referring Provider: Whitney Muse  Encounter Date: 12/30/2015      PT End of Session - 12/30/15 1706    Visit Number 6   Number of Visits 13   Date for PT Re-Evaluation 01/14/16   PT Start Time 1525   PT Stop Time 1610   PT Time Calculation (min) 45 min   Activity Tolerance Patient tolerated treatment well   Behavior During Therapy [facility] for tasks assessed/performed      Past Medical History:  Diagnosis Date  . Cirrhosis ([place])   . Hypertension   . Portal hypertension ([place])   . Tonsillar mass    left    Past Surgical History:  Procedure Laterality Date  . HERNIA REPAIR Right   . MOUTH SURGERY    . TONSILLECTOMY Left 07/09/2015   Procedure: LEFT TONSILLECTOMY;  Surgeon: Leta Baptist, MD;  Location: [facility];  Service: ENT;  Laterality: Left;    There were no vitals filed for this visit.      Subjective Assessment - 12/30/15 1526    Subjective Pt reports he is doing the massage some and does feel his swelling is improving.    Patient is accompained by: Family member   Pertinent History Frank Castillo 65 y.o. male referred by Dr. 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 No/denies   Pain Score 0-No pain                         [facility] Adult PT Treatment/Exercise - 12/30/15 0001      Manual Therapy   Manual Lymphatic Drainage (MLD) short neck, bilateral axillary nodes, pectoral, shoulder collectors, then working up lateral neck to ears stretching skin downward x 2, then working from lateral neck to  medial neck stretching skin back towards lateral neck then moving fluid down in different sections to cover entire neck- instructed pt and sister in technique throughout- also performed brief drainage of face though no edema present in this area   Kinesiotex Edema     Kinesiotix   Edema Kinesiotape in  3 pronged fan shape from anterior throat to posterior neck on both sides                 PT Education - 12/30/15 1705    Education provided Yes   Education Details educated pt to remove Ktape at any signs of irritation, educated pt about commercially available chin strap and gave pt information to obtain   [employer]) Educated Patient;Other (comment)  sister   Methods Explanation;Handout   Comprehension Verbalized understanding                Long Term Clinic Goals - 12/27/15 1253      CC Long Term Goal  #1   Title Pt will be independent in manual lymphatic drainage for long term management of edema.   Status On-going     CC Long Term Goal  #2   Title Pt will be independent in a home exercise program for strengthening and endurance.    Status On-going  CC Long Term Goal  #3   Title Pt will receive appropriate compression garments for long term management of edema   Status On-going            Plan - 12/30/15 1706    Clinical Impression Statement Pt states the kinesiotape did help keep his edema under control but he wasnt able to tolerate it overnight. Pt has been making efforts at self manual lymphatic drainage. He was given info today regarding a jovi pack head and neck garment.    Rehab Potential Excellent   Clinical Impairments Affecting Rehab Potential hx of radiation   PT Frequency 3x / week   PT Duration 4 weeks   PT Treatment/Interventions ADLs/Self Care Home Management;Manual techniques;Manual lymph drainage;Passive range of motion;Therapeutic exercise;Compression bandaging;Vasopneumatic Device;Taping   PT Next Visit Plan assess how long pt was able  to tolerate kinesiotape, assess if pt received or looked into purchasing head and neck garment   Consulted and Agree with Plan of Care Patient      Patient will benefit from skilled therapeutic intervention in order to improve the following deficits and impairments:  Pain, Decreased endurance, Increased edema  Visit Diagnosis: Lymphedema, not elsewhere classified     Problem List Patient Active Problem List   Diagnosis Date Noted  . Dysuria 10/25/2015  . Decreased oral intake 10/25/2015  . Malnutrition of moderate degree 10/19/2015  . Antineoplastic chemotherapy induced pancytopenia ([place]) 10/18/2015  . Fever   . Candidiasis of mouth and esophagus ([place]) 10/17/2015  . Neutropenia, febrile ([place]) 10/17/2015  . Shingles rash 10/17/2015  . Nausea with vomiting 09/20/2015  . Hyponatremia 09/20/2015  . Oral pharyngeal candidiasis 09/20/2015  . Antineoplastic chemotherapy induced anemia 09/20/2015  . Hypertension   . Cirrhosis ([place])   . Portal hypertension ([place])   . Cancer of tonsillar fossa ([place]) 08/02/2015    [name] 12/30/2015, 5:10 PM  [place], [place], [address] Phone: [phone]   Fax:  [phone]  Name: Frank Castillo MRN: [id number] Date of Birth: 1950/08/20   Allyson Sabal, PT 12/30/15 5:11 PM

## 2015-12-30 NOTE — Telephone Encounter (Signed)
[facility] APPROVAL FOR [phone] 09/11/15-12/04/15 AUTH# [id number]

## 2016-01-01 ENCOUNTER — Ambulatory Visit: Payer: BLUE CROSS/BLUE SHIELD | Admitting: Physical Therapy

## 2016-01-01 DIAGNOSIS — I890 Lymphedema, not elsewhere classified: Secondary | ICD-10-CM | POA: Diagnosis not present

## 2016-01-01 NOTE — Therapy (Signed)
[place], [place], [address] Phone: [phone]   Fax:  [phone]  Physical Therapy Treatment  Patient Details  Name: Frank Castillo MRN: [id number] Date of Birth: 22-Dec-1950 Referring Provider: Whitney Muse  Encounter Date: 01/01/2016      PT End of Session - 01/01/16 1726    Visit Number 7   Number of Visits 13   Date for PT Re-Evaluation 01/14/16   PT Start Time 1430   PT Stop Time 1525   PT Time Calculation (min) 55 min   Activity Tolerance Patient tolerated treatment well   Behavior During Therapy [facility] for tasks assessed/performed      Past Medical History:  Diagnosis Date  . Cirrhosis ([place])   . Hypertension   . Portal hypertension ([place])   . Tonsillar mass    left    Past Surgical History:  Procedure Laterality Date  . HERNIA REPAIR Right   . MOUTH SURGERY    . TONSILLECTOMY Left 07/09/2015   Procedure: LEFT TONSILLECTOMY;  Surgeon: Leta Baptist, MD;  Location: [place];  Service: ENT;  Laterality: Left;    There were no vitals filed for this visit.      Subjective Assessment - 01/01/16 1433    Subjective Nothing new.  Will be meeting with the [place] next Tuesday about that; the rep said that he is covered for that.  Not sure yet about the [facility] Adult PT Treatment/Exercise - 01/01/16 0001      Self-Care   Other Self-Care Comments  Educated patient further about neck compression garment options, showing patient two samples (Jovi and Solaris) and showing him pictures in a notebook of other options.  Discussed with patient that he could learn to do Kinesiotaping himself and instructed him in this today.     Manual Therapy   Manual Lymphatic Drainage (MLD) In sitting:  supraclavicular fossae, bilateral axillae, bilat. pectoral nodes, bilat. shoulder collectors.  In recline on elevated mat: supraclavicular fossae,  posterolateral, lateral, anteriolateral, and anterior neck; chin and cheeks; then reinforcing pathways down to axillae.   Kinesiotex Edema  fan shape anterior neck to supraclavicular fossae bilat.                PT Education - 01/01/16 1725    Education provided Yes   Education Details about variety of neck compression garment options; about Kinesiotaping himself   Person(s) Educated Patient   Methods Explanation;Demonstration   Comprehension Verbalized understanding;Returned demonstration  returned demo for [place] - 12/27/15 1253      CC Long Term Goal  #1   Title Pt will be independent in manual lymphatic drainage for long term management of edema.   Status On-going     CC Long Term Goal  #2   Title Pt will be independent in a home exercise program for strengthening and endurance.    Status On-going     CC Long Term Goal  #3   Title Pt will receive appropriate compression garments for long term management of edema   Status On-going            Plan - 01/01/16 1727    Clinical Impression Statement Patient not sure about a compression  garment and whether he would tolerate wearing one, but was shown a variety of options today.  He has an appointment set up for a Flexitouch pump demo.  He was able to do Kinesiotaping to one side of his neck with verbal instruction and tactile cueing/assist today.   Rehab Potential Excellent   Clinical Impairments Affecting Rehab Potential hx of radiation   PT Frequency 3x / week   PT Duration 4 weeks   PT Treatment/Interventions ADLs/Self Care Home Management;Manual techniques;Manual lymph drainage;Passive range of motion;Therapeutic exercise;Compression bandaging;Vasopneumatic Device;Taping   PT Next Visit Plan remeasure and assess goals; continue manual lymph drainage   Consulted and Agree with Plan of Care Patient      Patient will benefit from skilled therapeutic  intervention in order to improve the following deficits and impairments:  Pain, Decreased endurance, Increased edema  Visit Diagnosis: Lymphedema, not elsewhere classified     Problem List Patient Active Problem List   Diagnosis Date Noted  . Dysuria 10/25/2015  . Decreased oral intake 10/25/2015  . Malnutrition of moderate degree 10/19/2015  . Antineoplastic chemotherapy induced pancytopenia ([place]) 10/18/2015  . Fever   . Candidiasis of mouth and esophagus ([place]) 10/17/2015  . Neutropenia, febrile ([place]) 10/17/2015  . Shingles rash 10/17/2015  . Nausea with vomiting 09/20/2015  . Hyponatremia 09/20/2015  . Oral pharyngeal candidiasis 09/20/2015  . Antineoplastic chemotherapy induced anemia 09/20/2015  . Hypertension   . Cirrhosis ([place])   . Portal hypertension ([place])   . Cancer of tonsillar fossa ([place]) 08/02/2015    Frank Castillo 01/01/2016, 5:31 PM  [place], [place], [address] Phone: [phone]   Fax:  [phone]  Name: Frank Castillo MRN: [id number] Date of Birth: 1950/11/29   Serafina Royals, PT 01/01/16 5:31 PM

## 2016-01-02 ENCOUNTER — Other Ambulatory Visit (HOSPITAL_COMMUNITY): Payer: Self-pay | Admitting: Emergency Medicine

## 2016-01-02 MED ORDER — ESCITALOPRAM OXALATE 20 MG PO TABS: ORAL_TABLET | ORAL | 1 refills | Status: DC

## 2016-01-02 NOTE — Progress Notes (Unsigned)
Pt called and stated that the wellbutrin was making him very on edge by 10 am and nauseated.  He would then have to take an ativan.  Kirby Crigler notified.  Plan:  Lexapro 20 mg called in.  Pt to take lexapro 10mg  for 1 week when normal dose of wellbutrin, then week two increase lexapro to 20mg , decrease wellbutrin to 1 tablet.  Then the next week stop wellbutrin all together.  Pt verbalized understanding.

## 2016-01-03 ENCOUNTER — Ambulatory Visit: Payer: BLUE CROSS/BLUE SHIELD | Admitting: Physical Therapy

## 2016-01-06 ENCOUNTER — Encounter: Payer: Self-pay | Admitting: Physical Therapy

## 2016-01-06 ENCOUNTER — Ambulatory Visit: Payer: BLUE CROSS/BLUE SHIELD | Admitting: Physical Therapy

## 2016-01-06 DIAGNOSIS — I890 Lymphedema, not elsewhere classified: Secondary | ICD-10-CM

## 2016-01-06 NOTE — Therapy (Signed)
[place], [place], [address] Phone: [phone]   Fax:  [phone]  Physical Therapy Treatment  Patient Details  Name: Frank Castillo MRN: [id number] Date of Birth: 06/08/1950 Referring Provider: Whitney Muse  Encounter Date: 01/06/2016      PT End of Session - 01/06/16 1522    Visit Number 8   Number of Visits 13   Date for PT Re-Evaluation 01/14/16   PT Start Time [id number]   PT Stop Time 1520   PT Time Calculation (min) 45 min   Activity Tolerance Patient tolerated treatment well   Behavior During Therapy [facility] for tasks assessed/performed      Past Medical History:  Diagnosis Date  . Cirrhosis ([place])   . Hypertension   . Portal hypertension ([place])   . Tonsillar mass    left    Past Surgical History:  Procedure Laterality Date  . HERNIA REPAIR Right   . MOUTH SURGERY    . TONSILLECTOMY Left 07/09/2015   Procedure: LEFT TONSILLECTOMY;  Surgeon: Leta Baptist, MD;  Location: [facility];  Service: ENT;  Laterality: Left;    There were no vitals filed for this visit.      Subjective Assessment - 01/06/16 1438    Subjective Pt states he did the massage himself this morning.    Patient is accompained by: Family member   Pertinent History Frank Castillo 65 y.o. male referred by Dr. 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 No/denies   Pain Score 0-No pain                         [facility] Adult PT Treatment/Exercise - 01/06/16 0001      Manual Therapy   Manual Lymphatic Drainage (MLD) short neck, bilateral axillary nodes, pectoral, shoulder collectors, then working up lateral neck to ears stretching skin downward x 2, then working from lateral neck to medial neck stretching skin back  towards lateral neck then moving fluid down in different sections to cover entire neck                        Long Term Clinic Goals - 01/06/16 1700      CC Long Term Goal  #1   Title Pt will be independent in manual lymphatic drainage for long term management of edema.   Baseline 01/06/16- pt is progressing towards independence but still requires cues   Status On-going     CC Long Term Goal  #2   Title Pt will be independent in a home exercise program for strengthening and endurance.    Status On-going     CC Long Term Goal  #3   Title Pt will receive appropriate compression garments for long term management of edema   Baseline 01/06/16- pt given more information on purchasing garments and encouraged to do so   Status On-going            Plan - 01/06/16 1522    Clinical Impression Statement Pt demonstrated good reduction of head and neck edema following this session per visual estimate. Pt states he has been trying to do the massage at home. He stated he was pushing the skin on the lateral sides  of his neck up towards his ears and was educated that this was incorrect. Pt given a handout with information for [employer] to obtain a head and neck compression garment.    Rehab Potential Excellent   Clinical Impairments Affecting Rehab Potential hx of radiation   PT Frequency 3x / week   PT Duration 4 weeks   PT Treatment/Interventions ADLs/Self Care Home Management;Manual techniques;Manual lymph drainage;Passive range of motion;Therapeutic exercise;Compression bandaging;Vasopneumatic Device;Taping   PT Next Visit Plan remeasure, assess pt's indep with MLD by having him demonstrate and giving feedback    Consulted and Agree with Plan of Care Patient      Patient will benefit from skilled therapeutic intervention in order to improve the following deficits and impairments:  Pain, Decreased endurance, Increased edema  Visit Diagnosis: Lymphedema, not elsewhere  classified     Problem List Patient Active Problem List   Diagnosis Date Noted  . Dysuria 10/25/2015  . Decreased oral intake 10/25/2015  . Malnutrition of moderate degree 10/19/2015  . Antineoplastic chemotherapy induced pancytopenia ([place]) 10/18/2015  . Fever   . Candidiasis of mouth and esophagus ([place]) 10/17/2015  . Neutropenia, febrile ([place]) 10/17/2015  . Shingles rash 10/17/2015  . Nausea with vomiting 09/20/2015  . Hyponatremia 09/20/2015  . Oral pharyngeal candidiasis 09/20/2015  . Antineoplastic chemotherapy induced anemia 09/20/2015  . Hypertension   . Cirrhosis ([place])   . Portal hypertension ([place])   . Cancer of tonsillar fossa ([place]) 08/02/2015    Frank Castillo 01/06/2016, 5:03 PM  [place], [place], [address] Phone: [phone]   Fax:  [phone]  Name: Frank Castillo MRN: [id number] Date of Birth: 27-Mar-1951   Allyson Sabal, PT 01/06/16 5:03 PM

## 2016-01-08 ENCOUNTER — Ambulatory Visit: Payer: BLUE CROSS/BLUE SHIELD

## 2016-01-08 DIAGNOSIS — I890 Lymphedema, not elsewhere classified: Secondary | ICD-10-CM | POA: Diagnosis not present

## 2016-01-08 NOTE — Therapy (Signed)
[place], [place], [address] Phone: [phone]   Fax:  [phone]  Physical Therapy Treatment  Patient Details  Name: Frank Castillo MRN: [id number] Date of Birth: February 25, 1951 Referring Provider: Whitney Muse  Encounter Date: 01/08/2016      PT End of Session - 01/08/16 1237    Visit Number 9   Number of Visits 13   Date for PT Re-Evaluation 01/14/16   PT Start Time 1020   PT Stop Time 1105   PT Time Calculation (min) 45 min   Activity Tolerance Patient tolerated treatment well   Behavior During Therapy [facility] for tasks assessed/performed      Past Medical History:  Diagnosis Date  . Cirrhosis ([place])   . Hypertension   . Portal hypertension ([place])   . Tonsillar mass    left    Past Surgical History:  Procedure Laterality Date  . HERNIA REPAIR Right   . MOUTH SURGERY    . TONSILLECTOMY Left 07/09/2015   Procedure: LEFT TONSILLECTOMY;  Surgeon: Leta Baptist, MD;  Location: [place];  Service: ENT;  Laterality: Left;    There were no vitals filed for this visit.      Subjective Assessment - 01/08/16 1044    Subjective Need to review again the right way to push the fluid. Also have an appointment with [facility] today to get measured for my head/neck compression garment.   Pertinent History Frank Castillo 65 y.o. male referred by Dr. 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 No/denies               LYMPHEDEMA/ONCOLOGY QUESTIONNAIRE - 01/08/16 1046      Head and Neck   Right Corner of mouth to where ear lobe meets face 10.5 cm   Left Corner of mouth to where ear lobe meets face 10.5 cm   4 cm superior to sternal notch around neck 39.8 cm   6 cm superior to sternal notch around  neck 40.5 cm   8 cm superior to sternal notch around neck 41.1 cm                  [facility] Adult PT Treatment/Exercise - 01/08/16 0001      Manual Therapy   Manual Lymphatic Drainage (MLD) short and long neck, bil shoulder collectors, bil axillary and pectoral nodes, bil anterior upper quadrants avoiding port on Rt, 5 diaphragmatic breaths (after instructing pt in correct technique)  working from lateral neck to medial neck stretching skin back towards lateral neck then moving fluid down in different sections to cover entire neck, submental nodes and bil cheeks and retroauricular nodes directing towards lateral neck and ultimately bil axillary nodes.                PT Education - 01/08/16 1109    Education provided Yes   Education Details With demonstration and pt performing with therapist whole sequence of self MLD and then therapist performed for further review   Person(s) Educated Patient;Other (comment)  Sister from out of town   Methods Explanation;Demonstration;Handout;Verbal cues   Comprehension Verbalized understanding;Returned demonstration;Need further instruction                [facility] Goals - 01/06/16 1700  CC Long Term Goal  #1   Title Pt will be independent in manual lymphatic drainage for long term management of edema.   Baseline 01/06/16- pt is progressing towards independence but still requires cues   Status On-going     CC Long Term Goal  #2   Title Pt will be independent in a home exercise program for strengthening and endurance.    Status On-going     CC Long Term Goal  #3   Title Pt will receive appropriate compression garments for long term management of edema   Baseline 01/06/16- pt given more information on purchasing garments and encouraged to do so   Status On-going            Plan - 01/08/16 1238    Clinical Impression Statement Pt continues with good reduction of circumference measurements from last time  measured. He did well with review of self MLD today and mostly, at this point, just required minor reminders for correct sequence to next step. Reissued handout for further reiteration at home. Pt was returning very good demonstration by end of session today. Was going to [facility] to get measured for a head/neck compression garment after appointment this morning.    Rehab Potential Excellent   Clinical Impairments Affecting Rehab Potential hx of radiation   PT Frequency 3x / week   PT Duration 4 weeks   PT Treatment/Interventions ADLs/Self Care Home Management;Manual techniques;Manual lymph drainage;Passive range of motion;Therapeutic exercise;Compression bandaging;Vasopneumatic Device;Taping   PT Next Visit Plan Plan to D/C tomorrw. Review prn pt's indep with MLD by having him demonstrate and giving feedback. Assess how appointment at [facility] went today.   Consulted and Agree with Plan of Care Patient      Patient will benefit from skilled therapeutic intervention in order to improve the following deficits and impairments:  Pain, Decreased endurance, Increased edema  Visit Diagnosis: Lymphedema, not elsewhere classified     Problem List Patient Active Problem List   Diagnosis Date Noted  . Dysuria 10/25/2015  . Decreased oral intake 10/25/2015  . Malnutrition of moderate degree 10/19/2015  . Antineoplastic chemotherapy induced pancytopenia ([place]) 10/18/2015  . Fever   . Candidiasis of mouth and esophagus ([place]) 10/17/2015  . Neutropenia, febrile ([place]) 10/17/2015  . Shingles rash 10/17/2015  . Nausea with vomiting 09/20/2015  . Hyponatremia 09/20/2015  . Oral pharyngeal candidiasis 09/20/2015  . Antineoplastic chemotherapy induced anemia 09/20/2015  . Hypertension   . Cirrhosis ([place])   . Portal hypertension ([place])   . Cancer of tonsillar fossa ([place]) 08/02/2015    Otelia Limes, PTA 01/08/2016, 12:46 PM  [place], [place], [address] Phone: [phone]   Fax:  [phone]  Name: Frank Castillo MRN: [id number] Date of Birth: [birth year]

## 2016-01-09 ENCOUNTER — Ambulatory Visit: Payer: BLUE CROSS/BLUE SHIELD

## 2016-01-09 DIAGNOSIS — I890 Lymphedema, not elsewhere classified: Secondary | ICD-10-CM | POA: Diagnosis not present

## 2016-01-09 NOTE — Therapy (Addendum)
[place], [place], [address] Phone: [phone]   Fax:  [phone]  Physical Therapy Treatment  Patient Details  Name: Frank Castillo MRN: [id number] Date of Birth: [birth year] Referring Provider: Whitney Muse  Encounter Date: 01/09/2016      PT End of Session - 01/09/16 1002    Visit Number 10   Number of Visits 13   Date for PT Re-Evaluation 01/14/16   PT Start Time 0920   PT Stop Time 1002   PT Time Calculation (min) 42 min   Activity Tolerance Patient tolerated treatment well   Behavior During Therapy [facility] for tasks assessed/performed      Past Medical History:  Diagnosis Date  . Cirrhosis ([place])   . Hypertension   . Portal hypertension ([place])   . Tonsillar mass    left    Past Surgical History:  Procedure Laterality Date  . HERNIA REPAIR Right   . MOUTH SURGERY    . TONSILLECTOMY Left 07/09/2015   Procedure: LEFT TONSILLECTOMY;  Surgeon: Leta Baptist, MD;  Location: [facility];  Service: ENT;  Laterality: Left;    There were no vitals filed for this visit.      Subjective Assessment - 01/09/16 0926    Subjective I'm feeling good about things and feel ready to D/C now. Got measured for my compression garment yesterday.   Pertinent History Kush Farabee 65 y.o. male referred by Dr. 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 No/denies               LYMPHEDEMA/ONCOLOGY QUESTIONNAIRE - 01/08/16 1046      Head and Neck   Right Corner of mouth to where ear lobe meets face 10.5 cm   Left Corner of mouth to where ear lobe meets face 10.5 cm   4 cm superior to sternal notch around neck 39.8 cm   6 cm superior to sternal notch around neck 40.5 cm   8 cm superior to sternal notch  around neck 41.1 cm                  [facility] Adult PT Treatment/Exercise - 01/09/16 0001      Manual Therapy   Manual Lymphatic Drainage (MLD) short and long neck, bil shoulder collectors, bil axillary and pectoral nodes, bil anterior upper quadrants avoiding port on Rt, 5 diaphragmatic breaths (after instructing pt in correct technique)  working from lateral neck to medial neck stretching skin back towards lateral neck then moving fluid down in different sections to cover entire neck, submental nodes and bil cheeks and retroauricular nodes directing towards lateral neck and ultimately bil axillary nodes.                PT Education - 01/08/16 1109    Education provided Yes   Education Details With demonstration and pt performing with therapist whole sequence of self MLD and then therapist performed for further review   Person(s) Educated Patient;Other (comment)  Sister from out of town   Methods Explanation;Demonstration;Handout;Verbal cues   Comprehension Verbalized understanding;Returned demonstration;Need further instruction                [facility] Goals - 01/09/16 1009      CC Long Term Goal  #  1   Title Pt will be independent in manual lymphatic drainage for long term management of edema.   Status Achieved     CC Long Term Goal  #2   Title Pt will be independent in a home exercise program for strengthening and endurance.    Status Achieved     CC Long Term Goal  #3   Title Pt will receive appropriate compression garments for long term management of edema   Baseline 01/06/16- pt given more information on purchasing garments and encouraged to do so; was measured for this yesterday 01/09/16   Status Achieved            Plan - 01/09/16 1003    Clinical Impression Statement Pt is independent with his self MLD and was measured for his head/neck garment. Pt has done very well with therapy and has met all goals and is ready for D/C at this time.     Rehab Potential Excellent   Clinical Impairments Affecting Rehab Potential hx of radiation   PT Frequency 3x / week   PT Duration 4 weeks   PT Treatment/Interventions ADLs/Self Care Home Management;Manual techniques;Manual lymph drainage;Passive range of motion;Therapeutic exercise;Compression bandaging;Vasopneumatic Device;Taping   PT Next Visit Plan D/C this visit.    Consulted and Agree with Plan of Care Patient      Patient will benefit from skilled therapeutic intervention in order to improve the following deficits and impairments:  Pain, Decreased endurance, Increased edema  Visit Diagnosis: Lymphedema, not elsewhere classified     Problem List Patient Active Problem List   Diagnosis Date Noted  . Dysuria 10/25/2015  . Decreased oral intake 10/25/2015  . Malnutrition of moderate degree 10/19/2015  . Antineoplastic chemotherapy induced pancytopenia ([place]) 10/18/2015  . Fever   . Candidiasis of mouth and esophagus ([place]) 10/17/2015  . Neutropenia, febrile ([place]) 10/17/2015  . Shingles rash 10/17/2015  . Nausea with vomiting 09/20/2015  . Hyponatremia 09/20/2015  . Oral pharyngeal candidiasis 09/20/2015  . Antineoplastic chemotherapy induced anemia 09/20/2015  . Hypertension   . Cirrhosis ([place])   . Portal hypertension ([place])   . Cancer of tonsillar fossa ([place]) 08/02/2015    Otelia Limes, PTA 01/09/2016, 10:10 AM  [place], [place], [address] Phone: [phone]   Fax:  [phone]  Name: Frank Castillo MRN: [id number] Date of Birth: [birth year]   PHYSICAL THERAPY DISCHARGE SUMMARY  Visits from Start of Care:10  Current functional level related to goals / functional outcomes: Pt is now able to independently manage his lymphedema through self drainage technique. He is receiving a compression garment and a compression pump for long term management   Remaining  deficits: none   Education / Equipment: Compression garment, compression pump, self drainage Plan: Patient agrees to discharge.  Patient goals were met. Patient is being discharged due to meeting the stated rehab goals.  ?????     Allyson Sabal, PT 01/09/16 5:34 PM

## 2016-01-10 ENCOUNTER — Ambulatory Visit: Payer: BLUE CROSS/BLUE SHIELD | Admitting: Physical Therapy

## 2016-01-15 ENCOUNTER — Encounter: Payer: Self-pay | Admitting: Dietician

## 2016-01-15 ENCOUNTER — Telehealth (HOSPITAL_COMMUNITY): Payer: Self-pay | Admitting: Oncology

## 2016-01-15 ENCOUNTER — Encounter (HOSPITAL_COMMUNITY): Payer: Self-pay | Admitting: Hematology & Oncology

## 2016-01-15 ENCOUNTER — Encounter (HOSPITAL_COMMUNITY): Payer: Self-pay | Admitting: Oncology

## 2016-01-15 ENCOUNTER — Encounter (HOSPITAL_BASED_OUTPATIENT_CLINIC_OR_DEPARTMENT_OTHER): Payer: BLUE CROSS/BLUE SHIELD | Admitting: Hematology & Oncology

## 2016-01-15 VITALS — BP 157/86 | HR 78 | Temp 97.5°F | Resp 16 | Wt 150.8 lb

## 2016-01-15 DIAGNOSIS — C099 Malignant neoplasm of tonsil, unspecified: Secondary | ICD-10-CM

## 2016-01-15 DIAGNOSIS — F329 Major depressive disorder, single episode, unspecified: Secondary | ICD-10-CM

## 2016-01-15 DIAGNOSIS — K766 Portal hypertension: Secondary | ICD-10-CM

## 2016-01-15 DIAGNOSIS — C090 Malignant neoplasm of tonsillar fossa: Secondary | ICD-10-CM | POA: Diagnosis not present

## 2016-01-15 DIAGNOSIS — K7460 Unspecified cirrhosis of liver: Secondary | ICD-10-CM | POA: Diagnosis not present

## 2016-01-15 DIAGNOSIS — D61810 Antineoplastic chemotherapy induced pancytopenia: Secondary | ICD-10-CM | POA: Diagnosis not present

## 2016-01-15 DIAGNOSIS — Z720 Tobacco use: Secondary | ICD-10-CM

## 2016-01-15 MED ORDER — LORAZEPAM 0.5 MG PO TABS: 0.5000 mg | ORAL_TABLET | Freq: Four times a day (QID) | ORAL | 0 refills | Status: DC | PRN

## 2016-01-15 MED ORDER — SODIUM FLUORIDE 1.1 % DT GEL: DENTAL | 2 refills | Status: DC

## 2016-01-15 MED ORDER — NICOTINE 21 MG/24HR TD PT24: 21.0000 mg | MEDICATED_PATCH | Freq: Every day | TRANSDERMAL | 0 refills | Status: DC

## 2016-01-15 NOTE — Progress Notes (Signed)
Follow up with H&N patient who is s/p chemoradiation and under going rehabilitation   Contacted Pt by seeing after office visit.   Wt Readings from Last 10 Encounters:  01/15/16 150 lb 12.8 oz (68.4 kg)  12/27/15 153 lb 11.2 oz (69.7 kg)  12/17/15 155 lb 6.4 oz (70.5 kg)  11/28/15 154 lb 12.8 oz (70.2 kg)  11/05/15 162 lb 3.2 oz (73.6 kg)  10/22/15 161 lb 6.4 oz (73.2 kg)  10/17/15 170 lb 6.1 oz (77.3 kg)  10/17/15 170 lb 9.6 oz (77.4 kg)  10/14/15 164 lb 3.2 oz (74.5 kg)  10/09/15 164 lb 12.8 oz (74.8 kg)   Unfortunately, despite the completion of therapy, Patient's weight has continued to slowly decline, roughly at a rate of 1 lb per week. He has lost 30 lbs in 3 months and 40 lbs in 5 months.   Patient reports oral intake as minimal, but improving. His biggest issue is a lack of desire to eat which is secondary to loss of taste and severe dry mouth. He is still infusing TF at a large amount 4-6 cans of jevity a day, depending on how he eats. He infuses 2 cans at a time and flushes with 1 syringe before, 2 after.   Additionally, Pt's lack of motivation and/or depression/anxiety have been an ongoing problems. MD had prescribed antidepressant ~1 month ago.   Pt's sister today notes that these are this is truly the limiting factor to his rehabilitation. "He has great ideas, but it is just getting him to do them"  RD gave handout and talked about soft/moist high protein foods and we discussed some potential meal options. Again, his items he can eat are very limited due to mouth dryness. He cannot eat anything that "absorbs moisture" such as dough/grains. He has been avoiding breads. He cannot eat eggs because he overcooks them. He can do egg salad/tuna salad. Additionally, RD recommended mouth care/Nacl + bicarb rinse to try to remove debris and improve taste/moisture in mouth. He notes he has tried biotene and xylitol gum, but neither have really had an impact. "I have never been a gum  chewer".   RD discussed how to combat loss of taste with marinades, spices and prioritizing sweet foods. He does believe his taste is coming back. RD asked if he had noticed fruit has the most taste to it. He reports he had.   Pt has always consumed large amounts of fruits/vegetables and even more so recently due to the inherent water/moisture content of these foods. He has recently been making smoothies and adding weigh powder. Acknowledged that this was a very good idea.   RD offered case of oral supplements to add to his smoothies. He asked what the benefit would be over the protein powder. While the powder has more protein, he is more in need of kcals at this time. His protein powder will have <150 kcals where as the Ensure is 350/ 8 oz.  He was agreeable to this intervention. Will order.   Pt is avoiding most high kcal/pro foods due to the consistency of these items. Hopefully list of the soft/moist high protein foods will help.  His mouth dryness is also playing a part in his weight loss due to his need to prioritize high water/low kcal foods. RD reached out to SLP for further recs. Finally, his mental/psych problems have been very difficult to overcome. RD offered continued encouragement, MD is trying to better control this with pharmacotherapy.   Left coupons  and handouts titled "Soft and Moist High Protein Menu Ideas" and "Taste Changes"   [name] RD, LDN, [place] [facility] Pager: [phone] 01/15/2016 9:45 AM

## 2016-01-15 NOTE — Progress Notes (Signed)
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 He is seen at [facility], Hepatitis C negative CT neck with contrast 06/25/2015 at [place] level 2 malignant LAD, largest node is 3.5 cm long axis. R worse than L carotid atherosclerosis and stenosis. CT chest with no acute or significant findings within the chest    Cancer of tonsillar fossa (Frank Castillo)   08/02/2015 Initial Diagnosis    Cancer of tonsillar fossa (Frank Castillo)      08/20/2015 Procedure    Port-a-cath placed by IR      08/20/2015 Procedure    G-tube placed by IR      08/21/2015 - 10/09/2015 Radiation Therapy    70 Gy, Dr. Isidore Moos.      08/21/2015 - 09/10/2015 Chemotherapy    Cisplatin every 21 days with XRT.      08/28/2015 Adverse Reaction    Tinnitis, Cisplatin-induced.      09/11/2015 - 09/30/2015 Chemotherapy    Carboplatin/5FU      09/20/2015 - 09/21/2015 Hospital Admission    Intractable nausea/vomiting. Severe Mucositis      10/17/2015 - 10/20/2015 Hospital Admission    Esophageal/oral candidasis. Shingles. Fever. Chemotherapy induced pancytopenia       HISTORY OF PRESENTING ILLNESS:  Frank Castillo 65 y.o. male is here for further follow-up of Stage IVA T2N2bM0 squamous cell Ca of the L tonsil. Left tonsillectomy performed on 07/09/2015 with Dr. Benjamine Castillo. He has completed therapy.   [name] [facility] today accompanied by his sister.  He reports profuse sweating after taking his pilocarpine, he does note however that his mouth may not be as dry.  States he needs a physician order to return his "suction machine", the one  used for prior mucositis.   He is requesting a prescription for Fluoroguard. He is requesting a refill on Ativan which he still uses from time to time. He is on 12 mcg fentanyl and has one patch left. He is planning on discontinuing his patch when his last patch is gone.   He has been doing well. He has been walking regularly.  The other day he did experience throat pain but took about 3 ibuprofen to resolve this. He has decreased his oxycodone use. He eats about two good meals a day. He has added whey protein in his diet. He notes he has a sore throat at the end of each day.   Tomorrow is his last day of taking 1 tablet of Wellbutrin and 1 tablet Lexapro. After that he will be off his wellbutrin. He definitely believes it made him agitated. His mood has been better. He admits that part of his problem was acceptance, and realizing he will need more time to recover than originally expected.   He continues to smoke. He has been thinking about when to start his smoking cessation program.   MEDICAL HISTORY:  Past Medical History:  Diagnosis Date  . Cirrhosis ([place])   . Hypertension   . Portal hypertension ([place])   . Tonsillar mass    left    SURGICAL HISTORY: Past Surgical History:  Procedure Laterality Date  . HERNIA REPAIR Right   . MOUTH SURGERY    .  TONSILLECTOMY Left 07/09/2015   Procedure: LEFT TONSILLECTOMY;  Surgeon: Leta Baptist, MD;  Location: [facility];  Service: ENT;  Laterality: Left;    SOCIAL HISTORY: Social History   Social History  . Marital status: Single    Spouse name: N/A  . Number of children: 0  . Years of education: N/A   Occupational History  . Not on file.   Social History Main Topics  . Smoking status: Current Every Day Smoker    Packs/day: 0.50    Types: Cigarettes  . Smokeless tobacco: Never Used  . Alcohol use No     Comment: former abuse- quit 09/2011  . Drug use: No  . Sexual activity: Not on file   Other Topics Concern  . Not  on file   Social History Narrative   Divorced.   No children.   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   Currently smoking approximately one pack per day.   Has been smoking off/on since age 44.   Divorced No children. Just step-children. 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 Previously worked in [employer]. Originally from [place], [place]. Current smoker at 1 ppd. Began smoking at 37-17 yo, regularly smoking at 3-21 yo. During his "cocaine years" he smoked about 2 ppd. ETOH, recovering alcoholic. 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 He enjoys reading. 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  FAMILY HISTORY: Family History  Problem Relation Age of Onset  . Macular degeneration Mother   . Alcoholism Father    indicated that his mother is alive. He indicated that his father is deceased.   Mother still living at 56 years-old, with macular degeneration. She has no other health issues. Father deceased. Parents divorced at 64 years-old, so he is not sure what he died from. Father was an alcoholic. 4 sisters. 1 sister died of suicide. 1 sister was adopted. 1 Sister lives in [place]. 1 Sister lives in [place].  ALLERGIES:  is allergic to bee venom and other.  MEDICATIONS:  Current Outpatient Prescriptions  Medication Sig Dispense Refill  . buPROPion (WELLBUTRIN XL) 150 MG 24 hr tablet Take once daily in the am, after 5 days increase to two tablets 60 tablet 3  . cetirizine (ZYRTEC) 10 MG tablet Take 10 mg by mouth daily.    . cholecalciferol (VITAMIN D) 1000 units tablet Take 1,000 Units by mouth daily.    Marland Kitchen escitalopram (LEXAPRO) 20 MG tablet Take 1/2 tablet for 1 week then increase to 1 tablet daily. 30 tablet 1  . fentaNYL (DURAGESIC - DOSED MCG/HR) 12 MCG/HR Place 1 patch (12.5 mcg total) onto the skin every 3 (three) days. 10 patch 0  . LORazepam (ATIVAN) 0.5 MG tablet Take 1 tablet (0.5 mg total) by mouth every 6 (six) hours as needed for anxiety. 45  tablet 0  . metoCLOPramide (REGLAN) 5 MG tablet Take 1 tablet (5 mg total) by mouth 4 (four) times daily -  before meals and at bedtime. 240 tablet 1  . Nutritional Supplements (JEVITY 1.2 CAL/FIBER) LIQD 8 cans/day.  4 feeding of 2 cans each. Flush with 100 mls before and after each feeding 1000 mL 6  . ondansetron (ZOFRAN ODT) 8 MG disintegrating tablet Take 1 tablet (8 mg total) by mouth every 8 (eight) hours as needed for nausea or vomiting. 30 tablet 2  . oxyCODONE (ROXICODONE) 5 MG/5ML solution Take 5-10 mLs (5-10 mg total) by mouth every 4 (four) hours as needed for severe  pain. 250 mL 0  . pilocarpine (SALAGEN) 5 MG tablet Take 1 tablet (5 mg total) by mouth 3 (three) times daily. 90 tablet 3  . sodium fluoride (FLUORISHIELD) 1.1 % GEL dental gel Instill one drop of gel per tooth space of fluoride tray. Place over teeth for 5 minutes. Remove. Spit out excess. Repeat nightly. 120 mL prn  . fentaNYL (DURAGESIC - DOSED MCG/HR) 25 MCG/HR patch Place 1 patch (25 mcg total) onto the skin every 3 (three) days. (Patient not taking: Reported on 12/17/2015) 10 patch 0   No current facility-administered medications for this visit.    Facility-Administered Medications Ordered in Other Visits  Medication Dose Route Frequency Provider Last Rate Last Dose  . 0.9 %  sodium chloride infusion   Intravenous Once [employer], PA-C      . 0.9 %  sodium chloride infusion   Intravenous Continuous Manon Hilding Kefalas, PA-C      . 0.9 %  sodium chloride infusion   Intravenous Continuous Manon Hilding Kefalas, PA-C      . 0.9 %  sodium chloride infusion   Intravenous Continuous Kelby Fam Penland, MD      . 0.9 %  sodium chloride infusion   Intravenous Continuous Kelby Fam Penland, MD      . 0.9 %  sodium chloride infusion   Intravenous Continuous Frank Ranks, MD      . sodium chloride flush (NS) 0.9 % injection 10 mL  10 mL Intravenous PRN Frank Ranks, MD   10 mL at 12/27/15 0845    Review of Systems    Constitutional: Positive for malaise/fatigue. Negative for fever, chills.  HENT: Positive for mouth pain, and sore throat. Negative for congestion, hearing loss and nosebleeds.   Dry mouth Eyes: Negative.  Negative for blurred vision, double vision, pain and discharge.  Respiratory: Negative for cough, hemoptysis, shortness of breath and wheezing.   Cardiovascular: Negative.  Negative for chest pain, palpitations, claudication, leg swelling and PND.  Gastrointestinal: Negative for heartburn, vomiting, abdominal pain, blood in stool and melena.  Genitourinary: Negative.  Negative for dysuria, urgency, frequency and hematuria.  Musculoskeletal: Negative.  Negative for myalgias, joint pain and falls.  Skin: Negative  Neurological: Negative.  Negative for dizziness, tingling, tremors, sensory change, speech change, focal weakness, seizures, loss of consciousness, weakness and headaches.  Endo/Heme/Allergies: Negative.  Does not bruise/bleed easily.  Psychiatric/Behavioral: Positive for insomnia.  Negative for depression, suicidal ideas, memory loss and substance abuse. The patient is not nervous/anxious. All other systems reviewed and are negative. 14 point ROS was done and is otherwise as detailed above or in HPI    PHYSICAL EXAMINATION: ECOG PERFORMANCE STATUS: 1 - Symptomatic but completely ambulatory  Vitals:   01/15/16 0842  BP: (!) 157/86  Pulse: 78  Resp: 16  Temp: 97.5 F (36.4 C)    Physical Exam  Constitutional: He is oriented to person, place, and time  and in no distress.  HENT:  Head: Normocephalic and atraumatic.  Nose: Nose normal.  Mouth/Throat: No mucositis. Oropharynx clear. Tongue with normal ROM Eyes: Conjunctivae and EOM are normal. Pupils are equal, round, and reactive to light. Right eye exhibits no discharge. Left eye exhibits no discharge. No scleral icterus.  Neck: Normal range of motion. Neck supple. No tracheal deviation present. No thyromegaly present.  Lymphedema noted.  Cardiovascular: Normal rate, regular rhythm and normal heart sounds.  Exam reveals no gallop and no friction rub.  Port site is C/D/I  no erythema or bruising No murmur heard.  Pulmonary/Chest: Effort normal and breath sounds normal. He has no wheezes. He has no rales.  Abdominal: Soft. Bowel sounds are normal. He exhibits no distension and no mass. There is no tenderness. There is no rebound and no guarding. FT site is C/D/I, no erythema Musculoskeletal: Normal range of motion. He exhibits no edema.  Neurological: He is alert and oriented to person, place, and time. He has normal reflexes. No cranial nerve deficit. Gait normal. Coordination normal.  Skin: Skin is warm and dry.  Psychiatric: Mood, memory, affect and judgment normal.  Nursing note and vitals reviewed.   LABORATORY DATA:  I have reviewed the data as listed Results for HARTWELL, VANDIVER (MRN [id number]) as of 01/15/2016 09:11  Ref. Range 12/27/2015 08:45  Sodium Latest Ref Range: 135 - 145 mmol/L 133 (L)  Potassium Latest Ref Range: 3.5 - 5.1 mmol/L 4.2  Chloride Latest Ref Range: 101 - 111 mmol/L 103  CO2 Latest Ref Range: 22 - 32 mmol/L 27  BUN Latest Ref Range: 6 - 20 mg/dL 13  Creatinine Latest Ref Range: 0.61 - 1.24 mg/dL 0.62  Calcium Latest Ref Range: 8.9 - 10.3 mg/dL 9.1  EGFR (Non-African Amer.) Latest Ref Range: >60 mL/min >60  EGFR (African American) Latest Ref Range: >60 mL/min >60  Glucose Latest Ref Range: 65 - 99 mg/dL 88  Anion gap Latest Ref Range: 5 - 15  3 (L)  Alkaline Phosphatase Latest Ref Range: 38 - 126 U/L 48  Albumin Latest Ref Range: 3.5 - 5.0 g/dL 4.1  AST Latest Ref Range: 15 - 41 U/L 14 (L)  ALT Latest Ref Range: 17 - 63 U/L 12 (L)  Total Protein Latest Ref Range: 6.5 - 8.1 g/dL 6.8  Total Bilirubin Latest Ref Range: 0.3 - 1.2 mg/dL 0.4  WBC Latest Ref Range: 4.0 - 10.5 K/uL 6.4  RBC Latest Ref Range: 4.22 - 5.81 MIL/uL 3.89 (L)  Hemoglobin Latest Ref Range: 13.0 - 17.0 g/dL  12.3 (L)  HCT Latest Ref Range: 39.0 - 52.0 % 37.3 (L)  MCV Latest Ref Range: 78.0 - 100.0 fL 95.9  MCH Latest Ref Range: 26.0 - 34.0 pg 31.6  MCHC Latest Ref Range: 30.0 - 36.0 g/dL 33.0  RDW Latest Ref Range: 11.5 - 15.5 % 14.0  Platelets Latest Ref Range: 150 - 400 K/uL 201  Neutrophils Latest Units: % 78  Lymphocytes Latest Units: % 5  Monocytes Relative Latest Units: % 13  Eosinophil Latest Units: % 3  Basophil Latest Units: % 1  NEUT# Latest Ref Range: 1.7 - 7.7 K/uL 5.0  Lymphocyte # Latest Ref Range: 0.7 - 4.0 K/uL 0.3 (L)  Monocyte # Latest Ref Range: 0.1 - 1.0 K/uL 0.8  Eosinophils Absolute Latest Ref Range: 0.0 - 0.7 K/uL 0.2  Basophils Absolute Latest Ref Range: 0.0 - 0.1 K/uL 0.1  TSH Latest Ref Range: 0.350 - 4.500 uIU/mL 1.338  T4,Free(Direct) Latest Ref Range: 0.61 - 1.12 ng/dL 1.04   RADIOGRAPHIC STUDIES: I have personally reviewed the radiological reports as listed.  Study Result   CLINICAL DATA:  Congestion, possible shingles  EXAM: PORTABLE CHEST 1 VIEW  COMPARISON:  None.  FINDINGS: Heart size mildly enlarged. Vascular pattern normal. Lungs clear. No pleural effusion. Right Port-A-Cath noted.  IMPRESSION: Mild cardiac silhouette enlargement.  Otherwise negative   Electronically Signed   By: Skipper Cliche M.D.   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 He is [facility] at [facility], Hepatitis C negative CT neck with contrast 06/25/2015 at [place] level 2 malignant LAD, largest node is 3.5 cm long axis. R worse than L carotid atherosclerosis and stenosis. CT chest with no acute or significant findings within the chest [facility] stay secondary to intractable n/v and mucositis Oropharyngeal candidiasis/esophageal candidiasis Odynophagia Fever Shingles Chemotherapy induced  pancytopenia  He continues to improve. Mood is definitely better off of Wellbutrin. He will continue on Lexapro. He is going to discontinue his fentanyl after his next patch. He has oxycodone if needed for pain. I have refilled his Ativan.  I have written the patient a prescription for Fluoroguard, at the patient's request.    We discussed the need to discontinue smoking today. I given paperwork regarding smoking cessation classes. I also written for neck obtained patches.  His weight is down some, Ovid Curd met with the patient today. Dr. Isidore Moos has arranged for imaging which is coming up in the next several weeks.  I will see Rosalio back in the next 2-3 weeks. His recovery is better with ongoing close visits. He knows to call in the interim if there is anything he needs.  All questions were answered. The patient knows to call the clinic with any problems, questions or concerns.  This document serves as a record of services personally performed by Ancil Linsey, MD. It was created on her behalf by Arlyce Harman, a trained medical scribe. The creation of this record is based on the scribe's personal observations and the provider's statements to them. This document has been checked and approved by the attending provider.  I have reviewed the above documentation for accuracy and completeness, and I agree with the above.  This note was electronically signed.    Molli Hazard, MD  01/15/2016 8:53 AM

## 2016-01-15 NOTE — Patient Instructions (Addendum)
[place] at [facility] Discharge Instructions  RECOMMENDATIONS MADE BY THE CONSULTANT AND ANY TEST RESULTS WILL BE SENT TO YOUR REFERRING PHYSICIAN.  You saw Dr. Whitney Muse today. Follow up with Dr. Whitney Muse in 2 1/2 weeks.  Thank you for choosing [place] at [facility] to provide your oncology and hematology care.  To afford each patient quality time with our provider, please arrive at least 15 minutes before your scheduled appointment time.   Beginning January 23rd 2017 lab work for the [facility] will be done in the  Main lab at [facility] on 1st floor. If you have a lab appointment with the [facility] please come in thru the  Main Entrance and check in at the main information desk  You need to re-schedule your appointment should you arrive 10 or more minutes late.  We strive to give you quality time with our providers, and arriving late affects you and other patients whose appointments are after yours.  Also, if you no show three or more times for appointments you may be dismissed from the clinic at the providers discretion.     Again, thank you for choosing [facility].  Our hope is that these requests will decrease the amount of time that you wait before being seen by our physicians.       _____________________________________________________________  Should you have questions after your visit to [facility], please contact our office at [phone] between the hours of 8:30 a.m. and 4:30 p.m.  Voicemails left after 4:30 p.m. will not be returned until the following business day.  For prescription refill requests, have your pharmacy contact our office.         Resources For Cancer Patients and their Caregivers ? American Cancer Society: Can assist with transportation, wigs, general needs, runs Look Good Feel Better.        (910) 003-5407 ? Cancer Care: Provides financial assistance, online support  groups, medication/co-pay assistance.  1-800-813-HOPE (319)036-9219) ? [facility] Assists [place] cancer patients and their families through emotional , educational and financial support.  912-704-0067 ? [place] DSS Where to apply for food stamps, Medicaid and utility assistance. 234-684-6714 ? [facility]: Transportation to medical appointments. 629-791-7666 ? Social Security Administration: May apply for disability if have a Stage IV cancer. 251-271-0809 (470)116-5090 ? [employer], Disability and Transit Services: Assists with nutrition, care and transit needs. [place] Support Programs: @10RELATIVEDAYS @ > Cancer Support Group  2nd Tuesday of the month 1pm-2pm, Journey Room  > Creative Journey  3rd Tuesday of the month 1130am-1pm, Journey Room  > Look Good Feel Better  1st Wednesday of the month 10am-12 noon, Journey Room (Call [place] to register [phone])

## 2016-01-16 ENCOUNTER — Other Ambulatory Visit (HOSPITAL_COMMUNITY): Payer: Self-pay | Admitting: Oncology

## 2016-01-16 ENCOUNTER — Telehealth (HOSPITAL_COMMUNITY): Payer: Self-pay | Admitting: *Deleted

## 2016-01-16 DIAGNOSIS — C090 Malignant neoplasm of tonsillar fossa: Secondary | ICD-10-CM

## 2016-01-16 MED ORDER — FENTANYL 12 MCG/HR TD PT72: 12.5000 ug | MEDICATED_PATCH | TRANSDERMAL | 0 refills | Status: DC

## 2016-01-21 ENCOUNTER — Ambulatory Visit (HOSPITAL_COMMUNITY): Payer: BLUE CROSS/BLUE SHIELD | Attending: Hematology & Oncology | Admitting: Speech Pathology

## 2016-01-21 DIAGNOSIS — R1312 Dysphagia, oropharyngeal phase: Secondary | ICD-10-CM | POA: Diagnosis present

## 2016-01-21 NOTE — Therapy (Signed)
[place], [place], [address] Phone: [phone]   Fax:  [phone]  Speech Language Pathology Treatment  Patient Details  Name: Frank Castillo MRN: [id number] Date of Birth: 1950/09/25 No Data Recorded  Encounter Date: 01/21/2016      End of Session - 01/21/16 1236    Visit Number 5   Number of Visits 6   Authorization Type BCBS   Authorization Time Period through 01/26/2016   SLP Start Time 1030   SLP Stop Time  1115   SLP Time Calculation (min) 45 min   Activity Tolerance Patient tolerated treatment well      Past Medical History:  Diagnosis Date  . Cirrhosis ([place])   . Hypertension   . Portal hypertension ([place])   . Tonsillar mass    left    Past Surgical History:  Procedure Laterality Date  . HERNIA REPAIR Right   . MOUTH SURGERY    . TONSILLECTOMY Left 07/09/2015   Procedure: LEFT TONSILLECTOMY;  Surgeon: Leta Baptist, MD;  Location: [facility];  Service: ENT;  Laterality: Left;    There were no vitals filed for this visit.      Subjective Assessment - 01/21/16 1201    Subjective "I feel like I am forcing everything." (eating)   Currently in Pain? No/denies               ADULT SLP TREATMENT - 01/21/16 1204      General Information   Behavior/Cognition Alert;Cooperative;Pleasant mood   Patient Positioning Upright in chair   Oral care provided N/A   HPI Frank Castillo 65 y.o. male referred by Dr. 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 Pt with mucositis oral and oral candidiasis and treated with nystatin (MYCOSTATIN) 100000 UNIT/ML suspension. Pt will continue with IV fluids three times weekly. Left tonsillectomy 07/09/2015 and PEG placed 08/20/2015. Pt  anticipates that chemo will conclude ~10/22/2015 and radiation ~10/09/2015. Pt referred for swallowing evaluation due to risk of aspiration and malnutrition in setting of head and neck cancer treatment.      Treatment Provided   Treatment provided Dysphagia     Dysphagia Treatment   Temperature Spikes Noted No   Respiratory Status Room air   Oral Cavity - Dentition Adequate natural dentition   Treatment Methods Skilled observation;Therapeutic exercise;Compensation strategy training;Patient/caregiver education   Patient observed directly with PO's Yes   Type of PO's observed Thin liquids   Feeding Able to feed self   Liquids provided via Cup   Amount of cueing Independent     Pain Assessment   Pain Assessment No/denies pain     Assessment / Recommendations / Plan   Plan Continue with current plan of care     Dysphagia Recommendations   Diet recommendations Dysphagia 3 (mechanical soft);Thin liquid   Liquids provided via Cup;Straw   Medication Administration Whole meds with liquid   Supervision Patient able to self feed   Compensations Slow rate;Small sips/bites;Multiple dry swallows after each bite/sip;Follow solids with liquid   Postural Changes and/or Swallow Maneuvers Out of bed for meals;Seated upright 90 degrees;Upright 30-60 min after meal     General Recommendations   Oral Care Recommendations Patient independent with oral care;Oral care before and after PO   Follow up Recommendations Outpatient SLP  Progression Toward Goals   Progression toward goals Progressing toward goals          SLP Education - 01/21/16 1235    Education provided Yes   Education Details continuation of swallowing exercises and food journal; po meds whole with water now   Person(s) Educated Patient   Methods Explanation   Comprehension Verbalized understanding          SLP Short Term Goals - 01/21/16 1237      SLP SHORT TERM GOAL #1   Title Pt will complete swallowing exercises  3x/day 7 days a week with use of written cues and pt/caregiver report.    Baseline not completing   Time 2   Period Months   Status On-going     SLP SHORT TERM GOAL #2   Title Pt will increase oral intake of all consistencies and textures to regular/thin for 3 small meals a day to decrease dependency of PEG and facilitate safe swallow function going forward.    Baseline sipping liquids only   Time 2   Period Months   Status On-going          SLP Long Term Goals - 01/21/16 1237      SLP LONG TERM GOAL #1   Title Pt will demonstrate safe and efficient consumption of self regulated regular textures with use of compensatory strategies as needed.   Baseline Pt only drinking liquids at this time due to odynophagia   Time 2   Period Months   Status On-going          Plan - 01/21/16 1236    Clinical Impression Statement Frank Castillo attended today's appointment alone. Mood noted to be decreased somewhat and pt acknowledges that he is learning to "accept" what "has been given" to him. Progress toward goals: 1) Pt will complete swallowing exercises 3x/day 7 days a week with use of written cues and pt/caregiver report. Pt completing exercises (CTAR and lingual strengthening) and continuation of trismus stretch. 2) Pt will increase oral intake of all consistencies and textures to regular/thin for 3 small meals a day to decrease dependency of PEG and facilitate safe swallow function going forward.  Pt reports weight loss after last SLP visit so has been consuming Ensure Plus by mouth in addition to 4 cans Jevity via PEG. He is bothered by xerostomia and is taking Xylamelt at night to help. He also started Pilocarpine about 3 weeks ago to increase saliva production. He feels that this is helping. He acknowledges that eating is a Writer", but that he continues to try to push liquids and eats a lot of fruit. Lymphedema therapy has been completed, but he continues to use light touch techniques to skin  at home. He is scheduled for PET scan 01/24/16 and sees Dr. Isidore Moos next week. Frank Castillo continues to benefit from encouragement that he is on the right path to recovery. Will space next visit out and have pt return in 4-6 weeks, but he understands that he should call if he notes any decline in swallow function.   Speech Therapy Frequency Monthly   Duration --  1 more visit in 4 weeks   Treatment/Interventions Aspiration precaution training;Pharyngeal strengthening exercises;Diet toleration management by SLP;SLP instruction and feedback;Compensatory strategies;Patient/family education   Potential to Achieve Goals Good   SLP Home Exercise Plan Pt will be independent with HEP as assigned to facilitate carryover of treatment strategies at home   Consulted and Agree with Plan of Care Patient  Patient will benefit from skilled therapeutic intervention in order to improve the following deficits and impairments:   Dysphagia, oropharyngeal phase    Problem List Patient Active Problem List   Diagnosis Date Noted  . Dysuria 10/25/2015  . Decreased oral intake 10/25/2015  . Malnutrition of moderate degree 10/19/2015  . Antineoplastic chemotherapy induced pancytopenia ([place]) 10/18/2015  . Fever   . Candidiasis of mouth and esophagus ([place]) 10/17/2015  . Neutropenia, febrile ([place]) 10/17/2015  . Shingles rash 10/17/2015  . Nausea with vomiting 09/20/2015  . Hyponatremia 09/20/2015  . Oral pharyngeal candidiasis 09/20/2015  . Antineoplastic chemotherapy induced anemia 09/20/2015  . Hypertension   . Cirrhosis ([place])   . Portal hypertension ([place])   . Cancer of tonsillar fossa [facility]) 08/02/2015   Thank you,  Genene Churn, [place]  [facility] 01/21/2016, 12:38 PM  [place] [place] [address] [place], [place], [address] Phone: [phone]   Fax:  [phone]   Name: Juliani Vanengen MRN: [id number] Date of Birth: 06-Sep-1950

## 2016-01-22 ENCOUNTER — Ambulatory Visit (HOSPITAL_COMMUNITY): Payer: Self-pay | Admitting: Speech Pathology

## 2016-01-24 ENCOUNTER — Ambulatory Visit (HOSPITAL_COMMUNITY)
Admission: RE | Admit: 2016-01-24 | Discharge: 2016-01-24 | Disposition: A | Discharge status: Home / Self Care | Payer: BLUE CROSS/BLUE SHIELD | Source: Ambulatory Visit | Attending: Radiation Oncology | Admitting: Radiation Oncology

## 2016-01-24 DIAGNOSIS — C090 Malignant neoplasm of tonsillar fossa: Secondary | ICD-10-CM | POA: Insufficient documentation

## 2016-01-24 LAB — GLUCOSE, CAPILLARY: Glucose-Capillary: 108 mg/dL — ABNORMAL HIGH (ref 65–99)

## 2016-01-24 MED ORDER — FLUDEOXYGLUCOSE F - 18 (FDG) INJECTION
7.4000 | Freq: Once | INTRAVENOUS | Status: AC | PRN
  Administered 2016-01-24: 7.4 via INTRAVENOUS

## 2016-01-29 ENCOUNTER — Other Ambulatory Visit (HOSPITAL_COMMUNITY): Payer: Self-pay | Admitting: Emergency Medicine

## 2016-01-29 DIAGNOSIS — C090 Malignant neoplasm of tonsillar fossa: Secondary | ICD-10-CM

## 2016-01-29 MED ORDER — OXYCODONE HCL 5 MG/5ML PO SOLN: 5.0000 mg | ORAL | 0 refills | Status: DC | PRN

## 2016-02-03 ENCOUNTER — Encounter (HOSPITAL_COMMUNITY): Payer: BLUE CROSS/BLUE SHIELD | Attending: Hematology & Oncology | Admitting: Hematology & Oncology

## 2016-02-03 ENCOUNTER — Encounter (HOSPITAL_COMMUNITY): Payer: Self-pay | Admitting: Hematology & Oncology

## 2016-02-03 ENCOUNTER — Encounter: Payer: Self-pay | Admitting: Dietician

## 2016-02-03 VITALS — BP 142/77 | HR 72 | Temp 97.7°F | Resp 16 | Wt 153.1 lb

## 2016-02-03 DIAGNOSIS — F4321 Adjustment disorder with depressed mood: Secondary | ICD-10-CM

## 2016-02-03 DIAGNOSIS — Z720 Tobacco use: Secondary | ICD-10-CM

## 2016-02-03 DIAGNOSIS — D61810 Antineoplastic chemotherapy induced pancytopenia: Secondary | ICD-10-CM

## 2016-02-03 DIAGNOSIS — R112 Nausea with vomiting, unspecified: Secondary | ICD-10-CM | POA: Insufficient documentation

## 2016-02-03 DIAGNOSIS — L739 Follicular disorder, unspecified: Secondary | ICD-10-CM | POA: Insufficient documentation

## 2016-02-03 DIAGNOSIS — B370 Candidal stomatitis: Secondary | ICD-10-CM | POA: Insufficient documentation

## 2016-02-03 DIAGNOSIS — K766 Portal hypertension: Secondary | ICD-10-CM

## 2016-02-03 DIAGNOSIS — K7460 Unspecified cirrhosis of liver: Secondary | ICD-10-CM | POA: Diagnosis not present

## 2016-02-03 DIAGNOSIS — Z95828 Presence of other vascular implants and grafts: Secondary | ICD-10-CM | POA: Insufficient documentation

## 2016-02-03 DIAGNOSIS — C090 Malignant neoplasm of tonsillar fossa: Secondary | ICD-10-CM | POA: Insufficient documentation

## 2016-02-03 DIAGNOSIS — R05 Cough: Secondary | ICD-10-CM | POA: Insufficient documentation

## 2016-02-03 DIAGNOSIS — Z716 Tobacco abuse counseling: Secondary | ICD-10-CM

## 2016-02-03 DIAGNOSIS — F329 Major depressive disorder, single episode, unspecified: Secondary | ICD-10-CM

## 2016-02-03 DIAGNOSIS — C099 Malignant neoplasm of tonsil, unspecified: Secondary | ICD-10-CM | POA: Insufficient documentation

## 2016-02-03 MED ORDER — LORAZEPAM 0.5 MG PO TABS: 0.5000 mg | ORAL_TABLET | Freq: Four times a day (QID) | ORAL | 0 refills | Status: DC | PRN

## 2016-02-03 MED ORDER — NICOTINE 21 MG/24HR TD PT24: 21.0000 mg | MEDICATED_PATCH | Freq: Every day | TRANSDERMAL | 0 refills | Status: DC

## 2016-02-03 MED ORDER — ONDANSETRON 8 MG PO TBDP: 8.0000 mg | ORAL_TABLET | Freq: Three times a day (TID) | ORAL | 2 refills | Status: DC | PRN

## 2016-02-03 MED ORDER — FENTANYL 12 MCG/HR TD PT72: 12.5000 ug | MEDICATED_PATCH | TRANSDERMAL | 0 refills | Status: DC

## 2016-02-03 NOTE — Progress Notes (Signed)
[facility] NOTE  Patient Care Team: Celedonio Savage, MD as PCP - General (Family Medicine) Eppie Gibson, MD as Attending Physician (Radiation Oncology) Patrici Ranks, MD as Consulting Physician (Hematology and Oncology)  CHIEF COMPLAINTS:  Invasive squamous cell carcinoma of the Left tonsil, moderately differentiated, p16 positive Stage IVA T2N2bM0 Cirrhosis, portal hypertension. He is seen at [facility], Hepatitis C negative CT neck with contrast 06/25/2015 at [place] level 2 malignant LAD, largest node is 3.5 cm long axis. R worse than L carotid atherosclerosis and stenosis. CT chest with no acute or significant findings within the chest    Cancer of tonsillar fossa ([place])   08/02/2015 Initial Diagnosis    Cancer of tonsillar fossa ([place])      08/20/2015 Procedure    Port-a-cath placed by IR      08/20/2015 Procedure    G-tube placed by IR      08/21/2015 - 10/09/2015 Radiation Therapy    70 Gy, Dr. Isidore Moos.      08/21/2015 - 09/10/2015 Chemotherapy    Cisplatin every 21 days with XRT.      08/28/2015 Adverse Reaction    Tinnitis, Cisplatin-induced.      09/11/2015 - 09/30/2015 Chemotherapy    Carboplatin/5FU      09/20/2015 - 09/21/2015 Hospital Admission    Intractable nausea/vomiting. Severe Mucositis      10/17/2015 - 10/20/2015 Hospital Admission    Esophageal/oral candidasis. Shingles. Fever. Chemotherapy induced pancytopenia      01/24/2016 PET scan    Resolution of the left tonsillar mass and left neck adenopathy. No findings for residual or locally recurrent tumor, adenopathy or distant metastatic disease.       HISTORY OF PRESENTING ILLNESS:  Frank Castillo 65 y.o. male is here for further follow-up of Stage IVA T2N2bM0 squamous cell Ca of the L tonsil. Left tonsillectomy performed on 07/09/2015 with Dr. Benjamine Mola. He has completed therapy.   Frank Castillo is unaccompanied.   The patient saw Dr. Isidore Moos last Tuesday and  discussed recent PET results, which are excellent.  He has not stopped the fentanyl patch yet. He has 4 left. He picked up a new prescription for oxy recently. He reports pain that ibuprofen wouldn't touch.  He set a quit date to stop smoking of the 19th.  He is down to about 10 Zofran which he still uses occasionally. He has about 20 Ativan left. He notes that he continues to see improvement week to week.   His feeding tube is okay. He uses it maybe once daily and flushes it daily. He has gained 3 lbs since 01/15/2016. Most of his food intake is now oral.  Mood is much improved. He is off wellbutrin.  MEDICAL HISTORY:  Past Medical History:  Diagnosis Date  . Cirrhosis ([place])   . Hypertension   . Portal hypertension ([place])   . Tonsillar mass    left    SURGICAL HISTORY: Past Surgical History:  Procedure Laterality Date  . HERNIA REPAIR Right   . MOUTH SURGERY    . TONSILLECTOMY Left 07/09/2015   Procedure: LEFT TONSILLECTOMY;  Surgeon: Leta Baptist, MD;  Location: [facility];  Service: ENT;  Laterality: Left;    SOCIAL HISTORY: Social History   Social History  . Marital status: Single    Spouse name: N/A  . Number of children: 0  . Years of education: N/A   Occupational History  . Not on file.  Social History Main Topics  . Smoking status: Current Every Day Smoker    Packs/day: 0.50    Types: Cigarettes  . Smokeless tobacco: Never Used  . Alcohol use No     Comment: former abuse- quit 09/2011  . Drug use: No  . Sexual activity: Not on file   Other Topics Concern  . Not on file   Social History Narrative   Divorced.   No children.   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   Currently smoking approximately one pack per day.   Has been smoking off/on since age 62.   Divorced No children. Just step-children. 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 Previously worked in [employer]. Originally from [place],  [place]. Current smoker at 1 ppd. Began smoking at [age] yo, regularly smoking at [age] yo. During his "cocaine years" he smoked about 2 ppd. ETOH, recovering alcoholic. 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 He enjoys reading. 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  FAMILY HISTORY: Family History  Problem Relation Age of Onset  . Macular degeneration Mother   . Alcoholism Father    indicated that his mother is alive. He indicated that his father is deceased.   Mother still living at 65 years-old, with macular degeneration. She has no other health issues. Father deceased. Parents divorced at 10 years-old, so he is not sure what he died from. Father was an alcoholic. 4 sisters. 1 sister died of suicide. 1 sister was adopted. 1 Sister lives in [place]. 1 Sister lives in [place].  ALLERGIES:  is allergic to bee venom and other.  MEDICATIONS:  Current Outpatient Prescriptions  Medication Sig Dispense Refill  . buPROPion (WELLBUTRIN XL) 150 MG 24 hr tablet Take once daily in the am, after 5 days increase to two tablets 60 tablet 3  . cetirizine (ZYRTEC) 10 MG tablet Take 10 mg by mouth daily.    . cholecalciferol (VITAMIN D) 1000 units tablet Take 1,000 Units by mouth daily.    Marland Kitchen escitalopram (LEXAPRO) 20 MG tablet Take 1/2 tablet for 1 week then increase to 1 tablet daily. 30 tablet 1  . fentaNYL (DURAGESIC - DOSED MCG/HR) 12 MCG/HR Place 1 patch (12.5 mcg total) onto the skin every 3 (three) days. 10 patch 0  . LORazepam (ATIVAN) 0.5 MG tablet Take 1 tablet (0.5 mg total) by mouth every 6 (six) hours as needed for anxiety. 45 tablet 0  . metoCLOPramide (REGLAN) 5 MG tablet Take 1 tablet (5 mg total) by mouth 4 (four) times daily -  before meals and at bedtime. 240 tablet 1  . nicotine (NICODERM CQ - DOSED IN MG/24 HOURS) 21 mg/24hr patch Place 1 patch (21 mg total) onto the skin daily. 28 patch 0  . Nutritional Supplements (JEVITY 1.2 CAL/FIBER) LIQD 8 cans/day.  4 feeding of 2 cans each. Flush with 100  mls before and after each feeding 1000 mL 6  . ondansetron (ZOFRAN ODT) 8 MG disintegrating tablet Take 1 tablet (8 mg total) by mouth every 8 (eight) hours as needed for nausea or vomiting. 30 tablet 2  . oxyCODONE (ROXICODONE) 5 MG/5ML solution Take 5-10 mLs (5-10 mg total) by mouth every 4 (four) hours as needed for severe pain. 250 mL 0  . pilocarpine (SALAGEN) 5 MG tablet Take 1 tablet (5 mg total) by mouth 3 (three) times daily. 90 tablet 3  . sodium fluoride (FLUORISHIELD) 1.1 % GEL dental gel Instill one drop of gel per tooth space  of fluoride tray. Place over teeth for 5 minutes. Remove. Spit out excess. Repeat nightly. 125 mL 2   No current facility-administered medications for this visit.    Facility-Administered Medications Ordered in Other Visits  Medication Dose Route Frequency Provider Last Rate Last Dose  . 0.9 %  sodium chloride infusion   Intravenous Once [employer], PA-C      . 0.9 %  sodium chloride infusion   Intravenous Continuous Manon Hilding Kefalas, PA-C      . 0.9 %  sodium chloride infusion   Intravenous Continuous Thomas S Kefalas, PA-C      . 0.9 %  sodium chloride infusion   Intravenous Continuous Shannon K Penland, MD      . 0.9 %  sodium chloride infusion   Intravenous Continuous Kelby Fam Penland, MD      . 0.9 %  sodium chloride infusion   Intravenous Continuous Patrici Ranks, MD        Review of Systems  Constitutional: Positive for malaise/fatigue. Negative for fever, chills.  HENT: Positive for sore throat. Negative for congestion, hearing loss and nosebleeds.   Dry mouth Eyes: Negative.  Negative for blurred vision, double vision, pain and discharge.  Respiratory: Negative for cough, hemoptysis, shortness of breath and wheezing.   Cardiovascular: Negative.  Negative for chest pain, palpitations, claudication, leg swelling and PND.  Gastrointestinal: Negative for heartburn, vomiting, abdominal pain, blood in stool and melena.  Genitourinary:  Negative.  Negative for dysuria, urgency, frequency and hematuria.  Musculoskeletal: Negative.  Negative for myalgias, joint pain and falls.  Skin: Negative  Neurological: Negative.  Negative for dizziness, tingling, tremors, sensory change, speech change, focal weakness, seizures, loss of consciousness, weakness and headaches.  Endo/Heme/Allergies: Negative.  Does not bruise/bleed easily.  Psychiatric/Behavioral: Positive for insomnia, improving.  Negative for depression, suicidal ideas, memory loss and substance abuse. The patient is not nervous/anxious. All other systems reviewed and are negative. 14 point ROS was done and is otherwise as detailed above or in HPI   PHYSICAL EXAMINATION: ECOG PERFORMANCE STATUS: 1 - Symptomatic but completely ambulatory  Vitals:   02/03/16 1157  BP: (!) 142/77  Pulse: 72  Resp: 16  Temp: 97.7 F (36.5 C)    Physical Exam  Constitutional: He is oriented to person, place, and time  and in no distress.  HENT:  Head: Normocephalic and atraumatic.  Nose: Nose normal.  Mouth/Throat: No mucositis. Oropharynx clear. Tongue with normal ROM Eyes: Conjunctivae and EOM are normal. Pupils are equal, round, and reactive to light. Right eye exhibits no discharge. Left eye exhibits no discharge. No scleral icterus.  Neck: Normal range of motion. Neck supple. No tracheal deviation present. No thyromegaly present. Lymphedema noted.  Cardiovascular: Normal rate, regular rhythm and normal heart sounds.  Exam reveals no gallop and no friction rub.  Port site is C/D/I no erythema or bruising No murmur heard.  Pulmonary/Chest: Effort normal and breath sounds normal. He has no wheezes. He has no rales.  Abdominal: Soft. Bowel sounds are normal. He exhibits no distension and no mass. There is no tenderness. There is no rebound and no guarding. FT site is C/D/I, no erythema Musculoskeletal: Normal range of motion. He exhibits no edema.  Neurological: He is alert and  oriented to person, place, and time. He has normal reflexes. No cranial nerve deficit. Gait normal. Coordination normal.  Skin: Skin is warm and dry.  Psychiatric: Mood, memory, affect and judgment normal.  Nursing note and vitals  reviewed.   LABORATORY DATA:  I have reviewed the data as listed Results for LLOYDE, LUDLAM (MRN [id number]) as of 02/03/2016 12:12  Ref. Range 12/27/2015 08:45 01/24/2016 09:40  Glucose-Capillary Latest Ref Range: 65 - 99 mg/dL  108 (H)  Sodium Latest Ref Range: 135 - 145 mmol/L 133 (L)   Potassium Latest Ref Range: 3.5 - 5.1 mmol/L 4.2   Chloride Latest Ref Range: 101 - 111 mmol/L 103   CO2 Latest Ref Range: 22 - 32 mmol/L 27   BUN Latest Ref Range: 6 - 20 mg/dL 13   Creatinine Latest Ref Range: 0.61 - 1.24 mg/dL 0.62   Calcium Latest Ref Range: 8.9 - 10.3 mg/dL 9.1   EGFR (Non-African Amer.) Latest Ref Range: >60 mL/min >60   EGFR (African American) Latest Ref Range: >60 mL/min >60   Glucose Latest Ref Range: 65 - 99 mg/dL 88   Anion gap Latest Ref Range: 5 - 15  3 (L)   Alkaline Phosphatase Latest Ref Range: 38 - 126 U/L 48   Albumin Latest Ref Range: 3.5 - 5.0 g/dL 4.1   AST Latest Ref Range: 15 - 41 U/L 14 (L)   ALT Latest Ref Range: 17 - 63 U/L 12 (L)   Total Protein Latest Ref Range: 6.5 - 8.1 g/dL 6.8   Total Bilirubin Latest Ref Range: 0.3 - 1.2 mg/dL 0.4   WBC Latest Ref Range: 4.0 - 10.5 K/uL 6.4   RBC Latest Ref Range: 4.22 - 5.81 MIL/uL 3.89 (L)   Hemoglobin Latest Ref Range: 13.0 - 17.0 g/dL 12.3 (L)   HCT Latest Ref Range: 39.0 - 52.0 % 37.3 (L)   MCV Latest Ref Range: 78.0 - 100.0 fL 95.9   MCH Latest Ref Range: 26.0 - 34.0 pg 31.6   MCHC Latest Ref Range: 30.0 - 36.0 g/dL 33.0   RDW Latest Ref Range: 11.5 - 15.5 % 14.0   Platelets Latest Ref Range: 150 - 400 K/uL 201   Neutrophils Latest Units: % 78   Lymphocytes Latest Units: % 5   Monocytes Relative Latest Units: % 13   Eosinophil Latest Units: % 3   Basophil Latest Units: % 1    NEUT# Latest Ref Range: 1.7 - 7.7 K/uL 5.0   Lymphocyte # Latest Ref Range: 0.7 - 4.0 K/uL 0.3 (L)   Monocyte # Latest Ref Range: 0.1 - 1.0 K/uL 0.8   Eosinophils Absolute Latest Ref Range: 0.0 - 0.7 K/uL 0.2   Basophils Absolute Latest Ref Range: 0.0 - 0.1 K/uL 0.1   TSH Latest Ref Range: 0.350 - 4.500 uIU/mL 1.338   T4,Free(Direct) Latest Ref Range: 0.61 - 1.12 ng/dL 1.04     RADIOGRAPHIC STUDIES: I have personally reviewed the radiological reports as listed. Study Result   CLINICAL DATA:  Subsequent treatment strategy for tonsillar carcinoma.  EXAM: NUCLEAR MEDICINE PET SKULL BASE TO THIGH  TECHNIQUE: Seven point for mCi F-18 FDG was injected intravenously. Full-ring PET imaging was performed from the skull base to thigh after the radiotracer. CT data was obtained and used for attenuation correction and anatomic localization.  FASTING BLOOD GLUCOSE:  Value: 103 mg/dl  COMPARISON:  PET-CT 07/30/2015 and prior neck CT 06/25/2015  FINDINGS: NECK  No areas of hypermetabolism identified in the left tonsillar area. The enlarged and hypermetabolic left-sided level 2 lymph node has resolved. No enlarged or hypermetabolic neck nodes. No supraclavicular adenopathy.  CHEST  No hypermetabolic mediastinal or hilar nodes. No suspicious pulmonary nodules on the CT scan.  ABDOMEN/PELVIS  No abnormal hypermetabolic activity within the liver, pancreas, adrenal glands, or spleen. No hypermetabolic lymph nodes in the abdomen or pelvis.  A gastric feeding tube is noted. Stable atherosclerotic calcifications involving the aorta and branch vessels.  SKELETON  No focal hypermetabolic activity to suggest skeletal metastasis.  IMPRESSION: Resolution of the left tonsillar mass and left neck adenopathy. No findings for residual or locally recurrent tumor, adenopathy or distant metastatic disease.   Electronically Signed   By: Marijo Sanes M.D.   On: 01/24/2016  12:07    PATHOLOGY   ASSESSMENT & PLAN:  Invasive squamous cell carcinoma of the Left tonsil, moderately differentiated, p16 positive Cirrhosis, portal hypertension. He is seen at [facility], Hepatitis C negative CT neck with contrast 06/25/2015 at [place] level 2 malignant LAD, largest node is 3.5 cm long axis. R worse than L carotid atherosclerosis and stenosis. CT chest with no acute or significant findings within the chest [facility] stay secondary to intractable n/v and mucositis Oropharyngeal candidiasis/esophageal candidiasis Odynophagia Fever Shingles Reactive Depression Chemotherapy induced pancytopenia Smoking cessation counseling  He continues to improve. I have written for nicotine patches 21 mg for 2 weeks, he plans on decreasing the dose at this point. He has set a quit date for quitting cigarettes.   01/24/2016 PET reviewed. He has had an excellent response to therapy.   Weight gain 3 lbs since 01/15/2016. Most intake is orally. He met with Ovid Curd today.   He saw Dr. Isidore Moos last Tuesday.  I have refilled his fentanyl patch, ativan, and zofran. Mood is markedly improved.   RTC 3 weeks.  All questions were answered. The patient knows to call the clinic with any problems, questions or concerns.  This document serves as a record of services personally performed by Ancil Linsey, MD. It was created on her behalf by Arlyce Harman, a trained medical scribe. The creation of this record is based on the scribe's personal observations and the provider's statements to them. This document has been checked and approved by the attending provider.  I have reviewed the above documentation for accuracy and completeness, and I agree with the above.  This note was electronically signed.    Molli Hazard, MD  02/03/2016 12:15 PM

## 2016-02-03 NOTE — Progress Notes (Signed)
Follow up with H&N patient who is s/p chemoradiation and under going rehabilitation   Contacted Pt by visiting prior to office visit  Wt Readings from Last 10 Encounters:  02/03/16 153 lb 1.6 oz (69.4 kg)  01/15/16 150 lb 12.8 oz (68.4 kg)  12/27/15 153 lb 11.2 oz (69.7 kg)  12/17/15 155 lb 6.4 oz (70.5 kg)  11/28/15 154 lb 12.8 oz (70.2 kg)  11/05/15 162 lb 3.2 oz (73.6 kg)  10/22/15 161 lb 6.4 oz (73.2 kg)  10/17/15 170 lb 6.1 oz (77.3 kg)  10/17/15 170 lb 9.6 oz (77.4 kg)  10/14/15 164 lb 3.2 oz (74.5 kg)   Patient weight has increased by 2.5 lbs since last visit 3 weeks ago.   Patient reports oral intake as improving.   Pt's number one complaint had been his dry mouth. Today, he reports improvement. He says he has been using OTC Xylimelts at night. He places them on his gums and goes to sleep. He reports not waking up nearly as frequently due to his dry mouth.  His lack of taste has improved as well.   He notes that his mouth gets fatigues from chewing, likely related to prolonged period of disuse and poor compliance with excercise.   Advised to continue eating and these muscle should re strengthen with time. He states he was making chewing motion while he was driving to clinic.   He has been using the Ensure supplements that have been provided. He is drinking 1-2 a day. He is also still doing his smoothies and weigh protein powder.   Tube feed wise, he is not really on a strict regimen anymore. Typically, he is only doing 1 feeding of 2 cans daily. Sometimes, he states, he will infuse 2 x a day, 2 cans each. He states he subjectively determines if he has eaten enough to warrant only 2 cans or if he hasnt eaten enough and should infuse 4 cans. He says he still flushes the tube though throughout the day, no set quantity. He denies s/s of dehydration and reports very frequent urination.   Depression, which was a major limiting factor in his recovery, appears to have improved. He  states, "I have surrendered" to the idea that his recovery will be a long one and he will not immediately be as he was before treatment. 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   He comments that his true UBW is 165 lbs, not the 185 lbs he started at during treatment. 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 Because of this, he states that "everyone should not be so worried about my weight because my normal weight was not 185". RD explained the concern was more so that he kept losing weight, not the actual number. If his weight was to just stabilize where he is now, RD would be very pleased.    Patients xerostomia, loss of taste, sleep and mood all appear to have improved. He is still infusing ~2 cans a day. He is asked to continue this until more wt gain is seen. RD will continue to follow. Hopefully at next f/u the Tube feeding can be stopped completely.   Pt seemed to be in a better mood today. We had a very genial conversation today about his treatment course. He repeatedly voiced his thankfulness for the care he received.  Will order his 3rd and final case of Ensure   [name] RD, LDN, [facility] Pager: [phone] 02/03/2016 1:25 PM

## 2016-02-03 NOTE — Patient Instructions (Addendum)
[place] at [facility] Discharge Instructions  RECOMMENDATIONS MADE BY THE CONSULTANT AND ANY TEST RESULTS WILL BE SENT TO YOUR REFERRING PHYSICIAN.  You saw Dr. Whitney Muse today.  Return to clinic in 3 weeks with labs.  Thank you for choosing [place] at [facility] to provide your oncology and hematology care.  To afford each patient quality time with our provider, please arrive at least 15 minutes before your scheduled appointment time.   Beginning January 23rd 2017 lab work for the [facility] will be done in the  Main lab at [facility] on 1st floor. If you have a lab appointment with the [facility] please come in thru the  Main Entrance and check in at the main information desk  You need to re-schedule your appointment should you arrive 10 or more minutes late.  We strive to give you quality time with our providers, and arriving late affects you and other patients whose appointments are after yours.  Also, if you no show three or more times for appointments you may be dismissed from the clinic at the providers discretion.     Again, thank you for choosing [facility].  Our hope is that these requests will decrease the amount of time that you wait before being seen by our physicians.       _____________________________________________________________  Should you have questions after your visit to [facility], please contact our office at [phone] between the hours of 8:30 a.m. and 4:30 p.m.  Voicemails left after 4:30 p.m. will not be returned until the following business day.  For prescription refill requests, have your pharmacy contact our office.         Resources For Cancer Patients and their Caregivers ? American Cancer Society: Can assist with transportation, wigs, general needs, runs Look Good Feel Better.        ([phone] 137-7758 ? Cancer Care: Provides financial assistance, online support  groups, medication/co-pay assistance.  1-800-813-HOPE 6694606147) ? [place] Assists [place] cancer patients and their families through emotional , educational and financial support.  (936)816-4773 ? [place] DSS Where to apply for food stamps, Medicaid and utility assistance. (985) 111-6878 ? RCATS: Transportation to medical appointments. 5751543614 ? Social Security Administration: May apply for disability if have a Stage IV cancer. 478-799-9139 (807)497-3952 ? [employer], Disability and Transit Services: Assists with nutrition, care and transit needs. [place] Support Programs: @10RELATIVEDAYS @ > Cancer Support Group  2nd Tuesday of the month 1pm-2pm, Journey Room  > Creative Journey  3rd Tuesday of the month 1130am-1pm, Journey Room  > Look Good Feel Better  1st Wednesday of the month 10am-12 noon, Journey Room (Call [place] to register [phone])

## 2016-02-04 ENCOUNTER — Encounter (HOSPITAL_COMMUNITY): Payer: Self-pay | Admitting: Hematology & Oncology

## 2016-02-12 ENCOUNTER — Other Ambulatory Visit (HOSPITAL_COMMUNITY): Payer: Self-pay | Admitting: Emergency Medicine

## 2016-02-12 ENCOUNTER — Telehealth (HOSPITAL_COMMUNITY): Payer: Self-pay | Admitting: Emergency Medicine

## 2016-02-12 DIAGNOSIS — C090 Malignant neoplasm of tonsillar fossa: Secondary | ICD-10-CM

## 2016-02-12 MED ORDER — OXYCODONE HCL 5 MG/5ML PO SOLN: 5.0000 mg | ORAL | 0 refills | Status: DC | PRN

## 2016-02-12 NOTE — Telephone Encounter (Signed)
Wants a refill on oxycodone.  Refilled and he can pick it up tomorrow.

## 2016-02-13 ENCOUNTER — Telehealth (HOSPITAL_COMMUNITY): Payer: Self-pay | Admitting: *Deleted

## 2016-02-13 ENCOUNTER — Encounter: Payer: Self-pay | Admitting: Dietician

## 2016-02-17 ENCOUNTER — Telehealth (HOSPITAL_COMMUNITY): Payer: Self-pay | Admitting: Emergency Medicine

## 2016-02-17 NOTE — Telephone Encounter (Signed)
Pt called stated that his right ear was stopped up, feels like its ringing more and cant hear out of that ear as good.  Starting to get achy. He takes zyrtec daily.  Spole with Dr Whitney Muse and she said to get a good OTC cold/allergy medication and try it for a few days, if not better to call us back.  Pt verbalized understanding.

## 2016-02-18 ENCOUNTER — Encounter (HOSPITAL_COMMUNITY): Payer: Self-pay | Admitting: Speech Pathology

## 2016-02-18 ENCOUNTER — Ambulatory Visit (HOSPITAL_COMMUNITY): Payer: BLUE CROSS/BLUE SHIELD | Attending: Hematology & Oncology | Admitting: Speech Pathology

## 2016-02-18 DIAGNOSIS — R1312 Dysphagia, oropharyngeal phase: Secondary | ICD-10-CM | POA: Diagnosis present

## 2016-02-18 NOTE — Therapy (Signed)
[place], [place], [address] Phone: [phone]   Fax:  [phone]  Speech Language Pathology Treatment  Patient Details  Name: Frank Castillo MRN: [id number] Date of Birth: [birth year] No Data Recorded  Encounter Date: 02/18/2016      End of Session - 02/18/16 1115    Visit Number 6   Number of Visits 7   Authorization Type BCBS   Authorization Time Period through 03/26/2016   SLP Start Time 0945   SLP Stop Time  [id number]   SLP Time Calculation (min) 55 min   Activity Tolerance Patient tolerated treatment well      Past Medical History:  Diagnosis Date  . Cirrhosis ([place])   . Hypertension   . Portal hypertension ([place])   . Tonsillar mass    left    Past Surgical History:  Procedure Laterality Date  . HERNIA REPAIR Right   . MOUTH SURGERY    . TONSILLECTOMY Left 07/09/2015   Procedure: LEFT TONSILLECTOMY;  Surgeon: Leta Baptist, MD;  Location: [facility];  Service: ENT;  Laterality: Left;    There were no vitals filed for this visit.      Subjective Assessment - 02/18/16 1112    Subjective "I haven't really been using the Jevity."   Currently in Pain? No/denies               ADULT SLP TREATMENT - 02/18/16 1112      General Information   Behavior/Cognition Alert;Cooperative;Pleasant mood   Patient Positioning Upright in chair   Oral care provided N/A   HPI Frank Castillo 65 y.o. male referred by Dr. 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 Pt with mucositis oral and oral candidiasis and treated with nystatin (MYCOSTATIN) 100000 UNIT/ML suspension. Pt will continue with IV fluids three times weekly. Left tonsillectomy 07/09/2015 and PEG placed 08/20/2015. Pt  anticipates that chemo will conclude ~10/22/2015 and radiation ~10/09/2015. Pt referred for swallowing evaluation due to risk of aspiration and malnutrition in setting of head and neck cancer treatment.      Treatment Provided   Treatment provided Dysphagia     Dysphagia Treatment   Temperature Spikes Noted No   Respiratory Status Room air   Oral Cavity - Dentition Adequate natural dentition   Treatment Methods Skilled observation;Therapeutic exercise;Compensation strategy training;Patient/caregiver education   Patient observed directly with PO's Yes   Type of PO's observed Thin liquids   Feeding Able to feed self   Liquids provided via Cup   Amount of cueing Independent     Pain Assessment   Pain Assessment No/denies pain     Assessment / Recommendations / Plan   Plan Continue with current plan of care     Dysphagia Recommendations   Diet recommendations Dysphagia 3 (mechanical soft)   Liquids provided via Cup;Straw   Medication Administration Whole meds with liquid   Supervision Patient able to self feed   Compensations Slow rate;Small sips/bites;Multiple dry swallows after each bite/sip;Follow solids with liquid   Postural Changes and/or Swallow Maneuvers Out of bed for meals;Seated upright 90 degrees;Upright 30-60 min after meal     General Recommendations   Oral Care Recommendations Patient independent with oral care;Oral care before and after PO   Follow up Recommendations Outpatient SLP     Progression Toward  Goals   Progression toward goals Progressing toward goals          SLP Education - 02/18/16 1114    Education provided Yes   Education Details continuation of swallowing exercises, weight checks   Person(s) Educated Patient   Methods Explanation   Comprehension Verbalized understanding          SLP Short Term Goals - 02/18/16 1117      SLP SHORT TERM GOAL #1   Title Pt will complete swallowing exercises 3x/day 7 days a week with use of written cues  and pt/caregiver report.    Baseline not completing   Time 2   Period Months   Status On-going     SLP SHORT TERM GOAL #2   Title Pt will increase oral intake of all consistencies and textures to regular/thin for 3 small meals a day to decrease dependency of PEG and facilitate safe swallow function going forward.    Baseline sipping liquids only   Time 2   Period Months   Status On-going          SLP Long Term Goals - 02/18/16 1118      SLP LONG TERM GOAL #1   Title Pt will demonstrate safe and efficient consumption of self regulated regular textures with use of compensatory strategies as needed.   Baseline Pt only drinking liquids at this time due to odynophagia   Time 2   Period Months   Status On-going          Plan - 02/18/16 1116    Clinical Impression Statement Frank Castillo was unaccompanied to today's appointment. Weight check completed this date per RD request (150.6#), which indicates a 3 lb weight loss from when seen at Cancer clinic two weeks ago. He is not overly concerned as he feels that his ideal body weight may be closer to 165# versus the reported 180#. He also has not been using PEG for Jevity. SLP reviewed RD's recommendations for continued two cans Jevity until seen next week by RD. Pt states that he will go back to taking Jevity. He wonders when he should stop taking [place] since his salivary production seems to be improving. SLP advised him to discuss with Dr. Whitney Muse at next visit. He continues to report disruptive sleep (waking every 2 hours to use bathroom), but reports this is better than it was. He uses the tongue depressors given to him by Dr. Enrique Sack for trismus and notes that he feels a difference if he does not use them regularly. SLP encouraged pt to continue using these and to continue with swallowing exercises going forward indefinitely. Would like to see pt one more visit in about a month to ensure follow through of exercises and tolerance of po  intake for hopeful PEG removal.   Speech Therapy Frequency Monthly   Duration --  1 more visit in 4 weeks   Treatment/Interventions Aspiration precaution training;Pharyngeal strengthening exercises;Diet toleration management by SLP;SLP instruction and feedback;Compensatory strategies;Patient/family education   Potential to Achieve Goals Good   SLP Home Exercise Plan Pt will be independent with HEP as assigned to facilitate carryover of treatment strategies at home   Consulted and Agree with Plan of Care Patient      Patient will benefit from skilled therapeutic intervention in order to improve the following deficits and impairments:   Dysphagia, oropharyngeal phase    Problem List Patient Active Problem List   Diagnosis Date Noted  . Dysuria 10/25/2015  . Decreased oral  intake 10/25/2015  . Malnutrition of moderate degree 10/19/2015  . Antineoplastic chemotherapy induced pancytopenia ([place]) 10/18/2015  . Fever   . Candidiasis of mouth and esophagus ([place]) 10/17/2015  . Neutropenia, febrile ([place]) 10/17/2015  . Shingles rash 10/17/2015  . Nausea with vomiting 09/20/2015  . Hyponatremia 09/20/2015  . Oral pharyngeal candidiasis 09/20/2015  . Antineoplastic chemotherapy induced anemia 09/20/2015  . Hypertension   . Cirrhosis ([place])   . Portal hypertension ([place])   . Cancer of tonsillar fossa [facility]) 08/02/2015   Thank you,  Genene Churn, [place]  [facility] 02/18/2016, 11:21 AM  [facility] [address] [place], [place], [address] Phone: [phone]   Fax:  [phone]   Name: Frank Castillo MRN: [id number] Date of Birth: 08-31-1950

## 2016-02-21 ENCOUNTER — Encounter (HOSPITAL_COMMUNITY): Payer: Self-pay

## 2016-02-24 ENCOUNTER — Encounter (HOSPITAL_COMMUNITY): Payer: Self-pay | Admitting: Emergency Medicine

## 2016-02-24 ENCOUNTER — Other Ambulatory Visit (HOSPITAL_COMMUNITY): Payer: Self-pay | Admitting: Emergency Medicine

## 2016-02-24 DIAGNOSIS — C090 Malignant neoplasm of tonsillar fossa: Secondary | ICD-10-CM

## 2016-02-24 MED ORDER — OXYCODONE HCL 5 MG/5ML PO SOLN: 5.0000 mg | ORAL | 0 refills | Status: DC | PRN

## 2016-02-24 NOTE — Progress Notes (Signed)
Oxycodone refilled.

## 2016-02-27 ENCOUNTER — Telehealth (HOSPITAL_COMMUNITY): Payer: Self-pay | Admitting: *Deleted

## 2016-02-27 ENCOUNTER — Encounter (HOSPITAL_BASED_OUTPATIENT_CLINIC_OR_DEPARTMENT_OTHER): Payer: BLUE CROSS/BLUE SHIELD

## 2016-02-27 ENCOUNTER — Encounter (HOSPITAL_COMMUNITY): Payer: Self-pay | Admitting: Hematology & Oncology

## 2016-02-27 ENCOUNTER — Encounter (HOSPITAL_COMMUNITY): Payer: BLUE CROSS/BLUE SHIELD | Attending: Hematology & Oncology | Admitting: Hematology & Oncology

## 2016-02-27 ENCOUNTER — Encounter: Payer: Self-pay | Admitting: Dietician

## 2016-02-27 VITALS — BP 131/79 | HR 81 | Temp 97.8°F | Resp 20 | Wt 152.1 lb

## 2016-02-27 DIAGNOSIS — H9319 Tinnitus, unspecified ear: Secondary | ICD-10-CM | POA: Insufficient documentation

## 2016-02-27 DIAGNOSIS — Z95828 Presence of other vascular implants and grafts: Secondary | ICD-10-CM | POA: Diagnosis present

## 2016-02-27 DIAGNOSIS — Z23 Encounter for immunization: Secondary | ICD-10-CM | POA: Diagnosis not present

## 2016-02-27 DIAGNOSIS — K7460 Unspecified cirrhosis of liver: Secondary | ICD-10-CM | POA: Diagnosis not present

## 2016-02-27 DIAGNOSIS — B370 Candidal stomatitis: Secondary | ICD-10-CM | POA: Insufficient documentation

## 2016-02-27 DIAGNOSIS — C090 Malignant neoplasm of tonsillar fossa: Secondary | ICD-10-CM | POA: Insufficient documentation

## 2016-02-27 DIAGNOSIS — Z452 Encounter for adjustment and management of vascular access device: Secondary | ICD-10-CM | POA: Diagnosis not present

## 2016-02-27 DIAGNOSIS — D61810 Antineoplastic chemotherapy induced pancytopenia: Secondary | ICD-10-CM

## 2016-02-27 DIAGNOSIS — H9311 Tinnitus, right ear: Secondary | ICD-10-CM

## 2016-02-27 DIAGNOSIS — R05 Cough: Secondary | ICD-10-CM | POA: Insufficient documentation

## 2016-02-27 DIAGNOSIS — C099 Malignant neoplasm of tonsil, unspecified: Secondary | ICD-10-CM

## 2016-02-27 DIAGNOSIS — L739 Follicular disorder, unspecified: Secondary | ICD-10-CM | POA: Diagnosis present

## 2016-02-27 DIAGNOSIS — B029 Zoster without complications: Secondary | ICD-10-CM

## 2016-02-27 DIAGNOSIS — K766 Portal hypertension: Secondary | ICD-10-CM | POA: Diagnosis not present

## 2016-02-27 DIAGNOSIS — R112 Nausea with vomiting, unspecified: Secondary | ICD-10-CM | POA: Insufficient documentation

## 2016-02-27 DIAGNOSIS — F17200 Nicotine dependence, unspecified, uncomplicated: Secondary | ICD-10-CM | POA: Insufficient documentation

## 2016-02-27 DIAGNOSIS — K117 Disturbances of salivary secretion: Secondary | ICD-10-CM

## 2016-02-27 DIAGNOSIS — F329 Major depressive disorder, single episode, unspecified: Secondary | ICD-10-CM

## 2016-02-27 DIAGNOSIS — Y842 Radiological procedure and radiotherapy as the cause of abnormal reaction of the patient, or of later complication, without mention of misadventure at the time of the procedure: Secondary | ICD-10-CM

## 2016-02-27 LAB — CBC WITH DIFFERENTIAL/PLATELET
BASOS ABS: 0.1 10*3/uL (ref 0.0–0.1)
BASOS PCT: 1 %
Eosinophils Absolute: 0.1 10*3/uL (ref 0.0–0.7)
Eosinophils Relative: 1 %
HEMATOCRIT: 36.6 % — AB (ref 39.0–52.0)
HEMOGLOBIN: 12 g/dL — AB (ref 13.0–17.0)
LYMPHS PCT: 6 %
Lymphs Abs: 0.4 10*3/uL — ABNORMAL LOW (ref 0.7–4.0)
MCH: 30.1 pg (ref 26.0–34.0)
MCHC: 32.8 g/dL (ref 30.0–36.0)
MCV: 91.7 fL (ref 78.0–100.0)
Monocytes Absolute: 0.7 10*3/uL (ref 0.1–1.0)
Monocytes Relative: 11 %
NEUTROS ABS: 5.4 10*3/uL (ref 1.7–7.7)
NEUTROS PCT: 81 %
Platelets: 210 10*3/uL (ref 150–400)
RBC: 3.99 MIL/uL — AB (ref 4.22–5.81)
RDW: 13.5 % (ref 11.5–15.5)
WBC: 6.7 10*3/uL (ref 4.0–10.5)

## 2016-02-27 LAB — COMPREHENSIVE METABOLIC PANEL
ALBUMIN: 4 g/dL (ref 3.5–5.0)
ALK PHOS: 51 U/L (ref 38–126)
ALT: 12 U/L — AB (ref 17–63)
AST: 14 U/L — AB (ref 15–41)
Anion gap: 6 (ref 5–15)
BILIRUBIN TOTAL: 0.4 mg/dL (ref 0.3–1.2)
BUN: 20 mg/dL (ref 6–20)
CALCIUM: 9.2 mg/dL (ref 8.9–10.3)
CO2: 29 mmol/L (ref 22–32)
CREATININE: 0.88 mg/dL (ref 0.61–1.24)
Chloride: 99 mmol/L — ABNORMAL LOW (ref 101–111)
GFR calc Af Amer: >60 mL/min (ref >60)
GLUCOSE: 104 mg/dL — AB (ref 65–99)
POTASSIUM: 4 mmol/L (ref 3.5–5.1)
Sodium: 134 mmol/L — ABNORMAL LOW (ref 135–145)
TOTAL PROTEIN: 6.7 g/dL (ref 6.5–8.1)

## 2016-02-27 MED ORDER — HEPARIN SOD (PORK) LOCK FLUSH 100 UNIT/ML IV SOLN
500.0000 [IU] | Freq: Once | INTRAVENOUS | Status: AC
  Administered 2016-02-27: 500 [IU] via INTRAVENOUS
  Filled 2016-02-27: qty 5

## 2016-02-27 MED ORDER — ESCITALOPRAM OXALATE 20 MG PO TABS: ORAL_TABLET | ORAL | 3 refills | Status: DC

## 2016-02-27 MED ORDER — PILOCARPINE HCL 5 MG PO TABS: 10.0000 mg | ORAL_TABLET | Freq: Three times a day (TID) | ORAL | 3 refills | Status: DC

## 2016-02-27 MED ORDER — SODIUM CHLORIDE 0.9% FLUSH
10.0000 mL | INTRAVENOUS | Status: DC | PRN
  Administered 2016-02-27: 10 mL via INTRAVENOUS
  Filled 2016-02-27: qty 10

## 2016-02-27 MED ORDER — INFLUENZA VAC SPLIT QUAD 0.5 ML IM SUSY
0.5000 mL | PREFILLED_SYRINGE | Freq: Once | INTRAMUSCULAR | Status: AC
  Administered 2016-02-27: 0.5 mL via INTRAMUSCULAR
  Filled 2016-02-27: qty 0.5

## 2016-02-27 MED ORDER — LORAZEPAM 0.5 MG PO TABS: 0.5000 mg | ORAL_TABLET | Freq: Four times a day (QID) | ORAL | 0 refills | Status: DC | PRN

## 2016-02-27 NOTE — Progress Notes (Signed)
Follow up with H&N patient who is s/p chemoradiation and under going rehabilitation   Contacted Pt by visiting prior to office visit  Wt Readings from Last 10 Encounters:  02/27/16 152 lb 1.6 oz (69 kg)  02/03/16 153 lb 1.6 oz (69.4 kg)  01/15/16 150 lb 12.8 oz (68.4 kg)  12/27/15 153 lb 11.2 oz (69.7 kg)  12/17/15 155 lb 6.4 oz (70.5 kg)  11/28/15 154 lb 12.8 oz (70.2 kg)  11/05/15 162 lb 3.2 oz (73.6 kg)  10/22/15 161 lb 6.4 oz (73.2 kg)  10/17/15 170 lb 6.1 oz (77.3 kg)  10/17/15 170 lb 9.6 oz (77.4 kg)  Patient weight has seems to have stabilized at 150-155 lbs. Noted that he was weighed outpatient at [place] visit 10 days ago at 150 lbs.   Pt reports oral intake as fair and is still suffering from symptoms including taste loss, dysphagia, dry mouth, and lack of desire to eat. He says he eats 1 meal a day, but grazes throughout the day. He blends much of his items because he is able to drink well. He is doing many smoothies with PB, greek yogurt, and protein powder.   Tube feeding wise he has only been doing 2 cans of Jevity 1.2 daily. ST had noted on 9/26 pt had stopped his TF after last RD encounter. Pt was asked to restart, which he did, which may be why he went from 150 lbs to 152 lbs today, though there of course could be variation related to clothes, hydration status and scale variation.   It is difficult to ascertain exactly how much patient is eating because his pattern is very unstructured and variable. RD asked him if he honestly felt himself that he was able to eat enough to maintain weight without TF. He said yes and reports it is more or less a chore for him now and is only doing it because RD had asked.   Unfortunately, he does not report an increase in appetite when he doesn't do TF, so he wont have any additional internal que to eat when he stops his TF. However, pt believes he can account for this by drinking an additional 2 nutritional supplements daily.   After some  discussion, we will hold his TF and patient will now attempt to maintain weight with PO intake only. He has a ST appointment on the 24th. This will serve as his "weigh in" to see how he is doing.   He will still need to flush his tube. Recommended flushing w/ 60 cc after waking up, midday, and before bed to help keep tube patent.  He reports mostly compliance with ST excercises and says he notices when he goes a couple days w/o doing them because his lingual and labial ROM.   He is provided with many coupons and samples today. He knows to contact RD if he has any nutritional questions.   [name] RD, LDN, [place] [facility] Pager: [phone] 02/27/2016 12:19 PM

## 2016-02-27 NOTE — Patient Instructions (Signed)
[place] at [facility] Discharge Instructions  RECOMMENDATIONS MADE BY THE CONSULTANT AND ANY TEST RESULTS WILL BE SENT TO YOUR REFERRING PHYSICIAN.  You received the flu vaccination today.   Return to the clinic in three weeks for follow up with the MD.    Thank you for choosing [place] at [facility] to provide your oncology and hematology care.  To afford each patient quality time with our provider, please arrive at least 15 minutes before your scheduled appointment time.   Beginning January 23rd 2017 lab work for the [facility] will be done in the  Main lab at [facility] on 1st floor. If you have a lab appointment with the [facility] please come in thru the  Main Entrance and check in at the main information desk  You need to re-schedule your appointment should you arrive 10 or more minutes late.  We strive to give you quality time with our providers, and arriving late affects you and other patients whose appointments are after yours.  Also, if you no show three or more times for appointments you may be dismissed from the clinic at the providers discretion.     Again, thank you for choosing [facility].  Our hope is that these requests will decrease the amount of time that you wait before being seen by our physicians.       _____________________________________________________________  Should you have questions after your visit to [facility], please contact our office at [phone] between the hours of 8:30 a.m. and 4:30 p.m.  Voicemails left after 4:30 p.m. will not be returned until the following business day.  For prescription refill requests, have your pharmacy contact our office.         Resources For Cancer Patients and their Caregivers ? American Cancer Society: Can assist with transportation, wigs, general needs, runs Look Good Feel Better.        680-440-3116 ? Cancer Care: Provides  financial assistance, online support groups, medication/co-pay assistance.  1-800-813-HOPE 959-081-7480) ? [place] Assists [place] cancer patients and their families through emotional , educational and financial support.  2246776979 ? [place] DSS Where to apply for food stamps, Medicaid and utility assistance. 404-324-0061 ? RCATS: Transportation to medical appointments. (332)654-3112 ? Social Security Administration: May apply for disability if have a Stage IV cancer. 670-617-7733 [phone])791-0126 ? [employer], Disability and Transit Services: Assists with nutrition, care and transit needs. [place] Support Programs: @10RELATIVEDAYS @ > Cancer Support Group  2nd Tuesday of the month 1pm-2pm, Journey Room  > Creative Journey  3rd Tuesday of the month 1130am-1pm, Journey Room  > Look Good Feel Better  1st Wednesday of the month 10am-12 noon, Journey Room (Call [place] to register [phone])

## 2016-02-27 NOTE — Assessment & Plan Note (Addendum)
Stop smoking date was on 9/19. He continues to not smoke. Will continue to encourage. He has discontinued the nicotine patches.

## 2016-02-27 NOTE — Progress Notes (Signed)
[name] presents today for injection per MD orders. Flu vaccination administered IM in right Upper Arm. Administration without incident. Patient tolerated well.

## 2016-02-27 NOTE — Progress Notes (Signed)
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 He is seen at [facility], Hepatitis C negative CT neck with contrast 06/25/2015 at [place] level 2 malignant LAD, largest node is 3.5 cm long axis. R worse than L carotid atherosclerosis and stenosis. CT chest with no acute or significant findings within the chest    Cancer of tonsillar fossa ([place])   08/02/2015 Initial Diagnosis    Cancer of tonsillar fossa ([place])      08/20/2015 Procedure    Port-a-cath placed by IR      08/20/2015 Procedure    G-tube placed by IR      08/21/2015 - 10/09/2015 Radiation Therapy    70 Gy, Dr. Isidore Moos.      08/21/2015 - 09/10/2015 Chemotherapy    Cisplatin every 21 days with XRT.      08/28/2015 Adverse Reaction    Tinnitis, Cisplatin-induced.      09/11/2015 - 09/30/2015 Chemotherapy    Carboplatin/5FU      09/20/2015 - 09/21/2015 Hospital Admission    Intractable nausea/vomiting. Severe Mucositis      10/17/2015 - 10/20/2015 Hospital Admission    Esophageal/oral candidasis. Shingles. Fever. Chemotherapy induced pancytopenia      01/24/2016 PET scan    Resolution of the left tonsillar mass and left neck adenopathy. No findings for residual or locally recurrent tumor, adenopathy or distant metastatic disease.       HISTORY OF PRESENTING ILLNESS:  Jester Klingberg 65 y.o. male is here for further follow-up of Stage IVA T2N2bM0 squamous cell Ca of the L tonsil. He has completed therapy. Is making a recovery but has struggled with smoking cessation, depression, weight maintenance. Is doing better today.   Patient's weight has  been stable. He is still experiencing ringing and pain in right ear. He stopped smoking on September 19th and the ringing in his ears started September 22nd. He also feels his ear is blocked and has tried taking Claritin and Benadryl, but they have not alleviated his symptoms. Gerrald states that he only feels relief when he sleeps. He has an appointment with Dr. Benjamine Mola on Monday for hearing test.   He says his sweats from the pilocarpine have diminished. He is not sure it is helping his dry mouth and inquire about increasing the dose.  Mamoudou stopped nicotine patches because they caused rashes.    Patient denies pain from feeding tube, but states he was experiencing some itching from tube that has now subsided.   He needs a refill on Lexapro and Ativan.  Mood is better.    MEDICAL HISTORY:  Past Medical History:  Diagnosis Date  . Cirrhosis ([place])   . Hypertension   . Portal hypertension ([place])   . Tonsillar mass    left    SURGICAL HISTORY: Past Surgical History:  Procedure Laterality Date  . HERNIA REPAIR Right   . MOUTH SURGERY    . TONSILLECTOMY Left 07/09/2015   Procedure: LEFT TONSILLECTOMY;  Surgeon: Leta Baptist, MD;  Location: [facility];  Service: ENT;  Laterality: Left;    SOCIAL HISTORY: Social History   Social History  . Marital status: Single    Spouse name: N/A  .  Number of children: 0  . Years of education: N/A   Occupational History  . Not on file.   Social History Main Topics  . Smoking status: Current Every Day Smoker    Packs/day: 0.50    Types: Cigarettes  . Smokeless tobacco: Never Used  . Alcohol use No     Comment: former abuse- quit 09/2011  . Drug use: No  . Sexual activity: Not on file   Other Topics Concern  . Not on file   Social History Narrative   Divorced.   No children.   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   Currently smoking approximately one pack per day.   Has been smoking off/on since age 88.    Divorced No children. Just step-children. 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 Previously worked in [employer]. Originally from [place], [place]. Current smoker at 1 ppd. Began smoking at 51-17 yo, regularly smoking at 57-21 yo. During his "cocaine years" he smoked about 2 ppd. ETOH, recovering alcoholic. 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 He enjoys reading. 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  FAMILY HISTORY: Family History  Problem Relation Age of Onset  . Macular degeneration Mother   . Alcoholism Father    indicated that his mother is alive. He indicated that his father is deceased.   Mother still living at 50 years-old, with macular degeneration. She has no other health issues. Father deceased. Parents divorced at 50 years-old, so he is not sure what he died from. Father was an alcoholic. 4 sisters. 1 sister died of suicide. 1 sister was adopted. 1 Sister lives in [place]. 1 Sister lives in [place].  ALLERGIES:  is allergic to bee venom and other.  MEDICATIONS:  Current Outpatient Prescriptions  Medication Sig Dispense Refill  . cetirizine (ZYRTEC) 10 MG tablet Take 10 mg by mouth daily.    Marland Kitchen escitalopram (LEXAPRO) 20 MG tablet Take 1/2 tablet for 1 week then increase to 1 tablet daily. 30 tablet 3  . fentaNYL (DURAGESIC - DOSED MCG/HR) 12 MCG/HR Place 1 patch (12.5 mcg total) onto the skin every 3 (three) days. 10 patch 0  . LORazepam (ATIVAN) 0.5 MG tablet Take 1 tablet (0.5 mg total) by mouth every 6 (six) hours as needed for anxiety. 45 tablet 0  . Nutritional Supplements (JEVITY 1.2 CAL/FIBER) LIQD 8 cans/day.  4 feeding of 2 cans each. Flush with 100 mls before and after each feeding 1000 mL 6  . ondansetron (ZOFRAN ODT) 8 MG disintegrating tablet Take 1 tablet (8 mg total) by mouth every 8 (eight) hours as needed for nausea or vomiting. 30 tablet 2  . oxyCODONE (ROXICODONE) 5 MG/5ML solution Take 5-10 mLs (5-10 mg total) by mouth every 4 (four) hours as  needed for severe pain. 250 mL 0  . pilocarpine (SALAGEN) 5 MG tablet Take 2 tablets (10 mg total) by mouth 3 (three) times daily. 180 tablet 3  . sodium fluoride (FLUORISHIELD) 1.1 % GEL dental gel Instill one drop of gel per tooth space of fluoride tray. Place over teeth for 5 minutes. Remove. Spit out excess. Repeat nightly. 125 mL 2  . buPROPion (WELLBUTRIN XL) 150 MG 24 hr tablet Take once daily in the am, after 5 days increase to two tablets (Patient not taking: Reported on 02/27/2016) 60 tablet 3  . cholecalciferol (VITAMIN D) 1000 units tablet Take 1,000 Units by mouth daily.    . metoCLOPramide (REGLAN) 5 MG tablet Take 1 tablet (  5 mg total) by mouth 4 (four) times daily -  before meals and at bedtime. (Patient not taking: Reported on 02/27/2016) 240 tablet 1  . nicotine (NICODERM CQ - DOSED IN MG/24 HOURS) 21 mg/24hr patch Place 1 patch (21 mg total) onto the skin daily. (Patient not taking: Reported on 02/27/2016) 14 patch 0   No current facility-administered medications for this visit.    Facility-Administered Medications Ordered in Other Visits  Medication Dose Route Frequency Provider Last Rate Last Dose  . 0.9 %  sodium chloride infusion   Intravenous Once [employer], PA-C      . 0.9 %  sodium chloride infusion   Intravenous Continuous Manon Hilding Kefalas, PA-C      . 0.9 %  sodium chloride infusion   Intravenous Continuous Thomas S Kefalas, PA-C      . 0.9 %  sodium chloride infusion   Intravenous Continuous Shannon K Penland, MD      . 0.9 %  sodium chloride infusion   Intravenous Continuous Kelby Fam Penland, MD      . 0.9 %  sodium chloride infusion   Intravenous Continuous Patrici Ranks, MD        Review of Systems  Constitutional: Negative for fever, chills.  HENT: Positive for ears ringing, mouth pain, and sore throat. Negative for congestion, hearing loss and nosebleeds.  Dry mouth Ears ringing and feel blocked Eyes: Negative.  Negative for blurred vision, double  vision, pain and discharge.  Respiratory: Negative for cough, hemoptysis, shortness of breath and wheezing.   Cardiovascular: Negative.  Negative for chest pain, palpitations, claudication, leg swelling and PND.  Gastrointestinal: Negative for heartburn, vomiting, abdominal pain, blood in stool and melena.  Genitourinary: Negative.  Negative for dysuria, urgency, frequency and hematuria.  Musculoskeletal: Negative.  Negative for myalgias, joint pain and falls.  Skin: Positive for rash Rash from nicotine patches and shingles Neurological: Negative.  Negative for dizziness, tingling, tremors, sensory change, speech change, focal weakness, seizures, loss of consciousness, weakness and headaches.  Endo/Heme/Allergies: Negative.  Does not bruise/bleed easily.  Psychiatric/Behavioral: Positive for insomnia.  Negative for depression, suicidal ideas, memory loss and substance abuse. The patient is not nervous/anxious. All other systems reviewed and are negative. 14 point ROS was done and is otherwise as detailed above or in HPI   PHYSICAL EXAMINATION: ECOG PERFORMANCE STATUS: 1 - Symptomatic but completely ambulatory  There were no vitals filed for this visit.  Vitals with BMI 02/27/2016  Height   Weight 152 lbs 2 oz  BMI   Systolic 662  Diastolic 79  Pulse 81  Respirations 20    Physical Exam  Constitutional: He is oriented to person, place, and time  and in no distress.  HENT: cerumen impaction in right ear Head: Normocephalic and atraumatic.  Nose: Nose normal.  Mouth/Throat: No mucositis. Oropharynx clear. Tongue with normal ROM Eyes: Conjunctivae and EOM are normal. Pupils are equal, round, and reactive to light. Right eye exhibits no discharge. Left eye exhibits no discharge. No scleral icterus.  Neck: Normal range of motion. Neck supple. No tracheal deviation present. No thyromegaly present. Lymphedema noted.  Cardiovascular: Normal rate, regular rhythm and normal heart sounds.   Exam reveals no gallop and no friction rub.  Port site is C/D/I no erythema or bruising No murmur heard.  Pulmonary/Chest: Effort normal and breath sounds normal. He has no wheezes. He has no rales.  Abdominal: Soft. Bowel sounds are normal. He exhibits no distension and no  mass. There is no tenderness. There is no rebound and no guarding. FT site is C/D/I, no erythema Musculoskeletal: Normal range of motion. He exhibits no edema.  Neurological: He is alert and oriented to person, place, and time. He has normal reflexes. No cranial nerve deficit. Gait normal. Coordination normal.  Skin: Skin is warm and dry.  Psychiatric: Mood, memory, affect and judgment normal.  Nursing note and vitals reviewed.   LABORATORY DATA:  I have reviewed the data as listed below. Results for OBI, SCRIMA (MRN [id number]) as of 02/27/2016 18:30  Ref. Range 02/27/2016 10:04  Sodium Latest Ref Range: 135 - 145 mmol/L 134 (L)  Potassium Latest Ref Range: 3.5 - 5.1 mmol/L 4.0  Chloride Latest Ref Range: 101 - 111 mmol/L 99 (L)  CO2 Latest Ref Range: 22 - 32 mmol/L 29  BUN Latest Ref Range: 6 - 20 mg/dL 20  Creatinine Latest Ref Range: 0.61 - 1.24 mg/dL 0.88  Calcium Latest Ref Range: 8.9 - 10.3 mg/dL 9.2  EGFR (Non-African Amer.) Latest Ref Range: >60 mL/min >60  EGFR (African American) Latest Ref Range: >60 mL/min >60  Glucose Latest Ref Range: 65 - 99 mg/dL 104 (H)  Anion gap Latest Ref Range: 5 - 15  6  Alkaline Phosphatase Latest Ref Range: 38 - 126 U/L 51  Albumin Latest Ref Range: 3.5 - 5.0 g/dL 4.0  AST Latest Ref Range: 15 - 41 U/L 14 (L)  ALT Latest Ref Range: 17 - 63 U/L 12 (L)  Total Protein Latest Ref Range: 6.5 - 8.1 g/dL 6.7  Total Bilirubin Latest Ref Range: 0.3 - 1.2 mg/dL 0.4  WBC Latest Ref Range: 4.0 - 10.5 K/uL 6.7  RBC Latest Ref Range: 4.22 - 5.81 MIL/uL 3.99 (L)  Hemoglobin Latest Ref Range: 13.0 - 17.0 g/dL 12.0 (L)  HCT Latest Ref Range: 39.0 - 52.0 % 36.6 (L)  MCV Latest Ref  Range: 78.0 - 100.0 fL 91.7  MCH Latest Ref Range: 26.0 - 34.0 pg 30.1  MCHC Latest Ref Range: 30.0 - 36.0 g/dL 32.8  RDW Latest Ref Range: 11.5 - 15.5 % 13.5  Platelets Latest Ref Range: 150 - 400 K/uL 210  Neutrophils Latest Units: % 81  Lymphocytes Latest Units: % 6  Monocytes Relative Latest Units: % 11  Eosinophil Latest Units: % 1  Basophil Latest Units: % 1  NEUT# Latest Ref Range: 1.7 - 7.7 K/uL 5.4  Lymphocyte # Latest Ref Range: 0.7 - 4.0 K/uL 0.4 (L)  Monocyte # Latest Ref Range: 0.1 - 1.0 K/uL 0.7  Eosinophils Absolute Latest Ref Range: 0.0 - 0.7 K/uL 0.1  Basophils Absolute Latest Ref Range: 0.0 - 0.1 K/uL 0.1   RADIOGRAPHIC STUDIES: I have personally reviewed the radiological reports as listed.  Study Result   Study Result   CLINICAL DATA:  Subsequent treatment strategy for tonsillar carcinoma.  EXAM: NUCLEAR MEDICINE PET SKULL BASE TO THIGH  TECHNIQUE: Seven point for mCi F-18 FDG was injected intravenously. Full-ring PET imaging was performed from the skull base to thigh after the radiotracer. CT data was obtained and used for attenuation correction and anatomic localization.  FASTING BLOOD GLUCOSE:  Value: 103 mg/dl  COMPARISON:  PET-CT 07/30/2015 and prior neck CT 06/25/2015  FINDINGS: NECK  No areas of hypermetabolism identified in the left tonsillar area. The enlarged and hypermetabolic left-sided level 2 lymph node has resolved. No enlarged or hypermetabolic neck nodes. No supraclavicular adenopathy.  CHEST  No hypermetabolic mediastinal or hilar nodes. No suspicious pulmonary  nodules on the CT scan.  ABDOMEN/PELVIS  No abnormal hypermetabolic activity within the liver, pancreas, adrenal glands, or spleen. No hypermetabolic lymph nodes in the abdomen or pelvis.  A gastric feeding tube is noted. Stable atherosclerotic calcifications involving the aorta and branch vessels.  SKELETON  No focal hypermetabolic activity to  suggest skeletal metastasis.  IMPRESSION: Resolution of the left tonsillar mass and left neck adenopathy. No findings for residual or locally recurrent tumor, adenopathy or distant metastatic disease.   Electronically Signed   By: Marijo Sanes M.D.   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 He is seen at [facility], Hepatitis C negative CT neck with contrast 06/25/2015 at [place] level 2 malignant LAD, largest node is 3.5 cm long axis. R worse than L carotid atherosclerosis and stenosis. CT chest with no acute or significant findings within the chest [facility] stay secondary to intractable n/v and mucositis Oropharyngeal candidiasis/esophageal candidiasis Odynophagia Fever Shingles Chemotherapy induced pancytopenia   Tinnitus R ear tinnitus, has upcoming appointment with Dr. Benjamine Mola for further evaluation. I suspect it is secondary to cerumen impaction seen on PE today.   Tobacco use disorder Stop smoking date was on 9/19. He continues to not smoke. Will continue to encourage. He has discontinued the nicotine patches.   Cancer of tonsillar fossa ([facility]) Stage IVA squamous cell carcinoma of left tonsil (T2N2BM0) undergoing concomitant chemoradiation beginning on 08/20/2015 consisting of Cisplatin every 21 days complicated by Cisplatin-induced tinnitus following cycle 1 of treatment leading to a change in therapy to Carboplatin/5FU.  Treatment course complicated by severe chemoradiation-induced stomatitis.  Labs today: CBC diff, CMET.  I personally reviewed and went over laboratory results with the patient.  The results are noted within this dictation.  He is to continue to follow with speech pathology as directed.  He is educated on the importance of this.  Recent PET/CT was excellent. Patient continues to follow with  radiation oncology.   He needs a refill on Ativan and lexapro.  Both are printed today.   Reactive depression (situational) Mood is improved. He is to continue lexapro for now. History of alcoholism, no problems currently. We discuss this frequently  Shingles rash Resolved. Occasional itchiness, no significant post-herpetic neuralgia.   Xerostomia due to radiotherapy Patient is currently on pilocarpine, will increase slowly to 10 mg po tid. Will see if this helps improve his xerostomia at a higher dose. New prescription was provided to the patient today.    I will see Graciano back in the next 2-3 weeks. His recovery is better with ongoing close visits. He knows to call in the interim if there is anything he needs.  All questions were answered. The patient knows to call the clinic with any problems, questions or concerns.  This document serves as a record of services personally performed by Ancil Linsey, MD. It was created on her behalf by Elmyra Ricks, a trained medical scribe. The creation of this record is based on the scribe's personal observations and the provider's statements to them. This document has been checked and approved by the attending provider.  I have reviewed the above documentation for accuracy and completeness, and I agree with the above.  This note was electronically signed.    Molli Hazard, MD  02/29/2016 12:07 PM

## 2016-02-27 NOTE — Assessment & Plan Note (Addendum)
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  Treatment course complicated by severe chemoradiation-induced stomatitis.  Labs today: CBC diff, CMET.  I personally reviewed and went over laboratory results with the patient.  The results are noted within this dictation.  He is to continue to follow with speech pathology as directed.  He is educated on the importance of this.  Recent PET/CT was excellent. Patient continues to follow with radiation oncology.   He needs a refill on Ativan and lexapro.  Both are printed today.

## 2016-02-27 NOTE — Progress Notes (Signed)
Frank Castillo presented for Portacath access and flush. Portacath located right chest wall accessed with  H 20 needle. Good blood return present. Portacath flushed with 31ml NS and 500U/71ml Heparin and needle removed intact. Procedure without incident. Patient tolerated procedure well.

## 2016-02-27 NOTE — Assessment & Plan Note (Addendum)
R ear tinnitus, has upcoming appointment with Dr. Benjamine Mola for further evaluation. I suspect it is secondary to cerumen impaction seen on PE today.

## 2016-02-27 NOTE — Patient Instructions (Signed)
[facility] at [facility] Discharge Instructions  RECOMMENDATIONS MADE BY THE CONSULTANT AND ANY TEST RESULTS WILL BE SENT TO YOUR REFERRING PHYSICIAN.  Port flush done today. Follow up as scheduled.  Thank you for choosing [facility] at [facility] to provide your oncology and hematology care.  To afford each patient quality time with our provider, please arrive at least 15 minutes before your scheduled appointment time.   Beginning January 23rd 2017 lab work for the Cancer Center will be done in the  Main lab at [facility] on 1st floor. If you have a lab appointment with the Cancer Center please come in thru the  Main Entrance and check in at the main information desk  You need to re-schedule your appointment should you arrive 10 or more minutes late.  We strive to give you quality time with our providers, and arriving late affects you and other patients whose appointments are after yours.  Also, if you no show three or more times for appointments you may be dismissed from the clinic at the providers discretion.     Again, thank you for choosing [facility].  Our hope is that these requests will decrease the amount of time that you wait before being seen by our physicians.       _____________________________________________________________  Should you have questions after your visit to [facility], please contact our office at [phone] between the hours of 8:30 a.m. and 4:30 p.m.  Voicemails left after 4:30 p.m. will not be returned until the following business day.  For prescription refill requests, have your pharmacy contact our office.         Resources For Cancer Patients and their Caregivers ? American Cancer Society: Can assist with transportation, wigs, general needs, runs Look Good Feel Better.        1-888-227-6333 ? Cancer Care: Provides financial assistance, online support groups,  medication/co-pay assistance.  1-800-813-HOPE (4673) ? [facility] Assists [place] cancer patients and their families through emotional , educational and financial support.  336-427-4357 ? [place] DSS Where to apply for food stamps, Medicaid and utility assistance. 336-342-1394 ? [facility]: Transportation to medical appointments. 336-347-2287 ? Social Security Administration: May apply for disability if have a Stage IV cancer. 336-342-7796 1-800-772-1213 ? [place] Aging, Disability and Transit Services: Assists with nutrition, care and transit needs. 336-349-2343  Cancer Center Support Programs: @10RELATIVEDAYS@ > Cancer Support Group  2nd Tuesday of the month 1pm-2pm, Journey Room  > Creative Journey  3rd Tuesday of the month 1130am-1pm, Journey Room  > Look Good Feel Better  1st Wednesday of the month 10am-12 noon, Journey Room (Call American Cancer Society to register 1-800-395-5775)   

## 2016-02-28 ENCOUNTER — Encounter (HOSPITAL_COMMUNITY): Payer: Self-pay | Admitting: Emergency Medicine

## 2016-02-28 NOTE — Progress Notes (Signed)
Compression garment (face Mask) faxed to a special place [phone]

## 2016-02-29 DIAGNOSIS — F329 Major depressive disorder, single episode, unspecified: Secondary | ICD-10-CM | POA: Insufficient documentation

## 2016-02-29 DIAGNOSIS — Y842 Radiological procedure and radiotherapy as the cause of abnormal reaction of the patient, or of later complication, without mention of misadventure at the time of the procedure: Secondary | ICD-10-CM | POA: Insufficient documentation

## 2016-02-29 DIAGNOSIS — K117 Disturbances of salivary secretion: Secondary | ICD-10-CM | POA: Insufficient documentation

## 2016-02-29 NOTE — Assessment & Plan Note (Signed)
Resolved. Occasional itchiness, no significant post-herpetic neuralgia.

## 2016-02-29 NOTE — Assessment & Plan Note (Signed)
Mood is improved. He is to continue lexapro for now. History of alcoholism, no problems currently. We discuss this frequently

## 2016-02-29 NOTE — Assessment & Plan Note (Signed)
Patient is currently on pilocarpine, will increase slowly to 10 mg po tid. Will see if this helps improve his xerostomia at a higher dose. New prescription was provided to the patient today.

## 2016-03-02 ENCOUNTER — Ambulatory Visit (INDEPENDENT_AMBULATORY_CARE_PROVIDER_SITE_OTHER): Payer: BLUE CROSS/BLUE SHIELD | Admitting: Otolaryngology

## 2016-03-02 DIAGNOSIS — H9313 Tinnitus, bilateral: Secondary | ICD-10-CM | POA: Diagnosis not present

## 2016-03-02 DIAGNOSIS — H6123 Impacted cerumen, bilateral: Secondary | ICD-10-CM | POA: Diagnosis not present

## 2016-03-02 DIAGNOSIS — H903 Sensorineural hearing loss, bilateral: Secondary | ICD-10-CM | POA: Diagnosis not present

## 2016-03-04 ENCOUNTER — Telehealth (HOSPITAL_COMMUNITY): Payer: Self-pay | Admitting: Emergency Medicine

## 2016-03-04 ENCOUNTER — Other Ambulatory Visit (HOSPITAL_COMMUNITY): Payer: Self-pay | Admitting: Emergency Medicine

## 2016-03-04 DIAGNOSIS — C090 Malignant neoplasm of tonsillar fossa: Secondary | ICD-10-CM

## 2016-03-04 MED ORDER — FENTANYL 12 MCG/HR TD PT72: 12.5000 ug | MEDICATED_PATCH | TRANSDERMAL | 0 refills | Status: DC

## 2016-03-04 MED ORDER — OXYCODONE HCL 5 MG/5ML PO SOLN: 5.0000 mg | ORAL | 0 refills | Status: DC | PRN

## 2016-03-04 NOTE — Telephone Encounter (Signed)
Pt tried taking 2 tablets of salagen at night but got sick (throwing up and had diarrhea).  Pt will try to take the dosage again tonight, if he cant he will go back to 1 tablet 3 times a day until he sees the doctor again.  Refilled oxycodone and fentanyl patch.

## 2016-03-16 ENCOUNTER — Other Ambulatory Visit (HOSPITAL_COMMUNITY): Payer: Self-pay | Admitting: Emergency Medicine

## 2016-03-16 ENCOUNTER — Telehealth (HOSPITAL_COMMUNITY): Payer: Self-pay | Admitting: Emergency Medicine

## 2016-03-16 DIAGNOSIS — C090 Malignant neoplasm of tonsillar fossa: Secondary | ICD-10-CM

## 2016-03-16 MED ORDER — OXYCODONE HCL 5 MG/5ML PO SOLN: 5.0000 mg | ORAL | 0 refills | Status: DC | PRN

## 2016-03-16 NOTE — Telephone Encounter (Signed)
Pt called wanted refill on oxycodone, refilled and ready at the desk today.  Pt verbalized understanding.

## 2016-03-17 ENCOUNTER — Encounter (HOSPITAL_COMMUNITY): Payer: Self-pay | Admitting: Speech Pathology

## 2016-03-17 ENCOUNTER — Ambulatory Visit (HOSPITAL_COMMUNITY): Payer: BLUE CROSS/BLUE SHIELD | Attending: Hematology & Oncology | Admitting: Speech Pathology

## 2016-03-17 DIAGNOSIS — R1312 Dysphagia, oropharyngeal phase: Secondary | ICD-10-CM

## 2016-03-17 NOTE — Therapy (Signed)
[place], [place], [address] Phone: [phone]   Fax:  [phone]  Speech Language Pathology Treatment  Patient Details  Name: Frank Castillo MRN: [id number] Date of Birth: [birth year] No Data Recorded  Encounter Date: 03/17/2016      End of Session - 03/17/16 1905    Visit Number 7   Number of Visits 7   Authorization Type BCBS   Authorization Time Period through 03/26/2016   SLP Start Time 1405   SLP Stop Time  1456   SLP Time Calculation (min) 51 min   Activity Tolerance Patient tolerated treatment well      Past Medical History:  Diagnosis Date  . Cirrhosis ([place])   . Hypertension   . Portal hypertension ([place])   . Tonsillar mass    left    Past Surgical History:  Procedure Laterality Date  . HERNIA REPAIR Right   . MOUTH SURGERY    . TONSILLECTOMY Left 07/09/2015   Procedure: LEFT TONSILLECTOMY;  Surgeon: Leta Baptist, MD;  Location: [facility];  Service: ENT;  Laterality: Left;    There were no vitals filed for this visit.      Subjective Assessment - 03/17/16 1903    Subjective "I have been using the tongue blades."   Currently in Pain? No/denies  however pt reports sore throat at times           ADULT SLP TREATMENT - 03/17/16 1904      General Information   Behavior/Cognition Alert;Cooperative;Pleasant mood   Patient Positioning Upright in chair   Oral care provided N/A   HPI Frank Castillo 65 y.o. male referred by Dr. 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 Pt with mucositis oral and oral candidiasis and treated with nystatin (MYCOSTATIN) 100000 UNIT/ML suspension. Pt will continue with IV fluids three times weekly. Left tonsillectomy 07/09/2015 and  PEG placed 08/20/2015. Pt anticipates that chemo will conclude ~10/22/2015 and radiation ~10/09/2015. Pt referred for swallowing evaluation due to risk of aspiration and malnutrition in setting of head and neck cancer treatment.      Treatment Provided   Treatment provided Dysphagia     Dysphagia Treatment   Temperature Spikes Noted No   Respiratory Status Room air   Oral Cavity - Dentition Adequate natural dentition   Treatment Methods Skilled observation;Therapeutic exercise;Compensation strategy training;Patient/caregiver education   Patient observed directly with PO's Yes   Type of PO's observed Thin liquids   Feeding Able to feed self   Liquids provided via Cup   Amount of cueing Independent     Pain Assessment   Pain Assessment No/denies pain     Assessment / Recommendations / Plan   Plan Continue with current plan of care          SLP Education - 03/17/16 1904    Education provided Yes   Education Details education regarding trismus device use   Person(s) Educated Patient   Methods Explanation;Demonstration   Comprehension Verbalized understanding          SLP Short Term Goals - 03/17/16 1906      SLP SHORT TERM GOAL #1   Title Pt will complete swallowing exercises 3x/day 7 days a week with use of written cues and pt/caregiver report.    Baseline not completing   Time  2   Period Months   Status On-going     SLP SHORT TERM GOAL #2   Title Pt will increase oral intake of all consistencies and textures to regular/thin for 3 small meals a day to decrease dependency of PEG and facilitate safe swallow function going forward.    Baseline sipping liquids only   Time 2   Period Months   Status On-going          SLP Long Term Goals - 03/17/16 1906      SLP LONG TERM GOAL #1   Title Pt will demonstrate safe and efficient consumption of self regulated regular textures with use of compensatory strategies as needed.   Baseline Pt only drinking liquids at this time  due to odynophagia   Time 2   Period Months   Status On-going          Plan - 03/17/16 1905    Clinical Impression Statement Frank Castillo was unaccompanied to today's appointment. He reports that he still has a sore throat, which requires pain medication. He continues to complete exercises (ROM and strengthening oropharyngeal musculature). SLP provided a small ball to use for chin tuck against resistance exercises at home. Maximum jaw opening measured this date (pt has been using stacked tongue depressors given to him by Dr. Enrique Sack) and measurement is 104mm. He has been using 27 stacked tongue depressors and he reports decreased ROM when he doesn't use them for a few days. SLP made pt a passive and active ROM device and pt instructed to use at home going forward to manage effects of radiation induced fibrosis (trismus). SLP also instructed pt on how to self measure. Pt would benefit from 1-2 more treatment sessions over the next 2 months to ensure carryover of device and adjust it as needed. Will request visits. Pt continues to use PEG for nutrition, but also is eating by mouth.    Speech Therapy Frequency Monthly   Duration --  1 more visit in 4 weeks   Treatment/Interventions Aspiration precaution training;Pharyngeal strengthening exercises;Diet toleration management by SLP;SLP instruction and feedback;Compensatory strategies;Patient/family education   Potential to Achieve Goals Good   SLP Home Exercise Plan Pt will be independent with HEP as assigned to facilitate carryover of treatment strategies at home   Consulted and Agree with Plan of Care Patient      Patient will benefit from skilled therapeutic intervention in order to improve the following deficits and impairments:   Dysphagia, oropharyngeal phase    Problem List Patient Active Problem List   Diagnosis Date Noted  . Reactive depression (situational) 02/29/2016  . 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 Tobacco use  disorder 02/27/2016  . Tinnitus 02/27/2016  . Dysuria 10/25/2015  . Decreased oral intake 10/25/2015  . Malnutrition of moderate degree 10/19/2015  . Antineoplastic chemotherapy induced pancytopenia ([place]) 10/18/2015  . Fever   . Candidiasis of mouth and esophagus ([place]) 10/17/2015  . Neutropenia, febrile ([place]) 10/17/2015  . Shingles rash 10/17/2015  . Nausea with vomiting 09/20/2015  . Hyponatremia 09/20/2015  . Oral pharyngeal candidiasis 09/20/2015  . Antineoplastic chemotherapy induced anemia 09/20/2015  . Hypertension   . Cirrhosis ([place])   . Portal hypertension ([place])   . Cancer of tonsillar fossa [facility]) 08/02/2015   Thank you,  Genene Churn, [place]  [facility] 03/17/2016, 7:07 PM  [place] [address] [place], [place], [address] Phone: [phone]   Fax:  [phone]   Name: Frank  Castillo MRN: [id number] Date of Birth: 1950-12-31

## 2016-03-24 ENCOUNTER — Encounter (HOSPITAL_BASED_OUTPATIENT_CLINIC_OR_DEPARTMENT_OTHER): Payer: BLUE CROSS/BLUE SHIELD | Admitting: Hematology & Oncology

## 2016-03-24 ENCOUNTER — Encounter: Payer: Self-pay | Admitting: Dietician

## 2016-03-24 ENCOUNTER — Encounter (HOSPITAL_COMMUNITY): Payer: Self-pay | Admitting: Hematology & Oncology

## 2016-03-24 DIAGNOSIS — K117 Disturbances of salivary secretion: Secondary | ICD-10-CM

## 2016-03-24 DIAGNOSIS — B029 Zoster without complications: Secondary | ICD-10-CM

## 2016-03-24 DIAGNOSIS — F17200 Nicotine dependence, unspecified, uncomplicated: Secondary | ICD-10-CM

## 2016-03-24 DIAGNOSIS — Z720 Tobacco use: Secondary | ICD-10-CM

## 2016-03-24 DIAGNOSIS — C090 Malignant neoplasm of tonsillar fossa: Secondary | ICD-10-CM | POA: Diagnosis not present

## 2016-03-24 DIAGNOSIS — Y842 Radiological procedure and radiotherapy as the cause of abnormal reaction of the patient, or of later complication, without mention of misadventure at the time of the procedure: Secondary | ICD-10-CM

## 2016-03-24 MED ORDER — OXYCODONE HCL 5 MG/5ML PO SOLN: 5.0000 mg | ORAL | 0 refills | Status: DC | PRN

## 2016-03-24 MED ORDER — LORAZEPAM 0.5 MG PO TABS: 0.5000 mg | ORAL_TABLET | Freq: Four times a day (QID) | ORAL | 0 refills | Status: DC | PRN

## 2016-03-24 NOTE — Assessment & Plan Note (Signed)
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  Treatment course complicated by severe chemoradiation-induced stomatitis.  Labs today: CBC diff, CMET.  I personally reviewed and went over laboratory results with the patient.  The results are noted within this dictation.  He is to continue to follow with speech pathology as directed.  He is educated on the importance of this.  Recent PET/CT was excellent. Patient continues to follow with radiation oncology.   He needs a refill on Ativan and lexapro.  Both are printed today.

## 2016-03-24 NOTE — Assessment & Plan Note (Signed)
Resolved. Occasional itchiness, no significant post-herpetic neuralgia.

## 2016-03-24 NOTE — Assessment & Plan Note (Signed)
Patient is currently on pilocarpine, will increase slowly to 10 mg po tid. Will see if this helps improve his xerostomia at a higher dose. New prescription was provided to the patient today.

## 2016-03-24 NOTE — Assessment & Plan Note (Signed)
Stop smoking date was on 9/19. He continues to not smoke. Will continue to encourage. He has discontinued the nicotine patches.

## 2016-03-24 NOTE — Progress Notes (Signed)
[facility] NOTE  Patient Care Team: Celedonio Savage, MD as PCP - General (Family Medicine) Eppie Gibson, MD as Attending Physician (Radiation Oncology) Patrici Ranks, MD as Consulting Physician (Hematology and Oncology)  CHIEF COMPLAINTS:  Invasive squamous cell carcinoma of the Left tonsil, moderately differentiated, p16 positive Stage IVA T2N2bM0 Cirrhosis, portal hypertension. He is seen at [facility], Hepatitis C negative CT neck with contrast 06/25/2015 at [place] level 2 malignant LAD, largest node is 3.5 cm long axis. R worse than L carotid atherosclerosis and stenosis. CT chest with no acute or significant findings within the chest    Cancer of tonsillar fossa ([place])   08/02/2015 Initial Diagnosis    Cancer of tonsillar fossa ([place])      08/20/2015 Procedure    Port-a-cath placed by IR      08/20/2015 Procedure    G-tube placed by IR      08/21/2015 - 10/09/2015 Radiation Therapy    70 Gy, Dr. Isidore Moos.      08/21/2015 - 09/10/2015 Chemotherapy    Cisplatin every 21 days with XRT.      08/28/2015 Adverse Reaction    Tinnitis, Cisplatin-induced.      09/11/2015 - 09/30/2015 Chemotherapy    Carboplatin/5FU      09/20/2015 - 09/21/2015 Hospital Admission    Intractable nausea/vomiting. Severe Mucositis      10/17/2015 - 10/20/2015 Hospital Admission    Esophageal/oral candidasis. Shingles. Fever. Chemotherapy induced pancytopenia      01/24/2016 PET scan    Resolution of the left tonsillar mass and left neck adenopathy. No findings for residual or locally recurrent tumor, adenopathy or distant metastatic disease.       HISTORY OF PRESENTING ILLNESS:  Frank Castillo 65 y.o. male is here for further follow-up of Stage IVA T2N2bM0 squamous cell Ca of the L tonsil.  Patient has been craving various foods recently i.e. Berniece Salines and eggs/stew. However, he has lost one pound since his last visit.   Patient experiences a sore throat  after he eats. He notes that this at times makes him hesitant to eat.   Roshad continues to drink Ensure and eating every 2 hours.   He flushes his feeding tube twice a day.   Patient will be out of town from 11/8-11/12. He would like to have refill on ativan and oxycodone before he leaves.  Kshawn says he sweats a lot on [place].He notices that his mouth is not as dry by taking it so he does not want to stop taking it.   Patient started smoking again. He stopped for two weeks. He is smoking more than he did before. He notes that this has been extremely difficult for him.   MEDICAL HISTORY:  Past Medical History:  Diagnosis Date  . Cirrhosis ([place])   . Hypertension   . Portal hypertension ([place])   . Tonsillar mass    left    SURGICAL HISTORY: Past Surgical History:  Procedure Laterality Date  . HERNIA REPAIR Right   . MOUTH SURGERY    . TONSILLECTOMY Left 07/09/2015   Procedure: LEFT TONSILLECTOMY;  Surgeon: Leta Baptist, MD;  Location: [facility];  Service: ENT;  Laterality: Left;    SOCIAL HISTORY: Social History   Social History  . Marital status: Single    Spouse name: N/A  . Number of children: 0  . Years of education: N/A   Occupational History  . Not on file.  Social History Main Topics  . Smoking status: Current Every Day Smoker    Packs/day: 0.50    Types: Cigarettes  . Smokeless tobacco: Never Used  . Alcohol use No     Comment: former abuse- quit 09/2011  . Drug use: No  . Sexual activity: Not on file   Other Topics Concern  . Not on file   Social History Narrative   Divorced.   No children.   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   Currently smoking approximately one pack per day.   Has been smoking off/on since age 18.   Divorced No children. Just step-children. 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 Originally from [place], [place]. Current smoker at 1 ppd.  Began smoking at 14-15 yo, regularly smoking at [age] yo. During his "cocaine years" he smoked about 2 ppd. ETOH, recovering alcoholic. 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 He enjoys reading. 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  FAMILY HISTORY: Family History  Problem Relation Age of Onset  . Macular degeneration Mother   . Alcoholism Father    indicated that his mother is alive. He indicated that his father is deceased.   Mother still living at 86 years-old, with macular degeneration. She has no other health issues. Father deceased. Parents divorced at 10 years-old, so he is not sure what he died from. Father was an alcoholic. 4 sisters. 1 sister died of suicide. 1 sister was adopted. 1 Sister lives in [place]. 1 Sister lives in [place].  ALLERGIES:  is allergic to bee venom and other.  MEDICATIONS:  Current Outpatient Prescriptions  Medication Sig Dispense Refill  . cetirizine (ZYRTEC) 10 MG tablet Take 10 mg by mouth daily.    . cholecalciferol (VITAMIN D) 1000 units tablet Take 1,000 Units by mouth daily.    . escitalopram (LEXAPRO) 20 MG tablet Take 1/2 tablet for 1 week then increase to 1 tablet daily. 30 tablet 3  . fentaNYL (DURAGESIC - DOSED MCG/HR) 12 MCG/HR Place 1 patch (12.5 mcg total) onto the skin every 3 (three) days. 10 patch 0  . LORazepam (ATIVAN) 0.5 MG tablet Take 1 tablet (0.5 mg total) by mouth every 6 (six) hours as needed for anxiety. 45 tablet 0  . metoCLOPramide (REGLAN) 5 MG tablet Take 1 tablet (5 mg total) by mouth 4 (four) times daily -  before meals and at bedtime. (Patient not taking: Reported on 02/27/2016) 240 tablet 1  . nicotine (NICODERM CQ - DOSED IN MG/24 HOURS) 21 mg/24hr patch Place 1 patch (21 mg total) onto the skin daily. (Patient not taking: Reported on 02/27/2016) 14 patch 0  . Nutritional Supplements (JEVITY 1.2 CAL/FIBER) LIQD 8 cans/day.  4 feeding of 2 cans each. Flush with 100 mls before and after each feeding 1000 mL 6  . ondansetron (ZOFRAN ODT) 8  MG disintegrating tablet Take 1 tablet (8 mg total) by mouth every 8 (eight) hours as needed for nausea or vomiting. 30 tablet 2  . oxyCODONE (ROXICODONE) 5 MG/5ML solution Take 5-10 mLs (5-10 mg total) by mouth every 4 (four) hours as needed for severe pain. 250 mL 0  . pilocarpine (SALAGEN) 5 MG tablet Take 2 tablets (10 mg total) by mouth 3 (three) times daily. 180 tablet 3  . sodium fluoride (FLUORISHIELD) 1.1 % GEL dental gel Instill one drop of gel per tooth space of fluoride tray. Place over teeth for 5 minutes. Remove. Spit out excess. Repeat   nightly. 125 mL 2   No current facility-administered medications for this visit.    Facility-Administered Medications Ordered in Other Visits  Medication Dose Route Frequency Provider Last Rate Last Dose  . 0.9 %  sodium chloride infusion   Intravenous Once [employer], PA-C      . 0.9 %  sodium chloride infusion   Intravenous Continuous Manon Hilding Kefalas, PA-C      . 0.9 %  sodium chloride infusion   Intravenous Continuous Thomas S Kefalas, PA-C      . 0.9 %  sodium chloride infusion   Intravenous Continuous Nyleah Mcginnis K Krizia Flight, MD      . 0.9 %  sodium chloride infusion   Intravenous Continuous Kelby Fam Bryler Dibble, MD      . 0.9 %  sodium chloride infusion   Intravenous Continuous Patrici Ranks, MD        Review of Systems  Constitutional: Negative for fever, chills.  HENT: Positive for sore throat. Negative for congestion, hearing loss and nosebleeds.  Dry mouth Eyes: Negative.  Negative for blurred vision, double vision, pain and discharge.  Respiratory: Negative for cough, hemoptysis, shortness of breath and wheezing.   Cardiovascular: Negative.  Negative for chest pain, palpitations, claudication, leg swelling and PND.  Gastrointestinal: Negative for heartburn, vomiting, abdominal pain, blood in stool and melena.  Genitourinary: Negative.  Negative for dysuria, urgency, frequency and hematuria.  Musculoskeletal: Negative.  Negative for  myalgias, joint pain and falls.  Skin: Negative Neurological: Negative.  Negative for dizziness, tingling, tremors, sensory change, speech change, focal weakness, seizures, loss of consciousness, weakness and headaches.  Endo/Heme/Allergies: Negative.  Does not bruise/bleed easily.  Psychiatric/Behavioral: Positive for insomnia.  Negative for depression, suicidal ideas, memory loss and substance abuse. The patient is not nervous/anxious. All other systems reviewed and are negative. 14 point ROS was done and is otherwise as detailed above or in HPI   PHYSICAL EXAMINATION: ECOG PERFORMANCE STATUS: 1 - Symptomatic but completely ambulatory   Vitals with BMI 02/27/2016  Height   Weight 152 lbs 2 oz  BMI   Systolic 450  Diastolic 79  Pulse 81  Respirations 20    Physical Exam  Constitutional: He is oriented to person, place, and time  and in no distress.  HENT: cerumen impaction in right ear Head: Normocephalic and atraumatic.  Nose: Nose normal.  Mouth/Throat: No mucositis. Oropharynx clear. Tongue with normal ROM Eyes: Conjunctivae and EOM are normal. Pupils are equal, round, and reactive to light. Right eye exhibits no discharge. Left eye exhibits no discharge. No scleral icterus.  Neck: Normal range of motion. Neck supple. No tracheal deviation present. No thyromegaly present. Lymphedema noted.  Cardiovascular: Normal rate, regular rhythm and normal heart sounds.  Exam reveals no gallop and no friction rub.  Port site is C/D/I no erythema or bruising No murmur heard.  Pulmonary/Chest: Effort normal and breath sounds normal. He has no wheezes. He has no rales.  Abdominal: Soft. Bowel sounds are normal. He exhibits no distension and no mass. There is no tenderness. There is no rebound and no guarding. FT site is C/D/I, no erythema Musculoskeletal: Normal range of motion. He exhibits no edema.  Neurological: He is alert and oriented to person, place, and time. He has normal reflexes. No  cranial nerve deficit. Gait normal. Coordination normal.  Skin: Skin is warm and dry.  Psychiatric: Mood, memory, affect and judgment normal.  Nursing note and vitals reviewed.   LABORATORY DATA:  I have  reviewed the data as listed below. Results for JAHAAN, VANWAGNER (MRN [id number]) as of 02/27/2016 18:30  Ref. Range 02/27/2016 10:04  Sodium Latest Ref Range: 135 - 145 mmol/L 134 (L)  Potassium Latest Ref Range: 3.5 - 5.1 mmol/L 4.0  Chloride Latest Ref Range: 101 - 111 mmol/L 99 (L)  CO2 Latest Ref Range: 22 - 32 mmol/L 29  BUN Latest Ref Range: 6 - 20 mg/dL 20  Creatinine Latest Ref Range: 0.61 - 1.24 mg/dL 0.88  Calcium Latest Ref Range: 8.9 - 10.3 mg/dL 9.2  EGFR (Non-African Amer.) Latest Ref Range: >60 mL/min >60  EGFR (African American) Latest Ref Range: >60 mL/min >60  Glucose Latest Ref Range: 65 - 99 mg/dL 104 (H)  Anion gap Latest Ref Range: 5 - 15  6  Alkaline Phosphatase Latest Ref Range: 38 - 126 U/L 51  Albumin Latest Ref Range: 3.5 - 5.0 g/dL 4.0  AST Latest Ref Range: 15 - 41 U/L 14 (L)  ALT Latest Ref Range: 17 - 63 U/L 12 (L)  Total Protein Latest Ref Range: 6.5 - 8.1 g/dL 6.7  Total Bilirubin Latest Ref Range: 0.3 - 1.2 mg/dL 0.4  WBC Latest Ref Range: 4.0 - 10.5 K/uL 6.7  RBC Latest Ref Range: 4.22 - 5.81 MIL/uL 3.99 (L)  Hemoglobin Latest Ref Range: 13.0 - 17.0 g/dL 12.0 (L)  HCT Latest Ref Range: 39.0 - 52.0 % 36.6 (L)  MCV Latest Ref Range: 78.0 - 100.0 fL 91.7  MCH Latest Ref Range: 26.0 - 34.0 pg 30.1  MCHC Latest Ref Range: 30.0 - 36.0 g/dL 32.8  RDW Latest Ref Range: 11.5 - 15.5 % 13.5  Platelets Latest Ref Range: 150 - 400 K/uL 210  Neutrophils Latest Units: % 81  Lymphocytes Latest Units: % 6  Monocytes Relative Latest Units: % 11  Eosinophil Latest Units: % 1  Basophil Latest Units: % 1  NEUT# Latest Ref Range: 1.7 - 7.7 K/uL 5.4  Lymphocyte # Latest Ref Range: 0.7 - 4.0 K/uL 0.4 (L)  Monocyte # Latest Ref Range: 0.1 - 1.0 K/uL 0.7  Eosinophils  Absolute Latest Ref Range: 0.0 - 0.7 K/uL 0.1  Basophils Absolute Latest Ref Range: 0.0 - 0.1 K/uL 0.1   RADIOGRAPHIC STUDIES: I have personally reviewed the radiological reports as listed.  Study Result   CLINICAL DATA:  Subsequent treatment strategy for tonsillar carcinoma.  EXAM: NUCLEAR MEDICINE PET SKULL BASE TO THIGH  TECHNIQUE: Seven point for mCi F-18 FDG was injected intravenously. Full-ring PET imaging was performed from the skull base to thigh after the radiotracer. CT data was obtained and used for attenuation correction and anatomic localization.  FASTING BLOOD GLUCOSE:  Value: 103 mg/dl  COMPARISON:  PET-CT 07/30/2015 and prior neck CT 06/25/2015  FINDINGS: NECK  No areas of hypermetabolism identified in the left tonsillar area. The enlarged and hypermetabolic left-sided level 2 lymph node has resolved. No enlarged or hypermetabolic neck nodes. No supraclavicular adenopathy.  CHEST  No hypermetabolic mediastinal or hilar nodes. No suspicious pulmonary nodules on the CT scan.  ABDOMEN/PELVIS  No abnormal hypermetabolic activity within the liver, pancreas, adrenal glands, or spleen. No hypermetabolic lymph nodes in the abdomen or pelvis.  A gastric feeding tube is noted. Stable atherosclerotic calcifications involving the aorta and branch vessels.  SKELETON  No focal hypermetabolic activity to suggest skeletal metastasis.  IMPRESSION: Resolution of the left tonsillar mass and left neck adenopathy. No findings for residual or locally recurrent tumor, adenopathy or distant metastatic disease.  Electronically Signed   By: P.  Gallerani M.D.   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 He is seen at [facility], Hepatitis C negative CT neck with contrast 06/25/2015 at [facility] level 2  malignant LAD, largest node is 3.5 cm long axis. R worse than L carotid atherosclerosis and stenosis. 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  Concomitant chemoradiation began on 08/20/2015 consisting of Cisplatin every 21 days complicated by Cisplatin-induced tinnitus following cycle 1 of treatment leading to a change in therapy to Carboplatin/5FU.  Treatment course complicated by severe chemoradiation-induced stomatitis.  Post treatment PET/CT was excellent. Patient continues to follow with radiation oncology.   He continues to make a good recovery, I have continued to encourage him. Mood is much improved.   Requested refills: Meds ordered this encounter  Medications  . DISCONTD: oxyCODONE (ROXICODONE) 5 MG/5ML solution    Sig: Take 5-10 mLs (5-10 mg total) by mouth every 4 (four) hours as needed for severe pain.    Dispense:  250 mL    Refill:  0  . LORazepam (ATIVAN) 0.5 MG tablet    Sig: Take 1 tablet (0.5 mg total) by mouth every 6 (six) hours as needed for anxiety.    Dispense:  45 tablet    Refill:  0    Shingles rash Resolved. Occasional itchiness, no significant post-herpetic neuralgia.   Xerostomia due to radiotherapy Patient is currently on pilocarpine, he would like to continue.  Tobacco use disorder Stop smoking date was on 9/19. He continues to struggle with this. Will work on it moving forward.   I will see Wilmar back in the next 2-3 weeks. His recovery is better with ongoing close visits. He knows to call in the interim if there is anything he needs.  All questions were answered. The patient knows to call the clinic with any  problems, questions or concerns.  This document serves as a record of services personally performed by  , MD. It was created on her behalf by Dana Deeb, a trained medical scribe. The creation of this record is based on the scribe's personal observations and the provider's statements to them. This document has been checked and approved by the attending provider.  I have reviewed the above documentation for accuracy and completeness, and I agree with the above.  This note was electronically signed.    , Kristen, MD  03/24/2016 10:00 AM 

## 2016-03-24 NOTE — Patient Instructions (Addendum)
[place] at [facility] Discharge Instructions  RECOMMENDATIONS MADE BY THE CONSULTANT AND ANY TEST RESULTS WILL BE SENT TO YOUR REFERRING PHYSICIAN.  You saw Dr.Penland today. See Amy at checkout for appointments.  Return to clinic in 3 weeks  Refer to [facility] for survivorship/smoking cessation.  Thank you for choosing [place] at [facility] to provide your oncology and hematology care.  To afford each patient quality time with our provider, please arrive at least 15 minutes before your scheduled appointment time.   Beginning January 23rd 2017 lab work for the [facility] will be done in the  Main lab at [facility] on 1st floor. If you have a lab appointment with the [place] please come in thru the  Main Entrance and check in at the main information desk  You need to re-schedule your appointment should you arrive 10 or more minutes late.  We strive to give you quality time with our providers, and arriving late affects you and other patients whose appointments are after yours.  Also, if you no show three or more times for appointments you may be dismissed from the clinic at the providers discretion.     Again, thank you for choosing [facility].  Our hope is that these requests will decrease the amount of time that you wait before being seen by our physicians.       _____________________________________________________________  Should you have questions after your visit to [facility], please contact our office at [phone] between the hours of 8:30 a.m. and 4:30 p.m.  Voicemails left after 4:30 p.m. will not be returned until the following business day.  For prescription refill requests, have your pharmacy contact our office.         Resources For Cancer Patients and their Caregivers ? American Cancer Society: Can assist with transportation, wigs, general needs, runs Look Good Feel Better.         (240)526-3500 ? Cancer Care: Provides financial assistance, online support groups, medication/co-pay assistance.  1-800-813-HOPE (250)140-1738) ? [place] Assists [place] cancer patients and their families through emotional , educational and financial support.  918-212-5467 ? [place] DSS Where to apply for food stamps, Medicaid and utility assistance. (458) 048-2032 ? [facility]: Transportation to medical appointments. 954-415-3310 ? Social Security Administration: May apply for disability if have a Stage IV cancer. 949-030-6095 (417)321-8160 ? [employer], Disability and Transit Services: Assists with nutrition, care and transit needs. [place] Support Programs: @10RELATIVEDAYS @ > Cancer Support Group  2nd Tuesday of the month 1pm-2pm, Journey Room  > Creative Journey  3rd Tuesday of the month 1130am-1pm, Journey Room  > Look Good Feel Better  1st Wednesday of the month 10am-12 noon, Journey Room (Call [place] to register [phone])   Smoking Cessation, Tips for Success If you are ready to quit smoking, congratulations! You have chosen to help yourself be healthier. Cigarettes bring nicotine, tar, carbon monoxide, and other irritants into your body. Your lungs, heart, and blood vessels will be able to work better without these poisons. There are many different ways to quit smoking. Nicotine gum, nicotine patches, a nicotine inhaler, or nicotine nasal spray can help with physical craving. Hypnosis, support groups, and medicines help break the habit of smoking. WHAT THINGS CAN I DO TO MAKE QUITTING EASIER?  Here are some tips to help you quit for good:  Pick a date when  you will quit smoking completely. Tell all of your friends and family about your plan to quit on that date.  Do not try to slowly cut down on the number of cigarettes you are smoking. Pick a quit date and quit smoking completely starting on that  day.  Throw away all cigarettes.   Clean and remove all ashtrays from your home, work, and car.  On a card, write down your reasons for quitting. Carry the card with you and read it when you get the urge to smoke.  Cleanse your body of nicotine. Drink enough water and fluids to keep your urine clear or pale yellow. Do this after quitting to flush the nicotine from your body.  Learn to predict your moods. Do not let a bad situation be your excuse to have a cigarette. Some situations in your life might tempt you into wanting a cigarette.  Never have "just one" cigarette. It leads to wanting another and another. Remind yourself of your decision to quit.  Change habits associated with smoking. If you smoked while driving or when feeling stressed, try other activities to replace smoking. Stand up when drinking your coffee. Brush your teeth after eating. Sit in a different chair when you read the paper. Avoid alcohol while trying to quit, and try to drink fewer caffeinated beverages. Alcohol and caffeine may urge you to smoke.  Avoid foods and drinks that can trigger a desire to smoke, such as sugary or spicy foods and alcohol.  Ask people who smoke not to smoke around you.  Have something planned to do right after eating or having a cup of coffee. For example, plan to take a walk or exercise.  Try a relaxation exercise to calm you down and decrease your stress. Remember, you may be tense and nervous for the first 2 weeks after you quit, but this will pass.  Find new activities to keep your hands busy. Play with a pen, coin, or rubber band. Doodle or draw things on paper.  Brush your teeth right after eating. This will help cut down on the craving for the taste of tobacco after meals. You can also try mouthwash.   Use oral substitutes in place of cigarettes. Try using lemon drops, carrots, cinnamon sticks, or chewing gum. Keep them handy so they are available when you have the urge to  smoke.  When you have the urge to smoke, try deep breathing.  Designate your home as a nonsmoking area.  If you are a heavy smoker, ask your health care provider about a prescription for nicotine chewing gum. It can ease your withdrawal from nicotine.  Reward yourself. Set aside the cigarette money you save and buy yourself something nice.  Look for support from others. Join a support group or smoking cessation program. Ask someone at home or at work to help you with your plan to quit smoking.  Always ask yourself, "Do I need this cigarette or is this just a reflex?" Tell yourself, "Today, I choose not to smoke," or "I do not want to smoke." You are reminding yourself of your decision to quit.  Do not replace cigarette smoking with electronic cigarettes (commonly called e-cigarettes). The safety of e-cigarettes is unknown, and some may contain harmful chemicals.  If you relapse, do not give up! Plan ahead and think about what you will do the next time you get the urge to smoke. HOW WILL I FEEL WHEN I QUIT SMOKING? You may have symptoms of withdrawal because  your body is used to nicotine (the addictive substance in cigarettes). You may crave cigarettes, be irritable, feel very hungry, cough often, get headaches, or have difficulty concentrating. The withdrawal symptoms are only temporary. They are strongest when you first quit but will go away within 10-14 days. When withdrawal symptoms occur, stay in control. Think about your reasons for quitting. Remind yourself that these are signs that your body is healing and getting used to being without cigarettes. Remember that withdrawal symptoms are easier to treat than the major diseases that smoking can cause.  Even after the withdrawal is over, expect periodic urges to smoke. However, these cravings are generally short lived and will go away whether you smoke or not. Do not smoke! WHAT RESOURCES ARE AVAILABLE TO HELP ME QUIT SMOKING? Your health care  provider can direct you to community resources or hospitals for support, which may include:  Group support.  Education.  Hypnosis.  Therapy.   This information is not intended to replace advice given to you by your health care provider. Make sure you discuss any questions you have with your health care provider.   Document Released: 02/07/2004 Document Revised: 06/01/2014 Document Reviewed: 10/27/2012 Elsevier Interactive Patient Education 2016 [employer].   Steps to Quit Smoking  Smoking tobacco can be harmful to your health and can affect almost every organ in your body. Smoking puts you, and those around you, at risk for developing many serious chronic diseases. Quitting smoking is difficult, but it is one of the best things that you can do for your health. It is never too late to quit. WHAT ARE THE BENEFITS OF QUITTING SMOKING? When you quit smoking, you lower your risk of developing serious diseases and conditions, such as:  Lung cancer or lung disease, such as COPD.  Heart disease.  Stroke.  Heart attack.  Infertility.  Osteoporosis and bone fractures. Additionally, symptoms such as coughing, wheezing, and shortness of breath may get better when you quit. You may also find that you get sick less often because your body is stronger at fighting off colds and infections. If you are pregnant, quitting smoking can help to reduce your chances of having a baby of low birth weight. HOW DO I GET READY TO QUIT? When you decide to quit smoking, create a plan to make sure that you are successful. Before you quit:  Pick a date to quit. Set a date within the next two weeks to give you time to prepare.  Write down the reasons why you are quitting. Keep this list in places where you will see it often, such as on your bathroom mirror or in your car or wallet.  Identify the people, places, things, and activities that make you want to smoke (triggers) and avoid them. Make sure to take  these actions:  Throw away all cigarettes at home, at work, and in your car.  Throw away smoking accessories, such as Scientist, research (medical).  Clean your car and make sure to empty the ashtray.  Clean your home, including curtains and carpets.  Tell your family, friends, and coworkers that you are quitting. Support from your loved ones can make quitting easier.  Talk with your health care provider about your options for quitting smoking.  Find out what treatment options are covered by your health insurance. WHAT STRATEGIES CAN I USE TO QUIT SMOKING?  Talk with your healthcare provider about different strategies to quit smoking. Some strategies include:  Quitting smoking altogether instead of gradually  lessening how much you smoke over a period of time. Research shows that quitting "cold [place]" is more successful than gradually quitting.  Attending in-person counseling to help you build problem-solving skills. You are more likely to have success in quitting if you attend several counseling sessions. Even short sessions of 10 minutes can be effective.  Finding resources and support systems that can help you to quit smoking and remain smoke-free after you quit. These resources are most helpful when you use them often. They can include:  Online chats with a Social worker.  Telephone quitlines.  Printed Furniture conservator/restorer.  Support groups or group counseling.  Text messaging programs.  Mobile phone applications.  Taking medicines to help you quit smoking. (If you are pregnant or breastfeeding, talk with your health care provider first.) Some medicines contain nicotine and some do not. Both types of medicines help with cravings, but the medicines that include nicotine help to relieve withdrawal symptoms. Your health care provider may recommend:  Nicotine patches, gum, or lozenges.  Nicotine inhalers or sprays.  Non-nicotine medicine that is taken by mouth. Talk with your health care  provider about combining strategies, such as taking medicines while you are also receiving in-person counseling. Using these two strategies together makes you more likely to succeed in quitting than if you used either strategy on its own. If you are pregnant or breastfeeding, talk with your health care provider about finding counseling or other support strategies to quit smoking. Do not take medicine to help you quit smoking unless told to do so by your health care provider. WHAT THINGS CAN I DO TO MAKE IT EASIER TO QUIT? Quitting smoking might feel overwhelming at first, but there is a lot that you can do to make it easier. Take these important actions:  Reach out to your family and friends and ask that they support and encourage you during this time. Call telephone quitlines, reach out to support groups, or work with a counselor for support.  Ask people who smoke to avoid smoking around you.  Avoid places that trigger you to smoke, such as bars, parties, or smoke-break areas at work.  Spend time around people who do not smoke.  Lessen stress in your life, because stress can be a smoking trigger for some people. To lessen stress, try:  Exercising regularly.  Deep-breathing exercises.  Yoga.  Meditating.  Performing a body scan. This involves closing your eyes, scanning your body from head to toe, and noticing which parts of your body are particularly tense. Purposefully relax the muscles in those areas.  Download or purchase mobile phone or tablet apps (applications) that can help you stick to your quit plan by providing reminders, tips, and encouragement. There are many free apps, such as QuitGuide from the [employer] Office manager for Disease Control and Prevention). You can find other support for quitting smoking (smoking cessation) through smokefree.gov and other websites. HOW WILL I FEEL WHEN I QUIT SMOKING? Within the first 24 hours of quitting smoking, you may start to feel some withdrawal  symptoms. These symptoms are usually most noticeable 2-3 days after quitting, but they usually do not last beyond 2-3 weeks. Changes or symptoms that you might experience include:  Mood swings.  Restlessness, anxiety, or irritation.  Difficulty concentrating.  Dizziness.  Strong cravings for sugary foods in addition to nicotine.  Mild weight gain.  Constipation.  Nausea.  Coughing or a sore throat.  Changes in how your medicines work in your body.  A depressed mood.  Difficulty sleeping (insomnia). After the first 2-3 weeks of quitting, you may start to notice more positive results, such as:  Improved sense of smell and taste.  Decreased coughing and sore throat.  Slower heart rate.  Lower blood pressure.  Clearer skin.  The ability to breathe more easily.  Fewer sick days. Quitting smoking is very challenging for most people. Do not get discouraged if you are not successful the first time. Some people need to make many attempts to quit before they achieve long-term success. Do your best to stick to your quit plan, and talk with your health care provider if you have any questions or concerns.   This information is not intended to replace advice given to you by your health care provider. Make sure you discuss any questions you have with your health care provider.   Document Released: 05/05/2001 Document Revised: 09/25/2014 Document Reviewed: 09/25/2014 Elsevier Interactive Patient Education [employer].

## 2016-03-24 NOTE — Progress Notes (Signed)
Follow up with H&N patient who is s/p chemoradiation and under going rehabilitation   He is being seen after 1 month trial off TF with reliance on PO intake  Contacted Pt by visiting at office visit   Wt Readings from Last 10 Encounters:  02/27/16 152 lb 1.6 oz (69 kg)  02/03/16 153 lb 1.6 oz (69.4 kg)  01/15/16 150 lb 12.8 oz (68.4 kg)  12/27/15 153 lb 11.2 oz (69.7 kg)  12/17/15 155 lb 6.4 oz (70.5 kg)  11/28/15 154 lb 12.8 oz (70.2 kg)  11/05/15 162 lb 3.2 oz (73.6 kg)  10/22/15 161 lb 6.4 oz (73.2 kg)  10/17/15 170 lb 6.1 oz (77.3 kg)  10/17/15 170 lb 9.6 oz (77.4 kg)   Patient was weighed today at 150 lbs. This is a loss of 2 lbs x 1 month and a loss 5 lbs x 3 months. However, he has been between 150-155 lbs for nearly 4 months.   Patient reports oral intake as only fair, but says it is improving. This past month he was eating 1-2 meals and drinking 2-4 nutritional supplements daily. Still has odynophagia and jaw soreness the mornings after he has eaten a lot. He has still been flushing his feeding tube 2x a day.   Despite his weight loss, pt has encouraging news: that he has started to develop more of an appetite. He says today, while he was in the waiting room, he was thinking about sausage and eggs. He says he had family over last Sunday and they had [facility]. After he had finished a bowl, he actually went back for a second portion. This is the first time he has done this since starting treatment. He is very excited about these recent changes.   Although he has reported improved appetite, do not feel he should have his PEG tube removed until he demonstrates weight gain. That said, do not think his loss of a couple lbs warrants restarting his Tube feeding, especially given his exciting report of increased sensation of hunger & drive to eat.   Speech therapist also met with pt today. She reccommended patient stick with softer items to better manage his pain with  chewing/swallowing. Pt still should avoid hard, crunchy foods.   Pt has been drinking a lot of equate from [place] in addition to Boost. He is encouraged to continue to drink these supplements, but to be careful not to overly rely on these and become dependant on them. He should attempt to eat more of his meals and drink supplements in between.   Patient will continue to try to maintain weight without supplemental TF.  Ideally, he would show a gain of ~4-5% bw before tube is removed.   Left coupons for patient.   [name] RD, LDN, [facility] Pager: [phone] 03/24/2016 12:19 PM

## 2016-03-28 ENCOUNTER — Inpatient Hospital Stay (HOSPITAL_COMMUNITY)
Admission: EM | Admit: 2016-03-28 | Discharge: 2016-04-08 | DRG: 329 | Disposition: A | Discharge status: Skilled Nursing Facility | Payer: BLUE CROSS/BLUE SHIELD | Attending: General Surgery | Admitting: General Surgery

## 2016-03-28 ENCOUNTER — Emergency Department (HOSPITAL_COMMUNITY): Payer: BLUE CROSS/BLUE SHIELD | Admitting: Certified Registered"

## 2016-03-28 ENCOUNTER — Other Ambulatory Visit: Payer: Self-pay

## 2016-03-28 ENCOUNTER — Encounter (HOSPITAL_COMMUNITY): Payer: Self-pay

## 2016-03-28 ENCOUNTER — Encounter (HOSPITAL_COMMUNITY): Admission: EM | Disposition: A | Discharge status: Skilled Nursing Facility | Payer: Self-pay | Source: Home / Self Care

## 2016-03-28 ENCOUNTER — Emergency Department (HOSPITAL_COMMUNITY): Payer: BLUE CROSS/BLUE SHIELD

## 2016-03-28 DIAGNOSIS — Z85818 Personal history of malignant neoplasm of other sites of lip, oral cavity, and pharynx: Secondary | ICD-10-CM

## 2016-03-28 DIAGNOSIS — M7989 Other specified soft tissue disorders: Secondary | ICD-10-CM | POA: Diagnosis not present

## 2016-03-28 DIAGNOSIS — Z91030 Bee allergy status: Secondary | ICD-10-CM

## 2016-03-28 DIAGNOSIS — Z888 Allergy status to other drugs, medicaments and biological substances status: Secondary | ICD-10-CM

## 2016-03-28 DIAGNOSIS — K631 Perforation of intestine (nontraumatic): Secondary | ICD-10-CM | POA: Diagnosis present

## 2016-03-28 DIAGNOSIS — I10 Essential (primary) hypertension: Secondary | ICD-10-CM | POA: Diagnosis present

## 2016-03-28 DIAGNOSIS — K567 Ileus, unspecified: Secondary | ICD-10-CM | POA: Diagnosis not present

## 2016-03-28 DIAGNOSIS — E876 Hypokalemia: Secondary | ICD-10-CM | POA: Diagnosis present

## 2016-03-28 DIAGNOSIS — F17210 Nicotine dependence, cigarettes, uncomplicated: Secondary | ICD-10-CM | POA: Diagnosis present

## 2016-03-28 DIAGNOSIS — E871 Hypo-osmolality and hyponatremia: Secondary | ICD-10-CM | POA: Diagnosis present

## 2016-03-28 DIAGNOSIS — K7031 Alcoholic cirrhosis of liver with ascites: Secondary | ICD-10-CM | POA: Diagnosis present

## 2016-03-28 DIAGNOSIS — K650 Generalized (acute) peritonitis: Secondary | ICD-10-CM | POA: Diagnosis present

## 2016-03-28 DIAGNOSIS — Z811 Family history of alcohol abuse and dependence: Secondary | ICD-10-CM | POA: Diagnosis not present

## 2016-03-28 DIAGNOSIS — Z23 Encounter for immunization: Secondary | ICD-10-CM | POA: Diagnosis not present

## 2016-03-28 DIAGNOSIS — I808 Phlebitis and thrombophlebitis of other sites: Secondary | ICD-10-CM | POA: Diagnosis not present

## 2016-03-28 DIAGNOSIS — K766 Portal hypertension: Secondary | ICD-10-CM | POA: Diagnosis present

## 2016-03-28 DIAGNOSIS — R198 Other specified symptoms and signs involving the digestive system and abdomen: Secondary | ICD-10-CM

## 2016-03-28 DIAGNOSIS — Z923 Personal history of irradiation: Secondary | ICD-10-CM | POA: Diagnosis not present

## 2016-03-28 DIAGNOSIS — R509 Fever, unspecified: Secondary | ICD-10-CM

## 2016-03-28 DIAGNOSIS — K5900 Constipation, unspecified: Secondary | ICD-10-CM

## 2016-03-28 DIAGNOSIS — R1030 Lower abdominal pain, unspecified: Secondary | ICD-10-CM | POA: Diagnosis not present

## 2016-03-28 HISTORY — PX: LAPAROTOMY: SHX154

## 2016-03-28 LAB — CBC WITH DIFFERENTIAL/PLATELET
Basophils Absolute: 0.1 10*3/uL (ref 0.0–0.1)
Basophils Relative: 1 %
Eosinophils Absolute: 0.1 10*3/uL (ref 0.0–0.7)
Eosinophils Relative: 1 %
HEMATOCRIT: 39.5 % (ref 39.0–52.0)
HEMOGLOBIN: 13.5 g/dL (ref 13.0–17.0)
LYMPHS PCT: 11 %
Lymphs Abs: 0.8 10*3/uL (ref 0.7–4.0)
MCH: 29.7 pg (ref 26.0–34.0)
MCHC: 34.2 g/dL (ref 30.0–36.0)
MCV: 86.8 fL (ref 78.0–100.0)
MONO ABS: 0.4 10*3/uL (ref 0.1–1.0)
MONOS PCT: 5 %
NEUTROS ABS: 6.4 10*3/uL (ref 1.7–7.7)
Neutrophils Relative %: 83 %
Platelets: 203 10*3/uL (ref 150–400)
RBC: 4.55 MIL/uL (ref 4.22–5.81)
RDW: 13.8 % (ref 11.5–15.5)
WBC: 7.7 10*3/uL (ref 4.0–10.5)

## 2016-03-28 LAB — COMPREHENSIVE METABOLIC PANEL
ALT: 13 U/L — ABNORMAL LOW (ref 17–63)
ANION GAP: 13 (ref 5–15)
AST: 20 U/L (ref 15–41)
Albumin: 3.9 g/dL (ref 3.5–5.0)
Alkaline Phosphatase: 54 U/L (ref 38–126)
BILIRUBIN TOTAL: 0.6 mg/dL (ref 0.3–1.2)
BUN: 11 mg/dL (ref 6–20)
CO2: 16 mmol/L — ABNORMAL LOW (ref 22–32)
Calcium: 9.2 mg/dL (ref 8.9–10.3)
Chloride: 100 mmol/L — ABNORMAL LOW (ref 101–111)
Creatinine, Ser: 0.91 mg/dL (ref 0.61–1.24)
GFR calc Af Amer: >60 mL/min (ref >60)
Glucose, Bld: 223 mg/dL — ABNORMAL HIGH (ref 65–99)
POTASSIUM: 3.1 mmol/L — AB (ref 3.5–5.1)
Sodium: 129 mmol/L — ABNORMAL LOW (ref 135–145)
TOTAL PROTEIN: 6.6 g/dL (ref 6.5–8.1)

## 2016-03-28 LAB — LIPASE, BLOOD: Lipase: 39 U/L (ref 11–51)

## 2016-03-28 LAB — ABO/RH: ABO/RH(D): A POS

## 2016-03-28 LAB — MRSA PCR SCREENING: MRSA BY PCR: NEGATIVE

## 2016-03-28 LAB — PROTIME-INR
INR: 0.95
PROTHROMBIN TIME: 12.7 s (ref 11.4–15.2)

## 2016-03-28 LAB — PREPARE RBC (CROSSMATCH)

## 2016-03-28 SURGERY — LAPAROTOMY, EXPLORATORY
Anesthesia: General | Site: Abdomen

## 2016-03-28 MED ORDER — HYDROMORPHONE HCL 1 MG/ML IJ SOLN
1.0000 mg | Freq: Once | INTRAMUSCULAR | Status: AC
  Administered 2016-03-28: 1 mg via INTRAVENOUS

## 2016-03-28 MED ORDER — ESCITALOPRAM OXALATE 20 MG PO TABS
20.0000 mg | ORAL_TABLET | Freq: Every day | ORAL | Status: DC
  Administered 2016-03-28 – 2016-04-08 (×12): 20 mg via ORAL
  Filled 2016-03-28 (×12): qty 1

## 2016-03-28 MED ORDER — PHENYLEPHRINE HCL 10 MG/ML IJ SOLN
INTRAVENOUS | Status: DC | PRN
  Administered 2016-03-28: 25 ug/min via INTRAVENOUS

## 2016-03-28 MED ORDER — PROMETHAZINE HCL 25 MG/ML IJ SOLN
6.2500 mg | INTRAMUSCULAR | Status: DC | PRN
  Filled 2016-03-28: qty 1

## 2016-03-28 MED ORDER — POTASSIUM CHLORIDE 10 MEQ/100ML IV SOLN
10.0000 meq | Freq: Once | INTRAVENOUS | Status: AC
  Administered 2016-03-28: 10 meq via INTRAVENOUS
  Filled 2016-03-28: qty 100

## 2016-03-28 MED ORDER — SUCCINYLCHOLINE CHLORIDE 200 MG/10ML IV SOSY
PREFILLED_SYRINGE | INTRAVENOUS | Status: DC | PRN
  Administered 2016-03-28: 100 mg via INTRAVENOUS

## 2016-03-28 MED ORDER — IOPAMIDOL (ISOVUE-300) INJECTION 61%
100.0000 mL | Freq: Once | INTRAVENOUS | Status: AC | PRN
  Administered 2016-03-28: 100 mL via INTRAVENOUS

## 2016-03-28 MED ORDER — IOPAMIDOL (ISOVUE-300) INJECTION 61%
INTRAVENOUS | Status: AC
  Administered 2016-03-28: 15 mL
  Filled 2016-03-28: qty 30

## 2016-03-28 MED ORDER — HYDROMORPHONE HCL 1 MG/ML IJ SOLN
0.2500 mg | INTRAMUSCULAR | Status: DC | PRN
  Administered 2016-03-28: 0.25 mg via INTRAVENOUS

## 2016-03-28 MED ORDER — 0.9 % SODIUM CHLORIDE (POUR BTL) OPTIME
TOPICAL | Status: DC | PRN
  Administered 2016-03-28: 7000 mL

## 2016-03-28 MED ORDER — MIDAZOLAM HCL 2 MG/2ML IJ SOLN
INTRAMUSCULAR | Status: DC | PRN
  Administered 2016-03-28: 2 mg via INTRAVENOUS

## 2016-03-28 MED ORDER — FENTANYL CITRATE (PF) 100 MCG/2ML IJ SOLN
INTRAMUSCULAR | Status: AC
  Filled 2016-03-28: qty 2

## 2016-03-28 MED ORDER — LORAZEPAM 0.5 MG PO TABS
0.5000 mg | ORAL_TABLET | Freq: Four times a day (QID) | ORAL | Status: DC | PRN
Start: 2016-03-28 — End: 2016-03-30
  Administered 2016-03-29 – 2016-03-30 (×3): 0.5 mg via ORAL
  Filled 2016-03-28 (×3): qty 1

## 2016-03-28 MED ORDER — HYDROMORPHONE HCL 1 MG/ML IJ SOLN
INTRAMUSCULAR | Status: AC
  Administered 2016-03-28: 0.5 mg via INTRAVENOUS
  Filled 2016-03-28: qty 1

## 2016-03-28 MED ORDER — DEXTROSE 5 % IV SOLN
1.0000 g | INTRAVENOUS | Status: DC
  Administered 2016-03-28: 1 g via INTRAVENOUS
  Filled 2016-03-28: qty 10

## 2016-03-28 MED ORDER — SODIUM CHLORIDE 0.9 % IV BOLUS (SEPSIS)
1000.0000 mL | Freq: Once | INTRAVENOUS | Status: AC
  Administered 2016-03-28: 1000 mL via INTRAVENOUS

## 2016-03-28 MED ORDER — PNEUMOCOCCAL VAC POLYVALENT 25 MCG/0.5ML IJ INJ
0.5000 mL | INJECTION | INTRAMUSCULAR | Status: AC
  Administered 2016-03-29: 0.5 mL via INTRAMUSCULAR
  Filled 2016-03-28 (×2): qty 0.5

## 2016-03-28 MED ORDER — FENTANYL CITRATE (PF) 100 MCG/2ML IJ SOLN
INTRAMUSCULAR | Status: DC | PRN
  Administered 2016-03-28: 100 ug via INTRAVENOUS

## 2016-03-28 MED ORDER — FENTANYL 12 MCG/HR TD PT72
12.5000 ug | MEDICATED_PATCH | TRANSDERMAL | Status: DC
  Filled 2016-03-28 (×3): qty 1

## 2016-03-28 MED ORDER — PHENYLEPHRINE 40 MCG/ML (10ML) SYRINGE FOR IV PUSH (FOR BLOOD PRESSURE SUPPORT)
PREFILLED_SYRINGE | INTRAVENOUS | Status: AC
  Filled 2016-03-28: qty 20

## 2016-03-28 MED ORDER — DEXTROSE 5 % IV SOLN
2.0000 g | Freq: Once | INTRAVENOUS | Status: AC
  Administered 2016-03-28: 2 g via INTRAVENOUS
  Filled 2016-03-28: qty 2

## 2016-03-28 MED ORDER — LIDOCAINE 2% (20 MG/ML) 5 ML SYRINGE
INTRAMUSCULAR | Status: DC | PRN
  Administered 2016-03-28: 80 mg via INTRAVENOUS

## 2016-03-28 MED ORDER — LORATADINE 10 MG PO TABS
10.0000 mg | ORAL_TABLET | Freq: Every day | ORAL | Status: DC
  Administered 2016-03-28 – 2016-04-08 (×12): 10 mg via ORAL
  Filled 2016-03-28 (×12): qty 1

## 2016-03-28 MED ORDER — HYDROMORPHONE HCL 1 MG/ML IJ SOLN
0.2500 mg | INTRAMUSCULAR | Status: DC | PRN
  Administered 2016-03-28 (×4): 0.5 mg via INTRAVENOUS

## 2016-03-28 MED ORDER — HYDROMORPHONE HCL 1 MG/ML IJ SOLN
INTRAMUSCULAR | Status: AC
  Filled 2016-03-28: qty 1

## 2016-03-28 MED ORDER — ETOMIDATE 2 MG/ML IV SOLN
INTRAVENOUS | Status: DC | PRN
  Administered 2016-03-28: 20 mg via INTRAVENOUS

## 2016-03-28 MED ORDER — FENTANYL 40 MCG/ML IV SOLN
INTRAVENOUS | Status: DC
  Administered 2016-03-28: 20:00:00 via INTRAVENOUS
  Administered 2016-03-29: 120 ug via INTRAVENOUS
  Administered 2016-03-29: 45 ug via INTRAVENOUS
  Administered 2016-03-29: 240 ug via INTRAVENOUS
  Administered 2016-03-29: 150 ug via INTRAVENOUS
  Administered 2016-03-29: 16:00:00 via INTRAVENOUS
  Administered 2016-03-29: 120 ug via INTRAVENOUS
  Administered 2016-03-29: 135 ug via INTRAVENOUS
  Administered 2016-03-29: 150 ug via INTRAVENOUS
  Administered 2016-03-30: 105 ug via INTRAVENOUS
  Administered 2016-03-30: 195 ug via INTRAVENOUS
  Administered 2016-03-30 – 2016-03-31 (×2): 45 ug via INTRAVENOUS
  Administered 2016-03-31: 75 ug via INTRAVENOUS
  Administered 2016-03-31: via INTRAVENOUS
  Administered 2016-03-31 (×2): 45 ug via INTRAVENOUS
  Administered 2016-04-01: 160.8 ug via INTRAVENOUS
  Administered 2016-04-01: 22.33 mL via INTRAVENOUS
  Administered 2016-04-01: 205 ug via INTRAVENOUS
  Administered 2016-04-01: 21:00:00 via INTRAVENOUS
  Administered 2016-04-02: 255 ug via INTRAVENOUS
  Administered 2016-04-02: 105 ug via INTRAVENOUS
  Administered 2016-04-02: 180 ug via INTRAVENOUS
  Administered 2016-04-02: 135 ug via INTRAVENOUS
  Administered 2016-04-02: 180 ug via INTRAVENOUS
  Administered 2016-04-02: 150 ug via INTRAVENOUS
  Administered 2016-04-03: 315 ug via INTRAVENOUS
  Administered 2016-04-03: 105 ug via INTRAVENOUS
  Administered 2016-04-03: 195 ug via INTRAVENOUS
  Administered 2016-04-03: 17:00:00 via INTRAVENOUS
  Administered 2016-04-03: 285 ug via INTRAVENOUS
  Administered 2016-04-03: 135 ug via INTRAVENOUS
  Administered 2016-04-04: 113 ug via INTRAVENOUS
  Administered 2016-04-04 (×2): 165 ug via INTRAVENOUS
  Administered 2016-04-04 (×2): 225 ug via INTRAVENOUS
  Administered 2016-04-04: 14:00:00 via INTRAVENOUS
  Administered 2016-04-04: 135 ug via INTRAVENOUS
  Administered 2016-04-05: 150 ug via INTRAVENOUS
  Administered 2016-04-05: 105 ug via INTRAVENOUS
  Administered 2016-04-05: 75 ug via INTRAVENOUS
  Administered 2016-04-05: 165 ug via INTRAVENOUS
  Administered 2016-04-05: 120 ug via INTRAVENOUS
  Administered 2016-04-05: 18:00:00 via INTRAVENOUS
  Administered 2016-04-05: 150 ug via INTRAVENOUS
  Administered 2016-04-06: 255 ug via INTRAVENOUS
  Administered 2016-04-06: 75 ug via INTRAVENOUS
  Administered 2016-04-06: 195 ug via INTRAVENOUS
  Administered 2016-04-06: 105 ug via INTRAVENOUS
  Filled 2016-03-28 (×9): qty 25

## 2016-03-28 MED ORDER — PROPOFOL 10 MG/ML IV BOLUS
INTRAVENOUS | Status: AC
  Filled 2016-03-28: qty 20

## 2016-03-28 MED ORDER — ACETAMINOPHEN 500 MG PO TABS
1000.0000 mg | ORAL_TABLET | Freq: Four times a day (QID) | ORAL | Status: AC
  Administered 2016-03-28 – 2016-03-29 (×4): 1000 mg via ORAL
  Filled 2016-03-28 (×8): qty 2

## 2016-03-28 MED ORDER — ETOMIDATE 2 MG/ML IV SOLN
INTRAVENOUS | Status: AC
  Filled 2016-03-28: qty 10

## 2016-03-28 MED ORDER — SODIUM CHLORIDE 0.9 % IV SOLN
INTRAVENOUS | Status: DC
  Administered 2016-03-28 – 2016-03-31 (×7): via INTRAVENOUS

## 2016-03-28 MED ORDER — SUGAMMADEX SODIUM 200 MG/2ML IV SOLN
INTRAVENOUS | Status: AC
  Filled 2016-03-28: qty 2

## 2016-03-28 MED ORDER — ONDANSETRON HCL 4 MG/2ML IJ SOLN
INTRAMUSCULAR | Status: AC
  Filled 2016-03-28: qty 6

## 2016-03-28 MED ORDER — HYDROMORPHONE HCL 2 MG/ML IJ SOLN
2.0000 mg | Freq: Once | INTRAMUSCULAR | Status: AC
  Administered 2016-03-28: 2 mg via INTRAVENOUS
  Filled 2016-03-28: qty 1

## 2016-03-28 MED ORDER — SUGAMMADEX SODIUM 500 MG/5ML IV SOLN
INTRAVENOUS | Status: AC
  Filled 2016-03-28: qty 5

## 2016-03-28 MED ORDER — DIPHENHYDRAMINE HCL 12.5 MG/5ML PO ELIX: 12.5000 mg | ORAL_SOLUTION | Freq: Four times a day (QID) | ORAL | Status: DC | PRN

## 2016-03-28 MED ORDER — HYDROMORPHONE HCL 1 MG/ML IJ SOLN: 1.0000 mg | Freq: Once | INTRAMUSCULAR | Status: DC

## 2016-03-28 MED ORDER — SODIUM FLUORIDE 1.1 % DT GEL: Freq: Every day | DENTAL | Status: DC

## 2016-03-28 MED ORDER — METRONIDAZOLE IN NACL 5-0.79 MG/ML-% IV SOLN
500.0000 mg | Freq: Once | INTRAVENOUS | Status: AC
  Administered 2016-03-28: 500 mg via INTRAVENOUS
  Filled 2016-03-28: qty 100

## 2016-03-28 MED ORDER — ONDANSETRON HCL 4 MG/2ML IJ SOLN
4.0000 mg | Freq: Four times a day (QID) | INTRAMUSCULAR | Status: DC | PRN
  Administered 2016-03-29 – 2016-04-07 (×17): 4 mg via INTRAVENOUS
  Filled 2016-03-28 (×16): qty 2

## 2016-03-28 MED ORDER — PILOCARPINE HCL 5 MG PO TABS
5.0000 mg | ORAL_TABLET | Freq: Three times a day (TID) | ORAL | Status: DC
  Administered 2016-03-29 – 2016-04-08 (×28): 5 mg via ORAL
  Filled 2016-03-28 (×34): qty 1

## 2016-03-28 MED ORDER — SUGAMMADEX SODIUM 200 MG/2ML IV SOLN
INTRAVENOUS | Status: DC | PRN
  Administered 2016-03-28: 200 mg via INTRAVENOUS

## 2016-03-28 MED ORDER — ALBUMIN HUMAN 5 % IV SOLN
INTRAVENOUS | Status: AC
  Filled 2016-03-28: qty 500

## 2016-03-28 MED ORDER — NALOXONE HCL 0.4 MG/ML IJ SOLN: 0.4000 mg | INTRAMUSCULAR | Status: DC | PRN

## 2016-03-28 MED ORDER — ONDANSETRON HCL 4 MG PO TABS
4.0000 mg | ORAL_TABLET | Freq: Four times a day (QID) | ORAL | Status: DC | PRN
  Administered 2016-04-06: 4 mg via ORAL
  Filled 2016-03-28: qty 1

## 2016-03-28 MED ORDER — MIDAZOLAM HCL 2 MG/2ML IJ SOLN
INTRAMUSCULAR | Status: AC
  Filled 2016-03-28: qty 2

## 2016-03-28 MED ORDER — METRONIDAZOLE IN NACL 5-0.79 MG/ML-% IV SOLN
500.0000 mg | Freq: Three times a day (TID) | INTRAVENOUS | Status: DC
  Administered 2016-03-28 – 2016-04-05 (×24): 500 mg via INTRAVENOUS
  Filled 2016-03-28 (×24): qty 100

## 2016-03-28 MED ORDER — ROCURONIUM BROMIDE 50 MG/5ML IV SOSY
PREFILLED_SYRINGE | INTRAVENOUS | Status: AC
  Filled 2016-03-28: qty 10

## 2016-03-28 MED ORDER — HYDROMORPHONE HCL 1 MG/ML IJ SOLN
1.0000 mg | Freq: Once | INTRAMUSCULAR | Status: AC
  Administered 2016-03-28: 1 mg via INTRAVENOUS
  Filled 2016-03-28: qty 1

## 2016-03-28 MED ORDER — ONDANSETRON HCL 4 MG/2ML IJ SOLN
INTRAMUSCULAR | Status: DC | PRN
  Administered 2016-03-28: 4 mg via INTRAVENOUS

## 2016-03-28 MED ORDER — ENOXAPARIN SODIUM 40 MG/0.4ML ~~LOC~~ SOLN
40.0000 mg | SUBCUTANEOUS | Status: DC
  Administered 2016-03-29 – 2016-04-08 (×11): 40 mg via SUBCUTANEOUS
  Filled 2016-03-28 (×11): qty 0.4

## 2016-03-28 MED ORDER — METOCLOPRAMIDE HCL 5 MG/ML IJ SOLN
10.0000 mg | Freq: Once | INTRAMUSCULAR | Status: AC
  Administered 2016-03-28: 10 mg via INTRAVENOUS
  Filled 2016-03-28: qty 2

## 2016-03-28 MED ORDER — ROCURONIUM BROMIDE 10 MG/ML (PF) SYRINGE
PREFILLED_SYRINGE | INTRAVENOUS | Status: DC | PRN
  Administered 2016-03-28: 40 mg via INTRAVENOUS
  Administered 2016-03-28: 10 mg via INTRAVENOUS

## 2016-03-28 MED ORDER — ALBUMIN HUMAN 5 % IV SOLN
INTRAVENOUS | Status: DC | PRN
  Administered 2016-03-28 (×2): via INTRAVENOUS

## 2016-03-28 MED ORDER — SODIUM CHLORIDE 0.9% FLUSH: 9.0000 mL | INTRAVENOUS | Status: DC | PRN

## 2016-03-28 MED ORDER — LACTATED RINGERS IV SOLN: INTRAVENOUS | Status: DC | PRN

## 2016-03-28 MED ORDER — LIDOCAINE 2% (20 MG/ML) 5 ML SYRINGE
INTRAMUSCULAR | Status: AC
  Filled 2016-03-28: qty 10

## 2016-03-28 MED ORDER — DIPHENHYDRAMINE HCL 50 MG/ML IJ SOLN: 12.5000 mg | Freq: Four times a day (QID) | INTRAMUSCULAR | Status: DC | PRN

## 2016-03-28 MED ORDER — ORAL CARE MOUTH RINSE
15.0000 mL | Freq: Two times a day (BID) | OROMUCOSAL | Status: DC
  Administered 2016-03-28 – 2016-04-08 (×14): 15 mL via OROMUCOSAL

## 2016-03-28 SURGICAL SUPPLY — 54 items
BLADE EXTENDED COATED 6.5IN (ELECTRODE) ×2 IMPLANT
BNDG GAUZE ELAST 4 BULKY (GAUZE/BANDAGES/DRESSINGS) ×2 IMPLANT
CELLS DAT CNTRL 66122 CELL SVR (MISCELLANEOUS) IMPLANT
CHLORAPREP W/TINT 26ML (MISCELLANEOUS) ×2 IMPLANT
CLAMP POUCH DRAINAGE QUIET (OSTOMY) ×2 IMPLANT
COUNTER NEEDLE 20 DBL MAG RED (NEEDLE) IMPLANT
COVER MAYO STAND STRL (DRAPES) IMPLANT
COVER SURGICAL LIGHT HANDLE (MISCELLANEOUS) IMPLANT
DRAIN CHANNEL 19F RND (DRAIN) IMPLANT
DRAPE LAPAROSCOPIC ABDOMINAL (DRAPES) IMPLANT
DRAPE SHEET LG 3/4 BI-LAMINATE (DRAPES) IMPLANT
DRSG OPSITE POSTOP 4X10 (GAUZE/BANDAGES/DRESSINGS) IMPLANT
DRSG OPSITE POSTOP 4X6 (GAUZE/BANDAGES/DRESSINGS) IMPLANT
DRSG OPSITE POSTOP 4X8 (GAUZE/BANDAGES/DRESSINGS) IMPLANT
ELECT PENCIL ROCKER SW 15FT (MISCELLANEOUS) ×2 IMPLANT
ELECT REM PT RETURN 15FT ADLT (MISCELLANEOUS) IMPLANT
EVACUATOR SILICONE 100CC (DRAIN) IMPLANT
GAUZE SPONGE 4X4 12PLY STRL (GAUZE/BANDAGES/DRESSINGS) IMPLANT
GLOVE BIO SURGEON STRL SZ 6.5 (GLOVE) ×4 IMPLANT
GLOVE BIOGEL PI IND STRL 7.0 (GLOVE) ×2 IMPLANT
GLOVE BIOGEL PI INDICATOR 7.0 (GLOVE) ×2
GOWN STRL REUS W/TWL 2XL LVL3 (GOWN DISPOSABLE) ×2 IMPLANT
GOWN STRL REUS W/TWL XL LVL3 (GOWN DISPOSABLE) ×2 IMPLANT
HANDLE SUCTION POOLE (INSTRUMENTS) ×1 IMPLANT
LEGGING LITHOTOMY PAIR STRL (DRAPES) IMPLANT
LIGASURE IMPACT 36 18CM CVD LR (INSTRUMENTS) ×2 IMPLANT
PACK COLON (CUSTOM PROCEDURE TRAY) IMPLANT
PACK GENERAL/GYN (CUSTOM PROCEDURE TRAY) ×2 IMPLANT
PAD ABD 8X10 STRL (GAUZE/BANDAGES/DRESSINGS) ×2 IMPLANT
POUCH OSTOMY 1 3/4  H (OSTOMY) ×2 IMPLANT
RETRACTOR WND ALEXIS 25 LRG (MISCELLANEOUS) IMPLANT
RTRCTR WOUND ALEXIS 18CM MED (MISCELLANEOUS)
RTRCTR WOUND ALEXIS 25CM LRG (MISCELLANEOUS)
SEALER TISSUE X1 CVD JAW (INSTRUMENTS) IMPLANT
SPONGE LAP 18X18 X RAY DECT (DISPOSABLE) ×4 IMPLANT
STAPLER CUT CVD 40MM GREEN (STAPLE) ×2 IMPLANT
STAPLER VISISTAT 35W (STAPLE) ×2 IMPLANT
SUCTION POOLE HANDLE (INSTRUMENTS) ×2
SUT ETHILON 3 0 PS 1 (SUTURE) IMPLANT
SUT NOVA NAB GS-21 0 18 T12 DT (SUTURE) IMPLANT
SUT PDS AB 1 CTX 36 (SUTURE) ×4 IMPLANT
SUT PROLENE 0 CT 1 30 (SUTURE) ×4 IMPLANT
SUT SILK 2 0 (SUTURE)
SUT SILK 2 0 SH CR/8 (SUTURE) IMPLANT
SUT SILK 2-0 18XBRD TIE 12 (SUTURE) IMPLANT
SUT SILK 3 0 (SUTURE)
SUT SILK 3 0 SH CR/8 (SUTURE) IMPLANT
SUT SILK 3-0 18XBRD TIE 12 (SUTURE) IMPLANT
SUT VIC AB 2-0 SH 18 (SUTURE) ×4 IMPLANT
SYR BULB IRRIGATION 50ML (SYRINGE) ×2 IMPLANT
TAPE CLOTH SURG 6X10 WHT LF (GAUZE/BANDAGES/DRESSINGS) ×2 IMPLANT
TOWEL OR 17X26 10 PK STRL BLUE (TOWEL DISPOSABLE) ×2 IMPLANT
TOWEL OR NON WOVEN STRL DISP B (DISPOSABLE) ×2 IMPLANT
TRAY FOLEY W/METER SILVER 16FR (SET/KITS/TRAYS/PACK) ×2 IMPLANT

## 2016-03-28 NOTE — ED Notes (Signed)
Pt's wallet found. Nurse called pt's home phone number and left message that pt could pick up from security when released from the hospital

## 2016-03-28 NOTE — ED Notes (Signed)
Pt unable to obtain urine sample at this time 

## 2016-03-28 NOTE — Anesthesia Procedure Notes (Signed)
Procedure Name: Intubation Date/Time: 03/28/2016 4:46 PM Performed by: Cynda Familia Pre-anesthesia Checklist: Patient identified, Emergency Drugs available, Suction available and Patient being monitored Patient Re-evaluated:Patient Re-evaluated prior to inductionOxygen Delivery Method: Circle System Utilized Preoxygenation: Pre-oxygenation with 100% oxygen Intubation Type: IV induction Ventilation: Mask ventilation without difficulty Grade View: Grade I Tube type: Subglottic suction tube Tube size: 7.5 mm Number of attempts: 1 Airway Equipment and Method: Stylet and Video-laryngoscopy Placement Confirmation: ETT inserted through vocal cords under direct vision,  positive ETCO2 and breath sounds checked- equal and bilateral Secured at: 22 cm Tube secured with: Tape Dental Injury: Teeth and Oropharynx as per pre-operative assessment  Comments: Smooth IV induction-- by [place]--- intubation Glidescope AM CRNA-- atraumatic-- teeth and mouth as preop-- irregular surfaces front teeth prior to laryngoscopy-- bilat BS [place]

## 2016-03-28 NOTE — ED Notes (Signed)
Report given to charge RN Manuela Schwartz at [facility] ED.

## 2016-03-28 NOTE — Anesthesia Postprocedure Evaluation (Signed)
Anesthesia Post Note  Patient: Haden Caccamo  Procedure(s) Performed: Procedure(s) (LRB): EXPLORATORY LAPAROTOMY, SIGMOID COLECTOMY, COLOSTOMY (N/A)  Patient location during evaluation: PACU Anesthesia Type: General Level of consciousness: awake, sedated and responds to stimulation Pain management: pain level controlled Vital Signs Assessment: post-procedure vital signs reviewed and stable Respiratory status: spontaneous breathing, nonlabored ventilation, respiratory function stable and patient connected to nasal cannula oxygen Cardiovascular status: blood pressure returned to baseline and stable Postop Assessment: no signs of nausea or vomiting Anesthetic complications: no    Last Vitals:  Vitals:   03/28/16 1830 03/28/16 1845  BP: 140/75 131/81  Pulse: (!) 107 (!) 109  Resp: 17 16  Temp:  36.4 C    Last Pain:  Vitals:   03/28/16 1900  TempSrc:   PainSc: 8                  Vaishnav Demartin,JAMES TERRILL

## 2016-03-28 NOTE — ED Triage Notes (Signed)
Complain  of lower abdominal pain. States the pain is 10/10 after EMS gave him Morphine 10 mg IV

## 2016-03-28 NOTE — ED Notes (Signed)
ED Provider at bedside. 

## 2016-03-28 NOTE — Anesthesia Preprocedure Evaluation (Addendum)
Anesthesia Evaluation  Patient identified by MRN, date of birth, ID bandGeneral Assessment Comment:Tonsillar ca  Reviewed: Allergy & Precautions, NPO status , Patient's Chart, lab work & pertinent test results  Airway Mallampati: II  TM Distance: >3 FB Neck ROM: Full    Dental  (+) Teeth Intact, Dental Advisory Given   Pulmonary Current Smoker,    breath sounds clear to auscultation       Cardiovascular hypertension,  Rhythm:Regular Rate:Tachycardia     Neuro/Psych    GI/Hepatic (+) Cirrhosis       ,   Endo/Other    Renal/GU      Musculoskeletal   Abdominal (+)  Abdomen: rigid.    Peds  Hematology   Anesthesia Other Findings   Reproductive/Obstetrics                            Anesthesia Physical Anesthesia Plan  ASA: III  Anesthesia Plan: General   Post-op Pain Management:    Induction: Intravenous, Rapid sequence and Cricoid pressure planned  Airway Management Planned: Video Laryngoscope Planned and Oral ETT  Additional Equipment:   Intra-op Plan:   Post-operative Plan: Possible Post-op intubation/ventilation  Informed Consent: I have reviewed the patients History and Physical, chart, labs and discussed the procedure including the risks, benefits and alternatives for the proposed anesthesia with the patient or authorized representative who has indicated his/her understanding and acceptance.     Plan Discussed with:   Anesthesia Plan Comments:        Anesthesia Quick Evaluation

## 2016-03-28 NOTE — Op Note (Signed)
03/28/2016  6:14 PM  PATIENT:  Frank Castillo  65 y.o. male  Patient Care Team: Celedonio Savage, MD as PCP - General (Family Medicine) Eppie Gibson, MD as Attending Physician (Radiation Oncology) Patrici Ranks, MD as Consulting Physician (Hematology and Oncology)  PRE-OPERATIVE DIAGNOSIS:  colon perforation  POST-OPERATIVE DIAGNOSIS:  Sigmoid colon perforation  PROCEDURE:   EXPLORATORY LAPAROTOMY, SIGMOID COLECTOMY, END COLOSTOMY   Surgeon(s): Leighton Ruff, MD  ASSISTANT: none   ANESTHESIA:   general  EBL:  Total I/O In: 700 [IV Piggyback:700] Out: [id number] [Urine:450; Other:700; Blood:300]  DRAINS: none   SPECIMEN:  Source of Specimen:  sigmoid colon  DISPOSITION OF SPECIMEN:  PATHOLOGY  COUNTS:  YES  PLAN OF CARE: Admit to inpatient   PATIENT DISPOSITION:  PACU, extubated  INDICATION: 65 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 CT scan shows diffuse free air. Physical exam shows diffuse peritonitis.   OR FINDINGS: Purulence and stool throughout the abdomen. Large perforation in distal sigmoid colon  DESCRIPTION: the patient was identified in the preoperative holding area and taken to the OR where they were laid supine on the operating room table.  General anesthesia was induced without difficulty. SCDs were also noted to be in place prior to the initiation of anesthesia.  The patient was then prepped and draped in the usual sterile fashion.   A surgical timeout was performed indicating the correct patient, procedure, positioning and need for preoperative antibiotics.   I made a periumbilical incision using a 10 blade scalpel. This was carried down through subcutaneous tissues using electrocautery. The fascia was divided at midline. The peritoneum was entered bluntly and divided. Purulent ascites was encountered. This was suctioned out of the pelvis. Frank stool was noted throughout the  abdomen. I evaluated the entire abdomen. The patient had multiple diverticulum and hard stool throughout the colon. I palpated the stomach. The G-tube was in place. There appeared to be no inflammation around the liver or duodenum. I opened the incision inferiorly and traced the sigmoid colon down towards the pelvis. I freed it off from the peritoneal attachments using electrocautery.  Once this was completed, I admitted a 5 a approximately 3-4 cm perforation in the sigmoid colon. I bluntly divided just proximal to the peritoneal reflection through the mesentery of the colon. This was then transected using a contour stapler. The remaining mesentery was divided using a LigaSure device. I then irrigated the abdomen with approximately 6 L of warm saline. I attached the rectal stump to the anterior abdominal wall using 2-0 Prolene sutures at either end of the staple line. I then made a defect in the left abdominal wall over the rectus muscle for a colostomy. Dissection was carried down to the fascia and a cruciate incision was made. The rectus muscle was split and the peritoneum was divided with electrocautery. The ostomy site was dilated to 2 fingerbreadths and the colon was pulled through this. Once adequate length for colostomy could be obtained and viable: The remaining colon was transected using electrocautery. This was sent to pathology for further examination.    After this was completed the fascia was closed using 2-0 PDS sutures in a running fashion. The incision was then packed and a dressing was applied.  The end colostomy was matured using standard Brook technique with 2-0 Vicryl sutures. A colostomy appliance was placed. The patient's G-tube was placed to straight drain.  The patient was then awakened from  anesthesia and sent to the postanesthesia care unit in stable condition. All counts were correct per operating room staff.

## 2016-03-28 NOTE — ED Notes (Signed)
Per [facility] pt transferred to [facility] from [facility] for general surgery of a perforated bowel. Pt c/o pain after receiving a total of 6 rounds of dilaudid at [place]. Pt has a port in his right chest accessed at [place] with saline infusing.

## 2016-03-28 NOTE — Transfer of Care (Signed)
Immediate Anesthesia Transfer of Care Note  Patient: Frank Castillo  Procedure(s) Performed: Procedure(s): EXPLORATORY LAPAROTOMY, SIGMOID COLECTOMY, COLOSTOMY (N/A)  Patient Location: PACU  Anesthesia Type:General  Level of Consciousness: awake and alert   Airway & Oxygen Therapy: Patient Spontanous Breathing and Patient connected to face mask oxygen  Post-op Assessment: Report given to RN and Post -op Vital signs reviewed and stable  Post vital signs: Reviewed and stable  Last Vitals:  Vitals:   03/28/16 1500 03/28/16 1550  BP: 125/94 114/80  Pulse: 116 118  Resp: 20 20  Temp: 36.6 C 36.4 C    Last Pain:  Vitals:   03/28/16 1550  TempSrc: Oral  PainSc:          Complications: No apparent anesthesia complications

## 2016-03-28 NOTE — ED Notes (Signed)
Pt assisted to the bathroom.

## 2016-03-28 NOTE — ED Notes (Signed)
Pt transported to CT ?

## 2016-03-28 NOTE — ED Notes (Signed)
Pt reports worsening pain, especially after drinking contrast. EDP notified and verbal orders given. Pt reminded that urine specimen was needed. Encourage pt to attempt to finish drinking oral contrast.

## 2016-03-28 NOTE — ED Provider Notes (Signed)
Patient transferred from [facility] for surgical evaluation for perforated viscus.  Surgery consulted for further management.  Dr. Marcello Moores contacted and will see the patient.    Quintella Reichert, MD 03/28/16 1715

## 2016-03-28 NOTE — ED Notes (Signed)
Pt given oral contrast to drink.

## 2016-03-28 NOTE — ED Notes (Signed)
Dr. Jenkins at bedside. 

## 2016-03-28 NOTE — H&P (Signed)
Frank Castillo is an 65 y.o. male.   Chief Complaint: abd pain HPI: 65 y.o. M with a h/o liver cirrhosis and tonsilar cancer, s/p XRT completed in May 17.  Reports several days of intermittent nausea followed by severe, acute onset, abdominal pain this am.  Denies any fevers.  Reported to [facility] ED and was found to have diffuse peritonitis and free air on CT scan.  Was transferred here due to h/o radiation to the neck and no anesthesiologist on call at [facility].  Pt takes chronic narcotics and has been weaning off these recently and increasing his ibuprofen use.  He is also a smoker.  Past Medical History:  Diagnosis Date  . Cirrhosis ([place])   . Hypertension   . Portal hypertension ([place])   . Tonsillar mass    left    Past Surgical History:  Procedure Laterality Date  . HERNIA REPAIR Right   . MOUTH SURGERY    . TONSILLECTOMY Left 07/09/2015   Procedure: LEFT TONSILLECTOMY;  Surgeon: Leta Baptist, MD;  Location: [facility];  Service: ENT;  Laterality: Left;    Family History  Problem Relation Age of Onset  . Macular degeneration Mother   . Alcoholism Father    Social History:  reports that he has been smoking Cigarettes.  He has been smoking about 0.50 packs per day. He has never used smokeless tobacco. He reports that he does not drink alcohol or use drugs.  Allergies:  Allergies  Allergen Reactions  . Bee Venom Swelling  . Other     Opthalmic mycin irritates eyes.      (Not in a hospital admission)  Results for orders placed or performed during the hospital encounter of 03/28/16 (from the past 48 hour(s))  Comprehensive metabolic panel     Status: Abnormal   Collection Time: 03/28/16  8:24 AM  Result Value Ref Range   Sodium 129 (L) 135 - 145 mmol/L   Potassium 3.1 (L) 3.5 - 5.1 mmol/L   Chloride 100 (L) 101 - 111 mmol/L   CO2 16 (L) 22 - 32 mmol/L   Glucose, Bld 223 (H) 65 - 99 mg/dL   BUN 11 6 - 20 mg/dL   Creatinine, Ser 0.91 0.61 - 1.24 mg/dL   Calcium 9.2 8.9 -  10.3 mg/dL   Total Protein 6.6 6.5 - 8.1 g/dL   Albumin 3.9 3.5 - 5.0 g/dL   AST 20 15 - 41 U/L   ALT 13 (L) 17 - 63 U/L   Alkaline Phosphatase 54 38 - 126 U/L   Total Bilirubin 0.6 0.3 - 1.2 mg/dL   GFR calc non Af Amer >60 >60 mL/min   GFR calc Af Amer >60 >60 mL/min    Comment: (NOTE) The eGFR has been calculated using the CKD EPI equation. This calculation has not been validated in all clinical situations. eGFR's persistently <60 mL/min signify possible Chronic Kidney Disease.    Anion gap 13 5 - 15  CBC with Differential/Platelet     Status: None   Collection Time: 03/28/16  8:24 AM  Result Value Ref Range   WBC 7.7 4.0 - 10.5 K/uL   RBC 4.55 4.22 - 5.81 MIL/uL   Hemoglobin 13.5 13.0 - 17.0 g/dL   HCT 39.5 39.0 - 52.0 %   MCV 86.8 78.0 - 100.0 fL   MCH 29.7 26.0 - 34.0 pg   MCHC 34.2 30.0 - 36.0 g/dL   RDW 13.8 11.5 - 15.5 %   Platelets  203 150 - 400 K/uL   Neutrophils Relative % 83 %   Neutro Abs 6.4 1.7 - 7.7 K/uL   Lymphocytes Relative 11 %   Lymphs Abs 0.8 0.7 - 4.0 K/uL   Monocytes Relative 5 %   Monocytes Absolute 0.4 0.1 - 1.0 K/uL   Eosinophils Relative 1 %   Eosinophils Absolute 0.1 0.0 - 0.7 K/uL   Basophils Relative 1 %   Basophils Absolute 0.1 0.0 - 0.1 K/uL   WBC Morphology WHITE COUNT CONFIRMED ON SMEAR   Lipase, blood     Status: None   Collection Time: 03/28/16  8:24 AM  Result Value Ref Range   Lipase 39 11 - 51 U/L  Protime-INR     Status: None   Collection Time: 03/28/16  8:24 AM  Result Value Ref Range   Prothrombin Time 12.7 11.4 - 15.2 seconds   INR 0.95    Ct Abdomen Pelvis W Contrast  Result Date: 03/28/2016 CLINICAL DATA:  Lower abdominal pain since 7 a.m. 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 Sagittal and coronal MPR images reconstructed from axial data set. CONTRAST:  1 ISOVUE-300 IOPAMIDOL (ISOVUE-300) INJECTION 61%, 171m ISOVUE-300 IOPAMIDOL (ISOVUE-300) INJECTION 61% COMPARISON:  None FINDINGS: Lower chest: Lung bases clear Hepatobiliary: Calcified gallstones in gallbladder. Nodular cirrhotic appearing liver without focal hepatic mass. No biliary dilatation. Pancreas: Normal appearance Spleen: Normal appearance, small in size. Adrenals/Urinary Tract: Adrenal glands and kidneys normal appearance. No hydronephrosis or ureteral dilatation. Bladder unremarkable. Stomach/Bowel: Diffuse colonic diverticulosis. Gastrostomy tube in stomach. Diffuse proximal small bowel loops in the LEFT upper quadrant demonstrate mild wall thickening and hyperemia within the mesenteries question enteritis. Minimal scattered colonic wall thickening distally. Focal gas collection RIGHT lateral to the urinary bladder, suspect extraluminal gas, does not clearly communicate with bowel, overall measuring 4.5 x 2.7 x 2.8 cm. Appendix upper normal caliber with multiple small diverticula. Vascular/Lymphatic: Atherosclerotic calcifications aorta without aneurysm. Coronary arterial calcification. No adenopathy. Reproductive: N/A Other: Free intraperitoneal air and fluid compatible with perforated viscus. No definite hernia. Musculoskeletal: Degenerative disc disease changes lower lumbar spine. No focal osseous lesions. IMPRESSION: Cirrhotic liver. Cholelithiasis. Free intraperitoneal air and fluid of uncertain etiology ; patient has thickened small bowel loops with hyperemia in the LEFT upper quadrant question small bowel enteritis and diffuse colonic diverticulosis, either of which could be related to potential perforation. The presence of a focal extraluminal gas collection adjacent to the urinary bladder and more free  air/extraluminal gas inferiorly slightly favors potential colonic perforation. No specific changes seen adjacent to the gastrostomy tube to suggest that a dislodged gastrostomy tube is the source of the observed free air. Aortic atherosclerosis. Critical Value/emergent results were called by telephone at the time of interpretation on 03/28/2016 at 12:04 pm to Dr. SOrlie Dakin, who verbally acknowledged these results. Electronically Signed   By: MLavonia DanaM.D.   On: 03/28/2016 12:05    Review of Systems  Constitutional: Positive for fever. Negative for chills and weight loss.  HENT: Negative for congestion.   Eyes: Negative for blurred vision.  Respiratory: Negative for cough and shortness of breath.   Cardiovascular: Negative for chest pain and palpitations.  Gastrointestinal: Positive for abdominal pain  and nausea. Negative for blood in stool and vomiting.  Genitourinary: Negative for dysuria, frequency and urgency.  Skin: Negative for itching and rash.  Neurological: Negative for dizziness and headaches.    Blood pressure 125/94, pulse 116, temperature 97.9 F (36.6 C), temperature source Oral, resp. rate 20, height '5\' 10"'  (1.778 m), weight 68 kg (150 lb), SpO2 95 %. Physical Exam  Constitutional: He is oriented to person, place, and time. He appears well-developed and well-nourished. No distress.  HENT:  Head: Normocephalic and atraumatic.  Eyes: Conjunctivae and EOM are normal. Pupils are equal, round, and reactive to light.  Neck: Normal range of motion. Neck supple.  Cardiovascular: Normal heart sounds.   No murmur heard. Respiratory: Effort normal and breath sounds normal. No respiratory distress.  GI: Soft. There is tenderness (diffusely tender with rebound and guarding). There is rebound and guarding.  Musculoskeletal: Normal range of motion.  Neurological: He is alert and oriented to person, place, and time.     Assessment/Plan Pt with peritonitis on exam and diffuse  free air.  CT read favoring colon perforation.  Pt is not on steroids or blood thinners.  I see no signs of liver failure.  He is not malnourished.  I think the best course of action is exploratory laparotomy.  He is aware that this may mean he could need a colostomy.  The surgery and anatomy were described to the patient as well as the risks of surgery and the possible complications.  These include: Bleeding, deep abdominal infections and possible wound complications such as hernia and infection, damage to adjacent structures, leak of surgical connections, which can lead to other surgeries and possibly an ostomy, possible need for other procedures, such as abscess drains in radiology, possible prolonged hospital stay, possible diarrhea from removal of part of the colon, possible constipation from narcotics, prolonged fatigue/weakness or appetite loss, possible early recurrence of of disease, possible complications of their medical problems such as heart disease or arrhythmias or lung problems, death (less than 1%). I believe the patient understands and wishes to proceed with the surgery.    Rosario Adie., MD 81/[id number]/[id number], 3:38 PM

## 2016-03-28 NOTE — ED Provider Notes (Signed)
[facility] DEPT Provider Note   CSN: [id number] Arrival date & time: 03/28/16  0800     History   Chief Complaint Chief Complaint  Patient presents with  . Abdominal Pain    HPI Frank Castillo is a 65 y.o. male.  HPI Complains of lower abdominal pain onset 6 AM today. Pain is constant and severe, gradual onset. Last bowel movement yesterday, normal. Last urinated this morning. Nothing makes symptoms better or worse. Has never had similar pain before. Treated by EMS with morphine 10 mg IV en route, without relief. He developed nausea on arrival to the emergency department. No other associated symptoms. Past Medical History:  Diagnosis Date  . Cirrhosis ([place])   . Hypertension   . Portal hypertension ([place])   . Tonsillar mass    left    Patient Active Problem List   Diagnosis Date Noted  . Reactive depression (situational) 02/29/2016  . 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 Tobacco use disorder 02/27/2016  . Tinnitus 02/27/2016  . Dysuria 10/25/2015  . Decreased oral intake 10/25/2015  . Malnutrition of moderate degree 10/19/2015  . Antineoplastic chemotherapy induced pancytopenia ([place]) 10/18/2015  . Fever   . Candidiasis of mouth and esophagus ([place]) 10/17/2015  . Neutropenia, febrile ([place]) 10/17/2015  . Shingles rash 10/17/2015  . Nausea with vomiting 09/20/2015  . Hyponatremia 09/20/2015  . Oral pharyngeal candidiasis 09/20/2015  . Antineoplastic chemotherapy induced anemia 09/20/2015  . Hypertension   . Cirrhosis ([place])   . Portal hypertension ([place])   . Cancer of tonsillar fossa ([place]) 08/02/2015    Past Surgical History:  Procedure Laterality Date  . HERNIA REPAIR Right   . MOUTH SURGERY    . TONSILLECTOMY Left 07/09/2015   Procedure: LEFT TONSILLECTOMY;  Surgeon: Leta Baptist, MD;  Location: [facility];  Service: ENT;  Laterality: Left;       Home Medications    Prior to Admission medications   Medication Sig Start Date End Date  Taking? Authorizing Provider  cetirizine (ZYRTEC) 10 MG tablet Take 10 mg by mouth daily.    Historical Provider, MD  cholecalciferol (VITAMIN D) 1000 units tablet Take 1,000 Units by mouth daily.    Historical Provider, MD  escitalopram (LEXAPRO) 20 MG tablet Take 1/2 tablet for 1 week then increase to 1 tablet daily. 02/27/16   Patrici Ranks, MD  LORazepam (ATIVAN) 0.5 MG tablet Take 1 tablet (0.5 mg total) by mouth every 6 (six) hours as needed for anxiety. 03/24/16   Patrici Ranks, MD  ondansetron (ZOFRAN ODT) 8 MG disintegrating tablet Take 1 tablet (8 mg total) by mouth every 8 (eight) hours as needed for nausea or vomiting. 02/03/16   Patrici Ranks, MD  oxyCODONE (ROXICODONE) 5 MG/5ML solution Take 5-10 mLs (5-10 mg total) by mouth every 4 (four) hours as needed for severe pain. 03/24/16   Patrici Ranks, MD  pilocarpine (SALAGEN) 5 MG tablet Take 2 tablets (10 mg total) by mouth 3 (three) times daily. 02/27/16   Patrici Ranks, MD  sodium fluoride (FLUORISHIELD) 1.1 % GEL dental gel Instill one drop of gel per tooth space of fluoride tray. Place over teeth for 5 minutes. Remove. Spit out excess. Repeat nightly. 01/15/16   Patrici Ranks, MD    Family History Family History  Problem Relation Age of Onset  . Macular degeneration Mother   . Alcoholism Father     Social History Social History  Substance Use Topics  . Smoking  status: Current Every Day Smoker    Packs/day: 0.50    Types: Cigarettes  . Smokeless tobacco: Never Used  . 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   HENT: Negative.   Respiratory: Negative.   Cardiovascular: Negative.   Gastrointestinal: Positive for abdominal pain and nausea.       Feeding tube in place. Has not used feeding tube for the past 3 weeks  Musculoskeletal: Negative.   Skin: Negative.     Allergic/Immunologic: Positive for immunocompromised state.       Cancer patient  Neurological: Negative.   Psychiatric/Behavioral: Negative.   All other systems reviewed and are negative.    Physical Exam Updated Vital Signs BP 157/84   Pulse 92   Temp 97.5 F (36.4 C)   Resp 22   Ht 5\' 10"  (1.778 m)   Wt 150 lb (68 kg)   SpO2 100%   BMI 21.52 kg/m   Physical Exam  Constitutional: He appears well-developed and well-nourished. He appears distressed.  Appears uncomfortable  HENT:  Head: Normocephalic and atraumatic.  Eyes: Conjunctivae are normal. Pupils are equal, round, and reactive to light.  Neck: Neck supple. No tracheal deviation present. No thyromegaly present.  Cardiovascular: Normal rate and regular rhythm.   No murmur heard. Pulmonary/Chest: Effort normal and breath sounds normal.  Abdominal: Soft. Bowel sounds are normal. He exhibits no distension. There is tenderness.  Diffusely tender over lower quadrants reviewed feeding tube in place with insertion site at epigastrium. No redness or warmth or tenderness at epigastrium or at insertion site, with tenderness to percussion indicating peritonitis  Genitourinary: Penis normal.  Genitourinary Comments: Scrotum normal  Musculoskeletal: Normal range of motion. He exhibits no edema or tenderness.  Neurological: He is alert. Coordination normal.  Skin: Skin is warm and dry. No rash noted.  Psychiatric: He has a normal mood and affect.  Nursing note and vitals reviewed.    ED Treatments / Results  Labs (all labs ordered are listed, but only abnormal results are displayed) Labs Reviewed  COMPREHENSIVE METABOLIC PANEL  CBC WITH DIFFERENTIAL/PLATELET  LIPASE, BLOOD  URINALYSIS, ROUTINE W REFLEX MICROSCOPIC (NOT AT [facility])  POC OCCULT BLOOD, ED    EKG  EKG Interpretation None       Radiology No results found.  Procedures Procedures (including critical care time)  Medications Ordered in ED Medications   metoCLOPramide (REGLAN) injection 10 mg (not administered)  HYDROmorphone (DILAUDID) injection 2 mg (not administered)     Initial Impression / Assessment and Plan / ED Course  I have reviewed the triage vital signs and the nursing notes.  Pertinent labs & imaging results that were available during my care of the patient were reviewed by me and considered in my medical decision making (see chart for details).  Clinical Course   10:50 AM patient requesting more pain medicine after treatment with intravenous hydromorphone. He continues to appear uncomfortable. Additional hydromorphone ordered. 12:10 PM patient appears somewhat more comfortable however requesting more pain medicine. Additional intravenous hydromorphone ordered. CT scan discussed with Dr.Boles.  Consultation with Dr. Arnoldo Morale, surgeon ordered by me. Dr. Arnoldo Morale will see patient in ED after reviewing CT scan. Dr. Arnoldo Morale evaluated patient in the ED. He feels that patient cannot be operated on here as there is no anesthesiologist available in patient has history of throat cancer, meaning  potential airway problem. Patient agrees with transfer to [facility] consulted Dr. Marcello Moores, general surgeon at [facility] who accepts patient in transfer. Patient will be transferred to the emergency department. Dr.Tegeler, EDP on duty has been notified of patient's arrival to the ED. Intravenous potassium supplement and IV normal saline and IV antibiotics ordered  Results for orders placed or performed during the hospital encounter of 03/28/16  Comprehensive metabolic panel  Result Value Ref Range   Sodium 129 (L) 135 - 145 mmol/L   Potassium 3.1 (L) 3.5 - 5.1 mmol/L   Chloride 100 (L) 101 - 111 mmol/L   CO2 16 (L) 22 - 32 mmol/L   Glucose, Bld 223 (H) 65 - 99 mg/dL   BUN 11 6 - 20 mg/dL   Creatinine, Ser 0.91 0.61 - 1.24 mg/dL   Calcium 9.2 8.9 - 10.3 mg/dL   Total Protein 6.6 6.5 - 8.1 g/dL   Albumin 3.9 3.5 - 5.0  g/dL   AST 20 15 - 41 U/L   ALT 13 (L) 17 - 63 U/L   Alkaline Phosphatase 54 38 - 126 U/L   Total Bilirubin 0.6 0.3 - 1.2 mg/dL   GFR calc non Af Amer >60 >60 mL/min   GFR calc Af Amer >60 >60 mL/min   Anion gap 13 5 - 15  CBC with Differential/Platelet  Result Value Ref Range   WBC 7.7 4.0 - 10.5 K/uL   RBC 4.55 4.22 - 5.81 MIL/uL   Hemoglobin 13.5 13.0 - 17.0 g/dL   HCT 39.5 39.0 - 52.0 %   MCV 86.8 78.0 - 100.0 fL   MCH 29.7 26.0 - 34.0 pg   MCHC 34.2 30.0 - 36.0 g/dL   RDW 13.8 11.5 - 15.5 %   Platelets 203 150 - 400 K/uL   Neutrophils Relative % 83 %   Neutro Abs 6.4 1.7 - 7.7 K/uL   Lymphocytes Relative 11 %   Lymphs Abs 0.8 0.7 - 4.0 K/uL   Monocytes Relative 5 %   Monocytes Absolute 0.4 0.1 - 1.0 K/uL   Eosinophils Relative 1 %   Eosinophils Absolute 0.1 0.0 - 0.7 K/uL   Basophils Relative 1 %   Basophils Absolute 0.1 0.0 - 0.1 K/uL   WBC Morphology WHITE COUNT CONFIRMED ON SMEAR   Lipase, blood  Result Value Ref Range   Lipase 39 11 - 51 U/L   Ct Abdomen Pelvis W Contrast  Result Date: 03/28/2016 CLINICAL DATA:  Lower abdominal pain since 7 a.m. 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 Sagittal and coronal MPR images reconstructed from axial data set. CONTRAST:  1 ISOVUE-300 IOPAMIDOL (ISOVUE-300) INJECTION 61%, 169mL ISOVUE-300 IOPAMIDOL (ISOVUE-300) INJECTION 61% COMPARISON:  None FINDINGS: Lower chest: Lung bases clear Hepatobiliary: Calcified gallstones in gallbladder. Nodular cirrhotic appearing liver without focal hepatic mass. No biliary dilatation. Pancreas: Normal appearance Spleen: Normal appearance, small in size.  Adrenals/Urinary Tract: Adrenal glands and kidneys normal appearance. No hydronephrosis or ureteral dilatation. Bladder unremarkable. Stomach/Bowel: Diffuse colonic diverticulosis. Gastrostomy tube in stomach. Diffuse proximal small bowel loops in the LEFT upper quadrant demonstrate mild wall thickening and hyperemia within the mesenteries question enteritis. Minimal scattered colonic wall thickening distally. Focal gas  collection RIGHT lateral to the urinary bladder, suspect extraluminal gas, does not clearly communicate with bowel, overall measuring 4.5 x 2.7 x 2.8 cm. Appendix upper normal caliber with multiple small diverticula. Vascular/Lymphatic: Atherosclerotic calcifications aorta without aneurysm. Coronary arterial calcification. No adenopathy. Reproductive: N/A Other: Free intraperitoneal air and fluid compatible with perforated viscus. No definite hernia. Musculoskeletal: Degenerative disc disease changes lower lumbar spine. No focal osseous lesions. IMPRESSION: Cirrhotic liver. Cholelithiasis. Free intraperitoneal air and fluid of uncertain etiology ; patient has thickened small bowel loops with hyperemia in the LEFT upper quadrant question small bowel enteritis and diffuse colonic diverticulosis, either of which could be related to potential perforation. The presence of a focal extraluminal gas collection adjacent to the urinary bladder and more free air/extraluminal gas inferiorly slightly favors potential colonic perforation. No specific changes seen adjacent to the gastrostomy tube to suggest that a dislodged gastrostomy tube is the source of the observed free air. Aortic atherosclerosis. Critical Value/emergent results were called by telephone at the time of interpretation on 03/28/2016 at 12:04 pm to Dr. Orlie Dakin , who verbally acknowledged these results. Electronically Signed   By: Lavonia Dana M.D.   On: 03/28/2016 12:05    Final Clinical Impressions(s) / ED Diagnoses  Diagnoses #1 acute  pneumoperitoneum with peritonitis #2 hypokalemia #3 hyponatremia  Final diagnoses:  None   CRITICAL CARE Performed by: Orlie Dakin Total critical care time: 60 minutes Critical care time was exclusive of separately billable procedures and treating other patients. Critical care was necessary to treat or prevent imminent or life-threatening deterioration. Critical care was time spent personally by me on the following activities: development of treatment plan with patient and/or surrogate as well as nursing, discussions with consultants, evaluation of patient's response to treatment, examination of patient, obtaining history from patient or surrogate, ordering and performing treatments and interventions, ordering and review of laboratory studies, ordering and review of radiographic studies, pulse oximetry and re-evaluation of patient's condition. New Prescriptions New Prescriptions   No medications on file     Orlie Dakin, MD 03/28/16 1322

## 2016-03-28 NOTE — Consult Note (Signed)
Reason for Consult: Peritonitis, perforated viscus Referring Physician: ER, Dr. Birdie Riddle Koren is an 65 y.o. male.  HPI: Patient is a 65 year old white male with a history of stage IV left tonsillar carcinoma and history of alcoholic cirrhosis who presented to the emergency room with the sudden onset of lower abdominal pain. This was his first episode. He has never had pain like this before. Any movement causes severe abdominal pain. He is nauseated, but no vomiting noted. He denies any fevers. Pain is 10 out of 10. He is comfortable when lying still. He states that he did not have a colonoscopy due to his recent diagnosis of tonsillar carcinoma. Denies any blood per rectum. His last bowel movement was several days ago. No blood was noted.  Past Medical History:  Diagnosis Date  . Cirrhosis ([place])   . Hypertension   . Portal hypertension ([place])   . Tonsillar mass    left    Past Surgical History:  Procedure Laterality Date  . HERNIA REPAIR Right   . MOUTH SURGERY    . TONSILLECTOMY Left 07/09/2015   Procedure: LEFT TONSILLECTOMY;  Surgeon: Leta Baptist, MD;  Location: [facility];  Service: ENT;  Laterality: Left;    Family History  Problem Relation Age of Onset  . Macular degeneration Mother   . Alcoholism Father     Social History:  reports that he has been smoking Cigarettes.  He has been smoking about 0.50 packs per day. He has never used smokeless tobacco. He reports that he does not drink alcohol or use drugs.  Allergies:  Allergies  Allergen Reactions  . Bee Venom Swelling  . Other     Opthalmic mycin irritates eyes.     Medications: Prior to Admission:  (Not in a hospital admission) Scheduled:   Results for orders placed or performed during the hospital encounter of 03/28/16 (from the past 48 hour(s))  Comprehensive metabolic panel     Status: Abnormal   Collection Time: 03/28/16  8:24 AM  Result Value Ref Range   Sodium 129 (L) 135 - 145  mmol/L   Potassium 3.1 (L) 3.5 - 5.1 mmol/L   Chloride 100 (L) 101 - 111 mmol/L   CO2 16 (L) 22 - 32 mmol/L   Glucose, Bld 223 (H) 65 - 99 mg/dL   BUN 11 6 - 20 mg/dL   Creatinine, Ser 0.91 0.61 - 1.24 mg/dL   Calcium 9.2 8.9 - 10.3 mg/dL   Total Protein 6.6 6.5 - 8.1 g/dL   Albumin 3.9 3.5 - 5.0 g/dL   AST 20 15 - 41 U/L   ALT 13 (L) 17 - 63 U/L   Alkaline Phosphatase 54 38 - 126 U/L   Total Bilirubin 0.6 0.3 - 1.2 mg/dL   GFR calc non Af Amer >60 >60 mL/min   GFR calc Af Amer >60 >60 mL/min    Comment: (NOTE) The eGFR has been calculated using the CKD EPI equation. This calculation has not been validated in all clinical situations. eGFR's persistently <60 mL/min signify possible Chronic Kidney Disease.    Anion gap 13 5 - 15  CBC with Differential/Platelet     Status: None   Collection Time: 03/28/16  8:24 AM  Result Value Ref Range   WBC 7.7 4.0 - 10.5 K/uL   RBC 4.55 4.22 - 5.81 MIL/uL   Hemoglobin 13.5 13.0 - 17.0 g/dL   HCT 39.5 39.0 - 52.0 %   MCV 86.8 78.0 - 100.0  fL   MCH 29.7 26.0 - 34.0 pg   MCHC 34.2 30.0 - 36.0 g/dL   RDW 13.8 11.5 - 15.5 %   Platelets 203 150 - 400 K/uL   Neutrophils Relative % 83 %   Neutro Abs 6.4 1.7 - 7.7 K/uL   Lymphocytes Relative 11 %   Lymphs Abs 0.8 0.7 - 4.0 K/uL   Monocytes Relative 5 %   Monocytes Absolute 0.4 0.1 - 1.0 K/uL   Eosinophils Relative 1 %   Eosinophils Absolute 0.1 0.0 - 0.7 K/uL   Basophils Relative 1 %   Basophils Absolute 0.1 0.0 - 0.1 K/uL   WBC Morphology WHITE COUNT CONFIRMED ON SMEAR   Lipase, blood     Status: None   Collection Time: 03/28/16  8:24 AM  Result Value Ref Range   Lipase 39 11 - 51 U/L    Ct Abdomen Pelvis W Contrast  Result Date: 03/28/2016 CLINICAL DATA:  Lower abdominal pain since 7 a.m. 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 Sagittal and coronal MPR images reconstructed from axial data set. CONTRAST:  1 ISOVUE-300 IOPAMIDOL (ISOVUE-300) INJECTION 61%, 159m ISOVUE-300 IOPAMIDOL (ISOVUE-300) INJECTION 61% COMPARISON:  None FINDINGS: Lower chest: Lung bases clear Hepatobiliary: Calcified gallstones in gallbladder. Nodular cirrhotic appearing liver without focal hepatic mass. No biliary dilatation. Pancreas: Normal appearance Spleen: Normal appearance, small in size. Adrenals/Urinary Tract: Adrenal glands and kidneys normal appearance. No hydronephrosis or ureteral dilatation. Bladder unremarkable. Stomach/Bowel: Diffuse colonic diverticulosis. Gastrostomy tube in stomach. Diffuse proximal small bowel loops in the LEFT upper quadrant demonstrate mild wall thickening and hyperemia within the mesenteries question enteritis. Minimal scattered colonic wall thickening distally. Focal gas collection RIGHT lateral to the urinary bladder, suspect extraluminal gas, does not clearly communicate with bowel, overall measuring 4.5 x 2.7 x 2.8 cm. Appendix upper normal caliber with multiple small diverticula. Vascular/Lymphatic: Atherosclerotic calcifications aorta without aneurysm. Coronary arterial calcification. No adenopathy. Reproductive: N/A Other: Free intraperitoneal air and fluid compatible with perforated viscus. No definite hernia. Musculoskeletal: Degenerative disc disease changes lower lumbar spine. No focal osseous lesions. IMPRESSION: Cirrhotic liver. Cholelithiasis. Free intraperitoneal air and fluid of uncertain etiology ; patient has thickened small bowel loops with hyperemia in the LEFT upper quadrant question small bowel enteritis and diffuse colonic diverticulosis, either of which could be related to potential perforation. The presence of a  focal extraluminal gas collection adjacent to the urinary bladder and more free air/extraluminal gas inferiorly slightly favors potential colonic perforation. No specific changes seen adjacent to the gastrostomy tube to suggest that a dislodged gastrostomy tube is the source of the observed free air. Aortic atherosclerosis. Critical Value/emergent results were called by telephone at the time of interpretation on 03/28/2016 at 12:04 pm to Dr. SOrlie Dakin, who verbally acknowledged these results. Electronically Signed   By: MLavonia DanaM.D.   On: 03/28/2016 12:05    ROS:  Pertinent items noted in HPI and remainder of comprehensive ROS otherwise negative.  Blood pressure 145/87, pulse 120, temperature 97.5 F (36.4 C), resp. rate 16, height _0  (1.778 m), weight 68 kg (150 lb), SpO2 93 %. Physical Exam: Pleasant well-developed well-nourished white male Head is normocephalic, atraumatic. Neck examination reveals midline  trachea. Lungs clear to auscultation with breath sounds bilaterally. Heart examination reveals a tachycardic rate. No S3, S4, murmurs noted. Abdomen is flat but diffusely tender with rigidity noted. I could not palpate the liver edge due to pain. G-tube in place and appears to be an appropriate position. No drainage noted around the G-tube. CT scan personally reviewed by myself.  Assessment/Plan: Impression: Perforated viscus with peritoneal signs, probable perforated colon, stage IV left tonsillar carcinoma, history of alcoholic cirrhosis Patient does need exploratory laparotomy with possible colostomy formation due to the perforated viscus and peritoneal signs. Given the fact that he has had tonsillar carcinoma with radiation therapy to the neck and I do not have an anesthesiologist available for induction, patient needs to be referred to a tertiary care center for further and evaluation treatment. I have explained this to the patient, who understands and agrees. Discussed with  Dr. Gershon Mussel A 03/28/2016, 12:41 PM

## 2016-03-28 NOTE — ED Notes (Signed)
[facility] arrived to transport patient.  

## 2016-03-29 LAB — CBC
HCT: 35.2 % — ABNORMAL LOW (ref 39.0–52.0)
Hemoglobin: 11.7 g/dL — ABNORMAL LOW (ref 13.0–17.0)
MCH: 29.6 pg (ref 26.0–34.0)
MCHC: 33.2 g/dL (ref 30.0–36.0)
MCV: 89.1 fL (ref 78.0–100.0)
Platelets: 150 10*3/uL (ref 150–400)
RBC: 3.95 MIL/uL — ABNORMAL LOW (ref 4.22–5.81)
RDW: 14.6 % (ref 11.5–15.5)
WBC: 4.3 10*3/uL (ref 4.0–10.5)

## 2016-03-29 LAB — BASIC METABOLIC PANEL WITH GFR
Anion gap: 7 (ref 5–15)
BUN: 16 mg/dL (ref 6–20)
CO2: 16 mmol/L — ABNORMAL LOW (ref 22–32)
Calcium: 7.3 mg/dL — ABNORMAL LOW (ref 8.9–10.3)
Chloride: 114 mmol/L — ABNORMAL HIGH (ref 101–111)
Creatinine, Ser: 0.91 mg/dL (ref 0.61–1.24)
GFR calc Af Amer: >60 mL/min
GFR calc non Af Amer: >60 mL/min
Glucose, Bld: 70 mg/dL (ref 65–99)
Potassium: 4.9 mmol/L (ref 3.5–5.1)
Sodium: 137 mmol/L (ref 135–145)

## 2016-03-29 MED ORDER — SALINE SPRAY 0.65 % NA SOLN
1.0000 | NASAL | Status: DC | PRN
  Administered 2016-03-29: 1 via NASAL
  Filled 2016-03-29: qty 44

## 2016-03-29 MED ORDER — DEXTROSE 5 % IV SOLN
2.0000 g | INTRAVENOUS | Status: DC
  Administered 2016-03-29 – 2016-04-07 (×10): 2 g via INTRAVENOUS
  Filled 2016-03-29 (×12): qty 2

## 2016-03-29 MED ORDER — MENTHOL 3 MG MT LOZG
1.0000 | LOZENGE | OROMUCOSAL | Status: DC | PRN
  Filled 2016-03-29: qty 9

## 2016-03-29 NOTE — Progress Notes (Signed)
1 Day Post-Op Hartman's procedure Subjective: Hypotensive overnight, pain tolerable with fentanyl PCA  Objective: Vital signs in last 24 hours: Temp:  [97.5 F (36.4 C)-98.9 F (37.2 C)] 98.9 F (37.2 C) (11/05 0400) Pulse Rate:  [98-120] 109 (11/04 1845) Resp:  [10-22] 10 (11/05 0900) BP: (61-145)/(40-94) 92/58 (11/05 0900) SpO2:  [93 %-100 %] 97 % (11/05 0900)   Intake/Output from previous day: 11/04 0701 - 11/05 0700 In: 2460.4 [I.V.:1510.4; IV Piggyback:950] Out: 2380 [Urine:1105; Drains:275; Blood:300] Intake/Output this shift: Total I/O In: 250 [I.V.:250] Out: 150 [Urine:150]   General appearance: alert and cooperative GI: soft, appropriately tender Ostomy: edematous Incision: no significant drainage  Lab Results:   Recent Labs  03/28/16 0824 03/29/16 0700  WBC 7.7 4.3  HGB 13.5 11.7*  HCT 39.5 35.2*  PLT 203 150   BMET  Recent Labs  03/28/16 0824  NA 129*  K 3.1*  CL 100*  CO2 16*  GLUCOSE 223*  BUN 11  CREATININE 0.91  CALCIUM 9.2   PT/INR  Recent Labs  03/28/16 0824  LABPROT 12.7  INR 0.95   ABG No results for input(s): PHART, HCO3 in the last 72 hours.  Invalid input(s): PCO2, PO2  MEDS, Scheduled . acetaminophen  1,000 mg Oral Q6H  . cefTRIAXone (ROCEPHIN)  IV  2 g Intravenous Q24H  . enoxaparin (LOVENOX) injection  40 mg Subcutaneous Q24H  . escitalopram  20 mg Oral Daily  . fentaNYL  12.5 mcg Transdermal Q3 days  . fentaNYL   Intravenous Q4H  . loratadine  10 mg Oral Daily  . mouth rinse  15 mL Mouth Rinse BID  . metronidazole  500 mg Intravenous Q8H  . pilocarpine  5 mg Oral TID    Studies/Results: Ct Abdomen Pelvis W Contrast  Result Date: 03/28/2016 CLINICAL DATA:  Lower abdominal pain since 7 a.m. shortly after a bowel movement, feeding tube placed in March 2017 due to difficulty swallowing following radiation therapy for squamous cell carcinoma of the tongue, hypertension, cirrhosis EXAM: CT ABDOMEN AND PELVIS WITH  CONTRAST TECHNIQUE: Multidetector CT imaging of the abdomen and pelvis was performed using the standard protocol following bolus administration of intravenous contrast. Sagittal and coronal MPR images reconstructed from axial data set. CONTRAST:  1 ISOVUE-300 IOPAMIDOL (ISOVUE-300) INJECTION 61%, 159mL ISOVUE-300 IOPAMIDOL (ISOVUE-300) INJECTION 61% COMPARISON:  None FINDINGS: Lower chest: Lung bases clear Hepatobiliary: Calcified gallstones in gallbladder. Nodular cirrhotic appearing liver without focal hepatic mass. No biliary dilatation. Pancreas: Normal appearance Spleen: Normal appearance, small in size. Adrenals/Urinary Tract: Adrenal glands and kidneys normal appearance. No hydronephrosis or ureteral dilatation. Bladder unremarkable. Stomach/Bowel: Diffuse colonic diverticulosis. Gastrostomy tube in stomach. Diffuse proximal small bowel loops in the LEFT upper quadrant demonstrate mild wall thickening and hyperemia within the mesenteries question enteritis. Minimal scattered colonic wall thickening distally. Focal gas collection RIGHT lateral to the urinary bladder, suspect extraluminal gas, does not clearly communicate with bowel, overall measuring 4.5 x 2.7 x 2.8 cm. Appendix upper normal caliber with multiple small diverticula. Vascular/Lymphatic: Atherosclerotic calcifications aorta without aneurysm. Coronary arterial calcification. No adenopathy. Reproductive: N/A Other: Free intraperitoneal air and fluid compatible with perforated viscus. No definite hernia. Musculoskeletal: Degenerative disc disease changes lower lumbar spine. No focal osseous lesions. IMPRESSION: Cirrhotic liver. Cholelithiasis. Free intraperitoneal air and fluid of uncertain etiology ; patient has thickened small bowel loops with hyperemia in the LEFT upper quadrant question small bowel enteritis and diffuse colonic diverticulosis, either of which could be related to potential perforation. The presence of  a focal extraluminal gas  collection adjacent to the urinary bladder and more free air/extraluminal gas inferiorly slightly favors potential colonic perforation. No specific changes seen adjacent to the gastrostomy tube to suggest that a dislodged gastrostomy tube is the source of the observed free air. Aortic atherosclerosis. Critical Value/emergent results were called by telephone at the time of interpretation on 03/28/2016 at 12:04 pm to Dr. Orlie Dakin , who verbally acknowledged these results. Electronically Signed   By: Lavonia Dana M.D.   On: 03/28/2016 12:05    Assessment: s/p Procedure(s): EXPLORATORY LAPAROTOMY, SIGMOID COLECTOMY, COLOSTOMY Patient Active Problem List   Diagnosis Date Noted  . Perforated sigmoid colon ([place]) 03/28/2016  . Reactive depression (situational) 02/29/2016  . 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 Tobacco use disorder 02/27/2016  . Tinnitus 02/27/2016  . Dysuria 10/25/2015  . Decreased oral intake 10/25/2015  . Malnutrition of moderate degree 10/19/2015  . Antineoplastic chemotherapy induced pancytopenia ([place]) 10/18/2015  . Fever   . Candidiasis of mouth and esophagus ([place]) 10/17/2015  . Neutropenia, febrile ([place]) 10/17/2015  . Shingles rash 10/17/2015  . Nausea with vomiting 09/20/2015  . Hyponatremia 09/20/2015  . Oral pharyngeal candidiasis 09/20/2015  . Antineoplastic chemotherapy induced anemia 09/20/2015  . Hypertension   . Cirrhosis ([place])   . Portal hypertension ([place])   . Cancer of tonsillar fossa ([place]) 08/02/2015    Expected post op course  Plan: COnt IVF's  Cont foley Transfer to SDU Will cont to watch BP today, bolus for low UOP Cont PCA Cont IV antibiotics   LOS: 1 day     .Rosario Adie, MD [facility] Surgery, [place]   03/29/2016 10:23 AM

## 2016-03-29 NOTE — Plan of Care (Signed)
Problem: Pain Managment: Goal: General experience of comfort will improve Outcome: Not Progressing Pt still c/o 10/10 pain although using PCA.

## 2016-03-30 ENCOUNTER — Encounter (HOSPITAL_COMMUNITY): Payer: Self-pay | Admitting: General Surgery

## 2016-03-30 LAB — CBC
HCT: 28.9 % — ABNORMAL LOW (ref 39.0–52.0)
HEMOGLOBIN: 9.8 g/dL — AB (ref 13.0–17.0)
MCH: 29.9 pg (ref 26.0–34.0)
MCHC: 33.9 g/dL (ref 30.0–36.0)
MCV: 88.1 fL (ref 78.0–100.0)
Platelets: 122 10*3/uL — ABNORMAL LOW (ref 150–400)
RBC: 3.28 MIL/uL — AB (ref 4.22–5.81)
RDW: 14.8 % (ref 11.5–15.5)
WBC: 8.8 10*3/uL (ref 4.0–10.5)

## 2016-03-30 LAB — BASIC METABOLIC PANEL
ANION GAP: 5 (ref 5–15)
BUN: 15 mg/dL (ref 6–20)
CALCIUM: 7.9 mg/dL — AB (ref 8.9–10.3)
CO2: 21 mmol/L — AB (ref 22–32)
Chloride: 112 mmol/L — ABNORMAL HIGH (ref 101–111)
Creatinine, Ser: 0.76 mg/dL (ref 0.61–1.24)
Glucose, Bld: 77 mg/dL (ref 65–99)
Potassium: 3.8 mmol/L (ref 3.5–5.1)
Sodium: 138 mmol/L (ref 135–145)

## 2016-03-30 MED ORDER — LORAZEPAM 0.5 MG PO TABS
0.5000 mg | ORAL_TABLET | ORAL | Status: DC | PRN
  Administered 2016-03-30 – 2016-04-08 (×11): 0.5 mg via ORAL
  Filled 2016-03-30 (×11): qty 1

## 2016-03-30 MED ORDER — LIP MEDEX EX OINT
TOPICAL_OINTMENT | CUTANEOUS | Status: DC | PRN
  Filled 2016-03-30: qty 7

## 2016-03-30 NOTE — Progress Notes (Signed)
Initial Nutrition Assessment  DOCUMENTATION CODES:   Not applicable  INTERVENTION:  - Diet advancement as medically feasible. - RD will follow-up 11/8.  NUTRITION DIAGNOSIS:   Inadequate oral intake related to inability to eat as evidenced by NPO status.  GOAL:   Patient will meet greater than or equal to 90% of their needs  MONITOR:   Diet advancement, Weight trends, Labs, Skin, I & O's  REASON FOR ASSESSMENT:   Malnutrition Screening Tool  ASSESSMENT:   65 y.o. M with a h/o liver cirrhosis and tonsilar cancer, s/p XRT completed in May 17. Reports several days of intermittent nausea followed by severe, acute onset, abdominal pain this am. Denies any fevers. Reported to [facility] ED and was found to have diffuse peritonitis and free air on CT scan. Was transferred here due to h/o radiation to the neck and no anesthesiologist on call at [facility]. Pt takes chronic narcotics and has been weaning off these recently and increasing his ibuprofen use.  Pt seen for MST. BMI indicates normal weight. Pt has been NPO since admission. He is POD #2 ex lap with sigmoid colectomy and end colostomy. Pt taking sips of water throughout the day today and is very eager for diet advancement. Pt reports last radiation treatment was in May 2017. He has a PEG and was previously using 6-8 cans of Jevity 1.2/day but has not used PEG in the past 1 month as PO intakes have improved/increased. He was consuming a wide variety of foods/food groups PTA and had been working with SLP and RD at [facility]. Oral nutrition supplements included both Ensure and Boost. He reports that he sometimes experiences coughing and pain with PO intakes but that since last radiation treatment this has gradually improved.   Pt states that his weight over the past 1 month has remained stable at ~150 lbs. He reports that max weight over the past 1 month was 154 lbs and at that time he was informed that if he could maintain this higher  weight then PEG could be removed. Per chart review, weight has been stable (150-154 lbs) over the past 4 months. No muscle or fat wasting noted during physical assessment.   Pt denies any abdominal pain or nausea at time of RD visit; noted that pt reported nausea this AM. He states that he is very hungry and requests diet advancement as soon as is medically feasible.    Medications reviewed; PRN Zofran. Labs reviewed; Cl: 112 mmol/L, Ca: 7.9 mg/dL.  IVF: NS @ 125 mL/hr.    Diet Order:  Diet NPO time specified Except for: Ice Chips, Sips with Meds  Skin:  Wound (see comment) (Abdominal incision from 03/28/16)  Last BM:  PTA/unknown  Height:   Ht Readings from Last 1 Encounters:  03/28/16 5\' 10"  (1.778 m)    Weight:   Wt Readings from Last 1 Encounters:  03/28/16 150 lb (68 kg)    Ideal Body Weight:  75.45 kg  BMI:  Body mass index is 21.52 kg/m.  Estimated Nutritional Needs:   Kcal:  [id number] (30-33 kcal/kg)  Protein:  82-95 grams (1.2-1.5 grams/kg)  Fluid:  >/= 2 L/day  EDUCATION NEEDS:   No education needs identified at this time    Jarome Matin, MS, RD, LDN Inpatient Clinical Dietitian Pager # [phone] After hours/weekend pager # [phone]

## 2016-03-30 NOTE — Consult Note (Signed)
[place] Nurse ostomy consult note Stoma type/location: LLQ end colostomy (Dr. Marcello Moores, 11/4) Stomal assessment/size: 2 inches round, red, moist, edematous, raised.  Os at center. Peristomal assessment: intact, clear.  Medial edge is close to open incision. Treatment options for stomal/peristomal skin: skin barrier ring Output: serosanguinous effluent in a small amount.  No flatus Ostomy pouching: 2pc., 2 and 3/4 inch flat with skin barrier ring.Due to proximity of stoma to midline wound alteration in skin barrier preparation is required (I cut off-center).   Education provided: Patient and sister (who is a Tourist information centre manager) participated in session; patient is still in ICU and while he admits to session decreasing his anxiety of the unknown, he is not able to fully participate.  He is taught basic GI A&P, stoma and pouch characteristics, rationale behind ostomy improvements in last decade.  He is shown the [employer] closure, but does not attempt today.  I will continue teaching sessions as he improves physically and hemodynamically.  Enrolled patient in [facility] program: No WOC nursing team will follow, and will remain available to this patient, the nursing, surgical and medical teams.  Thanks, Maudie Flakes, MSN, RN, [place], Arther Abbott  Pager# [phone]

## 2016-03-30 NOTE — Progress Notes (Signed)
2 Days Post-Op  Subjective: Complains of pain. He was having a lot of pain prior to surgery. Also complains of nausea and some leaking around foley  Objective: Vital signs in last 24 hours: Temp:  [97.5 F (36.4 C)-98.5 F (36.9 C)] 98.2 F (36.8 C) (11/06 0800) Resp:  [15-25] 25 (11/06 0900) BP: (112-166)/(64-91) 166/91 (11/06 0900) SpO2:  [97 %-100 %] 99 % (11/06 0900) Last BM Date:  (prior to admission)  Intake/Output from previous day: 11/05 0701 - 11/06 0700 In: 3255 [P.O.:30; I.V.:2875; IV Piggyback:350] Out: 1480 [Urine:1400; Drains:40; Stool:40] Intake/Output this shift: Total I/O In: 273.2 [I.V.:273.2] Out: 225 [Urine:225]  Resp: clear to auscultation bilaterally Cardio: regular rate and rhythm GI: soft, mild to mod tenderness. wound clean. ostomy pink with little output. g tube clamped  Lab Results:   Recent Labs  03/29/16 0700 03/30/16 0511  WBC 4.3 8.8  HGB 11.7* 9.8*  HCT 35.2* 28.9*  PLT 150 122*   BMET  Recent Labs  03/29/16 0700 03/30/16 0511  NA 137 138  K 4.9 3.8  CL 114* 112*  CO2 16* 21*  GLUCOSE 70 77  BUN 16 15  CREATININE 0.91 0.76  CALCIUM 7.3* 7.9*   PT/INR  Recent Labs  03/28/16 0824  LABPROT 12.7  INR 0.95   ABG No results for input(s): PHART, HCO3 in the last 72 hours.  Invalid input(s): PCO2, PO2  Studies/Results: Ct Abdomen Pelvis W Contrast  Result Date: 03/28/2016 CLINICAL DATA:  Lower abdominal pain since 7 a.m. 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 Sagittal and coronal MPR images reconstructed from axial data set. CONTRAST:  1 ISOVUE-300 IOPAMIDOL (ISOVUE-300)  INJECTION 61%, 124mL ISOVUE-300 IOPAMIDOL (ISOVUE-300) INJECTION 61% COMPARISON:  None FINDINGS: Lower chest: Lung bases clear Hepatobiliary: Calcified gallstones in gallbladder. Nodular cirrhotic appearing liver without focal hepatic mass. No biliary dilatation. Pancreas: Normal appearance Spleen: Normal appearance, small in size. Adrenals/Urinary Tract: Adrenal glands and kidneys normal appearance. No hydronephrosis or ureteral dilatation. Bladder unremarkable. Stomach/Bowel: Diffuse colonic diverticulosis. Gastrostomy tube in stomach. Diffuse proximal small bowel loops in the LEFT upper quadrant demonstrate mild wall thickening and hyperemia within the mesenteries question enteritis. Minimal scattered colonic wall thickening distally. Focal gas collection RIGHT lateral to the urinary bladder, suspect extraluminal gas, does not clearly communicate with bowel, overall measuring 4.5 x 2.7 x 2.8 cm. Appendix upper normal caliber with multiple small diverticula. Vascular/Lymphatic: Atherosclerotic calcifications aorta without aneurysm. Coronary arterial calcification. No adenopathy. Reproductive: N/A Other: Free intraperitoneal air and fluid compatible with perforated viscus. No definite hernia. Musculoskeletal: Degenerative disc disease changes lower lumbar spine. No focal osseous lesions. IMPRESSION: Cirrhotic liver. Cholelithiasis. Free intraperitoneal air and fluid of uncertain etiology ; patient has thickened small bowel loops with hyperemia in the LEFT upper quadrant question small bowel enteritis and diffuse colonic diverticulosis, either of which could be related to potential perforation. The presence of a focal extraluminal gas collection adjacent to the urinary bladder and more free air/extraluminal gas inferiorly slightly favors potential colonic perforation. No specific changes seen adjacent to the gastrostomy tube to suggest that a dislodged gastrostomy tube is the source of the observed free air. Aortic  atherosclerosis. Critical Value/emergent results were called by telephone at the  time of interpretation on 03/28/2016 at 12:04 pm to Dr. Orlie Dakin , who verbally acknowledged these results. Electronically Signed   By: Lavonia Dana M.D.   On: 03/28/2016 12:05    Anti-infectives: Anti-infectives    Start     Dose/Rate Route Frequency Ordered Stop   03/29/16 2000  cefTRIAXone (ROCEPHIN) 2 g in dextrose 5 % 50 mL IVPB     2 g 100 mL/hr over 30 Minutes Intravenous Every 24 hours 03/29/16 0853     03/28/16 2000  cefTRIAXone (ROCEPHIN) 1 g in dextrose 5 % 50 mL IVPB  Status:  Discontinued     1 g 100 mL/hr over 30 Minutes Intravenous Every 24 hours 03/28/16 1948 03/29/16 0853   03/28/16 2000  metroNIDAZOLE (FLAGYL) IVPB 500 mg     500 mg 100 mL/hr over 60 Minutes Intravenous Every 8 hours 03/28/16 1948     03/28/16 1215  ceFEPIme (MAXIPIME) 2 g in dextrose 5 % 50 mL IVPB     2 g 100 mL/hr over 30 Minutes Intravenous  Once 03/28/16 1213 03/28/16 1259   03/28/16 1215  metroNIDAZOLE (FLAGYL) IVPB 500 mg     500 mg 100 mL/hr over 60 Minutes Intravenous  Once 03/28/16 1213 03/28/16 1500      Assessment/Plan: s/p Procedure(s): EXPLORATORY LAPAROTOMY, SIGMOID COLECTOMY, COLOSTOMY (N/A) unclamp g tube and check residual given his nausea  Continue rocephin and flagyl for perforated colon Start dressing changes Continue to monitor in icu Continue foley  LOS: 2 days    TOTH III,Samaria Anes S 03/30/2016

## 2016-03-30 NOTE — Progress Notes (Signed)
Attempting to get OOB, slightly confused. Reorients easily.

## 2016-03-30 NOTE — Care Management Note (Signed)
Case Management Note  Patient Details  Name: Frank Castillo MRN: [id number] Date of Birth: 1950/10/31  Subjective/Objective:       Bowel perforation with hypokalemia and hyponatremia, hypotensive s/p surg.             Action/Plan: home when stable  Date:  March 30, 2016 Chart reviewed for concurrent status and case management needs. Will continue to follow the patient for status change: Discharge Planning: following for needs Expected discharge date: [id number] Velva Harman, BSN, [place], [place]  Expected Discharge Date:                  Expected Discharge Plan:  Home/Self Care  In-House Referral:     Discharge planning Services     Post Acute Care Choice:    Choice offered to:     DME Arranged:    DME Agency:     HH Arranged:    [place] Agency:     Status of Service:  In process, will continue to follow  If discussed at Long Length of Stay Meetings, dates discussed:    Additional Comments:  Leeroy Cha, RN 03/30/2016, 9:48 AM

## 2016-03-30 NOTE — Evaluation (Signed)
Physical Therapy Evaluation Patient Details Name: Frank Castillo MRN: [id number] DOB: March 24, 1951 Today's Date: 03/30/2016   History of Present Illness  65 yo male admitted with perforated sigmoid colon. 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 Hx of liver cirrhosis, tonsillar cancer.   Clinical Impression  On eval, pt required Mod assist +2 safety for mobility. He walked ~25 feet with a RW. Pt continues to report moderate pain in abdomen. Pt lives alone and will need to be as independent as possible before returning home. Will follow and progress activity as able.     Follow Up Recommendations Home health PT;Supervision/Assistance - 24 hour (may progress to not needing 24 hour care)    Equipment Recommendations   (continuing to assess)    Recommendations for Other Services       Precautions / Restrictions Precautions Precautions: Fall Precaution Comments: multiple lines/leads, colostomy, jejunostomy Restrictions Weight Bearing Restrictions: No      Mobility  Bed Mobility Overal bed mobility: Needs Assistance Bed Mobility: Rolling;Sidelying to Sit;Sit to Supine Rolling: Min guard Sidelying to sit: Mod assist; +2 safety/equipment   Sit to supine: Mod assist; +2 safety/equipment   General bed mobility comments: Assist for trunk and bil LEs. Increased time. VCs safety, technique. Pt c/o lightheadedness  Transfers Overall transfer level: Needs assistance Equipment used: Rolling walker (2 wheeled) Transfers: Sit to/from Stand Sit to Stand: Min assist;+2 physical assistance;+2 safety/equipment         General transfer comment: Assist to rise, stabilize, control descent. VCs safety. Pt c/o lightheadedness  Ambulation/Gait Ambulation/Gait assistance: Min assist;+2 physical assistance;+2 safety/equipment Ambulation Distance (Feet): 25 Feet Assistive device: Rolling walker (2 wheeled) Gait Pattern/deviations: Step-through pattern;Decreased stride length      General Gait Details: Assist to stabilize. Followed closely with recliner.   Stairs            Wheelchair Mobility    Modified Rankin (Stroke Patients Only)       Balance                                             Pertinent Vitals/Pain Pain Assessment: Faces Faces Pain Scale: Hurts whole lot Pain Location: abdomen Pain Descriptors / Indicators: Sore;Grimacing;Guarding Pain Intervention(s): Limited activity within patient's tolerance;Repositioned    Home Living Family/patient expects to be discharged to:: Private residence Living Arrangements: Spouse/significant other Available Help at Discharge: Family Type of Home: House Home Access: Stairs to enter   Technical brewer of Steps: 2 Home Layout: One level Home Equipment: None      Prior Function Level of Independence: Independent               Hand Dominance        Extremity/Trunk Assessment   Upper Extremity Assessment: Generalized weakness           Lower Extremity Assessment: Generalized weakness      Cervical / Trunk Assessment: Normal  Communication   Communication: No difficulties  Cognition Arousal/Alertness: Awake/alert Behavior During Therapy: WFL for tasks assessed/performed Overall Cognitive Status: Within Functional Limits for tasks assessed                      General Comments      Exercises     Assessment/Plan    PT Assessment Patient needs continued PT services  PT Problem List Decreased strength;Decreased mobility;Decreased balance;Decreased activity tolerance;Decreased  knowledge of use of DME;Pain          PT Treatment Interventions DME instruction;Gait training;Therapeutic activities;Therapeutic exercise;Patient/family education;Functional mobility training;Balance training    PT Goals (Current goals can be found in the Care Plan section)  Acute Rehab PT Goals Patient Stated Goal: less pain. regain independence PT Goal  Formulation: With patient/family Time For Goal Achievement: 04/13/16 Potential to Achieve Goals: Good    Frequency Min 3X/week   Barriers to discharge        Co-evaluation               End of Session   Activity Tolerance: Patient limited by fatigue;Patient limited by pain Patient left: in bed;with call bell/phone within reach;with family/visitor present;with bed alarm set           Time: 1325-1346 PT Time Calculation (min) (ACUTE ONLY): 21 min   Charges:   PT Evaluation $PT Eval Low Complexity: 1 Procedure     PT G Codes:        Weston Anna, MPT Pager: [phone]

## 2016-03-31 ENCOUNTER — Ambulatory Visit (HOSPITAL_COMMUNITY): Payer: Self-pay | Admitting: Adult Health

## 2016-03-31 LAB — CBC
HEMATOCRIT: 26.4 % — AB (ref 39.0–52.0)
HEMOGLOBIN: 9 g/dL — AB (ref 13.0–17.0)
MCH: 29.6 pg (ref 26.0–34.0)
MCHC: 34.1 g/dL (ref 30.0–36.0)
MCV: 86.8 fL (ref 78.0–100.0)
Platelets: 125 10*3/uL — ABNORMAL LOW (ref 150–400)
RBC: 3.04 MIL/uL — ABNORMAL LOW (ref 4.22–5.81)
RDW: 15.1 % (ref 11.5–15.5)
WBC: 10.7 10*3/uL — AB (ref 4.0–10.5)

## 2016-03-31 LAB — BASIC METABOLIC PANEL
ANION GAP: 8 (ref 5–15)
BUN: 14 mg/dL (ref 6–20)
CALCIUM: 8 mg/dL — AB (ref 8.9–10.3)
CO2: 25 mmol/L (ref 22–32)
Chloride: 109 mmol/L (ref 101–111)
Creatinine, Ser: 0.73 mg/dL (ref 0.61–1.24)
GFR calc Af Amer: >60 mL/min (ref >60)
Glucose, Bld: 65 mg/dL (ref 65–99)
POTASSIUM: 2.8 mmol/L — AB (ref 3.5–5.1)
SODIUM: 142 mmol/L (ref 135–145)

## 2016-03-31 MED ORDER — KCL IN DEXTROSE-NACL 20-5-0.9 MEQ/L-%-% IV SOLN
INTRAVENOUS | Status: DC
  Administered 2016-03-31 – 2016-04-01 (×3): via INTRAVENOUS
  Administered 2016-04-02: 100 mL via INTRAVENOUS
  Administered 2016-04-03 – 2016-04-05 (×5): via INTRAVENOUS
  Administered 2016-04-06: 100 mL/h via INTRAVENOUS
  Administered 2016-04-07: 21:00:00 via INTRAVENOUS
  Administered 2016-04-07: 100 mL/h via INTRAVENOUS
  Administered 2016-04-08: 07:00:00 via INTRAVENOUS
  Filled 2016-03-31 (×17): qty 1000

## 2016-03-31 NOTE — Progress Notes (Signed)
3 Days Post-Op  Subjective: Still complains of pain  Objective: Vital signs in last 24 hours: Temp:  [97.9 F (36.6 C)-99.4 F (37.4 C)] 98.4 F (36.9 C) (11/07 0800) Resp:  [12-27] 12 (11/07 0800) BP: (106-165)/(55-93) 147/87 (11/07 0800) SpO2:  [92 %-100 %] 97 % (11/07 0800) Last BM Date:  (prior to admission)  Intake/Output from previous day: 11/06 0701 - 11/07 0700 In: 3539.2 [P.O.:160; I.V.:3029.2; IV Piggyback:350] Out: 4900 [Urine:2135; Drains:2725; Stool:40] Intake/Output this shift: Total I/O In: 250 [I.V.:250] Out: -   Resp: clear to auscultation bilaterally Cardio: regular rate and rhythm GI: soft, moderate tenderness. wound clean. ostomy pink with no output yet  Lab Results:   Recent Labs  03/30/16 0511 03/31/16 0545  WBC 8.8 10.7*  HGB 9.8* 9.0*  HCT 28.9* 26.4*  PLT 122* 125*   BMET  Recent Labs  03/30/16 0511 03/31/16 0545  NA 138 142  K 3.8 2.8*  CL 112* 109  CO2 21* 25  GLUCOSE 77 65  BUN 15 14  CREATININE 0.76 0.73  CALCIUM 7.9* 8.0*   PT/INR No results for input(s): LABPROT, INR in the last 72 hours. ABG No results for input(s): PHART, HCO3 in the last 72 hours.  Invalid input(s): PCO2, PO2  Studies/Results: No results found.  Anti-infectives: Anti-infectives    Start     Dose/Rate Route Frequency Ordered Stop   03/29/16 2000  cefTRIAXone (ROCEPHIN) 2 g in dextrose 5 % 50 mL IVPB     2 g 100 mL/hr over 30 Minutes Intravenous Every 24 hours 03/29/16 0853     03/28/16 2000  cefTRIAXone (ROCEPHIN) 1 g in dextrose 5 % 50 mL IVPB  Status:  Discontinued     1 g 100 mL/hr over 30 Minutes Intravenous Every 24 hours 03/28/16 1948 03/29/16 0853   03/28/16 2000  metroNIDAZOLE (FLAGYL) IVPB 500 mg     500 mg 100 mL/hr over 60 Minutes Intravenous Every 8 hours 03/28/16 1948     03/28/16 1215  ceFEPIme (MAXIPIME) 2 g in dextrose 5 % 50 mL IVPB     2 g 100 mL/hr over 30 Minutes Intravenous  Once 03/28/16 1213 03/28/16 1259   03/28/16  1215  metroNIDAZOLE (FLAGYL) IVPB 500 mg     500 mg 100 mL/hr over 60 Minutes Intravenous  Once 03/28/16 1213 03/28/16 1500      Assessment/Plan: s/p Procedure(s): EXPLORATORY LAPAROTOMY, SIGMOID COLECTOMY, COLOSTOMY (N/A) Advance diet. Start clears today Clamp g tube Continue rocephin and flagyl for colon perf POD 3 Hypokalemia. Add K to IVF  LOS: 3 days    TOTH III,PAUL S 03/31/2016

## 2016-04-01 LAB — TYPE AND SCREEN
ABO/RH(D): A POS
ANTIBODY SCREEN: NEGATIVE
UNIT DIVISION: 0
UNIT DIVISION: 0

## 2016-04-01 LAB — BASIC METABOLIC PANEL
Anion gap: 5 (ref 5–15)
BUN: 17 mg/dL (ref 6–20)
CALCIUM: 8.1 mg/dL — AB (ref 8.9–10.3)
CO2: 26 mmol/L (ref 22–32)
CREATININE: 0.58 mg/dL — AB (ref 0.61–1.24)
Chloride: 109 mmol/L (ref 101–111)
GFR calc Af Amer: >60 mL/min (ref >60)
GLUCOSE: 122 mg/dL — AB (ref 65–99)
Potassium: 2.8 mmol/L — ABNORMAL LOW (ref 3.5–5.1)
Sodium: 140 mmol/L (ref 135–145)

## 2016-04-01 LAB — CBC
HCT: 26.3 % — ABNORMAL LOW (ref 39.0–52.0)
Hemoglobin: 9 g/dL — ABNORMAL LOW (ref 13.0–17.0)
MCH: 29.6 pg (ref 26.0–34.0)
MCHC: 34.2 g/dL (ref 30.0–36.0)
MCV: 86.5 fL (ref 78.0–100.0)
PLATELETS: 142 10*3/uL — AB (ref 150–400)
RBC: 3.04 MIL/uL — ABNORMAL LOW (ref 4.22–5.81)
RDW: 15.2 % (ref 11.5–15.5)
WBC: 9.3 10*3/uL (ref 4.0–10.5)

## 2016-04-01 MED ORDER — BOOST / RESOURCE BREEZE PO LIQD
1.0000 | Freq: Three times a day (TID) | ORAL | Status: DC
  Administered 2016-04-01 – 2016-04-07 (×4): 1 via ORAL

## 2016-04-01 NOTE — Progress Notes (Signed)
4 Days Post-Op  Subjective: Complains of some mild nausea. No vomiting. Pain is about the same  Objective: Vital signs in last 24 hours: Temp:  [98.2 F (36.8 C)-98.9 F (37.2 C)] 98.2 F (36.8 C) (11/08 0400) Pulse Rate:  [81] 81 (11/07 1959) Resp:  [15-21] 18 (11/08 0000) BP: (120-151)/(61-85) 120/84 (11/08 0000) SpO2:  [91 %-98 %] 92 % (11/08 0000) Last BM Date:  (prior to admission)  Intake/Output from previous day: 11/07 0701 - 11/08 0700 In: 1076.7 [I.V.:976.7; IV Piggyback:100] Out: 2025 [Urine:1375; Stool:650] Intake/Output this shift: No intake/output data recorded.  Resp: clear to auscultation bilaterally Cardio: regular rate and rhythm GI: soft, mild to mod tenderness. wound clean. ostomy pink with small output  Lab Results:   Recent Labs  03/30/16 0511 03/31/16 0545  WBC 8.8 10.7*  HGB 9.8* 9.0*  HCT 28.9* 26.4*  PLT 122* 125*   BMET  Recent Labs  03/30/16 0511 03/31/16 0545  NA 138 142  K 3.8 2.8*  CL 112* 109  CO2 21* 25  GLUCOSE 77 65  BUN 15 14  CREATININE 0.76 0.73  CALCIUM 7.9* 8.0*   PT/INR No results for input(s): LABPROT, INR in the last 72 hours. ABG No results for input(s): PHART, HCO3 in the last 72 hours.  Invalid input(s): PCO2, PO2  Studies/Results: No results found.  Anti-infectives: Anti-infectives    Start     Dose/Rate Route Frequency Ordered Stop   03/29/16 2000  cefTRIAXone (ROCEPHIN) 2 g in dextrose 5 % 50 mL IVPB     2 g 100 mL/hr over 30 Minutes Intravenous Every 24 hours 03/29/16 0853     03/28/16 2000  cefTRIAXone (ROCEPHIN) 1 g in dextrose 5 % 50 mL IVPB  Status:  Discontinued     1 g 100 mL/hr over 30 Minutes Intravenous Every 24 hours 03/28/16 1948 03/29/16 0853   03/28/16 2000  metroNIDAZOLE (FLAGYL) IVPB 500 mg     500 mg 100 mL/hr over 60 Minutes Intravenous Every 8 hours 03/28/16 1948     03/28/16 1215  ceFEPIme (MAXIPIME) 2 g in dextrose 5 % 50 mL IVPB     2 g 100 mL/hr over 30 Minutes  Intravenous  Once 03/28/16 1213 03/28/16 1259   03/28/16 1215  metroNIDAZOLE (FLAGYL) IVPB 500 mg     500 mg 100 mL/hr over 60 Minutes Intravenous  Once 03/28/16 1213 03/28/16 1500      Assessment/Plan: s/p Procedure(s): EXPLORATORY LAPAROTOMY, SIGMOID COLECTOMY, COLOSTOMY (N/A) Stay on clears and keep g tube clamped  Consult physical therapy Transfer to floor Continue dressing changes Continue rocephin and flagyl for perforated colon  LOS: 4 days    TOTH III,Keishawn Rajewski S 04/01/2016

## 2016-04-01 NOTE — Progress Notes (Signed)
PT Cancellation Note  Patient Details Name: Frank Castillo MRN: [id number] DOB: July 28, 1950   Cancelled Treatment:    Reason Eval/Treat Not Completed: Fatigue/lethargy limiting ability to participate. Patient had just moved rooms at the time PT checked on him.    Claretha Cooper 04/01/2016, 5:03 PM Tresa Endo PT [phone]

## 2016-04-01 NOTE — Progress Notes (Signed)
Nutrition Follow-up  DOCUMENTATION CODES:   Not applicable  INTERVENTION:  - Will order Boost Breeze po TID, each supplement provides 250 kcal and 9 grams of protein - Continue CLD and advance diet as medically feasible. - Continue to encourage PO intakes. - RD will follow-up 11/10.  NUTRITION DIAGNOSIS:   Inadequate oral intake related to inability to eat as evidenced by NPO status. -diet advanced to CLD with minimal intakes.   GOAL:   Patient will meet greater than or equal to 90% of their needs -unmet  MONITOR:   PO intake, Supplement acceptance, Diet advancement, Weight trends, Labs, Skin, I & O's  ASSESSMENT:   65 y.o. M with a h/o liver cirrhosis and tonsilar cancer, s/p XRT completed in May 17. Reports several days of intermittent nausea followed by severe, acute onset, abdominal pain this am. Denies any fevers. Reported to [facility] ED and was found to have diffuse peritonitis and free air on CT scan. Was transferred here due to h/o radiation to the neck and no anesthesiologist on call at [facility]. Pt takes chronic narcotics and has been weaning off these recently and increasing his ibuprofen use.  11/8 No new weight since 11/4. Diet advanced from NPO to CLD yesterday at 0904 with no intakes documented yesterday. This AM pt was able to consume coffee, juice, and a few small sips of broth. He states he is having severe abdominal pain and that it was causing nausea but that RN recently gave him Zofran. He is unsure if abdominal pain was worsened by PO intakes but states "it certainly did not make it better." PEG has been clamped since yesterday per surgery.   Medications reviewed; PRN Zofran. Labs reviewed; K: 2.8 mmol/L, Ca: 8 mg/dL.  IVF: D5-NS-20 mEq KCl @ 100 mL/hr (408 kcal).   11/6 - Pt has been NPO since admission.  - He is POD #2 ex lap with sigmoid colectomy and end colostomy.  - Pt taking sips of water throughout the day today and is very eager for diet advancement.  -  Last radiation treatment was in May 2017.  - He has a PEG and was previously using 6-8 cans of Jevity 1.2/day but has not used PEG in the past 1 month as PO intakes have improved/increased.  - He was consuming a wide variety of foods/food groups PTA and had been working with SLP and RD at [facility].  - Oral nutrition supplements included both Ensure and Boost.  - He sometimes experiences coughing and pain with PO intakes but that since last radiation treatment this has gradually improved.  - Pt states that his weight over the past 1 month has remained stable at ~150 lbs.  - He reports that max weight over the past 1 month was 154 lbs and at that time he was informed that if he could maintain this higher weight then PEG could be removed.  - Per chart review, weight has been stable (150-154 lbs) over the past 4 months.  - No muscle or fat wasting noted during physical assessment.  - Pt denies any abdominal pain or nausea at time of RD visit; noted that pt reported nausea this AM.  - He states that he is very hungry and requests diet advancement as soon as is medically feasible.   IVF: NS @ 125 mL/hr.     Diet Order:  Diet clear liquid Room service appropriate? Yes; Fluid consistency: Thin  Skin:  Wound (see comment) (Abdominal incision from 03/28/16)  Last BM:  11/8  Height:   Ht Readings from Last 1 Encounters:  03/28/16 5\' 10"  (1.778 m)    Weight:   Wt Readings from Last 1 Encounters:  03/28/16 150 lb (68 kg)    Ideal Body Weight:  75.45 kg  BMI:  Body mass index is 21.52 kg/m.  Estimated Nutritional Needs:   Kcal:  [id number] (30-33 kcal/kg)  Protein:  82-95 grams (1.2-1.5 grams/kg)  Fluid:  >/= 2 L/day  EDUCATION NEEDS:   No education needs identified at this time    Jarome Matin, MS, RD, LDN Inpatient Clinical Dietitian Pager # [phone] After hours/weekend pager # [phone]

## 2016-04-02 LAB — CBC
HEMATOCRIT: 26.1 % — AB (ref 39.0–52.0)
Hemoglobin: 8.9 g/dL — ABNORMAL LOW (ref 13.0–17.0)
MCH: 29.9 pg (ref 26.0–34.0)
MCHC: 34.1 g/dL (ref 30.0–36.0)
MCV: 87.6 fL (ref 78.0–100.0)
PLATELETS: 133 10*3/uL — AB (ref 150–400)
RBC: 2.98 MIL/uL — ABNORMAL LOW (ref 4.22–5.81)
RDW: 15.3 % (ref 11.5–15.5)
WBC: 7.2 10*3/uL (ref 4.0–10.5)

## 2016-04-02 LAB — BASIC METABOLIC PANEL
Anion gap: 6 (ref 5–15)
BUN: 13 mg/dL (ref 6–20)
CALCIUM: 7.8 mg/dL — AB (ref 8.9–10.3)
CO2: 27 mmol/L (ref 22–32)
Chloride: 110 mmol/L (ref 101–111)
Creatinine, Ser: 0.56 mg/dL — ABNORMAL LOW (ref 0.61–1.24)
GFR calc Af Amer: >60 mL/min (ref >60)
GLUCOSE: 119 mg/dL — AB (ref 65–99)
POTASSIUM: 3 mmol/L — AB (ref 3.5–5.1)
SODIUM: 143 mmol/L (ref 135–145)

## 2016-04-02 MED ORDER — SODIUM CHLORIDE 0.9% FLUSH
10.0000 mL | INTRAVENOUS | Status: DC | PRN
  Administered 2016-04-03 – 2016-04-08 (×3): 10 mL
  Filled 2016-04-02 (×3): qty 40

## 2016-04-02 MED ORDER — SODIUM CHLORIDE 0.9% FLUSH
10.0000 mL | Freq: Two times a day (BID) | INTRAVENOUS | Status: DC
  Administered 2016-04-08: 10 mL

## 2016-04-02 MED ORDER — SODIUM CHLORIDE 0.9 % IV SOLN
Freq: Once | INTRAVENOUS | Status: AC
  Administered 2016-04-02: 12:00:00 via INTRAVENOUS
  Filled 2016-04-02: qty 1000

## 2016-04-02 NOTE — Clinical Social Work Note (Signed)
Clinical Social Work Assessment  Patient Details  Name: Frank Castillo MRN: [id number] Date of Birth: [birth year]  Date of referral:  04/02/16               Reason for consult:  Facility Placement, Discharge Planning                Permission sought to share information with:  Family Supports, Customer service manager, Case Manager Permission granted to share information::  Yes, Verbal Permission Granted  Name::      Chemical engineer )  Agency::   (SNF's )  Relationship::   [phone])  Contact Information:   [phone])  Housing/Transportation Living arrangements for the past 2 months:  [place] of Information:  Patient (Sister ) Patient Interpreter Needed:  None Criminal Activity/Legal Involvement Pertinent to Current Situation/Hospitalization:  No - Comment as needed Significant Relationships:  Siblings Lives with:  Self Do you feel safe going back to the place where you live?  No Need for family participation in patient care:  Yes (Comment)  Care giving concerns:  Patient requiring short term SNF placement for rehab, wound care and ostomy care.    Social Worker assessment / plan:  MSW met with patient and his sister, Frank Castillo present at bedside in reference to post-acute placement for SNF. MSW introduced MSW role and SNF process. SNF list reviewed and provided. MSW explained [employer] authorization process as well. Patient agreeable to SNF placement and sisters, Frank Castillo and Frank Castillo will assist with discharge planning. Patient is interested in [facility]. No further concerns reported at this time. MSW will continue to follow pt and pt's family for continued support and to facilitate pt's discharge needs once stable.    Employment status:  Disabled (Comment on whether or not currently receiving Disability) Insurance information:  Managed Medicare PT Recommendations:  [place] / Referral to community resources:  [place]  Patient/Family's Response to care:  Patient a/o x4. Patient and sister, Frank Castillo agreeable to SNF placement. Patient's sister supportive and strongly involved in pt's care.    Patient/Family's Understanding of and Emotional Response to Diagnosis, Current Treatment, and Prognosis: Patient and sister knowledgeable of medical interventions and discharge planning.   Emotional Assessment Appearance:  Appears stated age Attitude/Demeanor/Rapport:   (Pleasant ) Affect (typically observed):  Accepting, Appropriate, Pleasant Orientation:  Oriented to Situation, Oriented to  Time, Oriented to Place, Oriented to Self Alcohol / Substance use:  Not Applicable Psych involvement (Current and /or in the community):  No (Comment)  Discharge Needs  Concerns to be addressed:  Care Coordination Readmission within the last 30 days:  No Current discharge risk:  Dependent with Mobility Barriers to Discharge:  Ship broker, Continued Medical Work up   [employer], Merkel A 04/02/2016, 12:56 PM

## 2016-04-02 NOTE — Progress Notes (Signed)
Frank Castillo Surgery Progress Note  5 Days Post-Op  Subjective: Abdominal pain improving, rated as a 7/10. Nausea improved. Denies vomiting. g-tube still to gravity. Gas and small amount of liquid stool in ostomy pouch. Ambulated in the hall this AM.  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  Objective: Vital signs in last 24 hours: Temp:  [97.7 F (36.5 C)-98.7 F (37.1 C)] 97.7 F (36.5 C) (11/09 0535) Pulse Rate:  [82-88] 82 (11/09 0535) Resp:  [10-18] 16 (11/09 0756) BP: (131-152)/(74-99) 131/74 (11/09 0535) SpO2:  [93 %-99 %] 97 % (11/09 0756) Last BM Date: 04/01/16  Intake/Output from previous day: 11/08 0701 - 11/09 0700 In: 3915 [P.O.:815; I.V.:2800; IV Piggyback:300] Out: 2450 [Urine:1075; Drains:900; Stool:475] Intake/Output this shift: Total I/O In: 240 [P.O.:240] Out: -   PE: Gen:  Alert, NAD, pleasant Pulm:  CTABL, no W/R/R Abd: Soft, appropriately tender, ND, +BS, ostomy pouch with small amount of gas a brown liquid stool.    Ext:  No erythema, edema, or tenderness  Lab Results:   Recent Labs  04/01/16 0900 04/02/16 0445  WBC 9.3 7.2  HGB 9.0* 8.9*  HCT 26.3* 26.1*  PLT 142* 133*   BMET  Recent Labs  04/01/16 0900 04/02/16 0445  NA 140 143  K 2.8* 3.0*  CL 109 110  CO2 26 27  GLUCOSE 122* 119*  BUN 17 13  CREATININE 0.58* 0.56*  CALCIUM 8.1* 7.8*   PT/INR No results for input(s): LABPROT, INR in the last 72 hours. CMP     Component Value Date/Time   NA 143 04/02/2016 0445   NA 132 (L) 10/21/2011 1614   K 3.0 (L) 04/02/2016 0445   K 3.6 10/21/2011 1614   CL 110 04/02/2016 0445   CL 92 (L) 10/21/2011 1614   CO2 27 04/02/2016 0445   CO2 32 10/21/2011 1614   GLUCOSE 119 (H) 04/02/2016 0445   GLUCOSE 105 (H) 10/21/2011 1614   BUN 13 04/02/2016 0445   BUN 7  10/21/2011 1614   CREATININE 0.56 (L) 04/02/2016 0445   CREATININE 0.73 10/21/2011 1614   CALCIUM 7.8 (L) 04/02/2016 0445   CALCIUM 7.9 (L) 10/21/2011 1614   PROT 6.6 03/28/2016 0824   PROT 6.0 (L) 10/21/2011 1614   ALBUMIN 3.9 03/28/2016 0824   ALBUMIN 2.2 (L) 10/21/2011 1614   AST 20 03/28/2016 0824   AST 97 (H) 10/21/2011 1614   ALT 13 (L) 03/28/2016 0824   ALT 34 10/21/2011 1614   ALKPHOS 54 03/28/2016 0824   ALKPHOS 83 10/21/2011 1614   BILITOT 0.6 03/28/2016 0824   BILITOT 1.1 (H) 10/21/2011 1614   GFRNONAA >60 04/02/2016 0445   GFRNONAA >60 10/21/2011 1614   GFRAA >60 04/02/2016 0445   GFRAA >60 10/21/2011 1614   Lipase     Component Value Date/Time   LIPASE 39 03/28/2016 0824   LIPASE 123 10/21/2011 1614       Studies/Results: No results found.  Anti-infectives: Anti-infectives    Start     Dose/Rate Route Frequency Ordered Stop   03/29/16 2000  cefTRIAXone (ROCEPHIN) 2 g in dextrose 5 % 50 mL IVPB     2 g 100 mL/hr over 30 Minutes Intravenous Every 24 hours 03/29/16 0853     03/28/16 2000  cefTRIAXone (ROCEPHIN) 1 g in dextrose 5 %  50 mL IVPB  Status:  Discontinued     1 g 100 mL/hr over 30 Minutes Intravenous Every 24 hours 03/28/16 1948 03/29/16 0853   03/28/16 2000  metroNIDAZOLE (FLAGYL) IVPB 500 mg     500 mg 100 mL/hr over 60 Minutes Intravenous Every 8 hours 03/28/16 1948     03/28/16 1215  ceFEPIme (MAXIPIME) 2 g in dextrose 5 % 50 mL IVPB     2 g 100 mL/hr over 30 Minutes Intravenous  Once 03/28/16 1213 03/28/16 1259   03/28/16 1215  metroNIDAZOLE (FLAGYL) IVPB 500 mg     500 mg 100 mL/hr over 60 Minutes Intravenous  Once 03/28/16 1213 03/28/16 1500     Assessment/Plan s/p Procedure(s): EXPLORATORY LAPAROTOMY, SIGMOID COLECTOMY, COLOSTOMY (N/A), 03/28/16 POD#5 Stay on clears and keep g tube clamped  Continue physical therapy Continue dressing changes Continue rocephin and flagyl for perforated colon  WBC WNL   Hypokalemia - 3.0; will  replace   Dispo: g-tube clamping trial, continue clear liquids; ambulate  Consult to case management for dispo after discharge    LOS: 5 days    Jill Alexanders , [facility] Surgery 04/02/2016, 9:02 AM Pager: [phone] Consults: [phone] Mon-Fri 7:00 am-4:30 pm Sat-Sun 7:00 am-11:30 am

## 2016-04-02 NOTE — Progress Notes (Signed)
Patient with nausea gas and abd distention unclamped G tube.

## 2016-04-02 NOTE — Clinical Social Work Placement (Signed)
   CLINICAL SOCIAL WORK PLACEMENT  NOTE  Date:  04/02/2016  Patient Details  Name: Frank Castillo MRN: [id number] Date of Birth: 10-12-1950  Clinical Social Work is seeking post-discharge placement for this patient at the [place] level of care (*CSW will initial, date and re-position this form in  chart as items are completed):  Yes   Patient/family provided with [place] Work Department's list of facilities offering this level of care within the geographic area requested by the patient (or if unable, by the patient's family).  Yes   Patient/family informed of their freedom to choose among providers that offer the needed level of care, that participate in Medicare, Medicaid or managed care program needed by the patient, have an available bed and are willing to accept the patient.  Yes   Patient/family informed of [place]'s ownership interest in [facility] and [facility], as well as of the fact that they are under no obligation to receive care at these facilities.  PASRR submitted to EDS on 04/02/16     PASRR number received on 04/02/16     Existing PASRR number confirmed on       FL2 transmitted to all facilities in geographic area requested by pt/family on 04/02/16     FL2 transmitted to all facilities within larger geographic area on       Patient informed that his/her managed care company has contracts with or will negotiate with certain facilities, including the following:            Patient/family informed of bed offers received.  Patient chooses bed at       Physician recommends and patient chooses bed at      Patient to be transferred to   on  .  Patient to be transferred to facility by       Patient family notified on   of transfer.  Name of family member notified:        PHYSICIAN Please sign FL2     Additional Comment:    _______________________________________________ [name] 04/02/2016, 1:14  PM

## 2016-04-02 NOTE — Progress Notes (Signed)
Frank Castillo paged patient continues to sip on clear liquids with no significant intake.  Vague nausea requiring IV Zofran.  Patient g tube continues to be clamped.  Ordered to continue to monitor continue with the clear liquid sips and leave g tube clamped unless patient becomes nauseated

## 2016-04-02 NOTE — NC FL2 (Signed)
[place] LEVEL OF CARE SCREENING TOOL     IDENTIFICATION  Patient Name: Frank Castillo Birthdate: 15-Jul-1950 Sex: male Admission Date (Current Location): 03/28/2016  [facility] and [place] Number:  Herbalist and Address:  [facility],  [place] [address], [place]      Provider Number: [id number]  Attending Physician Name and Address:  Md Edison Pace, MD  Relative Name and Phone Number:       Current Level of Care: Hospital Recommended Level of Care: [place] Prior Approval Number:    Date Approved/Denied:   PASRR Number:  ([id number] Castillo)  Discharge Plan: SNF    Current Diagnoses: Patient Active Problem List   Diagnosis Date Noted  . Perforated sigmoid colon ([place]) 03/28/2016  . Reactive depression (situational) 02/29/2016  . 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 Tobacco use disorder 02/27/2016  . Tinnitus 02/27/2016  . Dysuria 10/25/2015  . Decreased oral intake 10/25/2015  . Malnutrition of moderate degree 10/19/2015  . Antineoplastic chemotherapy induced pancytopenia ([place]) 10/18/2015  . Fever   . Candidiasis of mouth and esophagus ([place]) 10/17/2015  . Neutropenia, febrile ([place]) 10/17/2015  . Shingles rash 10/17/2015  . Nausea with vomiting 09/20/2015  . Hyponatremia 09/20/2015  . Oral pharyngeal candidiasis 09/20/2015  . Antineoplastic chemotherapy induced anemia 09/20/2015  . Hypertension   . Cirrhosis ([place])   . Portal hypertension ([place])   . Cancer of tonsillar fossa ([place]) 08/02/2015    Orientation RESPIRATION BLADDER Height & Weight     Self, Time, Situation, Place  Normal Continent Weight: 150 lb (68 kg) Height:  5\' 10"  (177.8 cm)  BEHAVIORAL SYMPTOMS/MOOD NEUROLOGICAL BOWEL NUTRITION STATUS   (none )  (none ) Continent Diet (clear liquids )  AMBULATORY STATUS COMMUNICATION OF NEEDS Skin   Extensive Assist Verbally Surgical wounds                       Personal Care Assistance Level of  Assistance  Bathing, Dressing, Feeding Bathing Assistance: Limited assistance Feeding assistance: Independent Dressing Assistance: Limited assistance     Functional Limitations Info  Speech, Hearing, Sight Sight Info: Adequate Hearing Info: Adequate Speech Info: Adequate    SPECIAL CARE FACTORS FREQUENCY  PT (By licensed PT)     PT Frequency: 3              Contractures      Additional Factors Info  Code Status, Allergies Code Status Info: FULL CODE  Allergies Info: Bee Venom           Current Medications (04/02/2016):  This is the current hospital active medication list Current Facility-Administered Medications  Medication Dose Route Frequency Provider Last Rate Last Dose  . cefTRIAXone (ROCEPHIN) 2 g in dextrose 5 % 50 mL IVPB  2 g Intravenous [id number] [place]on Ruff, MD   2 g at 04/01/16 2143  . dextrose 5 % and 0.9 % NaCl with KCl 20 mEq/L infusion   Intravenous Continuous Autumn Messing III, MD 100 mL/hr at 04/02/16 0600    . diphenhydrAMINE (BENADRYL) injection 12.5 mg  12.5 mg Intravenous [id number] PRN [place]on Ruff, MD       Or  . diphenhydrAMINE (BENADRYL) 12.5 MG/5ML elixir 12.5 mg  12.5 mg Oral [id number] PRN [place]on Ruff, MD      . enoxaparin (LOVENOX) injection 40 mg  40 mg Subcutaneous [id number] Le[place]on Ruff, MD   40 mg at 04/02/16 0753  . escitalopram (LEXAPRO) tablet 20 mg  20 mg Oral Daily Le[place]on Ruff, MD   20 mg at 04/02/16 0945  . feeding supplement (BOOST / RESOURCE BREEZE) liquid 1 Container  1 Container Oral TID BM Rigoberto Noel, MD   1 Container at 04/01/16 2000  . fentaNYL (DURAGESIC - dosed mcg/hr) 12.5 mcg  12.5 mcg Transdermal Q3 days [place]on Ruff, MD      . fentaNYL 40 mcg/mL PCA injection   Intravenous [id number] Le[place]on Ruff, MD      . lip balm (CARMEX) ointment   Topical PRN Autumn Messing III, MD      . loratadine (CLARITIN) tablet 10 mg  10 mg Oral Daily [place]on Ruff, MD   10 mg at 04/02/16 0945  . LORazepam (ATIVAN) tablet 0.5 mg  0.5 mg Oral Q4H PRN Darci Current  Simaan, PA-C   0.5 mg at 04/01/16 1741  . MEDLINE mouth rinse  15 mL Mouth Rinse BID [place]on Ruff, MD   15 mL at 04/02/16 1000  . menthol-cetylpyridinium (CEPACOL) lozenge 3 mg  1 lozenge Oral PRN [place]on Ruff, MD      . metroNIDAZOLE (FLAGYL) IVPB 500 mg  500 mg Intravenous [id number] [place]on Ruff, MD   [id number] mg at 04/02/16 0406  . naloxone [facility]) injection 0.4 mg  0.4 mg Intravenous PRN [place]on Ruff, MD       And  . sodium chloride flush (NS) 0.9 % injection 9 mL  9 mL Intravenous PRN [place]on Ruff, MD      . ondansetron [facility]) tablet 4 mg  4 mg Oral [id number] PRN Le[place]on Ruff, MD       Or  . ondansetron [facility]) injection 4 mg  4 mg Intravenous [id number] PRN [place]on Ruff, MD   4 mg at 04/01/16 1504  . pilocarpine (SALAGEN) tablet 5 mg  5 mg Oral TID Le[place]on Ruff, MD   5 mg at 04/02/16 0945  . sodium chloride (OCEAN) 0.65 % nasal spray 1 spray  1 spray Each Nare PRN Johnathan Hausen, MD   1 spray at 03/29/16 1549  . sodium chloride 0.9 % 1,000 mL with potassium chloride 80 mEq infusion   Intravenous Once Elizabeth S Simaan, PA-C      . sodium chloride flush (NS) 0.9 % injection 10-40 mL  10-40 mL Intracatheter [id number] [place]on Ruff, MD      . sodium chloride flush (NS) 0.9 % injection 10-40 mL  10-40 mL Intracatheter PRN [place]on Ruff, MD       Facility-Administered Medications Ordered in Other Encounters  Medication Dose Route Frequency Provider Last Rate Last Dose  . 0.9 %  sodium chloride infusion   Intravenous Continuous Manon Hilding Kefalas, PA-C      . 0.9 %  sodium chloride infusion   Intravenous Continuous Manon Hilding Kefalas, PA-C      . 0.9 %  sodium chloride infusion   Intravenous Continuous Shannon K Penland, MD      . 0.9 %  sodium chloride infusion   Intravenous Continuous Patrici Ranks, MD      . 0.9 %  sodium chloride infusion   Intravenous Continuous Patrici Ranks, MD         Discharge Medications: Please see discharge summary for Castillo list of discharge medications.  Relevant Imaging  Results:  Relevant Lab Results:   Additional Information  (SSN [id number])  Frank Castillo

## 2016-04-02 NOTE — Clinical Social Work Placement (Signed)
Patient chooses bed at [facility], [place].   CLINICAL SOCIAL WORK PLACEMENT  NOTE  Date:  04/02/2016  Patient Details  Name: Frank Castillo MRN: [id number] Date of Birth: 1950/09/23  Clinical Social Work is seeking post-discharge placement for this patient at the [place] level of care (*CSW will initial, date and re-position this form in  chart as items are completed):  Yes   Patient/family provided with [place] Work Department's list of facilities offering this level of care within the geographic area requested by the patient (or if unable, by the patient's family).  Yes   Patient/family informed of their freedom to choose among providers that offer the needed level of care, that participate in Medicare, Medicaid or managed care program needed by the patient, have an available bed and are willing to accept the patient.  Yes   Patient/family informed of [facility]'s ownership interest in [facility] and [facility], as well as of the fact that they are under no obligation to receive care at these facilities.  PASRR submitted to EDS on 04/02/16     PASRR number received on 04/02/16     Existing PASRR number confirmed on       FL2 transmitted to all facilities in geographic area requested by pt/family on 04/02/16     FL2 transmitted to all facilities within larger geographic area on       Patient informed that his/her managed care company has contracts with or will negotiate with certain facilities, including the following:        Yes   Patient/family informed of bed offers received.  Patient chooses bed at  ([facility] )     Physician recommends and patient chooses bed at      Patient to be transferred to  ([facility] ) on  .  Patient to be transferred to facility by       Patient family notified on   of transfer.  Name of family member notified:   (Pt's sister, Eustaquio Maize )     PHYSICIAN Please sign FL2      Additional Comment:    _______________________________________________ [name] 04/02/2016, 2:33 PM

## 2016-04-02 NOTE — Progress Notes (Signed)
Physical Therapy Treatment Patient Details Name: Frank Castillo MRN: [id number] DOB: 09-26-1950 Today's Date: 04/02/2016    History of Present Illness 65 yo male admitted with perforated sigmoid colon. 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 Hx of liver cirrhosis, tonsillar cancer.     PT Comments    Patient was seen in bed upon arrival with family present. Pain was rated as a 7/10 before treatment. Performed supine to sit x min guard with VC's for safe hand placement and technique. Sit to stand x minA with VC's for UE placement and to scoot to EOB to have BLE's on floor. Pain increased to a 8-9/10 while sitting on EOB. Gait x 26' using a RW with VC's for safety awareness with gait. Patient displays unsteady gait and decreased gait speed. Patient c/o of lightheadedness when sitting on EOB and during ambulation. See Orthostatic Vitals. No issues with Bp. Patient is limited due to weakness, decreased endurance and activity tolerance and pain level. Consulted with PT pt progressing slowly and will need ST Rehab at SNF prior to returning home.   Follow Up Recommendations  SNF     Equipment Recommendations  Rolling walker with 5" wheels    Recommendations for Other Services       Precautions / Restrictions Precautions Precautions: Fall Precaution Comments: multiple lines/leads, colostomy, jejunostomy Restrictions Weight Bearing Restrictions: No    Mobility  Bed Mobility Overal bed mobility: Needs Assistance Bed Mobility: Supine to Sit Rolling: Min guard         General bed mobility comments: requires increased time. VC's required for safe hand placement and technique. Pt c/o lightheadedness once sitting to EOB.   Transfers Overall transfer level: Needs assistance Equipment used: Rolling walker (2 wheeled) Transfers: Sit to/from Stand Sit to Stand: +2 safety/equipment;Min assist         General transfer comment: VC's for UE placement and to reach back for the  recliner before sitting.   Ambulation/Gait Ambulation/Gait assistance: Min assist;+2 safety/equipment Ambulation Distance (Feet): 26 Feet Assistive device: Rolling walker (2 wheeled)   Gait velocity: decreased Gait velocity interpretation: Below normal speed for age/gender General Gait Details: decreased gait velocity d/t weakness and pain. c/o lightheadedness during gait. Pain 8/10.    Stairs            Wheelchair Mobility    Modified Rankin (Stroke Patients Only)       Balance                                    Cognition Arousal/Alertness: Awake/alert Behavior During Therapy: WFL for tasks assessed/performed Overall Cognitive Status: Within Functional Limits for tasks assessed                      Exercises      General Comments        Pertinent Vitals/Pain Pain Assessment: 0-10 Pain Score: 7  Faces Pain Scale:  (increased to an 8-9 sitting on EOB. ) Pain Location: abdomen Pain Descriptors / Indicators: Discomfort;Grimacing Pain Intervention(s): Limited activity within patient's tolerance;Repositioned;Monitored during session    Home Living                      Prior Function            PT Goals (current goals can now be found in the care plan section) Progress towards PT goals: Progressing toward goals  Frequency    Min 3X/week      PT Plan Discharge plan needs to be updated    Co-evaluation             End of Session Equipment Utilized During Treatment: Gait belt Activity Tolerance: Patient limited by fatigue;Patient limited by pain Patient left: with call bell/phone within reach;with family/visitor present;with bed alarm set;in chair     Time: [id number] PT Time Calculation (min) (ACUTE ONLY): 29 min  Charges:  $Gait Training: 8-22 mins $Therapeutic Activity: 8-22 mins                    G CodesHall Busing, SPTA [facility] Acute Rehab [phone]  Present and agree with  above  Rica Koyanagi  PTA [facility]  Acute  Rehab Pager      [phone]   Agree with PTAs above assessment. Pt has not progressed enough to return home. ST SNF is necessary and recommended.   Weston Anna, MPT [phone]

## 2016-04-03 ENCOUNTER — Telehealth (HOSPITAL_COMMUNITY): Payer: Self-pay | Admitting: Emergency Medicine

## 2016-04-03 LAB — BASIC METABOLIC PANEL
Anion gap: 4 — ABNORMAL LOW (ref 5–15)
BUN: 10 mg/dL (ref 6–20)
CHLORIDE: 108 mmol/L (ref 101–111)
CO2: 26 mmol/L (ref 22–32)
CREATININE: 0.56 mg/dL — AB (ref 0.61–1.24)
Calcium: 7.6 mg/dL — ABNORMAL LOW (ref 8.9–10.3)
GFR calc Af Amer: >60 mL/min (ref >60)
GFR calc non Af Amer: >60 mL/min (ref >60)
GLUCOSE: 109 mg/dL — AB (ref 65–99)
POTASSIUM: 3.3 mmol/L — AB (ref 3.5–5.1)
SODIUM: 138 mmol/L (ref 135–145)

## 2016-04-03 LAB — CBC
HEMATOCRIT: 27.4 % — AB (ref 39.0–52.0)
Hemoglobin: 8.9 g/dL — ABNORMAL LOW (ref 13.0–17.0)
MCH: 28.3 pg (ref 26.0–34.0)
MCHC: 32.5 g/dL (ref 30.0–36.0)
MCV: 87 fL (ref 78.0–100.0)
PLATELETS: 155 10*3/uL (ref 150–400)
RBC: 3.15 MIL/uL — ABNORMAL LOW (ref 4.22–5.81)
RDW: 15.2 % (ref 11.5–15.5)
WBC: 8.9 10*3/uL (ref 4.0–10.5)

## 2016-04-03 MED ORDER — ENSURE ENLIVE PO LIQD
237.0000 mL | Freq: Three times a day (TID) | ORAL | Status: DC
  Administered 2016-04-03 – 2016-04-08 (×8): 237 mL via ORAL

## 2016-04-03 MED ORDER — SODIUM CHLORIDE 0.9 % IV SOLN
Freq: Once | INTRAVENOUS | Status: AC
  Administered 2016-04-03: 09:00:00 via INTRAVENOUS
  Filled 2016-04-03: qty 1000

## 2016-04-03 NOTE — Telephone Encounter (Signed)
Called to check on pt.  He is still admitted at [facility].  He will more than likely be discharged to the [facility] for rehab.  He said that he needs to get a little stronger and learn how to change and work with his ostomy.  I told him to keep me updated and if he needed anything dont hesitate to call.

## 2016-04-03 NOTE — Progress Notes (Signed)
[facility] Progress Note  6 Days Post-Op  Subjective: Abdominal pain rated 6/10, worse with movement. Reports having to place G-tube to gravity yesterday evening, however he re-clamped his g-tube around 1:00 AM and has been less nauseated since that time. He also reports increased ostomy output. Working with therapies.  Objective: Vital signs in last 24 hours: Temp:  [98 F (36.7 C)-99.1 F (37.3 C)] 98.5 F (36.9 C) (11/10 0536) Pulse Rate:  [68-97] 68 (11/10 0536) Resp:  [10-18] 14 (11/10 0536) BP: (118-138)/(69-81) 133/79 (11/10 0536) SpO2:  [93 %-100 %] 94 % (11/10 0536) Last BM Date: 04/01/16  Intake/Output from previous day: 11/09 0701 - 11/10 0700 In: 2570 [P.O.:480; I.V.:1940; IV Piggyback:150] Out: [id number] [Urine:2000; Drains:545; Stool:725] Intake/Output this shift: No intake/output data recorded.  PE: Gen:  Alert, NAD, pleasant Pulm:  CTABL, no W/R/R Abd: Soft, appropriately tender, ND, +BS, ostomy pouch with small amount of gas a brown liquid stool. G tube clamped.  Lab Results:   Recent Labs  04/02/16 0445 04/03/16 0500  WBC 7.2 8.9  HGB 8.9* 8.9*  HCT 26.1* 27.4*  PLT 133* 155   BMET  Recent Labs  04/02/16 0445 04/03/16 0500  NA 143 138  K 3.0* 3.3*  CL 110 108  CO2 27 26  GLUCOSE 119* 109*  BUN 13 10  CREATININE 0.56* 0.56*  CALCIUM 7.8* 7.6*   CMP     Component Value Date/Time   NA 138 04/03/2016 0500   NA 132 (L) 10/21/2011 1614   K 3.3 (L) 04/03/2016 0500   K 3.6 10/21/2011 1614   CL 108 04/03/2016 0500   CL 92 (L) 10/21/2011 1614   CO2 26 04/03/2016 0500   CO2 32 10/21/2011 1614   GLUCOSE 109 (H) 04/03/2016 0500   GLUCOSE 105 (H) 10/21/2011 1614   BUN 10 04/03/2016 0500   BUN 7 10/21/2011 1614   CREATININE 0.56 (L) 04/03/2016 0500   CREATININE 0.73 10/21/2011 1614   CALCIUM 7.6 (L) 04/03/2016 0500   CALCIUM 7.9 (L) 10/21/2011 1614   PROT 6.6 03/28/2016 0824   PROT 6.0 (L) 10/21/2011 1614   ALBUMIN 3.9  03/28/2016 0824   ALBUMIN 2.2 (L) 10/21/2011 1614   AST 20 03/28/2016 0824   AST 97 (H) 10/21/2011 1614   ALT 13 (L) 03/28/2016 0824   ALT 34 10/21/2011 1614   ALKPHOS 54 03/28/2016 0824   ALKPHOS 83 10/21/2011 1614   BILITOT 0.6 03/28/2016 0824   BILITOT 1.1 (H) 10/21/2011 1614   GFRNONAA >60 04/03/2016 0500   GFRNONAA >60 10/21/2011 1614   GFRAA >60 04/03/2016 0500   GFRAA >60 10/21/2011 1614   Lipase     Component Value Date/Time   LIPASE 39 03/28/2016 0824   LIPASE 123 10/21/2011 1614   Studies/Results: No results found.  Anti-infectives: Anti-infectives    Start     Dose/Rate Route Frequency Ordered Stop   03/29/16 2000  cefTRIAXone (ROCEPHIN) 2 g in dextrose 5 % 50 mL IVPB     2 g 100 mL/hr over 30 Minutes Intravenous Every 24 hours 03/29/16 0853     03/28/16 2000  cefTRIAXone (ROCEPHIN) 1 g in dextrose 5 % 50 mL IVPB  Status:  Discontinued     1 g 100 mL/hr over 30 Minutes Intravenous Every 24 hours 03/28/16 1948 03/29/16 0853   03/28/16 2000  metroNIDAZOLE (FLAGYL) IVPB 500 mg     500 mg 100 mL/hr over 60 Minutes Intravenous Every 8 hours 03/28/16 1948  03/28/16 1215  ceFEPIme (MAXIPIME) 2 g in dextrose 5 % 50 mL IVPB     2 g 100 mL/hr over 30 Minutes Intravenous  Once 03/28/16 1213 03/28/16 1259   03/28/16 1215  metroNIDAZOLE (FLAGYL) IVPB 500 mg     500 mg 100 mL/hr over 60 Minutes Intravenous  Once 03/28/16 1213 03/28/16 1500     Assessment/Plan s/p Procedure(s): EXPLORATORY LAPAROTOMY, SIGMOID COLECTOMY, COLOSTOMY (N/A), 03/28/16 POD#6  Post-operative ileus   Continue physical therapy Continue dressing changes Continue rocephin and flagyl for perforated colon  Dispo at discharge: SNF   Hypokalemia - 3.3; will replace   Dispo - resolving ileus. keep g-tube clamped. Continue clears. Possible advancement of diet this afternoon vs tomorrow.   LOS: 6 days    [facility] 04/03/2016, 7:36 AM Pager:  [phone] Consults: [phone] Mon-Fri 7:00 am-4:30 pm Sat-Sun 7:00 am-11:30 am

## 2016-04-03 NOTE — Progress Notes (Signed)
Nutrition Follow-up  DOCUMENTATION CODES:   Not applicable  INTERVENTION:  - Diet advancement as medically feasible. - Will order Ensure Enlive TID, each supplement provides 350 kcal and 20 grams of protein - Continue Boost Breeze TID while pt is on CLD. - RD will continue to monitor for nutrition-related needs.  NUTRITION DIAGNOSIS:   Inadequate oral intake related to inability to eat as evidenced by NPO status. -slightly improved with CLD.  GOAL:   Patient will meet greater than or equal to 90% of their needs -unmet  MONITOR:   PO intake, Supplement acceptance, Diet advancement, Weight trends, Labs, Skin, I & O's  ASSESSMENT:   65 y.o. M with a h/o liver cirrhosis and tonsilar cancer, s/p XRT completed in May 17. Reports several days of intermittent nausea followed by severe, acute onset, abdominal pain this am. Denies any fevers. Reported to [facility] ED and was found to have diffuse peritonitis and free air on CT scan. Was transferred here due to h/o radiation to the neck and no anesthesiologist on call at [facility]. Pt takes chronic narcotics and has been weaning off these recently and increasing his ibuprofen use.  11/10 Notes indicate pt with intermittent nausea since previous assessment. He was very nauseated yesterday with gas and abdominal distention necessitating unclamping of G-tube. Pt states that G-tube has been re-clamped since ~0115 this AM without issue. He was sitting in the chair with CLD breakfast tray in front of him and states he has slowly been working on this meal. He denies abdominal pain or nausea at time of visit.   Pt is currently ordered Boost Breeze TID and he has been consuming this item except during times of nausea. Per Surgery note this AM, possible diet advancement this afternoon versus tomorrow. Will order Ensure Enlive TID in anticipation. Pt was drinking Ensure and Boost PTA. No new weight since 03/28/16.  Medications reviewed; PRN Zofran. Labs reviewed; K:  3.3 mmol/L, creatinine: 0.56 mg/dL, Ca: 7.6 mg/dL.  IVF: D5-NS-20 mEq KCl @ 100 mL/hr (408 kcal).    11/8 - Diet advanced from NPO to CLD yesterday at 0904 with no intakes documented yesterday.  - This AM pt was able to consume coffee, juice, and a few small sips of broth.  - He states he is having severe abdominal pain and that it was causing nausea.  - He is unsure if abdominal pain was worsened by PO intakes but states "it certainly did not make it better."  - PEG has been clamped since yesterday per surgery.   IVF: D5-NS-20 mEq KCl @ 100 mL/hr (408 kcal).   11/6 - Pt has been NPO since admission.  - He is POD #2 ex lap with sigmoid colectomy and end colostomy.  - Pt taking sips of water throughout the day today and is very eager for diet advancement.  - Last radiation treatment was in May 2017.  - He has a PEG and was previously using 6-8 cans of Jevity 1.2/day but has not used PEG in the past 1 month as PO intakes have improved/increased.  - He was consuming a wide variety of foods/food groups PTA and had been working with SLP and RD at [facility].  - Oral nutrition supplements included both Ensure and Boost.  - He sometimes experiences coughing and pain with PO intakes but that since last radiation treatment this has gradually improved.  - Pt states that his weight over the past 1 month has remained stable at ~150 lbs.  -  He reports that max weight over the past 1 month was 154 lbs and at that time he was informed that if he could maintain this higher weight then PEG could be removed.  - Per chart review, weight has been stable (150-154 lbs) over the past 4 months.  - No muscle or fat wasting noted during physical assessment.  - Pt denies any abdominal pain or nausea at time of RD visit; noted that pt reported nausea this AM.  - He states that he is very hungry and requests diet advancement as soon as is medically feasible.   IVF: NS @ 125 mL/hr.    Diet  Order:  Diet clear liquid Room service appropriate? Yes; Fluid consistency: Thin  Skin:  Wound (see comment) (Abdominal incision from 03/28/16)  Last BM:  11/9  Height:   Ht Readings from Last 1 Encounters:  03/28/16 5\' 10"  (1.778 m)    Weight:   Wt Readings from Last 1 Encounters:  03/28/16 150 lb (68 kg)    Ideal Body Weight:  75.45 kg  BMI:  Body mass index is 21.52 kg/m.  Estimated Nutritional Needs:   Kcal:  [id number] (30-33 kcal/kg)  Protein:  82-95 grams (1.2-1.5 grams/kg)  Fluid:  >/= 2 L/day  EDUCATION NEEDS:   No education needs identified at this time    Jarome Matin, MS, RD, LDN Inpatient Clinical Dietitian Pager # [phone] After hours/weekend pager # [phone]

## 2016-04-03 NOTE — Consult Note (Signed)
[facility] Nurse ostomy follow up Stoma type/location: LLQ end colostomy Stomal assessment/size: 1 and 3/4 inches round, red, moist, budded, edematous.  Os at center Peristomal assessment: Intact, clear Treatment options for stomal/peristomal skin: Skin barrier ring with additional ring placed from 6-12 o'clock Output: green/brown stool Ostomy pouching: 2pc. 2 and 3/4 pouching system with 1.5 skin barrier ring Education provided: Extended session with (different) sister present.  Observed pouch removal, stoma sizing, pouch preparation, application.  Asking questions and appreciative of answers.  Has ready book.  Patient able to recall basic A&P, pouching procedure nuances.  Also familiar with Lock and Roll closure. Enrolled patient in [place] Start Discharge program: Yes [facility] nursing team will follow, and will remain available to this patient, the nursing, surgical and medical teams.  Thanks, Maudie Flakes, MSN, RN, [place], Arther Abbott  Pager# [phone]

## 2016-04-04 ENCOUNTER — Inpatient Hospital Stay (HOSPITAL_COMMUNITY): Payer: BLUE CROSS/BLUE SHIELD

## 2016-04-04 LAB — URINALYSIS, ROUTINE W REFLEX MICROSCOPIC
Bilirubin Urine: NEGATIVE
Glucose, UA: NEGATIVE mg/dL
Hgb urine dipstick: NEGATIVE
KETONES UR: NEGATIVE mg/dL
LEUKOCYTES UA: NEGATIVE
NITRITE: NEGATIVE
PROTEIN: NEGATIVE mg/dL
Specific Gravity, Urine: 1.015 (ref 1.005–1.030)
pH: 6.5 (ref 5.0–8.0)

## 2016-04-04 MED ORDER — ACETAMINOPHEN 160 MG/5ML PO SOLN
650.0000 mg | Freq: Four times a day (QID) | ORAL | Status: DC | PRN
  Administered 2016-04-05 (×2): 650 mg via ORAL
  Filled 2016-04-04 (×2): qty 20.3

## 2016-04-04 NOTE — Progress Notes (Signed)
Dr. Marlou Starks aware via phone pt running fever of 101.5 this evening. No specific complaints. Using IS q1h. See new orders received from MD.

## 2016-04-04 NOTE — Clinical Social Work Note (Signed)
BCBS authorization obtained. Patient has private room at [facility] once medically stable for discharge.   EDD: early next week.   MSW will continue to follow patient and pt's family for continued support and to facilitate patient's discharge needs once medically stable.   Glendon Axe, MSW [phone] 04/04/2016 12:00 PM

## 2016-04-04 NOTE — Progress Notes (Signed)
Pt complained of pain in Left upper arm . Red warm, hard area that is tender to touch assessed. MD notified. Orders to apply warm compress and duplex ordered.  Rosie Fate

## 2016-04-04 NOTE — Progress Notes (Signed)
Patient ID: Frank Castillo, male   DOB: 21-Mar-1951, 65 y.o.   MRN: [id number]  7 Days Post-Op  Subjective: Says he feels better. Less pain. He did have to unclamp his gastrostomy last night due to nausea. Having some increased ostomy output.  Objective: Vital signs in last 24 hours: Temp:  [98.3 F (36.8 C)-98.9 F (37.2 C)] 98.3 F (36.8 C) (11/11 0528) Pulse Rate:  [84-97] 86 (11/11 0528) Resp:  [13-20] 16 (11/11 0528) BP: (141-152)/(79-87) 143/79 (11/11 0528) SpO2:  [95 %-97 %] 97 % (11/11 0528) Last BM Date: 04/02/16  Intake/Output from previous day: 11/10 0701 - 11/11 0700 In: 2940 [P.O.:240; I.V.:2400; IV Piggyback:300] Out: 3150 [Urine:1850; Drains:325; Stool:975] Intake/Output this shift: No intake/output data recorded.  General appearance: alert, cooperative and no distress GI: normal findings: soft, non-tender and Nondistended with some liquid ostomy output Incision/Wound: Repacked. Minor exudate at the base.  Lab Results:   Recent Labs  04/02/16 0445 04/03/16 0500  WBC 7.2 8.9  HGB 8.9* 8.9*  HCT 26.1* 27.4*  PLT 133* 155   BMET  Recent Labs  04/02/16 0445 04/03/16 0500  NA 143 138  K 3.0* 3.3*  CL 110 108  CO2 27 26  GLUCOSE 119* 109*  BUN 13 10  CREATININE 0.56* 0.56*  CALCIUM 7.8* 7.6*     Studies/Results: No results found.  Anti-infectives: Anti-infectives    Start     Dose/Rate Route Frequency Ordered Stop   03/29/16 2000  cefTRIAXone (ROCEPHIN) 2 g in dextrose 5 % 50 mL IVPB     2 g 100 mL/hr over 30 Minutes Intravenous Every 24 hours 03/29/16 0853     03/28/16 2000  cefTRIAXone (ROCEPHIN) 1 g in dextrose 5 % 50 mL IVPB  Status:  Discontinued     1 g 100 mL/hr over 30 Minutes Intravenous Every 24 hours 03/28/16 1948 03/29/16 0853   03/28/16 2000  metroNIDAZOLE (FLAGYL) IVPB 500 mg     500 mg 100 mL/hr over 60 Minutes Intravenous Every 8 hours 03/28/16 1948     03/28/16 1215  ceFEPIme (MAXIPIME) 2 g in dextrose 5 % 50 mL IVPB     2 g 100 mL/hr over 30 Minutes Intravenous  Once 03/28/16 1213 03/28/16 1259   03/28/16 1215  metroNIDAZOLE (FLAGYL) IVPB 500 mg     500 mg 100 mL/hr over 60 Minutes Intravenous  Once 03/28/16 1213 03/28/16 1500      Assessment/Plan: s/p Procedure(s): EXPLORATORY LAPAROTOMY, SIGMOID COLECTOMY, COLOSTOMY Continuing IV antibiotics for peritonitis Apparent ileus requiring periodic gastrostomy drainage. Continue keeping clamped as tolerated and sips of clear liquids Encouraged to be out of bed today.   LOS: 7 days    Frank Castillo 04/04/2016

## 2016-04-05 ENCOUNTER — Inpatient Hospital Stay (HOSPITAL_COMMUNITY): Payer: BLUE CROSS/BLUE SHIELD

## 2016-04-05 DIAGNOSIS — M7989 Other specified soft tissue disorders: Secondary | ICD-10-CM

## 2016-04-05 LAB — CBC
HCT: 25.1 % — ABNORMAL LOW (ref 39.0–52.0)
HEMOGLOBIN: 8.5 g/dL — AB (ref 13.0–17.0)
MCH: 28.9 pg (ref 26.0–34.0)
MCHC: 33.9 g/dL (ref 30.0–36.0)
MCV: 85.4 fL (ref 78.0–100.0)
Platelets: 213 10*3/uL (ref 150–400)
RBC: 2.94 MIL/uL — AB (ref 4.22–5.81)
RDW: 14.7 % (ref 11.5–15.5)
WBC: 7.9 10*3/uL (ref 4.0–10.5)

## 2016-04-05 LAB — BASIC METABOLIC PANEL
Anion gap: 5 (ref 5–15)
BUN: 8 mg/dL (ref 6–20)
CHLORIDE: 102 mmol/L (ref 101–111)
CO2: 26 mmol/L (ref 22–32)
CREATININE: 0.63 mg/dL (ref 0.61–1.24)
Calcium: 7.4 mg/dL — ABNORMAL LOW (ref 8.9–10.3)
GFR calc Af Amer: >60 mL/min (ref >60)
GFR calc non Af Amer: >60 mL/min (ref >60)
GLUCOSE: 122 mg/dL — AB (ref 65–99)
POTASSIUM: 3.4 mmol/L — AB (ref 3.5–5.1)
SODIUM: 133 mmol/L — AB (ref 135–145)

## 2016-04-05 MED ORDER — METRONIDAZOLE 50 MG/ML ORAL SUSPENSION
500.0000 mg | Freq: Three times a day (TID) | ORAL | Status: DC
  Administered 2016-04-05 – 2016-04-08 (×8): 500 mg via ORAL
  Filled 2016-04-05 (×9): qty 10

## 2016-04-05 NOTE — Consult Note (Signed)
[facility] Nurse ostomy follow up Stoma type/location: LLQ end colostomy. Leaked at lunchtime today toward incision. Pouching system changed using same technique employed Friday.  Currently intact. New complaint of rash on back and I am not sure if this is a medication related response.  Note for [facility] MD/PA to assess in am. Stomal assessment/size: 1 and 3/4 inches round. Peristomal assessment: Not seen today Treatment options for stomal/peristomal skin: 1.5 skin barrier rings Output brown/green liquid effluent. Ostomy pouching: 2pc. With skin barrier rings above Education provided: Patient is independent with stand by assist for emptying pouch and closing Lock and Roll closure.  He is cleaning out with toilet paper wicks with minimal cueing. Enrolled patient in [place] Start Discharge program: Yes [facility] nursing team will follow, and will remain available to this patient, the nursing, surgical and medical teams.  Thanks, Maudie Flakes, MSN, RN, [place], Arther Abbott  Pager# [phone]

## 2016-04-05 NOTE — Progress Notes (Signed)
**  Preliminary report by tech**  Left upper extremity venous duplex complete. There is evidence of acute superficial vein thrombosis involving the cephalic vein in the proximal to distal upper arm of the left upper extremity.  There is no evidence of deep vein thrombosis involving the left upper extremity.  Results were given to the patient's nurse, Nevin Bloodgood.   04/05/16 10:08 AM Frank Castillo RVT

## 2016-04-05 NOTE — Progress Notes (Signed)
8 Days Post-Op  Subjective: General he feels better. States he unclamped his tube once last night for nausea but drinking more liquids and has a lot of ostomy output. Had one brief episode of fever. Red and swollen area noted on left upper arm which is slightly tender.  Objective: Vital signs in last 24 hours: Temp:  [98.1 F (36.7 C)-101.5 F (38.6 C)] 98.1 F (36.7 C) (11/12 0445) Pulse Rate:  [72-95] 85 (11/12 0445) Resp:  [12-17] 16 (11/12 0445) BP: (124-152)/(74-84) 124/74 (11/12 0445) SpO2:  [95 %-100 %] 97 % (11/12 0445) Last BM Date: 04/02/16  Intake/Output from previous day: 11/11 0701 - 11/12 0700 In: 2700 [I.V.:2400; IV Piggyback:300] Out: 3225 [Urine:2275; Stool:950] Intake/Output this shift: No intake/output data recorded.  General appearance: alert, cooperative and no distress GI: normal findings: soft, non-tender and Nondistended Extremities: 4-5 cm area of erythema and induration over distal left biceps above antecubital fossa which appears superficial Incision/Wound: Dressing intact, wound examined yesterday  Lab Results:   Recent Labs  04/03/16 0500 04/05/16 0359  WBC 8.9 7.9  HGB 8.9* 8.5*  HCT 27.4* 25.1*  PLT 155 213   BMET  Recent Labs  04/03/16 0500 04/05/16 0359  NA 138 133*  K 3.3* 3.4*  CL 108 102  CO2 26 26  GLUCOSE 109* 122*  BUN 10 8  CREATININE 0.56* 0.63  CALCIUM 7.6* 7.4*     Studies/Results: Dg Abd Acute W/chest  Result Date: 04/04/2016 CLINICAL DATA:  Fever, tobacco use and hypertension. History of cirrhosis. EXAM: DG ABDOMEN ACUTE W/ 1V CHEST COMPARISON:  PET-CT 07/30/2015 FINDINGS: The heart is top-normal in size. There is aortic atherosclerosis. Lungs mildly hyperinflated with emphysematous changes bilaterally. Port catheter tip is seen in the distal SVC. Dilated small bowel loops out of proportion to large bowel are seen in a pattern consistent with high-grade small bowel obstruction. Gastrostomy tube projects over the  left quadrant. Left lower quadrant colostomy is also seen. There is no free air. IMPRESSION: Abnormal bowel gas pattern consistent with high-grade small bowel obstruction. Port catheter tip in the distal SVC. Gastrostomy tube tip in the left upper quadrant. Left lower quadrant colostomy. Aortic atherosclerosis. Mild hyperinflation  lungs without acute pulmonary consolidation. Electronically Signed   By: Ashley Royalty M.D.   On: 04/04/2016 22:13    Anti-infectives: Anti-infectives    Start     Dose/Rate Route Frequency Ordered Stop   03/29/16 2000  cefTRIAXone (ROCEPHIN) 2 g in dextrose 5 % 50 mL IVPB     2 g 100 mL/hr over 30 Minutes Intravenous Every 24 hours 03/29/16 0853     03/28/16 2000  cefTRIAXone (ROCEPHIN) 1 g in dextrose 5 % 50 mL IVPB  Status:  Discontinued     1 g 100 mL/hr over 30 Minutes Intravenous Every 24 hours 03/28/16 1948 03/29/16 0853   03/28/16 2000  metroNIDAZOLE (FLAGYL) IVPB 500 mg     500 mg 100 mL/hr over 60 Minutes Intravenous Every 8 hours 03/28/16 1948     03/28/16 1215  ceFEPIme (MAXIPIME) 2 g in dextrose 5 % 50 mL IVPB     2 g 100 mL/hr over 30 Minutes Intravenous  Once 03/28/16 1213 03/28/16 1259   03/28/16 1215  metroNIDAZOLE (FLAGYL) IVPB 500 mg     500 mg 100 mL/hr over 60 Minutes Intravenous  Once 03/28/16 1213 03/28/16 1500      Assessment/Plan: s/p Procedure(s): EXPLORATORY LAPAROTOMY, SIGMOID COLECTOMY, COLOSTOMY Overall improved. Ileus seems to be resolving. Advance  to full liquid diet. Appears to have a superficial phlebitis left arm. Duplex pending. Elevate with warm compresses.   LOS: 8 days    Kimba Lottes T 11/12/2017Patient ID: Frank Castillo, male   DOB: 09-Oct-1950, 65 y.o.   MRN: [id number]

## 2016-04-05 NOTE — Progress Notes (Signed)
Dr. Excell Seltzer aware via phone of the results in preliminary report from cardiovascular sonographer this am. No new orders received.

## 2016-04-06 MED ORDER — FENTANYL 12 MCG/HR TD PT72
12.5000 ug | MEDICATED_PATCH | TRANSDERMAL | Status: DC
  Administered 2016-04-06: 12.5 ug via TRANSDERMAL

## 2016-04-06 MED ORDER — OXYCODONE HCL 5 MG PO TABS
5.0000 mg | ORAL_TABLET | ORAL | Status: DC | PRN
  Administered 2016-04-06 – 2016-04-08 (×12): 10 mg via ORAL
  Filled 2016-04-06 (×12): qty 2

## 2016-04-06 NOTE — Progress Notes (Signed)
[facility] Progress Note  9 Days Post-Op  Subjective: C/o pain in his lower right back. Rates abdominal pain 6/10. Tolerating full liquids, however not eating much secondary to dry mouth - feels that ensure gets stuck and coats his mouth because his makes minimal saliva after his radiation therapy. He unclamped his g-tube once last night due to nausea. Ambulated a lot with PT yesterday.  New, pruritic rash over back - denies history of contact dermatitis. No new medications started in the past couple of days.   Denies vomiting. Denies dysuria/hematuria.   Objective: Vital signs in last 24 hours: Temp:  [97.8 F (36.6 C)-99.3 F (37.4 C)] 98 F (36.7 C) (11/13 0427) Pulse Rate:  [80-104] 85 (11/13 0427) Resp:  [12-18] 13 (11/13 0427) BP: (102-139)/(66-88) 133/66 (11/13 0427) SpO2:  [96 %-99 %] 98 % (11/13 0427) Last BM Date: 04/02/16  Intake/Output from previous day: 11/12 0701 - 11/13 0700 In: 2400 [I.V.:2400] Out: 3350 [Urine:2150; Drains:150; Stool:1050] Intake/Output this shift: No intake/output data recorded.  PE: Gen:  Alert, NAD, pleasant and cooperative Pulm:  CTABL, no W/R/R Abd: Soft, NT/ND, +BS, ostomy pouch with good seal - liquid stool present  Wound - healing appropriately - some yellow slough at wound base that I debrided at bedside.  Ext:  Erythema over left bicep stable - mild erythema with about 3 cm induration Skin: red pustular rash over back with some excoriations from scratching. No drainage.   Lab Results:   Recent Labs  04/05/16 0359  WBC 7.9  HGB 8.5*  HCT 25.1*  PLT 213   BMET  Recent Labs  04/05/16 0359  NA 133*  K 3.4*  CL 102  CO2 26  GLUCOSE 122*  BUN 8  CREATININE 0.63  CALCIUM 7.4*   CMP     Component Value Date/Time   NA 133 (L) 04/05/2016 0359   NA 132 (L) 10/21/2011 1614   K 3.4 (L) 04/05/2016 0359   K 3.6 10/21/2011 1614   CL 102 04/05/2016 0359   CL 92 (L) 10/21/2011 1614   CO2 26 04/05/2016 0359    CO2 32 10/21/2011 1614   GLUCOSE 122 (H) 04/05/2016 0359   GLUCOSE 105 (H) 10/21/2011 1614   BUN 8 04/05/2016 0359   BUN 7 10/21/2011 1614   CREATININE 0.63 04/05/2016 0359   CREATININE 0.73 10/21/2011 1614   CALCIUM 7.4 (L) 04/05/2016 0359   CALCIUM 7.9 (L) 10/21/2011 1614   PROT 6.6 03/28/2016 0824   PROT 6.0 (L) 10/21/2011 1614   ALBUMIN 3.9 03/28/2016 0824   ALBUMIN 2.2 (L) 10/21/2011 1614   AST 20 03/28/2016 0824   AST 97 (H) 10/21/2011 1614   ALT 13 (L) 03/28/2016 0824   ALT 34 10/21/2011 1614   ALKPHOS 54 03/28/2016 0824   ALKPHOS 83 10/21/2011 1614   BILITOT 0.6 03/28/2016 0824   BILITOT 1.1 (H) 10/21/2011 1614   GFRNONAA >60 04/05/2016 0359   GFRNONAA >60 10/21/2011 1614   GFRAA >60 04/05/2016 0359   GFRAA >60 10/21/2011 1614   Lipase     Component Value Date/Time   LIPASE 39 03/28/2016 0824   LIPASE 123 10/21/2011 1614   Studies/Results: Dg Abd Acute W/chest  Result Date: 04/04/2016 CLINICAL DATA:  Fever, tobacco use and hypertension. History of cirrhosis. EXAM: DG ABDOMEN ACUTE W/ 1V CHEST COMPARISON:  PET-CT 07/30/2015 FINDINGS: The heart is top-normal in size. There is aortic atherosclerosis. Lungs mildly hyperinflated with emphysematous changes bilaterally. Port catheter tip is seen in  the distal SVC. Dilated small bowel loops out of proportion to large bowel are seen in a pattern consistent with high-grade small bowel obstruction. Gastrostomy tube projects over the left quadrant. Left lower quadrant colostomy is also seen. There is no free air. IMPRESSION: Abnormal bowel gas pattern consistent with high-grade small bowel obstruction. Port catheter tip in the distal SVC. Gastrostomy tube tip in the left upper quadrant. Left lower quadrant colostomy. Aortic atherosclerosis. Mild hyperinflation  lungs without acute pulmonary consolidation. Electronically Signed   By: Ashley Royalty M.D.   On: 04/04/2016 22:13    Anti-infectives: Anti-infectives    Start      Dose/Rate Route Frequency Ordered Stop   04/05/16 2200  metroNIDAZOLE (FLAGYL) 50 mg/ml oral suspension 500 mg     500 mg Oral 3 times daily 04/05/16 1242     03/29/16 2000  cefTRIAXone (ROCEPHIN) 2 g in dextrose 5 % 50 mL IVPB     2 g 100 mL/hr over 30 Minutes Intravenous Every 24 hours 03/29/16 0853     03/28/16 2000  cefTRIAXone (ROCEPHIN) 1 g in dextrose 5 % 50 mL IVPB  Status:  Discontinued     1 g 100 mL/hr over 30 Minutes Intravenous Every 24 hours 03/28/16 1948 03/29/16 0853   03/28/16 2000  metroNIDAZOLE (FLAGYL) IVPB 500 mg  Status:  Discontinued     500 mg 100 mL/hr over 60 Minutes Intravenous Every 8 hours 03/28/16 1948 04/05/16 1242   03/28/16 1215  ceFEPIme (MAXIPIME) 2 g in dextrose 5 % 50 mL IVPB     2 g 100 mL/hr over 30 Minutes Intravenous  Once 03/28/16 1213 03/28/16 1259   03/28/16 1215  metroNIDAZOLE (FLAGYL) IVPB 500 mg     500 mg 100 mL/hr over 60 Minutes Intravenous  Once 03/28/16 1213 03/28/16 1500     Assessment/Plan Perforated sigmoid colon s/p Procedure(s): EXPLORATORY LAPAROTOMY, SIGMOID COLECTOMY, COLOSTOMY  Post-op ileus resolving   Continue full liquids, encourage patient to keep G-tube clamped  D/c PCA - fentanyl patch and oxycodone IR PRN  Daily wet-to-dry dressing changes  FEN: full liquids ID: Rocephin/Flagyl 11/5>> VTE: Lovenox, SCD's  Dispo: advance diet as tolerated, local wound care  SNF at discharge    LOS: 9 days    Jill Alexanders , [facility] Surgery 04/06/2016, 7:45 AM Pager: [phone] Consults: [phone] Mon-Fri 7:00 am-4:30 pm Sat-Sun 7:00 am-11:30 am

## 2016-04-06 NOTE — Consult Note (Signed)
[facility] Nurse ostomy follow up Stoma type/location: Patient's nurse contacted me because she felt as if the ostomy pouch was lifting at the medial edge, toward the incision.  No leakage. Stomal assessment/size: 1 and 3/4 inches round, budded, moist, os at center. Peristomal assessment: intact, clear Treatment options for stomal/peristomal skin: skin barrier ring Output brown liquid effluent Ostomy pouching: 1pc.flat pouch with skin barrier ring (single).  Pouch skin barrier cut off-center and tape trimmed slightly to accommodate wound. Education provided: patient and sister observing change in pouching method. Enrolled patient in [place] Start Discharge program: Yes.  If new system works, will phone to change supply order. [facility] nursing team will follow, and will remain available to this patient, the nursing and medical teams.  Thanks, Maudie Flakes, MSN, RN, [place], Arther Abbott  Pager# [phone]

## 2016-04-06 NOTE — Progress Notes (Signed)
Physical Therapy Treatment Patient Details Name: Frank Castillo MRN: [id number] DOB: 11-13-1950 Today's Date: 04/06/2016    History of Present Illness 65 yo male admitted with perforated sigmoid colon. 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 Hx of liver cirrhosis, tonsillar cancer.     PT Comments    Pt OOB in recliner.  Assisted with amb a limited distance due to pain and mild c/o dizziness.  Very unsteady gait with limited distance.   Pt will need ST Rehab at SNF to regain prior level of mobility.  Follow Up Recommendations  SNF     Equipment Recommendations       Recommendations for Other Services       Precautions / Restrictions Precautions Precautions: Fall Precaution Comments: multiple lines/leads, colostomy, jejunostomy Restrictions Weight Bearing Restrictions: No    Mobility  Bed Mobility               General bed mobility comments: NT OOB in recliner  Transfers Overall transfer level: Needs assistance Equipment used: Rolling walker (2 wheeled) Transfers: Sit to/from Stand;Stand Pivot Transfers Sit to Stand: +2 safety/equipment;Min assist         General transfer comment: VC's for UE placement and to reach back for the recliner before sitting.   Def use of B UE's to steady self  Ambulation/Gait Ambulation/Gait assistance: Min assist;+2 safety/equipment Ambulation Distance (Feet): 32 Feet Assistive device: Rolling walker (2 wheeled)   Gait velocity: decreased   General Gait Details: very unsteady gait with excessive WBing thru walker due to pain.  Mild c/o dizziness.  weak.    Stairs            Wheelchair Mobility    Modified Rankin (Stroke Patients Only)       Balance                                    Cognition Arousal/Alertness: Awake/alert Behavior During Therapy: WFL for tasks assessed/performed Overall Cognitive Status: Within Functional Limits for tasks assessed                       Exercises      General Comments        Pertinent Vitals/Pain Pain Assessment: 0-10 Pain Score: 6  Pain Location: ABD Pain Descriptors / Indicators: Discomfort;Grimacing;Sore Pain Intervention(s): Monitored during session    Home Living                      Prior Function            PT Goals (current goals can now be found in the care plan section) Progress towards PT goals: Progressing toward goals    Frequency    Min 3X/week      PT Plan Current plan remains appropriate    Co-evaluation             End of Session Equipment Utilized During Treatment: Gait belt Activity Tolerance: Patient limited by fatigue;Patient limited by pain Patient left: with call bell/phone within reach;with family/visitor present;with bed alarm set;in chair     Time: [id number] PT Time Calculation (min) (ACUTE ONLY): 26 min  Charges:  $Gait Training: 8-22 mins $Therapeutic Activity: 8-22 mins                    G Codes:     Rica Koyanagi  PTA [facility]  Acute  Rehab Pager      [phone]

## 2016-04-06 NOTE — Consult Note (Cosign Needed)
[facility] Nurse ostomy follow up Stoma type/location: LLQ end colostomy. Pouch applied yesterday is intact. Planned pouch change tomorrow. Stomal assessment/size: 1 and 3/4 inches round Peristomal assessment: Not seen today Treatment options for stomal/peristomal skin: 1.5 skin barrier rings Output: brown effluent Ostomy pouching: 2-piece with 1.5 skin barrier rings Education provided: Patient's biggest complaint today is back pain.  Is using heating pad, muscle relaxant, sitting in chair on pressure redistribution chair pad and today we will try a mattress replacement with low air loss feature. Enrolled patient in [place] Start Discharge program: Yes [facility] nursing team will follow, and will remain available to this patient, the nursing, surgical and medical teams.   Thanks, Maudie Flakes, MSN, RN, [place], Arther Abbott  Pager# [phone]

## 2016-04-06 NOTE — Progress Notes (Signed)
Pt has a private room available at [facility] pending medical clearance. Insurance auth obtained. CSW will follow.  Wandra Feinstein, MSW, LCSW [phone]  (coverage)

## 2016-04-07 ENCOUNTER — Inpatient Hospital Stay (HOSPITAL_COMMUNITY): Payer: BLUE CROSS/BLUE SHIELD

## 2016-04-07 LAB — URINALYSIS, ROUTINE W REFLEX MICROSCOPIC
Bilirubin Urine: NEGATIVE
Glucose, UA: NEGATIVE mg/dL
HGB URINE DIPSTICK: NEGATIVE
Ketones, ur: NEGATIVE mg/dL
LEUKOCYTES UA: NEGATIVE
NITRITE: NEGATIVE
PROTEIN: NEGATIVE mg/dL
SPECIFIC GRAVITY, URINE: 1.008 (ref 1.005–1.030)
pH: 6 (ref 5.0–8.0)

## 2016-04-07 LAB — CBC
HEMATOCRIT: 23.9 % — AB (ref 39.0–52.0)
Hemoglobin: 7.9 g/dL — ABNORMAL LOW (ref 13.0–17.0)
MCH: 28.2 pg (ref 26.0–34.0)
MCHC: 33.1 g/dL (ref 30.0–36.0)
MCV: 85.4 fL (ref 78.0–100.0)
PLATELETS: 351 10*3/uL (ref 150–400)
RBC: 2.8 MIL/uL — ABNORMAL LOW (ref 4.22–5.81)
RDW: 14.7 % (ref 11.5–15.5)
WBC: 7.3 10*3/uL (ref 4.0–10.5)

## 2016-04-07 NOTE — Clinical Social Work Note (Signed)
Patient has bed at [facility] once medically stable for discharge. Updated PT notes sent to facility for reauthorization from [facility]. BC[employer] authorization obtained.   MSW remains available as needed.   Glendon Axe, MSW [phone] 04/07/2016 11:46 AM

## 2016-04-07 NOTE — Progress Notes (Signed)
[facility] Progress Note  10 Days Post-Op  Subjective: Abdominal pain controlled - slept well last night.  Reports decreased ostomy output past 24h along with feeling "constipated", however nausea is improved and he did not vent his G-tube at all yesterday. Denies vomiting.  C/o dysuria.   Fever of 101.3 yesterday late afternoon, followed by low grade temp 100.3 - denies increased abdominal pain, CP, productive cough, and night sweats.   Objective: Vital signs in last 24 hours: Temp:  [98.1 F (36.7 C)-101.3 F (38.5 C)] 98.1 F (36.7 C) (11/14 0700) Pulse Rate:  [87-93] 88 (11/14 0700) Resp:  [16-18] 16 (11/14 0700) BP: (112-138)/(67-88) 123/67 (11/14 0700) SpO2:  [96 %-100 %] 96 % (11/14 0700) Last BM Date: 04/02/16  Intake/Output from previous day: 11/13 0701 - 11/14 0700 In: 3593.3 [P.O.:1260; I.V.:2273.3] Out: 2050 [Urine:1850; Stool:200]  PE: Gen:  Alert, NAD, pleasant Card:  RRR, no M/G/R heard Pulm:  CTA, no W/R/R Abd: Soft, NT, +BS, no HSM, incisions C/D/I, ostomy pouch with good seal - no gas or stool in pouch.  Lab Results:   Recent Labs  04/05/16 0359  WBC 7.9  HGB 8.5*  HCT 25.1*  PLT 213   BMET  Recent Labs  04/05/16 0359  NA 133*  K 3.4*  CL 102  CO2 26  GLUCOSE 122*  BUN 8  CREATININE 0.63  CALCIUM 7.4*   CMP     Component Value Date/Time   NA 133 (L) 04/05/2016 0359   NA 132 (L) 10/21/2011 1614   K 3.4 (L) 04/05/2016 0359   K 3.6 10/21/2011 1614   CL 102 04/05/2016 0359   CL 92 (L) 10/21/2011 1614   CO2 26 04/05/2016 0359   CO2 32 10/21/2011 1614   GLUCOSE 122 (H) 04/05/2016 0359   GLUCOSE 105 (H) 10/21/2011 1614   BUN 8 04/05/2016 0359   BUN 7 10/21/2011 1614   CREATININE 0.63 04/05/2016 0359   CREATININE 0.73 10/21/2011 1614   CALCIUM 7.4 (L) 04/05/2016 0359   CALCIUM 7.9 (L) 10/21/2011 1614   PROT 6.6 03/28/2016 0824   PROT 6.0 (L) 10/21/2011 1614   ALBUMIN 3.9 03/28/2016 0824   ALBUMIN 2.2 (L)  10/21/2011 1614   AST 20 03/28/2016 0824   AST 97 (H) 10/21/2011 1614   ALT 13 (L) 03/28/2016 0824   ALT 34 10/21/2011 1614   ALKPHOS 54 03/28/2016 0824   ALKPHOS 83 10/21/2011 1614   BILITOT 0.6 03/28/2016 0824   BILITOT 1.1 (H) 10/21/2011 1614   GFRNONAA >60 04/05/2016 0359   GFRNONAA >60 10/21/2011 1614   GFRAA >60 04/05/2016 0359   GFRAA >60 10/21/2011 1614   Lipase     Component Value Date/Time   LIPASE 39 03/28/2016 0824   LIPASE 123 10/21/2011 1614   Anti-infectives: Anti-infectives    Start     Dose/Rate Route Frequency Ordered Stop   04/05/16 2200  metroNIDAZOLE (FLAGYL) 50 mg/ml oral suspension 500 mg     500 mg Oral 3 times daily 04/05/16 1242     03/29/16 2000  cefTRIAXone (ROCEPHIN) 2 g in dextrose 5 % 50 mL IVPB     2 g 100 mL/hr over 30 Minutes Intravenous Every 24 hours 03/29/16 0853     03/28/16 2000  cefTRIAXone (ROCEPHIN) 1 g in dextrose 5 % 50 mL IVPB  Status:  Discontinued     1 g 100 mL/hr over 30 Minutes Intravenous Every 24 hours 03/28/16 1948 03/29/16 0853   03/28/16 2000  metroNIDAZOLE (FLAGYL) IVPB 500 mg  Status:  Discontinued     500 mg 100 mL/hr over 60 Minutes Intravenous Every 8 hours 03/28/16 1948 04/05/16 1242   03/28/16 1215  ceFEPIme (MAXIPIME) 2 g in dextrose 5 % 50 mL IVPB     2 g 100 mL/hr over 30 Minutes Intravenous  Once 03/28/16 1213 03/28/16 1259   03/28/16 1215  metroNIDAZOLE (FLAGYL) IVPB 500 mg     500 mg 100 mL/hr over 60 Minutes Intravenous  Once 03/28/16 1213 03/28/16 1500     Assessment/Plan Perforated sigmoid colon s/p Procedure(s): EXPLORATORY LAPAROTOMY, SIGMOID COLECTOMY, COLOSTOMY             Post-op ileus resolving              Continue full liquids, encourage patient to keep G-tube clamped             pain control - fentanyl patch and oxycodone IR PRN             Daily wet-to-dry dressing changes  Low colostomy output - will check abdominal film  Fever - monitor; tylenol PRN. CXR 11/11 not significant for  consolidation; checking UA though low suspicion for UTI given Rocephin should cover urinary bacteria.  CBC pending   FEN: full liquids ID: Rocephin/Flagyl 11/5>>; possible d/c abx tomorrow, as today is day#10  Dispo: DG abdomen, CBC   continue IV abx and local wound care  SNF in 24-48 h    LOS: 10 days    Jill Alexanders , [facility] Surgery 04/07/2016, 7:29 AM Pager: [phone] Consults: [phone] Mon-Fri 7:00 am-4:30 pm Sat-Sun 7:00 am-11:30 am

## 2016-04-07 NOTE — Progress Notes (Signed)
Physical Therapy Treatment Patient Details Name: Frank Castillo MRN: [id number] DOB: 1951/01/04 Today's Date: 04/07/2016    History of Present Illness 65 yo male admitted with perforated sigmoid colon. 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 Hx of liver cirrhosis, tonsillar cancer.     PT Comments    Patient was seen in bed upon arrival. Performed supine to sit x minA to raise trunk from an elevated HOB. Increased time required. Sit to stand x minA with VC's for safe UE Placement. Pt c/o dizziness during initial sitting . See Orthostatic Values. Gait x 35 ft x min guard. VC's required for awareness of lines and for pacing. Minor complaint of dizziness. Required one sitting rest break. Pt displays unsteady gait with excessive Wbing through UE due to a 6/10 pain in abdomen. Patient is limited by pain, decreased endurance, and weakness.   Follow Up Recommendations  SNF     Equipment Recommendations  Rolling walker with 5" wheels    Recommendations for Other Services       Precautions / Restrictions Precautions Precautions: Fall Precaution Comments: multiple lines/leads, colostomy, jejunostomy    Mobility  Bed Mobility Overal bed mobility: Needs Assistance Bed Mobility: Supine to Sit       Sit to supine: Min assist   General bed mobility comments: minA to lift trunk from elevated HOB. c/o dizziness when initially sitting.  Transfers Overall transfer level: Needs assistance Equipment used: Rolling walker (2 wheeled) Transfers: Sit to/from Stand Sit to Stand: +2 safety/equipment;Min assist         General transfer comment: VC's for UE placement when standing and before sitting.   Ambulation/Gait Ambulation/Gait assistance: +2 safety/equipment;Min guard Ambulation Distance (Feet): 35 Feet Assistive device: Rolling walker (2 wheeled) Gait Pattern/deviations: Step-through pattern;Decreased stride length;Trunk flexed Gait velocity: decreased Gait velocity  interpretation: Below normal speed for age/gender General Gait Details: unsteady gait with WBing through walker d/t 6/10 pain in abdomen. 1 sitting rest break d/t dizziness.  VC's for awareness of lines.    Stairs            Wheelchair Mobility    Modified Rankin (Stroke Patients Only)       Balance                                    Cognition Arousal/Alertness: Awake/alert Behavior During Therapy: WFL for tasks assessed/performed Overall Cognitive Status: Within Functional Limits for tasks assessed                      Exercises      General Comments        Pertinent Vitals/Pain Pain Assessment: 0-10 Pain Score: 6  Pain Location: abdomen Pain Descriptors / Indicators: Discomfort;Grimacing Pain Intervention(s): Limited activity within patient's tolerance;Monitored during session;Repositioned    Home Living                      Prior Function            PT Goals (current goals can now be found in the care plan section) Progress towards PT goals: Progressing toward goals    Frequency    Min 3X/week      PT Plan Current plan remains appropriate    Co-evaluation             End of Session Equipment Utilized During Treatment: Gait belt Activity Tolerance: Patient limited  by fatigue;Patient limited by pain Patient left: with call bell/phone within reach;with family/visitor present;with bed alarm set;in chair      Time:  - 11:21 - 11:37    Charges:    1 gt                   G CodesHall Busing, SPTA [facility] Acute Rehab [phone]  Present and agree with above  Rica Koyanagi  PTA [facility]  Acute  Rehab Pager      [phone]

## 2016-04-08 LAB — CBC
HEMATOCRIT: 22.6 % — AB (ref 39.0–52.0)
HEMOGLOBIN: 7.6 g/dL — AB (ref 13.0–17.0)
MCH: 29.1 pg (ref 26.0–34.0)
MCHC: 33.6 g/dL (ref 30.0–36.0)
MCV: 86.6 fL (ref 78.0–100.0)
Platelets: 401 10*3/uL — ABNORMAL HIGH (ref 150–400)
RBC: 2.61 MIL/uL — AB (ref 4.22–5.81)
RDW: 15.1 % (ref 11.5–15.5)
WBC: 7.1 10*3/uL (ref 4.0–10.5)

## 2016-04-08 MED ORDER — HEPARIN SOD (PORK) LOCK FLUSH 100 UNIT/ML IV SOLN
500.0000 [IU] | INTRAVENOUS | Status: AC | PRN
  Administered 2016-04-08: 500 [IU]

## 2016-04-08 MED ORDER — POLYETHYLENE GLYCOL 3350 17 G PO PACK
17.0000 g | PACK | Freq: Every day | ORAL | Status: DC
  Administered 2016-04-08: 17 g via ORAL
  Filled 2016-04-08: qty 1

## 2016-04-08 NOTE — Clinical Social Work Placement (Signed)
Medical Social Worker facilitated patient discharge including contacting patient family and facility to confirm patient discharge plans.  Clinical information faxed to facility and family agreeable with plan.  MSW arranged ambulance transport via [facility] to [place].  RN to call report prior to discharge.  Medical Social Worker will sign off for now as social work intervention is no longer needed. Please consult [place] again if new need arises.  CLINICAL SOCIAL WORK PLACEMENT  NOTE  Date:  04/08/2016  Patient Details  Name: Frank Castillo MRN: [id number] Date of Birth: [birth year]  Clinical Social Work is seeking post-discharge placement for this patient at the [place] level of care (*CSW will initial, date and re-position this form in  chart as items are completed):  Yes   Patient/family provided with [place] Work Department's list of facilities offering this level of care within the geographic area requested by the patient (or if unable, by the patient's family).  Yes   Patient/family informed of their freedom to choose among providers that offer the needed level of care, that participate in Medicare, Medicaid or managed care program needed by the patient, have an available bed and are willing to accept the patient.  Yes   Patient/family informed of [facility]'s ownership interest in [facility] and [facility], as well as of the fact that they are under no obligation to receive care at these facilities.  PASRR submitted to EDS on 04/02/16     PASRR number received on 04/02/16     Existing PASRR number confirmed on       FL2 transmitted to all facilities in geographic area requested by pt/family on 04/02/16     FL2 transmitted to all facilities within larger geographic area on       Patient informed that his/her managed care company has contracts with or will negotiate with certain facilities, including the following:        Yes    Patient/family informed of bed offers received.  Patient chooses bed at  ([facility] )     Physician recommends and patient chooses bed at      Patient to be transferred to  [facility] ) on 04/08/16.  Patient to be transferred to facility by  [facility] )     Patient family notified on 04/08/16 of transfer.  Name of family member notified:   (Pt's sister, Eustaquio Maize )     PHYSICIAN Please sign FL2, Please prepare priority discharge summary, including medications     Additional Comment:    _______________________________________________ [name] 04/08/2016, 10:54 AM

## 2016-04-08 NOTE — Discharge Summary (Signed)
[facility] Discharge Summary   Patient ID: Frank Castillo MRN: [id number] DOB/AGE: 10-11-1950 65 y.o.  Admit date: 03/28/2016 Discharge date: 04/08/2016  Admitting Diagnosis: Perforated sigmoid colon  Discharge Diagnosis Patient Active Problem List   Diagnosis Date Noted  . Perforated sigmoid colon ([place]) 03/28/2016  . Reactive depression (situational) 02/29/2016  . 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 Tobacco use disorder 02/27/2016  . Tinnitus 02/27/2016  . Dysuria 10/25/2015  . Decreased oral intake 10/25/2015  . Malnutrition of moderate degree 10/19/2015  . Antineoplastic chemotherapy induced pancytopenia ([place]) 10/18/2015  . Fever   . Candidiasis of mouth and esophagus ([place]) 10/17/2015  . Neutropenia, febrile ([place]) 10/17/2015  . Shingles rash 10/17/2015  . Nausea with vomiting 09/20/2015  . Hyponatremia 09/20/2015  . Oral pharyngeal candidiasis 09/20/2015  . Antineoplastic chemotherapy induced anemia 09/20/2015  . Hypertension   . Cirrhosis ([place])   . Portal hypertension ([place])   . Cancer of tonsillar fossa ([place]) 08/02/2015   Consultants Wound ostomy care - Margarita Grizzle McNichol  Imaging: Dg Abd 2 Views  Result Date: 04/07/2016 CLINICAL DATA:  Fever, obstipation EXAM: ABDOMEN - 2 VIEW COMPARISON:  04/04/2016 FINDINGS: Gastrostomy tube projects over the left upper abdomen. Gaseous distention of small bowel loops again noted with air-fluid levels compatible with small bowel obstruction. No significant change since prior study. No free air organomegaly. No acute bony abnormality. IMPRESSION: Small bowel obstruction pattern, unchanged. Electronically Signed   By: Rolm Baptise M.D.   On: 04/07/2016 10:50   Procedures [id number] - Dr Leighton Ruff - EXPLORATORY LAPAROTOMY, SIGMOID COLECTOMY, END COLOSTOMY  Hospital Course:  65 y/o male with PMH liver cirrhosis and tonsilar cancer s/p XRT who presented to [facility] ED with intermittent nausea followed by  severe, acute-onset abdominal pain. Found to have peritonitis and CT significant for free air. Was transferred to [facility] due to h/o radiation to the heck and no anesthesiologist on call at [facility]. Patient was admitted, started on IV antibiotics, and underwent procedure listed above. Tolerated procedure well and was transferred to the ICU. Experienced hypotension post-operatively that improved without the use of pressor medications. Daily wet to dry dressing changes were initiated on POD#2. Foley was discontinued on POD #3. Wound ostomy care was consulted for ostomy education and recommendations. Diet was advanced as tolerated. Patient did experience nausea secondary to a post-operative ileus. Nausea resolved when his gastric tube was unclamped and placed to gravity. Eventually the patient was able to keep his gastric tube clamped and control nausea with zofran only. On POD#11, the patient was voiding well, tolerating diet, ambulating well, pain well controlled, vital signs stable, incisions c/d/i and felt stable for discharge home. He completed 10 total days of IV antibiotics and his white blood cell count is within normal limits. He will be discharged to a skilled nursing facility for wound care and physical therapy. Should continue current pain medication regimen, tapering down pain medications as able, and miralax as needed. Patient will follow up in our office with Dr. Marcello Moores in 2 weeks and knows to call with questions or concerns.  Physical Exam: General:  Alert, NAD, pleasant, comfortable Abd:  Soft, ND, appropriately tender, midline incision c/d/i with >80% granulation tissue and no purulent drainage. Colostomy pouch with liquid stool and gas present, good seal.       Current Facility-Administered Medications (Respiratory):  .  loratadine (CLARITIN) tablet 10 mg .  sodium chloride (OCEAN) 0.65 % nasal spray 1 spray   Current Facility-Administered  Medications (Analgesics):  .   acetaminophen (TYLENOL) solution 650 mg .  fentaNYL (DURAGESIC - dosed mcg/hr) 12.5 mcg .  oxyCODONE (Oxy IR/ROXICODONE) immediate release tablet 5-10 mg   Current Facility-Administered Medications (Hematological):  .  enoxaparin (LOVENOX) injection 40 mg   Current Facility-Administered Medications (Other):  .  cefTRIAXone (ROCEPHIN) 2 g in dextrose 5 % 50 mL IVPB .  dextrose 5 % and 0.9 % NaCl with KCl 20 mEq/L infusion .  escitalopram (LEXAPRO) tablet 20 mg .  feeding supplement (BOOST / RESOURCE BREEZE) liquid 1 Container .  feeding supplement (ENSURE ENLIVE) (ENSURE ENLIVE) liquid 237 mL .  lip balm (CARMEX) ointment .  LORazepam (ATIVAN) tablet 0.5 mg .  MEDLINE mouth rinse .  menthol-cetylpyridinium (CEPACOL) lozenge 3 mg .  metroNIDAZOLE (FLAGYL) 50 mg/ml oral suspension 500 mg .  ondansetron (ZOFRAN) tablet 4 mg **OR** ondansetron (ZOFRAN) injection 4 mg .  pilocarpine (SALAGEN) tablet 5 mg .  polyethylene glycol (MIRALAX / GLYCOLAX) packet 17 g .  sodium chloride flush (NS) 0.9 % injection 10-40 mL .  sodium chloride flush (NS) 0.9 % injection 10-40 mL   Facility-Administered Medications Ordered in Other Encounters (Other):  .  0.9 %  sodium chloride infusion .  0.9 %  sodium chloride infusion .  0.9 %  sodium chloride infusion .  0.9 %  sodium chloride infusion .  0.9 %  sodium chloride infusion No current outpatient prescriptions on file.  Contact information for follow-up providers    Rosario Adie., MD. Schedule an appointment as soon as possible for a visit.   Specialty:  General Surgery Why:  2-3 weeks for post-operative follow-up Contact information: [address] [place] [address] [phone]            Contact information for after-discharge care    [facility] Follow up.   Specialty:  [place] information: [address] [phone]                  Signed: Obie Dredge, [facility] 04/08/2016, 12:19 PM Pager: [phone] Consults: [phone] Mon-Fri 7:00 am-4:30 pm Sat-Sun 7:00 am-11:30 am

## 2016-04-08 NOTE — Discharge Summary (Signed)
Discharge summary - addendum   Continue taking the following medications as below:   Current Meds  Medication Sig  . cetirizine (ZYRTEC) 10 MG tablet Take 10 mg by mouth daily.  Marland Kitchen escitalopram (LEXAPRO) 20 MG tablet Take 1/2 tablet for 1 week then increase to 1 tablet daily.  . fentaNYL (DURAGESIC - DOSED MCG/HR) 12 MCG/HR 1 patch every 3 (three) days.  Marland Kitchen LORazepam (ATIVAN) 0.5 MG tablet Take 1 tablet (0.5 mg total) by mouth every 6 (six) hours as needed for anxiety.  . ondansetron (ZOFRAN ODT) 8 MG disintegrating tablet Take 1 tablet (8 mg total) by mouth every 8 (eight) hours as needed for nausea or vomiting.  . pilocarpine (SALAGEN) 5 MG tablet Take 2 tablets (10 mg total) by mouth 3 (three) times daily. (Patient taking differently: Take 5 mg by mouth 3 (three) times daily. )  . sodium fluoride (FLUORISHIELD) 1.1 % GEL dental gel Instill one drop of gel per tooth space of fluoride tray. Place over teeth for 5 minutes. Remove. Spit out excess. Repeat nightly.  . [DISCONTINUED] oxyCODONE (ROXICODONE) 5 MG/5ML solution Take 5-10 mLs (5-10 mg total) by mouth every 4 (four) hours as needed for severe pain.   Miralax 17 g daily as needed for constipation   Obie Dredge, [facility] Pager: [phone] Consults: [phone] Mon-Fri 7:00 am-4:30 pm Sat-Sun 7:00 am-11:30 am

## 2016-04-08 NOTE — Discharge Instructions (Signed)
[facility]      [facility] °[phone] ° °OPEN ABDOMINAL SURGERY: POST OP INSTRUCTIONS ° °Always review your discharge instruction sheet given to you by the facility where your surgery was performed. ° °IF YOU HAVE DISABILITY OR FAMILY LEAVE FORMS, YOU MUST BRING THEM TO THE OFFICE FOR PROCESSING.  PLEASE DO NOT GIVE THEM TO YOUR DOCTOR. ° °1. A prescription for pain medication may be given to you upon discharge.  Take your pain medication as prescribed, if needed.  If narcotic pain medicine is not needed, then you may take acetaminophen (Tylenol) or ibuprofen (Advil) as needed. °2. Take your usually prescribed medications unless otherwise directed. °3. If you need a refill on your pain medication, please contact your pharmacy. They will contact our office to request authorization.  Prescriptions will not be filled after 5pm or on week-ends. °4. You should follow a light diet the first few days after arrival home, such as soup and crackers, pudding, etc.unless your doctor has advised otherwise. A high-fiber, low fat diet can be resumed as tolerated.   Be sure to include lots of fluids daily. Most patients will experience some swelling and bruising on the chest and neck area.  Ice packs will help.  Swelling and bruising can take several days to resolve °5. Most patients will experience some swelling and bruising in the area of the incision. Ice pack will help. Swelling and bruising can take several days to resolve..  °6. It is common to experience some constipation if taking pain medication after surgery.  Increasing fluid intake and taking a stool softener will usually help or prevent this problem from occurring.  A mild laxative (Milk of Magnesia or Miralax) should be taken according to package directions if there are no bowel movements after 48 hours. °7.  You may have steri-strips (small skin tapes) in place directly over the incision.  These strips should be left on the skin for 7-10 days.  If your  surgeon used skin glue on the incision, you may shower in 24 hours.  The glue will flake off over the next 2-3 weeks.  Any sutures or staples will be removed at the office during your follow-up visit. You may find that a light gauze bandage over your incision may keep your staples from being rubbed or pulled. You may shower and replace the bandage daily. °8. ACTIVITIES:  You may resume regular (light) daily activities beginning the next day--such as daily self-care, walking, climbing stairs--gradually increasing activities as tolerated.  You may have sexual intercourse when it is comfortable.  Refrain from any heavy lifting or straining until approved by your doctor. °a. You may drive when you no longer are taking prescription pain medication, you can comfortably wear a seatbelt, and you can safely maneuver your car and apply brakes °b. Return to Work: ___________________________________ °9. You should see your doctor in the office for a follow-up appointment approximately two weeks after your surgery.  Make sure that you call for this appointment within a day or two after you arrive home to insure a convenient appointment time. °OTHER INSTRUCTIONS:  °_____________________________________________________________ °_____________________________________________________________ ° °WHEN TO CALL YOUR DOCTOR: °1. Fever over 101.0 °2. Inability to urinate °3. Nausea and/or vomiting °4. Extreme swelling or bruising °5. Continued bleeding from incision. °6. Increased pain, redness, or drainage from the incision. °7. Difficulty swallowing or breathing °8. Muscle cramping or spasms. °9. Numbness or tingling in hands or feet or around lips. ° °The clinic staff is available to   answer your questions during regular business hours.  Please don’t hesitate to call and ask to speak to one of the nurses if you have concerns. ° °For further questions, please visit [email or web address] ° °Low-Fiber Diet °Fiber is found in  fruits, vegetables, and whole grains. A low-fiber diet restricts fibrous foods that are not digested in the small intestine. A diet containing about 10-15 grams of fiber per day is considered low fiber. Low-fiber diets may be used to: °· Promote healing and rest the bowel during intestinal flare-ups. °· Prevent blockage of a partially obstructed or narrowed gastrointestinal tract. °· Reduce fecal weight and volume. °· Slow the movement of feces. °You may be on a low-fiber diet as a transitional diet following surgery, after an injury (trauma), or because of a short (acute) or lifelong (chronic) illness. Your health care provider will determine the length of time you need to stay on this diet. °What do I need to know about a low-fiber diet? °Always check the fiber content on the packaging's Nutrition Facts label, especially on foods from the grains list. Ask your dietitian if you have questions about specific foods that are related to your condition, especially if the food is not listed below. In general, a low-fiber food will have less than 2 g of fiber. °What foods can I eat? °Grains  °All breads and crackers made with white flour. Sweet rolls, doughnuts, waffles, pancakes, French toast, bagels. Pretzels, Melba toast, zwieback. Well-cooked cereals, such as cornmeal, farina, or cream cereals. Dry cereals that do not contain whole grains, fruit, or nuts, such as refined corn, wheat, rice, and oat cereals. Potatoes prepared any way without skins, plain pastas and noodles, refined white rice. Use white flour for baking and making sauces. Use allowed list of grains for casseroles, dumplings, and puddings. °Vegetables  °Strained tomato and vegetable juices. Fresh lettuce, cucumber, spinach. Well-cooked (no skin or pulp) or canned vegetables, such as asparagus, bean sprouts, beets, carrots, green beans, mushrooms, potatoes, pumpkin, spinach, yellow squash, tomato sauce/puree, turnips, yams, and zucchini. Keep servings  limited to ½ cup. °Fruits  °All fruit juices except prune juice. Cooked or canned fruits without skin and seeds, such as applesauce, apricots, cherries, fruit cocktail, grapefruit, grapes, mandarin oranges, melons, peaches, pears, pineapple, and plums. Fresh fruits without skin, such as apricots, avocados, bananas, melons, pineapple, nectarines, and peaches. Keep servings limited to ½ cup or 1 piece. °Meat and Other Protein Sources  °Ground or well-cooked tender beef, ham, veal, lamb, pork, or poultry. Eggs, plain cheese. Fish, oysters, shrimp, lobster, and other seafood. Liver, organ meats. Smooth nut butters. °Dairy  °All milk products and alternative dairy substitutes, such as soy, rice, almond, and coconut, not containing added whole nuts, seeds, or added fruit. °Beverages  °Decaf coffee, fruit, and vegetable juices or smoothies (small amounts, with no pulp or skins, and with fruits from allowed list), sports drinks, herbal tea. °Condiments  °Ketchup, mustard, vinegar, cream sauce, cheese sauce, cocoa powder. Spices in moderation, such as allspice, basil, bay leaves, celery powder or leaves, cinnamon, cumin powder, curry powder, ginger, mace, marjoram, onion or garlic powder, oregano, paprika, parsley flakes, ground pepper, rosemary, sage, savory, tarragon, thyme, and turmeric. °Sweets and Desserts  °Plain cakes and cookies, pie made with allowed fruit, pudding, custard, cream pie. Gelatin, fruit, ice, sherbet, frozen ice pops. Ice cream, ice milk without nuts. Plain hard candy, honey, jelly, molasses, syrup, sugar, chocolate syrup, gumdrops, marshmallows. Limit overall sugar intake. °Fats and Oil  °Margarine, butter, cream, mayonnaise,   salad oils, plain salad dressings made from allowed foods. Choose healthy fats such as olive oil, canola oil, and omega-3 fatty acids (such as found in salmon or tuna) when possible. °Other  °Bouillon, broth, or cream soups made from allowed foods. Any strained soup. Casseroles  or mixed dishes made with allowed foods. °The items listed above may not be a complete list of recommended foods or beverages. Contact your dietitian for more options.  °What foods are not recommended? °Grains  °All whole wheat and whole grain breads and crackers. Multigrains, rye, bran seeds, nuts, or coconut. Cereals containing whole grains, multigrains, bran, coconut, nuts, raisins. Cooked or dry oatmeal, steel-cut oats. Coarse wheat cereals, granola. Cereals advertised as high fiber. Potato skins. Whole grain pasta, wild or brown rice. Popcorn. Coconut flour. Bran, buckwheat, corn bread, multigrains, rye, wheat germ. °Vegetables  °Fresh, cooked or canned vegetables, such as artichokes, asparagus, beet greens, broccoli, Brussels sprouts, cabbage, celery, cauliflower, corn, eggplant, kale, legumes or beans, okra, peas, and tomatoes. Avoid large servings of any vegetables, especially raw vegetables. °Fruits  °Fresh fruits, such as apples with or without skin, berries, cherries, figs, grapes, grapefruit, guavas, kiwis, mangoes, oranges, papayas, pears, persimmons, pineapple, and pomegranate. Prune juice and juices with pulp, stewed or dried prunes. Dried fruits, dates, raisins. Fruit seeds or skins. Avoid large servings of all fresh fruits. °Meats and Other Protein Sources  °Tough, fibrous meats with gristle. Chunky nut butter. Cheese made with seeds, nuts, or other foods not recommended. Nuts, seeds, legumes (beans, including baked beans), dried peas, beans, lentils. °Dairy  °Yogurt or cheese that contains nuts, seeds, or added fruit. °Beverages  °Fruit juices with high pulp, prune juice. Caffeinated coffee and teas. °Condiments  °Coconut, maple syrup, pickles, olives. °Sweets and Desserts  °Desserts, cookies, or candies that contain nuts or coconut, chunky peanut butter, dried fruits. Jams, preserves with seeds, marmalade. Large amounts of sugar and sweets. Any other dessert made with fruits from the not  recommended list. °Other  °Soups made from vegetables that are not recommended or that contain other foods not recommended. °The items listed above may not be a complete list of foods and beverages to avoid. Contact your dietitian for more information.  °This information is not intended to replace advice given to you by your health care provider. Make sure you discuss any questions you have with your health care provider. °Document Released: 10/31/2001 Document Revised: 10/17/2015 Document Reviewed: 04/03/2013 °Elsevier Interactive Patient Education © 2017 Elsevier Inc. ° ° °

## 2016-04-08 NOTE — Consult Note (Signed)
[facility] Nurse ostomy follow up Stoma type/location: new pouching system is still intact with no indication of leak.  Will not change today.  Stomal assessment/size: 1 and 3/4 inches round, moist Peristomal assessment: Not seen today Treatment options for stomal/peristomal skin: skin barrier ring Output Brown liquid effluent Ostomy pouching: 1pc flat with skin barrier ring Education provided: Patient taught that next pouch is precut for use.  IT  Enrolled patient in [place] Start Discharge program: Yes Call placed today to request 1-piece CeraPlus pouches with integrated gas filter to be sent to home. Patient is discharging today to [facility] in [place].  They have my contact info if needed. Thanks, Maudie Flakes, MSN, RN, [place], Arther Abbott  Pager# [phone]

## 2016-04-08 NOTE — Progress Notes (Signed)
Pt was given discharged instructions, questions were answered. Report was called to Arlis Porta at the  [facility] center in [place]. [facility] picked up the patient and transported to facility. [place]

## 2016-04-09 LAB — CULTURE, BLOOD (ROUTINE X 2)
CULTURE: NO GROWTH
CULTURE: NO GROWTH

## 2016-04-10 ENCOUNTER — Other Ambulatory Visit (HOSPITAL_COMMUNITY): Payer: Self-pay | Admitting: Emergency Medicine

## 2016-04-10 ENCOUNTER — Encounter (HOSPITAL_COMMUNITY): Payer: Self-pay

## 2016-04-10 MED ORDER — OXYCODONE HCL 5 MG/5ML PO SOLN: 5.0000 mg | ORAL | 0 refills | Status: DC | PRN

## 2016-04-10 NOTE — Progress Notes (Signed)
Call placed to sister regarding message she left for a nurse to call.  She just wanted to inform us that her brother had been hospitalized and was currently in a SNF.  MD is aware and records are in EPIC.

## 2016-04-10 NOTE — Progress Notes (Signed)
Pt called and stated that he was only getting tylenol as break through pain medication and wanted to know if we could get him oxycodone.   We refilled oxycodone for him. I spoke with the nurse at [facility] in [place]. Gave her a verbal order for Oxycodone 5mg /40ml every 4 hours as needed for pain. Also faxed at 1:19 pm the prescription to [facility] in [place] at [phone]. Called [facility] at 1:47 pm and verified that they received the fax and that it will be delivered to the [facility] at [phone].

## 2016-04-14 ENCOUNTER — Encounter: Payer: Self-pay | Admitting: *Deleted

## 2016-04-14 ENCOUNTER — Encounter (HOSPITAL_BASED_OUTPATIENT_CLINIC_OR_DEPARTMENT_OTHER): Payer: BLUE CROSS/BLUE SHIELD | Admitting: Adult Health

## 2016-04-14 ENCOUNTER — Encounter: Payer: Self-pay | Admitting: Dietician

## 2016-04-14 ENCOUNTER — Encounter (HOSPITAL_COMMUNITY): Payer: BLUE CROSS/BLUE SHIELD | Attending: Hematology & Oncology | Admitting: Hematology & Oncology

## 2016-04-14 ENCOUNTER — Encounter (HOSPITAL_COMMUNITY): Payer: Self-pay | Admitting: Hematology & Oncology

## 2016-04-14 VITALS — BP 112/69 | HR 108 | Temp 98.9°F | Resp 16 | Ht 70.0 in | Wt 137.0 lb

## 2016-04-14 DIAGNOSIS — Z95828 Presence of other vascular implants and grafts: Secondary | ICD-10-CM | POA: Insufficient documentation

## 2016-04-14 DIAGNOSIS — F329 Major depressive disorder, single episode, unspecified: Secondary | ICD-10-CM | POA: Diagnosis not present

## 2016-04-14 DIAGNOSIS — C090 Malignant neoplasm of tonsillar fossa: Secondary | ICD-10-CM | POA: Insufficient documentation

## 2016-04-14 DIAGNOSIS — Z939 Artificial opening status, unspecified: Secondary | ICD-10-CM

## 2016-04-14 DIAGNOSIS — Z933 Colostomy status: Secondary | ICD-10-CM

## 2016-04-14 DIAGNOSIS — D649 Anemia, unspecified: Secondary | ICD-10-CM

## 2016-04-14 DIAGNOSIS — E46 Unspecified protein-calorie malnutrition: Secondary | ICD-10-CM | POA: Diagnosis not present

## 2016-04-14 DIAGNOSIS — R05 Cough: Secondary | ICD-10-CM | POA: Insufficient documentation

## 2016-04-14 DIAGNOSIS — K631 Perforation of intestine (nontraumatic): Secondary | ICD-10-CM

## 2016-04-14 DIAGNOSIS — L739 Follicular disorder, unspecified: Secondary | ICD-10-CM | POA: Insufficient documentation

## 2016-04-14 DIAGNOSIS — R634 Abnormal weight loss: Secondary | ICD-10-CM

## 2016-04-14 DIAGNOSIS — F17200 Nicotine dependence, unspecified, uncomplicated: Secondary | ICD-10-CM

## 2016-04-14 DIAGNOSIS — B370 Candidal stomatitis: Secondary | ICD-10-CM | POA: Insufficient documentation

## 2016-04-14 DIAGNOSIS — R112 Nausea with vomiting, unspecified: Secondary | ICD-10-CM | POA: Insufficient documentation

## 2016-04-14 DIAGNOSIS — C099 Malignant neoplasm of tonsil, unspecified: Secondary | ICD-10-CM | POA: Insufficient documentation

## 2016-04-14 NOTE — Progress Notes (Signed)
Frank Castillo, [place] [id number]   CLINIC:  Survivorship, Medical Oncology  REASON FOR VISIT:  Post-hospitalization follow-up; history of tonsil cancer.  BRIEF ONCOLOGY HISTORY:    Cancer of tonsillar fossa ([place])   08/02/2015 Initial Diagnosis    Cancer of tonsillar fossa ([place])      08/20/2015 Procedure    Port-a-cath placed by IR      08/20/2015 Procedure    G-tube placed by IR      08/21/2015 - 10/09/2015 Radiation Therapy    70 Gy, Dr. Isidore Moos.      08/21/2015 - 09/10/2015 Chemotherapy    Cisplatin every 21 days with XRT.      08/28/2015 Adverse Reaction    Tinnitis, Cisplatin-induced.      09/11/2015 - 09/30/2015 Chemotherapy    Carboplatin/5FU      09/20/2015 - 09/21/2015 Hospital Admission    Intractable nausea/vomiting. Severe Mucositis      10/17/2015 - 10/20/2015 Hospital Admission    Esophageal/oral candidasis. Shingles. Fever. Chemotherapy induced pancytopenia      01/24/2016 PET scan    Resolution of the left tonsillar mass and left neck adenopathy. No findings for residual or locally recurrent tumor, adenopathy or distant metastatic disease.      03/28/2016 - 04/08/2016 Hospital Admission    Admitted with severe abdominal pain. Found to diffuse peritonitis from perforated sigmoid colon requiring colostomy. Discharged to [facility] in [place].             INTERVAL HISTORY:  Mr. Bredeson presents to clinic today for follow-up since his recent hospitalization at [facility] from 03/28/16-04/08/16 for severe abdominal pain. He was found to have diffuse peritonitis due to perforated sigmoid colon.  He underwent exploratory laparotomy, followed by placement of colostomy. He was discharged to SNF at [facility] in [place] where he continues to be at this time.   He is adjusting to his colostomy. He is able to care for it independently. He is still learning what the colostomy output should look like. He empties his pouch  once-twice per day.   He tells me he has been slowly improving physically since his hospitalization.  He feels like he is getting a little bit stronger with PT at the [facility], but it has been slow progress for him.    His pain is well-managed with 12 mcg Fentanyl patch and Oxycodone for breakthrough pain about 4 times per day.  He endorses more abdominal pain than throat pain at this time.    He thinks he has lost over 13 pounds since he was hospitalized. He tells me that his weight was stable prior to this recent illness and he was even considering having his feeding tube removed.  Now, he wants to know if he should restart his tube feedings; he was previously on Jevity. He is not able to eat any of the foods they have for him there.  He tells me he was given dry toast and Cheerios for his breakfast today, and it is difficult for him to eat these foods given his xerostomia and associated dysphagia as a result of dry mouth.  He manages the dry mouth by drinking water, using Biotene products, and XyliMelts. He also has a prescription for Salogen.  He has not been able to get much Ensure or Boost at the [facility] to help supplement his diet.    Our recorded weights:  -02/27/16-152 lbs 2 oz -04/14/16-137 lbs  Emotionally, he has been receiving support from his "second family" through his Alcoholics Anonymous group.  His traditional family has been very supportive, but he is trying to rely less on them since he was discharged to the hospital to SNF.  He feels that overall his spirits and mood have been good; he feels like he is coping well and "doing the best I can do right now."  He denies drinking any alcohol at this time.    He continues to smoke cigarettes; he smokes about 01/2 ppd since his recent hospitalization. He previously smoked 1-1.5 ppd.  This has been a very stressful time for him and he is not ready to quit smoking at this time.  He is open to the possibility in the future, but not  today.    ADDITIONAL REVIEW OF SYSTEMS:  ROS, per HPI.    PAST MEDICAL & SURGICAL HISTORY:  Past Medical History:  Diagnosis Date  . Cirrhosis ([place])   . Hypertension   . Portal hypertension ([place])   . Tonsillar mass    left   Past Surgical History:  Procedure Laterality Date  . HERNIA REPAIR Right   . LAPAROTOMY N/A 03/28/2016   Procedure: EXPLORATORY LAPAROTOMY, SIGMOID COLECTOMY, COLOSTOMY;  Surgeon: Leighton Ruff, MD;  Location: [facility];  Service: General;  Laterality: N/A;  . MOUTH SURGERY    . TONSILLECTOMY Left 07/09/2015   Procedure: LEFT TONSILLECTOMY;  Surgeon: Leta Baptist, MD;  Location: [place];  Service: ENT;  Laterality: Left;      CURRENT MEDICATIONS:  Current Outpatient Prescriptions on File Prior to Visit  Medication Sig Dispense Refill  . cetirizine (ZYRTEC) 10 MG tablet Take 10 mg by mouth daily.    Marland Kitchen escitalopram (LEXAPRO) 20 MG tablet Take 1/2 tablet for 1 week then increase to 1 tablet daily. 30 tablet 3  . fentaNYL (DURAGESIC - DOSED MCG/HR) 12 MCG/HR 1 patch every 3 (three) days.  0  . LORazepam (ATIVAN) 0.5 MG tablet Take 1 tablet (0.5 mg total) by mouth every 6 (six) hours as needed for anxiety. 45 tablet 0  . ondansetron (ZOFRAN ODT) 8 MG disintegrating tablet Take 1 tablet (8 mg total) by mouth every 8 (eight) hours as needed for nausea or vomiting. 30 tablet 2  . oxyCODONE (ROXICODONE) 5 MG/5ML solution Take 5 mLs (5 mg total) by mouth every 4 (four) hours as needed for severe pain. 250 mL 0  . pilocarpine (SALAGEN) 5 MG tablet Take 2 tablets (10 mg total) by mouth 3 (three) times daily. (Patient taking differently: Take 5 mg by mouth 3 (three) times daily. ) 180 tablet 3  . sodium fluoride (FLUORISHIELD) 1.1 % GEL dental gel Instill one drop of gel per tooth space of fluoride tray. Place over teeth for 5 minutes. Remove. Spit out excess. Repeat nightly. 125 mL 2   Current Facility-Administered Medications on File Prior to Visit    Medication Dose Route Frequency Provider Last Rate Last Dose  . 0.9 %  sodium chloride infusion   Intravenous Continuous Manon Hilding Kefalas, PA-C      . 0.9 %  sodium chloride infusion   Intravenous Continuous Manon Hilding Kefalas, PA-C      . 0.9 %  sodium chloride infusion   Intravenous Continuous Shannon K Penland, MD      . 0.9 %  sodium chloride infusion   Intravenous Continuous Kelby Fam Penland, MD      . 0.9 %  sodium chloride infusion  Intravenous Continuous Patrici Ranks, MD        ALLERGIES: Allergies  Allergen Reactions  . Bee Venom Swelling  . Other     Opthalmic mycin irritates eyes.     PHYSICAL EXAM:   Vitals:    General: Thin male in no acute distress.  Unaccompanied today.    HEENT: Head is normocephalic.  Pupils equal and reactive to light. Conjunctivae clear without exudate.  Sclerae anicteric. Oral mucosa is pink and dry. Oropharynx is mildly erythematous.   Lymph: No cervical, supraclavicular, or infraclavicular lymphadenopathy noted on palpation.  Neck: No palpable masses. Scant anterior neck lymphedema.   Cardiovascular: Mild tachycardia; regular rhythm.  Respiratory: Clear to auscultation bilaterally. Chest expansion symmetric without accessory muscle use. Breathing non-labored.    GU: Deferred.   GI: Soft, non-tender abdomen. Normoactive bowel sounds. G-tube in place; dressing clean/dry/intact.  Mid-line incision covered with dressing; mild serous drainage noted to dressing.  Left colostomy intact; stoma pink. Output non-bloody.  Neuro: No focal deficits. Steady gait.   Psych: Normal mood and affect for situation. Appropriately tearful at times.  Extremities: No edema. Skin: Warm and dry.   LABORATORY DATA: None for this visit.   DIAGNOSTIC IMAGING:  Most recent: CT abd/pelvis-03/28/16      ASSESSMENT & PLAN:  Mr. Fouche is a pleasant 65 y.o. 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  He completed treatment on 09/30/15.  PET scan on 01/24/16 should resolution of left tonsillar mass and left neck adenopathy.  Patient presents to survivorship clinic today for long-term survivorship visit and routine cancer surveillance.    1. Stage IVA left tonsil cancer: Clinically, he is without evidence of disease recurrence based on physical exam/diagnostic imaging. He will return to [facility] for continued follow-up/surveillance visit with Dr. Whitney Muse in 04/2016.    2. Protein-calorie malnutrition:  [name], RD to see patient today. He recommends that patient go back on tube feedings. He also recommends patient resume Ensure/Boost supplementation, as tolerated.  These orders were taken care of by [facility].  I encouraged patient to continue to consume oral intake, as tolerated, in addition to his tube feedings as we want to reduce his risk of dysphagia by maintaining adequate swallowing exercises/strength.    3. Bowel regimen: Mr. Domangue remains on opiates for pain control and currently does not have a prescribed bowel regimen at the [facility].  I provided a paper prescription for Miralax 17 gm po/VT daily. He can titrate to using this medication every day or every other day if his ostomy output drastically increases (which I do not think will be the case for him).   4. Adjustment disorder with depressed mood: Mr. Ambrosius was seen today in conjunction with our oncology social worker, Loren Racer, LCSW.  He has quite a few stressors as it relates to his health at present time.  This is very frustrating for him. He remains at the [facility] and is receiving physical therapy/rehab. He appears to have adequate support from his family and AA family.  He maintains  contact with his AA sponsor and denies any relapse with drinking alcohol.  He appears to be coping well, despite this recent illness and hospitalization.  He remains hopeful for the future.  He  was provided support today through active listening, validation of his concerns, and expressive supportive counseling.   5. Tobacco use disorder:  He was able to quit completely for about 2 weeks, but the stress of this hospitalization has caused him to relapse with smoking.  He has used meditation and setting boundaries on where he allows himself to smoke as helpful strategies to stop smoking.  He is not ready to set a quit date.  I commended his efforts to cut down on his tobacco use and he understand the importance of smoking cessation as it relates to risk reduction for cancer, healing, and overall health. We can certainly revisit more aggressive smoking cessation efforts in the future. Greater than 10 minutes spent in tobacco cessation counseling.     *This patient was also seen and examined by Dr. Ancil Linsey today; this is a shared visit.    Dispo:  -Return to [facility] to see Dr. Whitney Muse in 04/2016.   A total of 35 minutes was spent in face-to-face care of this patient, with greater than 50% of that time spent in counseling and care coordination.    Mike Craze, NP Survivorship Program [place] [phone]    This was a shared visit with Mike Craze NP I agree with her HPI, PE, and assessment and plan. My findings are below:  HPI: Patient returns today for ongoing follow-up and recovery of his head an neck cancer. Unfortunately recently complicated by perforated sigmoid colon, peritonitis. He now has a temporary ostomy. Realistically he is doing well. He has struggled through his healing of his head and neck cancer. He notes today that he is "doing ok." He does continue to smoke. This is not a good time for him to quit. He is leaning on friends  from [place]. He is not drinking ETOH. He has lost weight. Pain is controlled.  14 point review of systems was performed and is negative except as detailed under history of present illness and above Meds, social history, family history, pmedhx, psurghx reviewed and as detailed above.  Physical Exam  Constitutional: He is oriented to person, place, and time and well-developed, well-nourished, and in no distress.  HENT:  Head: Normocephalic and atraumatic.  Nose: Nose normal.  Mouth/Throat: Oropharynx is clear and moist. No oropharyngeal exudate.  Eyes: Conjunctivae and EOM are normal. Pupils are equal, round, and reactive to light. Right eye exhibits no discharge. Left eye exhibits no discharge. No scleral icterus.  Neck: Normal range of motion. Neck supple. No tracheal deviation present. No thyromegaly present.  Radiation changes but minimal. Mild edema  Cardiovascular: Normal rate, regular rhythm and normal heart sounds.  Exam reveals no gallop and no friction rub.   No murmur heard. Pulmonary/Chest: Effort normal and breath sounds normal. He has no wheezes. He has no rales.  Abdominal: Soft. Bowel sounds are normal. He exhibits no distension and no mass. There is no tenderness. There is no rebound and no guarding.  Ostomy site C/D/I with normal appearing stool  Musculoskeletal: Normal range of motion. He exhibits no edema.  Lymphadenopathy:    He has no cervical adenopathy.  Neurological: He is alert and oriented to person, place, and time. He has normal reflexes. No cranial nerve deficit. Gait normal. Coordination normal.  Skin: Skin is warm and dry. No rash noted.  Psychiatric: Mood, memory, affect and judgment normal.  Nursing note and vitals reviewed.  ASSESSMENT & PLAN:  Invasive squamous cell carcinoma of the Left  tonsil, moderately differentiated, p16 positive Cirrhosis, portal hypertension. He is seen at [facility], Hepatitis C negative CT neck with contrast 06/25/2015 at [place] level 2 malignant LAD, largest node is 3.5 cm long axis. R worse than L carotid atherosclerosis and stenosis. CT chest with no acute or significant findings within the chest [facility] stay secondary to intractable n/v and mucositis Oropharyngeal candidiasis/esophageal candidiasis Odynophagia Fever Shingles Chemotherapy induced pancytopenia Perforated sigmoid colon/exlap/temporary colostomy  Overall doing fairly well. Has been a difficult year for him, but certainly trying to stay positive. Pain is controlled. Continue current pain regimen. We have discussed smoking cessation in the past but will delay this for now.  He met with Ovid Curd today and will resume TF, he will also continue to work on increasing oral intake. No medication changes today. Will follow-up with me in 2 to 3 weeks.  Met with social work today as well. Molli Hazard, MD

## 2016-04-14 NOTE — Progress Notes (Signed)
[place] CLINICAL SOCIAL WORK PSYCHOSOCIAL ASSESSMENT   Date:  04/14/16   First Name: Frank Cass.:       Last Name: Frank Castillo MRN:  [id number]    Primary Cancer Type: Cancer of tonsillar fossa ([place])   Staging form: Pharynx - Oropharynx, AJCC 7th Edition   - Clinical: Stage IVA (T2, N2b, M0) - Signed by Eppie Gibson, MD on 08/02/2015        Marital Status: Single  Practical Problems: yes, diet related    Insurance: bcbs   Family Problems:  None Identified Concerns caring for family needs: None Identified    Emotional Problems: yes  Concerns of Adjustment to Diagnosis/Treatment: yes   Current Symptoms of Anxiety: No symptoms identified   Current Symptoms of Depression: Pt tearful, but denies depression.   Safety/Risk Concerns: None identified   Mental Status Exam Orientation: person, place, and time Affect: appropriate Thought: normal      Spiritual/Religious: None identified  Living Situation: Currently, at [facility] for Rehab after lengthy hospitalization after emergency surgery.  Functional Status: Assistance Required                                                                                            Strengths and Barriers To Treatment:   Patient Coping Strengths:   Supportive Relationships, Family, Friends, [place], [employer], Spirituality, Hopefulness, Conservator, museum/gallery and Able to Communicate Effectively                                                                                                  Identified Problems/Needs and Barriers to Care:  Illness-related problems and Adjustment to Illness    Counseling and Social Work Interventions and Recommendations:  Data processing manager, Pension scheme manager, Advocacy/Education and Referral to [employer]   Impressions/Plan: This was a joint visit with NP, Mike Craze and LCSW. Pt staying at SNF for skilled PT/OT after extensive  hospital stay and surgery. He is unsure how long he will stay, but is aware and agrees to SNF for extensive rehab. Pt reports he has had increased weight loss as the food has been difficult to swallow at SNF and he has been working hard with PT. Referral made to dietician to address these concerns. Pt shared the emotional challenges due to emergency surgery after attempting to recover from his cancer. Supportive listening provided. Pt has strong support system and has developed strong coping skills from his years in substance abuse recovery. He currently has been receiving strong supports from his [place] community. He feels through these supports and coping techniques he is able to manage his current challenges. Pt appears to be moving forward and denies other concerns. He met with dietician today  for further assistance. Pt aware to reach out as other needs arise.   Loren Racer, [place] T[employer]   [phone]

## 2016-04-14 NOTE — Progress Notes (Signed)
Follow up with H&N patient who is s/p chemoradiation and under going rehabilitation   He is being seen after 11 day hospital admission for bowel perforation. He is s/p partial colectomy with colostomy. Post OP course was complicated by ileus and resulting nausea/vomiting. He is currently at a SNF undergoing short term Rehab.  Contacted Pt by visiting at office visit    Wt Readings from Last 10 Encounters:  04/14/16 137 lb (62.1 kg)  03/28/16 150 lb (68 kg)  02/27/16 152 lb 1.6 oz (69 kg)  02/03/16 153 lb 1.6 oz (69.4 kg)  01/15/16 150 lb 12.8 oz (68.4 kg)  12/27/15 153 lb 11.2 oz (69.7 kg)  12/17/15 155 lb 6.4 oz (70.5 kg)  11/28/15 154 lb 12.8 oz (70.2 kg)  11/05/15 162 lb 3.2 oz (73.6 kg)  10/22/15 161 lb 6.4 oz (73.2 kg)   Unsurprisingly, he has lost a large amount of wt, 13 lbs in the past three weeks. He is noticeably smaller/thinner today.   Patient reports oral intake as poor/fair and is currently suffering from symptoms including dry mouth and reduced appetite.   Pt reports that the SNF has been providing him with foods, such as dry toast, that are difficult for him to eat. He says he is "maybe eating a quarter" of his meals. His family has been providing him with Ensure/Boost from outside. He does not know if the facility provides these.   His mouth dryness continues to be a large obstacle to improving his PO intake. He says he is suffering side effects from his pilocarpine. He had been using a biotene rinse, but says he no longer is doing much of what he did at home since he is now living at [facility].   He says he was discharged from the hospital on a clear liquid diet. He was not told he needed to follow any type of GI soft diet.    Given the severity of his weight loss, Discussed restarting his tube feeds. He has at a facility where he will not receive optimal foods and also has a poor appetite. He was agreeable to this. He says he still has 3 cases of Jevity 1.2 at home though  he recently has started being billed for this which he had not been. He says the [facility] stated it needs further documentation. RD reached out to [facility] liaison to determine exactly what is needed by his insurance company.   Pt had been using 2 cans of Jevity 1.2 per day prior to 10/5 when he started to try to maintain weight with oral intake. We discussed restarting TF with 2-4 cans/day. Agreed upon administering 1 can in the morning and 1 can at night. He will then infuse one additional can for each meal that he poorly eats. He is urged to continue to drink the supplements. RD will contact facility to see if these are available on site.    He has not had any problems with his colostomy. He empties it ~2x a day. The output is still watery. He says he has a charcoal filtered ostomy bag.   RD will continue to monitor.   [name] RD, LDN, [facility] Pager: [phone] 04/14/2016 11:05 AM

## 2016-04-14 NOTE — Progress Notes (Signed)
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 He is seen at [facility], Hepatitis C negative CT neck with contrast 06/25/2015 at [place] level 2 malignant LAD, largest node is 3.5 cm long axis. R worse than L carotid atherosclerosis and stenosis. CT chest with no acute or significant findings within the chest    Cancer of tonsillar fossa ([place])   08/02/2015 Initial Diagnosis    Cancer of tonsillar fossa ([place])      08/20/2015 Procedure    Port-a-cath placed by IR      08/20/2015 Procedure    G-tube placed by IR      08/21/2015 - 10/09/2015 Radiation Therapy    70 Gy, Dr. Isidore Moos.      08/21/2015 - 09/10/2015 Chemotherapy    Cisplatin every 21 days with XRT.      08/28/2015 Adverse Reaction    Tinnitis, Cisplatin-induced.      09/11/2015 - 09/30/2015 Chemotherapy    Carboplatin/5FU      09/20/2015 - 09/21/2015 Hospital Admission    Intractable nausea/vomiting. Severe Mucositis      10/17/2015 - 10/20/2015 Hospital Admission    Esophageal/oral candidasis. Shingles. Fever. Chemotherapy induced pancytopenia      01/24/2016 PET scan    Resolution of the left tonsillar mass and left neck adenopathy. No findings for residual or locally recurrent tumor, adenopathy or distant metastatic disease.       HISTORY OF PRESENTING ILLNESS:  Frank Castillo 65 y.o. male is here for further follow-up of Stage IVA T2N2bM0 squamous cell Ca of the L tonsil. He has completed therapy. Is making a recovery but has struggled with smoking cessation, depression, weight maintenance.   Mr. Fintel is unaccompanied. Since our last  visit, he had an exploratory laparotomy, sigmoid colectomy, and colostomy placement on [id number] with Dr. Leighton Ruff. He is recovering well. He is currently staying at the [facility].  Our office received a call from one of his sisters stating the patient had begun drinking again. When I asked the patient, he denied this.  He has been relying more on his "secondary family" referring to [place] members, instead of his family lately.  While speaking with Mike Craze, NP the patient did not feel he was in a good place to begin smoking cessation.   He is working with the [facility] and [place] to increase his food intake. He has lost about 13 lbs since the beginning of this month.  He has been progressing with physical therapy at the [facility]. He is not sure how much longer he will be at the [facility].   States he is coping with his colostomy.  Mood is ok, he is staying positive. Denies severe throat pain, no choking.    MEDICAL HISTORY:  Past Medical History:  Diagnosis Date  . Cirrhosis ([place])   . Hypertension   . Portal hypertension ([place])   . Tonsillar mass    left    SURGICAL HISTORY: Past Surgical History:  Procedure Laterality Date  . HERNIA REPAIR Right   . LAPAROTOMY N/A 03/28/2016   Procedure: EXPLORATORY LAPAROTOMY, SIGMOID COLECTOMY, COLOSTOMY;  Surgeon: Leighton Ruff, MD;  Location: [facility];  Service: General;  Laterality: N/A;  . MOUTH SURGERY    . TONSILLECTOMY Left 07/09/2015   Procedure: LEFT TONSILLECTOMY;  Surgeon: Leta Baptist, MD;  Location: [facility];  Service: ENT;  Laterality: Left;    SOCIAL HISTORY: Social History   Social History  . Marital status: Single    Spouse name: N/A  . Number of children: 0  . Years of education: N/A   Occupational History  . Not on file.   Social History Main Topics  . Smoking status: Current Every Day Smoker    Packs/day: 0.50    Types: Cigarettes  . Smokeless tobacco: Never Used    . Alcohol use No     Comment: former abuse- quit 09/2011  . Drug use: No  . Sexual activity: Not on file   Other Topics Concern  . Not on file   Social History Narrative   Divorced.   No children.   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   Currently smoking approximately one pack per day.   Has been smoking off/on since age 50.   Divorced No children. Just step-children. 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 Previously worked in [employer]. Originally from [place], [place]. Current smoker at 1 ppd. Began smoking at 50-17 yo, regularly smoking at 38-21 yo. During his "cocaine years" he smoked about 2 ppd. ETOH, recovering alcoholic. 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 He enjoys reading. 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  FAMILY HISTORY: Family History  Problem Relation Age of Onset  . Macular degeneration Mother   . Alcoholism Father    indicated that his mother is alive. He indicated that his father is deceased.   Mother still living at 69 years-old, with macular degeneration. She has no other health issues. Father deceased. Parents divorced at 59 years-old, so he is not sure what he died from. Father was an alcoholic. 4 sisters. 1 sister died of suicide. 1 sister was adopted. 1 Sister lives in [place]. 1 Sister lives in [place].  ALLERGIES:  is allergic to bee venom and other.  MEDICATIONS:  Current Outpatient Prescriptions  Medication Sig Dispense Refill  . cetirizine (ZYRTEC) 10 MG tablet Take 10 mg by mouth daily.    Marland Kitchen escitalopram (LEXAPRO) 20 MG tablet Take 1/2 tablet for 1 week then increase to 1 tablet daily. 30 tablet 3  . fentaNYL (DURAGESIC - DOSED MCG/HR) 12 MCG/HR 1 patch every 3 (three) days.  0  . LORazepam (ATIVAN) 0.5 MG tablet Take 1 tablet (0.5 mg total) by mouth every 6 (six) hours as needed for anxiety. 45 tablet 0  . ondansetron (ZOFRAN ODT) 8 MG disintegrating tablet Take 1 tablet (8 mg total) by mouth every 8 (eight)  hours as needed for nausea or vomiting. 30 tablet 2  . oxyCODONE (ROXICODONE) 5 MG/5ML solution Take 5 mLs (5 mg total) by mouth every 4 (four) hours as needed for severe pain. 250 mL 0  . pilocarpine (SALAGEN) 5 MG tablet Take 2 tablets (10 mg total) by mouth 3 (three) times daily. (Patient taking differently: Take 5 mg by mouth 3 (three) times daily. ) 180 tablet 3  . sodium fluoride (FLUORISHIELD) 1.1 % GEL dental gel Instill one drop of gel per tooth space of fluoride tray. Place over teeth for 5 minutes. Remove. Spit out excess. Repeat nightly. 125 mL 2   No current facility-administered medications for this visit.    Facility-Administered Medications Ordered in Other Visits  Medication Dose Route Frequency Provider Last Rate Last Dose  . 0.9 %  sodium chloride infusion   Intravenous Continuous Manon Hilding Kefalas, PA-C      . 0.9 %  sodium chloride infusion   Intravenous Continuous Manon Hilding Kefalas, PA-C      . 0.9 %  sodium chloride infusion   Intravenous Continuous Kyshon Tolliver K Sissy Goetzke, MD      . 0.9 %  sodium chloride infusion   Intravenous Continuous Kelby Fam Sahan Pen, MD      . 0.9 %  sodium chloride infusion   Intravenous Continuous Patrici Ranks, MD        Review of Systems  Constitutional: Positive for weight loss (about 13 lbs in the last month).  HENT: Negative.   Eyes: Negative.   Respiratory: Negative.   Cardiovascular: Negative.   Gastrointestinal: Negative.   Genitourinary: Negative.   Musculoskeletal: Negative.   Skin: Negative.   Neurological: Negative.   Endo/Heme/Allergies: Negative.   Psychiatric/Behavioral: Negative.   All other systems reviewed and are negative.  PHYSICAL EXAMINATION: ECOG PERFORMANCE STATUS: 1 - Symptomatic but completely ambulatory  Vitals:   04/14/16 0911  BP: 112/69  Pulse: (!) 108  Resp: 16  Temp: 98.9 F (37.2 C)     Physical Exam  Constitutional: He is oriented to person, place, and time and well-developed, well-nourished,  and in no distress.  HENT:  Head: Normocephalic and atraumatic.  Mouth/Throat: Oropharynx is clear and moist. No oropharyngeal exudate.  Eyes: Conjunctivae and EOM are normal. Pupils are equal, round, and reactive to light. No scleral icterus.  Neck: Normal range of motion. Neck supple.  Cardiovascular: Normal rate, regular rhythm and normal heart sounds.   Pulmonary/Chest: Effort normal and breath sounds normal.  Abdominal: Soft. Bowel sounds are normal. He exhibits no distension. There is no tenderness.  Ostomy intact, normal appearing stool  Musculoskeletal: Normal range of motion. He exhibits no edema.  Lymphadenopathy:    He has no cervical adenopathy.  Neurological: He is alert and oriented to person, place, and time. Gait normal.  Skin: Skin is warm and dry.  Psychiatric: Mood, memory, affect and judgment normal.  Nursing note and vitals reviewed.   LABORATORY DATA:  I have reviewed the data as listed below. Results for JABEZ, SMTIH (MRN [id number]) as of 04/14/2016 10:07  Ref. Range 04/08/2016 04:47  WBC Latest Ref Range: 4.0 - 10.5 K/uL 7.1  RBC Latest Ref Range: 4.22 - 5.81 MIL/uL 2.61 (L)  Hemoglobin Latest Ref Range: 13.0 - 17.0 g/dL 7.6 (L)  HCT Latest Ref Range: 39.0 - 52.0 % 22.6 (L)  MCV Latest Ref Range: 78.0 - 100.0 fL 86.6  MCH Latest Ref Range: 26.0 - 34.0 pg 29.1  MCHC Latest Ref Range: 30.0 - 36.0 g/dL 33.6  RDW Latest Ref Range: 11.5 - 15.5 % 15.1  Platelets Latest Ref Range: 150 - 400 K/uL 401 (H)    RADIOGRAPHIC STUDIES: I have personally reviewed the radiological reports as listed. Study Result   CLINICAL DATA:  Fever, obstipation  EXAM: ABDOMEN - 2 VIEW  COMPARISON:  04/04/2016  FINDINGS: Gastrostomy tube projects over the left upper abdomen. Gaseous distention of small bowel loops again noted with air-fluid levels compatible with small bowel obstruction. No significant change since prior study. No free air organomegaly. No acute bony  abnormality.  IMPRESSION: Small bowel obstruction pattern, unchanged.   Electronically Signed   By: Rolm Baptise M.D.   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 He is seen at [facility], Hepatitis C negative CT neck with contrast 06/25/2015 at [place] level 2 malignant LAD, largest node is 3.5 cm long axis. R worse than L carotid atherosclerosis and stenosis. CT chest with no acute or significant findings within the chest [facility] stay secondary to intractable n/v and mucositis Oropharyngeal candidiasis/esophageal candidiasis Odynophagia Fever Shingles Chemotherapy induced pancytopenia Anemia Sigmoid colectomy/colostomy Weight loss   Since our last visit, he had an exploratory laparotomy, sigmoid colectomy, and colostomy placement on [id number] with Dr. Leighton Ruff. He is recovering well. He is currently staying at the [facility].   Weight loss of about 13 lbs since the beginning of this month. He is actively working with the [facility] and dietician Ovid Curd to resolve this.   While speaking with Mike Craze, NP the patient did not feel he was in a good place to begin smoking cessation.  We will continue to discuss smoking cessation at future visits as he recovers.   Our office received a call from one of his sisters stating the patient had begun drinking again. When I asked the patient, he denied this. He is simply relying on his AA friends for support through his current difficulties.  Anemia postoperatively is noted. This will be rechecked moving forward. If it remains will workup further.   He will return for follow up in 2 weeks.   All questions were answered. The patient knows to call the clinic with any problems, questions or concerns.  This document serves as a record of services personally  performed by Ancil Linsey, MD. It was created on her behalf by Arlyce Harman, a trained medical scribe. The creation of this record is based on the scribe's personal observations and the provider's statements to them. This document has been checked and approved by the attending provider.  I have reviewed the above documentation for accuracy and completeness, and I agree with the above.  This note was electronically signed.    Molli Hazard, MD  04/14/2016 10:06 AM

## 2016-04-14 NOTE — Patient Instructions (Signed)
[place] at [facility] Discharge Instructions  RECOMMENDATIONS MADE BY THE CONSULTANT AND ANY TEST RESULTS WILL BE SENT TO YOUR REFERRING PHYSICIAN.  Return to clinic as scheduled in two weeks.    Thank you for choosing [place] at [facility] to provide your oncology and hematology care.  To afford each patient quality time with our provider, please arrive at least 15 minutes before your scheduled appointment time.   Beginning January 23rd 2017 lab work for the [facility] will be done in the  Main lab at [facility] on 1st floor. If you have a lab appointment with the [facility] please come in thru the  Main Entrance and check in at the main information desk  You need to re-schedule your appointment should you arrive 10 or more minutes late.  We strive to give you quality time with our providers, and arriving late affects you and other patients whose appointments are after yours.  Also, if you no show three or more times for appointments you may be dismissed from the clinic at the providers discretion.     Again, thank you for choosing [facility].  Our hope is that these requests will decrease the amount of time that you wait before being seen by our physicians.       _____________________________________________________________  Should you have questions after your visit to [facility], please contact our office at [phone] between the hours of 8:30 a.m. and 4:30 p.m.  Voicemails left after 4:30 p.m. will not be returned until the following business day.  For prescription refill requests, have your pharmacy contact our office.         Resources For Cancer Patients and their Caregivers ? American Cancer Society: Can assist with transportation, wigs, general needs, runs Look Good Feel Better.        253 306 8851 ? Cancer Care: Provides financial assistance, online support groups, medication/co-pay  assistance.  1-800-813-HOPE 9723283746) ? [place] Assists [place] cancer patients and their families through emotional , educational and financial support.  732-191-9959 ? [place] DSS Where to apply for food stamps, Medicaid and utility assistance. 281-147-5883 ? [facility]: Transportation to medical appointments. 870-475-5472 ? Social Security Administration: May apply for disability if have a Stage IV cancer. 618-216-6592 (978) 523-4690 ? [employer], Disability and Transit Services: Assists with nutrition, care and transit needs. [place] Support Programs: @10RELATIVEDAYS @ > Cancer Support Group  2nd Tuesday of the month 1pm-2pm, Journey Room  > Creative Journey  3rd Tuesday of the month 1130am-1pm, Journey Room  > Look Good Feel Better  1st Wednesday of the month 10am-12 noon, Journey Room (Call [facility] to register [phone])

## 2016-04-18 ENCOUNTER — Encounter (HOSPITAL_COMMUNITY): Payer: Self-pay | Admitting: Hematology & Oncology

## 2016-04-20 ENCOUNTER — Other Ambulatory Visit (HOSPITAL_COMMUNITY): Payer: Self-pay | Admitting: Emergency Medicine

## 2016-04-20 DIAGNOSIS — C090 Malignant neoplasm of tonsillar fossa: Secondary | ICD-10-CM

## 2016-04-20 MED ORDER — OXYCODONE HCL 5 MG/5ML PO SOLN: 5.0000 mg | ORAL | 0 refills | Status: DC | PRN

## 2016-04-20 MED ORDER — FENTANYL 12 MCG/HR TD PT72: 12.5000 ug | MEDICATED_PATCH | TRANSDERMAL | 0 refills | Status: DC

## 2016-04-20 MED ORDER — LORAZEPAM 0.5 MG PO TABS: 0.5000 mg | ORAL_TABLET | Freq: Four times a day (QID) | ORAL | 0 refills | Status: DC | PRN

## 2016-04-20 NOTE — Progress Notes (Signed)
Oxycodone, ativan, and fentanyl refilled.

## 2016-04-23 ENCOUNTER — Encounter (HOSPITAL_COMMUNITY): Payer: Self-pay

## 2016-04-29 ENCOUNTER — Encounter: Payer: Self-pay | Admitting: Dietician

## 2016-04-29 NOTE — Progress Notes (Signed)
Received call from pt who stated he is about to re-order tube feeding formula and was curious if any changes should be made.   RD responded, but got voicemail. RD left message stating that if he is tolerating the Jevity 1.2, there is no reason to change the formula. It sounds like he will get the same number of cases whether he is infusing 2-4 cans/day, so this should not impact the order at all.   [name] RD, LDN, CNSC Clinical Nutrition Pager: [phone] 04/29/2016 11:02 AM

## 2016-04-30 ENCOUNTER — Other Ambulatory Visit (HOSPITAL_COMMUNITY): Payer: Self-pay | Admitting: Emergency Medicine

## 2016-04-30 MED ORDER — OXYCODONE HCL 5 MG/5ML PO SOLN: 5.0000 mg | ORAL | 0 refills | Status: DC | PRN

## 2016-05-03 NOTE — Assessment & Plan Note (Addendum)
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  Treatment course complicated by severe chemoradiation-induced stomatitis.  Labs today: CBC diff, CMET.  I personally reviewed and went over laboratory results with the patient.  The results are noted within this dictation.  Weight is up 3 lbs since being seen last.  He continues to use 4-5 cans of Jevity daily.  He is using MiraLax for his bowel regimen.   He requests a refill on his pain medication.  He is not yet due, but this will save him a trip to the clinic for the Rx.  [place] Controlled Substance Reporting System.  He had it last filled on 05/01/2016.  He has follow-up with Dr. Marcello Moores in ~ 3 weeks.  He reports that conversation will include timing of reversal of colostomy.  Return in 4 weeks for follow-up.

## 2016-05-03 NOTE — Progress Notes (Signed)
Frank Savage, MD [address] [place] [address]  Cancer of tonsillar fossa [facility]) - Plan: CBC with Differential, Comprehensive metabolic panel, CBC with Differential, Comprehensive metabolic panel  CURRENT THERAPY: Surveillance per NCCN guidelines and supportive care.  INTERVAL HISTORY: Frank Castillo 65 y.o. 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 Treatment course complicated by severe chemoradiation-induced stomatitis.    Cancer of tonsillar fossa ([place])   08/02/2015 Initial Diagnosis    Cancer of tonsillar fossa ([place])      08/20/2015 Procedure    Port-a-cath placed by IR      08/20/2015 Procedure    G-tube placed by IR      08/21/2015 - 10/09/2015 Radiation Therapy    70 Gy, Dr. Isidore Moos.      08/21/2015 - 09/10/2015 Chemotherapy    Cisplatin every 21 days with XRT.      08/28/2015 Adverse Reaction    Tinnitis, Cisplatin-induced.      09/11/2015 - 09/30/2015 Chemotherapy    Carboplatin/5FU      09/20/2015 - 09/21/2015 Hospital Admission    Intractable nausea/vomiting. Severe Mucositis      10/17/2015 - 10/20/2015 Hospital Admission    Esophageal/oral candidasis. Shingles. Fever. Chemotherapy induced pancytopenia      01/24/2016 PET scan    Resolution of the left tonsillar mass and left neck adenopathy. No findings for residual or locally recurrent tumor, adenopathy or distant metastatic disease.      03/28/2016 - 04/08/2016 Hospital Admission    Admitted with severe abdominal pain. Found to diffuse peritonitis from perforated sigmoid colon requiring colostomy. Discharged to [facility] in [place].            Weight is up 3 lbs since last being seen in the clinic.  He continues to use  Jevity.  His colostomy is producing stool appropriately.  He is noting some gas following the use of MiraLax as part of his bowel regimen.    He reports an upcoming follow-up appointment with Dr. Marcello Moores.  He is excited to report that she may discuss the timing of reversal at that appointment.  Review of Systems  Constitutional: Negative.  Negative for chills, fever and weight loss.  HENT: Negative.   Eyes: Negative.   Respiratory: Negative.  Negative for cough.   Cardiovascular: Negative.  Negative for chest pain.  Gastrointestinal: Negative for constipation, diarrhea, nausea and vomiting.  Genitourinary: Negative.   Musculoskeletal: Negative.   Skin: Negative.   Neurological: Negative.  Negative for weakness.  Endo/Heme/Allergies: Negative.   Psychiatric/Behavioral: Negative.     Past Medical History:  Diagnosis Date  . Cirrhosis ([place])   . Hypertension   . Portal hypertension ([place])   . Tonsillar mass    left    Past Surgical History:  Procedure Laterality Date  . HERNIA REPAIR Right   . LAPAROTOMY N/A 03/28/2016   Procedure: EXPLORATORY LAPAROTOMY, SIGMOID COLECTOMY, COLOSTOMY;  Surgeon: Leighton Ruff, MD;  Location: [facility];  Service: General;  Laterality: N/A;  . MOUTH SURGERY    . TONSILLECTOMY Left 07/09/2015   Procedure: LEFT TONSILLECTOMY;  Surgeon: Leta Baptist, MD;  Location: [facility];  Service: ENT;  Laterality: Left;    Family History  Problem Relation Age of Onset  .  Macular degeneration Mother   . Alcoholism Father     Social History   Social History  . Marital status: Single    Spouse name: N/A  . Number of children: 0  . Years of education: N/A   Social History Main Topics  . Smoking status: Current Every Day Smoker    Packs/day: 0.50    Types: Cigarettes  . Smokeless tobacco: Never Used  . Alcohol use No     Comment: former abuse- quit 09/2011  . Drug use: No  . Sexual activity: Not Asked   Other Topics Concern  . None   Social  History Narrative   Divorced.   No children.   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   Currently smoking approximately one pack per day.   Has been smoking off/on since age 25.     PHYSICAL EXAMINATION  ECOG PERFORMANCE STATUS: 1 - Symptomatic but completely ambulatory  Vitals:   05/04/16 0925  BP: 108/64  Pulse: 89  Resp: 16  Temp: 98.8 F (37.1 C)    GENERAL:alert, no distress, cachectic, comfortable, cooperative, smiling and accompanied by his sister, Frank Castillo, and friend SKIN: skin color, texture, turgor are normal, no rashes or significant lesions HEAD: Normocephalic, No masses, lesions, tenderness or abnormalities EYES: normal, EOMI, Conjunctiva are pink and non-injected EARS: External ears normal OROPHARYNX:lips, buccal mucosa, and tongue normal and mucous membranes are moist  NECK: supple, trachea midline LYMPH:  no palpable lymphadenopathy BREAST:not examined LUNGS: clear to auscultation and percussion HEART: regular rate & rhythm, no murmurs, no gallops, S1 normal and S2 normal ABDOMEN:abdomen soft, normal bowel sounds and colostomy producing stool appropriately and surgical site with healthy granulating tissue without significant erythema.  Discharge is white/yellow and without any odor.  Cleanly re-dressed today in the clinic (wet to dry) BACK: Back symmetric, no curvature. EXTREMITIES:less then 2 second capillary refill, no joint deformities, effusion, or inflammation, no edema, no skin discoloration, no clubbing, no cyanosis  NEURO: alert & oriented x 3 with fluent speech, no focal motor/sensory deficits, gait normal   LABORATORY DATA: CBC    Component Value Date/Time   WBC 7.1 04/08/2016 0447   RBC 2.61 (L) 04/08/2016 0447   HGB 7.6 (L) 04/08/2016 0447   HGB 11.5 (L) 10/21/2011 1614   HCT 22.6 (L) 04/08/2016 0447   HCT 34.6 (L) 10/21/2011 1614   PLT 401 (H) 04/08/2016 0447   PLT 209 10/21/2011 1614   MCV 86.6 04/08/2016 0447   MCV 101  (H) 10/21/2011 1614   MCH 29.1 04/08/2016 0447   MCHC 33.6 04/08/2016 0447   RDW 15.1 04/08/2016 0447   RDW 14.0 10/21/2011 1614   LYMPHSABS 0.8 03/28/2016 0824   LYMPHSABS 1.9 10/20/2011 0609   MONOABS 0.4 03/28/2016 0824   MONOABS 1.1 (H) 10/20/2011 0609   EOSABS 0.1 03/28/2016 0824   EOSABS 0.1 10/20/2011 0609   BASOSABS 0.1 03/28/2016 0824   BASOSABS 0.1 10/20/2011 0609      Chemistry      Component Value Date/Time   NA 133 (L) 04/05/2016 0359   NA 132 (L) 10/21/2011 1614   K 3.4 (L) 04/05/2016 0359   K 3.6 10/21/2011 1614   CL 102 04/05/2016 0359   CL 92 (L) 10/21/2011 1614   CO2 26 04/05/2016 0359   CO2 32 10/21/2011 1614   BUN 8 04/05/2016 0359   BUN 7 10/21/2011 1614   CREATININE 0.63 04/05/2016 0359   CREATININE 0.73 10/21/2011 1614  Component Value Date/Time   CALCIUM 7.4 (L) 04/05/2016 0359   CALCIUM 7.9 (L) 10/21/2011 1614   ALKPHOS 54 03/28/2016 0824   ALKPHOS 83 10/21/2011 1614   AST 20 03/28/2016 0824   AST 97 (H) 10/21/2011 1614   ALT 13 (L) 03/28/2016 0824   ALT 34 10/21/2011 1614   BILITOT 0.6 03/28/2016 0824   BILITOT 1.1 (H) 10/21/2011 1614        PENDING LABS:   RADIOGRAPHIC STUDIES:  Dg Abd 2 Views  Result Date: 04/07/2016 CLINICAL DATA:  Fever, obstipation EXAM: ABDOMEN - 2 VIEW COMPARISON:  04/04/2016 FINDINGS: Gastrostomy tube projects over the left upper abdomen. Gaseous distention of small bowel loops again noted with air-fluid levels compatible with small bowel obstruction. No significant change since prior study. No free air organomegaly. No acute bony abnormality. IMPRESSION: Small bowel obstruction pattern, unchanged. Electronically Signed   By: Rolm Baptise M.D.   On: 04/07/2016 10:50   Dg Abd Acute W/chest  Result Date: 04/04/2016 CLINICAL DATA:  Fever, tobacco use and hypertension. History of cirrhosis. EXAM: DG ABDOMEN ACUTE W/ 1V CHEST COMPARISON:  PET-CT 07/30/2015 FINDINGS: The heart is top-normal in size. There is  aortic atherosclerosis. Lungs mildly hyperinflated with emphysematous changes bilaterally. Port catheter tip is seen in the distal SVC. Dilated small bowel loops out of proportion to large bowel are seen in a pattern consistent with high-grade small bowel obstruction. Gastrostomy tube projects over the left quadrant. Left lower quadrant colostomy is also seen. There is no free air. IMPRESSION: Abnormal bowel gas pattern consistent with high-grade small bowel obstruction. Port catheter tip in the distal SVC. Gastrostomy tube tip in the left upper quadrant. Left lower quadrant colostomy. Aortic atherosclerosis. Mild hyperinflation  lungs without acute pulmonary consolidation. Electronically Signed   By: Ashley Royalty M.D.   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  Treatment course complicated by severe chemoradiation-induced stomatitis.  Labs today: CBC diff, CMET.  I personally reviewed and went over laboratory results with the patient.  The results are noted within this dictation.  Weight is up 3 lbs since being seen last.  He continues to use 4-5 cans of Jevity daily.  He is using MiraLax for his bowel regimen.   He requests a refill on his pain medication.  He is not yet due, but this will save him a trip to the clinic for the Rx.  [place] Controlled Substance Reporting System.  He had it last filled on 05/01/2016.  He has follow-up with Dr. Marcello Moores in ~ 3 weeks.  He reports that conversation will include timing of reversal of colostomy.  Return in 4 weeks for follow-up.   ORDERS PLACED FOR THIS ENCOUNTER: Orders Placed This Encounter  Procedures  . CBC  with Differential  . Comprehensive metabolic panel    MEDICATIONS PRESCRIBED THIS ENCOUNTER: Meds ordered this encounter  Medications  . oxyCODONE (ROXICODONE) 5 MG/5ML solution    Sig: Take 5 mLs (5 mg total) by mouth every 4 (four) hours as needed for severe pain.    Dispense:  250 mL    Refill:  0    Order Specific Question:   Supervising Provider    Answer:  Patrici Ranks [id number]    THERAPY PLAN:  NCCN guidelines for surveillance of Head and Neck cancer recommends (2.2017):  A. H+P every 1-3 months for year 1  B. H+P every 2-6 months for year 2  C. H+P every 4-8 months for years 3-5  D. H+P every year for years greater than 5  E. Post-treatment baseline imaging of primary (and neck, if treated) recommended within 6 months of treatment (category 2B).   F. Chest imaging as clinically indicated for patients with smoking history.  G. Further re-imaging as indicated based on worrisome or equivocal signs/symptoms, smoking history, and areas inaccessible to clinical examination.  H. Routine annual imaging may be indicated in areas difficult to visualize on exam.  I. TSH every 6-12 months if neck irradiated.  J. Dental evaluation   1. Recommended for oral cavity and sites exposed to significant intraoral radiation treatment.  K. 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  L. Supportive care and rehabilitation   1. Speech/hearing and swallowing evaluation and rehabilitation as clinically indicated.   2. Nutritional evaluation and rehabilitation as clinically indicated until nutritional status is stabilized.   3. Ongoing surveillance for depression   4. Smoking cessation and alcohol counseling as clinically indicated.  M. For response assessment immediately after chemoradiation or RT (see FOLL-A 2 of 2).  N. Integration of survivorship care and care plan within 1 year, complementary to ongoing involvement from a head and neck oncologist.   All  questions were answered. The patient knows to call the clinic with any problems, questions or concerns. We can certainly see the patient much sooner if necessary.  Patient and plan discussed with Dr. Ancil Linsey and she is in agreement with the aforementioned.   This note is electronically signed by: Doy Mince 05/04/2016 10:28 AM

## 2016-05-04 ENCOUNTER — Encounter (HOSPITAL_COMMUNITY): Payer: BLUE CROSS/BLUE SHIELD | Attending: Hematology & Oncology | Admitting: Oncology

## 2016-05-04 ENCOUNTER — Other Ambulatory Visit (HOSPITAL_COMMUNITY): Payer: Self-pay | Admitting: Oncology

## 2016-05-04 ENCOUNTER — Encounter (HOSPITAL_COMMUNITY): Payer: BLUE CROSS/BLUE SHIELD

## 2016-05-04 ENCOUNTER — Encounter (HOSPITAL_COMMUNITY): Payer: Self-pay | Admitting: Oncology

## 2016-05-04 VITALS — BP 108/64 | HR 89 | Temp 98.8°F | Resp 16 | Wt 140.2 lb

## 2016-05-04 DIAGNOSIS — C090 Malignant neoplasm of tonsillar fossa: Secondary | ICD-10-CM | POA: Diagnosis not present

## 2016-05-04 DIAGNOSIS — C099 Malignant neoplasm of tonsil, unspecified: Secondary | ICD-10-CM | POA: Diagnosis present

## 2016-05-04 DIAGNOSIS — R112 Nausea with vomiting, unspecified: Secondary | ICD-10-CM | POA: Diagnosis present

## 2016-05-04 DIAGNOSIS — Z95828 Presence of other vascular implants and grafts: Secondary | ICD-10-CM | POA: Insufficient documentation

## 2016-05-04 DIAGNOSIS — R05 Cough: Secondary | ICD-10-CM | POA: Diagnosis present

## 2016-05-04 DIAGNOSIS — B370 Candidal stomatitis: Secondary | ICD-10-CM | POA: Insufficient documentation

## 2016-05-04 DIAGNOSIS — L739 Follicular disorder, unspecified: Secondary | ICD-10-CM | POA: Diagnosis present

## 2016-05-04 DIAGNOSIS — D649 Anemia, unspecified: Secondary | ICD-10-CM

## 2016-05-04 LAB — CBC WITH DIFFERENTIAL/PLATELET
BASOS PCT: 1 %
Basophils Absolute: 0.1 10*3/uL (ref 0.0–0.1)
EOS ABS: 0.1 10*3/uL (ref 0.0–0.7)
EOS PCT: 1 %
HCT: 24.4 % — ABNORMAL LOW (ref 39.0–52.0)
HEMOGLOBIN: 7.5 g/dL — AB (ref 13.0–17.0)
LYMPHS ABS: 0.6 10*3/uL — AB (ref 0.7–4.0)
Lymphocytes Relative: 5 %
MCH: 26 pg (ref 26.0–34.0)
MCHC: 30.7 g/dL (ref 30.0–36.0)
MCV: 84.7 fL (ref 78.0–100.0)
MONO ABS: 1 10*3/uL (ref 0.1–1.0)
MONOS PCT: 9 %
NEUTROS PCT: 84 %
Neutro Abs: 9.2 10*3/uL — ABNORMAL HIGH (ref 1.7–7.7)
PLATELETS: 624 10*3/uL — AB (ref 150–400)
RBC: 2.88 MIL/uL — ABNORMAL LOW (ref 4.22–5.81)
RDW: 17.2 % — AB (ref 11.5–15.5)
WBC: 11 10*3/uL — ABNORMAL HIGH (ref 4.0–10.5)

## 2016-05-04 LAB — COMPREHENSIVE METABOLIC PANEL
ALBUMIN: 2.9 g/dL — AB (ref 3.5–5.0)
ALK PHOS: 50 U/L (ref 38–126)
ALT: 8 U/L — ABNORMAL LOW (ref 17–63)
ANION GAP: 9 (ref 5–15)
AST: 11 U/L — ABNORMAL LOW (ref 15–41)
BUN: 8 mg/dL (ref 6–20)
CALCIUM: 8.9 mg/dL (ref 8.9–10.3)
CO2: 27 mmol/L (ref 22–32)
Chloride: 94 mmol/L — ABNORMAL LOW (ref 101–111)
Creatinine, Ser: 0.71 mg/dL (ref 0.61–1.24)
GFR calc non Af Amer: >60 mL/min (ref >60)
GLUCOSE: 97 mg/dL (ref 65–99)
POTASSIUM: 4.6 mmol/L (ref 3.5–5.1)
SODIUM: 130 mmol/L — AB (ref 135–145)
TOTAL PROTEIN: 7.2 g/dL (ref 6.5–8.1)
Total Bilirubin: 0.4 mg/dL (ref 0.3–1.2)

## 2016-05-04 MED ORDER — OXYCODONE HCL 5 MG/5ML PO SOLN: 5.0000 mg | ORAL | 0 refills | Status: DC | PRN

## 2016-05-04 NOTE — Patient Instructions (Addendum)
[place] at [facility] Discharge Instructions  RECOMMENDATIONS MADE BY THE CONSULTANT AND ANY TEST RESULTS WILL BE SENT TO YOUR REFERRING PHYSICIAN.  You saw Kirby Crigler, PA-C, today. Labs today. Follow up in 4 weeks. See Amy at checkout for appointments.  Thank you for choosing [place] at [facility] to provide your oncology and hematology care.  To afford each patient quality time with our provider, please arrive at least 15 minutes before your scheduled appointment time.   Beginning January 23rd 2017 lab work for the [facility] will be done in the  Main lab at [facility] on 1st floor. If you have a lab appointment with the [facility] please come in thru the  Main Entrance and check in at the main information desk  You need to re-schedule your appointment should you arrive 10 or more minutes late.  We strive to give you quality time with our providers, and arriving late affects you and other patients whose appointments are after yours.  Also, if you no show three or more times for appointments you may be dismissed from the clinic at the providers discretion.     Again, thank you for choosing [facility].  Our hope is that these requests will decrease the amount of time that you wait before being seen by our physicians.       _____________________________________________________________  Should you have questions after your visit to [facility], please contact our office at [phone] between the hours of 8:30 a.m. and 4:30 p.m.  Voicemails left after 4:30 p.m. will not be returned until the following business day.  For prescription refill requests, have your pharmacy contact our office.         Resources For Cancer Patients and their Caregivers ? American Cancer Society: Can assist with transportation, wigs, general needs, runs Look Good Feel Better.        217-792-4869 ? Cancer Care: Provides  financial assistance, online support groups, medication/co-pay assistance.  1-800-813-HOPE 337-325-6551) ? [place] Assists [place] cancer patients and their families through emotional , educational and financial support.  575-220-1445 ? [place] DSS Where to apply for food stamps, Medicaid and utility assistance. 289-036-8706 ? RCATS: Transportation to medical appointments. 708-330-4624 ? Social Security Administration: May apply for disability if have a Stage IV cancer. (559)061-3294 706-312-2962 ? [employer], Disability and Transit Services: Assists with nutrition, care and transit needs. [place] Support Programs: @10RELATIVEDAYS @ > Cancer Support Group  2nd Tuesday of the month 1pm-2pm, Journey Room  > Creative Journey  3rd Tuesday of the month 1130am-1pm, Journey Room  > Look Good Feel Better  1st Wednesday of the month 10am-12 noon, Journey Room (Call [place] to register [phone])

## 2016-05-06 ENCOUNTER — Other Ambulatory Visit (HOSPITAL_COMMUNITY): Payer: Self-pay | Admitting: Oncology

## 2016-05-06 ENCOUNTER — Other Ambulatory Visit (HOSPITAL_COMMUNITY): Payer: Self-pay

## 2016-05-06 ENCOUNTER — Encounter (HOSPITAL_COMMUNITY): Payer: BLUE CROSS/BLUE SHIELD

## 2016-05-06 DIAGNOSIS — D649 Anemia, unspecified: Secondary | ICD-10-CM

## 2016-05-06 DIAGNOSIS — C099 Malignant neoplasm of tonsil, unspecified: Secondary | ICD-10-CM | POA: Diagnosis not present

## 2016-05-06 LAB — CBC WITH DIFFERENTIAL/PLATELET
BASOS PCT: 1 %
Basophils Absolute: 0.1 10*3/uL (ref 0.0–0.1)
EOS ABS: 0.1 10*3/uL (ref 0.0–0.7)
EOS PCT: 1 %
HCT: 22.4 % — ABNORMAL LOW (ref 39.0–52.0)
HEMOGLOBIN: 7 g/dL — AB (ref 13.0–17.0)
LYMPHS ABS: 1 10*3/uL (ref 0.7–4.0)
Lymphocytes Relative: 12 %
MCH: 26.4 pg (ref 26.0–34.0)
MCHC: 31.3 g/dL (ref 30.0–36.0)
MCV: 84.5 fL (ref 78.0–100.0)
MONOS PCT: 9 %
Monocytes Absolute: 0.8 10*3/uL (ref 0.1–1.0)
NEUTROS PCT: 77 %
Neutro Abs: 6.4 10*3/uL (ref 1.7–7.7)
PLATELETS: 569 10*3/uL — AB (ref 150–400)
RBC: 2.65 MIL/uL — ABNORMAL LOW (ref 4.22–5.81)
RDW: 17.4 % — AB (ref 11.5–15.5)
WBC: 8.3 10*3/uL (ref 4.0–10.5)

## 2016-05-06 LAB — FERRITIN: Ferritin: 278 ng/mL (ref 24–336)

## 2016-05-06 LAB — VITAMIN B12: Vitamin B-12: 669 pg/mL (ref 180–914)

## 2016-05-06 LAB — IRON AND TIBC
Iron: 15 ug/dL — ABNORMAL LOW (ref 45–182)
SATURATION RATIOS: 6 % — AB (ref 17.9–39.5)
TIBC: 235 ug/dL — AB (ref 250–450)
UIBC: 220 ug/dL

## 2016-05-06 LAB — SAMPLE TO BLOOD BANK

## 2016-05-06 LAB — FOLATE: FOLATE: 10.8 ng/mL (ref >5.9)

## 2016-05-06 LAB — PREPARE RBC (CROSSMATCH)

## 2016-05-06 NOTE — Progress Notes (Unsigned)
Patient called to inform of the need for him to get two units of blood and that he needs to come up to the cancer center Friday after he gets blood to get hemmoccult cards from [place] to check his stool for blood.  He verbalized understanding.  Chase in the lab called and informed that Frank Castillo was to get blood in short stay on Friday and all orders were entered by Robynn Pane, PA and verified by myself.

## 2016-05-07 ENCOUNTER — Other Ambulatory Visit (HOSPITAL_COMMUNITY): Payer: Self-pay | Admitting: Oncology

## 2016-05-07 ENCOUNTER — Encounter (HOSPITAL_COMMUNITY): Payer: Self-pay | Admitting: Oncology

## 2016-05-07 DIAGNOSIS — D509 Iron deficiency anemia, unspecified: Secondary | ICD-10-CM

## 2016-05-07 HISTORY — DX: Iron deficiency anemia, unspecified: D50.9

## 2016-05-08 ENCOUNTER — Encounter (HOSPITAL_COMMUNITY)
Admission: RE | Admit: 2016-05-08 | Discharge: 2016-05-08 | Disposition: A | Discharge status: Home / Self Care | Payer: BLUE CROSS/BLUE SHIELD | Source: Ambulatory Visit | Attending: Hematology & Oncology | Admitting: Hematology & Oncology

## 2016-05-08 ENCOUNTER — Encounter (HOSPITAL_COMMUNITY): Payer: Self-pay

## 2016-05-08 DIAGNOSIS — D649 Anemia, unspecified: Secondary | ICD-10-CM | POA: Insufficient documentation

## 2016-05-08 MED ORDER — ACETAMINOPHEN 325 MG PO TABS
650.0000 mg | ORAL_TABLET | Freq: Once | ORAL | Status: AC
  Administered 2016-05-08: 650 mg via ORAL
  Filled 2016-05-08: qty 2

## 2016-05-08 MED ORDER — SODIUM CHLORIDE 0.9 % IV SOLN
250.0000 mL | Freq: Once | INTRAVENOUS | Status: AC
  Administered 2016-05-08: 250 mL via INTRAVENOUS

## 2016-05-08 MED ORDER — HEPARIN SOD (PORK) LOCK FLUSH 100 UNIT/ML IV SOLN
500.0000 [IU] | Freq: Every day | INTRAVENOUS | Status: AC | PRN
  Administered 2016-05-08: 500 [IU]
  Filled 2016-05-08: qty 5

## 2016-05-08 MED ORDER — SODIUM CHLORIDE 0.9% FLUSH: 10.0000 mL | INTRAVENOUS | Status: DC | PRN

## 2016-05-08 MED ORDER — HEPARIN SOD (PORK) LOCK FLUSH 100 UNIT/ML IV SOLN: 250.0000 [IU] | INTRAVENOUS | Status: DC | PRN

## 2016-05-08 MED ORDER — SODIUM CHLORIDE 0.9% FLUSH: 3.0000 mL | INTRAVENOUS | Status: DC | PRN

## 2016-05-08 MED ORDER — DIPHENHYDRAMINE HCL 25 MG PO CAPS
25.0000 mg | ORAL_CAPSULE | Freq: Once | ORAL | Status: AC
  Administered 2016-05-08: 25 mg via ORAL
  Filled 2016-05-08: qty 1

## 2016-05-09 LAB — TYPE AND SCREEN
BLOOD PRODUCT EXPIRATION DATE: 201712172359
Blood Product Expiration Date: 201712172359
ISSUE DATE / TIME: 201712150814
ISSUE DATE / TIME: 201712151003
UNIT TYPE AND RH: 6200
Unit Type and Rh: 6200

## 2016-05-10 ENCOUNTER — Encounter (HOSPITAL_COMMUNITY): Payer: Self-pay | Admitting: Adult Health

## 2016-05-12 ENCOUNTER — Other Ambulatory Visit (HOSPITAL_COMMUNITY): Payer: Self-pay

## 2016-05-12 DIAGNOSIS — D508 Other iron deficiency anemias: Secondary | ICD-10-CM

## 2016-05-12 DIAGNOSIS — D649 Anemia, unspecified: Secondary | ICD-10-CM | POA: Diagnosis not present

## 2016-05-12 DIAGNOSIS — C090 Malignant neoplasm of tonsillar fossa: Secondary | ICD-10-CM

## 2016-05-12 DIAGNOSIS — D6481 Anemia due to antineoplastic chemotherapy: Secondary | ICD-10-CM

## 2016-05-12 DIAGNOSIS — T451X5A Adverse effect of antineoplastic and immunosuppressive drugs, initial encounter: Secondary | ICD-10-CM

## 2016-05-12 LAB — OCCULT BLOOD X 1 CARD TO LAB, STOOL
FECAL OCCULT BLD: NEGATIVE
Fecal Occult Bld: NEGATIVE
Fecal Occult Bld: NEGATIVE

## 2016-05-13 ENCOUNTER — Other Ambulatory Visit (HOSPITAL_COMMUNITY): Payer: Self-pay | Admitting: Emergency Medicine

## 2016-05-13 ENCOUNTER — Other Ambulatory Visit (HOSPITAL_COMMUNITY): Payer: Self-pay | Admitting: *Deleted

## 2016-05-13 DIAGNOSIS — C090 Malignant neoplasm of tonsillar fossa: Secondary | ICD-10-CM

## 2016-05-13 DIAGNOSIS — C099 Malignant neoplasm of tonsil, unspecified: Secondary | ICD-10-CM

## 2016-05-13 MED ORDER — LORAZEPAM 0.5 MG PO TABS: 0.5000 mg | ORAL_TABLET | Freq: Four times a day (QID) | ORAL | 0 refills | Status: DC | PRN

## 2016-05-13 MED ORDER — OXYCODONE HCL 5 MG/5ML PO SOLN: 5.0000 mg | ORAL | 0 refills | Status: DC | PRN

## 2016-05-13 MED ORDER — SODIUM FLUORIDE 1.1 % DT GEL: DENTAL | 2 refills | Status: DC

## 2016-05-15 ENCOUNTER — Encounter (HOSPITAL_COMMUNITY): Payer: Self-pay

## 2016-05-15 ENCOUNTER — Encounter (HOSPITAL_BASED_OUTPATIENT_CLINIC_OR_DEPARTMENT_OTHER): Payer: BLUE CROSS/BLUE SHIELD

## 2016-05-15 VITALS — BP 127/64 | HR 69 | Temp 98.0°F | Resp 18

## 2016-05-15 DIAGNOSIS — D509 Iron deficiency anemia, unspecified: Secondary | ICD-10-CM | POA: Diagnosis not present

## 2016-05-15 MED ORDER — SODIUM CHLORIDE 0.9 % IV SOLN
510.0000 mg | Freq: Once | INTRAVENOUS | Status: AC
  Administered 2016-05-15: 510 mg via INTRAVENOUS
  Filled 2016-05-15: qty 17

## 2016-05-15 MED ORDER — SODIUM CHLORIDE 0.9 % IV SOLN
Freq: Once | INTRAVENOUS | Status: AC
  Administered 2016-05-15: 14:00:00 via INTRAVENOUS

## 2016-05-15 MED ORDER — HEPARIN SOD (PORK) LOCK FLUSH 100 UNIT/ML IV SOLN
500.0000 [IU] | Freq: Once | INTRAVENOUS | Status: AC
  Administered 2016-05-15: 500 [IU] via INTRAVENOUS

## 2016-05-15 MED ORDER — HEPARIN SOD (PORK) LOCK FLUSH 100 UNIT/ML IV SOLN
INTRAVENOUS | Status: AC
  Filled 2016-05-15: qty 5

## 2016-05-15 NOTE — Patient Instructions (Signed)
[place] at [facility] Discharge Instructions  RECOMMENDATIONS MADE BY THE CONSULTANT AND ANY TEST RESULTS WILL BE SENT TO YOUR REFERRING PHYSICIAN.  Feraheme given Follow up as scheduled.  Thank you for choosing [place] at [facility] to provide your oncology and hematology care.  To afford each patient quality time with our provider, please arrive at least 15 minutes before your scheduled appointment time.   Beginning January 23rd 2017 lab work for the [facility] will be done in the  Main lab at [facility] on 1st floor. If you have a lab appointment with the [facility] please come in thru the  Main Entrance and check in at the main information desk  You need to re-schedule your appointment should you arrive 10 or more minutes late.  We strive to give you quality time with our providers, and arriving late affects you and other patients whose appointments are after yours.  Also, if you no show three or more times for appointments you may be dismissed from the clinic at the providers discretion.     Again, thank you for choosing [facility].  Our hope is that these requests will decrease the amount of time that you wait before being seen by our physicians.       _____________________________________________________________  Should you have questions after your visit to [facility], please contact our office at [phone] between the hours of 8:30 a.m. and 4:30 p.m.  Voicemails left after 4:30 p.m. will not be returned until the following business day.  For prescription refill requests, have your pharmacy contact our office.         Resources For Cancer Patients and their Caregivers ? American Cancer Society: Can assist with transportation, wigs, general needs, runs Look Good Feel Better.        705-396-5688 ? Cancer Care: Provides financial assistance, online support groups, medication/co-pay  assistance.  1-800-813-HOPE 705-483-6624) ? [place] Assists [place] cancer patients and their families through emotional , educational and financial support.  (931)586-5953 ? [place] DSS Where to apply for food stamps, Medicaid and utility assistance. 7177373445 ? RCATS: Transportation to medical appointments. 3066727742 ? Social Security Administration: May apply for disability if have a Stage IV cancer. (979) 170-2378 774-149-2381 ? [employer], Disability and Transit Services: Assists with nutrition, care and transit needs. [place] Support Programs: @10RELATIVEDAYS @ > Cancer Support Group  2nd Tuesday of the month 1pm-2pm, Journey Room  > Creative Journey  3rd Tuesday of the month 1130am-1pm, Journey Room  > Look Good Feel Better  1st Wednesday of the month 10am-12 noon, Journey Room (Call [place] to register [phone])

## 2016-05-15 NOTE — Progress Notes (Signed)
Feraheme given today, no problems. Patient tolerated it well. Vitals stable and discharged ambulatory from clinic. Follow up as scheduled.

## 2016-05-21 ENCOUNTER — Other Ambulatory Visit (HOSPITAL_COMMUNITY): Payer: Self-pay | Admitting: Emergency Medicine

## 2016-05-21 DIAGNOSIS — R300 Dysuria: Secondary | ICD-10-CM

## 2016-05-21 DIAGNOSIS — C090 Malignant neoplasm of tonsillar fossa: Secondary | ICD-10-CM

## 2016-05-21 DIAGNOSIS — D508 Other iron deficiency anemias: Secondary | ICD-10-CM

## 2016-05-21 MED ORDER — OXYCODONE-ACETAMINOPHEN 5-325 MG PO TABS: 1.0000 | ORAL_TABLET | ORAL | 0 refills | Status: DC | PRN

## 2016-05-26 ENCOUNTER — Telehealth (HOSPITAL_COMMUNITY): Payer: Self-pay | Admitting: Emergency Medicine

## 2016-05-26 ENCOUNTER — Encounter (HOSPITAL_COMMUNITY): Payer: BLUE CROSS/BLUE SHIELD | Attending: Hematology & Oncology

## 2016-05-26 DIAGNOSIS — C090 Malignant neoplasm of tonsillar fossa: Secondary | ICD-10-CM | POA: Insufficient documentation

## 2016-05-26 DIAGNOSIS — F17210 Nicotine dependence, cigarettes, uncomplicated: Secondary | ICD-10-CM | POA: Diagnosis not present

## 2016-05-26 DIAGNOSIS — R300 Dysuria: Secondary | ICD-10-CM

## 2016-05-26 DIAGNOSIS — D649 Anemia, unspecified: Secondary | ICD-10-CM

## 2016-05-26 LAB — URINALYSIS, ROUTINE W REFLEX MICROSCOPIC
Bilirubin Urine: NEGATIVE
GLUCOSE, UA: NEGATIVE mg/dL
HGB URINE DIPSTICK: NEGATIVE
Ketones, ur: NEGATIVE mg/dL
Leukocytes, UA: NEGATIVE
Nitrite: NEGATIVE
Protein, ur: NEGATIVE mg/dL
SPECIFIC GRAVITY, URINE: 1.004 — AB (ref 1.005–1.030)
pH: 7 (ref 5.0–8.0)

## 2016-05-26 NOTE — Telephone Encounter (Signed)
Pt called and stated that he was still having burning when he urinated.  He could stop by and we would do a urine test for him.  He is coming by this afternoon.  Orders are in.

## 2016-05-27 ENCOUNTER — Encounter (HOSPITAL_COMMUNITY): Payer: BLUE CROSS/BLUE SHIELD

## 2016-05-27 ENCOUNTER — Other Ambulatory Visit (HOSPITAL_COMMUNITY): Payer: Self-pay | Admitting: Emergency Medicine

## 2016-05-27 DIAGNOSIS — C090 Malignant neoplasm of tonsillar fossa: Secondary | ICD-10-CM | POA: Diagnosis not present

## 2016-05-27 DIAGNOSIS — D508 Other iron deficiency anemias: Secondary | ICD-10-CM

## 2016-05-27 DIAGNOSIS — D649 Anemia, unspecified: Secondary | ICD-10-CM

## 2016-05-27 LAB — COMPREHENSIVE METABOLIC PANEL
ALK PHOS: 43 U/L (ref 38–126)
ALT: 9 U/L — AB (ref 17–63)
AST: 13 U/L — AB (ref 15–41)
Albumin: 3.8 g/dL (ref 3.5–5.0)
Anion gap: 5 (ref 5–15)
BUN: 12 mg/dL (ref 6–20)
CALCIUM: 8.9 mg/dL (ref 8.9–10.3)
CHLORIDE: 96 mmol/L — AB (ref 101–111)
CO2: 29 mmol/L (ref 22–32)
CREATININE: 0.61 mg/dL (ref 0.61–1.24)
GFR calc Af Amer: >60 mL/min (ref >60)
GFR calc non Af Amer: >60 mL/min (ref >60)
GLUCOSE: 69 mg/dL (ref 65–99)
Potassium: 3.9 mmol/L (ref 3.5–5.1)
SODIUM: 130 mmol/L — AB (ref 135–145)
Total Bilirubin: 0.2 mg/dL — ABNORMAL LOW (ref 0.3–1.2)
Total Protein: 6.9 g/dL (ref 6.5–8.1)

## 2016-05-27 LAB — CBC WITH DIFFERENTIAL/PLATELET
BASOS ABS: 0.1 10*3/uL (ref 0.0–0.1)
Basophils Relative: 1 %
EOS ABS: 0.1 10*3/uL (ref 0.0–0.7)
EOS PCT: 3 %
HCT: 33.8 % — ABNORMAL LOW (ref 39.0–52.0)
HEMOGLOBIN: 10.7 g/dL — AB (ref 13.0–17.0)
LYMPHS ABS: 0.7 10*3/uL (ref 0.7–4.0)
LYMPHS PCT: 15 %
MCH: 27.7 pg (ref 26.0–34.0)
MCHC: 31.7 g/dL (ref 30.0–36.0)
MCV: 87.6 fL (ref 78.0–100.0)
Monocytes Absolute: 0.4 10*3/uL (ref 0.1–1.0)
Monocytes Relative: 9 %
NEUTROS PCT: 72 %
Neutro Abs: 3.6 10*3/uL (ref 1.7–7.7)
PLATELETS: 250 10*3/uL (ref 150–400)
RBC: 3.86 MIL/uL — ABNORMAL LOW (ref 4.22–5.81)
RDW: 18.5 % — ABNORMAL HIGH (ref 11.5–15.5)
WBC: 4.9 10*3/uL (ref 4.0–10.5)

## 2016-05-27 LAB — SAMPLE TO BLOOD BANK

## 2016-05-27 LAB — FERRITIN: Ferritin: 303 ng/mL (ref 24–336)

## 2016-05-27 NOTE — Progress Notes (Signed)
Pts urinalysis came back normal this am.  Spoke with Dr Whitney Muse, we are going to bring him in for lab work today.  CBC, CMP, and ferritin.  If these are normal then we will given him an antibiotic for UTI, if not then we will handle his blood counts accordingly.  He verbalized understanding.

## 2016-05-29 ENCOUNTER — Other Ambulatory Visit (HOSPITAL_COMMUNITY): Payer: Self-pay | Admitting: Emergency Medicine

## 2016-05-29 MED ORDER — CIPROFLOXACIN HCL 250 MG PO TABS: 250.0000 mg | ORAL_TABLET | Freq: Two times a day (BID) | ORAL | 0 refills | Status: DC

## 2016-05-29 NOTE — Progress Notes (Signed)
  Blood work looked good, ferritin level good.  Called in antibiotic cipro to his pharmacy.

## 2016-06-04 ENCOUNTER — Encounter: Payer: Self-pay | Admitting: Dietician

## 2016-06-04 ENCOUNTER — Encounter (HOSPITAL_BASED_OUTPATIENT_CLINIC_OR_DEPARTMENT_OTHER): Payer: BLUE CROSS/BLUE SHIELD | Admitting: Hematology & Oncology

## 2016-06-04 ENCOUNTER — Encounter (HOSPITAL_COMMUNITY): Payer: Self-pay | Admitting: Hematology & Oncology

## 2016-06-04 VITALS — BP 149/91 | HR 82 | Temp 98.2°F | Resp 18 | Wt 142.5 lb

## 2016-06-04 DIAGNOSIS — R634 Abnormal weight loss: Secondary | ICD-10-CM

## 2016-06-04 DIAGNOSIS — C090 Malignant neoplasm of tonsillar fossa: Secondary | ICD-10-CM

## 2016-06-04 DIAGNOSIS — F17200 Nicotine dependence, unspecified, uncomplicated: Secondary | ICD-10-CM

## 2016-06-04 DIAGNOSIS — D61810 Antineoplastic chemotherapy induced pancytopenia: Secondary | ICD-10-CM

## 2016-06-04 DIAGNOSIS — K117 Disturbances of salivary secretion: Secondary | ICD-10-CM

## 2016-06-04 DIAGNOSIS — Z720 Tobacco use: Secondary | ICD-10-CM

## 2016-06-04 DIAGNOSIS — K631 Perforation of intestine (nontraumatic): Secondary | ICD-10-CM

## 2016-06-04 NOTE — Progress Notes (Signed)
Follow up with H&N patient who is s/p chemoradiation and under going rehabilitation   Contacted Pt by visiting atoffice visit    Wt Readings from Last 10 Encounters:  05/04/16 140 lb 3.2 oz (63.6 kg)  04/14/16 137 lb (62.1 kg)  03/28/16 150 lb (68 kg)  02/27/16 152 lb 1.6 oz (69 kg)  02/03/16 153 lb 1.6 oz (69.4 kg)  01/15/16 150 lb 12.8 oz (68.4 kg)  12/27/15 153 lb 11.2 oz (69.7 kg)  12/17/15 155 lb 6.4 oz (70.5 kg)  11/28/15 154 lb 12.8 oz (70.2 kg)  11/05/15 162 lb 3.2 oz (73.6 kg)   Patient weight today was 142.5 lbs, indicating a gain of 2-3 lbs in the last month.   Patient reports oral intake as fair and unchanged since last visit. He is still suffering from soreness/pain with chewing, more so after he eats a few consecutive meals. He describes the pain as "stabbing" in nature and it radiates up his jaw to his ear. As a result, the majority of his intake is liquids, smoothies, shakes, yogurt, supplements and other foods that dont require chewing.  He believes he is producing more saliva and also continues to regain taste.    He has been drinking 2-4 Boost supplements per day and 2-4 cans of Jevity 1.2 per day. He says he has more or less been forcing himself to do the 2 cans of Jevity because he was instructed to do so.   MD was present at visit and said she would refer to local ENT to have EGD performed given ongoing swallowing pain. Until he is evaluated, he will continue with the Jevity tube feeding. MD placed on B complex.   He reports his wound from his colectomy has healed quickly and he only has a scab now. This further indicates he has sufficient PO intake.   He has not had trouble with his colostomy. He has become fairly comfortable with it. He says the GI MD has talked about performing a reversal in the near future.    He is given coupons for Boost. Will mail Ensure coupons when obtained.   Will follow patient remotely for results of EGD and, pending the results,  can discuss eliminating the tube feeding at that time  [name] RD, LDN, [place] Nutrition Pager: [phone] 06/04/2016 1:30 PM

## 2016-06-04 NOTE — Patient Instructions (Signed)
[place] at [facility] Discharge Instructions  RECOMMENDATIONS MADE BY THE CONSULTANT AND ANY TEST RESULTS WILL BE SENT TO YOUR REFERRING PHYSICIAN.  You were seen today by Dr. Whitney Muse. She is referring you to Dr. Gala Romney for c-scope and EGD. Return in 3 weeks for follow up and labs.   Thank you for choosing [place] at [facility] to provide your oncology and hematology care.  To afford each patient quality time with our provider, please arrive at least 15 minutes before your scheduled appointment time.    If you have a lab appointment with the [facility] please come in thru the  Main Entrance and check in at the main information desk  You need to re-schedule your appointment should you arrive 10 or more minutes late.  We strive to give you quality time with our providers, and arriving late affects you and other patients whose appointments are after yours.  Also, if you no show three or more times for appointments you may be dismissed from the clinic at the providers discretion.     Again, thank you for choosing [facility].  Our hope is that these requests will decrease the amount of time that you wait before being seen by our physicians.       _____________________________________________________________  Should you have questions after your visit to [facility], please contact our office at [phone] between the hours of 8:30 a.m. and 4:30 p.m.  Voicemails left after 4:30 p.m. will not be returned until the following business day.  For prescription refill requests, have your pharmacy contact our office.       Resources For Cancer Patients and their Caregivers ? American Cancer Society: Can assist with transportation, wigs, general needs, runs Look Good Feel Better.        ([phone] 427-4358 ? Cancer Care: Provides financial assistance, online support groups, medication/co-pay assistance.  1-800-813-HOPE  [phone]) ? [place] Assists [place] cancer patients and their families through emotional , educational and financial support.  (680)348-2959 ? [place] DSS Where to apply for food stamps, Medicaid and utility assistance. (380) 135-1457 ? RCATS: Transportation to medical appointments. 636-191-4008 ? Social Security Administration: May apply for disability if have a Stage IV cancer. 340-596-3422 (223)116-5407 ? [employer], Disability and Transit Services: Assists with nutrition, care and transit needs. [place] Support Programs: @10RELATIVEDAYS @ > Cancer Support Group  2nd Tuesday of the month 1pm-2pm, Journey Room  > Creative Journey  3rd Tuesday of the month 1130am-1pm, Journey Room  > Look Good Feel Better  1st Wednesday of the month 10am-12 noon, Journey Room (Call [place] to register [phone])

## 2016-06-05 ENCOUNTER — Encounter: Payer: Self-pay | Admitting: Internal Medicine

## 2016-06-15 ENCOUNTER — Telehealth (HOSPITAL_COMMUNITY): Payer: Self-pay | Admitting: Emergency Medicine

## 2016-06-15 NOTE — Telephone Encounter (Signed)
Pt called and wanted to know why is hasnt heard anything about his GI referral.  He has an appt scheduled for 06/29/2016 at 9:00 am.  If they have not called him yet its bc they are still authorizing everything and verifying appt with the doctor.  He does have an appt for labs when he sees the doctor on 07/02/2016 it was just scheduled after his appt.

## 2016-06-17 ENCOUNTER — Telehealth (HOSPITAL_COMMUNITY): Payer: Self-pay

## 2016-06-17 DIAGNOSIS — K7030 Alcoholic cirrhosis of liver without ascites: Secondary | ICD-10-CM

## 2016-06-17 DIAGNOSIS — R112 Nausea with vomiting, unspecified: Secondary | ICD-10-CM

## 2016-06-17 NOTE — Telephone Encounter (Signed)
Patient called stating that he spoke with Manuela Schwartz at Dr. Roseanne Kaufman office. He states they received a recommendation for his endoscopy and colonoscopy from [place] [facility] and wanted to know if Dr. Whitney Muse needed to send a recommendation for insurance purposes. I explained to patient I would check with MD.  Also he c/o edema around his surgical site. It is not painful but does "worry" him.  Reviewed with PA-C. [place] of abdomen ordered to check for cirrhosis. Patient notified and verbalized understanding. Message sent to scheduler also.

## 2016-06-20 ENCOUNTER — Encounter (HOSPITAL_COMMUNITY): Payer: Self-pay | Admitting: Hematology & Oncology

## 2016-06-25 ENCOUNTER — Encounter (HOSPITAL_COMMUNITY): Payer: Self-pay | Admitting: Hematology & Oncology

## 2016-06-25 ENCOUNTER — Ambulatory Visit (HOSPITAL_COMMUNITY)
Admission: RE | Admit: 2016-06-25 | Discharge: 2016-06-25 | Disposition: A | Discharge status: Home / Self Care | Payer: BLUE CROSS/BLUE SHIELD | Source: Ambulatory Visit | Attending: Oncology | Admitting: Oncology

## 2016-06-25 DIAGNOSIS — R932 Abnormal findings on diagnostic imaging of liver and biliary tract: Secondary | ICD-10-CM | POA: Insufficient documentation

## 2016-06-25 DIAGNOSIS — K8020 Calculus of gallbladder without cholecystitis without obstruction: Secondary | ICD-10-CM | POA: Diagnosis not present

## 2016-06-25 DIAGNOSIS — R112 Nausea with vomiting, unspecified: Secondary | ICD-10-CM | POA: Diagnosis not present

## 2016-06-25 DIAGNOSIS — K7030 Alcoholic cirrhosis of liver without ascites: Secondary | ICD-10-CM

## 2016-06-25 NOTE — Progress Notes (Signed)
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 He is seen at [facility], Hepatitis C negative CT neck with contrast 06/25/2015 at [place] level 2 malignant LAD, largest node is 3.5 cm long axis. R worse than L carotid atherosclerosis and stenosis. CT chest with no acute or significant findings within the chest    Cancer of tonsillar fossa ([place])   08/02/2015 Initial Diagnosis    Cancer of tonsillar fossa ([place])      08/20/2015 Procedure    Port-a-cath placed by IR      08/20/2015 Procedure    G-tube placed by IR      08/21/2015 - 10/09/2015 Radiation Therapy    70 Gy, Dr. Isidore Castillo.      08/21/2015 - 09/10/2015 Chemotherapy    Cisplatin every 21 days with XRT.      08/28/2015 Adverse Reaction    Tinnitis, Cisplatin-induced.      09/11/2015 - 09/30/2015 Chemotherapy    Carboplatin/5FU      09/20/2015 - 09/21/2015 Hospital Admission    Intractable nausea/vomiting. Severe Mucositis      10/17/2015 - 10/20/2015 Hospital Admission    Esophageal/oral candidasis. Shingles. Fever. Chemotherapy induced pancytopenia      01/24/2016 PET scan    Resolution of the left tonsillar mass and left neck adenopathy. No findings for residual or locally recurrent tumor, adenopathy or distant metastatic disease.      03/28/2016 - 04/08/2016 Hospital Admission    Admitted with severe abdominal pain. Found to diffuse peritonitis from perforated sigmoid colon requiring colostomy. Discharged to [facility] in [place].           HISTORY OF PRESENTING ILLNESS:    Frank Castillo 66 y.o. 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 Treatment course complicated by severe chemoradiation-induced stomatitis.  Frank Castillo is unaccompanied.He has also undergone an exploratory laparotomy, sigmoid colectomy, and colostomy placement on 38/05/173 with Dr. Leighton Castillo. He is recovering well. He is back at home.  Unfortunately he is smoking again. He plans on quitting and will work with Frank Castillo on this, he has had a lot of anxiety with his health last year.  Weight continues to be an issue. He is working with nutrition. He is still using his PEG to a degree. He needs a C-scope. I am also referring him for an EGD given his ongoing throat pain.  He stopped his sialagen secondary to profuse sweating. He believes that his mouth is not as dry as it used to be.  MEDICAL HISTORY:  Past Medical History:  Diagnosis Date  . Cirrhosis ([place])   . Hypertension   . Iron deficiency anemia 05/07/2016  . Portal hypertension ([place])   . Tonsillar mass    left    SURGICAL HISTORY: Past Surgical History:  Procedure Laterality Date  . HERNIA REPAIR Right   . LAPAROTOMY N/A 03/28/2016   Procedure: EXPLORATORY LAPAROTOMY, SIGMOID COLECTOMY, COLOSTOMY;  Surgeon: Frank Ruff,  MD;  Location: [facility];  Service: General;  Laterality: N/A;  . MOUTH SURGERY    . TONSILLECTOMY Left 07/09/2015   Procedure: LEFT TONSILLECTOMY;  Surgeon: Leta Baptist, MD;  Location: [place];  Service: ENT;  Laterality: Left;    SOCIAL HISTORY: Social History   Social History  . Marital status: Single    Spouse name: N/A  . Number of children: 0  . Years of education: N/A   Occupational History  . Not on file.    Social History Main Topics  . Smoking status: Current Every Day Smoker    Packs/day: 0.50    Types: Cigarettes  . Smokeless tobacco: Never Used  . Alcohol use No     Comment: former abuse- quit 09/2011  . Drug use: No  . Sexual activity: Not on file   Other Topics Concern  . Not on file   Social History Narrative   Divorced.   No children.   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   Currently smoking approximately one pack per day.   Has been smoking off/on since age 47.   Divorced No children. Just step-children. 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 Previously worked in [employer]. Originally from [place], [place]. Current smoker at 1 ppd. Began smoking at 14-15 yo, regularly smoking at 4-19 yo. During his "cocaine years" he smoked about 2 ppd. ETOH, recovering alcoholic. 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 He enjoys reading. 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  FAMILY HISTORY: Family History  Problem Relation Age of Onset  . Macular degeneration Mother   . Alcoholism Father    indicated that his mother is alive. He indicated that his father is deceased.   Mother still living at 64 years-old, with macular degeneration. She has no other health issues. Father deceased. Parents divorced at 80 years-old, so he is not sure what he died from. Father was an alcoholic. 4 sisters. 1 sister died of suicide. 1 sister was adopted. 1 Sister lives in [place]. 1 Sister lives in [place].  ALLERGIES:  is allergic to bee venom and other.  MEDICATIONS:  Current Outpatient Prescriptions  Medication Sig Dispense Refill  . cetirizine (ZYRTEC) 10 MG tablet Take 10 mg by mouth daily.    . ciprofloxacin (CIPRO) 250 MG tablet Take 1 tablet (250 mg total) by mouth 2 (two) times daily. 10 tablet 0  . escitalopram (LEXAPRO) 20 MG tablet Take 1/2 tablet for 1 week then increase to 1 tablet daily. 30 tablet 3  . fentaNYL (DURAGESIC - DOSED MCG/HR) 12 MCG/HR Place 1  patch (12.5 mcg total) onto the skin every 3 (three) days. 10 patch 0  . LORazepam (ATIVAN) 0.5 MG tablet Take 1 tablet (0.5 mg total) by mouth every 6 (six) hours as needed for anxiety. 45 tablet 0  . ondansetron (ZOFRAN ODT) 8 MG disintegrating tablet Take 1 tablet (8 mg total) by mouth every 8 (eight) hours as needed for nausea or vomiting. 30 tablet 2  . oxyCODONE-acetaminophen (PERCOCET/ROXICET) 5-325 MG tablet Take 1 tablet by mouth every 4 (four) hours as needed for severe pain. This is a one month supply 60 tablet 0  . sodium fluoride (FLUORISHIELD) 1.1 % GEL dental gel Instill one drop of gel per tooth space of fluoride tray. Place over teeth for 5 minutes. Remove. Spit out excess. Repeat nightly. 125 mL 2   No current facility-administered medications for this visit.    Facility-Administered Medications Ordered in  Other Visits  Medication Dose Route Frequency Provider Last Rate Last Dose  . 0.9 %  sodium chloride infusion   Intravenous Continuous Manon Hilding Kefalas, PA-C      . 0.9 %  sodium chloride infusion   Intravenous Continuous Thomas S Kefalas, PA-C      . 0.9 %  sodium chloride infusion   Intravenous Continuous Kellon Chalk K Romelo Sciandra, MD      . 0.9 %  sodium chloride infusion   Intravenous Continuous Kelby Fam Alencia Gordon, MD      . 0.9 %  sodium chloride infusion   Intravenous Continuous Frank Ranks, MD        Review of Systems  Constitutional: Negative for fever, chills.  HENT: Positive for sore throat. Negative for congestion, hearing loss and nosebleeds.  Dry mouth Eyes: Negative.  Negative for blurred vision, double vision, pain and discharge.  Respiratory: Negative for cough, hemoptysis, shortness of breath and wheezing.   Cardiovascular: Negative.  Negative for chest pain, palpitations, claudication, leg swelling and PND.  Gastrointestinal: Negative for heartburn, vomiting, abdominal pain, blood in stool and melena.  Genitourinary: Negative.  Negative for dysuria,  urgency, frequency and hematuria.  Musculoskeletal: Negative.  Negative for myalgias, joint pain and falls.  Skin: Negative Neurological: Negative.  Negative for dizziness, tingling, tremors, sensory change, speech change, focal weakness, seizures, loss of consciousness, weakness and headaches.  Endo/Heme/Allergies: Negative.  Does not bruise/bleed easily.  Psychiatric/Behavioral: Positive for insomnia.  Negative for depression, suicidal ideas, memory loss and substance abuse. The patient is not nervous/anxious. All other systems reviewed and are negative. 14 point ROS was done and is otherwise as detailed above or in HPI   PHYSICAL EXAMINATION: ECOG PERFORMANCE STATUS: 1 - Symptomatic but completely ambulatory   Vitals with BMI 02/27/2016  Height   Weight 152 lbs 2 oz  BMI   Systolic 378  Diastolic 79  Pulse 81  Respirations 20    Physical Exam  Constitutional: He is oriented to person, place, and time  and in no distress.  HENT: WNL Head: Normocephalic and atraumatic.  Nose: Nose normal.  Mouth/Throat: No mucositis. Oropharynx clear. Tongue with normal ROM Eyes: Conjunctivae and EOM are normal. Pupils are equal, round, and reactive to light. Right eye exhibits no discharge. Left eye exhibits no discharge. No scleral icterus.  Neck: Normal range of motion. Neck supple. No tracheal deviation present. No thyromegaly present. Lymphedema noted.  Cardiovascular: Normal rate, regular rhythm and normal heart sounds.  Exam reveals no gallop and no friction rub.  Port site is C/D/I no erythema or bruising No murmur heard.  Pulmonary/Chest: Effort normal and breath sounds normal. He has no wheezes. He has no rales.  Abdominal: Soft. Bowel sounds are normal. He exhibits no distension and no mass. There is no tenderness. There is no rebound and no guarding. FT site is C/D/I, no erythema ostomy intact. Musculoskeletal: Normal range of motion. He exhibits no edema.  Neurological: He is alert  and oriented to person, place, and time. He has normal reflexes. No cranial nerve deficit. Gait normal. Coordination normal.  Skin: Skin is warm and dry.  Psychiatric: Mood, memory, affect and judgment normal.  Nursing note and vitals reviewed.   LABORATORY DATA:  I have reviewed the data as listed below.   Ref. Range 05/27/2016 14:34  Sodium Latest Ref Range: 135 - 145 mmol/L 130 (L)  Potassium Latest Ref Range: 3.5 - 5.1 mmol/L 3.9  Chloride Latest Ref Range: 101 - 111 mmol/L  96 (L)  CO2 Latest Ref Range: 22 - 32 mmol/L 29  Glucose Latest Ref Range: 65 - 99 mg/dL 69  BUN Latest Ref Range: 6 - 20 mg/dL 12  Creatinine Latest Ref Range: 0.61 - 1.24 mg/dL 0.61  Calcium Latest Ref Range: 8.9 - 10.3 mg/dL 8.9  Anion gap Latest Ref Range: 5 - 15  5  Alkaline Phosphatase Latest Ref Range: 38 - 126 U/L 43  Albumin Latest Ref Range: 3.5 - 5.0 g/dL 3.8  AST Latest Ref Range: 15 - 41 U/L 13 (L)  ALT Latest Ref Range: 17 - 63 U/L 9 (L)  Total Protein Latest Ref Range: 6.5 - 8.1 g/dL 6.9  Total Bilirubin Latest Ref Range: 0.3 - 1.2 mg/dL 0.2 (L)  EGFR (African American) Latest Ref Range: >60 mL/min >60  EGFR (Non-African Amer.) Latest Ref Range: >60 mL/min >60  Ferritin Latest Ref Range: 24 - 336 ng/mL 303  WBC Latest Ref Range: 4.0 - 10.5 K/uL 4.9  RBC Latest Ref Range: 4.22 - 5.81 MIL/uL 3.86 (L)  Hemoglobin Latest Ref Range: 13.0 - 17.0 g/dL 10.7 (L)  HCT Latest Ref Range: 39.0 - 52.0 % 33.8 (L)  MCV Latest Ref Range: 78.0 - 100.0 fL 87.6  MCH Latest Ref Range: 26.0 - 34.0 pg 27.7  MCHC Latest Ref Range: 30.0 - 36.0 g/dL 31.7  RDW Latest Ref Range: 11.5 - 15.5 % 18.5 (H)  Platelets Latest Ref Range: 150 - 400 K/uL 250  Neutrophils Latest Units: % 72  Lymphocytes Latest Units: % 15  Monocytes Relative Latest Units: % 9  Eosinophil Latest Units: % 3  Basophil Latest Units: % 1  NEUT# Latest Ref Range: 1.7 - 7.7 K/uL 3.6  Lymphocyte # Latest Ref Range: 0.7 - 4.0 K/uL 0.7  Monocyte #  Latest Ref Range: 0.1 - 1.0 K/uL 0.4  Eosinophils Absolute Latest Ref Range: 0.0 - 0.7 K/uL 0.1  Basophils Absolute Latest Ref Range: 0.0 - 0.1 K/uL 0.1   RADIOGRAPHIC STUDIES: I have personally reviewed the radiological reports as listed.  Study Result   CLINICAL DATA:  Subsequent treatment strategy for tonsillar carcinoma.  EXAM: NUCLEAR MEDICINE PET SKULL BASE TO THIGH  TECHNIQUE: Seven point for mCi F-18 FDG was injected intravenously. Full-ring PET imaging was performed from the skull base to thigh after the radiotracer. CT data was obtained and used for attenuation correction and anatomic localization.  FASTING BLOOD GLUCOSE:  Value: 103 mg/dl  COMPARISON:  PET-CT 07/30/2015 and prior neck CT 06/25/2015  FINDINGS: NECK  No areas of hypermetabolism identified in the left tonsillar area. The enlarged and hypermetabolic left-sided level 2 lymph node has resolved. No enlarged or hypermetabolic neck nodes. No supraclavicular adenopathy.  CHEST  No hypermetabolic mediastinal or hilar nodes. No suspicious pulmonary nodules on the CT scan.  ABDOMEN/PELVIS  No abnormal hypermetabolic activity within the liver, pancreas, adrenal glands, or spleen. No hypermetabolic lymph nodes in the abdomen or pelvis.  A gastric feeding tube is noted. Stable atherosclerotic calcifications involving the aorta and branch vessels.  SKELETON  No focal hypermetabolic activity to suggest skeletal metastasis.  IMPRESSION: Resolution of the left tonsillar mass and left neck adenopathy. No findings for residual or locally recurrent tumor, adenopathy or distant metastatic disease.   Electronically Signed   By: Marijo Sanes M.D.   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 He is  seen at [facility], Hepatitis C negative CT neck with  contrast 06/25/2015 at [place] level 2 malignant LAD, largest node is 3.5 cm long axis. R worse than L carotid atherosclerosis and stenosis. CT chest with no acute or significant findings within the chest [facility] stay secondary to intractable n/v and mucositis Oropharyngeal candidiasis/esophageal candidiasis Odynophagia Fever Shingles Chemotherapy induced pancytopenia   Cancer of tonsillar fossa ([facility]) Stage IVA squamous cell carcinoma of left tonsil (T2N2BM0) having completed concurrent therapy.  Concomitant chemoradiation began on 08/20/2015 consisting of Cisplatin every 21 days complicated by Cisplatin-induced tinnitus following cycle 1 of treatment leading to a change in therapy to Carboplatin/5FU.  Treatment course complicated by severe chemoradiation-induced stomatitis.  Post treatment PET/CT was excellent. Patient continues to follow with radiation oncology.    Shingles rash Resolved. Occasional itchiness, no significant post-herpetic neuralgia.   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 He has discontinued sialagen secondary to profuse sweating.  Tobacco use disorder Stop smoking date was on 9/19. He continues to struggle with this. Will work on it moving forward.   I have referred him to GI for C-scope and EGD. Dr. Marcello Moores is requesting C-scope. I think he should proceed with EGD given his ongoing complaints of pain with swallowing. He will continue to work with nutrition in regards to his weight and nutritional statis.   I will see [place] back in the next 2-3 weeks. His recovery is better with ongoing close visits. He knows to call in the interim if there is anything he needs.  All questions were answered. The patient knows to call the clinic with any problems, questions or concerns.  This document serves as a record of services personally performed by Ancil Linsey, MD. It was created on her behalf by Elmyra Ricks, a trained medical scribe. The  creation of this record is based on the scribe's personal observations and the provider's statements to them. This document has been checked and approved by the attending provider.  I have reviewed the above documentation for accuracy and completeness, and I agree with the above.  This note was electronically signed.    Molli Hazard, MD  06/25/2016 8:23 AM

## 2016-06-29 ENCOUNTER — Ambulatory Visit (INDEPENDENT_AMBULATORY_CARE_PROVIDER_SITE_OTHER): Payer: BLUE CROSS/BLUE SHIELD | Admitting: Gastroenterology

## 2016-06-29 ENCOUNTER — Other Ambulatory Visit: Payer: Self-pay

## 2016-06-29 ENCOUNTER — Encounter: Payer: Self-pay | Admitting: Gastroenterology

## 2016-06-29 VITALS — BP 131/81 | HR 90 | Temp 98.1°F | Ht 70.0 in | Wt 150.4 lb

## 2016-06-29 DIAGNOSIS — Z1211 Encounter for screening for malignant neoplasm of colon: Secondary | ICD-10-CM | POA: Diagnosis not present

## 2016-06-29 DIAGNOSIS — K7030 Alcoholic cirrhosis of liver without ascites: Secondary | ICD-10-CM | POA: Diagnosis not present

## 2016-06-29 DIAGNOSIS — IMO0002 Reserved for concepts with insufficient information to code with codable children: Secondary | ICD-10-CM | POA: Insufficient documentation

## 2016-06-29 DIAGNOSIS — R1310 Dysphagia, unspecified: Secondary | ICD-10-CM

## 2016-06-29 DIAGNOSIS — C090 Malignant neoplasm of tonsillar fossa: Secondary | ICD-10-CM

## 2016-06-29 DIAGNOSIS — Z85818 Personal history of malignant neoplasm of other sites of lip, oral cavity, and pharynx: Secondary | ICD-10-CM

## 2016-06-29 DIAGNOSIS — Z9889 Other specified postprocedural states: Secondary | ICD-10-CM

## 2016-06-29 DIAGNOSIS — R1312 Dysphagia, oropharyngeal phase: Secondary | ICD-10-CM

## 2016-06-29 MED ORDER — PEG 3350-KCL-NA BICARB-NACL 420 G PO SOLR: 4000.0000 mL | ORAL | 0 refills | Status: DC

## 2016-06-29 NOTE — Patient Instructions (Signed)
1. Colonoscopy and upper endoscopy with Dr. Gala Romney. Please see separate instructions. 2. You do not have to use the enema.

## 2016-06-29 NOTE — Assessment & Plan Note (Signed)
Patient continues to have painful swallowing but denies food sticking in the esophagus. Has had issues with pills sticking but this is improved further out from radiation treatments. He has a history of oral candidiasis related to previous chemotherapy/XRT. Continues to use G-tube quite a bit because of odynophagia. Dr. Whitney Muse requests upper endoscopy which is reasonable. Possible esophageal dilation based on findings. If upper endoscopy is unremarkable, he may need ENT evaluation. EGD+/-ED with deep sedation in the or with Dr. Gala Romney.  I have discussed the risks, alternatives, benefits with regards to but not limited to the risk of reaction to medication, bleeding, infection, perforation and the patient is agreeable to proceed. Written consent to be obtained.

## 2016-06-29 NOTE — Progress Notes (Signed)
Primary Care Physician:  Celedonio Savage, MD  Primary Gastroenterologist:  Garfield Cornea, MD Referring providers: Dr. Ancil Linsey ([place]) and Dr. Leighton Ruff  Chief Complaint  Patient presents with  . Colonoscopy    consult for TCS before colostomy is reversed  . EGD    consult d/t sore throat from radiation    HPI:  Frank Castillo is a 66 y.o. 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  Patient was diagnosed with cancer of the tonsillar fossa back in March 2017. He had a G-tube placed via interventional radiology which he continues to use. He received radiation therapy from March 29 and completed 10/09/2015. Received adjuvant chemotherapy during this time. He was hospitalized in April 2017 with severe mucositis. Hospitalized in May with esophageal/oral candidiasis, shingles, pancytopenia. In September PET scan showed resolution of left tonsillar mass and left neck adenopathy. No evidence of residual or locally recurrent tumor, adenopathy, distant metastatic disease. In November he was admitted with severe abdominal pain. Found to have diffuse peritonitis from perforated sigmoid colon requiring urgent colostomy with Hartman's procedure. As was done by Dr. Leighton Ruff. He has never had a colonoscopy. Dr. Marcello Moores is requesting colonoscopy through his stoma as well as endoscopic evaluation of the rectal pouch prior to consideration of reversal of his colostomy.  Dr. Whitney Muse is also requesting upper endoscopy because patient has ongoing throat pain. Slightly improved but still significant discomfort. He complains of pain with swallowing and therefore continues to use his G-tube quite a bit. He does consume 3 small meals daily orally. Uses his Jevity to supplement his diet. Has improved with swallowing pills. Complains of  painful swallowing. He has a stabbing pain which started after his radiation on the left neck and persist. Had been on opioids but now off for a while. Trying to wean off of ibuprofen. He denies heartburn. Denies abdominal pain. Most of his stools are loose. No blood per rectum or melena. Continues to have decreased saliva production. Patient has been followed by speech therapy as well as nutrition.  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 Last visit October 2017. Has to continue follow-up with [facility] for cirrhosis.  EGD January 2017 at [facility], normal esophagus, portal hypertensive gastropathy found.    Abdominal ultrasound done locally last week showed gallstones, cirrhosis but no hepatoma.  CT abdomen and pelvis November 2017 showed gallstones, cirrhosis, free intraperitoneal air and fluid, thickened small bowel loops with hyperemia in the left upper quadrant, diffuse colonic diverticulosis, focal extraluminal gas collection adjacent to the urinary bladder favoring colonic perforation.  Current Outpatient Prescriptions  Medication Sig Dispense Refill  . cetirizine (ZYRTEC) 10 MG tablet Take 10 mg by mouth daily.    . Cholecalciferol (VITAMIN D PO) Take 1 tablet by mouth daily. Pt unsure of dose    . Cyanocobalamin (VITAMIN B12 PO) Take 1 tablet by mouth daily. Pt unsure of dose    . escitalopram (LEXAPRO) 20 MG tablet Take 1/2 tablet for 1 week then increase to 1 tablet daily. (Patient taking differently: Take 20 mg by mouth daily. ) 30 tablet 3  . sodium fluoride (FLUORISHIELD) 1.1 % GEL dental gel Instill one drop of gel per tooth space of fluoride tray. Place over teeth for 5 minutes. Remove.  Spit out excess. Repeat nightly. 125 mL 2  . Vitamins-Lipotropics (LIPOFLAVONOID) TABS Take 2 tablets by mouth 3 (three) times daily.     No current facility-administered medications for this visit.     Facility-Administered Medications Ordered in Other Visits  Medication Dose Route Frequency Provider Last Rate Last Dose  . 0.9 %  sodium chloride infusion   Intravenous Continuous Manon Hilding Kefalas, PA-C      . 0.9 %  sodium chloride infusion   Intravenous Continuous Manon Hilding Kefalas, PA-C      . 0.9 %  sodium chloride infusion   Intravenous Continuous Kelby Fam Penland, MD      . 0.9 %  sodium chloride infusion   Intravenous Continuous Patrici Ranks, MD      . 0.9 %  sodium chloride infusion   Intravenous Continuous Patrici Ranks, MD        Allergies as of 06/29/2016 - Review Complete 06/29/2016  Allergen Reaction Noted  . Bee venom Swelling 07/31/2015  . Other  11/28/2015    Past Medical History:  Diagnosis Date  . Cirrhosis ([place])    Followed at [facility]  . Hypertension   . Iron deficiency anemia 05/07/2016  . Portal hypertension ([place])   . Tonsillar mass    left    Past Surgical History:  Procedure Laterality Date  . HERNIA REPAIR Right   . LAPAROTOMY N/A 03/28/2016   Procedure: EXPLORATORY LAPAROTOMY, SIGMOID COLECTOMY, COLOSTOMY;  Surgeon: Leighton Ruff, MD;  Location: [facility];  Service: General;  Laterality: N/A;  . MOUTH SURGERY    . TONSILLECTOMY Left 07/09/2015   Procedure: LEFT TONSILLECTOMY;  Surgeon: Leta Baptist, MD;  Location: [facility];  Service: ENT;  Laterality: Left;    Family History  Problem Relation Age of Onset  . Macular degeneration Mother   . Alcoholism Father     Social History   Social History  . Marital status: Single    Spouse name: N/A  . Number of children: 0  . Years of education: N/A   Occupational History  . Not on file.   Social History Main Topics  . Smoking status: Current Every Day Smoker    Packs/day: 0.50    Types: Cigarettes  . Smokeless tobacco: Never Used  . Alcohol use No     Comment: former abuse- quit 09/2011  . Drug use: No  . Sexual activity: Not on file   Other Topics Concern  . Not on file    Social History Narrative   Divorced.   No children.   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   Currently smoking approximately one pack per day.   Has been smoking off/on since age 27.      ROS:  General: Negative for anorexia, weight loss, fever, chills, fatigue, weakness.Patient weighed 180 pounds prior to cancer diagnosis March 2017. He was down to 137 pounds in November. Back up to 150 pounds today. Eyes: Negative for vision changes.  ENT: Negative for hoarseness,  nasal congestion. See history of present illness CV: Negative for chest pain, angina, palpitations, dyspnea on exertion, peripheral edema.  Respiratory: Negative for dyspnea at rest, dyspnea on exertion, cough, sputum, wheezing.  GI: See history of present illness. GU:  Negative for dysuria, hematuria, urinary incontinence, urinary frequency, nocturnal urination.  MS: Negative for joint pain, low back pain.  Derm: Negative for rash or itching.  Neuro: Negative for weakness, abnormal sensation, seizure, frequent headaches, memory  loss, confusion.  Psych: Negative for anxiety, depression, suicidal ideation, hallucinations.  Endo: Negative for unusual weight change.  Heme: Negative for bruising or bleeding. Allergy: Negative for rash or hives.    Physical Examination:  BP 131/81   Pulse 90   Temp 98.1 F (36.7 C) (Oral)   Ht 5\' 10"  (1.778 m)   Wt 150 lb 6.4 oz (68.2 kg)   BMI 21.58 kg/m    General: Well-nourished, well-developed in no acute distress.  Head: Normocephalic, atraumatic.   Eyes: Conjunctiva pink, no icterus. Mouth: Oropharyngeal mucosa moist and pink , no lesions erythema or exudate. Neck: Supple without thyromegaly, masses, or lymphadenopathy.  Lungs: Clear to auscultation bilaterally.  Heart: Regular rate and rhythm, no murmurs rubs or gallops.  Abdomen: Bowel sounds are normal, nontender, nondistended, no hepatosplenomegaly or masses, no abdominal bruits or    hernia , no  rebound or guarding.  Ostomy bag located left lower abdomen brown liquid stool present. G-tube located upper abdomen. Rectal: Deferred to time of colonoscopy Extremities: No lower extremity edema. No clubbing or deformities.  Neuro: Alert and oriented x 4 , grossly normal neurologically.  Skin: Warm and dry, no rash or jaundice.   Psych: Alert and cooperative, normal mood and affect.  Labs: Lab Results  Component Value Date   WBC 4.9 05/27/2016   HGB 10.7 (L) 05/27/2016   HCT 33.8 (L) 05/27/2016   MCV 87.6 05/27/2016   PLT 250 05/27/2016   Lab Results  Component Value Date   CREATININE 0.61 05/27/2016   BUN 12 05/27/2016   NA 130 (L) 05/27/2016   K 3.9 05/27/2016   CL 96 (L) 05/27/2016   CO2 29 05/27/2016   Lab Results  Component Value Date   ALT 9 (L) 05/27/2016   AST 13 (L) 05/27/2016   ALKPHOS 43 05/27/2016   BILITOT 0.2 (L) 05/27/2016   Lab Results  Component Value Date   IRON 15 (L) 05/06/2016   TIBC 235 (L) 05/06/2016   FERRITIN 303 05/27/2016   Lab Results  Component Value Date   [id number] 05/06/2016   Lab Results  Component Value Date   INR 0.95 03/28/2016   INR 1.05 08/20/2015     Imaging Studies: US Abdomen Complete  Result Date: 06/25/2016 CLINICAL DATA:  Cirrhosis. EXAM: ABDOMEN ULTRASOUND COMPLETE COMPARISON:  CT 03/28/2016 . FINDINGS: Gallbladder: Gallstones. Gallbladder wall thickness normal at 1.7 mm. Negative Murphy sign. Common bile duct: Diameter: 4.5 mm Liver: Liver has a heterogeneous parenchymal pattern with nodular contour consistent with history of cirrhosis. No focal hepatic abnormality identified. IVC: No abnormality visualized. Pancreas: Visualized portion unremarkable. Spleen: Size and appearance within normal limits. Right Kidney: Length: 11.7 cm. Echogenicity within normal limits. No mass or hydronephrosis visualized. Left Kidney: Length: 11.3 cm. Echogenicity within normal limits. No mass or hydronephrosis visualized. Abdominal  aorta: No aneurysm visualized. Other findings: None. IMPRESSION: 1. Heterogeneous hepatic parenchymal pattern with nodular contour consistent with the patient's known cirrhosis. No focal hepatic abnormality. 2.  Gallstones.  Negative Murphy sign.  No biliary distention. Electronically Signed   By: Marcello Moores  Register   On: 06/25/2016 11:47

## 2016-06-29 NOTE — Progress Notes (Signed)
CC'ED TO PCP 

## 2016-06-29 NOTE — Assessment & Plan Note (Signed)
Continue to follow with [facility] per patient preference.

## 2016-06-29 NOTE — Assessment & Plan Note (Signed)
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 No prior colonoscopy. Patient denies rectal bleeding or bowel concerns.  Plan for colonoscopy via stoma and endoscopic evaluation of rectal pouch with deep sedation in the OR given prior alcohol use.  I have discussed the risks, alternatives, benefits with regards to but not limited to the risk of reaction to medication, bleeding, infection, perforation and the patient is agreeable to proceed. Written consent to be obtained.  Patient may use G-tube for bowel preparation. No enema planned given patient's anatomy.

## 2016-06-29 NOTE — Patient Instructions (Signed)
Frank Castillo  06/29/2016     @PREFPERIOPPHARMACY @   Your procedure is scheduled on 07/02/2016.  Report to [name] at 8:15 A.M.  Call this number if you have problems the morning of surgery:  [phone]   Remember:  Do not eat food or drink liquids after midnight.  Take these medicines the morning of surgery with A SIP OF WATER : Zyrtec and Lexapro   Do not wear jewelry, make-up or nail polish.  Do not wear lotions, powders, or perfumes, or deoderant.  Do not shave 48 hours prior to surgery.  Men may shave face and neck.  Do not bring valuables to the hospital.  [facility] is not responsible for any belongings or valuables.  Contacts, dentures or bridgework may not be worn into surgery.  Leave your suitcase in the car.  After surgery it may be brought to your room.  For patients admitted to the hospital, discharge time will be determined by your treatment team.  Patients discharged the day of surgery will not be allowed to drive home.   Name and phone number of your driver:   family Special instructions:  n/a  Please read over the following fact sheets that you were given. Care and Recovery After Surgery    Esophagogastroduodenoscopy Introduction Esophagogastroduodenoscopy (EGD) is a procedure to examine the lining of the esophagus, stomach, and first part of the small intestine (duodenum). This procedure is done to check for problems such as inflammation, bleeding, ulcers, or growths. During this procedure, a long, flexible, lighted tube with a camera attached (endoscope) is inserted down the throat. Tell a health care provider about:  Any allergies you have.  All medicines you are taking, including vitamins, herbs, eye drops, creams, and over-the-counter medicines.  Any problems you or family members have had with anesthetic medicines.  Any blood disorders you have.  Any surgeries you have had.  Any medical conditions you have.  Whether you are pregnant  or may be pregnant. What are the risks? Generally, this is a safe procedure. However, problems may occur, including:  Infection.  Bleeding.  A tear (perforation) in the esophagus, stomach, or duodenum.  Trouble breathing.  Excessive sweating.  Spasms of the larynx.  A slowed heartbeat.  Low blood pressure. What happens before the procedure?  Follow instructions from your health care provider about eating or drinking restrictions.  Ask your health care provider about:  Changing or stopping your regular medicines. This is especially important if you are taking diabetes medicines or blood thinners.  Taking medicines such as aspirin and ibuprofen. These medicines can thin your blood. Do not take these medicines before your procedure if your health care provider instructs you not to.  Plan to have someone take you home after the procedure.  If you wear dentures, be ready to remove them before the procedure. What happens during the procedure?  To reduce your risk of infection, your health care team will wash or sanitize their hands.  An IV tube will be put in a vein in your hand or arm. You will get medicines and fluids through this tube.  You will be given one or more of the following:  A medicine to help you relax (sedative).  A medicine to numb the area (local anesthetic). This medicine may be sprayed into your throat. It will make you feel more comfortable and keep you from gagging or coughing during the procedure.  A medicine for pain.  A mouth guard may be  placed in your mouth to protect your teeth and to keep you from biting on the endoscope.  You will be asked to lie on your left side.  The endoscope will be lowered down your throat into your esophagus, stomach, and duodenum.  Air will be put into the endoscope. This will help your health care provider see better.  The lining of your esophagus, stomach, and duodenum will be examined.  Your health care  provider may:  Take a tissue sample so it can be looked at in a lab (biopsy).  Remove growths.  Remove objects (foreign bodies) that are stuck.  Treat any bleeding with medicines or other devices that stop tissue from bleeding.  Widen (dilate) or stretch narrowed areas of your esophagus and stomach.  The endoscope will be taken out. The procedure may vary among health care providers and hospitals. What happens after the procedure?  Your blood pressure, heart rate, breathing rate, and blood oxygen level will be monitored often until the medicines you were given have worn off.  Do not eat or drink anything until the numbing medicine has worn off and your gag reflex has returned. This information is not intended to replace advice given to you by your health care provider. Make sure you discuss any questions you have with your health care provider. Document Released: 09/11/2004 Document Revised: 10/17/2015 Document Reviewed: 04/04/2015  2017 Elsevier  Colonoscopy, Adult A colonoscopy is an exam to look at the entire large intestine. During the exam, a lubricated, bendable tube is inserted into the anus and then passed into the rectum, colon, and other parts of the large intestine. A colonoscopy is often done as a part of normal colorectal screening or in response to certain symptoms, such as anemia, persistent diarrhea, abdominal pain, and blood in the stool. The exam can help screen for and diagnose medical problems, including:  Tumors.  Polyps.  Inflammation.  Areas of bleeding. Tell a health care provider about:  Any allergies you have.  All medicines you are taking, including vitamins, herbs, eye drops, creams, and over-the-counter medicines.  Any problems you or family members have had with anesthetic medicines.  Any blood disorders you have.  Any surgeries you have had.  Any medical conditions you have.  Any problems you have had passing stool. What are the  risks? Generally, this is a safe procedure. However, problems may occur, including:  Bleeding.  A tear in the intestine.  A reaction to medicines given during the exam.  Infection (rare). What happens before the procedure? Eating and drinking restrictions  Follow instructions from your health care provider about eating and drinking, which may include:  A few days before the procedure - follow a low-fiber diet. Avoid nuts, seeds, dried fruit, raw fruits, and vegetables.  1-3 days before the procedure - follow a clear liquid diet. Drink only clear liquids, such as clear broth or bouillon, black coffee or tea, clear juice, clear soft drinks or sports drinks, gelatin desert, and popsicles. Avoid any liquids that contain red or purple dye.  On the day of the procedure - do not eat or drink anything during the 2 hours before the procedure, or within the time period that your health care provider recommends. Bowel prep  If you were prescribed an oral bowel prep to clean out your colon:  Take it as told by your health care provider. Starting the day before your procedure, you will need to drink a large amount of medicated liquid. The liquid  will cause you to have multiple loose stools until your stool is almost clear or light green.  If your skin or anus gets irritated from diarrhea, you may use these to relieve the irritation:  Medicated wipes, such as adult wet wipes with aloe and vitamin E.  A skin soothing-product like petroleum jelly.  If you vomit while drinking the bowel prep, take a break for up to 60 minutes and then begin the bowel prep again. If vomiting continues and you cannot take the bowel prep without vomiting, call your health care provider. General instructions  Ask your health care provider about changing or stopping your regular medicines. This is especially important if you are taking diabetes medicines or blood thinners.  Plan to have someone take you home from the  hospital or clinic. What happens during the procedure?  An IV tube may be inserted into one of your veins.  You will be given medicine to help you relax (sedative).  To reduce your risk of infection:  Your health care team will wash or sanitize their hands.  Your anal area will be washed with soap.  You will be asked to lie on your side with your knees bent.  Your health care provider will lubricate a long, thin, flexible tube. The tube will have a camera and a light on the end.  The tube will be inserted into your anus.  The tube will be gently eased through your rectum and colon.  Air will be delivered into your colon to keep it open. You may feel some pressure or cramping.  The camera will be used to take images during the procedure.  A small tissue sample may be removed from your body to be examined under a microscope (biopsy). If any potential problems are found, the tissue will be sent to a lab for testing.  If small polyps are found, your health care provider may remove them and have them checked for cancer cells.  The tube that was inserted into your anus will be slowly removed. The procedure may vary among health care providers and hospitals. What happens after the procedure?  Your blood pressure, heart rate, breathing rate, and blood oxygen level will be monitored until the medicines you were given have worn off.  Do not drive for 24 hours after the exam.  You may have a small amount of blood in your stool.  You may pass gas and have mild abdominal cramping or bloating due to the air that was used to inflate your colon during the exam.  It is up to you to get the results of your procedure. Ask your health care provider, or the department performing the procedure, when your results will be ready. This information is not intended to replace advice given to you by your health care provider. Make sure you discuss any questions you have with your health care  provider. Document Released: 05/08/2000 Document Revised: 11/29/2015 Document Reviewed: 07/23/2015 Elsevier Interactive Patient Education  2017 [employer].

## 2016-06-30 ENCOUNTER — Encounter (HOSPITAL_COMMUNITY)
Admission: RE | Admit: 2016-06-30 | Discharge: 2016-06-30 | Disposition: A | Discharge status: Home / Self Care | Payer: BLUE CROSS/BLUE SHIELD | Source: Ambulatory Visit | Attending: Internal Medicine | Admitting: Internal Medicine

## 2016-06-30 ENCOUNTER — Encounter (HOSPITAL_COMMUNITY): Payer: Self-pay

## 2016-07-02 ENCOUNTER — Ambulatory Visit (HOSPITAL_COMMUNITY): Payer: BLUE CROSS/BLUE SHIELD | Admitting: Anesthesiology

## 2016-07-02 ENCOUNTER — Ambulatory Visit (HOSPITAL_COMMUNITY)
Admission: RE | Admit: 2016-07-02 | Discharge: 2016-07-02 | Disposition: A | Discharge status: Home / Self Care | Payer: BLUE CROSS/BLUE SHIELD | Source: Ambulatory Visit | Attending: Internal Medicine | Admitting: Internal Medicine

## 2016-07-02 ENCOUNTER — Encounter (HOSPITAL_COMMUNITY)
Admission: RE | Disposition: A | Discharge status: Home / Self Care | Payer: Self-pay | Source: Ambulatory Visit | Attending: Internal Medicine

## 2016-07-02 ENCOUNTER — Encounter (HOSPITAL_COMMUNITY): Payer: Self-pay | Admitting: *Deleted

## 2016-07-02 ENCOUNTER — Ambulatory Visit (HOSPITAL_COMMUNITY): Payer: Self-pay | Admitting: Adult Health

## 2016-07-02 ENCOUNTER — Other Ambulatory Visit (HOSPITAL_COMMUNITY): Payer: Self-pay

## 2016-07-02 DIAGNOSIS — K222 Esophageal obstruction: Secondary | ICD-10-CM

## 2016-07-02 DIAGNOSIS — Z1211 Encounter for screening for malignant neoplasm of colon: Secondary | ICD-10-CM | POA: Diagnosis not present

## 2016-07-02 DIAGNOSIS — Z923 Personal history of irradiation: Secondary | ICD-10-CM | POA: Diagnosis not present

## 2016-07-02 DIAGNOSIS — K3189 Other diseases of stomach and duodenum: Secondary | ICD-10-CM | POA: Insufficient documentation

## 2016-07-02 DIAGNOSIS — Z9221 Personal history of antineoplastic chemotherapy: Secondary | ICD-10-CM | POA: Insufficient documentation

## 2016-07-02 DIAGNOSIS — F17210 Nicotine dependence, cigarettes, uncomplicated: Secondary | ICD-10-CM | POA: Insufficient documentation

## 2016-07-02 DIAGNOSIS — K7460 Unspecified cirrhosis of liver: Secondary | ICD-10-CM | POA: Diagnosis not present

## 2016-07-02 DIAGNOSIS — K228 Other specified diseases of esophagus: Secondary | ICD-10-CM | POA: Diagnosis not present

## 2016-07-02 DIAGNOSIS — R1310 Dysphagia, unspecified: Secondary | ICD-10-CM | POA: Diagnosis present

## 2016-07-02 DIAGNOSIS — I10 Essential (primary) hypertension: Secondary | ICD-10-CM | POA: Diagnosis not present

## 2016-07-02 DIAGNOSIS — IMO0002 Reserved for concepts with insufficient information to code with codable children: Secondary | ICD-10-CM

## 2016-07-02 DIAGNOSIS — Z85818 Personal history of malignant neoplasm of other sites of lip, oral cavity, and pharynx: Secondary | ICD-10-CM | POA: Insufficient documentation

## 2016-07-02 HISTORY — PX: COLONOSCOPY WITH PROPOFOL: SHX5780

## 2016-07-02 HISTORY — PX: ESOPHAGOGASTRODUODENOSCOPY (EGD) WITH PROPOFOL: SHX5813

## 2016-07-02 HISTORY — PX: BIOPSY: SHX5522

## 2016-07-02 HISTORY — PX: MALONEY DILATION: SHX5535

## 2016-07-02 SURGERY — COLONOSCOPY WITH PROPOFOL
Anesthesia: Monitor Anesthesia Care

## 2016-07-02 MED ORDER — CHLORHEXIDINE GLUCONATE CLOTH 2 % EX PADS: 6.0000 | MEDICATED_PAD | Freq: Once | CUTANEOUS | Status: DC

## 2016-07-02 MED ORDER — FENTANYL CITRATE (PF) 100 MCG/2ML IJ SOLN
25.0000 ug | INTRAMUSCULAR | Status: AC
  Administered 2016-07-02 (×2): 25 ug via INTRAVENOUS

## 2016-07-02 MED ORDER — MIDAZOLAM HCL 2 MG/2ML IJ SOLN
1.0000 mg | INTRAMUSCULAR | Status: AC
  Administered 2016-07-02: 2 mg via INTRAVENOUS

## 2016-07-02 MED ORDER — LACTATED RINGERS IV SOLN
INTRAVENOUS | Status: DC
  Administered 2016-07-02: 1000 mL via INTRAVENOUS

## 2016-07-02 MED ORDER — FENTANYL CITRATE (PF) 100 MCG/2ML IJ SOLN
INTRAMUSCULAR | Status: AC
  Filled 2016-07-02: qty 2

## 2016-07-02 MED ORDER — PROPOFOL 10 MG/ML IV BOLUS
INTRAVENOUS | Status: AC
Start: 2016-07-02 — End: 2016-07-02
  Filled 2016-07-02: qty 40

## 2016-07-02 MED ORDER — METOPROLOL TARTRATE 5 MG/5ML IV SOLN
5.0000 mg | Freq: Once | INTRAVENOUS | Status: AC
  Administered 2016-07-02: 5 mg via INTRAVENOUS

## 2016-07-02 MED ORDER — PROPOFOL 10 MG/ML IV BOLUS
INTRAVENOUS | Status: AC
  Filled 2016-07-02: qty 20

## 2016-07-02 MED ORDER — METOPROLOL TARTRATE 5 MG/5ML IV SOLN
INTRAVENOUS | Status: AC
  Filled 2016-07-02: qty 5

## 2016-07-02 MED ORDER — PROPOFOL 500 MG/50ML IV EMUL
INTRAVENOUS | Status: DC | PRN
  Administered 2016-07-02: 125 ug/kg/min via INTRAVENOUS

## 2016-07-02 MED ORDER — LIDOCAINE VISCOUS 2 % MT SOLN
OROMUCOSAL | Status: AC
  Filled 2016-07-02: qty 15

## 2016-07-02 MED ORDER — MIDAZOLAM HCL 2 MG/2ML IJ SOLN
INTRAMUSCULAR | Status: AC
  Filled 2016-07-02: qty 2

## 2016-07-02 MED ORDER — LIDOCAINE VISCOUS 2 % MT SOLN
5.0000 mL | Freq: Two times a day (BID) | OROMUCOSAL | Status: AC
  Administered 2016-07-02 (×2): 5 mL via OROMUCOSAL

## 2016-07-02 MED ORDER — ONDANSETRON HCL 4 MG/2ML IJ SOLN
INTRAMUSCULAR | Status: AC
  Filled 2016-07-02: qty 2

## 2016-07-02 NOTE — Addendum Note (Signed)
Addendum  created 07/02/16 1343 by Laretta Alstrom, CRNA   Charge Capture section accepted

## 2016-07-02 NOTE — Progress Notes (Addendum)
Patient status post colostomy.  Colonoscopy via stoma 9:54 am -10:08am, Flexible Sigmoidoscopy via rectum 10:10am-10:24 am

## 2016-07-02 NOTE — Interval H&P Note (Signed)
History and Physical Interval Note:  07/02/2016 9:10 AM  Frank Castillo  has presented today for surgery, with the diagnosis of colon CA screen, odynophagia, dysphagia, h/o colostomy, h/o tonsillar CA  The various methods of treatment have been discussed with the patient and family. After consideration of risks, benefits and other options for treatment, the patient has consented to  Procedure(s) with comments: COLONOSCOPY WITH PROPOFOL (N/A) - 9:45am ESOPHAGOGASTRODUODENOSCOPY (EGD) WITH PROPOFOL (N/A) MALONEY DILATION (N/A) as a surgical intervention .  The patient's history has been reviewed, patient examined, no change in status, stable for surgery.  I have reviewed the patient's chart and labs.  Questions were answered to the patient's satisfaction.     Frank Castillo  No change. Patient states odynophagia and dysphagia have gradually steadily improved. Have not totally resolved. Plan for EGD/ possible ED as feasible/ appropriate and colonoscopy via ostomy and per rectum per plan.  The risks, benefits, limitations, imponderables and alternatives regarding both EGD and colonoscopy have been reviewed with the patient. Questions have been answered. All parties agreeable.

## 2016-07-02 NOTE — Op Note (Signed)
[facility] Patient Name: Frank Castillo Procedure Date: 07/02/2016 9:41 AM MRN: [id number] Date of Birth: 1951-03-10 Attending MD: Norvel Richards , MD CSN: [id number] Age: 66 Admit Type: Outpatient Procedure:                Colonoscopy via ostomy and per rectum Indications:              Screening for colorectal malignant neoplasm; done                            in preparation for colostomy takedown Providers:                Norvel Richards, MD, Hinton Rao, RN, Aram Candela Referring MD:             Ancil Linsey MD, Leighton Ruff, MD Medicines:                Propofol per Anesthesia Complications:            No immediate complications. Estimated Blood Loss:     Estimated blood loss: none. Procedure:                Pre-Anesthesia Assessment:                           - Prior to the procedure, a History and Physical                            was performed, and patient medications and                            allergies were reviewed. The patient's tolerance of                            previous anesthesia was also reviewed. The risks                            and benefits of the procedure and the sedation                            options and risks were discussed with the patient.                            All questions were answered, and informed consent                            was obtained. Prior Anticoagulants: The patient has                            taken no previous anticoagulant or antiplatelet                            agents. ASA Grade Assessment: III - A patient with  severe systemic disease. After reviewing the risks                            and benefits, the patient was deemed in                            satisfactory condition to undergo the procedure.                           After obtaining informed consent, the colonoscope                            was passed under direct vision.  Throughout the                            procedure, the patient's blood pressure, pulse, and                            oxygen saturations were monitored continuously. The                            [id number] scope was introduced through                            the anus and advanced to the the cecum, identified                            by appendiceal orifice and ileocecal valve. The                            [id number] scope was introduced through                            the and advanced to the. The ileocecal valve, the                            appendiceal orifice and the rectum were                            photographed. The entire colon was well visualized.                            The colonoscopy was performed without difficulty.                            The patient tolerated the procedure well. The                            quality of the bowel preparation was adequate. Scope In: 9:54:18 AM Scope Out: 10:24:30 AM Scope Withdrawal Time: 0 hours 25 minutes 26 seconds  Total Procedure Duration: 0 hours 30 minutes 12 seconds  Findings:      Colonoscopy through ostomy: Digital examination of the ostosomy was       somewhat stenotic. In  fact, the adult scope would not be admitted.       Subsequently, the pediatric colonoscope was used to easily intubate the       ostomy. It was advanced to the cecum. The patient had scattered residual       diverticula all the way to the cecum (distally more densely populated       than proximally). The remainder of the residual colonic mucosa in this       segment appeared normal. Examination the Hartman's pouch was then       undertaken. Sigmoid colon ended in a blind pouch at 25 cm.       Diverticulosis seen in this segment. No other abnormalities observed.       Retroflexion in the rectum performed. Rectal mucosa appeared normal.      The perianal and digital rectal examinations were normal. Impression:                - Residual colonic diverticulosis. Examination                            through colostomy and per rectum performed as                            described above. Moderate Sedation:      Moderate (conscious) sedation was personally administered by an       anesthesia professional. The following parameters were monitored: oxygen       saturation, heart rate, blood pressure, respiratory rate, EKG, adequacy       of pulmonary ventilation, and response to care. Total physician       intraservice time was 44 minutes. Recommendation:           - Patient has a contact number available for                            emergencies. The signs and symptoms of potential                            delayed complications were discussed with the                            patient. Return to normal activities tomorrow.                            Written discharge instructions were provided to the                            patient.                           - Resume previous diet.                           - Continue present medications.                           - Await pathology results.                           -  Repeat colonoscopy in 10 years for screening                            purposes.                           - Return to GI office in 3 months. Patient is good                            to go for colostomy takedown and reanastomosis from                            a GI standpoint. See EGD report. Procedure Code(s):        --- Professional ---                           [phone], Colonoscopy, flexible; diagnostic, including                            collection of specimen(s) by brushing or washing,                            when performed (separate procedure) Diagnosis Code(s):        --- Professional ---                           Z12.11, Encounter for screening for malignant                            neoplasm of colon CPT copyright 2016 [employer]. All rights  reserved. The codes documented in this report are preliminary and upon coder review may  be revised to meet current compliance requirements. [name], MD Norvel Richards, MD 07/02/2016 10:55:53 AM This report has been signed electronically. Number of Addenda: 0

## 2016-07-02 NOTE — Discharge Instructions (Addendum)
Dysphagia Diet Level 2, Mechanically Altered The dysphagia level 2 diet includes foods that are blended, chopped, ground, or mashed so they are easier to chew and swallow. The foods are soft, moist, and can be chopped into -inch chunks (such as pancakes, pasta, and bananas). In order to be on this diet, you must be able to chew. This diet helps you transition between the pureed textures of the dysphagia level 1 diet to more solid textures. This diet is helpful for people with mild to moderate swallowing difficulties. It reduces the risk of food getting caught in the windpipe, trachea, or lungs. You may need help or supervision during meals while following this diet so that you eat safely. You will be on this diet until your health care provider advances the texture of your diet. What do I need to know about this diet? Foods  You may eat foods that are soft and moist.  You may need to use a blender, whisk, or masher to soften some of your foods.  You can moisten foods with gravies, sauces, vegetable or fruit juice, milk, half and half, or water when blending, mashing, or grinding your foods to the right consistency.  If you were on the dysphagia level 1 diet, you may still eat any of the foods included in that diet.  Avoid foods that are dry, hard, sticky, chewy, coarse, and crunchy. Also avoid large cuts of food.  Take small bites. Each bite should contain  inch or less of food. Liquids  Avoid liquids with seeds and chunks.  Thicken liquids, if instructed by your health care provider. Your health care provider will tell you the consistency to which you should thicken your liquids for safe swallowing. To thicken a liquid, use a commercial thickener or a thickening food (such as rice cereal or potato flakes). Ask your health care provider for specific recommendations on thickeners. See your dietitian or health care provider regularly for help with your dietary changes. What foods can I  eat? Grains  Store-bought soft breads that do not have nuts or seeds. Pancakes, sweet rolls, [place] pastries, and [place] toast that have been moistened with syrup or sauce to form a slurry when blended. Well-cooked pasta, noodles, and bread dressing. Well-cooked noodles and pasta in sauce. Moist macaroni and cheese. Soft dumplings or spaetzle with gravy or butter. Cooked cereals (including oatmeal). Low-texture dry cereals, such as rice puff, corn, or wheat-flake cereals, with milk (if thin liquids are not allowed, make sure all of the milk is absorbed by the cereal before eating it). Vegetables  Very soft, well-cooked vegetables in pieces less than  inch in size. Cooked potatoes that are moist, not crispy, and with sauce. Fruits  Canned or cooked fruits that are soft or moist and do not have skin or seeds. Fresh, soft bananas. Fruit juices with a small amount of pulp (if thin liquids are allowed). Gelatin or plain gelatin with canned fruit, except pineapple. Meat and Other Protein Sources  Tender, moist meats, poultry, or fish cooked with gravy or sauce and cubed to -inch bites or smaller. Ground meat. Moist meatball or meatloaf. Fish without bones. Moist casseroles without rice. Tuna, egg, or meat salad without chunks or hard-to-chew vegetables, such as celery and onions. Smooth quiche without large chunks. Scrambled, poached, or soft-cooked eggs with butter, margarine, sauce, or gravy. Tofu. Well-cooked, moistened and mashed beans, peas, baked beans, and other legumes. Casseroles without rice (such as tuna noodle casserole or soft moist meat lasagna). Dairy  Cream cheese. Yogurt. Cottage cheese. Ask your health care provider if milk is allowed. Sweets/Desserts  Pudding. Custard. Soft fruit pies with crust on the bottom only. Crisps and cobblers without seeds or nuts and with soft crusts. Soft, moist cakes. Icing. Pre-gelled cookies. Soft, moist cookies dunked in milk, coffee, or another liquid.  Jelly. Soft, smooth chocolate bars that are easily chewed. Jams and preserves without seeds. Ask your health care provider whether you can have frozen desserts. Fats and Oils  Butter. Margarine. Cream for cereal, depending on liquid consistency allowed. Gravy. Cream sauces. Mayonnaise. Salad dressings. Cream cheese. Cheese spreads, plain or with soft fruits or vegetables added. Sour cream. Sour cream dips with soft fruits or vegetables added. Whipped toppings. Other  Sauces and salsas that have soft chunks that are about  inch or smaller. The items listed above may not be a complete list of recommended foods or beverages. Contact your dietitian for more options.  What foods are not recommended? Grains  All breads not listed in the recommended list. Breads that are hard or have nuts or seeds. Coarse cereals. Cereals that have nuts, seeds, dried fruits, or coconut. Rice. Corn. Vegetables  Whole, raw, frozen, or dried vegetables. Tough, fibrous, chewy, or stringy cooked vegetables, such as celery, peas, broccoli, cabbage, Brussels sprouts, and asparagus. Potato skins. Potato and other vegetable chips. Fried or French-fried potatoes. Cooked corn and peas. Fruits  Whole raw, frozen, or dried fruits, including coconut. Pineapple. Fruits with seeds. Meat and Other Protein Sources  Dry, tough meats, such as bacon, sausage, and hot dogs. Cheese slices and cubes. Peanut butter. Hard boiled or fried eggs. Nuts. Seeds. Pizza. Sandwiches. Dry casseroles or casseroles with rice or large chunks. Dairy  Yogurt with nuts, seeds, or large chunks. Sweets/Desserts  Coarse, hard, chewy, or sticky desserts. Any dessert with nuts, seeds, coconut, pineapple, or dried fruit. Ask your health care provider whether you can have frozen desserts. Fats and Oils  Avoid fats with chunky, large textures, such as those with nuts or fruits. Other  Soups and casseroles with large chunks. The items listed above may not be a  complete list of foods and beverages to avoid. Contact your dietitian for more information.  This information is not intended to replace advice given to you by your health care provider. Make sure you discuss any questions you have with your health care provider. Document Released: 05/11/2005 Document Revised: 10/17/2015 Document Reviewed: 04/24/2013 Elsevier Interactive Patient Education  2017 [employer].    Gastroesophageal Reflux Disease, Adult Introduction Normally, food travels down the esophagus and stays in the stomach to be digested. If a person has gastroesophageal reflux disease (GERD), food and stomach acid move back up into the esophagus. When this happens, the esophagus becomes sore and swollen (inflamed). Over time, GERD can make small holes (ulcers) in the lining of the esophagus. Follow these instructions at home: Diet  Follow a diet as told by your doctor. You may need to avoid foods and drinks such as:  Coffee and tea (with or without caffeine).  Drinks that contain alcohol.  Energy drinks and sports drinks.  Carbonated drinks or sodas.  Chocolate and cocoa.  Peppermint and mint flavorings.  Garlic and onions.  Horseradish.  Spicy and acidic foods, such as peppers, chili powder, curry powder, vinegar, hot sauces, and BBQ sauce.  Citrus fruit juices and citrus fruits, such as oranges, lemons, and limes.  Tomato-based foods, such as red sauce, chili, salsa, and pizza with red sauce.  Fried and fatty foods, such as donuts, french fries, potato chips, and high-fat dressings.  High-fat meats, such as hot dogs, rib eye steak, sausage, ham, and bacon.  High-fat dairy items, such as whole milk, butter, and cream cheese.  Eat small meals often. Avoid eating large meals.  Avoid drinking large amounts of liquid with your meals.  Avoid eating meals during the 2-3 hours before bedtime.  Avoid lying down right after you eat.  Do not exercise right after you  eat. General instructions  Pay attention to any changes in your symptoms.  Take over-the-counter and prescription medicines only as told by your doctor. Do not take aspirin, ibuprofen, or other NSAIDs unless your doctor says it is okay.  Do not use any tobacco products, including cigarettes, chewing tobacco, and e-cigarettes. If you need help quitting, ask your doctor.  Wear loose clothes. Do not wear anything tight around your waist.  Raise (elevate) the head of your bed about 6 inches (15 cm).  Try to lower your stress. If you need help doing this, ask your doctor.  If you are overweight, lose an amount of weight that is healthy for you. Ask your doctor about a safe weight loss goal.  Keep all follow-up visits as told by your doctor. This is important. Contact a doctor if:  You have new symptoms.  You lose weight and you do not know why it is happening.  You have trouble swallowing, or it hurts to swallow.  You have wheezing or a cough that keeps happening.  Your symptoms do not get better with treatment.  You have a hoarse voice. Get help right away if:  You have pain in your arms, neck, jaw, teeth, or back.  You feel sweaty, dizzy, or light-headed.  You have chest pain or shortness of breath.  You throw up (vomit) and your throw up looks like blood or coffee grounds.  You pass out (faint).  Your poop (stool) is bloody or black.  You cannot swallow, drink, or eat. This information is not intended to replace advice given to you by your health care provider. Make sure you discuss any questions you have with your health care provider. Document Released: 10/28/2007 Document Revised: 10/17/2015 Document Reviewed: 09/05/2014  2017 Elsevier Diverticulosis Diverticulosis is the condition that develops when small pouches (diverticula) form in the wall of your colon. Your colon, or large intestine, is where water is absorbed and stool is formed. The pouches form when the  inside layer of your colon pushes through weak spots in the outer layers of your colon. CAUSES  No one knows exactly what causes diverticulosis. RISK FACTORS  Being older than 8. Your risk for this condition increases with age. Diverticulosis is rare in people younger than 40 years. By age 69, almost everyone has it.  Eating a low-fiber diet.  Being frequently constipated.  Being overweight.  Not getting enough exercise.  Smoking.  Taking over-the-counter pain medicines, like aspirin and ibuprofen. SYMPTOMS  Most people with diverticulosis do not have symptoms. DIAGNOSIS  Because diverticulosis often has no symptoms, health care providers often discover the condition during an exam for other colon problems. In many cases, a health care provider will diagnose diverticulosis while using a flexible scope to examine the colon (colonoscopy). TREATMENT  If you have never developed an infection related to diverticulosis, you may not need treatment. If you have had an infection before, treatment may include:  Eating more fruits, vegetables, and grains.  Taking a fiber  supplement.  Taking a live bacteria supplement (probiotic).  Taking medicine to relax your colon. HOME CARE INSTRUCTIONS   Drink at least 6-8 glasses of water each day to prevent constipation.  Try not to strain when you have a bowel movement.  Keep all follow-up appointments. If you have had an infection before:  Increase the fiber in your diet as directed by your health care provider or dietitian.  Take a dietary fiber supplement if your health care provider approves.  Only take medicines as directed by your health care provider. SEEK MEDICAL CARE IF:   You have abdominal pain.  You have bloating.  You have cramps.  You have not gone to the bathroom in 3 days. SEEK IMMEDIATE MEDICAL CARE IF:   Your pain gets worse.  Yourbloating becomes very bad.  You have a fever or chills, and your symptoms  suddenly get worse.  You begin vomiting.  You have bowel movements that are bloody or black. MAKE SURE YOU:  Understand these instructions.  Will watch your condition.  Will get help right away if you are not doing well or get worse. This information is not intended to replace advice given to you by your health care provider. Make sure you discuss any questions you have with your health care provider. Document Released: 02/06/2004 Document Revised: 05/16/2013 Document Reviewed: 04/05/2013 Elsevier Interactive Patient Education  2017 Elsevier Inc.  Colonoscopy Discharge Instructions  Read the instructions outlined below and refer to this sheet in the next few weeks. These discharge instructions provide you with general information on caring for yourself after you leave the hospital. Your doctor may also give you specific instructions. While your treatment has been planned according to the most current medical practices available, unavoidable complications occasionally occur. If you have any problems or questions after discharge, call Dr. Gala Romney at [phone]. ACTIVITY  You may resume your regular activity, but move at a slower pace for the next 24 hours.   Take frequent rest periods for the next 24 hours.   Walking will help get rid of the air and reduce the bloated feeling in your belly (abdomen).   No driving for 24 hours (because of the medicine (anesthesia) used during the test).    Do not sign any important legal documents or operate any machinery for 24 hours (because of the anesthesia used during the test).  NUTRITION  Drink plenty of fluids.   You may resume your normal diet as instructed by your doctor.   Begin with a light meal and progress to your normal diet. Heavy or fried foods are harder to digest and may make you feel sick to your stomach (nauseated).   Avoid alcoholic beverages for 24 hours or as instructed.  MEDICATIONS  You may resume your normal medications  unless your doctor tells you otherwise.  WHAT YOU CAN EXPECT TODAY  Some feelings of bloating in the abdomen.   Passage of more gas than usual.   Spotting of blood in your stool or on the toilet paper.  IF YOU HAD POLYPS REMOVED DURING THE COLONOSCOPY:  No aspirin products for 7 days or as instructed.   No alcohol for 7 days or as instructed.   Eat a soft diet for the next 24 hours.  FINDING OUT THE RESULTS OF YOUR TEST Not all test results are available during your visit. If your test results are not back during the visit, make an appointment with your caregiver to find out the results. Do not  assume everything is normal if you have not heard from your caregiver or the medical facility. It is important for you to follow up on all of your test results.  SEEK IMMEDIATE MEDICAL ATTENTION IF:  You have more than a spotting of blood in your stool.   Your belly is swollen (abdominal distention).   You are nauseated or vomiting.   You have a temperature over 101.   You have abdominal pain or discomfort that is severe or gets worse throughout the day.    EGD Discharge instructions Please read the instructions outlined below and refer to this sheet in the next few weeks. These discharge instructions provide you with general information on caring for yourself after you leave the hospital. Your doctor may also give you specific instructions. While your treatment has been planned according to the most current medical practices available, unavoidable complications occasionally occur. If you have any problems or questions after discharge, please call your doctor. ACTIVITY  You may resume your regular activity but move at a slower pace for the next 24 hours.   Take frequent rest periods for the next 24 hours.   Walking will help expel (get rid of) the air and reduce the bloated feeling in your abdomen.   No driving for 24 hours (because of the anesthesia (medicine) used during the test).    You may shower.   Do not sign any important legal documents or operate any machinery for 24 hours (because of the anesthesia used during the test).  NUTRITION  Drink plenty of fluids.   You may resume your normal diet.   Begin with a light meal and progress to your normal diet.   Avoid alcoholic beverages for 24 hours or as instructed by your caregiver.  MEDICATIONS  You may resume your normal medications unless your caregiver tells you otherwise.  WHAT YOU CAN EXPECT TODAY  You may experience abdominal discomfort such as a feeling of fullness or gas pains.  FOLLOW-UP  Your doctor will discuss the results of your test with you.  SEEK IMMEDIATE MEDICAL ATTENTION IF ANY OF THE FOLLOWING OCCUR:  Excessive nausea (feeling sick to your stomach) and/or vomiting.   Severe abdominal pain and distention (swelling).   Trouble swallowing.   Temperature over 101 F (37.8 C).   Rectal bleeding or vomiting of blood.     Colon diverticulosis and GERD information provided  Dysphagia 2 diet  Begin Dexilant 60 mg daily  7 day course of Carafate suspension 1 g before meals and at bedtime  Office visit with [place] in 3 months  Further recommendations to follow pending review of pathology report.

## 2016-07-02 NOTE — Anesthesia Postprocedure Evaluation (Signed)
Anesthesia Post Note  Patient: Frank Castillo  Procedure(s) Performed: Procedure(s) (LRB): COLONOSCOPY WITH PROPOFOL (N/A) ESOPHAGOGASTRODUODENOSCOPY (EGD) WITH PROPOFOL (N/A) MALONEY DILATION (N/A) BIOPSY  Patient location during evaluation: PACU Anesthesia Type: MAC Level of consciousness: awake and alert and oriented Pain management: satisfactory to patient Vital Signs Assessment: post-procedure vital signs reviewed and stable Respiratory status: spontaneous breathing and respiratory function stable Cardiovascular status: blood pressure returned to baseline and stable Postop Assessment: no headache and adequate PO intake Anesthetic complications: no     Last Vitals:  Vitals:   07/02/16 0915 07/02/16 0920  BP: 132/85   Pulse:    Resp: 20 19  Temp:      Last Pain:  Vitals:   07/02/16 0837  TempSrc: Oral                 Frank Castillo

## 2016-07-02 NOTE — Anesthesia Preprocedure Evaluation (Addendum)
Anesthesia Evaluation  Patient identified by MRN, date of birth, ID bandGeneral Assessment Comment:Tonsillar ca  Reviewed: Allergy & Precautions, NPO status , Patient's Chart, lab work & pertinent test results  Airway Mallampati: II  TM Distance: >3 FB Neck ROM: Full    Dental  (+) Dental Advisory Given, Poor Dentition   Pulmonary Current Smoker,    breath sounds clear to auscultation       Cardiovascular hypertension, Pt. on medications + dysrhythmias (a-fib on 03-30-16 EKG??. Pt asymptomatic. ) Atrial Fibrillation  Rhythm:Regular Rate:Normal     Neuro/Psych PSYCHIATRIC DISORDERS Depression    GI/Hepatic (+) Cirrhosis  (with portal htn)    substance abuse (etoh stopped 4 yrs ago)  alcohol use,   Endo/Other    Renal/GU      Musculoskeletal   Abdominal (+)  Abdomen: rigid.    Peds  Hematology   Anesthesia Other Findings   Reproductive/Obstetrics                            Anesthesia Physical Anesthesia Plan  ASA: III  Anesthesia Plan: MAC   Post-op Pain Management:    Induction: Intravenous  Airway Management Planned: Simple Face Mask  Additional Equipment:   Intra-op Plan:   Post-operative Plan:   Informed Consent: I have reviewed the patients History and Physical, chart, labs and discussed the procedure including the risks, benefits and alternatives for the proposed anesthesia with the patient or authorized representative who has indicated his/her understanding and acceptance.     Plan Discussed with:   Anesthesia Plan Comments:         Anesthesia Quick Evaluation

## 2016-07-02 NOTE — H&P (View-Only) (Signed)
Primary Care Physician:  Celedonio Savage, MD  Primary Gastroenterologist:  Garfield Cornea, MD Referring providers: Dr. Ancil Linsey ([place]) and Dr. Leighton Ruff  Chief Complaint  Patient presents with  . Colonoscopy    consult for TCS before colostomy is reversed  . EGD    consult d/t sore throat from radiation    HPI:  Frank Castillo is a 66 y.o. 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  Patient was diagnosed with cancer of the tonsillar fossa back in March 2017. He had a G-tube placed via interventional radiology which he continues to use. He received radiation therapy from March 29 and completed 10/09/2015. Received adjuvant chemotherapy during this time. He was hospitalized in April 2017 with severe mucositis. Hospitalized in May with esophageal/oral candidiasis, shingles, pancytopenia. In September PET scan showed resolution of left tonsillar mass and left neck adenopathy. No evidence of residual or locally recurrent tumor, adenopathy, distant metastatic disease. In November he was admitted with severe abdominal pain. Found to have diffuse peritonitis from perforated sigmoid colon requiring urgent colostomy with Hartman's procedure. As was done by Dr. Leighton Ruff. He has never had a colonoscopy. Dr. Marcello Moores is requesting colonoscopy through his stoma as well as endoscopic evaluation of the rectal pouch prior to consideration of reversal of his colostomy.  Dr. Whitney Muse is also requesting upper endoscopy because patient has ongoing throat pain. Slightly improved but still significant discomfort. He complains of pain with swallowing and therefore continues to use his G-tube quite a bit. He does consume 3 small meals daily orally. Uses his Jevity to supplement his diet. Has improved with swallowing pills. Complains of  painful swallowing. He has a stabbing pain which started after his radiation on the left neck and persist. Had been on opioids but now off for a while. Trying to wean off of ibuprofen. He denies heartburn. Denies abdominal pain. Most of his stools are loose. No blood per rectum or melena. Continues to have decreased saliva production. Patient has been followed by speech therapy as well as nutrition.  Patient also has a history of cirrhosis (related to prior alcohol abuse has been sober since 2013), followed yearly at [facility]. Last visit October 2017. Has to continue follow-up with [facility] for cirrhosis.  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    Abdominal ultrasound done locally last week showed gallstones, cirrhosis but no hepatoma.  CT abdomen and pelvis November 2017 showed gallstones, cirrhosis, free intraperitoneal air and fluid, thickened small bowel loops with hyperemia in the left upper quadrant, diffuse colonic diverticulosis, focal extraluminal gas collection adjacent to the urinary bladder favoring colonic perforation.  Current Outpatient Prescriptions  Medication Sig Dispense Refill  . cetirizine (ZYRTEC) 10 MG tablet Take 10 mg by mouth daily.    . Cholecalciferol (VITAMIN D PO) Take 1 tablet by mouth daily. Pt unsure of dose    . Cyanocobalamin (VITAMIN B12 PO) Take 1 tablet by mouth daily. Pt unsure of dose    . escitalopram (LEXAPRO) 20 MG tablet Take 1/2 tablet for 1 week then increase to 1 tablet daily. (Patient taking differently: Take 20 mg by mouth daily. ) 30 tablet 3  . sodium fluoride (FLUORISHIELD) 1.1 % GEL dental gel Instill one drop of gel per tooth space of fluoride tray. Place over teeth for 5 minutes. Remove.  Spit out excess. Repeat nightly. 125 mL 2  . Vitamins-Lipotropics (LIPOFLAVONOID) TABS Take 2 tablets by mouth 3 (three) times daily.     No current facility-administered medications for this visit.     Facility-Administered Medications Ordered in Other Visits  Medication Dose Route Frequency Provider Last Rate Last Dose  . 0.9 %  sodium chloride infusion   Intravenous Continuous Manon Hilding Kefalas, PA-C      . 0.9 %  sodium chloride infusion   Intravenous Continuous Manon Hilding Kefalas, PA-C      . 0.9 %  sodium chloride infusion   Intravenous Continuous Kelby Fam Penland, MD      . 0.9 %  sodium chloride infusion   Intravenous Continuous Patrici Ranks, MD      . 0.9 %  sodium chloride infusion   Intravenous Continuous Patrici Ranks, MD        Allergies as of 06/29/2016 - Review Complete 06/29/2016  Allergen Reaction Noted  . Bee venom Swelling 07/31/2015  . Other  11/28/2015    Past Medical History:  Diagnosis Date  . Cirrhosis ([place])    Followed at [facility]  . Hypertension   . Iron deficiency anemia 05/07/2016  . Portal hypertension ([place])   . Tonsillar mass    left    Past Surgical History:  Procedure Laterality Date  . HERNIA REPAIR Right   . LAPAROTOMY N/A 03/28/2016   Procedure: EXPLORATORY LAPAROTOMY, SIGMOID COLECTOMY, COLOSTOMY;  Surgeon: Leighton Ruff, MD;  Location: [facility];  Service: General;  Laterality: N/A;  . MOUTH SURGERY    . TONSILLECTOMY Left 07/09/2015   Procedure: LEFT TONSILLECTOMY;  Surgeon: Leta Baptist, MD;  Location: [place];  Service: ENT;  Laterality: Left;    Family History  Problem Relation Age of Onset  . Macular degeneration Mother   . Alcoholism Father     Social History   Social History  . Marital status: Single    Spouse name: N/A  . Number of children: 0  . Years of education: N/A   Occupational History  . Not on file.   Social History Main Topics  . Smoking status: Current Every Day Smoker    Packs/day: 0.50    Types: Cigarettes  . Smokeless tobacco: Never Used  . Alcohol use No     Comment: former abuse- quit 09/2011  . Drug use: No  . Sexual activity: Not on file   Other Topics Concern  . Not on file    Social History Narrative   Divorced.   No children.   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   Currently smoking approximately one pack per day.   Has been smoking off/on since age 43.      ROS:  General: Negative for anorexia, weight loss, fever, chills, fatigue, weakness.Patient weighed 180 pounds prior to cancer diagnosis March 2017. He was down to 137 pounds in November. Back up to 150 pounds today. Eyes: Negative for vision changes.  ENT: Negative for hoarseness,  nasal congestion. See history of present illness CV: Negative for chest pain, angina, palpitations, dyspnea on exertion, peripheral edema.  Respiratory: Negative for dyspnea at rest, dyspnea on exertion, cough, sputum, wheezing.  GI: See history of present illness. GU:  Negative for dysuria, hematuria, urinary incontinence, urinary frequency, nocturnal urination.  MS: Negative for joint pain, low back pain.  Derm: Negative for rash or itching.  Neuro: Negative for weakness, abnormal sensation, seizure, frequent headaches, memory  loss, confusion.  Psych: Negative for anxiety, depression, suicidal ideation, hallucinations.  Endo: Negative for unusual weight change.  Heme: Negative for bruising or bleeding. Allergy: Negative for rash or hives.    Physical Examination:  BP 131/81   Pulse 90   Temp 98.1 F (36.7 C) (Oral)   Ht 5\' 10"  (1.778 m)   Wt 150 lb 6.4 oz (68.2 kg)   BMI 21.58 kg/m    General: Well-nourished, well-developed in no acute distress.  Head: Normocephalic, atraumatic.   Eyes: Conjunctiva pink, no icterus. Mouth: Oropharyngeal mucosa moist and pink , no lesions erythema or exudate. Neck: Supple without thyromegaly, masses, or lymphadenopathy.  Lungs: Clear to auscultation bilaterally.  Heart: Regular rate and rhythm, no murmurs rubs or gallops.  Abdomen: Bowel sounds are normal, nontender, nondistended, no hepatosplenomegaly or masses, no abdominal bruits or    hernia , no  rebound or guarding.  Ostomy bag located left lower abdomen brown liquid stool present. G-tube located upper abdomen. Rectal: Deferred to time of colonoscopy Extremities: No lower extremity edema. No clubbing or deformities.  Neuro: Alert and oriented x 4 , grossly normal neurologically.  Skin: Warm and dry, no rash or jaundice.   Psych: Alert and cooperative, normal mood and affect.  Labs: Lab Results  Component Value Date   WBC 4.9 05/27/2016   HGB 10.7 (L) 05/27/2016   HCT 33.8 (L) 05/27/2016   MCV 87.6 05/27/2016   PLT 250 05/27/2016   Lab Results  Component Value Date   CREATININE 0.61 05/27/2016   BUN 12 05/27/2016   NA 130 (L) 05/27/2016   K 3.9 05/27/2016   CL 96 (L) 05/27/2016   CO2 29 05/27/2016   Lab Results  Component Value Date   ALT 9 (L) 05/27/2016   AST 13 (L) 05/27/2016   ALKPHOS 43 05/27/2016   BILITOT 0.2 (L) 05/27/2016   Lab Results  Component Value Date   IRON 15 (L) 05/06/2016   TIBC 235 (L) 05/06/2016   FERRITIN 303 05/27/2016   Lab Results  Component Value Date   [id number] 05/06/2016   Lab Results  Component Value Date   INR 0.95 03/28/2016   INR 1.05 08/20/2015     Imaging Studies: US Abdomen Complete  Result Date: 06/25/2016 CLINICAL DATA:  Cirrhosis. EXAM: ABDOMEN ULTRASOUND COMPLETE COMPARISON:  CT 03/28/2016 . FINDINGS: Gallbladder: Gallstones. Gallbladder wall thickness normal at 1.7 mm. Negative Murphy sign. Common bile duct: Diameter: 4.5 mm Liver: Liver has a heterogeneous parenchymal pattern with nodular contour consistent with history of cirrhosis. No focal hepatic abnormality identified. IVC: No abnormality visualized. Pancreas: Visualized portion unremarkable. Spleen: Size and appearance within normal limits. Right Kidney: Length: 11.7 cm. Echogenicity within normal limits. No mass or hydronephrosis visualized. Left Kidney: Length: 11.3 cm. Echogenicity within normal limits. No mass or hydronephrosis visualized. Abdominal  aorta: No aneurysm visualized. Other findings: None. IMPRESSION: 1. Heterogeneous hepatic parenchymal pattern with nodular contour consistent with the patient's known cirrhosis. No focal hepatic abnormality. 2.  Gallstones.  Negative Murphy sign.  No biliary distention. Electronically Signed   By: Marcello Moores  Register   On: 06/25/2016 11:47

## 2016-07-02 NOTE — Op Note (Signed)
[facility] Patient Name: Frank Castillo Procedure Date: 07/02/2016 8:58 AM MRN: [id number] Date of Birth: 10-Feb-1951 Attending MD: Norvel Richards , MD CSN: [id number] Age: 66 Admit Type: Outpatient Procedure:                Upper GI endoscopy with [name] diation and biopsy Indications:              Dysphagia, Odynophagia Providers:                Norvel Richards, MD, Hinton Rao, RN, Aram Candela Referring MD:              Medicines:                Propofol per Anesthesia Complications:            No immediate complications. Estimated Blood Loss:     Estimated blood loss was minimal. Procedure:                Pre-Anesthesia Assessment:                           - Prior to the procedure, a History and Physical                            was performed, and patient medications and                            allergies were reviewed. The patient's tolerance of                            previous anesthesia was also reviewed. The risks                            and benefits of the procedure and the sedation                            options and risks were discussed with the patient.                            All questions were answered, and informed consent                            was obtained. Prior Anticoagulants: The patient has                            taken no previous anticoagulant or antiplatelet                            agents. ASA Grade Assessment: III - A patient with                            severe systemic disease. After reviewing the risks  and benefits, the patient was deemed in                            satisfactory condition to undergo the procedure.                           After obtaining informed consent, the endoscope was                            passed under direct vision. Throughout the                            procedure, the patient's blood pressure, pulse, and             oxygen saturations were monitored continuously. The                            [id number]) scope was introduced through the                            mouth, and advanced to the second part of duodenum.                            The upper GI endoscopy was accomplished without                            difficulty. The patient tolerated the procedure                            well. Scope In: 9:29:20 AM Scope Out: 9:41:00 AM Total Procedure Duration: 0 hours 11 minutes 40 seconds  Findings:      Moderate mucosal changes were found. Cervical esophageal web; crepe       paper appeaance of the esophal mucosa diffusely with some denuding of       the mucosa through upper esophagus sphincter and proximal esophagus.       Geographic erosions present as well. However, the adult gastroscope       traversed the entire esophagus without resistance. Snake skinning or       fish scale appearance of the gastric mucosa with antral erosions       present. No ulcer or infiltrating process. No varices seen anywhere.       Patent pylorus. Duodenal bulbar and D2 erosionseen. Internal PEG bumper       readily identified on the anterior wall of stomach. The scope was       withdrawn. Dilation was performed with a Maloney dilator with mild       resistance at 52 Fr. The dilation site was examined following endoscope       reinsertion and showed improvement. Estimated blood loss was minimal.       Finally, Biopsies of the esophaus and gastric mucosa taken. Impression:               -Moderately severe esophagitis (radiation and                            reflux related). Dilated. status post biopsy after  dilation.                           - Abnormal stomach and duodenum?"status post gastric                            biopsy. Probable portal gastropathy. PEG tube in                            place. Moderate Sedation:      Moderate (conscious) sedation was  personally administered by an       anesthesia professional. The following parameters were monitored: oxygen       saturation, heart rate, blood pressure, respiratory rate, EKG, adequacy       of pulmonary ventilation, and response to care. Total physician       intraservice time was 17 minutes. Recommendation:           - Patient has a contact number available for                            emergencies. The signs and symptoms of potential                            delayed complications were discussed with the                            patient. Return to normal activities tomorrow.                            Written discharge instructions were provided to the                            patient.                           -Begin Dexilant 60 mg daily. Add Carafate                            suspension 1 g 4 times a day -7 days                           - Continue present medications.                           - No repeat upper endoscopy. Follow-up on paology                           - Return to GI office in 6 weeks. See colonoscopy                            report. Procedure Code(s):        --- Professional ---                           [phone], Esophagogastroduodenoscopy, flexible,  transoral; with biopsy, single or multiple                           43450, Dilation of esophagus, by unguided sound or                            bougie, single or multiple passes Diagnosis Code(s):        --- Professional ---                           K22.8, Other specified diseases of esophagus                           K31.89, Other diseases of stomach and duodenum                           R13.10, Dysphagia, unspecified CPT copyright 2016 [employer]. All rights reserved. The codes documented in this report are preliminary and upon coder review may  be revised to meet current compliance requirements. [name], MD Norvel Richards, MD 07/02/2016  10:39:56 AM This report has been signed electronically. Number of Addenda: 0

## 2016-07-02 NOTE — Transfer of Care (Signed)
Immediate Anesthesia Transfer of Care Note  Patient: Frank Castillo  Procedure(s) Performed: Procedure(s) with comments: COLONOSCOPY WITH PROPOFOL (N/A) - 9:45am ESOPHAGOGASTRODUODENOSCOPY (EGD) WITH PROPOFOL (N/A) MALONEY DILATION (N/A) BIOPSY - esophageal  Patient Location: PACU  Anesthesia Type:MAC  Level of Consciousness: awake, alert , oriented and patient cooperative  Airway & Oxygen Therapy: Patient Spontanous Breathing and Patient connected to nasal cannula oxygen  Post-op Assessment: Report given to RN and Post -op Vital signs reviewed and stable  Post vital signs: Reviewed and stable  Last Vitals:  Vitals:   07/02/16 0915 07/02/16 0920  BP: 132/85   Pulse:    Resp: 20 19  Temp:      Last Pain:  Vitals:   07/02/16 0837  TempSrc: Oral      Patients Stated Pain Goal: 8 (85/46/27 0350)  Complications: No apparent anesthesia complications

## 2016-07-05 ENCOUNTER — Encounter: Payer: Self-pay | Admitting: Internal Medicine

## 2016-07-06 ENCOUNTER — Encounter (HOSPITAL_BASED_OUTPATIENT_CLINIC_OR_DEPARTMENT_OTHER): Payer: BLUE CROSS/BLUE SHIELD

## 2016-07-06 ENCOUNTER — Other Ambulatory Visit: Payer: Self-pay | Admitting: General Surgery

## 2016-07-06 ENCOUNTER — Encounter (HOSPITAL_COMMUNITY): Payer: BLUE CROSS/BLUE SHIELD | Attending: Oncology | Admitting: Adult Health

## 2016-07-06 ENCOUNTER — Encounter (HOSPITAL_COMMUNITY): Payer: Self-pay | Admitting: Adult Health

## 2016-07-06 VITALS — BP 131/45 | HR 72 | Temp 97.7°F | Resp 18 | Wt 150.2 lb

## 2016-07-06 DIAGNOSIS — Z720 Tobacco use: Secondary | ICD-10-CM

## 2016-07-06 DIAGNOSIS — E46 Unspecified protein-calorie malnutrition: Secondary | ICD-10-CM

## 2016-07-06 DIAGNOSIS — R634 Abnormal weight loss: Secondary | ICD-10-CM

## 2016-07-06 DIAGNOSIS — F17200 Nicotine dependence, unspecified, uncomplicated: Secondary | ICD-10-CM

## 2016-07-06 DIAGNOSIS — C090 Malignant neoplasm of tonsillar fossa: Secondary | ICD-10-CM | POA: Diagnosis present

## 2016-07-06 DIAGNOSIS — Z452 Encounter for adjustment and management of vascular access device: Secondary | ICD-10-CM

## 2016-07-06 DIAGNOSIS — C099 Malignant neoplasm of tonsil, unspecified: Secondary | ICD-10-CM

## 2016-07-06 LAB — CBC WITH DIFFERENTIAL/PLATELET
BASOS PCT: 2 %
Basophils Absolute: 0.1 10*3/uL (ref 0.0–0.1)
Eosinophils Absolute: 0.2 10*3/uL (ref 0.0–0.7)
Eosinophils Relative: 3 %
HCT: 39.8 % (ref 39.0–52.0)
Hemoglobin: 13.2 g/dL (ref 13.0–17.0)
LYMPHS ABS: 0.9 10*3/uL (ref 0.7–4.0)
Lymphocytes Relative: 15 %
MCH: 29.3 pg (ref 26.0–34.0)
MCHC: 33.2 g/dL (ref 30.0–36.0)
MCV: 88.2 fL (ref 78.0–100.0)
MONOS PCT: 10 %
Monocytes Absolute: 0.6 10*3/uL (ref 0.1–1.0)
Neutro Abs: 4.3 10*3/uL (ref 1.7–7.7)
Neutrophils Relative %: 70 %
Platelets: 271 10*3/uL (ref 150–400)
RBC: 4.51 MIL/uL (ref 4.22–5.81)
RDW: 19.1 % — AB (ref 11.5–15.5)
WBC: 6.1 10*3/uL (ref 4.0–10.5)

## 2016-07-06 LAB — COMPREHENSIVE METABOLIC PANEL
ALBUMIN: 4.2 g/dL (ref 3.5–5.0)
ALT: 11 U/L — ABNORMAL LOW (ref 17–63)
ANION GAP: 9 (ref 5–15)
AST: 13 U/L — ABNORMAL LOW (ref 15–41)
Alkaline Phosphatase: 46 U/L (ref 38–126)
BUN: 13 mg/dL (ref 6–20)
CALCIUM: 9.5 mg/dL (ref 8.9–10.3)
CO2: 26 mmol/L (ref 22–32)
Chloride: 96 mmol/L — ABNORMAL LOW (ref 101–111)
Creatinine, Ser: 0.66 mg/dL (ref 0.61–1.24)
GFR calc non Af Amer: >60 mL/min (ref >60)
GLUCOSE: 96 mg/dL (ref 65–99)
POTASSIUM: 4.3 mmol/L (ref 3.5–5.1)
SODIUM: 131 mmol/L — AB (ref 135–145)
TOTAL PROTEIN: 7.1 g/dL (ref 6.5–8.1)
Total Bilirubin: 0.4 mg/dL (ref 0.3–1.2)

## 2016-07-06 LAB — TSH: TSH: 1.887 u[IU]/mL (ref 0.350–4.500)

## 2016-07-06 MED ORDER — VARENICLINE TARTRATE 0.5 MG X 11 & 1 MG X 42 PO MISC: ORAL | 0 refills | Status: DC

## 2016-07-06 MED ORDER — SODIUM CHLORIDE 0.9% FLUSH
10.0000 mL | INTRAVENOUS | Status: DC | PRN
  Administered 2016-07-06: 10 mL via INTRAVENOUS
  Filled 2016-07-06: qty 10

## 2016-07-06 MED ORDER — HEPARIN SOD (PORK) LOCK FLUSH 100 UNIT/ML IV SOLN
500.0000 [IU] | Freq: Once | INTRAVENOUS | Status: AC
  Administered 2016-07-06: 500 [IU] via INTRAVENOUS
  Filled 2016-07-06: qty 5

## 2016-07-06 MED ORDER — ESCITALOPRAM OXALATE 20 MG PO TABS: 20.0000 mg | ORAL_TABLET | Freq: Every day | ORAL | 3 refills | Status: DC

## 2016-07-06 NOTE — Progress Notes (Addendum)
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 Isidore Moos.      08/21/2015 - 09/10/2015 Chemotherapy    Cisplatin every 21 days with XRT.      08/28/2015 Adverse Reaction    Tinnitis, Cisplatin-induced.      09/11/2015 - 09/30/2015 Chemotherapy    Carboplatin/5FU      09/20/2015 - 09/21/2015 Hospital Admission    Intractable nausea/vomiting. Severe Mucositis      10/17/2015 - 10/20/2015 Hospital Admission    Esophageal/oral candidasis. Shingles. Fever. Chemotherapy induced pancytopenia      01/24/2016 PET scan    Resolution of the left tonsillar mass and left neck adenopathy. No findings for residual or locally recurrent tumor, adenopathy or distant metastatic disease.      03/28/2016 - 04/08/2016 Hospital Admission    Admitted with severe abdominal pain. Found to diffuse peritonitis from perforated sigmoid colon requiring colostomy. Discharged to [facility] in [place].             INTERVAL HISTORY:  Frank Castillo presents to clinic today for follow-up for history of Stage IVA left tonsil cancer s/p recent colostomy d/t diffuse peritonitis from perforated sigmoid colon in 03/2016.   He underwent colonoscopy via ostomy and per rectum with Dr. Gala Romney on 07/02/16; procedure note reviewed and results showed residual colonic diverticulosis; stomach and esophageal biopsies benign without dysplasia or malignancy.  He tells me he had esophageal dilation at that time as well.  Patient was given a prescription for Dexilant, but it was  approved by his insurance. Therefore, he started taking Protonix, which has helped control his symptoms.  He was told to take 7 days of Carafate, but it was going to cost too much money for him to fill.  Recommended he contact GI for alternative. He has a follow-up visit with Dr. Marcello Moores with GI surgery later today.    He is feeling much better than previous. He is back home and doing well.  He is able to tolerate a wide variety of foods; he has lost about 8 lbs in the past month.  Continues to struggle with xerostomia. He is using the Jevity tube feedings about 2-8 cans/day.   His energy levels are improving and he feels like he is getting stronger.  One of his biggest concerns is smoking cessation. He is "smoking more now than I ever have."  Currently smoking 1.5 packs per day.  He and his sister are working on quitting smoking together for her upcoming birthday present; they are wanting to support one another through their stopping smoking efforts as a family.       ADDITIONAL REVIEW OF SYSTEMS:  Review of Systems  Constitutional: Positive for weight loss. Negative for chills and fever.  HENT: Negative.  Negative for sore throat.   Eyes: Negative.   Respiratory: Negative.  Negative for cough.   Cardiovascular: Negative.   Gastrointestinal: Negative for abdominal pain, blood in stool, melena,  nausea and vomiting.       Managing colostomy well.   Genitourinary: Negative.  Negative for dysuria and hematuria.  Musculoskeletal: Negative.   Skin: Negative.   Neurological: Negative.   Endo/Heme/Allergies: Negative.   Psychiatric/Behavioral: Negative.    Additional ROS per HPI.    PAST MEDICAL & SURGICAL HISTORY:  Past Medical History:  Diagnosis Date  . Cirrhosis ([place])    Followed at [facility]  . Hypertension   . Iron deficiency anemia 05/07/2016  . Portal hypertension ([place])   . Tonsillar mass    left   Past Surgical History:  Procedure Laterality Date  . HERNIA REPAIR Right   .  LAPAROTOMY N/A 03/28/2016   Procedure: EXPLORATORY LAPAROTOMY, SIGMOID COLECTOMY, COLOSTOMY;  Surgeon: Leighton Ruff, MD;  Location: [facility];  Service: General;  Laterality: N/A;  . MOUTH SURGERY    . TONSILLECTOMY Left 07/09/2015   Procedure: LEFT TONSILLECTOMY;  Surgeon: Leta Baptist, MD;  Location: [facility];  Service: ENT;  Laterality: Left;      CURRENT MEDICATIONS:  Current Outpatient Prescriptions on File Prior to Visit  Medication Sig Dispense Refill  . cetirizine (ZYRTEC) 10 MG tablet Take 10 mg by mouth at bedtime.     . cholecalciferol (VITAMIN D) 1000 units tablet Take 1,000 Units by mouth daily.    . diphenhydrAMINE (BENADRYL) 50 MG tablet Take 50 mg by mouth at bedtime as needed for sleep.    Marland Kitchen ibuprofen (ADVIL,MOTRIN) 200 MG tablet Take 600 mg by mouth every 6 (six) hours as needed for headache or moderate pain.    . polyethylene glycol-electrolytes (TRILYTE) 420 g solution Take 4,000 mLs by mouth as directed. 4000 mL 0  . sodium fluoride (FLUORISHIELD) 1.1 % GEL dental gel Instill one drop of gel per tooth space of fluoride tray. Place over teeth for 5 minutes. Remove. Spit out excess. Repeat nightly. 125 mL 2  . vitamin B-12 (CYANOCOBALAMIN) 1000 MCG tablet Take 1,000 mcg by mouth daily.    . Vitamins-Lipotropics (LIPOFLAVONOID) TABS Take 2 tablets by mouth 3 (three) times daily.     Current Facility-Administered Medications on File Prior to Visit  Medication Dose Route Frequency Provider Last Rate Last Dose  . 0.9 %  sodium chloride infusion   Intravenous Continuous Manon Hilding Kefalas, PA-C      . 0.9 %  sodium chloride infusion   Intravenous Continuous Thomas S Kefalas, PA-C      . 0.9 %  sodium chloride infusion   Intravenous Continuous Shannon K Penland, MD      . 0.9 %  sodium chloride infusion   Intravenous Continuous Kelby Fam Penland, MD      . 0.9 %  sodium chloride infusion   Intravenous Continuous Patrici Ranks, MD        ALLERGIES: Allergies    Allergen Reactions  . Bee Venom Swelling  . Other     Opthalmic mycin irritates eyes.     PHYSICAL EXAM:   Vitals:  Vitals:   07/06/16 0914  BP: (!) 131/45  Pulse: 72  Resp: 18  Temp: 97.7 F (36.5 C)    Filed Weights   07/06/16 0914  Weight: 150 lb 3.2 oz (68.1 kg)    General: Thin male in no acute distress.  Unaccompanied today.    HEENT: Head is normocephalic.  Pupils equal and reactive to light. Conjunctivae clear without exudate.  Sclerae anicteric. Oral mucosa is pink and dry. Oropharynx is mildly erythematous; no evident mass.  Lymph: No cervical, supraclavicular, or infraclavicular lymphadenopathy noted on palpation.  Neck: No palpable masses. Scant anterior neck lymphedema.   Cardiovascular: Regular rate & regular rhythm.  Respiratory: Clear to auscultation bilaterally. Chest expansion symmetric without accessory muscle use. Breathing non-labored.    GU: Deferred.   GI: Soft, non-tender abdomen. Normoactive bowel sounds. G-tube in place; dressing clean/dry/intact.   Left colostomy intact. Neuro: No focal deficits. Steady gait.   Psych: Normal mood and affect for situation. Extremities: No edema. Skin: Warm and dry.    LABORATORY DATA: CBC Latest Ref Rng & Units 07/06/2016 05/27/2016 05/06/2016  WBC 4.0 - 10.5 K/uL 6.1 4.9 8.3  Hemoglobin 13.0 - 17.0 g/dL 13.2 10.7(L) 7.0(L)  Hematocrit 39.0 - 52.0 % 39.8 33.8(L) 22.4(L)  Platelets 150 - 400 K/uL 271 250 569(H)   CMP Latest Ref Rng & Units 07/06/2016 05/27/2016 05/04/2016  Glucose 65 - 99 mg/dL 96 69 97  BUN 6 - 20 mg/dL 13 12 8   Creatinine 0.61 - 1.24 mg/dL 0.66 0.61 0.71  Sodium 135 - 145 mmol/L 131(L) 130(L) 130(L)  Potassium 3.5 - 5.1 mmol/L 4.3 3.9 4.6  Chloride 101 - 111 mmol/L 96(L) 96(L) 94(L)  CO2 22 - 32 mmol/L 26 29 27   Calcium 8.9 - 10.3 mg/dL 9.5 8.9 8.9  Total Protein 6.5 - 8.1 g/dL 7.1 6.9 7.2  Total Bilirubin 0.3 - 1.2 mg/dL 0.4 0.2(L) 0.4  Alkaline Phos 38 - 126 U/L 46 43 50  AST 15 - 41  U/L 13(L) 13(L) 11(L)  ALT 17 - 63 U/L 11(L) 9(L) 8(L)   Results for YANIS, SHAMBLEN (MRN [id number])   Ref. Range 07/06/2016 09:55  TSH Latest Ref Range: 0.350 - 4.500 uIU/mL 1.887     DIAGNOSTIC IMAGING:  Most recent: CT abd/pelvis-03/28/16      ASSESSMENT & PLAN:  [name] is a pleasant 66 y.o. 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  He completed treatment on 09/30/15.  PET scan on 01/24/16 should resolution of left tonsillar mass and left neck adenopathy.  Patient presents to cancer center for routine follow-up.    Stage IVA left tonsil cancer:  -Clinically, he is without evidence of disease recurrence based on physical exam/diagnostic imaging. No need for repeat imaging, unless clinically indicated.  -He understands his highest risk for recurrence is in the first 2 years after treatment.  We will continue to monitor him closely along with ENT and his Radiation Oncologist.  -Return to cancer center in 3 weeks for continued follow-up on smoking cessation efforts.  -Return to cancer center in 4 months after upcoming smoking cessation visit for cancer follow-up.   At risk for hypothyroidism: -H&N cancer patients treated with chemo and radiation are at increased risk of thyroid dysfunction.  -TSH normal today. He has no symptoms of hypothyroidism at present. We will continue to monitor every 6-12 months.   Protein-calorie malnutrition:  -Patient has gained about 8 lbs in past month. [name], RD continuing to follow patient.   -Encouraged him to push oral intake and monitor his weight closely. Supplement with Jevity as directed. He understands he will not be eligible to have his feeding tube removed  until his weight has stabilized on oral intake alone.   Tobacco use disorder:  -We discussed the  pathophysiology of nicotine dependence and different strategies to stop smoking.  The gold standard of tobacco cessation is nicotine replacement (with nicotine patches & gum/lozenges) or Varenicline (Chantix).  We discussed common side effects of each treatment.  He elected to give Chantix a try.  I prescribed a starter pack for him with the following instructions printed on the prescription:  -Take 0.5 mg tab po daily x 3 days -then, increase to 0.5 mg po BID x 4 days -then, increase to 1 mg po BID (target dose/frequency).    -Encouraged him to set some new "rules" that do not allow him to smoke in high trigger areas; instead he could use a piece of nicotine gum or a lozenge when he has a craving. I gave him instructions on how to use these nicotine replacement products.  FrankMarsteller  understands that data suggests that "cold [place]" is the least effective way to stop using tobacco products.  Having a clear "quit plan" is much more effective and requires a step-wise approach with continued support from a tobacco treatment specialist.  I encouraged him to reach out to me if he has further questions.  Greater than 15 minutes was spent in smoking cessation counseling with this patient.         Dispo:  -Return to cancer center for continued smoking cessation counseling in 3 weeks.    Mike Craze, NP [place] [phone]      (previous smartblock; unable to delete)  Physical Exam  Nursing note and vitals reviewed.

## 2016-07-06 NOTE — H&P (Signed)
History of Present Illness Leighton Ruff MD; [id number] 12:00 PM) The patient is a 66 year old male presenting for a post-operative visit. 66 year old male who underwent emergent Hartman's procedure for perforated rectosigmoid colon. His recovery in hospital was prolonged due to his pain control and ileus. He was discharged to a skilled nursing facility on postop day 11. Since that time he has been recovering well. He has stopped using his G-tube for feeding. He is tolerating a semi solid diet rather well. He is having bowel function. He is not having any trouble pouching his ostomy site. His surgical wound is healed.    Problem List/Past Medical Leighton Ruff, MD; [id number] 12:01 PM) POST-OPERATIVE STATE [phone])  Past Surgical History Leighton Ruff, MD; [id number] 12:01 PM) Open Inguinal Hernia Surgery Right. Tonsillectomy  Diagnostic Studies History Leighton Ruff, MD; [id number] 12:01 PM) Colonoscopy never  Allergies Mammie Lorenzo, LPN; [id number] AM) No Known Drug Allergies 04/27/2016  Medication History Leighton Ruff, MD; [id number] 12:01 PM) Myrna Blazer (420GM For Solution, Oral) Active. FentaNYL (12MCG/HR Patch 72HR, 1 (one) Patch Transdermal every three days, Taken starting 04/08/2016) Active. Ativan (0.5MG  Tablet, 1 (one) Tablet Oral every six hours, as needed, Taken starting 04/08/2016) Active. Ciprofloxacin HCl (250MG  Tablet, Oral) Active. OxyCODONE HCl (5MG /5ML Solution, Oral as needed) Active. ZyrTEC (10MG  Tablet, Oral daily) Active. Lexapro (10MG  Tablet, Oral daily) Active. Salagen (5MG  Tablet, Oral as needed) Active. Medications Reconciled Neomycin Sulfate (500MG  Tablet, 2 (two) Tablet Oral SEE NOTE, Taken starting 07/06/2016) Active. (TAKE TWO TABLETS AT 2 PM, 3 PM, AND 10 PM THE DAY PRIOR TO SURGERY) Flagyl (500MG  Tablet, 2 (two) Tablet Oral SEE NOTE, Taken starting 07/06/2016) Active. (Take at 2pm, 3pm, and 10pm the day prior to your colon  operation)  Social History Leighton Ruff, MD; [id number] 12:01 PM) Alcohol use Remotely quit alcohol use. Caffeine use Coffee. Illicit drug use Remotely quit drug use. Tobacco use Current every day smoker.  Family History Leighton Ruff, MD; [id number] 12:01 PM) Alcohol Abuse Father. Arthritis Mother.  Other Problems Leighton Ruff, MD; [id number] 12:01 PM) Alcohol Abuse Back Pain Cirrhosis Of Liver Inguinal Hernia Melanoma     Review of Systems Leighton Ruff MD; [id number] 12:01 PM) General Present- Appetite Loss, Fatigue, Night Sweats and Weight Loss. Not Present- Chills, Fever and Weight Gain. Skin Not Present- Change in Wart/Mole, Dryness, Hives, Jaundice, New Lesions, Non-Healing Wounds, Rash and Ulcer. HEENT Present- Ringing in the Ears and Seasonal Allergies. Not Present- Earache, Hearing Loss, Hoarseness, Nose Bleed, Oral Ulcers, Sinus Pain, Sore Throat, Visual Disturbances, Wears glasses/contact lenses and Yellow Eyes. Respiratory Not Present- Bloody sputum, Chronic Cough, Difficulty Breathing, Snoring and Wheezing. Cardiovascular Not Present- Chest Pain, Difficulty Breathing Lying Down, Leg Cramps, Palpitations, Rapid Heart Rate, Shortness of Breath and Swelling of Extremities. Gastrointestinal Present- Abdominal Pain, Bloating, Change in Bowel Habits, Constipation, Difficulty Swallowing and Nausea. Not Present- Bloody Stool, Chronic diarrhea, Excessive gas, Gets full quickly at meals, Hemorrhoids, Indigestion, Rectal Pain and Vomiting. Musculoskeletal Present- Back Pain and Muscle Weakness. Not Present- Joint Pain, Joint Stiffness, Muscle Pain and Swelling of Extremities.  Vitals Claiborne Billings Dockery LPN; [id number] AM) 07/06/2016 11:45 AM Weight: 149.8 lb Height: 70in Body Surface Area: 1.85 m Body Mass Index: 21.49 kg/m  Temp.: 31F(Oral)  Pulse: 76 (Regular)  BP: 122/70 (Sitting, Left Arm, Standard)      Physical Exam Leighton Ruff MD;  [id number] 12:02 PM)  General Mental Status-Alert. General Appearance-Not in acute distress. Build & Nutrition-Well nourished. Posture-Normal posture. Gait-Normal.  Head and Neck Head-normocephalic,  atraumatic with no lesions or palpable masses. Trachea-midline.  Chest and Lung Exam Chest and lung exam reveals -on auscultation, normal breath sounds, no adventitious sounds and normal vocal resonance.  Cardiovascular Cardiovascular examination reveals -normal heart sounds, regular rate and rhythm with no murmurs and no digital clubbing, cyanosis, edema, increased warmth or tenderness.  Abdomen Inspection Inspection of the abdomen reveals - No Hernias. Skin - Scar - Periumbilical(Well-healed, stellate appearing scar). Colostomy - Left Lower Quadrant. Palpation/Percussion Palpation and Percussion of the abdomen reveal - Soft, Non Tender, No Rigidity (guarding), No hepatosplenomegaly and No Palpable abdominal masses.  Neurologic Neurologic evaluation reveals -alert and oriented x 3 with no impairment of recent or remote memory, normal attention span and ability to concentrate, normal sensation and normal coordination.  Musculoskeletal Normal Exam - Bilateral-Upper Extremity Strength Normal and Lower Extremity Strength Normal.    Assessment & Plan Leighton Ruff MD; [id number] 12:04 PM)  POST-OPERATIVE STATE [phone]) Impression: Patient is status post Hartman's procedure for perforated rectosigmoid colon. 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 He is tolerating a diet and is not using his G-tube. He is off all narcotics. He is not losing weight. I think this is a good time to reverse his ostomy. I think this can be done laparoscopically. He has some skin puckering due to his wound healing. I recommended that we revise his scar during surgery. If he is not needing his G-tube any further, we can also remove this during surgery. The  surgery and anatomy were described to the patient as well as the risks of surgery and the possible complications. These include: Bleeding, deep abdominal infections and possible wound complications such as hernia and infection, damage to adjacent structures, leak of surgical connections, which can lead to other surgeries and possibly an ostomy, possible need for other procedures, such as abscess drains in radiology, possible prolonged hospital stay, possible diarrhea from removal of part of the colon, possible constipation from narcotics, possible bowel, bladder or sexual dysfunction if having rectal surgery, prolonged fatigue/weakness or appetite loss, possible early recurrence of of disease, possible complications of their medical problems such as heart disease or arrhythmias or lung problems, death (less than 1%). I believe the patient understands and wishes to proceed with the surgery.

## 2016-07-06 NOTE — Patient Instructions (Addendum)
[place] at [facility] Discharge Instructions  RECOMMENDATIONS MADE BY THE CONSULTANT AND ANY TEST RESULTS WILL BE SENT TO YOUR REFERRING PHYSICIAN.  Exam with Robynn Pane, PA. Start taking your Chantix as soon as you get it filled. We will see you in three weeks to follow up on your smoking.   Please see Angie as you leave for your appointments.      Thank you for choosing [place] at [facility] to provide your oncology and hematology care.  To afford each patient quality time with our provider, please arrive at least 15 minutes before your scheduled appointment time.    If you have a lab appointment with the [facility] please come in thru the  Main Entrance and check in at the main information desk  You need to re-schedule your appointment should you arrive 10 or more minutes late.  We strive to give you quality time with our providers, and arriving late affects you and other patients whose appointments are after yours.  Also, if you no show three or more times for appointments you may be dismissed from the clinic at the providers discretion.     Again, thank you for choosing [facility].  Our hope is that these requests will decrease the amount of time that you wait before being seen by our physicians.       _____________________________________________________________  Should you have questions after your visit to [facility], please contact our office at [phone] between the hours of 8:30 a.m. and 4:30 p.m.  Voicemails left after 4:30 p.m. will not be returned until the following business day.  For prescription refill requests, have your pharmacy contact our office.       Resources For Cancer Patients and their Caregivers ? American Cancer Society: Can assist with transportation, wigs, general needs, runs Look Good Feel Better.        820-073-7247 ? Cancer Care: Provides financial  assistance, online support groups, medication/co-pay assistance.  1-800-813-HOPE 825 184 1115) ? [place] Assists [place] cancer patients and their families through emotional , educational and financial support.  501-771-1416 ? [place] DSS Where to apply for food stamps, Medicaid and utility assistance. (980)516-7199 ? [facility]: Transportation to medical appointments. (203) 203-7304 ? Social Security Administration: May apply for disability if have a Stage IV cancer. 747-155-5947 828-634-6841 ? [employer], Disability and Transit Services: Assists with nutrition, care and transit needs. [place] Support Programs: @10RELATIVEDAYS @ > Cancer Support Group  2nd Tuesday of the month 1pm-2pm, Journey Room  > Creative Journey  3rd Tuesday of the month 1130am-1pm, Journey Room  > Look Good Feel Better  1st Wednesday of the month 10am-12 noon, Journey Room (Call [place] to register [phone])

## 2016-07-06 NOTE — Patient Instructions (Signed)
[facility] at [facility] Discharge Instructions  RECOMMENDATIONS MADE BY THE CONSULTANT AND ANY TEST RESULTS WILL BE SENT TO YOUR REFERRING PHYSICIAN.  Port flush done with labs Follow up as scheduled.  Thank you for choosing [facility] at [facility] to provide your oncology and hematology care.  To afford each patient quality time with our provider, please arrive at least 15 minutes before your scheduled appointment time.    If you have a lab appointment with the Cancer Center please come in thru the  Main Entrance and check in at the main information desk  You need to re-schedule your appointment should you arrive 10 or more minutes late.  We strive to give you quality time with our providers, and arriving late affects you and other patients whose appointments are after yours.  Also, if you no show three or more times for appointments you may be dismissed from the clinic at the providers discretion.     Again, thank you for choosing [facility].  Our hope is that these requests will decrease the amount of time that you wait before being seen by our physicians.       _____________________________________________________________  Should you have questions after your visit to [facility], please contact our office at [phone] between the hours of 8:30 a.m. and 4:30 p.m.  Voicemails left after 4:30 p.m. will not be returned until the following business day.  For prescription refill requests, have your pharmacy contact our office.       Resources For Cancer Patients and their Caregivers ? American Cancer Society: Can assist with transportation, wigs, general needs, runs Look Good Feel Better.        1-888-227-6333 ? Cancer Care: Provides financial assistance, online support groups, medication/co-pay assistance.  1-800-813-HOPE (4673) ? [facility] Assists [place] cancer patients  and their families through emotional , educational and financial support.  336-427-4357 ? [place] DSS Where to apply for food stamps, Medicaid and utility assistance. 336-342-1394 ? [facility]: Transportation to medical appointments. 336-347-2287 ? Social Security Administration: May apply for disability if have a Stage IV cancer. 336-342-7796 1-800-772-1213 ? [place] [facility], Disability and Transit Services: Assists with nutrition, care and transit needs. 336-349-2343  Cancer Center Support Programs: @10RELATIVEDAYS@ > Cancer Support Group  2nd Tuesday of the month 1pm-2pm, Journey Room  > Creative Journey  3rd Tuesday of the month 1130am-1pm, Journey Room  > Look Good Feel Better  1st Wednesday of the month 10am-12 noon, Journey Room (Call American Cancer Society to register 1-800-395-5775)   

## 2016-07-06 NOTE — Progress Notes (Signed)
Frank Castillo presented for Portacath access and flush. Portacath located right chest wall accessed with  H 20 needle. Good blood return present. Portacath flushed with 31ml NS and 500U/61ml Heparin and needle removed intact. Procedure without incident. Patient tolerated procedure well. Labs drawn

## 2016-07-08 ENCOUNTER — Encounter (HOSPITAL_COMMUNITY): Payer: Self-pay | Admitting: Internal Medicine

## 2016-07-14 ENCOUNTER — Encounter (HOSPITAL_COMMUNITY): Payer: Self-pay

## 2016-07-21 ENCOUNTER — Encounter (HOSPITAL_COMMUNITY): Payer: Self-pay | Admitting: Adult Health

## 2016-07-21 ENCOUNTER — Other Ambulatory Visit: Payer: Self-pay | Admitting: General Surgery

## 2016-07-21 NOTE — Progress Notes (Signed)
Spoke with Claiborne Billings from [facility], calling on behalf of Dr. Leighton Ruff.  They were asking if we felt it was clinically appropriate for Frank Castillo to have his feeding tube removed at the time of his colostomy reversal surgery.   I spoke with dietitian, [name], RD who thinks removing the tube would be fine for this patient. His weight has stabilized and he is eating quite a bit of solid foods, which is continuing to improve.    Notified Kelly via phone that our recommendations would be to remove the G-tube. Encouraged them to contact us if they needed any additional information.    Mike Craze, NP [place] [phone]

## 2016-07-27 ENCOUNTER — Encounter (HOSPITAL_COMMUNITY): Payer: Self-pay | Admitting: Adult Health

## 2016-07-27 ENCOUNTER — Encounter (HOSPITAL_COMMUNITY): Payer: BLUE CROSS/BLUE SHIELD | Attending: Hematology & Oncology | Admitting: Adult Health

## 2016-07-27 VITALS — BP 144/71 | HR 72 | Temp 98.0°F | Resp 18 | Wt 153.6 lb

## 2016-07-27 DIAGNOSIS — F17200 Nicotine dependence, unspecified, uncomplicated: Secondary | ICD-10-CM

## 2016-07-27 DIAGNOSIS — C090 Malignant neoplasm of tonsillar fossa: Secondary | ICD-10-CM | POA: Diagnosis not present

## 2016-07-27 DIAGNOSIS — B370 Candidal stomatitis: Secondary | ICD-10-CM | POA: Insufficient documentation

## 2016-07-27 DIAGNOSIS — L298 Other pruritus: Secondary | ICD-10-CM | POA: Diagnosis not present

## 2016-07-27 DIAGNOSIS — E46 Unspecified protein-calorie malnutrition: Secondary | ICD-10-CM | POA: Diagnosis not present

## 2016-07-27 DIAGNOSIS — Z720 Tobacco use: Secondary | ICD-10-CM

## 2016-07-27 DIAGNOSIS — L739 Follicular disorder, unspecified: Secondary | ICD-10-CM | POA: Insufficient documentation

## 2016-07-27 DIAGNOSIS — Z716 Tobacco abuse counseling: Secondary | ICD-10-CM

## 2016-07-27 DIAGNOSIS — K219 Gastro-esophageal reflux disease without esophagitis: Secondary | ICD-10-CM | POA: Diagnosis not present

## 2016-07-27 DIAGNOSIS — Z95828 Presence of other vascular implants and grafts: Secondary | ICD-10-CM | POA: Insufficient documentation

## 2016-07-27 DIAGNOSIS — R05 Cough: Secondary | ICD-10-CM | POA: Insufficient documentation

## 2016-07-27 DIAGNOSIS — R112 Nausea with vomiting, unspecified: Secondary | ICD-10-CM | POA: Insufficient documentation

## 2016-07-27 DIAGNOSIS — C099 Malignant neoplasm of tonsil, unspecified: Secondary | ICD-10-CM | POA: Insufficient documentation

## 2016-07-27 MED ORDER — CLOTRIMAZOLE-BETAMETHASONE 1-0.05 % EX CREA: 1.0000 "application " | TOPICAL_CREAM | Freq: Two times a day (BID) | CUTANEOUS | 0 refills | Status: DC | PRN

## 2016-07-27 MED ORDER — VARENICLINE TARTRATE 1 MG PO TABS: 1.0000 mg | ORAL_TABLET | Freq: Two times a day (BID) | ORAL | 4 refills | Status: DC

## 2016-07-27 MED ORDER — PANTOPRAZOLE SODIUM 40 MG PO TBEC: 40.0000 mg | DELAYED_RELEASE_TABLET | Freq: Every day | ORAL | 3 refills | Status: DC

## 2016-07-27 NOTE — Patient Instructions (Addendum)
[place] at [facility] Discharge Instructions  RECOMMENDATIONS MADE BY THE CONSULTANT AND ANY TEST RESULTS WILL BE SENT TO YOUR REFERRING PHYSICIAN.  You were seen today by Mike Craze NP. You were given refills on Chantix and Protonix. Rx sent to your pharmacy for Clomitrazole cream. Return in 4 months for labs and follow up.    Thank you for choosing [place] at [facility] to provide your oncology and hematology care.  To afford each patient quality time with our provider, please arrive at least 15 minutes before your scheduled appointment time.    If you have a lab appointment with the [facility] please come in thru the  Main Entrance and check in at the main information desk  You need to re-schedule your appointment should you arrive 10 or more minutes late.  We strive to give you quality time with our providers, and arriving late affects you and other patients whose appointments are after yours.  Also, if you no show three or more times for appointments you may be dismissed from the clinic at the providers discretion.     Again, thank you for choosing [facility].  Our hope is that these requests will decrease the amount of time that you wait before being seen by our physicians.       _____________________________________________________________  Should you have questions after your visit to [facility], please contact our office at [phone] between the hours of 8:30 a.m. and 4:30 p.m.  Voicemails left after 4:30 p.m. will not be returned until the following business day.  For prescription refill requests, have your pharmacy contact our office.       Resources For Cancer Patients and their Caregivers ? American Cancer Society: Can assist with transportation, wigs, general needs, runs Look Good Feel Better.        314 305 3426 ? Cancer Care: Provides financial assistance, online support  groups, medication/co-pay assistance.  1-800-813-HOPE 580 306 1133) ? [place] Assists [place] cancer patients and their families through emotional , educational and financial support.  712-617-0773 ? [place] DSS Where to apply for food stamps, Medicaid and utility assistance. (432)396-8051 ? [facility]: Transportation to medical appointments. (781) 192-3947 ? Social Security Administration: May apply for disability if have a Stage IV cancer. 856-204-9260 661-414-2104 ? [employer], Disability and Transit Services: Assists with nutrition, care and transit needs. [place] Support Programs: @10RELATIVEDAYS @ > Cancer Support Group  2nd Tuesday of the month 1pm-2pm, Journey Room  > Creative Journey  3rd Tuesday of the month 1130am-1pm, Journey Room  > Look Good Feel Better  1st Wednesday of the month 10am-12 noon, Journey Room (Call [place] to register [phone])

## 2016-07-27 NOTE — Progress Notes (Signed)
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 Isidore Moos.      08/21/2015 - 09/10/2015 Chemotherapy    Cisplatin every 21 days with XRT.      08/28/2015 Adverse Reaction    Tinnitis, Cisplatin-induced.      09/11/2015 - 09/30/2015 Chemotherapy    Carboplatin/5FU      09/20/2015 - 09/21/2015 Hospital Admission    Intractable nausea/vomiting. Severe Mucositis      10/17/2015 - 10/20/2015 Hospital Admission    Esophageal/oral candidasis. Shingles. Fever. Chemotherapy induced pancytopenia      01/24/2016 PET scan    Resolution of the left tonsillar mass and left neck adenopathy. No findings for residual or locally recurrent tumor, adenopathy or distant metastatic disease.      03/28/2016 - 04/08/2016 Hospital Admission    Admitted with severe abdominal pain. Found to diffuse peritonitis from perforated sigmoid colon requiring colostomy. Discharged to [facility] in [place].             INTERVAL HISTORY:  Mr. Sartini presents to clinic today for follow-up for history of Stage IVA left tonsil cancer s/p recent colostomy d/t diffuse peritonitis from perforated sigmoid colon in 03/2016. He is also actively working on smoking cessation, currently on Chantix therapy.    His appetite and energy levels are continuing to improve.  He has not required any Jevity tube feedings in the past month; his weight is up about 3 lbs since his visit 3 weeks ago, which is great.  Xerostomia is continuing to improve.  He is back to work part-time  (about 12 hours/week), which he is really enjoying.    He is anticipating his upcoming surgery with Dr. Leighton Ruff for colostomy reversal and G-tube removal.  He has a pruritic rash to his abdomen at the G-tube site that he would like me to take a look at today.    He is tolerating Chantix well; denies nausea, vivid dreams, or changes in mental status. His family remains supportive of his smoking cessation efforts.  He is down to smoking about 1 ppd (down from 1.5 ppd about 1 month ago).     ADDITIONAL REVIEW OF SYSTEMS:  Review of Systems  Constitutional: Positive for malaise/fatigue (Energy levels are improving. ). Negative for chills, fever and weight loss.  HENT: Negative.  Negative for sore throat.   Eyes: Negative.   Respiratory: Negative.  Negative for cough.   Cardiovascular: Negative.  Negative for chest pain and palpitations.  Gastrointestinal: Negative for abdominal pain, blood in stool, constipation, diarrhea, melena, nausea and vomiting.       Managing colostomy well.   Genitourinary: Negative.  Negative for dysuria and hematuria.  Musculoskeletal: Negative.   Skin: Positive for itching (At G-tube site ) and rash (At G-tube site).  Neurological: Negative.   Endo/Heme/Allergies: Negative.   Psychiatric/Behavioral: Negative.  Negative for depression. The patient is not nervous/anxious.    Additional ROS per HPI.  PAST MEDICAL & SURGICAL HISTORY:  Past Medical History:  Diagnosis Date  . Cirrhosis ([place])    Followed at [facility]  . Hypertension   . Iron deficiency anemia 05/07/2016  . Portal hypertension ([place])   . Tonsillar mass    left   Past Surgical History:  Procedure Laterality Date  . BIOPSY  07/02/2016   Procedure: BIOPSY;  Surgeon: Daneil Dolin, MD;  [facility];  Service: Endoscopy;;  esophageal  . COLONOSCOPY WITH PROPOFOL N/A 07/02/2016   Procedure: COLONOSCOPY WITH PROPOFOL;  Surgeon: Daneil Dolin, MD;  [facility];  Service:  Endoscopy;  Laterality: N/A;  9:45am  . ESOPHAGOGASTRODUODENOSCOPY (EGD) WITH PROPOFOL N/A 07/02/2016   Procedure: ESOPHAGOGASTRODUODENOSCOPY (EGD) WITH PROPOFOL;  Surgeon: Daneil Dolin, MD;  [facility];  Service: Endoscopy;  Laterality: N/A;  . HERNIA REPAIR Right   . LAPAROTOMY N/A 03/28/2016   Procedure: EXPLORATORY LAPAROTOMY, SIGMOID COLECTOMY, COLOSTOMY;  Surgeon: Leighton Ruff, MD;  Location: [facility];  Service: General;  Laterality: N/A;  [name] DILATION N/A 07/02/2016   Procedure: Keturah Shavers;  Surgeon: Daneil Dolin, MD;  [facility];  Service: Endoscopy;  Laterality: N/A;  . MOUTH SURGERY    . TONSILLECTOMY Left 07/09/2015   Procedure: LEFT TONSILLECTOMY;  Surgeon: Leta Baptist, MD;  Location: [facility];  Service: ENT;  Laterality: Left;      CURRENT MEDICATIONS:  Current Outpatient Prescriptions on File Prior to Visit  Medication Sig Dispense Refill  . cetirizine (ZYRTEC) 10 MG tablet Take 10 mg by mouth at bedtime.     . cholecalciferol (VITAMIN D) 1000 units tablet Take 1,000 Units by mouth daily.    . diphenhydrAMINE (BENADRYL) 50 MG tablet Take 50 mg by mouth at bedtime as needed for sleep.    Marland Kitchen escitalopram (LEXAPRO) 20 MG tablet Take 1 tablet (20 mg total) by mouth daily. 30 tablet 3  . ibuprofen (ADVIL,MOTRIN) 200 MG tablet Take 600 mg by mouth every 6 (six) hours as needed for headache or moderate pain.    . polyethylene glycol-electrolytes (TRILYTE) 420 g solution Take 4,000 mLs by mouth as directed. 4000 mL 0  . sodium fluoride (FLUORISHIELD) 1.1 % GEL dental gel Instill one drop of gel per tooth space of fluoride tray. Place over teeth for 5 minutes. Remove. Spit out excess. Repeat nightly. 125 mL 2  . vitamin B-12 (CYANOCOBALAMIN) 1000 MCG tablet Take 1,000 mcg by mouth daily.    . Vitamins-Lipotropics (LIPOFLAVONOID) TABS Take 2 tablets by mouth 3 (three) times daily.     Current Facility-Administered Medications on File  Prior to Visit  Medication Dose Route Frequency Provider Last Rate Last Dose  . 0.9 %  sodium chloride infusion   Intravenous Continuous Manon Hilding Kefalas, PA-C      . 0.9 %  sodium chloride infusion   Intravenous Continuous Thomas S Kefalas, PA-C      . 0.9 %  sodium chloride infusion   Intravenous Continuous Shannon K Penland, MD      . 0.9 %  sodium chloride infusion   Intravenous Continuous Kelby Fam Penland, MD      . 0.9 %  sodium chloride infusion   Intravenous Continuous Patrici Ranks, MD        ALLERGIES: Allergies  Allergen Reactions  . Bee Venom Swelling  . Other     Opthalmic mycin irritates eyes.     PHYSICAL EXAM:   Vitals:  Vitals:  07/27/16 1036  BP: (!) 144/71  Pulse: 72  Resp: 18  Temp: 98 F (36.7 C)    Filed Weights   07/27/16 1036  Weight: 153 lb 9.6 oz (69.7 kg)    General: Thin male in no acute distress.  Unaccompanied today.    HEENT: Head is normocephalic.  Pupils equal and reactive to light. Conjunctivae clear without exudate.  Sclerae anicteric. Oral mucosa is pink and dry. Oropharynx with mild erythema (improved from previous).  Lymph: No cervical, supraclavicular, or infraclavicular lymphadenopathy noted on palpation.  Neck: No palpable masses. Scant anterior neck lymphedema.   Cardiovascular: Regular rate & regular rhythm.  Respiratory: Clear to auscultation bilaterally. Chest expansion symmetric without accessory muscle use. Breathing non-labored.    GU: Deferred.   GI: Soft, non-tender abdomen. Normoactive bowel sounds. G-tube in place; dressing clean/dry/intact.  Erythematous rash beneath G-tube dressing (appears to be fungal).  Left colostomy intact. Neuro: No focal deficits. Steady gait.   Psych: Normal mood and affect for situation. Extremities: No edema. Skin: Warm and dry.    LABORATORY DATA: CBC Latest Ref Rng & Units 07/06/2016 05/27/2016 05/06/2016  WBC 4.0 - 10.5 K/uL 6.1 4.9 8.3  Hemoglobin 13.0 - 17.0 g/dL 13.2 10.7(L)  7.0(L)  Hematocrit 39.0 - 52.0 % 39.8 33.8(L) 22.4(L)  Platelets 150 - 400 K/uL 271 250 569(H)   CMP Latest Ref Rng & Units 07/06/2016 05/27/2016 05/04/2016  Glucose 65 - 99 mg/dL 96 69 97  BUN 6 - 20 mg/dL 13 12 8   Creatinine 0.61 - 1.24 mg/dL 0.66 0.61 0.71  Sodium 135 - 145 mmol/L 131(L) 130(L) 130(L)  Potassium 3.5 - 5.1 mmol/L 4.3 3.9 4.6  Chloride 101 - 111 mmol/L 96(L) 96(L) 94(L)  CO2 22 - 32 mmol/L 26 29 27   Calcium 8.9 - 10.3 mg/dL 9.5 8.9 8.9  Total Protein 6.5 - 8.1 g/dL 7.1 6.9 7.2  Total Bilirubin 0.3 - 1.2 mg/dL 0.4 0.2(L) 0.4  Alkaline Phos 38 - 126 U/L 46 43 50  AST 15 - 41 U/L 13(L) 13(L) 11(L)  ALT 17 - 63 U/L 11(L) 9(L) 8(L)   Results for HATIM, KUECKER (MRN [id number])   Ref. Range 07/06/2016 09:55  TSH Latest Ref Range: 0.350 - 4.500 uIU/mL 1.887     DIAGNOSTIC IMAGING:  Restaging PET scan: 01/24/16    Most recent: CT abd/pelvis-03/28/16      ASSESSMENT & PLAN:  Mr. Milder is a pleasant 66 y.o. 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  He completed treatment on 09/30/15.  PET scan on 01/24/16 showed resolution of left tonsillar mass and left neck adenopathy.  Patient presents to cancer center for routine follow-up.    Stage IVA left tonsil cancer:  -Clinically, he is without evidence of disease recurrence based on physical exam/diagnostic imaging. No need for repeat imaging, unless clinically indicated.  -Given that he is NED, there is no clinical need for his port-a-cath any longer. The patient is interested in having port removed.  I will send a message to Dr. Marcello Moores to see if his port-a-cath can be removed during his upcoming surgery. Otherwise, we can make arrangements for removal  with IR based on Dr. Marcello Moores' reply.  -Return to cancer center in 4 months for continued surveillance.   Protein-calorie malnutrition:  -  Continues to gain weight appropriately with oral intake alone, which is very encouraging.  His xerostomia is improving and he is eating a wide variety of solid foods now.  Previously discussed with [name], RD about the possibility of having G-tube removed with upcoming colostomy reversal surgery with Dr. Marcello Moores; RD agrees it is not unreasonable to remove G-tube given patient's clinical improvement and weight gain on oral intake alone.  Encouraged Mr. Janis to keep up the great work/efforts as it relates to his nutrition.     GERD:   -Protonix refilled per patient request.   Topical fungal infection:  -Mild erythematous rash to abdominal skin near G-tube site appears to be fungal, likely secondary to gauze dressing and normal diaphoresis.   -Prescription sent to pharmacy for topical Clotrimazole-Betamethasone cream to be applied PRN.   Tobacco use disorder:  -Tolerating Chantix 1 mg po BID very well with largely no reportable side effects. He has noticed a decrease in urges/cravings for cigarettes.  Previously, when he tried the short-acting nicotine replacement products (lozenges or gum), his oral mucosa was too sensitive s/p treatment to tolerate them well.  We discussed the idea of controlling his urges/cravings with short-acting nicotine products if he is able to tolerate them at this point. He agrees to try the lozenges again.  If unable to tolerate lozenges, then we could consider nicotine patch + Chantix to better manage nicotine dependence/cravings.   -Refill for Chantix sent to pharmacy; patient understands he will likely need a total of 3+ months of smoking cessation counseling/support.  We discussed different strategies to help manage upcoming stressors with his surgical procedures.  He has great support in his family and AA community.         Dispo:  -Return to cancer center in 4 months for continued follow-up.     Mike Craze, NP [place] [phone]      (previous smartblock; unable to delete)  Physical Exam  Nursing note and vitals reviewed.

## 2016-07-28 ENCOUNTER — Encounter: Payer: Self-pay | Admitting: Dietician

## 2016-07-28 NOTE — Progress Notes (Signed)
Got call from patient requesting more Ensure/Boost Coupons.   Called patient back. Got voicemail. Left message stating coupons would be mailed. Gave return number if would like to talk.    Wt Readings from Last 10 Encounters:  07/27/16 153 lb 9.6 oz (69.7 kg)  07/06/16 150 lb 3.2 oz (68.1 kg)  07/02/16 150 lb (68 kg)  06/29/16 150 lb 6.4 oz (68.2 kg)  06/04/16 142 lb 8 oz (64.6 kg)  05/04/16 140 lb 3.2 oz (63.6 kg)  04/14/16 137 lb (62.1 kg)  03/28/16 150 lb (68 kg)  02/27/16 152 lb 1.6 oz (69 kg)  02/03/16 153 lb 1.6 oz (69.4 kg)    Patient weight has improved. Per discussion with [facility] NP, patient OK to have PEG removed.    [name] RD, LDN, [place] [facility] Pager: [phone] 07/28/2016 9:43 AM

## 2016-08-04 ENCOUNTER — Telehealth: Payer: Self-pay

## 2016-08-04 NOTE — Telephone Encounter (Signed)
Trying to do a PA for dexilant. I need to know if pt has taken anything for reflux other than protonix. I tried to call, NA- LMOM

## 2016-08-05 ENCOUNTER — Telehealth: Payer: Self-pay | Admitting: Internal Medicine

## 2016-08-05 NOTE — Telephone Encounter (Signed)
He has not ever tried anything elias before

## 2016-08-05 NOTE — Telephone Encounter (Signed)
LMOM to call back

## 2016-08-05 NOTE — Telephone Encounter (Signed)
Please see other phone note

## 2016-08-05 NOTE — Telephone Encounter (Signed)
Pt was returning a call from [place]. I told him that JL would be back on Monday and I would let her know that he had called. [phone]

## 2016-08-10 NOTE — Telephone Encounter (Signed)
Pts insurance- [employer] [place]- will not pay for dexilant- nexium, lansoprazole, omeprazole and rabeprazole are on his formulary. Can we change him to something else?

## 2016-08-10 NOTE — Telephone Encounter (Signed)
Rabeprazole 20 mg daily; disp 30 with 11 refills if not previously tried or intolerant to med

## 2016-08-11 MED ORDER — RABEPRAZOLE SODIUM 20 MG PO TBEC
20.0000 mg | DELAYED_RELEASE_TABLET | Freq: Every day | ORAL | 11 refills | Status: DC
Start: 2016-08-11 — End: 2018-01-19

## 2016-08-11 NOTE — Telephone Encounter (Signed)
Tried to call pt- NA-LMOM- I need to know which pharmacy he wants rx sent to.

## 2016-08-11 NOTE — Telephone Encounter (Signed)
Pt is aware. rx has been sent to [facility].

## 2016-08-24 ENCOUNTER — Encounter (HOSPITAL_COMMUNITY): Payer: BLUE CROSS/BLUE SHIELD | Attending: Hematology & Oncology

## 2016-08-24 VITALS — BP 131/90 | HR 83 | Temp 98.0°F | Resp 20 | Wt 154.8 lb

## 2016-08-24 DIAGNOSIS — Z452 Encounter for adjustment and management of vascular access device: Secondary | ICD-10-CM

## 2016-08-24 DIAGNOSIS — C099 Malignant neoplasm of tonsil, unspecified: Secondary | ICD-10-CM | POA: Insufficient documentation

## 2016-08-24 DIAGNOSIS — Z95828 Presence of other vascular implants and grafts: Secondary | ICD-10-CM

## 2016-08-24 DIAGNOSIS — C090 Malignant neoplasm of tonsillar fossa: Secondary | ICD-10-CM | POA: Diagnosis not present

## 2016-08-24 DIAGNOSIS — B370 Candidal stomatitis: Secondary | ICD-10-CM | POA: Insufficient documentation

## 2016-08-24 DIAGNOSIS — L739 Follicular disorder, unspecified: Secondary | ICD-10-CM | POA: Insufficient documentation

## 2016-08-24 DIAGNOSIS — R05 Cough: Secondary | ICD-10-CM | POA: Insufficient documentation

## 2016-08-24 DIAGNOSIS — R112 Nausea with vomiting, unspecified: Secondary | ICD-10-CM | POA: Insufficient documentation

## 2016-08-24 MED ORDER — HEPARIN SOD (PORK) LOCK FLUSH 100 UNIT/ML IV SOLN
500.0000 [IU] | Freq: Once | INTRAVENOUS | Status: AC
  Administered 2016-08-24: 500 [IU] via INTRAVENOUS

## 2016-08-24 MED ORDER — SODIUM CHLORIDE 0.9% FLUSH: 10.0000 mL | INTRAVENOUS | Status: DC | PRN

## 2016-08-24 NOTE — Progress Notes (Signed)
Frank Castillo presented for Portacath access and flush. Portacath located rt chest wall accessed with  H 20 needle. No blood return. Portacath flushed with 2ml NS and 500U/40ml Heparin and needle removed intact. Procedure without incident. Patient tolerated procedure well.  Pt to hopefully get port and feeding tube out in next couple of weeks.

## 2016-08-26 NOTE — Patient Instructions (Addendum)
Frank Castillo  08/26/2016   Your procedure is scheduled on: 09-02-16  Report to [facility] Main  Entrance to the [facility]  3rd Floor to Short Stay at [place] AM.  Call this number if you have problems the morning of surgery [phone]   Remember: ONLY 1 PERSON MAY GO WITH YOU TO SHORT STAY TO GET  READY MORNING OF [place].  Do not eat food or drink liquids :After Midnight.             DRINK PLENTY OF CLEAR LIQUIDS ON THE DAY OF BOWEL PREP TO PREVENT DEHYDRATION    CLEAR LIQUID DIET   Foods Allowed                                                                     Foods Excluded  Coffee and tea, regular and decaf                             liquids that you cannot  Plain Jell-O in any flavor                                             see through such as: Fruit ices (not with fruit pulp)                                     milk, soups, orange juice  Iced Popsicles                                    All solid food Carbonated beverages, regular and diet                                    Cranberry, grape and apple juices Sports drinks like Gatorade Lightly seasoned clear broth or consume(fat free) Sugar, honey syrup  Sample Menu Breakfast                                Lunch                                     Supper Cranberry juice                    Beef broth                            Chicken broth Jell-O                                     Grape  juice                           Apple juice Coffee or tea                        Jell-O                                      Popsicle                                                Coffee or tea                        Coffee or tea  _____________________________________________________________________       Take these medicines the morning of surgery with A SIP OF WATER: Cetirizine (Zyrtec), Escitalopram (Lexapro),  Rabeprazole (Aciphex)                                You may not have any metal  on your body including hair pins and              piercings  Do not wear jewelry, make-up, lotions, powders or perfumes, deodorant                  Men may shave face and neck.   Do not bring valuables to the hospital. [place].  Contacts, dentures or bridgework may not be worn into surgery.  Leave suitcase in the car. After surgery it may be brought to your room.                  Please read over the following fact sheets you were given: _____________________________________________________________________             [facility] - Preparing for Surgery Before surgery, you can play an important role.  Because skin is not sterile, your skin needs to be as free of germs as possible.  You can reduce the number of germs on your skin by washing with CHG (chlorahexidine gluconate) soap before surgery.  CHG is an antiseptic cleaner which kills germs and bonds with the skin to continue killing germs even after washing. Please DO NOT use if you have an allergy to CHG or antibacterial soaps.  If your skin becomes reddened/irritated stop using the CHG and inform your nurse when you arrive at Short Stay. Do not shave (including legs and underarms) for at least 48 hours prior to the first CHG shower.  You may shave your face/neck. Please follow these instructions carefully:  1.  Shower with CHG Soap the night before surgery and the  morning of Surgery.  2.  If you choose to wash your hair, wash your hair first as usual with your  normal  shampoo.  3.  After you shampoo, rinse your hair and body thoroughly to remove the  shampoo.  4.  Use CHG as you would any other liquid soap.  You can apply chg directly  to the skin and wash                       Gently with a scrungie or clean washcloth.  5.  Apply the CHG Soap to your body ONLY FROM THE NECK DOWN.   Do not use on face/ open                           Wound or open sores.  Avoid contact with eyes, ears mouth and genitals (private parts).                       Wash face,  Genitals (private parts) with your normal soap.             6.  Wash thoroughly, paying special attention to the area where your surgery  will be performed.  7.  Thoroughly rinse your body with warm water from the neck down.  8.  DO NOT shower/wash with your normal soap after using and rinsing off  the CHG Soap.                9.  Pat yourself dry with a clean towel.            10.  Wear clean pajamas.            11.  Place clean sheets on your bed the night of your first shower and do not  sleep with pets. Day of Surgery : Do not apply any lotions/deodorants the morning of surgery.  Please wear clean clothes to the hospital/surgery center.  FAILURE TO FOLLOW THESE INSTRUCTIONS MAY RESULT IN THE CANCELLATION OF YOUR SURGERY PATIENT SIGNATURE_________________________________  NURSE SIGNATURE__________________________________  ________________________________________________________________________

## 2016-08-28 ENCOUNTER — Encounter (HOSPITAL_COMMUNITY)
Admission: RE | Admit: 2016-08-28 | Discharge: 2016-08-28 | Disposition: A | Discharge status: Home / Self Care | Payer: BLUE CROSS/BLUE SHIELD | Source: Ambulatory Visit | Attending: General Surgery | Admitting: General Surgery

## 2016-08-28 ENCOUNTER — Encounter (HOSPITAL_COMMUNITY): Payer: Self-pay

## 2016-08-28 DIAGNOSIS — Z01818 Encounter for other preprocedural examination: Secondary | ICD-10-CM | POA: Diagnosis present

## 2016-08-28 DIAGNOSIS — Z01812 Encounter for preprocedural laboratory examination: Secondary | ICD-10-CM | POA: Diagnosis not present

## 2016-08-28 LAB — COMPREHENSIVE METABOLIC PANEL
ALK PHOS: 48 U/L (ref 38–126)
ALT: 13 U/L — AB (ref 17–63)
AST: 17 U/L (ref 15–41)
Albumin: 4.3 g/dL (ref 3.5–5.0)
Anion gap: 5 (ref 5–15)
BUN: 16 mg/dL (ref 6–20)
CALCIUM: 9.5 mg/dL (ref 8.9–10.3)
CHLORIDE: 99 mmol/L — AB (ref 101–111)
CO2: 27 mmol/L (ref 22–32)
CREATININE: 0.78 mg/dL (ref 0.61–1.24)
Glucose, Bld: 85 mg/dL (ref 65–99)
Potassium: 4.8 mmol/L (ref 3.5–5.1)
SODIUM: 131 mmol/L — AB (ref 135–145)
Total Bilirubin: 0.6 mg/dL (ref 0.3–1.2)
Total Protein: 7 g/dL (ref 6.5–8.1)

## 2016-08-28 LAB — CBC
HCT: 39.5 % (ref 39.0–52.0)
HEMOGLOBIN: 13.2 g/dL (ref 13.0–17.0)
MCH: 29.3 pg (ref 26.0–34.0)
MCHC: 33.4 g/dL (ref 30.0–36.0)
MCV: 87.8 fL (ref 78.0–100.0)
PLATELETS: 214 10*3/uL (ref 150–400)
RBC: 4.5 MIL/uL (ref 4.22–5.81)
RDW: 14.6 % (ref 11.5–15.5)
WBC: 6.9 10*3/uL (ref 4.0–10.5)

## 2016-08-28 NOTE — Progress Notes (Signed)
EKG from 03/2016 and 2/17 shown to anesthesia.  No new orders given.  Patient denies any chest pain or shortness of breath.

## 2016-08-29 LAB — HEMOGLOBIN A1C
HEMOGLOBIN A1C: 5.5 % (ref 4.8–5.6)
Mean Plasma Glucose: 111 mg/dL

## 2016-08-31 NOTE — Progress Notes (Signed)
CXR- 10/17/2015- epic

## 2016-09-02 ENCOUNTER — Inpatient Hospital Stay (HOSPITAL_COMMUNITY)
Admission: RE | Admit: 2016-09-02 | Discharge: 2016-09-05 | DRG: 331 | Disposition: A | Discharge status: Home / Self Care | Payer: BLUE CROSS/BLUE SHIELD | Source: Ambulatory Visit | Attending: General Surgery | Admitting: General Surgery

## 2016-09-02 ENCOUNTER — Inpatient Hospital Stay (HOSPITAL_COMMUNITY): Payer: BLUE CROSS/BLUE SHIELD | Admitting: Certified Registered Nurse Anesthetist

## 2016-09-02 ENCOUNTER — Encounter (HOSPITAL_COMMUNITY)
Admission: RE | Disposition: A | Discharge status: Home / Self Care | Payer: Self-pay | Source: Ambulatory Visit | Attending: General Surgery

## 2016-09-02 ENCOUNTER — Encounter (HOSPITAL_COMMUNITY): Payer: Self-pay | Admitting: *Deleted

## 2016-09-02 DIAGNOSIS — Z433 Encounter for attention to colostomy: Principal | ICD-10-CM

## 2016-09-02 DIAGNOSIS — K660 Peritoneal adhesions (postprocedural) (postinfection): Secondary | ICD-10-CM | POA: Diagnosis present

## 2016-09-02 DIAGNOSIS — K439 Ventral hernia without obstruction or gangrene: Secondary | ICD-10-CM | POA: Diagnosis present

## 2016-09-02 DIAGNOSIS — I10 Essential (primary) hypertension: Secondary | ICD-10-CM | POA: Diagnosis present

## 2016-09-02 DIAGNOSIS — Z923 Personal history of irradiation: Secondary | ICD-10-CM

## 2016-09-02 DIAGNOSIS — Z933 Colostomy status: Secondary | ICD-10-CM

## 2016-09-02 DIAGNOSIS — C140 Malignant neoplasm of pharynx, unspecified: Secondary | ICD-10-CM | POA: Diagnosis present

## 2016-09-02 DIAGNOSIS — F17200 Nicotine dependence, unspecified, uncomplicated: Secondary | ICD-10-CM | POA: Diagnosis present

## 2016-09-02 HISTORY — PX: Z-PLASTY SCAR REVISION: SHX6192

## 2016-09-02 HISTORY — PX: REMOVAL OF GASTROSTOMY TUBE: SHX6058

## 2016-09-02 HISTORY — PX: COLOSTOMY TAKEDOWN: SHX5258

## 2016-09-02 HISTORY — PX: PORT-A-CATH REMOVAL: SHX5289

## 2016-09-02 HISTORY — PX: INCISIONAL HERNIA REPAIR: SHX193

## 2016-09-02 SURGERY — CLOSURE, COLOSTOMY, LAPAROSCOPIC
Anesthesia: General

## 2016-09-02 MED ORDER — ROCURONIUM BROMIDE 50 MG/5ML IV SOSY
PREFILLED_SYRINGE | INTRAVENOUS | Status: AC
  Filled 2016-09-02: qty 5

## 2016-09-02 MED ORDER — ROCURONIUM BROMIDE 100 MG/10ML IV SOLN
INTRAVENOUS | Status: DC | PRN
  Administered 2016-09-02: 10 mg via INTRAVENOUS
  Administered 2016-09-02: 50 mg via INTRAVENOUS
  Administered 2016-09-02: 20 mg via INTRAVENOUS
  Administered 2016-09-02: 30 mg via INTRAVENOUS

## 2016-09-02 MED ORDER — DIPHENHYDRAMINE HCL 25 MG PO TABS
50.0000 mg | ORAL_TABLET | Freq: Every evening | ORAL | Status: DC | PRN
  Filled 2016-09-02: qty 2

## 2016-09-02 MED ORDER — FENTANYL CITRATE (PF) 100 MCG/2ML IJ SOLN
INTRAMUSCULAR | Status: DC | PRN
  Administered 2016-09-02 (×2): 50 ug via INTRAVENOUS
  Administered 2016-09-02: 100 ug via INTRAVENOUS
  Administered 2016-09-02: 50 ug via INTRAVENOUS
  Administered 2016-09-02: 100 ug via INTRAVENOUS
  Administered 2016-09-02 (×2): 50 ug via INTRAVENOUS

## 2016-09-02 MED ORDER — LIDOCAINE 2% (20 MG/ML) 5 ML SYRINGE
INTRAMUSCULAR | Status: AC
  Filled 2016-09-02: qty 5

## 2016-09-02 MED ORDER — GABAPENTIN 300 MG PO CAPS
300.0000 mg | ORAL_CAPSULE | ORAL | Status: AC
  Administered 2016-09-02: 300 mg via ORAL
  Filled 2016-09-02: qty 1

## 2016-09-02 MED ORDER — ONDANSETRON HCL 4 MG/2ML IJ SOLN
INTRAMUSCULAR | Status: AC
  Filled 2016-09-02: qty 4

## 2016-09-02 MED ORDER — CEFOTETAN DISODIUM-DEXTROSE 2-2.08 GM-% IV SOLR
2.0000 g | INTRAVENOUS | Status: AC
  Administered 2016-09-02: 2 g via INTRAVENOUS

## 2016-09-02 MED ORDER — ONDANSETRON HCL 4 MG/2ML IJ SOLN
INTRAMUSCULAR | Status: DC | PRN
  Administered 2016-09-02: 8 mg via INTRAVENOUS

## 2016-09-02 MED ORDER — PROPOFOL 10 MG/ML IV BOLUS
INTRAVENOUS | Status: AC
  Filled 2016-09-02: qty 40

## 2016-09-02 MED ORDER — MIDAZOLAM HCL 2 MG/2ML IJ SOLN
INTRAMUSCULAR | Status: AC
  Filled 2016-09-02: qty 2

## 2016-09-02 MED ORDER — FENTANYL CITRATE (PF) 100 MCG/2ML IJ SOLN
INTRAMUSCULAR | Status: AC
  Filled 2016-09-02: qty 2

## 2016-09-02 MED ORDER — BUPIVACAINE LIPOSOME 1.3 % IJ SUSP
20.0000 mL | Freq: Once | INTRAMUSCULAR | Status: AC
  Administered 2016-09-02: 20 mL
  Filled 2016-09-02: qty 20

## 2016-09-02 MED ORDER — CEFOTETAN DISODIUM-DEXTROSE 2-2.08 GM-% IV SOLR
2.0000 g | Freq: Two times a day (BID) | INTRAVENOUS | Status: AC
  Administered 2016-09-02: 2 g via INTRAVENOUS
  Filled 2016-09-02: qty 50

## 2016-09-02 MED ORDER — SUGAMMADEX SODIUM 200 MG/2ML IV SOLN
INTRAVENOUS | Status: AC
  Filled 2016-09-02: qty 2

## 2016-09-02 MED ORDER — KCL IN DEXTROSE-NACL 20-5-0.45 MEQ/L-%-% IV SOLN
INTRAVENOUS | Status: DC
  Administered 2016-09-02: 100 mL/h via INTRAVENOUS
  Administered 2016-09-02 – 2016-09-04 (×3): via INTRAVENOUS
  Filled 2016-09-02 (×4): qty 1000

## 2016-09-02 MED ORDER — LORATADINE 10 MG PO TABS
10.0000 mg | ORAL_TABLET | Freq: Every day | ORAL | Status: DC
  Administered 2016-09-02 – 2016-09-05 (×4): 10 mg via ORAL
  Filled 2016-09-02 (×4): qty 1

## 2016-09-02 MED ORDER — DIPHENHYDRAMINE HCL 25 MG PO CAPS: 50.0000 mg | ORAL_CAPSULE | Freq: Every evening | ORAL | Status: DC | PRN

## 2016-09-02 MED ORDER — ONDANSETRON HCL 4 MG PO TABS
4.0000 mg | ORAL_TABLET | Freq: Four times a day (QID) | ORAL | Status: DC | PRN
  Filled 2016-09-02: qty 1

## 2016-09-02 MED ORDER — ONDANSETRON HCL 4 MG/2ML IJ SOLN: 4.0000 mg | Freq: Four times a day (QID) | INTRAMUSCULAR | Status: DC | PRN

## 2016-09-02 MED ORDER — PROMETHAZINE HCL 25 MG/ML IJ SOLN: 6.2500 mg | INTRAMUSCULAR | Status: DC | PRN

## 2016-09-02 MED ORDER — FENTANYL CITRATE (PF) 250 MCG/5ML IJ SOLN
INTRAMUSCULAR | Status: AC
  Filled 2016-09-02: qty 5

## 2016-09-02 MED ORDER — SUGAMMADEX SODIUM 500 MG/5ML IV SOLN
INTRAVENOUS | Status: DC | PRN
  Administered 2016-09-02: 150 mg via INTRAVENOUS

## 2016-09-02 MED ORDER — ALVIMOPAN 12 MG PO CAPS
12.0000 mg | ORAL_CAPSULE | Freq: Once | ORAL | Status: AC
  Administered 2016-09-02: 12 mg via ORAL
  Filled 2016-09-02: qty 1

## 2016-09-02 MED ORDER — PANTOPRAZOLE SODIUM 40 MG PO TBEC
40.0000 mg | DELAYED_RELEASE_TABLET | Freq: Every day | ORAL | Status: DC
  Administered 2016-09-03 – 2016-09-05 (×3): 40 mg via ORAL
  Filled 2016-09-02 (×3): qty 1

## 2016-09-02 MED ORDER — LACTATED RINGERS IV SOLN
INTRAVENOUS | Status: DC | PRN
  Administered 2016-09-02 (×2): via INTRAVENOUS

## 2016-09-02 MED ORDER — VARENICLINE TARTRATE 1 MG PO TABS
1.0000 mg | ORAL_TABLET | Freq: Two times a day (BID) | ORAL | Status: DC
  Administered 2016-09-02 – 2016-09-05 (×6): 1 mg via ORAL
  Filled 2016-09-02 (×6): qty 1

## 2016-09-02 MED ORDER — HEPARIN SODIUM (PORCINE) 5000 UNIT/ML IJ SOLN
5000.0000 [IU] | Freq: Once | INTRAMUSCULAR | Status: AC
  Administered 2016-09-02: 5000 [IU] via SUBCUTANEOUS
  Filled 2016-09-02: qty 1

## 2016-09-02 MED ORDER — DEXAMETHASONE SODIUM PHOSPHATE 10 MG/ML IJ SOLN
INTRAMUSCULAR | Status: AC
  Filled 2016-09-02: qty 1

## 2016-09-02 MED ORDER — HYDROMORPHONE HCL 2 MG/ML IJ SOLN
INTRAMUSCULAR | Status: AC
Start: 2016-09-02 — End: 2016-09-03
  Filled 2016-09-02: qty 1

## 2016-09-02 MED ORDER — ACETAMINOPHEN 500 MG PO TABS
1000.0000 mg | ORAL_TABLET | Freq: Four times a day (QID) | ORAL | Status: AC
  Administered 2016-09-02 – 2016-09-03 (×4): 1000 mg via ORAL
  Filled 2016-09-02 (×4): qty 2

## 2016-09-02 MED ORDER — HYDROMORPHONE HCL 2 MG/ML IJ SOLN
1.0000 mg | INTRAMUSCULAR | Status: DC | PRN
  Administered 2016-09-02 – 2016-09-04 (×9): 1 mg via INTRAVENOUS
  Filled 2016-09-02 (×10): qty 1

## 2016-09-02 MED ORDER — BUPIVACAINE-EPINEPHRINE 0.25% -1:200000 IJ SOLN
INTRAMUSCULAR | Status: DC | PRN
Start: 2016-09-02 — End: 2016-09-02

## 2016-09-02 MED ORDER — ENOXAPARIN SODIUM 40 MG/0.4ML ~~LOC~~ SOLN
40.0000 mg | SUBCUTANEOUS | Status: DC
  Administered 2016-09-03 – 2016-09-05 (×3): 40 mg via SUBCUTANEOUS
  Filled 2016-09-02 (×3): qty 0.4

## 2016-09-02 MED ORDER — HYDROMORPHONE HCL 2 MG/ML IJ SOLN
0.2500 mg | INTRAMUSCULAR | Status: DC | PRN
  Administered 2016-09-02 (×4): 0.5 mg via INTRAVENOUS

## 2016-09-02 MED ORDER — LACTATED RINGERS IR SOLN
Status: DC | PRN
  Administered 2016-09-02: 1000 mL

## 2016-09-02 MED ORDER — CEFOTETAN DISODIUM 2 G IJ SOLR: 2.0000 g | INTRAMUSCULAR | Status: DC

## 2016-09-02 MED ORDER — LIDOCAINE HCL (CARDIAC) 20 MG/ML IV SOLN
INTRAVENOUS | Status: DC | PRN
  Administered 2016-09-02: 60 mg via INTRAVENOUS

## 2016-09-02 MED ORDER — PROPOFOL 10 MG/ML IV BOLUS
INTRAVENOUS | Status: AC
  Filled 2016-09-02: qty 20

## 2016-09-02 MED ORDER — 0.9 % SODIUM CHLORIDE (POUR BTL) OPTIME
TOPICAL | Status: DC | PRN
  Administered 2016-09-02: 2000 mL

## 2016-09-02 MED ORDER — CEFOTETAN DISODIUM-DEXTROSE 2-2.08 GM-% IV SOLR
INTRAVENOUS | Status: AC
  Filled 2016-09-02: qty 50

## 2016-09-02 MED ORDER — BUPIVACAINE HCL (PF) 0.25 % IJ SOLN
INTRAMUSCULAR | Status: DC | PRN
  Administered 2016-09-02: 30 mL

## 2016-09-02 MED ORDER — BUPIVACAINE HCL (PF) 0.25 % IJ SOLN
INTRAMUSCULAR | Status: AC
  Filled 2016-09-02: qty 30

## 2016-09-02 MED ORDER — ALVIMOPAN 12 MG PO CAPS
12.0000 mg | ORAL_CAPSULE | Freq: Two times a day (BID) | ORAL | Status: DC
  Administered 2016-09-03: 12 mg via ORAL
  Filled 2016-09-02: qty 1

## 2016-09-02 MED ORDER — PROPOFOL 10 MG/ML IV BOLUS
INTRAVENOUS | Status: DC | PRN
  Administered 2016-09-02: 50 mg via INTRAVENOUS
  Administered 2016-09-02: 150 mg via INTRAVENOUS

## 2016-09-02 MED ORDER — MIDAZOLAM HCL 5 MG/5ML IJ SOLN
INTRAMUSCULAR | Status: DC | PRN
  Administered 2016-09-02: 2 mg via INTRAVENOUS

## 2016-09-02 MED ORDER — GLYCOPYRROLATE 0.2 MG/ML IV SOSY
PREFILLED_SYRINGE | INTRAVENOUS | Status: AC
  Filled 2016-09-02: qty 5

## 2016-09-02 MED ORDER — CELECOXIB 200 MG PO CAPS
400.0000 mg | ORAL_CAPSULE | ORAL | Status: AC
  Administered 2016-09-02: 400 mg via ORAL
  Filled 2016-09-02: qty 2

## 2016-09-02 MED ORDER — GLYCOPYRROLATE 0.2 MG/ML IJ SOLN
INTRAMUSCULAR | Status: DC | PRN
  Administered 2016-09-02: 0.2 mg via INTRAVENOUS

## 2016-09-02 MED ORDER — ESCITALOPRAM OXALATE 20 MG PO TABS
20.0000 mg | ORAL_TABLET | Freq: Every day | ORAL | Status: DC
  Administered 2016-09-03 – 2016-09-05 (×3): 20 mg via ORAL
  Filled 2016-09-02 (×3): qty 1

## 2016-09-02 MED ORDER — ACETAMINOPHEN 500 MG PO TABS
1000.0000 mg | ORAL_TABLET | ORAL | Status: AC
  Administered 2016-09-02: 1000 mg via ORAL
  Filled 2016-09-02: qty 2

## 2016-09-02 SURGICAL SUPPLY — 73 items
APPLIER CLIP 5 13 M/L LIGAMAX5 (MISCELLANEOUS)
BLADE EXTENDED COATED 6.5IN (ELECTRODE) IMPLANT
CABLE HIGH FREQUENCY MONO STRZ (ELECTRODE) ×3 IMPLANT
CELLS DAT CNTRL 66122 CELL SVR (MISCELLANEOUS) IMPLANT
CHLORAPREP W/TINT 26ML (MISCELLANEOUS) ×3 IMPLANT
CLIP APPLIE 5 13 M/L LIGAMAX5 (MISCELLANEOUS) IMPLANT
COUNTER NEEDLE 20 DBL MAG RED (NEEDLE) ×3 IMPLANT
COVER MAYO STAND STRL (DRAPES) ×9 IMPLANT
DECANTER SPIKE VIAL GLASS SM (MISCELLANEOUS) ×3 IMPLANT
DERMABOND ADVANCED (GAUZE/BANDAGES/DRESSINGS) ×2
DERMABOND ADVANCED .7 DNX12 (GAUZE/BANDAGES/DRESSINGS) ×4 IMPLANT
DRAIN CHANNEL 19F RND (DRAIN) IMPLANT
DRAPE LAPAROSCOPIC ABDOMINAL (DRAPES) ×3 IMPLANT
DRAPE SURG IRRIG POUCH 19X23 (DRAPES) ×3 IMPLANT
DRSG OPSITE POSTOP 4X10 (GAUZE/BANDAGES/DRESSINGS) IMPLANT
DRSG OPSITE POSTOP 4X6 (GAUZE/BANDAGES/DRESSINGS) IMPLANT
DRSG OPSITE POSTOP 4X8 (GAUZE/BANDAGES/DRESSINGS) IMPLANT
ELECT PENCIL ROCKER SW 15FT (MISCELLANEOUS) ×9 IMPLANT
ELECT REM PT RETURN 15FT ADLT (MISCELLANEOUS) ×3 IMPLANT
EVACUATOR SILICONE 100CC (DRAIN) IMPLANT
GAUZE SPONGE 4X4 12PLY STRL (GAUZE/BANDAGES/DRESSINGS) ×3 IMPLANT
GLOVE BIO SURGEON STRL SZ 6.5 (GLOVE) ×6 IMPLANT
GLOVE BIOGEL PI IND STRL 7.0 (GLOVE) ×4 IMPLANT
GLOVE BIOGEL PI INDICATOR 7.0 (GLOVE) ×2
GOWN STRL REUS W/TWL 2XL LVL3 (GOWN DISPOSABLE) ×6 IMPLANT
GOWN STRL REUS W/TWL XL LVL3 (GOWN DISPOSABLE) ×12 IMPLANT
GRASPER ENDOPATH ANVIL 10MM (MISCELLANEOUS) IMPLANT
HANDLE STAPLE EGIA 4 XL (STAPLE) ×3 IMPLANT
HOLDER FOLEY CATH W/STRAP (MISCELLANEOUS) ×3 IMPLANT
IRRIG SUCT STRYKERFLOW 2 WTIP (MISCELLANEOUS) ×3
IRRIGATION SUCT STRKRFLW 2 WTP (MISCELLANEOUS) ×2 IMPLANT
LEGGING LITHOTOMY PAIR STRL (DRAPES) IMPLANT
LUBRICANT JELLY K Y 4OZ (MISCELLANEOUS) ×3 IMPLANT
PACK COLON (CUSTOM PROCEDURE TRAY) ×3 IMPLANT
PAD POSITIONING PINK XL (MISCELLANEOUS) ×3 IMPLANT
PORT LAP GEL ALEXIS MED 5-9CM (MISCELLANEOUS) ×3 IMPLANT
POSITIONER SURGICAL ARM (MISCELLANEOUS) ×3 IMPLANT
RELOAD EGIA 60 MED/THCK PURPLE (STAPLE) ×3 IMPLANT
RTRCTR WOUND ALEXIS 18CM MED (MISCELLANEOUS)
SCISSORS LAP 5X35 DISP (ENDOMECHANICALS) ×3 IMPLANT
SEALER TISSUE G2 STRG ARTC 35C (ENDOMECHANICALS) IMPLANT
SEALER TISSUE X1 CVD JAW (INSTRUMENTS) IMPLANT
SLEEVE XCEL OPT CAN 5 100 (ENDOMECHANICALS) ×3 IMPLANT
SPONGE DRAIN TRACH 4X4 STRL 2S (GAUZE/BANDAGES/DRESSINGS) IMPLANT
SPONGE LAP 18X18 X RAY DECT (DISPOSABLE) IMPLANT
STAPLER CIRC CVD 29MM 37CM (STAPLE) ×3 IMPLANT
STAPLER VISISTAT 35W (STAPLE) IMPLANT
SUT ETHILON 2 0 PS N (SUTURE) IMPLANT
SUT NOVA NAB GS-21 0 18 T12 DT (SUTURE) ×9 IMPLANT
SUT PDS AB 1 CTX 36 (SUTURE) IMPLANT
SUT PDS AB 1 TP1 96 (SUTURE) IMPLANT
SUT PROLENE 2 0 KS (SUTURE) ×3 IMPLANT
SUT SILK 2 0 (SUTURE) ×1
SUT SILK 2 0 SH CR/8 (SUTURE) ×3 IMPLANT
SUT SILK 2-0 18XBRD TIE 12 (SUTURE) ×2 IMPLANT
SUT SILK 3 0 (SUTURE) ×1
SUT SILK 3 0 SH CR/8 (SUTURE) ×3 IMPLANT
SUT SILK 3-0 18XBRD TIE 12 (SUTURE) ×2 IMPLANT
SUT VIC AB 2-0 SH 18 (SUTURE) ×9 IMPLANT
SUT VIC AB 2-0 SH 27 (SUTURE) ×2
SUT VIC AB 2-0 SH 27X BRD (SUTURE) ×4 IMPLANT
SUT VIC AB 3-0 SH 27 (SUTURE) ×1
SUT VIC AB 3-0 SH 27X BRD (SUTURE) ×2 IMPLANT
SUT VIC AB 4-0 PS2 27 (SUTURE) ×9 IMPLANT
SYS LAPSCP GELPORT 120MM (MISCELLANEOUS)
SYSTEM LAPSCP GELPORT 120MM (MISCELLANEOUS) IMPLANT
TAPE PAPER MEDFIX 1IN X 10YD (GAUZE/BANDAGES/DRESSINGS) ×3 IMPLANT
TOWEL OR NON WOVEN STRL DISP B (DISPOSABLE) ×3 IMPLANT
TRAY FOLEY W/METER SILVER 16FR (SET/KITS/TRAYS/PACK) ×3 IMPLANT
TROCAR BLADELESS OPT 5 100 (ENDOMECHANICALS) ×3 IMPLANT
TROCAR XCEL BLUNT TIP 100MML (ENDOMECHANICALS) IMPLANT
TUBING CONNECTING 10 (TUBING) ×3 IMPLANT
TUBING INSUF HEATED (TUBING) ×3 IMPLANT

## 2016-09-02 NOTE — Progress Notes (Signed)
Patient called unit(5:30) and informed NT that he had forgot to take pre-op antibiotics. NT notified OR(paul) who stated that OR nurse said patient could could choose to come on in knowing that doctor could cancel surgery. Patient decided to come on it to short stay and get ready for surgery.6 [facility] called to inform this nurse that they texted Dr. Marcello Moores above information about patient and no antibiotics.

## 2016-09-02 NOTE — Anesthesia Preprocedure Evaluation (Addendum)
Anesthesia Evaluation  Patient identified by MRN, date of birth, ID band Patient awake    Reviewed: Allergy & Precautions, NPO status , Patient's Chart, lab work & pertinent test results  Airway Mallampati: II  TM Distance: >3 FB Neck ROM: Full    Dental no notable dental hx.    Pulmonary neg pulmonary ROS, Current Smoker,    Pulmonary exam normal breath sounds clear to auscultation       Cardiovascular hypertension, Normal cardiovascular exam Rhythm:Regular Rate:Normal     Neuro/Psych negative neurological ROS  negative psych ROS   GI/Hepatic negative GI ROS, (+) Cirrhosis       ,   Endo/Other  negative endocrine ROS  Renal/GU negative Renal ROS  negative genitourinary   Musculoskeletal negative musculoskeletal ROS (+)   Abdominal   Peds negative pediatric ROS (+)  Hematology   Anesthesia Other Findings   Reproductive/Obstetrics negative OB ROS                             Anesthesia Physical Anesthesia Plan  ASA: III  Anesthesia Plan: General   Post-op Pain Management:    Induction: Intravenous  Airway Management Planned: Oral ETT  Additional Equipment:   Intra-op Plan:   Post-operative Plan: Extubation in OR  Informed Consent: I have reviewed the patients History and Physical, chart, labs and discussed the procedure including the risks, benefits and alternatives for the proposed anesthesia with the patient or authorized representative who has indicated his/her understanding and acceptance.   Dental advisory given  Plan Discussed with: CRNA and Surgeon  Anesthesia Plan Comments:         Anesthesia Quick Evaluation

## 2016-09-02 NOTE — Anesthesia Procedure Notes (Signed)
Procedure Name: Intubation Date/Time: 09/02/2016 8:50 AM Performed by: Claudia Desanctis Pre-anesthesia Checklist: Patient identified, Emergency Drugs available, Suction available, Patient being monitored and Timeout performed Patient Re-evaluated:Patient Re-evaluated prior to inductionOxygen Delivery Method: Circle system utilized Preoxygenation: Pre-oxygenation with 100% oxygen Intubation Type: IV induction Ventilation: Mask ventilation without difficulty Laryngoscope Size: Miller and 2 Grade View: Grade I Tube type: Oral Number of attempts: 1 Placement Confirmation: ETT inserted through vocal cords under direct vision,  positive ETCO2,  CO2 detector and breath sounds checked- equal and bilateral Secured at: 22 cm Tube secured with: Tape

## 2016-09-02 NOTE — Op Note (Signed)
09/02/2016  12:40 PM  PATIENT:  Frank Castillo  65 y.o. male  Patient Care Team: Celedonio Savage, MD as PCP - General (Family Medicine) Eppie Gibson, MD as Attending Physician (Radiation Oncology) Patrici Ranks, MD as Consulting Physician (Hematology and Oncology) Daneil Dolin, MD as Consulting Physician (Gastroenterology)  PRE-OPERATIVE DIAGNOSIS:  Colostomy, G tube, Port in place  POST-OPERATIVE DIAGNOSIS: Colostomy, G tube, Port in place, ventral hernia  PROCEDURE:  LAPAROSCOPIC COLOSTOMY REVERSAL REMOVAL OF GASTROSTOMY TUBE HERNIA REPAIR, INCISIONAL REMOVAL PORT-A-CATH SCAR REVISION    Surgeon(s): Leighton Ruff, MD Fanny Skates, MD  ASSISTANT: Dr Dalbert Batman   ANESTHESIA:   local and general  EBL: 144ml  Total I/O In: 1400 [I.V.:1400] Out: 300 [Urine:200; Blood:100]  Delay start of Pharmacological VTE agent (>24hrs) due to surgical blood loss or risk of bleeding:  no  DRAINS: none   SPECIMEN:  Source of Specimen:  none  DISPOSITION OF SPECIMEN:  N/A  COUNTS:  YES  PLAN OF CARE: Admit to inpatient   PATIENT DISPOSITION:  PACU - hemodynamically stable.  INDICATION:    This is a 66 year old male who underwent an emergent Hartmann procedure approximately 6 months ago for perforated colon. He is now ready for colostomy reversal. 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   The anatomy & physiology of the digestive tract was discussed.  The pathophysiology was discussed.  Natural history risks without surgery was discussed.   I worked to give an overview of the disease and the frequent need to have multispecialty involvement.  I feel the risks of no intervention will lead to serious problems that outweigh the operative risks; therefore, I recommended a partial colectomy to remove the pathology.  Laparoscopic & open techniques were discussed.   Risks such as  bleeding, infection, abscess, leak, reoperation, possible ostomy, hernia, heart attack, death, and other risks were discussed.  I noted a good likelihood this will help address the problem.   Goals of post-operative recovery were discussed as well.    The patient expressed understanding & wished to proceed with surgery.  OR FINDINGS:   Patient had moderate pelvic adhesions  No obvious metastatic disease on visceral parietal peritoneum or liver.  The anastomosis rests ~12 cm from the anal verge by rigid proctoscopy.  DESCRIPTION:   Informed consent was confirmed.  The patient underwent general anaesthesia without difficulty.  The patient was positioned appropriately.  VTE prevention in place.  The G-tube was removed using traction. This was intact. The patient's abdomen was clipped, prepped, & draped in a sterile fashion.  Surgical timeout confirmed our plan.    The patient was positioned in reverse Trendelenburg.  Abdominal entry was gained using a varies needle on the left upper quadrant.  Entry was clean.  I induced carbon dioxide insufflation.  Camera inspection revealed no injury.  Extra ports were carefully placed under direct laparoscopic visualization.  I cut down the omental adhesions to the abdominal wall  bluntly. after this was completed began to mobilize the small bowel out of the pelvis. Over the next 45 minutes to an hour we were able to carefully mobilize the small bowel from the pelvis and separated it from the rectosigmoid mesentery using a combination of sharp dissection, blunt dissection and the laparoscopic Enseal device.   after his able to mobilize the small bowel out of the pelvis the cecum and  appendix were also mobilized to the patient's right side. I then attempted to visualize the rectal stump. I had a nurse placed a rectal dilator into the patient's anus and we identified the rectal wall. I then began to bluntly dissect the rectal stump starting at the staple line and  working my way distally. I dissected the mesorectal plane posteriorly to allow for mobilization anteriorly. I then bluntly dissected laterally. I identified the left ureter in its normal position and then divided the remaining left-sided mesenteric attachments.  At this point the colostomy was taken down using electrocautery around the colostomy site and subcutaneous tissue until her down to the level of the fascia. The fascia was incised bluntly and the remainder of the colonic attachments were divided using electrocautery. After this was completed the edge of the colostomy was excised and a 29 mm EEA anvil was placed with a 2-0 Prolene pursestring suture to tie it securely into place. This was then placed back into the abdomen. An Alexis wound protector was placed into the colostomy site and the cap was placed onto the retractor. The abdomen was reinsufflated. I mobilized a couple loops of small bowel that were adherent to the left pelvic sidewall using blunt dissection. I was then able to drop the remaining proximal colon into the pelvis without difficulty. At this point my partner Dr. Dalbert Batman went down to the patient's perineum and began to bluntly dilate the rectum using the rectal dilators. There was a portion of rectum that was stenotic at approximately 12 cm from anal verge. I decided to resect the remaining rectum so that and an anastomosis could be performed. I divided the mesentery with a vessel sealer and then divided the last 3-4 cm of proximal rectum with a purple load laparoscopic Covidian stapler.   This was then removed from the ostomy site and the abdomen was then reinsufflated. We were able to pass the 29 mm EEA stapler into the rectum and brought this out the end of the rectal stump. An anastomosis was created without difficulty. There was no tension. There was no leak when tested with insufflation under water. The anastomosis rests approximately 12 cm from anal verge.   At this point the  abdomen was irrigated. Hemostasis was achieved along the omental edge. There was no signs of active bleeding. I ran the small bowel and found no signs of any injuries. The remaining abdomen also appeared to be free of injury. Once this was completed the abdomen was desufflated. The ports were removed. The Alexis retractor was removed. We switched to clean gowns, gloves, instrument and drapes. The colostomy fascia was then closed using interrupted 0 Novafil sutures. The subcutaneous tissue was reapproximated with a 2-0 Vicryl pursestring suture. The skin was also reapproximated using a 2-0 Vicryl pursestring suture. A Telfa wick was placed through the middle and a dressing was applied. The patient was noted to have a rather large ventral hernia. I decided to attempt to close this primarily. An incision was made around the scar using a 10 blade scalpel. The scar was removed also with a 10 blade scalpel. I then identified the fascial edges and separated these from the subcutaneous tissue and scar using electric artery. The fascia was then reapproximated using the 0 Vicryl sutures. The subcutaneous tissue was then closed with a running 2-0 Vicryl suture. The remaining midline wound was then closed with a running 4-0 Vicryl suture in a sterile dressing was applied. The remaining port sites were closed using 4-0  Vicryl suture. Skin glue was applied.   We then removed the drapes and prep the patient's right chest. The port site was draped out separately and an incision was made over the previous scar using a 10 blade scalpel. Dissection was carried down around the port using electrocautery. I placed a 3-0 Vicryl pursestring suture around the entrance to the catheter site. The remaining port was removed. There were no sutures attaching the port to the chest wall. The port was intact. We then pulled the catheter and closed the pursestring suture as the catheter was removed. I then used a 3-0 Vicryl suture to close the  capsule. The skin was closed with a running 4-0 Vicryl suture and skin glue.   The patient tolerated this well. He was awakened from anesthesia and sent to the post anesthesia care unit in stable condition. All counts were correct per operating room staff. Dr. Dalbert Batman was present for the colostomy reversal part of the case to help with lysis of adhesion and bowel retraction as well as to help with anastomosis of the colon.

## 2016-09-02 NOTE — Transfer of Care (Signed)
Immediate Anesthesia Transfer of Care Note  Patient: Kendrick Haapala  Procedure(s) Performed: Procedure(s): LAPAROSCOPIC COLOSTOMY REVERSAL (N/A) SCAR REVISION (N/A) REMOVAL OF GASTROSTOMY TUBE (N/A) REMOVAL PORT-A-CATH HERNIA REPAIR INCISIONAL  Patient Location: PACU  Anesthesia Type:General  Level of Consciousness: awake, alert , oriented and patient cooperative  Airway & Oxygen Therapy: Patient Spontanous Breathing and Patient connected to face mask oxygen  Post-op Assessment: Post -op Vital signs reviewed and stable and Patient moving all extremities  Post vital signs: Reviewed  Last Vitals:  Vitals:   09/02/16 0635 09/02/16 1254  BP: 139/81 125/73  Pulse: 82 73  Resp: 16 10  Temp: 36.6 C     Last Pain:  Vitals:   09/02/16 0655  TempSrc:   PainSc: 0-No pain      Patients Stated Pain Goal: 3 (64/38/38 1840)  Complications: No apparent anesthesia complications

## 2016-09-02 NOTE — H&P (Signed)
The patient is a 66 year old male presenting for a post-operative visit. 67 year old male who underwent emergent Hartman's procedure for perforated rectosigmoid colon. His recovery in hospital was prolonged due to his pain control and ileus. He was discharged to a skilled nursing facility on postop day 11. Since that time he has been recovering well. He has stopped using his G-tube for feeding. He is tolerating a semi solid diet rather well. He is having bowel function. He is not having any trouble pouching his ostomy site. His surgical wound is healed.    Problem List/Past Medical  POST-OPERATIVE STATE [phone])  Past Surgical History Open Inguinal Hernia Surgery Right. Tonsillectomy  Diagnostic Studies History  Colonoscopy never  Allergies  No Known Drug Allergies 04/27/2016  Medication History  TriLyte (420GM For Solution, Oral) Active. FentaNYL (12MCG/HR Patch 72HR, 1 (one) Patch Transdermal every three days, Taken starting 04/08/2016) Active. Ativan (0.5MG  Tablet, 1 (one) Tablet Oral every six hours, as needed, Taken starting 04/08/2016) Active. Ciprofloxacin HCl (250MG  Tablet, Oral) Active. OxyCODONE HCl (5MG /5ML Solution, Oral as needed) Active. ZyrTEC (10MG  Tablet, Oral daily) Active. Lexapro (10MG  Tablet, Oral daily) Active. Salagen (5MG  Tablet, Oral as needed) Active.   Social History  Alcohol use Remotely quit alcohol use. Caffeine use Coffee. Illicit drug use Remotely quit drug use. Tobacco use Current every day smoker.  Family History  Alcohol Abuse Father. Arthritis Mother.  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 Not Present- Chills, Fever and Weight Gain. Skin Not Present- Change in Wart/Mole, Dryness, Hives, Jaundice, New Lesions, Non-Healing Wounds, Rash and Ulcer. HEENT  Present- Ringing in the Ears and Seasonal Allergies. Not Present- Earache, Hearing Loss, Hoarseness, Nose Bleed, Oral Ulcers, Sinus Pain, Sore Throat, Visual Disturbances, Wears glasses/contact lenses and Yellow Eyes. Respiratory Not Present- Bloody sputum, Chronic Cough, Difficulty Breathing, Snoring and Wheezing. Cardiovascular Not Present- Chest Pain, Difficulty Breathing Lying Down, Leg Cramps, Palpitations, Rapid Heart Rate, Shortness of Breath and Swelling of Extremities. Gastrointestinal Present- Abdominal Pain, Bloating, Change in Bowel Habits, Constipation, Difficulty Swallowing and Nausea. Not Present- Bloody Stool, Chronic diarrhea, Excessive gas, Gets full quickly at meals, Hemorrhoids, Indigestion, Rectal Pain and Vomiting. Musculoskeletal Present- Back Pain and Muscle Weakness. Not Present- Joint Pain, Joint Stiffness, Muscle Pain and Swelling of Extremities.  BP 139/81   Pulse 82   Temp 97.9 F (36.6 C) (Oral)   Resp 16   Ht 5\' 10"  (1.778 m)   Wt 72.1 kg (159 lb)   SpO2 100%   BMI 22.81 kg/m    Physical Exam   General Mental Status-Alert. General Appearance-Not in acute distress. Build & Nutrition-Well nourished. Posture-Normal posture. Gait-Normal.  Head and Neck Head-normocephalic, atraumatic with no lesions or palpable masses. Trachea-midline.  Chest and Lung Exam Chest and lung exam reveals -on auscultation, normal breath sounds, no adventitious sounds and normal vocal resonance.  Cardiovascular Cardiovascular examination reveals -normal heart sounds, regular rate and rhythm with no murmurs and no digital clubbing, cyanosis, edema, increased warmth or tenderness.  Abdomen Inspection Inspection of the abdomen reveals - No Hernias. Skin - Scar - Periumbilical(Well-healed, stellate appearing scar). Colostomy - Left Lower Quadrant. Palpation/Percussion Palpation and Percussion of the abdomen reveal - Soft, Non Tender, No Rigidity  (guarding), No hepatosplenomegaly and No Palpable abdominal masses.  Neurologic Neurologic evaluation reveals -alert and oriented x 3 with no impairment of recent or remote memory, normal attention  span and ability to concentrate, normal sensation and normal coordination.  Musculoskeletal Normal Exam - Bilateral-Upper Extremity Strength Normal and Lower Extremity Strength Normal.    Assessment & Plan   POST-OPERATIVE STATE [phone]) Impression: Patient is status post Hartman's procedure for perforated rectosigmoid colon. 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 He is tolerating a diet and is not using his G-tube. He is off all narcotics. He is not losing weight. I think this is a good time to reverse his ostomy. I think this can be done laparoscopically but he is aware that we may need to open his fascia. He has some skin puckering due to his wound healing. I recommended that we revise his scar during surgery.  We will remove the G tube and port per his oncologist's request.   The surgery and anatomy were described to the patient as well as the risks of surgery and the possible complications. These include: Bleeding, deep abdominal infections and possible wound complications such as hernia and infection, damage to adjacent structures, leak of surgical connections, which can lead to other surgeries and possibly an ostomy, possible need for other procedures, such as abscess drains in radiology, possible prolonged hospital stay, possible diarrhea from removal of part of the colon, possible constipation from narcotics, possible bowel, bladder or sexual dysfunction if having rectal surgery, prolonged fatigue/weakness or appetite loss, possible early recurrence of of disease, possible complications of their medical problems such as heart disease or arrhythmias or lung problems, death (less than 1%). I believe the patient understands and wishes to proceed with the  surgery.

## 2016-09-02 NOTE — Anesthesia Postprocedure Evaluation (Signed)
Anesthesia Post Note  Patient: Frank Castillo  Procedure(s) Performed: Procedure(s) (LRB): LAPAROSCOPIC COLOSTOMY REVERSAL (N/A) SCAR REVISION (N/A) REMOVAL OF GASTROSTOMY TUBE (N/A) REMOVAL PORT-A-CATH HERNIA REPAIR INCISIONAL  Patient location during evaluation: PACU Anesthesia Type: General Level of consciousness: awake and alert Pain management: pain level controlled Vital Signs Assessment: post-procedure vital signs reviewed and stable Respiratory status: spontaneous breathing, nonlabored ventilation, respiratory function stable and patient connected to nasal cannula oxygen Cardiovascular status: blood pressure returned to baseline and stable Postop Assessment: no signs of nausea or vomiting Anesthetic complications: no       Last Vitals:  Vitals:   09/02/16 1400 09/02/16 1500  BP: (!) 154/87 (!) 157/81  Pulse: 82 86  Resp: 12 16  Temp: 36.7 C 36.4 C    Last Pain:  Vitals:   09/02/16 1500  TempSrc: Oral  PainSc:                  Chakira Jachim S

## 2016-09-03 LAB — CBC
HCT: 34.4 % — ABNORMAL LOW (ref 39.0–52.0)
Hemoglobin: 11.3 g/dL — ABNORMAL LOW (ref 13.0–17.0)
MCH: 29.2 pg (ref 26.0–34.0)
MCHC: 32.8 g/dL (ref 30.0–36.0)
MCV: 88.9 fL (ref 78.0–100.0)
PLATELETS: 172 10*3/uL (ref 150–400)
RBC: 3.87 MIL/uL — ABNORMAL LOW (ref 4.22–5.81)
RDW: 14.4 % (ref 11.5–15.5)
WBC: 8.2 10*3/uL (ref 4.0–10.5)

## 2016-09-03 LAB — GLUCOSE, CAPILLARY: GLUCOSE-CAPILLARY: 103 mg/dL — AB (ref 65–99)

## 2016-09-03 LAB — BASIC METABOLIC PANEL
Anion gap: 5 (ref 5–15)
BUN: 13 mg/dL (ref 6–20)
CO2: 29 mmol/L (ref 22–32)
CREATININE: 0.94 mg/dL (ref 0.61–1.24)
Calcium: 8.6 mg/dL — ABNORMAL LOW (ref 8.9–10.3)
Chloride: 102 mmol/L (ref 101–111)
GFR calc Af Amer: >60 mL/min (ref >60)
Glucose, Bld: 101 mg/dL — ABNORMAL HIGH (ref 65–99)
Potassium: 4.9 mmol/L (ref 3.5–5.1)
SODIUM: 136 mmol/L (ref 135–145)

## 2016-09-03 NOTE — Progress Notes (Signed)
Responding to spiritual care consult.    Completed advance directives with pt.  Original and copies with pt, copy in chart on unit, copy with medical records to be scanned into EPIC   [facility] / [facility] Chaplain Jerene Pitch, MDiv  24h pager [phone]

## 2016-09-03 NOTE — Evaluation (Signed)
Physical Therapy Evaluation Patient Details Name: Frank Castillo MRN: [id number] DOB: 03/31/1951 Today's Date: 09/03/2016   History of Present Illness  Pt admitted for colostomy reversal and with hx of colostomy ~ 6 months ago as well as head and neck CA  Clinical Impression  Pt admitted as above and currently mobilizing at Mod I to IND level with no notable balance/strength deficits.  Pt states he is close to baseline and limited primarily by discomfort at surgical sites.  Discussed with RN, nursing will continue to ambulate with pt and PT services will sign off.    Follow Up Recommendations No PT follow up    Equipment Recommendations  None recommended by PT    Recommendations for Other Services       Precautions / Restrictions Restrictions Weight Bearing Restrictions: No      Mobility  Bed Mobility Overal bed mobility: Modified Independent             General bed mobility comments: Pt self cued to roll in/out of bed  Transfers Overall transfer level: Modified independent Equipment used: None;4-wheeled walker Transfers: Sit to/from Stand Sit to Stand: Modified independent (Device/Increase time)         General transfer comment: pt unassisted sit<>stand  Ambulation/Gait Ambulation/Gait assistance: Supervision;Independent Ambulation Distance (Feet): 900 Feet Assistive device: None Gait Pattern/deviations: WFL(Within Functional Limits);Wide base of support Gait velocity: mod pace   General Gait Details: Min instability with no LOB.  Pt demonstrated ability to step bkwd and sideways, stork stand and tandem walk with min difficulty.  Stairs            Wheelchair Mobility    Modified Rankin (Stroke Patients Only)       Balance Overall balance assessment: No apparent balance deficits (not formally assessed)                                           Pertinent Vitals/Pain Pain Assessment: 0-10 Pain Score: 3  Pain Location:  previous stoma site Pain Descriptors / Indicators: Sore Pain Intervention(s): Limited activity within patient's tolerance;Monitored during session;Premedicated before session;Ice applied    Home Living Family/patient expects to be discharged to:: Private residence Living Arrangements: Parent Available Help at Discharge: Family Type of Home: House Home Access: Stairs to enter Entrance Stairs-Rails: None Entrance Stairs-Number of Steps: 2 Home Layout: One level Home Equipment: Environmental consultant - 2 wheels Additional Comments: Mother has personal care aide part time.  Pt's sister from out of town is currently assisting    Prior Function Level of Independence: Independent               Journalist, newspaper        Extremity/Trunk Assessment   Upper Extremity Assessment Upper Extremity Assessment: Overall WFL for tasks assessed    Lower Extremity Assessment Lower Extremity Assessment: Overall WFL for tasks assessed    Cervical / Trunk Assessment Cervical / Trunk Assessment: Normal  Communication   Communication: No difficulties  Cognition Arousal/Alertness: Awake/alert Behavior During Therapy: WFL for tasks assessed/performed Overall Cognitive Status: Within Functional Limits for tasks assessed                                        General Comments      Exercises     Assessment/Plan  PT Assessment Patent does not need any further PT services  PT Problem List         PT Treatment Interventions      PT Goals (Current goals can be found in the Care Plan section)  Acute Rehab PT Goals Patient Stated Goal: Regain IND and return to work PT Goal Formulation: All assessment and education complete, DC therapy    Frequency     Barriers to discharge        Co-evaluation               End of Session Equipment Utilized During Treatment: Gait belt Activity Tolerance: Patient tolerated treatment well Patient left: in bed;with call bell/phone within  reach Nurse Communication: Mobility status PT Visit Diagnosis: Unsteadiness on feet (R26.81)    Time: 7782-4235 PT Time Calculation (min) (ACUTE ONLY): 22 min   Charges:   PT Evaluation $PT Eval Low Complexity: 1 Procedure     PT G Codes:        Pg [phone]   Taija Mathias 09/03/2016, 12:18 PM

## 2016-09-03 NOTE — Progress Notes (Signed)
Initial Nutrition Assessment  INTERVENTION:   Diet advancement per MD Will monitor for needs once diet is advanced  NUTRITION DIAGNOSIS:   Inadequate oral intake related to inability to eat as evidenced by NPO status.  GOAL:   Patient will meet greater than or equal to 90% of their needs  MONITOR:   Diet advancement, Labs, Weight trends, I & O's  REASON FOR ASSESSMENT:   Malnutrition Screening Tool    ASSESSMENT:   66 year old male presenting for a post-operative visit. 66 year old male who underwent emergent Hartman's procedure for perforated rectosigmoid colon.  His recovery in hospital was prolonged due to his pain control and ileus.  He was discharged to a skilled nursing facility on postop day 11.  Since that time he has been recovering well.  He has stopped using his G-tube for feeding.  He is tolerating a semi solid diet rather well.  He is having bowel function.  He is not having any trouble pouching his ostomy site.  His surgical wound is healed.  4/11: s/p LAPAROSCOPIC COLOSTOMY REVERSAL (N/A) SCAR REVISION (N/A) REMOVAL OF GASTROSTOMY TUBE (N/A) REMOVAL PORT-A-CATH HERNIA REPAIR INCISIONAL  Patient with history of tonsillar cancer, followed by [place] RD. Pt with history of dysphagia and colostomy. Had a PEG placed for feeds. Now post-op removal of G-tube and reversal of colostomy.  Weight has remained stable and increasing. Still NPO. Will monitor for any nutritional needs after diet is advanced and pt eating. No muscle or fat depletion noted.  Labs reviewed. Medications: Protonix tablet daily, D5 and .45% NaCl w/ KCl infusion at 100 ml/hr -provides 408 kcal  Diet Order:  Diet NPO time specified Except for: [employer], Sips with Meds  Skin:  Reviewed, no issues  Last BM:  4/11  Height:   Ht Readings from Last 1 Encounters:  09/02/16 5\' 10"  (1.778 m)    Weight:   Wt Readings from Last 1 Encounters:  09/02/16 159 lb (72.1 kg)    Ideal Body  Weight:  75.5 kg  BMI:  Body mass index is 22.81 kg/m.  Estimated Nutritional Needs:   Kcal:  1800-2000  Protein:  85-95g  Fluid:  2L/day  EDUCATION NEEDS:   No education needs identified at this time  Clayton Bibles, MS, RD, LDN Pager: [phone] After Hours Pager: [phone]

## 2016-09-03 NOTE — Progress Notes (Signed)
1 Day Post-Op lap colostomy reversal, port removal, hernia repair and G tube removal Subjective: Feeling well.  Had a BM this AM.  Pain controlled.  Tolerating sips of clears  Objective: Vital signs in last 24 hours: Temp:  [97.5 F (36.4 C)-98.4 F (36.9 C)] 98.4 F (36.9 C) (04/12 1406) Pulse Rate:  [79-86] 84 (04/12 1406) Resp:  [16-18] 16 (04/12 1406) BP: (106-157)/(68-82) 125/73 (04/12 1406) SpO2:  [98 %-100 %] 99 % (04/12 1406)   Intake/Output from previous day: 04/11 0701 - 04/12 0700 In: 3420 [P.O.:480; I.V.:2940] Out: 1925 [Urine:1825; Blood:100] Intake/Output this shift: Total I/O In: 645 [I.V.:645] Out: 520 [Urine:520]   General appearance: alert and cooperative GI: normal findings: soft, non-tender  Incision: slight bloody drainage noted  Lab Results:   Recent Labs  09/03/16 0503  WBC 8.2  HGB 11.3*  HCT 34.4*  PLT 172   BMET  Recent Labs  09/03/16 0503  NA 136  K 4.9  CL 102  CO2 29  GLUCOSE 101*  BUN 13  CREATININE 0.94  CALCIUM 8.6*   PT/INR No results for input(s): LABPROT, INR in the last 72 hours. ABG No results for input(s): PHART, HCO3 in the last 72 hours.  Invalid input(s): PCO2, PO2  MEDS, Scheduled . enoxaparin (LOVENOX) injection  40 mg Subcutaneous Q24H  . escitalopram  20 mg Oral Daily  . loratadine  10 mg Oral Daily  . pantoprazole  40 mg Oral Daily  . varenicline  1 mg Oral BID    Studies/Results: No results found.  Assessment: s/p Procedure(s): LAPAROSCOPIC COLOSTOMY REVERSAL SCAR REVISION REMOVAL OF GASTROSTOMY TUBE REMOVAL PORT-A-CATH HERNIA REPAIR INCISIONAL Patient Active Problem List   Diagnosis Date Noted  . Colostomy in place [facility]) 09/02/2016  . Odynophagia 06/29/2016  . Colon cancer screening 06/29/2016  . Dysphagia 06/29/2016  . H/O colostomy 06/29/2016  . Iron deficiency anemia 05/07/2016  . Perforated sigmoid colon ([place]) 03/28/2016  . Reactive depression (situational) 02/29/2016  .  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 Tobacco use disorder 02/27/2016  . Tinnitus 02/27/2016  . Dysuria 10/25/2015  . Decreased oral intake 10/25/2015  . Malnutrition of moderate degree 10/19/2015  . Antineoplastic chemotherapy induced pancytopenia ([place]) 10/18/2015  . Fever   . Candidiasis of mouth and esophagus ([place]) 10/17/2015  . Neutropenia, febrile ([place]) 10/17/2015  . Shingles rash 10/17/2015  . Nausea with vomiting 09/20/2015  . Hyponatremia 09/20/2015  . Oral pharyngeal candidiasis 09/20/2015  . Antineoplastic chemotherapy induced anemia 09/20/2015  . Hypertension   . Cirrhosis ([place])   . Portal hypertension ([place])   . Cancer of tonsillar fossa ([place]) 08/02/2015  . 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

## 2016-09-03 NOTE — Consult Note (Signed)
[place] Nurse ostomy follow up Patient seen POD 1 after ostomy takedown.  Is doing well.  Wanted to see ostomy nurse to relay past 6 months, come to closure with cancer diagnosis and ostomy, also to donate surplus supplies to charity.  Expresses gratitude for support and aftercare. Had BM in commode today. Dr. Marcello Moores in during my assessment; staff to remove surgical dressing tomorrow. [place] nursing team will not follow, but will remain available to this patient, the nursing and medical teams.  Please re-consult if needed. Thanks, Maudie Flakes, MSN, RN, [place], Arther Abbott  Pager# [phone]

## 2016-09-03 NOTE — Progress Notes (Signed)
Patient stated that he had a small BM today at @ 1400. Will d/c entereg. Foley catheter is out at 1430 and diet was advanced to full liquids.

## 2016-09-04 ENCOUNTER — Encounter (HOSPITAL_COMMUNITY): Payer: Self-pay | Admitting: General Surgery

## 2016-09-04 LAB — BASIC METABOLIC PANEL
Anion gap: 5 (ref 5–15)
BUN: 8 mg/dL (ref 6–20)
CHLORIDE: 101 mmol/L (ref 101–111)
CO2: 28 mmol/L (ref 22–32)
CREATININE: 0.62 mg/dL (ref 0.61–1.24)
Calcium: 8.5 mg/dL — ABNORMAL LOW (ref 8.9–10.3)
GFR calc Af Amer: >60 mL/min (ref >60)
GFR calc non Af Amer: >60 mL/min (ref >60)
Glucose, Bld: 102 mg/dL — ABNORMAL HIGH (ref 65–99)
Potassium: 3.9 mmol/L (ref 3.5–5.1)
Sodium: 134 mmol/L — ABNORMAL LOW (ref 135–145)

## 2016-09-04 LAB — CBC
HCT: 31.4 % — ABNORMAL LOW (ref 39.0–52.0)
Hemoglobin: 10.5 g/dL — ABNORMAL LOW (ref 13.0–17.0)
MCH: 30.3 pg (ref 26.0–34.0)
MCHC: 33.4 g/dL (ref 30.0–36.0)
MCV: 90.8 fL (ref 78.0–100.0)
PLATELETS: 163 10*3/uL (ref 150–400)
RBC: 3.46 MIL/uL — ABNORMAL LOW (ref 4.22–5.81)
RDW: 14.5 % (ref 11.5–15.5)
WBC: 7.8 10*3/uL (ref 4.0–10.5)

## 2016-09-04 MED ORDER — OXYCODONE HCL 5 MG PO TABS
5.0000 mg | ORAL_TABLET | ORAL | Status: DC | PRN
  Administered 2016-09-04 (×2): 10 mg via ORAL
  Administered 2016-09-04 (×2): 5 mg via ORAL
  Administered 2016-09-05 (×3): 10 mg via ORAL
  Filled 2016-09-04: qty 2
  Filled 2016-09-04: qty 1
  Filled 2016-09-04 (×4): qty 2
  Filled 2016-09-04: qty 1

## 2016-09-04 NOTE — Consult Note (Signed)
[place] Nurse ostomy follow up Patient asking questions about lifting and diet advancement and I am happy to respond with guideline for abdominal surgery-do not lift more than 20# for 3 months due to the risk of hernia formation, advance as tolerated avoiding greasy foods and remembering to replace fluids following voids. Will likely discharge home tomorrow or Sunday. Patient has my contact information for emergent issues. [place] nursing team will not follow, but will remain available to this patient, the nursing and medical teams.  Please re-consult if needed. Thanks, Maudie Flakes, MSN, RN, [place], Arther Abbott  Pager# [phone]

## 2016-09-04 NOTE — Progress Notes (Signed)
VSS. Pt tolerating diet. Up ad lib going to the bathroom on his own.

## 2016-09-04 NOTE — Discharge Instructions (Signed)

## 2016-09-04 NOTE — Progress Notes (Signed)
2 Days Post-Op lap colostomy reversal, port removal, hernia repair and G tube removal Subjective: Feeling well.  Having bowel function  Pain controlled.  Tolerating liquids  Objective: Vital signs in last 24 hours: Temp:  [98.4 F (36.9 C)-98.8 F (37.1 C)] 98.4 F (36.9 C) (04/13 0549) Pulse Rate:  [84-99] 88 (04/13 0549) Resp:  [16] 16 (04/13 0549) BP: (119-144)/(72-76) 119/76 (04/13 0549) SpO2:  [97 %-99 %] 97 % (04/13 0549)   Intake/Output from previous day: 04/12 0701 - 04/13 0700 In: 1323.3 [P.O.:240; I.V.:1083.3] Out: 2920 [Urine:2920] Intake/Output this shift: Total I/O In: 360 [P.O.:360] Out: 200 [Urine:200]   General appearance: alert and cooperative GI: normal findings: soft, non-tender  Incision: slight bloody drainage noted, clean  Lab Results:   Recent Labs  09/03/16 0503 09/04/16 0438  WBC 8.2 7.8  HGB 11.3* 10.5*  HCT 34.4* 31.4*  PLT 172 163   BMET  Recent Labs  09/03/16 0503 09/04/16 0438  NA 136 134*  K 4.9 3.9  CL 102 101  CO2 29 28  GLUCOSE 101* 102*  BUN 13 8  CREATININE 0.94 0.62  CALCIUM 8.6* 8.5*   PT/INR No results for input(s): LABPROT, INR in the last 72 hours. ABG No results for input(s): PHART, HCO3 in the last 72 hours.  Invalid input(s): PCO2, PO2  MEDS, Scheduled . enoxaparin (LOVENOX) injection  40 mg Subcutaneous Q24H  . escitalopram  20 mg Oral Daily  . loratadine  10 mg Oral Daily  . pantoprazole  40 mg Oral Daily  . varenicline  1 mg Oral BID    Studies/Results: No results found.  Assessment: s/p Procedure(s): LAPAROSCOPIC COLOSTOMY REVERSAL SCAR REVISION REMOVAL OF GASTROSTOMY TUBE REMOVAL PORT-A-CATH HERNIA REPAIR INCISIONAL Patient Active Problem List   Diagnosis Date Noted  . Colostomy in place [facility]) 09/02/2016  . Odynophagia 06/29/2016  . Colon cancer screening 06/29/2016  . Dysphagia 06/29/2016  . H/O colostomy 06/29/2016  . Iron deficiency anemia 05/07/2016  . Perforated sigmoid  colon ([place]) 03/28/2016  . Reactive depression (situational) 02/29/2016  . 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 Tobacco use disorder 02/27/2016  . Tinnitus 02/27/2016  . Dysuria 10/25/2015  . Decreased oral intake 10/25/2015  . Malnutrition of moderate degree 10/19/2015  . Antineoplastic chemotherapy induced pancytopenia ([place]) 10/18/2015  . Fever   . Candidiasis of mouth and esophagus ([place]) 10/17/2015  . Neutropenia, febrile ([place]) 10/17/2015  . Shingles rash 10/17/2015  . Nausea with vomiting 09/20/2015  . Hyponatremia 09/20/2015  . Oral pharyngeal candidiasis 09/20/2015  . Antineoplastic chemotherapy induced anemia 09/20/2015  . Hypertension   . Cirrhosis ([place])   . Portal hypertension ([place])   . Cancer of tonsillar fossa ([place]) 08/02/2015  . 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

## 2016-09-05 LAB — CBC
HCT: 30.2 % — ABNORMAL LOW (ref 39.0–52.0)
HEMOGLOBIN: 10 g/dL — AB (ref 13.0–17.0)
MCH: 29.1 pg (ref 26.0–34.0)
MCHC: 33.1 g/dL (ref 30.0–36.0)
MCV: 87.8 fL (ref 78.0–100.0)
Platelets: 179 10*3/uL (ref 150–400)
RBC: 3.44 MIL/uL — ABNORMAL LOW (ref 4.22–5.81)
RDW: 14.1 % (ref 11.5–15.5)
WBC: 6.1 10*3/uL (ref 4.0–10.5)

## 2016-09-05 LAB — BASIC METABOLIC PANEL
ANION GAP: 6 (ref 5–15)
BUN: 8 mg/dL (ref 6–20)
CHLORIDE: 100 mmol/L — AB (ref 101–111)
CO2: 28 mmol/L (ref 22–32)
Calcium: 8.7 mg/dL — ABNORMAL LOW (ref 8.9–10.3)
Creatinine, Ser: 0.67 mg/dL (ref 0.61–1.24)
GFR calc non Af Amer: >60 mL/min (ref >60)
Glucose, Bld: 107 mg/dL — ABNORMAL HIGH (ref 65–99)
Potassium: 3.8 mmol/L (ref 3.5–5.1)
SODIUM: 134 mmol/L — AB (ref 135–145)

## 2016-09-05 MED ORDER — OXYCODONE HCL 5 MG PO TABS: 5.0000 mg | ORAL_TABLET | ORAL | 0 refills | Status: DC | PRN

## 2016-09-05 NOTE — Discharge Summary (Addendum)
Physician Discharge Summary [facility]  Patient ID: Frank Castillo MRN: [id number] DOB/AGE: 64-06-1950 64 y.o.  Admit date: 09/02/2016 Discharge date: 09/05/2016  Admission Diagnoses:  Colostomy in place, gastrostomy in place, infusion port in place  Discharge Diagnoses:  Active Problems:   Colostomy in place [facility])   Discharged Condition: good  Hospital Course: Patient was admitted for observation following surgery.  Post op course was uncomplicated.  Pain was well controlled.  Tolerated diet and had return of bowel function.  Patient was prepared for discharge home on POD#3.  Consults: None  Treatments: surgery: colostomy closure, removal of gastrostomy tube and infusion port, repair of incisional hernia  Discharge Exam: Blood pressure 132/74, pulse 78, temperature 97.8 F (36.6 C), temperature source Oral, resp. rate 18, height 5\' 10"  (1.778 m), weight 72.1 kg (159 lb), SpO2 97 %. HEENT - clear Neck - soft Chest - clear bilaterally Cor - RRR Abd - dressing dry and intact  Disposition: Home  NCCSRS reviewed. The patient has a history of narcotic prescriptions. We specifically discussed a pain plan for the postoperative phase of care to limit further dependence issues and encouraged the patient to talk to their prescribers for other options.  [facility] [place], identifies chronic opioid use and prescription opioid abuse as major health issues and we are taking steps to ensure that surgical care can be accomplished with appropriate pain control while staying within guidelines to limit the potential for abuse of these pain medication products.   Discharge Instructions    Change dressing (specify)    Complete by:  As directed    Wound care as instructed.  May shower with wound open and packing removed.   Diet - low sodium heart healthy    Complete by:  As directed    Discharge instructions    Complete by:  As directed    [facility]  OPEN ABDOMINAL SURGERY: POST OP INSTRUCTIONS  Always review your discharge instruction sheet given to you by the facility where your surgery was performed.  A prescription for pain medication may be given to you upon discharge.  Take your pain medication as prescribed.  If narcotic pain medicine is not needed, then you may take acetaminophen (Tylenol) or ibuprofen (Advil) as needed. Take your usually prescribed medications unless otherwise directed. If you need a refill on your pain medication, please contact your pharmacy. They will contact our office to request authorization.  Prescriptions will not be filled after 5 pm or on weekends. You should follow a light diet the first few days after arrival home, such as soup and crackers, unless your doctor has advised otherwise. A high-fiber, low fat diet can be resumed as tolerated.  Be sure to include plenty of fluids daily.  Most patients will experience some swelling and bruising in the area of the incision. Ice packs will help. Swelling and bruising can take several days to resolve. It is common to experience some constipation if taking pain medication after surgery.  Increasing fluid intake and taking a stool softener will usually help or prevent this problem from occurring.  A mild laxative (Milk of Magnesia or Miralax) should be taken according to package directions if there are no bowel movements after 48 hours.  You may have steri-strips (small skin tapes) in place directly over the incision.  These strips should be left on the skin for 5-7 days.  Any sutures or staples will be removed at the office during your  follow-up visit. You may find that a light gauze bandage over your incision may keep your staples from being rubbed or pulled. You may shower and replace the bandage daily. ACTIVITIES:  You may resume regular (light) daily activities beginning the next day - such as daily self-care, walking, climbing stairs - gradually  increasing activities as tolerated.  You may have sexual intercourse when it is comfortable.  Refrain from any heavy lifting or straining until approved by your doctor.  You may drive when you no longer are taking prescription pain medication, you can comfortably wear a seatbelt, and you can safely maneuver your car and apply brakes. You should see your doctor in the office for a follow-up appointment approximately 2-3 weeks after your surgery.  Make sure that you call for this appointment within a day or two after you arrive home to insure a convenient appointment time.  WHEN TO CALL YOUR DOCTOR: Fever greater than 101.0 Inability to urinate Persistent nausea and/or vomiting Extreme swelling or bruising Continued bleeding from incision Increased pain, redness, or drainage from the incision Difficulty swallowing or breathing Muscle cramping or spasms Numbness or tingling in hands or around lips  IF YOU HAVE DISABILITY OR FAMILY LEAVE FORMS, YOU MUST BRING THEM TO THE OFFICE FOR PROCESSING.  PLEASE DO NOT GIVE THEM TO YOUR DOCTOR.  The clinic staff is available to answer your questions during regular business hours.  Please don't hesitate to call and ask to speak to one of the nurses if you have concerns.  [facility], [place] Office: [phone]  For further questions, please visit [email or web address]   Increase activity slowly    Complete by:  As directed      Allergies as of 09/05/2016      Reactions   Bee Venom Swelling   Other    Opthalmic mycin irritates eyes.       Medication List    TAKE these medications   cetirizine 10 MG tablet Commonly known as:  ZYRTEC Take 10 mg by mouth at bedtime.   cholecalciferol 1000 units tablet Commonly known as:  VITAMIN D Take 1,000 Units by mouth daily.   clotrimazole-betamethasone cream Commonly known as:  LOTRISONE Apply 1 application topically 2 (two) times daily as needed. For rash   diphenhydrAMINE 50 MG  tablet Commonly known as:  BENADRYL Take 50 mg by mouth at bedtime as needed for sleep.   escitalopram 20 MG tablet Commonly known as:  LEXAPRO Take 1 tablet (20 mg total) by mouth daily.   LIPOFLAVONOID Tabs Take 1 tablet by mouth 3 (three) times daily.   Melatonin 10 MG Tabs Take 1 tablet by mouth at bedtime.   oxyCODONE 5 MG immediate release tablet Commonly known as:  Oxy IR/ROXICODONE Take 1-2 tablets (5-10 mg total) by mouth every 4 (four) hours as needed for moderate pain, severe pain or breakthrough pain.   RABEprazole 20 MG tablet Commonly known as:  ACIPHEX Take 1 tablet (20 mg total) by mouth daily.   sodium fluoride 1.1 % Gel dental gel Commonly known as:  FLUORISHIELD Instill one drop of gel per tooth space of fluoride tray. Place over teeth for 5 minutes. Remove. Spit out excess. Repeat nightly.   varenicline 1 MG tablet Commonly known as:  CHANTIX CONTINUING MONTH PAK Take 1 tablet (1 mg total) by mouth 2 (two) times daily.   vitamin B-12 1000 MCG tablet Commonly known as:  CYANOCOBALAMIN Take 1,000 mcg by mouth daily.  Follow-up Information    Rosario Adie., MD. Schedule an appointment as soon as possible for a visit in 2 week(s).   Specialty:  General Surgery Contact information: [address] [place]  [phone]           Earnstine Regal, MD, [facility] Office: [phone]   Signed: Earnstine Regal 09/05/2016, 9:24 AM

## 2016-09-05 NOTE — Progress Notes (Signed)
Nurse reviewed discharge instructions with pt.  Pt verbalized understanding of discharge instructions, follow up appointment, wound care and new medication. Prescription given to pt prior to discharge.

## 2016-09-24 ENCOUNTER — Ambulatory Visit (INDEPENDENT_AMBULATORY_CARE_PROVIDER_SITE_OTHER): Payer: BLUE CROSS/BLUE SHIELD | Admitting: Otolaryngology

## 2016-09-24 DIAGNOSIS — C099 Malignant neoplasm of tonsil, unspecified: Secondary | ICD-10-CM | POA: Diagnosis not present

## 2016-09-29 ENCOUNTER — Ambulatory Visit (INDEPENDENT_AMBULATORY_CARE_PROVIDER_SITE_OTHER): Payer: BLUE CROSS/BLUE SHIELD | Admitting: Gastroenterology

## 2016-09-29 ENCOUNTER — Encounter: Payer: Self-pay | Admitting: Gastroenterology

## 2016-09-29 VITALS — BP 131/79 | HR 75 | Temp 97.8°F | Ht 70.0 in | Wt 151.2 lb

## 2016-09-29 DIAGNOSIS — K209 Esophagitis, unspecified without bleeding: Secondary | ICD-10-CM | POA: Insufficient documentation

## 2016-09-29 DIAGNOSIS — R1310 Dysphagia, unspecified: Secondary | ICD-10-CM

## 2016-09-29 NOTE — Progress Notes (Signed)
Primary Care Physician: Celedonio Savage, MD  Primary Gastroenterologist:  Garfield Cornea, MD   Chief Complaint  Patient presents with  . Gastroesophageal Reflux    pp f/u, pain in throat, no trouble swallowing, hx throat cancer    HPI: Frank Castillo is a 66 y.o. male here for follow-up. He was seen back in February. 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 See office note from 06/29/2016 for extensive review of his history. After his last office visit he underwent an EGD at the request of Dr. Whitney Muse for ongoing throat pain, pain with swallowing. Pain was significant enough that he continue to use his G-tube.He also had a colonoscopy prior to colostomy reversal.  EGD and colonoscopy on 07/02/2016. Moderate severe esophagitis (radiation and reflux related). He has cervical esophageal web. Esophagus was dilated. 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 No esophageal varices seen. Gastric biopsies benign with minimal chronic inflammation, no H pylori. Esophagus biopsy benign with hyperkeratosis possibly related to therapy effect. Colonoscopy through ostomy, digital examination ostomy shows stenosis, pediatric scope had to be used. Scattered residual diverticular seen all the way to the cecum. Hartman's pouch examined. Sigmoid colon ended in a blind pouch at 25 cm. Diverticulosis noted in this segment as well. Rectal mucosa appeared normal. Repeat colonoscopy in 10 years.  Patient is [facility] at [facility] for his cirrhosis felt to be related to prior alcohol abuse, sober since 2013.  Clinically patient is doing much better. Mild discomfort with swallowing. States he did see Dr. Benjamine Mola feels like everything looks okay from his perspective. Patient denies heartburn. Recovering from his colostomy reversal. States he's having mucus and  small amounts of blood per rectum which he plans to discuss with surgeon Dr. Marcello Moores tomorrow in his office visit. No constipation. No vomiting.   Patient was unable to get Dexilant. Insurance required Aciphex. He requested samples today.   Current Outpatient Prescriptions  Medication Sig Dispense Refill  . cetirizine (ZYRTEC) 10 MG tablet Take 10 mg by mouth at bedtime.     . cholecalciferol (VITAMIN D) 1000 units tablet Take 1,000 Units by mouth daily.    . clotrimazole-betamethasone (LOTRISONE) cream Apply 1 application topically 2 (two) times daily as needed. For rash 30 g 0  . diphenhydrAMINE (BENADRYL) 50 MG tablet Take 50 mg by mouth at bedtime as needed for sleep.    Marland Kitchen escitalopram (LEXAPRO) 20 MG tablet Take 1 tablet (20 mg total) by mouth daily. 30 tablet 3  . Melatonin 10 MG TABS Take 1 tablet by mouth at bedtime.    . RABEprazole (ACIPHEX) 20 MG tablet Take 1 tablet (20 mg total) by mouth daily. 30 tablet 11  . sodium fluoride (FLUORISHIELD) 1.1 % GEL dental gel Instill one drop of gel per tooth space of fluoride tray. Place over teeth for 5 minutes. Remove. Spit out excess. Repeat nightly. 125 mL 2  . varenicline (CHANTIX CONTINUING MONTH PAK) 1 MG tablet Take 1 tablet (1 mg total) by mouth 2 (two) times daily. 60 tablet 4  . vitamin B-12 (CYANOCOBALAMIN) 1000 MCG tablet Take 1,000 mcg by mouth daily.     No current facility-administered medications for this visit.    Facility-Administered Medications Ordered in Other Visits  Medication Dose Route Frequency Provider Last Rate Last Dose  . 0.9 %  sodium chloride infusion   Intravenous Continuous  Baird Cancer, PA-C      . 0.9 %  sodium chloride infusion   Intravenous Continuous Kefalas, Manon Hilding, PA-C      . 0.9 %  sodium chloride infusion   Intravenous Continuous [place], Larene Beach K, MD      . 0.9 %  sodium chloride infusion   Intravenous Continuous [place], Larene Beach K, MD      . 0.9 %  sodium chloride infusion   Intravenous  Continuous [place], Kelby Fam, MD        Allergies as of 09/29/2016 - Review Complete 09/29/2016  Allergen Reaction Noted  . Bee venom Swelling 07/31/2015  . Other  11/28/2015    ROS:  General: Negative for anorexia, weight loss, fever, chills, fatigue, weakness. ENT: Negative for hoarseness, difficulty swallowing , nasal congestion. CV: Negative for chest pain, angina, palpitations, dyspnea on exertion, peripheral edema.  Respiratory: Negative for dyspnea at rest, dyspnea on exertion, cough, sputum, wheezing.  GI: See history of present illness. GU:  Negative for dysuria, hematuria, urinary incontinence, urinary frequency, nocturnal urination.  Endo: Negative for unusual weight change.    Physical Examination:   BP 131/79   Pulse 75   Temp 97.8 F (36.6 C) (Oral)   Ht 5\' 10"  (1.778 m)   Wt 151 lb 3.2 oz (68.6 kg)   BMI 21.69 kg/m   General: Well-nourished, well-developed in no acute distress.  Eyes: No icterus. Mouth: Oropharyngeal mucosa moist and pink , no lesions erythema or exudate. Lungs: Clear to auscultation bilaterally.  Heart: Regular rate and rhythm, no murmurs rubs or gallops.  Abdomen: Bowel sounds are normal, nontender, nondistended, no hepatosplenomegaly or masses, no abdominal bruits or hernia , no rebound or guarding.  Abdominal incisions healing well. PEG site, closed with no drainage.  Extremities: No lower extremity edema. No clubbing or deformities. Neuro: Alert and oriented x 4   Skin: Warm and dry, no jaundice.   Psych: Alert and cooperative, normal mood and affect.  Labs:  Lab Results  Component Value Date   CREATININE 0.67 09/05/2016   BUN 8 09/05/2016   NA 134 (L) 09/05/2016   K 3.8 09/05/2016   CL 100 (L) 09/05/2016   CO2 28 09/05/2016   Lab Results  Component Value Date   WBC 6.1 09/05/2016   HGB 10.0 (L) 09/05/2016   HCT 30.2 (L) 09/05/2016   MCV 87.8 09/05/2016   PLT 179 09/05/2016   Normal hgb prior to reversal of  colostomy  Lab Results  Component Value Date   ALT 13 (L) 08/28/2016   AST 17 08/28/2016   ALKPHOS 48 08/28/2016   BILITOT 0.6 08/28/2016   Lab Results  Component Value Date   HGBA1C 5.5 08/28/2016    Imaging Studies: No results found.

## 2016-09-29 NOTE — Patient Instructions (Signed)
1. Take Dexilant once daily before breakfast until samples are out. Then go back to Aciphex once daily. 2. Call with a progress report in few weeks.  3. I will get a copy of your recent office note from Dr. Benjamine Mola for review.  4. Return to the office in six months.

## 2016-09-30 ENCOUNTER — Encounter: Payer: Self-pay | Admitting: Gastroenterology

## 2016-09-30 NOTE — Progress Notes (Signed)
CC'ED TO PCP 

## 2016-09-30 NOTE — Assessment & Plan Note (Signed)
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 Noted to have esophagitis related to prior radiation and probable reflux as well. Insurance would not pay for Dexilant so he has been on AcipHex once daily. He has noted significant improvement over the past couple months with his odynophagia. He is able to swallow/considering most foods. No longer has a PEG and has been maintaining his weight. Suspect continued improvement with time. Patient recently evaluated with "scope" by Dr. Benjamine Mola and reportedly still has radiation-induced changes but overall looked good. We have requested those records.  Provided patient with 4 weeks of Dexilant. See if this helps any further. If he finds that it is significantly better than AcipHex, we will attempt to get approved by insurance. He'll let me know how he is doing in a few weeks. Otherwise we'll see him back in 6 months. He'll continue to follow with [facility] for his cirrhosis.

## 2016-10-11 NOTE — Telephone Encounter (Signed)
error 

## 2016-10-12 ENCOUNTER — Encounter (HOSPITAL_COMMUNITY): Payer: Self-pay

## 2016-10-16 ENCOUNTER — Telehealth: Payer: Self-pay

## 2016-10-16 MED ORDER — DEXLANSOPRAZOLE 60 MG PO CPDR: 60.0000 mg | DELAYED_RELEASE_CAPSULE | Freq: Every day | ORAL | 11 refills | Status: DC

## 2016-10-16 NOTE — Telephone Encounter (Signed)
Working on [place]. Pt has [facility] [place].  Magda Paganini can you send in rx?

## 2016-10-16 NOTE — Telephone Encounter (Signed)
Pt called to let LSL know that the Dexilant is helping better than Omeprazole. He will run out of Dexilant samples next week and would like some more to get him through until it can be approved with his insurance. He would like to proceed with trying to get Dexilant approved with his insurance Nurse, mental health). Informed pt I will put 1 week of Dexilant samples at the front desk for him to pick-up.  Routing to JL and LSL.

## 2016-10-16 NOTE — Telephone Encounter (Signed)
rx sent

## 2016-10-20 NOTE — Telephone Encounter (Signed)
Called and informed pt.  

## 2016-10-26 NOTE — Anesthesia Postprocedure Evaluation (Signed)
Anesthesia Post Note  Patient: Frank Castillo  Procedure(s) Performed: Procedure(s) (LRB): LAPAROSCOPIC COLOSTOMY REVERSAL (N/A) SCAR REVISION (N/A) REMOVAL OF GASTROSTOMY TUBE (N/A) REMOVAL PORT-A-CATH HERNIA REPAIR INCISIONAL     Anesthesia Post Evaluation  Last Vitals:  Vitals:   09/04/16 2230 09/05/16 0421  BP: 131/78 132/74  Pulse: 92 78  Resp: 18 18  Temp: 37 C 36.6 C    Last Pain:  Vitals:   09/05/16 0909  TempSrc:   PainSc: 2                  Esmond Hinch S

## 2016-10-26 NOTE — Addendum Note (Signed)
Addendum  created 10/26/16 1311 by Myrtie Soman, MD   Sign clinical note

## 2016-11-04 ENCOUNTER — Other Ambulatory Visit (HOSPITAL_COMMUNITY): Payer: Self-pay | Admitting: Oncology

## 2016-11-16 ENCOUNTER — Other Ambulatory Visit (HOSPITAL_COMMUNITY): Payer: Self-pay | Admitting: Emergency Medicine

## 2016-11-26 ENCOUNTER — Other Ambulatory Visit (HOSPITAL_COMMUNITY): Payer: Self-pay | Admitting: *Deleted

## 2016-11-26 DIAGNOSIS — C090 Malignant neoplasm of tonsillar fossa: Secondary | ICD-10-CM

## 2016-11-27 ENCOUNTER — Encounter (HOSPITAL_COMMUNITY): Payer: BLUE CROSS/BLUE SHIELD | Attending: Hematology & Oncology | Admitting: Oncology

## 2016-11-27 ENCOUNTER — Other Ambulatory Visit (HOSPITAL_COMMUNITY): Payer: Self-pay

## 2016-11-27 ENCOUNTER — Encounter (HOSPITAL_COMMUNITY): Payer: BLUE CROSS/BLUE SHIELD

## 2016-11-27 ENCOUNTER — Encounter (HOSPITAL_COMMUNITY): Payer: Self-pay

## 2016-11-27 VITALS — BP 155/75 | HR 76 | Resp 16 | Ht 70.0 in | Wt 168.0 lb

## 2016-11-27 DIAGNOSIS — C090 Malignant neoplasm of tonsillar fossa: Secondary | ICD-10-CM | POA: Insufficient documentation

## 2016-11-27 DIAGNOSIS — Z923 Personal history of irradiation: Secondary | ICD-10-CM

## 2016-11-27 DIAGNOSIS — L739 Follicular disorder, unspecified: Secondary | ICD-10-CM | POA: Diagnosis present

## 2016-11-27 DIAGNOSIS — C099 Malignant neoplasm of tonsil, unspecified: Secondary | ICD-10-CM | POA: Diagnosis present

## 2016-11-27 DIAGNOSIS — B370 Candidal stomatitis: Secondary | ICD-10-CM | POA: Insufficient documentation

## 2016-11-27 DIAGNOSIS — Z8579 Personal history of other malignant neoplasms of lymphoid, hematopoietic and related tissues: Secondary | ICD-10-CM | POA: Diagnosis not present

## 2016-11-27 DIAGNOSIS — R05 Cough: Secondary | ICD-10-CM | POA: Insufficient documentation

## 2016-11-27 DIAGNOSIS — R112 Nausea with vomiting, unspecified: Secondary | ICD-10-CM | POA: Diagnosis present

## 2016-11-27 DIAGNOSIS — Z95828 Presence of other vascular implants and grafts: Secondary | ICD-10-CM | POA: Insufficient documentation

## 2016-11-27 DIAGNOSIS — Z9221 Personal history of antineoplastic chemotherapy: Secondary | ICD-10-CM | POA: Diagnosis not present

## 2016-11-27 LAB — CBC WITH DIFFERENTIAL/PLATELET
BASOS PCT: 2 %
Basophils Absolute: 0.1 10*3/uL (ref 0.0–0.1)
Eosinophils Absolute: 0.2 10*3/uL (ref 0.0–0.7)
Eosinophils Relative: 3 %
HEMATOCRIT: 40.4 % (ref 39.0–52.0)
Hemoglobin: 13.3 g/dL (ref 13.0–17.0)
LYMPHS ABS: 1 10*3/uL (ref 0.7–4.0)
LYMPHS PCT: 15 %
MCH: 29.8 pg (ref 26.0–34.0)
MCHC: 32.9 g/dL (ref 30.0–36.0)
MCV: 90.4 fL (ref 78.0–100.0)
MONOS PCT: 9 %
Monocytes Absolute: 0.6 10*3/uL (ref 0.1–1.0)
NEUTROS ABS: 4.5 10*3/uL (ref 1.7–7.7)
NEUTROS PCT: 71 %
Platelets: 225 10*3/uL (ref 150–400)
RBC: 4.47 MIL/uL (ref 4.22–5.81)
RDW: 13.5 % (ref 11.5–15.5)
WBC: 6.4 10*3/uL (ref 4.0–10.5)

## 2016-11-27 LAB — COMPREHENSIVE METABOLIC PANEL
ALK PHOS: 46 U/L (ref 38–126)
ALT: 14 U/L — ABNORMAL LOW (ref 17–63)
ANION GAP: 9 (ref 5–15)
AST: 15 U/L (ref 15–41)
Albumin: 4.2 g/dL (ref 3.5–5.0)
BILIRUBIN TOTAL: 0.4 mg/dL (ref 0.3–1.2)
BUN: 15 mg/dL (ref 6–20)
CALCIUM: 9.3 mg/dL (ref 8.9–10.3)
CO2: 28 mmol/L (ref 22–32)
Chloride: 97 mmol/L — ABNORMAL LOW (ref 101–111)
Creatinine, Ser: 0.84 mg/dL (ref 0.61–1.24)
GFR calc Af Amer: >60 mL/min (ref >60)
GFR calc non Af Amer: >60 mL/min (ref >60)
GLUCOSE: 88 mg/dL (ref 65–99)
Potassium: 4.2 mmol/L (ref 3.5–5.1)
Sodium: 134 mmol/L — ABNORMAL LOW (ref 135–145)
TOTAL PROTEIN: 7 g/dL (ref 6.5–8.1)

## 2016-11-27 NOTE — Progress Notes (Signed)
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 Isidore Moos.      08/21/2015 - 09/10/2015 Chemotherapy    Cisplatin every 21 days with XRT.      08/28/2015 Adverse Reaction    Tinnitis, Cisplatin-induced.      09/11/2015 - 09/30/2015 Chemotherapy    Carboplatin/5FU      09/20/2015 - 09/21/2015 Hospital Admission    Intractable nausea/vomiting. Severe Mucositis      10/17/2015 - 10/20/2015 Hospital Admission    Esophageal/oral candidasis. Shingles. Fever. Chemotherapy induced pancytopenia      01/24/2016 PET scan    Resolution of the left tonsillar mass and left neck adenopathy. No findings for residual or locally recurrent tumor, adenopathy or distant metastatic disease.      03/28/2016 - 04/08/2016 Hospital Admission    Admitted with severe abdominal pain. Found to diffuse peritonitis from perforated sigmoid colon requiring colostomy. [facility] to [facility] in [place].             INTERVAL HISTORY:  [name] presents to clinic today for follow-up for history of Stage IVA left tonsil cancer s/p recent colostomy d/t diffuse peritonitis from perforated sigmoid colon in 03/2016. He is also actively working on smoking cessation, currently on Chantix therapy.    He states that he is doing well. On 09/05/16 he underwent colostomy closure, removal of gastrostomy tube and infusion port, repair of incisional hernia. Patient still has some mild dysphagia to certain foods such as bread, but he can swallow almost everything without  difficulty now. He has gained weight since his last visit. He last saw Dr. Benjamine Mola about 2 months ago and had a negative exam. He continues to have ongoing diarrhea since reversal of his colostomy but he states he is managing it with imodium.   ADDITIONAL REVIEW OF SYSTEMS:  Review of Systems  Constitutional: Positive for malaise/fatigue (Energy levels are improving. ). Negative for chills, fever and weight loss.  HENT: Negative.  Negative for sore throat.        Dysphagia  Eyes: Negative.   Respiratory: Negative.  Negative for cough.   Cardiovascular: Negative.  Negative for chest pain and palpitations.  Gastrointestinal: Positive for diarrhea. Negative for abdominal pain, blood in stool, constipation, melena, nausea and vomiting.  Genitourinary: Negative.  Negative for dysuria and hematuria.  Musculoskeletal: Negative.   Skin: Negative for itching and rash.  Neurological: Negative.   Endo/Heme/Allergies: Negative.   Psychiatric/Behavioral: Negative.  Negative for depression. The patient is not nervous/anxious.    Additional ROS per HPI.    PAST MEDICAL & SURGICAL HISTORY:  Past Medical History:  Diagnosis Date  . Cirrhosis ([place])    Followed at [facility]  . Hypertension   . Iron deficiency anemia 05/07/2016  . Portal hypertension ([place])   . Tonsillar mass    left   Past Surgical History:  Procedure Laterality Date  . BIOPSY  07/02/2016   Procedure: BIOPSY;  Surgeon: Daneil Dolin, MD;  [facility];  Service: Endoscopy;;  esophageal  . COLONOSCOPY WITH PROPOFOL N/A 07/02/2016   Procedure: COLONOSCOPY WITH PROPOFOL;  Surgeon: Daneil Dolin, MD;  [facility];  Service: Endoscopy;  Laterality: N/A;  9:45am  . COLOSTOMY TAKEDOWN N/A 09/02/2016   Procedure: LAPAROSCOPIC COLOSTOMY REVERSAL;  Surgeon: Leighton Ruff, MD;  Location: [facility];  Service: General;  Laterality: N/A;  . ESOPHAGOGASTRODUODENOSCOPY (EGD) WITH PROPOFOL N/A 07/02/2016   Procedure:  ESOPHAGOGASTRODUODENOSCOPY (EGD) WITH PROPOFOL;  Surgeon: Daneil Dolin, MD;  [facility];  Service: Endoscopy;  Laterality: N/A;  . HERNIA REPAIR Right   . INCISIONAL HERNIA REPAIR  09/02/2016   Procedure: HERNIA REPAIR INCISIONAL;  Surgeon: Leighton Ruff, MD;  Location: [facility];  Service: General;;  . LAPAROTOMY N/A 03/28/2016   Procedure: EXPLORATORY LAPAROTOMY, SIGMOID COLECTOMY, COLOSTOMY;  Surgeon: Leighton Ruff, MD;  Location: [facility];  Service: General;  Laterality: N/A;  [name] DILATION N/A 07/02/2016   Procedure: Keturah Shavers;  Surgeon: Daneil Dolin, MD;  [facility];  Service: Endoscopy;  Laterality: N/A;  . MOUTH SURGERY    . PORT-A-CATH REMOVAL  09/02/2016   Procedure: REMOVAL PORT-A-CATH;  Surgeon: Leighton Ruff, MD;  Location: [facility];  Service: General;;  . REMOVAL OF GASTROSTOMY TUBE N/A 09/02/2016   Procedure: REMOVAL OF GASTROSTOMY TUBE;  Surgeon: Leighton Ruff, MD;  Location: [facility];  Service: General;  Laterality: N/A;  . TONSILLECTOMY Left 07/09/2015   Procedure: LEFT TONSILLECTOMY;  Surgeon: Leta Baptist, MD;  Location: [place];  Service: ENT;  Laterality: Left;  . TONSILLECTOMY    . Z-PLASTY SCAR REVISION N/A 09/02/2016   Procedure: SCAR REVISION;  Surgeon: Leighton Ruff, MD;  Location: [facility];  Service: General;  Laterality: N/A;      CURRENT MEDICATIONS:  Current Outpatient Prescriptions on File Prior to Visit  Medication Sig Dispense Refill  . cetirizine (ZYRTEC) 10 MG tablet Take 10 mg by mouth at bedtime.     . cholecalciferol (VITAMIN D) 1000 units tablet Take 1,000 Units by mouth daily.    . clotrimazole-betamethasone (LOTRISONE) cream Apply 1 application topically 2 (two) times daily as needed. For rash 30 g 0  . dexlansoprazole (DEXILANT) 60 MG capsule Take 1 capsule (60 mg total) by mouth daily. 30 capsule 11  . diphenhydrAMINE (BENADRYL) 50 MG tablet Take 50 mg by mouth at bedtime as needed for sleep.    Marland Kitchen  escitalopram (LEXAPRO) 20 MG tablet TAKE 1 TABLET BY MOUTH DAILY 30 tablet 5  . Melatonin 10 MG TABS Take 1 tablet by mouth at bedtime.    . RABEprazole (ACIPHEX) 20 MG tablet Take 1 tablet (20 mg total) by mouth daily. 30 tablet 11  . sodium fluoride (FLUORISHIELD) 1.1 % GEL dental gel Instill one drop of gel per tooth space of fluoride tray. Place over teeth for 5 minutes. Remove. Spit out excess. Repeat nightly. 125 mL 2  . varenicline (CHANTIX CONTINUING MONTH PAK) 1 MG tablet Take 1 tablet (1 mg total) by mouth 2 (two) times daily. 60 tablet 4  . vitamin B-12 (CYANOCOBALAMIN) 1000 MCG tablet Take 1,000 mcg by mouth daily.     Current Facility-Administered Medications on File Prior to Visit  Medication Dose Route Frequency Provider Last Rate Last Dose  . 0.9 %  sodium chloride infusion   Intravenous Continuous Kefalas, Thomas S, PA-C      . 0.9 %  sodium chloride  infusion   Intravenous Continuous Kefalas, Manon Hilding, PA-C      . 0.9 %  sodium chloride infusion   Intravenous Continuous Penland, Larene Beach K, MD      . 0.9 %  sodium chloride infusion   Intravenous Continuous Penland, Larene Beach K, MD      . 0.9 %  sodium chloride infusion   Intravenous Continuous Penland, Kelby Fam, MD        ALLERGIES: Allergies  Allergen Reactions  . Bee Venom Swelling  . Other     Opthalmic mycin irritates eyes.     PHYSICAL EXAM:   Vitals:  Vitals:   11/27/16 1407  BP: (!) 155/75  Pulse: 76  Resp: 16    Filed Weights   11/27/16 1407  Weight: 168 lb (76.2 kg)   General: Well nourished male in no acute distress. Unaccompanied today.   HEENT: Head is normocephalic. Pupils equal and reactive to light. Conjunctivae clear without exudate. Sclerae anicteric. Oral mucosa is moist. Lymph: No cervical, supraclavicular, or infraclavicular lymphadenopathy noted on palpation.  Neck: No palpable masses.  Cardiovascular: Regular rate & regular rhythm.  Respiratory: Clear to auscultation bilaterally.  Chest expansion symmetric without accessory muscle use. Breathing non-labored.  GI: Soft, non-tender, nondistended abdomen. Normoactive bowel sounds. Midline surgical scar. Neuro: AAOx3. No focal deficits. Steady gait.  Psych: Normal mood and affect for situation. Extremities: No edema. No weakness. Skin: Warm and dry.    LABORATORY DATA: CBC Latest Ref Rng & Units 11/27/2016 09/05/2016 09/04/2016  WBC 4.0 - 10.5 K/uL 6.4 6.1 7.8  Hemoglobin 13.0 - 17.0 g/dL 13.3 10.0(L) 10.5(L)  Hematocrit 39.0 - 52.0 % 40.4 30.2(L) 31.4(L)  Platelets 150 - 400 K/uL 225 179 163   CMP Latest Ref Rng & Units 09/05/2016 09/04/2016 09/03/2016  Glucose 65 - 99 mg/dL 107(H) 102(H) 101(H)  BUN 6 - 20 mg/dL 8 8 13   Creatinine 0.61 - 1.24 mg/dL 0.67 0.62 0.94  Sodium 135 - 145 mmol/L 134(L) 134(L) 136  Potassium 3.5 - 5.1 mmol/L 3.8 3.9 4.9  Chloride 101 - 111 mmol/L 100(L) 101 102  CO2 22 - 32 mmol/L 28 28 29   Calcium 8.9 - 10.3 mg/dL 8.7(L) 8.5(L) 8.6(L)  Total Protein 6.5 - 8.1 g/dL - - -  Total Bilirubin 0.3 - 1.2 mg/dL - - -  Alkaline Phos 38 - 126 U/L - - -  AST 15 - 41 U/L - - -  ALT 17 - 63 U/L - - -   Results for CAREY, JOHNDROW (MRN [id number])   Ref. Range 07/06/2016 09:55  TSH Latest Ref Range: 0.350 - 4.500 uIU/mL 1.887     DIAGNOSTIC IMAGING:  Restaging PET scan: 01/24/16    Most recent: CT abd/pelvis-03/28/16      ASSESSMENT & PLAN:  [name] is a pleasant 66 y.o. male with history of Stage IVA (T2N2bM0) squamous cell carcinoma of left tonsil, HPV+; diagnosed in 07/2015;  treated with concurrent chemoradiation with Cisplatin initially (stopped 09/10/15 d/t tinnitus), then transitioned to [id number], which was complicated by hospital admission for intractable N&V and severe mucositis.  He completed treatment on 09/30/15.  PET scan on 01/24/16 showed resolution of left tonsillar mass and left neck adenopathy.  Patient presents to cancer center for routine follow-up.    Stage IVA left  tonsil cancer:  -Clinically, he is without evidence of disease recurrence based on physical exam/diagnostic imaging. No need for repeat imaging, unless clinically indicated.  -Continue follow up with ENT and rad-onc as scheduled.  Dispo:  -Return to cancer center in 6 months for continued follow-up with labs, sooner should he develop any new symptoms.  Orders Placed This Encounter  Procedures  . CBC with Differential    Standing Status:   Future    Standing Expiration Date:   11/27/2017  . Comprehensive metabolic panel    Standing Status:   Future    Standing Expiration Date:   11/27/2017       NCCN guidelines for surveillance of Head and Neck cancer recommends:  A. H+P every 1-3 months for year 1  B. H+P every 2-6 months for year 2  C. H+P every 4-8 months for years 3-5  D. H+P every year for years greater than 5  E. Imaging only as clinically indicated following baseline imaging study within 6 months of completion of therapy.   F. Chest imaging as clinically indicated for patients with smoking history.  G. Further re-imaging as indicated based on worrisome or equivocal signs/symptoms, smoking history, and areas inaccessible to clinical examination.  H. Routine annual imaging may be indicated in areas difficult to visualize on exam.  I. TSH every 6-12 months if neck irradiated.  J. Dental evaluation   1. Recommended for oral cavity and sites exposed to significant intraoral radiation treatment.  K. 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  L. Supportive care and rehabilitation   1. Speech/hearing and swallowing evaluation and rehabilitation as clinically indicated.   2. Nutritional evaluation and rehabilitation as clinically indicated until nutritional status is stabilized.   3. Ongoing surveillance for depression   4. Smoking cessation and alcohol counseling as clinically indicated.  M. For response assessment immediately after chemoradiation or RT (see  FOLL-A 2 of 2).  N. Integration of survivorship care and care plan within 1 year, complementary to ongoing involvement from a head and neck oncologist.      Physical Exam

## 2016-12-25 ENCOUNTER — Other Ambulatory Visit (HOSPITAL_COMMUNITY): Payer: Self-pay | Admitting: Adult Health

## 2016-12-25 DIAGNOSIS — C099 Malignant neoplasm of tonsil, unspecified: Secondary | ICD-10-CM

## 2016-12-25 DIAGNOSIS — F17200 Nicotine dependence, unspecified, uncomplicated: Secondary | ICD-10-CM

## 2017-01-22 ENCOUNTER — Ambulatory Visit (INDEPENDENT_AMBULATORY_CARE_PROVIDER_SITE_OTHER): Payer: BLUE CROSS/BLUE SHIELD | Admitting: Family Medicine

## 2017-01-22 ENCOUNTER — Encounter: Payer: Self-pay | Admitting: Family Medicine

## 2017-01-22 VITALS — BP 118/78 | HR 84 | Temp 97.6°F | Resp 16 | Ht 70.0 in | Wt 175.0 lb

## 2017-01-22 DIAGNOSIS — Z87898 Personal history of other specified conditions: Secondary | ICD-10-CM

## 2017-01-22 DIAGNOSIS — F419 Anxiety disorder, unspecified: Secondary | ICD-10-CM | POA: Diagnosis not present

## 2017-01-22 DIAGNOSIS — Z23 Encounter for immunization: Secondary | ICD-10-CM

## 2017-01-22 DIAGNOSIS — K439 Ventral hernia without obstruction or gangrene: Secondary | ICD-10-CM

## 2017-01-22 DIAGNOSIS — F1011 Alcohol abuse, in remission: Secondary | ICD-10-CM | POA: Insufficient documentation

## 2017-01-22 DIAGNOSIS — F324 Major depressive disorder, single episode, in partial remission: Secondary | ICD-10-CM | POA: Diagnosis not present

## 2017-01-22 DIAGNOSIS — K7030 Alcoholic cirrhosis of liver without ascites: Secondary | ICD-10-CM | POA: Diagnosis not present

## 2017-01-22 NOTE — Progress Notes (Signed)
Patient ID: Frank Castillo, male    DOB: May 04, 1951, 66 y.o.   MRN: [id number]  Chief Complaint  Patient presents with  . Hernia    patient suspects he might have a hernia following bowel reconstruction- states colon doctor will not address.  . Anxiety    Allergies Bee venom and Other  Subjective:   Frank Castillo is a 66 y.o. male who presents to [facility] today.  HPI Here to establish care.  Sees Dr. Benjamine Mola for cancer screening at [facility]. Is followed by PET scans and surveillance. Currently cancer free.  Reports that he believe that he has a hernia in his abdomen. Has been there for a while and does not believe that it has gotten worse. Has not had pain in the area but it is concerning to him b/c of the size of the hernia. Does not like the way that the hernia looks. Has never had an episode where he could not reduce the hernia. The area has never gotten red or painful. Has asked his GI surgeon about this and was told that he needed to be seen by a hernia specialist. Is not sure that he would want surgery, but would like to have it evaluated and talk to a surgeon.   Has been on lexapro for over a year for anxiety and depression related to cancer. Feels like mood is much better and does not feel so anxious. Is currently still taking his chantix for smoking cessation. Still has urges to smoke. Is an alcoholic who last had a drink about five years ago. Has been sober. 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 Reports is followed at [facility] for this and is not on any medications for the cirrhosis. Would be interested in decreasing the lexapro.   Anxiety  Presents for initial visit. Onset was 1 to 5 years ago. The problem has been resolved (feels like it is gone most of the time. ). Symptoms include nervous/anxious behavior. Patient reports no chest pain, compulsions, confusion, decreased concentration, depressed mood, dizziness, dry mouth, excessive worry, feeling of choking,  hyperventilation, irritability, malaise, muscle tension, nausea, obsessions, palpitations, panic, restlessness, shortness of breath or suicidal ideas. Primary symptoms comment: Can get anxious at times depending on life stressors. . Symptoms occur rarely. The severity of symptoms is mild. Exacerbated by: just life stress/health issues/concerns. The quality of sleep is good. Nighttime awakenings: occasional.   Risk factors include prior traumatic experience and recent illness. His past medical history is significant for anxiety/panic attacks and depression. Past treatments include SSRIs. The treatment provided significant relief. Compliance with prior treatments has been good.  Drug / Alcohol Assessment  Pertinent negatives include no confusion, delusions, hallucinations, intoxication, loss of consciousness, seizures, self-injury, somnolence, violence or weakness. Primary symptoms comment: Can get anxious at times depending on life stressors. . This is a chronic problem. The current episode started more than 1 year ago. The problem has been resolved since onset. Suspected agents include alcohol. Pertinent negatives include no bowel incontinence, injury, nausea or vomiting. Past treatments include Alcoholics Anonymous and outpatient rehab. The treatment provided significant relief. His past medical history is significant for addiction treatment, a mental illness and a withdrawal syndrome.    Past Medical History:  Diagnosis Date  . Cancer ([place])   . Cirrhosis ([place])    Followed at [facility]  . GERD (gastroesophageal reflux disease)   . History of bowel resection   . History of tonsillectomy    Dr. Benjamine Mola, 06/2015  .  Hypertension   . Iron deficiency anemia 05/07/2016  . Portal hypertension ([place])   . Substance abuse    alcohol  . 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 Tonsillar mass    left    Past Surgical History:  Procedure Laterality Date  . BIOPSY  07/02/2016   Procedure: BIOPSY;  Surgeon: Daneil Dolin, MD;  [facility];  Service: Endoscopy;;  esophageal  . COLON SURGERY    . COLONOSCOPY WITH PROPOFOL N/A 07/02/2016   Procedure: COLONOSCOPY WITH PROPOFOL;  Surgeon: Daneil Dolin, MD;  [facility];  Service: Endoscopy;  Laterality: N/A;  9:45am  . COLOSTOMY TAKEDOWN N/A 09/02/2016   Procedure: LAPAROSCOPIC COLOSTOMY REVERSAL;  Surgeon: Leighton Ruff, MD;  [facility];  Service: General;  Laterality: N/A;  . ESOPHAGOGASTRODUODENOSCOPY (EGD) WITH PROPOFOL N/A 07/02/2016   Procedure: ESOPHAGOGASTRODUODENOSCOPY (EGD) WITH PROPOFOL;  Surgeon: Daneil Dolin, MD;  [facility];  Service: Endoscopy;  Laterality: N/A;  . HERNIA REPAIR Right   . INCISIONAL HERNIA REPAIR  09/02/2016   Procedure: HERNIA REPAIR INCISIONAL;  Surgeon: Leighton Ruff, MD;  [facility];  Service: General;;  . LAPAROTOMY N/A 03/28/2016   Procedure: EXPLORATORY LAPAROTOMY, SIGMOID COLECTOMY, COLOSTOMY;  Surgeon: Leighton Ruff, MD;  [facility];  Service: General;  Laterality: N/A;  [name] DILATION N/A 07/02/2016   Procedure: Keturah Shavers;  Surgeon: Daneil Dolin, MD;  [facility];  Service: Endoscopy;  Laterality: N/A;  . MOUTH SURGERY    . PORT-A-CATH REMOVAL  09/02/2016   Procedure: REMOVAL PORT-A-CATH;  Surgeon: Leighton Ruff, MD;  [facility];  Service: General;;  . REMOVAL OF GASTROSTOMY TUBE N/A 09/02/2016   Procedure: REMOVAL OF GASTROSTOMY TUBE;  Surgeon: Leighton Ruff, MD;  [facility];  Service: General;  Laterality: N/A;  . TONSILLECTOMY Left 07/09/2015   Procedure: LEFT TONSILLECTOMY;  Surgeon: Leta Baptist, MD;  Location: [place];  Service: ENT;  Laterality: Left;  . TONSILLECTOMY    . Z-PLASTY SCAR REVISION N/A 09/02/2016   Procedure: SCAR REVISION;  Surgeon: Leighton Ruff, MD;  [facility];  Service: General;  Laterality: N/A;    Family History  Problem Relation Age of Onset  . Macular degeneration Mother   .  Alcoholism Father   . Diabetes Maternal Aunt   . Diabetes Maternal Uncle      Social History   Social History  . Marital status: Single    Spouse name: N/A  . Number of children: 0  . Years of education: N/A   Social History Main Topics  . Smoking status: Current Every Day Smoker    Packs/day: 0.50    Types: Cigarettes  . Smokeless tobacco: Never Used  . Alcohol use No     Comment: former abuse- quit 09/2011  . Drug use: No  . Sexual activity: Not Asked   Other Topics Concern  . None   Social History Narrative   Divorced.   No children.   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   Currently smoking approximately one pack per day.   Has been smoking off/on since age 89.    Outpatient Medications Prior to Visit  Medication Sig Dispense Refill  . cetirizine (ZYRTEC) 10 MG tablet Take 10 mg by mouth at bedtime.     . CHANTIX 1 MG tablet TAKE 1 TABLET BY MOUTH TWICE DAILY 60 tablet 0  . cholecalciferol (VITAMIN  D) 1000 units tablet Take 1,000 Units by mouth daily.    . clotrimazole-betamethasone (LOTRISONE) cream Apply 1 application topically 2 (two) times daily as needed. For rash 30 g 0  . diphenhydrAMINE (BENADRYL) 50 MG tablet Take 50 mg by mouth at bedtime as needed for sleep.    Marland Kitchen escitalopram (LEXAPRO) 20 MG tablet TAKE 1 TABLET BY MOUTH DAILY 30 tablet 5  . Melatonin 10 MG TABS Take 1 tablet by mouth at bedtime.    . RABEprazole (ACIPHEX) 20 MG tablet Take 1 tablet (20 mg total) by mouth daily. 30 tablet 11  . sodium fluoride (FLUORISHIELD) 1.1 % GEL dental gel Instill one drop of gel per tooth space of fluoride tray. Place over teeth for 5 minutes. Remove. Spit out excess. Repeat nightly. 125 mL 2  . vitamin B-12 (CYANOCOBALAMIN) 1000 MCG tablet Take 1,000 mcg by mouth daily.    Marland Kitchen dexlansoprazole (DEXILANT) 60 MG capsule Take 1 capsule (60 mg total) by mouth daily. (Patient not taking: Reported on 01/22/2017) 30 capsule 11   Facility-Administered  Medications Prior to Visit  Medication Dose Route Frequency Provider Last Rate Last Dose  . 0.9 %  sodium chloride infusion   Intravenous Continuous Kefalas, Manon Hilding, PA-C      . 0.9 %  sodium chloride infusion   Intravenous Continuous Kefalas, Thomas S, PA-C      . 0.9 %  sodium chloride infusion   Intravenous Continuous Penland, Larene Beach K, MD      . 0.9 %  sodium chloride infusion   Intravenous Continuous Penland, Larene Beach K, MD      . 0.9 %  sodium chloride infusion   Intravenous Continuous Penland, Kelby Fam, MD        Review of Systems  Constitutional: Negative for irritability.  Respiratory: Negative for shortness of breath.   Cardiovascular: Negative for chest pain and palpitations.  Gastrointestinal: Negative for bowel incontinence, nausea and vomiting.  Neurological: Negative for dizziness, seizures, loss of consciousness and weakness.  Psychiatric/Behavioral: Negative for confusion, decreased concentration, hallucinations, self-injury and suicidal ideas. The patient is nervous/anxious.      Objective:   BP 118/78 (BP Location: Left Arm, Patient Position: Sitting, Cuff Size: Normal)   Pulse 84   Temp 97.6 F (36.4 C) (Other (Comment))   Resp 16   Ht 5\' 10"  (1.778 m)   Wt 175 lb (79.4 kg)   SpO2 97%   BMI 25.11 kg/m   Physical Exam  Constitutional: He is oriented to person, place, and time. Vital signs are normal. He appears well-developed and well-nourished.  Non-toxic appearance. He does not have a sickly appearance. No distress.  Non-icteric. No skin stigmata consistent with liver disease observed.   HENT:  Head: Normocephalic.  Nose: Nose normal.  Mouth/Throat: Oropharynx is clear and moist. No oropharyngeal exudate.  Voice is hoarse, but reports is normal voice since surgery/radiation/chemotherapy.   Eyes: Pupils are equal, round, and reactive to light. Conjunctivae and EOM are normal.  Neck: Normal range of motion. Neck supple. No tracheal deviation present. No  thyromegaly present.  Cardiovascular: Normal rate, regular rhythm, normal heart sounds and intact distal pulses.  Exam reveals no gallop and no friction rub.   No murmur heard. Pulmonary/Chest: No respiratory distress. He has no wheezes. He has no rales. He exhibits no tenderness.  Abdominal: Soft. Bowel sounds are normal. He exhibits no distension and no mass. There is no tenderness. There is no rebound and no guarding. A hernia is present.  Greater than 10 cm large, right sided lower abdominal hernia. Fascial defect palpable. Reducible. NTTP.   NO HSM.   Musculoskeletal: Normal range of motion. He exhibits no edema.  Lymphadenopathy:    He has no cervical adenopathy.  Neurological: He is alert and oriented to person, place, and time. No cranial nerve deficit. Coordination normal.  Skin: Skin is warm and dry. No erythema. No pallor.  Psychiatric: He has a normal mood and affect. His behavior is normal. Judgment and thought content normal.     IAssessment and Plan   1. Need for immunization against influenza  - Flu Vaccine QUAD 36+ mos IM  2. Hernia of abdominal wall Counseled regarding worrisome s/s of hernia. Voiced understanding. If develops will seek medical help.  - Ambulatory referral to General Surgery  3. Anxiety Improved. Decrease Lexapro to 10 mg po qd.  Suicide/mood precautions given. Voiced understanding.  Call with questions or concerns or changes in mood. Continue anxiety control measures.   4. Depression, major, single episode, in partial remission [facility]) Patient counseled in detail regarding the risks of medication. Told to call or return to clinic if develop any worrisome signs or symptoms. Patient voiced understanding.  Decrease lexapro as discussed.   5. Alcoholic cirrhosis of liver without ascites ([place]) Request records as directed. Abstinence discussed and congratulated.   6. History of alcohol abuse and tobacco abuse disorder.  Continue Chantix. Patient  counseled in detail regarding the risks of medication. Told to call or return to clinic if develop any worrisome signs or symptoms. Patient voiced understanding.  Tobacco cessation and triggers discussed. Will call if problems.    Records reviewed. Has has pneumovax. Will follow up for CPE and discused health maintenance in more detail. Agreed.  Caren Macadam, MD 01/22/2017

## 2017-02-04 ENCOUNTER — Other Ambulatory Visit (HOSPITAL_COMMUNITY): Payer: Self-pay | Admitting: Adult Health

## 2017-02-04 DIAGNOSIS — C099 Malignant neoplasm of tonsil, unspecified: Secondary | ICD-10-CM

## 2017-02-04 DIAGNOSIS — F17200 Nicotine dependence, unspecified, uncomplicated: Secondary | ICD-10-CM

## 2017-02-04 NOTE — Telephone Encounter (Signed)
Yes, I would prefer his PCP take over if she feels comfortable continuing the Chantix, where he can be more closely monitored.  Congratulate him on becoming smoke free! That's amazing news!    Thanks so much, Sharyn Lull! gwd

## 2017-02-04 NOTE — Telephone Encounter (Signed)
Sharyn Lull, will you give Frank Castillo a call and see how his smoking cessation is going?  He has been on Chantix for 6 months and I'd like to stop it now, if able.  The last I heard from him he was smoking ~1 cigarette per day, so just wanted to get an update from him.  There isn't great data to continue Chantix beyond 6 months.   gwd

## 2017-02-04 NOTE — Telephone Encounter (Signed)
Notified patient to obtain prescription from his PCP so he can be monitored more closely. He verbalized understanding

## 2017-02-04 NOTE — Telephone Encounter (Signed)
Patient states he has not smoked in 6 weeks. He does still have thoughts about smoking once in a while. He had to get a new PCP and she has recommended he continue the Chantix longer due to these thoughts. He states he will ask her for the refill if you want him to. Told him I would talk with you and call him back. He verbalized understanding.

## 2017-02-08 ENCOUNTER — Other Ambulatory Visit: Payer: Self-pay

## 2017-02-08 DIAGNOSIS — C099 Malignant neoplasm of tonsil, unspecified: Secondary | ICD-10-CM

## 2017-02-08 DIAGNOSIS — F17200 Nicotine dependence, unspecified, uncomplicated: Secondary | ICD-10-CM

## 2017-02-08 MED ORDER — VARENICLINE TARTRATE 1 MG PO TABS: 1.0000 mg | ORAL_TABLET | Freq: Two times a day (BID) | ORAL | 1 refills | Status: DC

## 2017-02-15 ENCOUNTER — Encounter: Payer: Self-pay | Admitting: Family Medicine

## 2017-02-16 ENCOUNTER — Encounter: Payer: Self-pay | Admitting: General Surgery

## 2017-02-16 ENCOUNTER — Ambulatory Visit (INDEPENDENT_AMBULATORY_CARE_PROVIDER_SITE_OTHER): Payer: BLUE CROSS/BLUE SHIELD | Admitting: General Surgery

## 2017-02-16 VITALS — BP 129/76 | HR 81 | Temp 97.8°F | Ht 70.0 in | Wt 176.0 lb

## 2017-02-16 DIAGNOSIS — K439 Ventral hernia without obstruction or gangrene: Secondary | ICD-10-CM

## 2017-02-16 NOTE — Patient Instructions (Signed)
Will refer to [facility] for Hernia Specialist.

## 2017-02-16 NOTE — Progress Notes (Signed)
[facility] History and Physical  Reason for Referral: Ventral hernia  Referring Physician: Caren Macadam, MD   Frank Castillo is an 66 y.o. male.  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 Marcello Moores 08/2016.  Frank Castillo saw Dr. Marcello Moores about 1 month later and she recommended that Frank Castillo see a hernia specialist regarding future repair.    Frank Castillo has been establishing care with Dr. Mannie Stabile and discussed that this hernia is becoming larger and causing him more issues with regards to his daily life.  Frank Castillo has daily BMs and has no issues with nausea or vomiting, but Frank Castillo does report that the hernia is starting to protrude more and becoming more aggravating for him while attempting to exercise and cosmetically.    Frank Castillo denies the hernia ever being incarcerated or other signs of obstruction.   Past Medical History:  Diagnosis Date  . Cancer ([place])   . Cirrhosis ([place])    Followed at [facility]  . GERD (gastroesophageal reflux disease)   . History of bowel resection   . History of tonsillectomy    Dr. Benjamine Mola, 06/2015  . Hypertension   . Iron deficiency anemia 05/07/2016  . Portal hypertension ([place])   . Substance abuse    alcohol  . 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 Tonsillar mass    left    Past Surgical History:  Procedure Laterality Date  . BIOPSY  07/02/2016   Procedure: BIOPSY;  Surgeon: Daneil Dolin, MD;  [facility];  Service: Endoscopy;;  esophageal  . COLON SURGERY    . COLONOSCOPY WITH PROPOFOL N/A 07/02/2016   Procedure: COLONOSCOPY WITH PROPOFOL;  Surgeon: Daneil Dolin, MD;  [facility];  Service: Endoscopy;  Laterality: N/A;  9:45am  . COLOSTOMY TAKEDOWN  N/A 09/02/2016   Procedure: LAPAROSCOPIC COLOSTOMY REVERSAL;  Surgeon: Leighton Ruff, MD;  [facility];  Service: General;  Laterality: N/A;  . ESOPHAGOGASTRODUODENOSCOPY (EGD) WITH PROPOFOL N/A 07/02/2016   Procedure: ESOPHAGOGASTRODUODENOSCOPY (EGD) WITH PROPOFOL;  Surgeon: Daneil Dolin, MD;  [facility];  Service: Endoscopy;  Laterality: N/A;  . HERNIA REPAIR Right   . INCISIONAL HERNIA REPAIR  09/02/2016   Procedure: HERNIA REPAIR INCISIONAL;  Surgeon: Leighton Ruff, MD;  [facility];  Service: General;;  . LAPAROTOMY N/A 03/28/2016   Procedure: EXPLORATORY LAPAROTOMY, SIGMOID COLECTOMY, COLOSTOMY;  Surgeon: Leighton Ruff, MD;  [facility];  Service: General;  Laterality: N/A;  [name] DILATION N/A 07/02/2016   Procedure: Keturah Shavers;  Surgeon: Daneil Dolin, MD;  [facility];  Service: Endoscopy;  Laterality: N/A;  . MOUTH SURGERY    . PORT-A-CATH REMOVAL  09/02/2016   Procedure: REMOVAL PORT-A-CATH;  Surgeon: Leighton Ruff, MD;  [facility];  Service: General;;  . REMOVAL OF GASTROSTOMY TUBE N/A 09/02/2016   Procedure: REMOVAL OF GASTROSTOMY TUBE;  Surgeon: Leighton Ruff, MD;  [facility];  Service: General;  Laterality: N/A;  . TONSILLECTOMY Left 07/09/2015   Procedure: LEFT TONSILLECTOMY;  Surgeon: Leta Baptist, MD;  Location: [place];  Service: ENT;  Laterality: Left;  . TONSILLECTOMY    . Z-PLASTY SCAR REVISION N/A 09/02/2016   Procedure: SCAR  REVISION;  Surgeon: Leighton Ruff, MD;  [facility];  Service: General;  Laterality: N/A;    Family History  Problem Relation Age of Onset  . Macular degeneration Mother   . Alcoholism Father   . Diabetes Maternal Aunt   . Diabetes Maternal Uncle     Social History:  reports that Frank Castillo has quit smoking. His smoking use included Cigarettes. Frank Castillo has a 15.00 pack-year smoking history. Frank Castillo has never used smokeless tobacco. Frank Castillo reports that Frank Castillo does not drink alcohol or use  drugs.  Allergies:  Allergies  Allergen Reactions  . Bee Venom Swelling  . Other     Opthalmic mycin irritates eyes.     Medications: I have reviewed the patient's current medications.   ROS:  Pertinent items noted in HPI and remainder of comprehensive ROS otherwise negative.  Blood pressure 129/76, pulse 81, temperature 97.8 F (36.6 C), height 5\' 10"  (1.778 m), weight 176 lb (79.8 kg). Physical Exam  Constitutional: Frank Castillo is oriented to person, place, and time and well-developed, well-nourished, and in no distress.  HENT:  Head: Normocephalic and atraumatic.  Eyes: Pupils are equal, round, and reactive to light.  Cardiovascular: Normal rate and regular rhythm.   Pulmonary/Chest: Effort normal and breath sounds normal.  Abdominal: Soft. Frank Castillo exhibits no distension. There is no tenderness. There is no rebound and no guarding.  Midline scar from secondary intention, extends approximately 6cm caudal to umbilicus and 3cm wide at largest point, LLQ ostomy scar, right of umbilicus 3-00PQ hernia defect, easily reducible  Musculoskeletal: Normal range of motion.  Neurological: Frank Castillo is alert and oriented to person, place, and time.  Skin: Skin is warm and dry.  Psychiatric: Mood, memory, affect and judgment normal.     Assessment/Plan: Frank Castillo is a 66 yo male with multiple medical issues that seem to be well controlled at this time including cirrhosis from prior alcohol abuse who has a ventral hernia related to an emergency sigmoidectomy with end colostomy for perforated diverticulitis.  Frank Castillo had his colostomy reversed and attempts at primary repair which have failed. Frank Castillo has a large hernia defect and scar at his midline.   -Discussed with the patient given the large defect Frank Castillo would definitively need a mesh, but Frank Castillo could also potentially need more extensive repair with at least unilateral component separation to aid in closure.  It would also be reasonable to remove this scar during his  procedure.  I would like for him to discuss his options with a hernia specialist and see if Frank Castillo would be a candidate for component separation or if a mesh repair would be sufficient.  I have referred him to Dr. Alvan Dame at [facility] for opinion and possible repair.   Virl Cagey 02/16/2017, 2:47 PM

## 2017-03-01 ENCOUNTER — Encounter: Payer: Self-pay | Admitting: Internal Medicine

## 2017-03-12 NOTE — Progress Notes (Signed)
Patient ID: Frank Castillo, male    DOB: 1951/03/24, 65 y.o.   MRN: [id number]  Chief Complaint  Patient presents with  . Annual Exam    Allergies Bee venom and Other  Subjective:   Frank Castillo is a 66 y.o. male who presents to [facility] today.  HPI Here for CPE. Reports that is doing well. Has been feeling good. Enjoys reading. Has been feeling good and anxiety is controlled. Tried to decrease the lexapro to 10 mg a day but then was anxious and felt stressed. Keeps regularly scheduled visits with GI, ENT, Dermatology, and Oncology. Is not on cholesterol medication or medications for BP.    Past Medical History:  Diagnosis Date  . Cancer ([place])   . Cirrhosis ([place])    Followed at [facility]  . GERD (gastroesophageal reflux disease)   . History of bowel resection   . History of tonsillectomy    Dr. Benjamine Mola, 06/2015  . Hypertension   . Iron deficiency anemia 05/07/2016  . Melanoma ([place])    followed by Dr. Cleophas Dunker  . Portal hypertension ([place])   . Substance abuse ([place])    alcohol  . 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 Tonsillar mass    left    Past Surgical History:  Procedure Laterality Date  . BIOPSY  07/02/2016   Procedure: BIOPSY;  Surgeon: Daneil Dolin, MD;  [facility];  Service: Endoscopy;;  esophageal  . COLON SURGERY    . COLONOSCOPY WITH PROPOFOL N/A 07/02/2016   Procedure: COLONOSCOPY WITH PROPOFOL;  Surgeon: Daneil Dolin, MD;  [facility];  Service: Endoscopy;  Laterality: N/A;  9:45am  . COLOSTOMY TAKEDOWN N/A 09/02/2016   Procedure: LAPAROSCOPIC COLOSTOMY REVERSAL;  Surgeon: Leighton Ruff, MD;  Location: [facility] ORS;  Service: General;  Laterality: N/A;  . ESOPHAGOGASTRODUODENOSCOPY (EGD) WITH PROPOFOL N/A 07/02/2016   Procedure: ESOPHAGOGASTRODUODENOSCOPY (EGD) WITH PROPOFOL;  Surgeon: Daneil Dolin, MD;  [facility];  Service: Endoscopy;  Laterality: N/A;  . HERNIA REPAIR Right   . INCISIONAL HERNIA  REPAIR  09/02/2016   Procedure: HERNIA REPAIR INCISIONAL;  Surgeon: Leighton Ruff, MD;  [facility]: [facility];  Service: General;;  . LAPAROTOMY N/A 03/28/2016   Procedure: EXPLORATORY LAPAROTOMY, SIGMOID COLECTOMY, COLOSTOMY;  Surgeon: Leighton Ruff, MD;  [facility];  Service: General;  Laterality: N/A;  Frank Castillo DILATION N/A 07/02/2016   Procedure: Keturah Shavers;  Surgeon: Daneil Dolin, MD;  [facility];  Service: Endoscopy;  Laterality: N/A;  . MOUTH SURGERY    . PORT-A-CATH REMOVAL  09/02/2016   Procedure: REMOVAL PORT-A-CATH;  Surgeon: Leighton Ruff, MD;  [facility];  Service: General;;  . REMOVAL OF GASTROSTOMY TUBE N/A 09/02/2016   Procedure: REMOVAL OF GASTROSTOMY TUBE;  Surgeon: Leighton Ruff, MD;  [facility];  Service: General;  Laterality: N/A;  . TONSILLECTOMY Left 07/09/2015   Procedure: LEFT TONSILLECTOMY;  Surgeon: Leta Baptist, MD;  Location: [place];  Service: ENT;  Laterality: Left;  . TONSILLECTOMY    . Z-PLASTY SCAR REVISION N/A 09/02/2016   Procedure: SCAR REVISION;  Surgeon: Leighton Ruff, MD;  [facility];  Service: General;  Laterality: N/A;    Family History  Problem Relation Age of Onset  . Macular degeneration Mother   . Dementia Mother   . Alcoholism Father   . Diabetes Maternal Aunt   . Diabetes Maternal Uncle   . Mental illness Sister  Social History   Social History  . Marital status: Single    Spouse name: N/A  . Number of children: 0  . Years of education: N/A   Social History Main Topics  . Smoking status: Former Smoker    Packs/day: 0.50    Years: 30.00    Types: Cigarettes    Quit date: 11/22/2016  . Smokeless tobacco: Never Used  . Alcohol use No     Comment: former abuse- quit 09/2011  . Drug use: No  . Sexual activity: Not Asked   Other Topics Concern  . None   Social History Narrative   Divorced.   No children.   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    Has been smoking off/on since age 89, quit smoking 11/2016.    Exercises minimally   Eats all food groups.   Wears seatbelt.   Mother lives with him, has dementia.   Attends AA meetings.     Review of Systems  Constitutional: Negative for activity change, appetite change, chills, diaphoresis, fatigue, fever and unexpected weight change.  HENT: Positive for trouble swallowing. Negative for dental problem, hearing loss, nosebleeds, postnasal drip, sneezing and voice change.        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 Voice is unchanged since surgery. Still has some trouble swallowing and is followed by Dr. Sydell Axon. Hearing ok.   Eyes: Negative for photophobia and visual disturbance.  Respiratory: Negative for cough, choking, chest tightness, shortness of breath, wheezing and stridor.   Cardiovascular: Negative for chest pain, palpitations and leg swelling.  Gastrointestinal: Negative for abdominal distention, abdominal pain, anal bleeding, blood in stool, constipation, diarrhea, nausea and vomiting.  Endocrine: Negative for cold intolerance, heat intolerance, polydipsia, polyphagia and polyuria.  Genitourinary: Negative for decreased urine volume, discharge, dysuria, enuresis, flank pain, frequency, hematuria, penile pain, testicular pain and urgency.  Musculoskeletal: Negative for arthralgias, back pain, joint swelling, myalgias, neck pain and neck stiffness.  Skin: Positive for rash.       Has had a rash on back for months that itches. Has not put anything on it.   Neurological: Negative for dizziness, tremors, seizures, syncope, weakness, light-headedness, numbness and headaches.  Hematological: Negative for adenopathy. Does not bruise/bleed easily.  Psychiatric/Behavioral: Negative for agitation, behavioral problems, decreased concentration, dysphoric mood, sleep disturbance and suicidal ideas. The patient is not  nervous/anxious and is not hyperactive.        Tried to cut back on the lexapro and experienced a lot of anxiety. Went back on the regular dose and lexapro and feels better.      Objective:   BP 136/80 (BP Location: Left Arm, Patient Position: Sitting, Cuff Size: Normal)   Pulse 88   Temp 97.6 F (36.4 C) (Other (Comment))   Resp 16   Ht 5\' 10"  (1.778 m)   Wt 178 lb 12 oz (81.1 kg)   SpO2 98%   BMI 25.65 kg/m   Physical Exam  Constitutional: He is oriented to person, place, and time. He appears well-developed and well-nourished.  HENT:  Head: Normocephalic and atraumatic.  Eyes: Pupils are equal, round, and reactive to light. EOM are normal.  Neck: Normal range of motion. Neck supple. No thyromegaly present.  Cardiovascular: Normal rate, regular rhythm and normal heart sounds.   Pulses:      Dorsalis pedis pulses are 2+ on the right side, and 2+  on the left side.  Pulmonary/Chest: Effort normal and breath sounds normal.  Musculoskeletal: He exhibits no edema.  Neurological: He is alert and oriented to person, place, and time. No cranial nerve deficit.  Skin: Skin is warm, dry and intact. Rash noted. No abrasion, no bruising, no burn and no petechiae noted. Rash is macular.     Pink macules, diffusely covering back, scattered; associated xerosis, area on arm bilaterally too.   Psychiatric: He has a normal mood and affect. His behavior is normal. Thought content normal.  Vitals reviewed.    Assessment and Plan   1. Well adult exam Age appropriate anticipatory guidance given.  - COMPLETE METABOLIC PANEL WITH GFR - Lipid panel  2. Anxiety, stable Stable. Continue medications. - escitalopram (LEXAPRO) 20 MG tablet; Take 1 tablet (20 mg total) by mouth daily.  Dispense: 90 tablet; Refill: 3 Patient counseled in detail regarding the risks of medication. Told to call or return to clinic if develop any worrisome signs or symptoms. Patient voiced understanding.  Suicide risks  evaluated and documented in note if present or in the area below.  Patient does not have/denies the following risks: previous suicide attempts, recent loss of a loved one, or severe hopelessness.  Patient has protective factors of family and community support.  Patient reports that family believes is behaving rationally. Patient displays problem solving skills.   Patient specifically denies suicide ideation. Patient has access/information to healthcare contacts if situation or mood changes where patient is a risk to self or others or mood becomes unstable.  Patient understands the treatment plan and is in agreement. Agrees to keep follow up and call prior or return to clinic if needed.   3. Immunization due  - Tdap vaccine greater than or equal to 7yo IM - Zoster Vaccine Adjuvanted [facility]) injection; Inject 0.5 mLs into the muscle once.  Dispense: 0.5 mL; Refill: 0  4. Vitamin D deficiency Check levels - Vitamin D (25 hydroxy)  5. Vitamin B 12 deficiency Check levels - Vitamin B12  6. Screen for STD (sexually transmitted disease)  - Hepatitis panel, acute - HIV antibody - RPR  7. Malignant melanoma, unspecified site; rash on back, persistent; suspect related to sun exposure Follow up with Dr. Tarri Glenn in [place] or aquaphor to skin  8. Dental Caries - sodium fluoride (FLUORISHIELD) 1.1 % GEL dental gel; Instill one drop of gel per tooth space of fluoride tray. Place over teeth for 5 minutes. Remove. Spit out excess. Repeat nightly.  Dispense: 125 mL; Refill: 11  9.Tobacco Use  Continue to wean off chantix and continue tobacco abstinence.   10. History of Alcohol Abuse Continue AA meetings and abstinence.   Return in about 6 months (around 09/14/2017). Caren Macadam, MD 03/16/2017

## 2017-03-16 ENCOUNTER — Encounter: Payer: Self-pay | Admitting: Family Medicine

## 2017-03-16 ENCOUNTER — Ambulatory Visit (INDEPENDENT_AMBULATORY_CARE_PROVIDER_SITE_OTHER): Payer: BLUE CROSS/BLUE SHIELD | Admitting: Family Medicine

## 2017-03-16 VITALS — BP 136/80 | HR 88 | Temp 97.6°F | Resp 16 | Ht 70.0 in | Wt 178.8 lb

## 2017-03-16 DIAGNOSIS — E559 Vitamin D deficiency, unspecified: Secondary | ICD-10-CM | POA: Diagnosis not present

## 2017-03-16 DIAGNOSIS — Z113 Encounter for screening for infections with a predominantly sexual mode of transmission: Secondary | ICD-10-CM | POA: Diagnosis not present

## 2017-03-16 DIAGNOSIS — K029 Dental caries, unspecified: Secondary | ICD-10-CM

## 2017-03-16 DIAGNOSIS — C439 Malignant melanoma of skin, unspecified: Secondary | ICD-10-CM | POA: Diagnosis not present

## 2017-03-16 DIAGNOSIS — Z23 Encounter for immunization: Secondary | ICD-10-CM | POA: Diagnosis not present

## 2017-03-16 DIAGNOSIS — E538 Deficiency of other specified B group vitamins: Secondary | ICD-10-CM | POA: Diagnosis not present

## 2017-03-16 DIAGNOSIS — C099 Malignant neoplasm of tonsil, unspecified: Secondary | ICD-10-CM

## 2017-03-16 DIAGNOSIS — F419 Anxiety disorder, unspecified: Secondary | ICD-10-CM | POA: Diagnosis not present

## 2017-03-16 DIAGNOSIS — Z0000 Encounter for general adult medical examination without abnormal findings: Secondary | ICD-10-CM | POA: Diagnosis not present

## 2017-03-16 MED ORDER — ZOSTER VAC RECOMB ADJUVANTED 50 MCG/0.5ML IM SUSR: 0.5000 mL | Freq: Once | INTRAMUSCULAR | 0 refills | Status: AC

## 2017-03-16 MED ORDER — SODIUM FLUORIDE 1.1 % DT GEL: DENTAL | 11 refills | Status: AC

## 2017-03-16 MED ORDER — ESCITALOPRAM OXALATE 20 MG PO TABS: 20.0000 mg | ORAL_TABLET | Freq: Every day | ORAL | 3 refills | Status: DC

## 2017-03-19 ENCOUNTER — Other Ambulatory Visit (HOSPITAL_COMMUNITY): Payer: Self-pay | Admitting: Emergency Medicine

## 2017-03-19 DIAGNOSIS — K432 Incisional hernia without obstruction or gangrene: Secondary | ICD-10-CM

## 2017-03-22 LAB — COMPLETE METABOLIC PANEL WITH GFR
AG Ratio: 1.7 (calc) (ref 1.0–2.5)
ALBUMIN MSPROF: 4.3 g/dL (ref 3.6–5.1)
ALT: 9 U/L (ref 9–46)
AST: 10 U/L (ref 10–35)
Alkaline phosphatase (APISO): 46 U/L (ref 40–115)
BILIRUBIN TOTAL: 0.4 mg/dL (ref 0.2–1.2)
BUN: 14 mg/dL (ref 7–25)
CHLORIDE: 99 mmol/L (ref 98–110)
CO2: 29 mmol/L (ref 20–32)
Calcium: 9.2 mg/dL (ref 8.6–10.3)
Creat: 0.85 mg/dL (ref 0.70–1.25)
GFR, EST AFRICAN AMERICAN: 107 mL/min/{1.73_m2} (ref >=60)
GFR, EST NON AFRICAN AMERICAN: 92 mL/min/{1.73_m2} (ref >=60)
Globulin: 2.5 g/dL (calc) (ref 1.9–3.7)
Glucose, Bld: 112 mg/dL — ABNORMAL HIGH (ref 65–99)
Potassium: 4.5 mmol/L (ref 3.5–5.3)
Sodium: 135 mmol/L (ref 135–146)
Total Protein: 6.8 g/dL (ref 6.1–8.1)

## 2017-03-22 LAB — HEPATITIS PANEL, ACUTE
HEP A IGM: NONREACTIVE
HEP C AB: NONREACTIVE
Hep B C IgM: NONREACTIVE
Hepatitis B Surface Ag: NONREACTIVE
SIGNAL TO CUT-OFF: 0.04 (ref <1.00)

## 2017-03-22 LAB — LIPID PANEL
CHOL/HDL RATIO: 6.7 (calc) — AB (ref <5.0)
Cholesterol: 215 mg/dL — ABNORMAL HIGH (ref <200)
HDL: 32 mg/dL — ABNORMAL LOW (ref >40)
LDL Cholesterol (Calc): 148 mg/dL (calc) — ABNORMAL HIGH
Non-HDL Cholesterol (Calc): 183 mg/dL (calc) — ABNORMAL HIGH (ref <130)
Triglycerides: 212 mg/dL — ABNORMAL HIGH (ref <150)

## 2017-03-22 LAB — VITAMIN B12: Vitamin B-12: 1199 pg/mL — ABNORMAL HIGH (ref 200–1100)

## 2017-03-22 LAB — HIV ANTIBODY (ROUTINE TESTING W REFLEX): HIV 1&2 Ab, 4th Generation: NONREACTIVE

## 2017-03-22 LAB — VITAMIN D 25 HYDROXY (VIT D DEFICIENCY, FRACTURES): VIT D 25 HYDROXY: 49 ng/mL (ref 30–100)

## 2017-03-22 LAB — RPR: RPR Ser Ql: NONREACTIVE

## 2017-03-23 ENCOUNTER — Encounter: Payer: Self-pay | Admitting: Family Medicine

## 2017-03-24 ENCOUNTER — Ambulatory Visit (HOSPITAL_COMMUNITY)
Admission: RE | Admit: 2017-03-24 | Discharge: 2017-03-24 | Disposition: A | Discharge status: Home / Self Care | Payer: BLUE CROSS/BLUE SHIELD | Source: Ambulatory Visit | Attending: Emergency Medicine | Admitting: Emergency Medicine

## 2017-03-24 DIAGNOSIS — K8020 Calculus of gallbladder without cholecystitis without obstruction: Secondary | ICD-10-CM | POA: Diagnosis not present

## 2017-03-24 DIAGNOSIS — K439 Ventral hernia without obstruction or gangrene: Secondary | ICD-10-CM | POA: Insufficient documentation

## 2017-03-24 DIAGNOSIS — N200 Calculus of kidney: Secondary | ICD-10-CM | POA: Insufficient documentation

## 2017-03-24 DIAGNOSIS — I700 Atherosclerosis of aorta: Secondary | ICD-10-CM | POA: Diagnosis not present

## 2017-03-24 DIAGNOSIS — K5790 Diverticulosis of intestine, part unspecified, without perforation or abscess without bleeding: Secondary | ICD-10-CM | POA: Diagnosis not present

## 2017-03-24 DIAGNOSIS — K4090 Unilateral inguinal hernia, without obstruction or gangrene, not specified as recurrent: Secondary | ICD-10-CM | POA: Diagnosis not present

## 2017-03-24 DIAGNOSIS — K432 Incisional hernia without obstruction or gangrene: Secondary | ICD-10-CM

## 2017-03-28 ENCOUNTER — Other Ambulatory Visit: Payer: Self-pay | Admitting: Family Medicine

## 2017-03-28 DIAGNOSIS — C099 Malignant neoplasm of tonsil, unspecified: Secondary | ICD-10-CM

## 2017-03-28 DIAGNOSIS — F17200 Nicotine dependence, unspecified, uncomplicated: Secondary | ICD-10-CM

## 2017-04-09 ENCOUNTER — Encounter: Payer: Self-pay | Admitting: Internal Medicine

## 2017-04-09 ENCOUNTER — Ambulatory Visit: Payer: BLUE CROSS/BLUE SHIELD | Admitting: Internal Medicine

## 2017-04-09 VITALS — BP 138/81 | HR 81 | Temp 98.0°F | Ht 70.0 in | Wt 180.4 lb

## 2017-04-09 DIAGNOSIS — K209 Esophagitis, unspecified without bleeding: Secondary | ICD-10-CM

## 2017-04-09 DIAGNOSIS — K219 Gastro-esophageal reflux disease without esophagitis: Secondary | ICD-10-CM

## 2017-04-09 DIAGNOSIS — K432 Incisional hernia without obstruction or gangrene: Secondary | ICD-10-CM | POA: Insufficient documentation

## 2017-04-09 NOTE — Patient Instructions (Signed)
Continue Rabeprazole 20 mg daily  GERD information  May or may not need your esophagus stretched in the future  Keep f/u appointments at [facility] regarding liver care  Office visit in 1 year and prn

## 2017-04-09 NOTE — Progress Notes (Signed)
Primary Care Physician:  Caren Macadam, MD Primary Gastroenterologist:  Dr. Gala Romney  Pre-Procedure History & Physical: HPI:  Frank Castillo is a 66 y.o. male here for follow-up of esophageal dysphagia. EGD earlier this year demonstrated both reflux radiation-induced is esophagitis with mild stricture. [place] dilation performed. Dysphagia has almost resolved. He takes rabeprazole 20 mg daily with good control of reflux symptoms. History of EtOH cirrhosis. He gets his cirrhosis care down at [facility]. Notably, I found no esophageal varices on recent EGD.  Is going over to [facility] the near future to have his ventral hernia repaired.  Status post takedown of temporary colostomy following surgery for? Spontaneous rectal perforation previously.  Past Medical History:  Diagnosis Date  . Cancer ([place])   . Cirrhosis ([place])    Followed at [facility]  . GERD (gastroesophageal reflux disease)   . History of bowel resection   . History of tonsillectomy    Dr. Benjamine Mola, 06/2015  . Hypertension   . Iron deficiency anemia 05/07/2016  . Melanoma ([place])    followed by Dr. Cleophas Dunker  . Portal hypertension ([place])   . Substance abuse ([place])    alcohol  . 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 Tonsillar mass    left    Past Surgical History:  Procedure Laterality Date  . BIOPSY  07/02/2016   Performed by Daneil Dolin, MD at [place]  . COLON SURGERY    . COLONOSCOPY WITH PROPOFOL N/A 07/02/2016   Performed by Daneil Dolin, MD at [place]  . ESOPHAGOGASTRODUODENOSCOPY (EGD) WITH PROPOFOL N/A 07/02/2016   Performed by Daneil Dolin, MD at [place]  . EXPLORATORY LAPAROTOMY, SIGMOID COLECTOMY, COLOSTOMY N/A 03/28/2016   Performed by Leighton Ruff, MD at [facility] ORS  . HERNIA REPAIR Right   . HERNIA REPAIR INCISIONAL  09/02/2016   Performed by Leighton Ruff, MD at [facility] ORS  . LAPAROSCOPIC COLOSTOMY REVERSAL N/A 09/02/2016   Performed by Leighton Ruff, MD at [facility] ORS  . LEFT TONSILLECTOMY  Left 07/09/2015   Performed by Leta Baptist, MD at [facility]  . MALONEY DILATION N/A 07/02/2016   Performed by Daneil Dolin, MD at [place]  . MOUTH SURGERY    . REMOVAL OF GASTROSTOMY TUBE N/A 09/02/2016   Performed by Leighton Ruff, MD at [facility] ORS  . REMOVAL PORT-A-CATH  09/02/2016   Performed by Leighton Ruff, MD at [facility] ORS  . SCAR REVISION N/A 09/02/2016   Performed by Leighton Ruff, MD at [facility] ORS  . TONSILLECTOMY      Prior to Admission medications   Medication Sig Start Date End Date Taking? Authorizing Provider  cetirizine (ZYRTEC) 10 MG tablet Take 10 mg by mouth at bedtime.    Yes [provider]  cholecalciferol (VITAMIN D) 1000 units tablet Take 1,000 Units by mouth daily.   Yes [provider]  clotrimazole-betamethasone (LOTRISONE) cream Apply 1 application topically 2 (two) times daily as needed. For rash 07/27/16  Yes Holley Bouche, NP  diphenhydrAMINE (BENADRYL) 50 MG tablet Take 50 mg by mouth at bedtime as needed for sleep.   Yes [provider]  escitalopram (LEXAPRO) 20 MG tablet Take 1 tablet (20 mg total) by mouth daily. 03/16/17  Yes Hagler, [name], MD  Melatonin 10 MG TABS Take 1 tablet by mouth at bedtime.   Yes [provider]  RABEprazole (ACIPHEX) 20 MG tablet Take 1 tablet (20 mg  total) by mouth daily. 08/11/16  Yes Tishawna Larouche, Cristopher Estimable, MD  sodium fluoride (FLUORISHIELD) 1.1 % GEL dental gel Instill one drop of gel per tooth space of fluoride tray. Place over teeth for 5 minutes. Remove. Spit out excess. Repeat nightly. 03/16/17  Yes Hagler, [name], MD  triamcinolone cream (KENALOG) 0.1 % Apply 1 application 2 (two) times daily topically. 03/25/17  Yes [provider]  vitamin B-12 (CYANOCOBALAMIN) 1000 MCG tablet Take 1,000 mcg by mouth daily.   Yes [provider]  CHANTIX 1 MG tablet TAKE ONE TABLET BY MOUTH TWICE DAILY Patient not taking: Reported on 04/09/2017 03/29/17   Caren Macadam, MD     Allergies as of 04/09/2017 - Review Complete 04/09/2017  Allergen Reaction Noted  . Bee venom Swelling 07/31/2015  . Other  11/28/2015    Family History  Problem Relation Age of Onset  . Macular degeneration Mother   . Dementia Mother   . Alcoholism Father   . Diabetes Maternal Aunt   . Diabetes Maternal Uncle   . Mental illness Sister     Social History   Socioeconomic History  . Marital status: Single    Spouse name: Not on file  . Number of children: 0  . Years of education: Not on file  . Highest education level: Not on file  Social Needs  . Financial resource strain: Not on file  . Food insecurity - worry: Not on file  . Food insecurity - inability: Not on file  . Transportation needs - medical: Not on file  . Transportation needs - non-medical: Not on file  Occupational History  . Not on file  Tobacco Use  . Smoking status: Former Smoker    Packs/day: 0.50    Years: 30.00    Pack years: 15.00    Types: Cigarettes    Last attempt to quit: 11/22/2016    Years since quitting: 0.3  . Smokeless tobacco: Never Used  Substance and Sexual Activity  . Alcohol use: No    Alcohol/week: 0.0 oz    Comment: former abuse- quit 09/2011  . Drug use: No  . Sexual activity: Not on file  Other Topics Concern  . Not on file  Social History Narrative   Divorced.   No children.   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    Has been smoking off/on since age 44, quit smoking 11/2016.    Exercises minimally   Eats all food groups.   Wears seatbelt.   Mother lives with him, has dementia.   Attends AA meetings.     Review of Systems: See HPI, otherwise negative ROS  Physical Exam: BP 138/81   Pulse 81   Temp 98 F (36.7 C) (Oral)   Ht 5\' 10"  (1.778 m)   Wt 180 lb 6.4 oz (81.8 kg)   BMI 25.88 kg/m  General:   Alert,  Well-developed, well-nourished, pleasant and cooperative  in NAD Skin:  Intact without significant lesions or rashes. Eyes:  Sclera clear, no icterus.   Conjunctiva pink. Ears:  Normal auditory acuity. Nose:  No deformity, discharge,  or lesions. Mouth:  No deformity or lesions. Neck:  Supple; no masses or thyromegaly. No significant cervical adenopathy. Lungs:  Clear throughout to auscultation.   No wheezes, crackles, or rhonchi. No acute distress. Heart:  Regular rate and rhythm; no murmurs, clicks, rubs,  or gallops. Abdomen:  multiple surgical scars present. Has a large rectus defect midline.  Soft and nontender. Pulses:  Normal pulses noted. Extremities:  Without clubbing or edema.  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  Reflux symptoms well controlled on rabeprazole daily.   Recommendations:   Continue Rabeprazole 20 mg daily  GERD information  May or may not need  esophagus dilated in the future  Keep f/u appointments at [facility] regarding liver care  Office visit in 1 year and prn                      Notice: This dictation was prepared with Dragon dictation along with smaller phrase technology. Any transcriptional errors that result from this process are unintentional and may not be corrected upon review.

## 2017-05-06 MED FILL — FLUORISHIELD 1.1% GEL: 1.1 % | 90 days supply | Qty: 342 | Fill #0

## 2017-05-20 ENCOUNTER — Ambulatory Visit: Payer: BLUE CROSS/BLUE SHIELD | Admitting: Family Medicine

## 2017-05-20 ENCOUNTER — Encounter: Payer: Self-pay | Admitting: Family Medicine

## 2017-05-20 ENCOUNTER — Other Ambulatory Visit: Payer: Self-pay

## 2017-05-20 VITALS — BP 160/88 | HR 76 | Temp 98.7°F | Resp 16 | Ht 70.0 in | Wt 181.0 lb

## 2017-05-20 DIAGNOSIS — R739 Hyperglycemia, unspecified: Secondary | ICD-10-CM | POA: Diagnosis not present

## 2017-05-20 DIAGNOSIS — I10 Essential (primary) hypertension: Secondary | ICD-10-CM | POA: Diagnosis not present

## 2017-05-20 DIAGNOSIS — Z87891 Personal history of nicotine dependence: Secondary | ICD-10-CM | POA: Diagnosis not present

## 2017-05-20 DIAGNOSIS — K1370 Unspecified lesions of oral mucosa: Secondary | ICD-10-CM

## 2017-05-20 DIAGNOSIS — K219 Gastro-esophageal reflux disease without esophagitis: Secondary | ICD-10-CM

## 2017-05-20 DIAGNOSIS — E782 Mixed hyperlipidemia: Secondary | ICD-10-CM

## 2017-05-20 DIAGNOSIS — K222 Esophageal obstruction: Secondary | ICD-10-CM

## 2017-05-20 MED ORDER — LISINOPRIL 10 MG PO TABS: 10.0000 mg | ORAL_TABLET | Freq: Every day | ORAL | 3 refills | Status: DC

## 2017-05-20 NOTE — Progress Notes (Signed)
Patient ID: Frank Castillo, male    DOB: Apr 28, 1951, 66 y.o.   MRN: [id number]  Chief Complaint  Patient presents with  . Hyperlipidemia  . Diabetes    Allergies Bee venom and Other  Subjective:   Frank Castillo is a 66 y.o. male who presents to [facility] today.  HPI Here to discuss cholesterol and blood sugars. Has really not tried to work on diet at all to lower cholesterol. Reports that after had radiation therapy needed to gain weight and so really had eaten whatever wants. Reports that has some trouble with eating certain foods due to radiation damage to esophagus. Has been followed by Dr. 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   Has had a sore place in mouth for about two weeks. Left lower inside of mouth has been sore. Does not have an open wound. No open sore. No bleeding of gums.    Hyperlipidemia  This is a new problem. The current episode started more than 1 month ago. The problem is uncontrolled. Recent lipid tests were reviewed and are high. Exacerbating diseases include liver disease. He has no history of chronic renal disease, diabetes, obesity or nephrotic syndrome. Factors aggravating his hyperlipidemia include fatty foods. Pertinent negatives include no chest pain, focal weakness or myalgias. He is currently on no antihyperlipidemic treatment. Compliance problems include adherence to diet and adherence to exercise.   Oral Pain   This is a new problem. The current episode started 1 to 4 weeks ago. The problem occurs daily. The problem has been unchanged. The pain is at a severity of 3/10. The pain is mild. Pertinent negatives include no difficulty swallowing, facial pain, fever, oral bleeding, sinus pressure or thermal sensitivity. He has tried nothing for the symptoms. The treatment provided no relief.    Past Medical History:  Diagnosis Date  . Cancer ([place])   . Cirrhosis ([place])    Followed at [facility]  . GERD  (gastroesophageal reflux disease)   . History of bowel resection   . History of tonsillectomy    Dr. Benjamine Mola, 06/2015  . Hypertension   . Iron deficiency anemia 05/07/2016  . Melanoma ([place])    followed by Dr. Cleophas Dunker  . Portal hypertension ([place])   . Substance abuse ([place])    alcohol  . 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 Tonsillar mass    left    Past Surgical History:  Procedure Laterality Date  . BIOPSY  07/02/2016   Procedure: BIOPSY;  Surgeon: Daneil Dolin, MD;  [facility];  Service: Endoscopy;;  esophageal  . COLON SURGERY    . COLONOSCOPY WITH PROPOFOL N/A 07/02/2016   Procedure: COLONOSCOPY WITH PROPOFOL;  Surgeon: Daneil Dolin, MD;  [facility];  Service: Endoscopy;  Laterality: N/A;  9:45am  . COLOSTOMY TAKEDOWN N/A 09/02/2016   Procedure: LAPAROSCOPIC COLOSTOMY REVERSAL;  Surgeon: Leighton Ruff, MD;  Location: [facility];  Service: General;  Laterality: N/A;  . ESOPHAGOGASTRODUODENOSCOPY (EGD) WITH PROPOFOL N/A 07/02/2016   Procedure: ESOPHAGOGASTRODUODENOSCOPY (EGD) WITH PROPOFOL;  Surgeon: Daneil Dolin, MD;  [facility];  Service: Endoscopy;  Laterality: N/A;  . HERNIA REPAIR Right   . INCISIONAL HERNIA REPAIR  09/02/2016   Procedure: HERNIA REPAIR INCISIONAL;  Surgeon: Leighton Ruff, MD;  [facility]: [facility];  Service: General;;  . LAPAROTOMY N/A 03/28/2016   Procedure: EXPLORATORY LAPAROTOMY, SIGMOID COLECTOMY, COLOSTOMY;  Surgeon: Leighton Ruff, MD;  [facility]: [facility];  Service: General;  Laterality: N/A;  . [name] DILATION N/A 07/02/2016   Procedure: Keturah Shavers;  Surgeon: Daneil Dolin, MD;  [facility];  Service: Endoscopy;  Laterality: N/A;  . MOUTH SURGERY    . PORT-A-CATH REMOVAL  09/02/2016   Procedure: REMOVAL PORT-A-CATH;  Surgeon: Leighton Ruff, MD;  [facility]: [facility];  Service: General;;  . REMOVAL OF GASTROSTOMY TUBE N/A 09/02/2016   Procedure: REMOVAL OF GASTROSTOMY TUBE;  Surgeon: Leighton Ruff, MD;   [facility];  Service: General;  Laterality: N/A;  . TONSILLECTOMY Left 07/09/2015   Procedure: LEFT TONSILLECTOMY;  Surgeon: Leta Baptist, MD;  Location: [facility];  Service: ENT;  Laterality: Left;  . TONSILLECTOMY    . Z-PLASTY SCAR REVISION N/A 09/02/2016   Procedure: SCAR REVISION;  Surgeon: Leighton Ruff, MD;  [facility];  Service: General;  Laterality: N/A;    Family History  Problem Relation Age of Onset  . Macular degeneration Mother   . Dementia Mother   . Alcoholism Father   . Diabetes Maternal Aunt   . Diabetes Maternal Uncle   . Mental illness Sister      Social History   Socioeconomic History  . Marital status: Single    Spouse name: None  . Number of children: 0  . Years of education: None  . Highest education level: None  Social Needs  . Financial resource strain: None  . Food insecurity - worry: None  . Food insecurity - inability: None  . Transportation needs - medical: None  . Transportation needs - non-medical: None  Occupational History  . None  Tobacco Use  . Smoking status: Former Smoker    Packs/day: 0.50    Years: 30.00    Pack years: 15.00    Types: Cigarettes    Last attempt to quit: 11/22/2016    Years since quitting: 0.4  . Smokeless tobacco: Never Used  Substance and Sexual Activity  . Alcohol use: No    Alcohol/week: 0.0 oz    Comment: former abuse- quit 09/2011  . Drug use: No  . Sexual activity: None  Other Topics Concern  . None  Social History Narrative   Divorced.   No children.   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    Has been smoking off/on since age 56, quit smoking 11/2016.    Exercises minimally   Eats all food groups.   Wears seatbelt.   Mother lives with him, has dementia.   Attends AA meetings.     Review of Systems  Constitutional: Negative for fatigue and fever.  HENT: Positive for dental problem  and trouble swallowing. Negative for drooling, nosebleeds, sinus pressure, tinnitus and voice change.   Cardiovascular: Negative for chest pain.  Gastrointestinal: Negative for abdominal pain, nausea and vomiting.  Musculoskeletal: Negative for myalgias.  Skin: Negative for rash.  Neurological: Negative for focal weakness.  Psychiatric/Behavioral: Negative for behavioral problems and decreased concentration. The patient is not nervous/anxious.      Objective:   BP (!) 160/88   Pulse 76   Temp 98.7 F (37.1 C) (Temporal)   Resp 16   Ht 5\' 10"  (1.778 m)   Wt 181 lb (82.1 kg)   SpO2 97%   BMI 25.97 kg/m   Physical Exam  Constitutional: He is oriented to person, place, and time. He appears well-developed and well-nourished.  HENT:  Head: Normocephalic and atraumatic.  Mouth/Throat: Uvula is midline. He does not have dentures. Dental caries present. No dental abscesses.    Tenderness at lower left avelolar ridge. Small, 2 mm, white superficial area on gum surface.   Eyes: EOM are normal. Pupils are equal, round, and reactive to light.  Neck: Normal range of motion. Neck supple.  Cardiovascular: Normal rate, regular rhythm and normal heart sounds.  Pulmonary/Chest: Effort normal and breath sounds normal. No respiratory distress.  Abdominal: Soft. Bowel sounds are normal.  Wearing abdominal binder. Abdominal wound healing from surgery.   Neurological: He is alert and oriented to person, place, and time.  Skin: Skin is warm and dry. No rash noted. No erythema.  Psychiatric: He has a normal mood and affect. His behavior is normal. Judgment and thought content normal.  Vitals reviewed.    Assessment and Plan  1. HTN, goal below 140/90 Lifestyle modifications discussed with patient including a diet emphasizing vegetables, fruits, and whole grains. Limiting intake of sodium to less than 2,400 mg per day.  Recommendations discussed include consuming low-fat dairy products, poultry,  fish, legumes, non-tropical vegetable oils, and nuts; and limiting intake of sweets, sugar-sweetened beverages, and red meat. Discussed following a plan such as the Dietary Approaches to Stop Hypertension (DASH) diet. Patient to read up on this diet.   - lisinopril (PRINIVIL,ZESTRIL) 10 MG tablet; Take 1 tablet (10 mg total) by mouth daily.  Dispense: 90 tablet; Refill: 3  2. Mixed hyperlipidemia At this time will defer statin secondary to history of cirrhosis. Patient will  try to lower LDL with dietary changes. Due to radiation problems with esophagus, will refer MNT to help with diet and offer counseling.  - Amb ref to Medical Nutrition Therapy-MNT - Lipid panel Hyperlipidemia and the associated risk of ASCVD were discussed today. Primary vs. Secondary prevention of ASCVD were discussed and how it relates to patient morbidity, mortality, and quality of life. Shared decision making with patient including the risks of statins vs.benefits of ASCVD risk reduction discussed.  Risks of stains discussed including myopathy, rhabdomyoloysis, liver problems, increased risk of diabetes discussed. We discussed heart healthy diet, lifestyle modifications, risk factor modifications, and adherence to the recommended treatment plan.   3. Elevated blood sugar Recent blood sugar elevated, but last A1c was WNL. Recommend diet and weight loss.  - Amb ref to Medical Nutrition Therapy-MNT  4. GERD with stricture Continue protonix and follow up with Dr. Sydell Axon.  - Amb ref to Medical Nutrition Therapy-MNT  5. Radiation-induced esophageal stricture Follow up with GI as directed.  - Amb ref to Medical Nutrition Therapy-MNT  6. History of tobacco abuse   7. Lesion of mouth Patient will follow up with dentist and if any concern, will refer back to Dr. Benjamine Mola for for evaluation. Patient agreeable with plan.   OV was 25 minutes and greater than 50% of OV spent counseling regarding topics above.  Return in about 4  weeks (around 06/17/2017) for BP. Caren Macadam, MD 05/20/2017

## 2017-05-20 NOTE — Patient Instructions (Signed)
AHA [email or web address]/ American Heart Association [email or web address]       Hypertension Hypertension is another name for high blood pressure. High blood pressure forces your heart to work harder to pump blood. This can cause problems over time. There are two numbers in a blood pressure reading. There is a top number (systolic) over a bottom number (diastolic). It is best to have a blood pressure below 120/80. Healthy choices can help lower your blood pressure. You may need medicine to help lower your blood pressure if:  Your blood pressure cannot be lowered with healthy choices.  Your blood pressure is higher than 130/80.  Follow these instructions at home: Eating and drinking  If directed, follow the DASH eating plan. This diet includes: ? Filling half of your plate at each meal with fruits and vegetables. ? Filling one quarter of your plate at each meal with whole grains. Whole grains include whole wheat pasta, brown rice, and whole grain bread. ? Eating or drinking low-fat dairy products, such as skim milk or low-fat yogurt. ? Filling one quarter of your plate at each meal with low-fat (lean) proteins. Low-fat proteins include fish, skinless chicken, eggs, beans, and tofu. ? Avoiding fatty meat, cured and processed meat, or chicken with skin. ? Avoiding premade or processed food.  Eat less than 1,500 mg of salt (sodium) a day.  Limit alcohol use to no more than 1 drink a day for nonpregnant women and 2 drinks a day for men. One drink equals 12 oz of beer, 5 oz of wine, or 1 oz of hard liquor. Lifestyle  Work with your doctor to stay at a healthy weight or to lose weight. Ask your doctor what the best weight is for you.  Get at least 30 minutes of exercise that causes your heart to beat faster (aerobic exercise) most days of the week. This may include walking, swimming, or biking.  Get at least 30 minutes of exercise that strengthens your muscles (resistance exercise) at least 3 days a  week. This may include lifting weights or pilates.  Do not use any products that contain nicotine or tobacco. This includes cigarettes and e-cigarettes. If you need help quitting, ask your doctor.  Check your blood pressure at home as told by your doctor.  Keep all follow-up visits as told by your doctor. This is important. Medicines  Take over-the-counter and prescription medicines only as told by your doctor. Follow directions carefully.  Do not skip doses of blood pressure medicine. The medicine does not work as well if you skip doses. Skipping doses also puts you at risk for problems.  Ask your doctor about side effects or reactions to medicines that you should watch for. Contact a doctor if:  You think you are having a reaction to the medicine you are taking.  You have headaches that keep coming back (recurring).  You feel dizzy.  You have swelling in your ankles.  You have trouble with your vision. Get help right away if:  You get a very bad headache.  You start to feel confused.  You feel weak or numb.  You feel faint.  You get very bad pain in your: ? Chest. ? Belly (abdomen).  You throw up (vomit) more than once.  You have trouble breathing. Summary  Hypertension is another name for high blood pressure.  Making healthy choices can help lower blood pressure. If your blood pressure cannot be controlled with healthy choices, you may need to take medicine. This information is  not intended to replace advice given to you by your health care provider. Make sure you discuss any questions you have with your health care provider. Document Released: 10/28/2007 Document Revised: 04/08/2016 Document Reviewed: 04/08/2016 Elsevier Interactive Patient Education  2018 [employer]. Cholesterol Cholesterol is a fat. Your body needs a small amount of cholesterol. Cholesterol (plaque) may build up in your blood vessels (arteries). That makes you more likely to have a heart  attack or stroke. You cannot feel your cholesterol level. Having a blood test is the only way to find out if your level is high. Keep your test results. Work with your doctor to keep your cholesterol at a good level. What do the results mean?  Total cholesterol is how much cholesterol is in your blood.  LDL is bad cholesterol. This is the type that can build up. Try to have low LDL.  HDL is good cholesterol. It cleans your blood vessels and carries LDL away. Try to have high HDL.  Triglycerides are fat that the body can store or burn for energy. What are good levels of cholesterol?  Total cholesterol below 200.  LDL below 100 is good for people who have health risks. LDL below 70 is good for people who have very high risks.  HDL above 40 is good. It is best to have HDL of 60 or higher.  Triglycerides below 150. How can I lower my cholesterol? Diet Follow your diet program as told by your doctor.  Choose fish, white meat chicken, or [place] that is roasted or baked. Try not to eat red meat, fried foods, sausage, or lunch meats.  Eat lots of fresh fruits and vegetables.  Choose whole grains, beans, pasta, potatoes, and cereals.  Choose olive oil, corn oil, or canola oil. Only use small amounts.  Try not to eat butter, mayonnaise, shortening, or palm kernel oils.  Try not to eat foods with trans fats.  Choose low-fat or nonfat dairy foods. ? Drink skim or nonfat milk. ? Eat low-fat or nonfat yogurt and cheeses. ? Try not to drink whole milk or cream. ? Try not to eat ice cream, egg yolks, or full-fat cheeses.  Healthy desserts include angel food cake, ginger snaps, animal crackers, hard candy, popsicles, and low-fat or nonfat frozen yogurt. Try not to eat pastries, cakes, pies, and cookies.  Exercise Follow your exercise program as told by your doctor.  Be more active. Try gardening, walking, and taking the stairs.  Ask your doctor about ways that you can be more  active.  Medicine  Take over-the-counter and prescription medicines only as told by your doctor. This information is not intended to replace advice given to you by your health care provider. Make sure you discuss any questions you have with your health care provider. Document Released: 08/07/2008 Document Revised: 12/11/2015 Document Reviewed: 11/21/2015 Elsevier Interactive Patient Education  [employer].

## 2017-05-25 DIAGNOSIS — C411 Malignant neoplasm of mandible: Secondary | ICD-10-CM

## 2017-05-25 HISTORY — PX: MOUTH SURGERY: SHX715

## 2017-05-25 HISTORY — DX: Malignant neoplasm of mandible: C41.1

## 2017-05-25 HISTORY — PX: HERNIA REPAIR: SHX51

## 2017-05-28 ENCOUNTER — Other Ambulatory Visit (HOSPITAL_COMMUNITY): Payer: Self-pay | Admitting: *Deleted

## 2017-05-28 DIAGNOSIS — C090 Malignant neoplasm of tonsillar fossa: Secondary | ICD-10-CM

## 2017-05-31 ENCOUNTER — Inpatient Hospital Stay (HOSPITAL_COMMUNITY): Payer: BLUE CROSS/BLUE SHIELD | Attending: Hematology and Oncology | Admitting: Oncology

## 2017-05-31 ENCOUNTER — Telehealth: Payer: Self-pay | Admitting: Family Medicine

## 2017-05-31 ENCOUNTER — Inpatient Hospital Stay (HOSPITAL_COMMUNITY): Payer: BLUE CROSS/BLUE SHIELD

## 2017-05-31 ENCOUNTER — Encounter (HOSPITAL_COMMUNITY): Payer: Self-pay | Admitting: Oncology

## 2017-05-31 VITALS — BP 137/84 | HR 66 | Temp 98.0°F | Resp 16 | Ht 70.0 in | Wt 176.0 lb

## 2017-05-31 DIAGNOSIS — A630 Anogenital (venereal) warts: Secondary | ICD-10-CM | POA: Diagnosis not present

## 2017-05-31 DIAGNOSIS — I10 Essential (primary) hypertension: Secondary | ICD-10-CM | POA: Diagnosis not present

## 2017-05-31 DIAGNOSIS — K631 Perforation of intestine (nontraumatic): Secondary | ICD-10-CM | POA: Diagnosis not present

## 2017-05-31 DIAGNOSIS — K766 Portal hypertension: Secondary | ICD-10-CM | POA: Insufficient documentation

## 2017-05-31 DIAGNOSIS — Z79899 Other long term (current) drug therapy: Secondary | ICD-10-CM

## 2017-05-31 DIAGNOSIS — K219 Gastro-esophageal reflux disease without esophagitis: Secondary | ICD-10-CM | POA: Diagnosis not present

## 2017-05-31 DIAGNOSIS — C099 Malignant neoplasm of tonsil, unspecified: Secondary | ICD-10-CM

## 2017-05-31 DIAGNOSIS — Z85820 Personal history of malignant melanoma of skin: Secondary | ICD-10-CM | POA: Diagnosis not present

## 2017-05-31 DIAGNOSIS — Z9221 Personal history of antineoplastic chemotherapy: Secondary | ICD-10-CM | POA: Diagnosis not present

## 2017-05-31 DIAGNOSIS — Z923 Personal history of irradiation: Secondary | ICD-10-CM

## 2017-05-31 DIAGNOSIS — K7460 Unspecified cirrhosis of liver: Secondary | ICD-10-CM | POA: Insufficient documentation

## 2017-05-31 DIAGNOSIS — R1310 Dysphagia, unspecified: Secondary | ICD-10-CM | POA: Diagnosis not present

## 2017-05-31 DIAGNOSIS — K1230 Oral mucositis (ulcerative), unspecified: Secondary | ICD-10-CM

## 2017-05-31 DIAGNOSIS — C090 Malignant neoplasm of tonsillar fossa: Secondary | ICD-10-CM

## 2017-05-31 LAB — COMPREHENSIVE METABOLIC PANEL
ALT: 15 U/L — ABNORMAL LOW (ref 17–63)
AST: 13 U/L — ABNORMAL LOW (ref 15–41)
Albumin: 4.4 g/dL (ref 3.5–5.0)
Alkaline Phosphatase: 44 U/L (ref 38–126)
Anion gap: 9 (ref 5–15)
BUN: 13 mg/dL (ref 6–20)
CHLORIDE: 97 mmol/L — AB (ref 101–111)
CO2: 26 mmol/L (ref 22–32)
Calcium: 9.4 mg/dL (ref 8.9–10.3)
Creatinine, Ser: 0.85 mg/dL (ref 0.61–1.24)
GFR calc Af Amer: >60 mL/min (ref >60)
Glucose, Bld: 100 mg/dL — ABNORMAL HIGH (ref 65–99)
POTASSIUM: 4.4 mmol/L (ref 3.5–5.1)
Sodium: 132 mmol/L — ABNORMAL LOW (ref 135–145)
Total Bilirubin: 0.2 mg/dL — ABNORMAL LOW (ref 0.3–1.2)
Total Protein: 7 g/dL (ref 6.5–8.1)

## 2017-05-31 LAB — CBC WITH DIFFERENTIAL/PLATELET
BASOS ABS: 0.1 10*3/uL (ref 0.0–0.1)
Basophils Relative: 2 %
EOS ABS: 0.2 10*3/uL (ref 0.0–0.7)
EOS PCT: 3 %
HCT: 39.5 % (ref 39.0–52.0)
Hemoglobin: 12.5 g/dL — ABNORMAL LOW (ref 13.0–17.0)
LYMPHS ABS: 0.8 10*3/uL (ref 0.7–4.0)
LYMPHS PCT: 11 %
MCH: 27.6 pg (ref 26.0–34.0)
MCHC: 31.6 g/dL (ref 30.0–36.0)
MCV: 87.2 fL (ref 78.0–100.0)
MONO ABS: 0.8 10*3/uL (ref 0.1–1.0)
Monocytes Relative: 12 %
Neutro Abs: 4.8 10*3/uL (ref 1.7–7.7)
Neutrophils Relative %: 72 %
PLATELETS: 275 10*3/uL (ref 150–400)
RBC: 4.53 MIL/uL (ref 4.22–5.81)
RDW: 14.7 % (ref 11.5–15.5)
WBC: 6.6 10*3/uL (ref 4.0–10.5)

## 2017-05-31 NOTE — Telephone Encounter (Signed)
FYI

## 2017-05-31 NOTE — Progress Notes (Signed)
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 Isidore Moos.      08/21/2015 - 09/10/2015 Chemotherapy    Cisplatin every 21 days with XRT.      08/28/2015 Adverse Reaction    Tinnitis, Cisplatin-induced.      09/11/2015 - 09/30/2015 Chemotherapy    Carboplatin/5FU      09/20/2015 - 09/21/2015 Hospital Admission    Intractable nausea/vomiting. Severe Mucositis      10/17/2015 - 10/20/2015 Hospital Admission    Esophageal/oral candidasis. Shingles. Fever. Chemotherapy induced pancytopenia      01/24/2016 PET scan    Resolution of the left tonsillar mass and left neck adenopathy. No findings for residual or locally recurrent tumor, adenopathy or distant metastatic disease.      03/28/2016 - 04/08/2016 Hospital Admission    Admitted with severe abdominal pain. Found to diffuse peritonitis from perforated sigmoid colon requiring colostomy. Discharged to [facility] in [place].             INTERVAL HISTORY:   Patient presents to clinic today for follow-up. He has past medical history of Stage IVA left tonsil cancer s/p recent colostomy d/t diffuse peritonitis from perforated sigmoid colon in 03/2016. He is also actively working on smoking cessation, currently on Chantix therapy.    He states that he is well feeling . he tells me his appetite is 75% and energy level to 75%. He has no pain. He continues to have mild dysphagia with solids but this is getting better. He states he appears to be making more saliva. He has seen a nutritionist to help  with food choices. He denies diarrhea. His bowel movements continue to improve status post hernia and colostomy reversal. He is due to see Dr. Benjamine Mola this month for follow-up. He has lost 5 pounds since last visit. He continues to complain of tinnitus from chemotherapy.   ADDITIONAL REVIEW OF SYSTEMS:  Review of Systems  Constitutional: Positive for malaise/fatigue (Energy levels are improving. ). Negative for chills, fever and weight loss.  HENT: Positive for tinnitus. Negative for sore throat.        Dysphagia Tinnitus continued from chemotherapy  Eyes: Negative.   Respiratory: Negative.  Negative for cough.   Cardiovascular: Negative.  Negative for chest pain and palpitations.  Gastrointestinal: Negative for abdominal pain, blood in stool, constipation, diarrhea, melena, nausea and vomiting.  Genitourinary: Negative.  Negative for dysuria and hematuria.  Musculoskeletal: Negative.   Skin: Negative for itching and rash.  Neurological: Negative.   Endo/Heme/Allergies: Negative.   Psychiatric/Behavioral: Negative.  Negative for depression. The patient is not nervous/anxious.    Additional ROS per HPI.    PAST MEDICAL & SURGICAL HISTORY:  Past Medical History:  Diagnosis Date  . Cancer ([place])   . Cirrhosis ([place])    [facility] at [facility]  . GERD (gastroesophageal reflux disease)   . History of bowel resection   . History of tonsillectomy    Dr.  Teoh, 06/2015  . Hypertension   . Iron deficiency anemia 05/07/2016  . Melanoma ([place])    followed by Dr. Cleophas Dunker  . Portal hypertension ([place])   . Substance abuse ([place])    alcohol  . Tonsillar cancer ([place]) 06/2015   SCC  . Tonsillar mass    left   Past Surgical History:  Procedure Laterality Date  . BIOPSY  07/02/2016   Procedure: BIOPSY;  Surgeon: Daneil Dolin, MD;  [facility];  Service: Endoscopy;;  esophageal  . COLON SURGERY    . COLONOSCOPY WITH PROPOFOL N/A 07/02/2016   Procedure: COLONOSCOPY WITH PROPOFOL;  Surgeon:  Daneil Dolin, MD;  [facility];  Service: Endoscopy;  Laterality: N/A;  9:45am  . COLOSTOMY TAKEDOWN N/A 09/02/2016   Procedure: LAPAROSCOPIC COLOSTOMY REVERSAL;  Surgeon: Leighton Ruff, MD;  Location: [facility];  Service: General;  Laterality: N/A;  . ESOPHAGOGASTRODUODENOSCOPY (EGD) WITH PROPOFOL N/A 07/02/2016   Procedure: ESOPHAGOGASTRODUODENOSCOPY (EGD) WITH PROPOFOL;  Surgeon: Daneil Dolin, MD;  [facility];  Service: Endoscopy;  Laterality: N/A;  . HERNIA REPAIR Right   . INCISIONAL HERNIA REPAIR  09/02/2016   Procedure: HERNIA REPAIR INCISIONAL;  Surgeon: Leighton Ruff, MD;  [facility];  Service: General;;  . LAPAROTOMY N/A 03/28/2016   Procedure: EXPLORATORY LAPAROTOMY, SIGMOID COLECTOMY, COLOSTOMY;  Surgeon: Leighton Ruff, MD;  [facility];  Service: General;  Laterality: N/A;  [name] DILATION N/A 07/02/2016   Procedure: Keturah Shavers;  Surgeon: Daneil Dolin, MD;  [facility];  Service: Endoscopy;  Laterality: N/A;  . MOUTH SURGERY    . PORT-A-CATH REMOVAL  09/02/2016   Procedure: REMOVAL PORT-A-CATH;  Surgeon: Leighton Ruff, MD;  [facility];  Service: General;;  . REMOVAL OF GASTROSTOMY TUBE N/A 09/02/2016   Procedure: REMOVAL OF GASTROSTOMY TUBE;  Surgeon: Leighton Ruff, MD;  [facility];  Service: General;  Laterality: N/A;  . TONSILLECTOMY Left 07/09/2015   Procedure: LEFT TONSILLECTOMY;  Surgeon: Leta Baptist, MD;  Location: [facility];  Service: ENT;  Laterality: Left;  . TONSILLECTOMY    . Z-PLASTY SCAR REVISION N/A 09/02/2016   Procedure: SCAR REVISION;  Surgeon: Leighton Ruff, MD;  [facility];  Service: General;  Laterality: N/A;      CURRENT MEDICATIONS:  Current Outpatient Medications on File Prior to Visit  Medication Sig Dispense Refill  . cetirizine (ZYRTEC) 10 MG tablet Take 10 mg by mouth at bedtime.     Marland Kitchen DEXILANT 60 MG capsule Take 1 capsule by mouth daily.  11  . diphenhydrAMINE  (BENADRYL) 50 MG tablet Take 50 mg by mouth at bedtime as needed for sleep.    Marland Kitchen escitalopram (LEXAPRO) 20 MG tablet Take 1 tablet (20 mg total) by mouth daily. 90 tablet 3  . lisinopril (PRINIVIL,ZESTRIL) 10 MG tablet Take 1 tablet (10 mg total) by mouth daily. 90 tablet 3  . Melatonin 10 MG TABS Take 1 tablet by mouth at bedtime.    . RABEprazole (ACIPHEX) 20 MG tablet Take 1 tablet (20 mg total) by mouth daily. 30 tablet 11  . sodium fluoride (FLUORISHIELD) 1.1 % GEL dental gel Instill one drop of gel per tooth space of fluoride tray. Place over teeth for 5 minutes. Remove. Spit out excess. Repeat nightly. 125 mL 11  . triamcinolone cream (KENALOG) 0.1 % Apply 1 application 2 (two) times daily topically.  2  . vitamin B-12 (CYANOCOBALAMIN) 1000 MCG tablet Take 1,000  mcg by mouth daily.    . clotrimazole-betamethasone (LOTRISONE) cream Apply 1 application topically 2 (two) times daily as needed. For rash (Patient not taking: Reported on 05/20/2017) 30 g 0   Current Facility-Administered Medications on File Prior to Visit  Medication Dose Route Frequency Provider Last Rate Last Dose  . 0.9 %  sodium chloride infusion   Intravenous Continuous Kefalas, Manon Hilding, PA-C      . 0.9 %  sodium chloride infusion   Intravenous Continuous Kefalas, Thomas S, PA-C      . 0.9 %  sodium chloride infusion   Intravenous Continuous Penland, Larene [place] K, MD      . 0.9 %  sodium chloride infusion   Intravenous Continuous Penland, Larene [place] K, MD      . 0.9 %  sodium chloride infusion   Intravenous Continuous Penland, Kelby Fam, MD        ALLERGIES: Allergies  Allergen Reactions  . Bee Venom Swelling  . Other     Opthalmic mycin irritates eyes.     PHYSICAL EXAM:   Vitals:  Vitals:   05/31/17 1502  BP: 137/84  Pulse: 66  Resp: 16  Temp: 98 F (36.7 C)  SpO2: 100%    Filed Weights   05/31/17 1502  Weight: 176 lb (79.8 kg)   General: Well nourished male in no acute distress. Unaccompanied today.    HEENT: Head is normocephalic. Pupils equal and reactive to light. Conjunctivae clear without exudate. Sclerae anicteric. Oral mucosa is moist. Lymph: No cervical, supraclavicular, or infraclavicular lymphadenopathy noted on palpation.  Neck: No palpable masses.  Cardiovascular: Regular rate & regular rhythm.  Respiratory: Clear to auscultation bilaterally. Chest expansion symmetric without accessory muscle use. Breathing non-labored.  GI: Soft, non-tender, nondistended abdomen. Normoactive bowel sounds. Midline surgical scar. Neuro: AAOx3. No focal deficits. Steady gait.  Psych: Normal mood and affect for situation. Extremities: No edema. No weakness. Skin: Warm and dry.    LABORATORY DATA: CBC Latest Ref Rng & Units 05/31/2017 11/27/2016 09/05/2016  WBC 4.0 - 10.5 K/uL 6.6 6.4 6.1  Hemoglobin 13.0 - 17.0 g/dL 12.5(L) 13.3 10.0(L)  Hematocrit 39.0 - 52.0 % 39.5 40.4 30.2(L)  Platelets 150 - 400 K/uL 275 225 179   CMP Latest Ref Rng & Units 05/31/2017 03/19/2017 11/27/2016  Glucose 65 - 99 mg/dL 100(H) 112(H) 88  BUN 6 - 20 mg/dL 13 14 15   Creatinine 0.61 - 1.24 mg/dL 0.85 0.85 0.84  Sodium 135 - 145 mmol/L 132(L) 135 134(L)  Potassium 3.5 - 5.1 mmol/L 4.4 4.5 4.2  Chloride 101 - 111 mmol/L 97(L) 99 97(L)  CO2 22 - 32 mmol/L 26 29 28   Calcium 8.9 - 10.3 mg/dL 9.4 9.2 9.3  Total Protein 6.5 - 8.1 g/dL 7.0 6.8 7.0  Total Bilirubin 0.3 - 1.2 mg/dL 0.2(L) 0.4 0.4  Alkaline Phos 38 - 126 U/L 44 - 46  AST 15 - 41 U/L 13(L) 10 15  ALT 17 - 63 U/L 15(L) 9 14(L)   Results for ASHTYN, FREILICH (MRN [id number])   Ref. Range 07/06/2016 09:55  TSH Latest Ref Range: 0.350 - 4.500 uIU/mL 1.887     DIAGNOSTIC IMAGING:  Restaging PET scan: 01/24/16    Most recent: CT abd/pelvis-03/28/16      ASSESSMENT & PLAN:  [name] is a pleasant 67 y.o. 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  He completed treatment on 09/30/15.  PET scan on 01/24/16 showed resolution of left tonsillar mass and left neck adenopathy.  Patient presents to cancer center for routine follow-up.    Stage IVA left tonsil cancer:  -Clinically, he is without evidence of disease recurrence based on physical exam/diagnostic imaging. No need for repeat imaging, unless clinically indicated.  -Continue follow up with ENT and rad-onc as scheduled.   Dispo:  -Return to cancer center in 6 months for continued follow-up with labs, sooner should he develop any new symptoms.  No orders of the defined types were placed in this encounter.      NCCN guidelines for surveillance of Head and Neck cancer recommends:  A. H+P every 1-3 months for year 1  B. H+P every 2-6 months for year 2  C. H+P every 4-8 months for years 3-5  D. H+P every year for years greater than 5  E. Imaging only as clinically indicated following baseline imaging study within 6 months of completion of therapy.   F. Chest imaging as clinically indicated for patients with smoking history.  G. Further re-imaging as indicated based on worrisome or equivocal signs/symptoms, smoking history, and areas inaccessible to clinical examination.  H. Routine annual imaging may be indicated in areas difficult to visualize on exam.  I. TSH every 6-12 months if neck irradiated.  J. Dental evaluation   1. Recommended for oral cavity and sites exposed to significant intraoral radiation treatment.  K. 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  L. Supportive care and rehabilitation   1. Speech/hearing and swallowing evaluation and rehabilitation as clinically indicated.   2. Nutritional evaluation and rehabilitation as clinically indicated until nutritional  status is stabilized.   3. Ongoing surveillance for depression   4. Smoking cessation and alcohol counseling as clinically indicated.  M. For response assessment immediately after chemoradiation or RT (see FOLL-A 2 of 2).  N. Integration of survivorship care and care plan within 1 year, complementary to ongoing involvement from a head and neck oncologist.

## 2017-05-31 NOTE — Telephone Encounter (Signed)
Patient called in with blood pressure that was taken at the cancer center today  137/84   Cb#: [phone]

## 2017-06-01 NOTE — Telephone Encounter (Signed)
Advised patient that this blood pressure is a good reading.  Continue to take medications, work on diet, decrease salt intake, and exercise.

## 2017-06-01 NOTE — Telephone Encounter (Signed)
Left message on phone informing.

## 2017-06-04 ENCOUNTER — Telehealth: Payer: Self-pay | Admitting: Family Medicine

## 2017-06-04 NOTE — Telephone Encounter (Signed)
FYI

## 2017-06-04 NOTE — Telephone Encounter (Signed)
Patient left message on voicemail that his BP was checked at [facility] during a surgical f/u it was 117/72 Cb# [phone]

## 2017-06-10 ENCOUNTER — Telehealth: Payer: Self-pay | Admitting: Family Medicine

## 2017-06-10 NOTE — Telephone Encounter (Signed)
Pt is calling in to advise that his BP is 147/80 --Does he need to keep his appt next week, please give him a call

## 2017-06-10 NOTE — Telephone Encounter (Signed)
Please advise 

## 2017-06-11 NOTE — Telephone Encounter (Signed)
Left message informing to keep appointment

## 2017-06-11 NOTE — Telephone Encounter (Signed)
Yes, still not to goal. Bring in readings and his cuff. Gwen Her. Mannie Stabile, MD

## 2017-06-16 LAB — LIPID PANEL
CHOL/HDL RATIO: 6.8 (calc) — AB (ref <5.0)
Cholesterol: 230 mg/dL — ABNORMAL HIGH (ref <200)
HDL: 34 mg/dL — AB (ref >40)
LDL Cholesterol (Calc): 135 mg/dL (calc) — ABNORMAL HIGH
NON-HDL CHOLESTEROL (CALC): 196 mg/dL — AB (ref <130)
Triglycerides: 394 mg/dL — ABNORMAL HIGH (ref <150)

## 2017-06-17 ENCOUNTER — Ambulatory Visit: Payer: Self-pay | Admitting: Family Medicine

## 2017-06-17 ENCOUNTER — Encounter: Payer: Self-pay | Admitting: Family Medicine

## 2017-06-17 ENCOUNTER — Encounter: Payer: BLUE CROSS/BLUE SHIELD | Attending: Family Medicine | Admitting: Nutrition

## 2017-06-17 ENCOUNTER — Other Ambulatory Visit: Payer: Self-pay

## 2017-06-17 ENCOUNTER — Encounter: Payer: Self-pay | Admitting: Nutrition

## 2017-06-17 ENCOUNTER — Ambulatory Visit: Payer: BLUE CROSS/BLUE SHIELD | Admitting: Family Medicine

## 2017-06-17 VITALS — Ht 70.0 in | Wt 177.0 lb

## 2017-06-17 VITALS — BP 130/72 | HR 82 | Temp 97.4°F | Resp 16 | Ht 70.0 in | Wt 177.2 lb

## 2017-06-17 DIAGNOSIS — R739 Hyperglycemia, unspecified: Secondary | ICD-10-CM | POA: Insufficient documentation

## 2017-06-17 DIAGNOSIS — E785 Hyperlipidemia, unspecified: Secondary | ICD-10-CM | POA: Diagnosis not present

## 2017-06-17 DIAGNOSIS — I10 Essential (primary) hypertension: Secondary | ICD-10-CM | POA: Diagnosis not present

## 2017-06-17 DIAGNOSIS — K219 Gastro-esophageal reflux disease without esophagitis: Secondary | ICD-10-CM | POA: Insufficient documentation

## 2017-06-17 DIAGNOSIS — E782 Mixed hyperlipidemia: Secondary | ICD-10-CM | POA: Insufficient documentation

## 2017-06-17 DIAGNOSIS — Z713 Dietary counseling and surveillance: Secondary | ICD-10-CM | POA: Diagnosis present

## 2017-06-17 DIAGNOSIS — K222 Esophageal obstruction: Secondary | ICD-10-CM | POA: Insufficient documentation

## 2017-06-17 DIAGNOSIS — K7030 Alcoholic cirrhosis of liver without ascites: Secondary | ICD-10-CM

## 2017-06-17 DIAGNOSIS — Z85819 Personal history of malignant neoplasm of unspecified site of lip, oral cavity, and pharynx: Secondary | ICD-10-CM

## 2017-06-17 MED ORDER — ASPIRIN EC 81 MG PO TBEC
81.0000 mg | DELAYED_RELEASE_TABLET | Freq: Every day | ORAL | Status: AC
Start: 2017-06-17 — End: ?

## 2017-06-17 NOTE — Patient Instructions (Signed)
Goals 1. Follow My Plate 50-75% of meal should be fresh fruits, vegetables and whole grains. 2. Increase fiber rich foods, whole grains, and more fresh fruits and vegetables that you can tolerate. 3. Cut out ice cream. :) 4. Try making a vegan cheese sauce with cashews, nutritional yeast to add to meats for a sauce instead of mayo based sauces. See recipe on line. 5. Freeze yoplait yogurt and eat with dinner as 'ice cream' instead of regular ice cream 6. Drink only water Get Cholesterol to less than 200, Triglycerides less than 150 and LDL below 100.

## 2017-06-17 NOTE — Progress Notes (Signed)
Medical Nutrition Therapy:  Appt start time: 1400 end time:  1500.   Assessment:  Primary concerns today: Cholesterol and Triglyceride issues. 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  PMH: HTN, Throat cancer,  S/p chemo/radiation and Alcohol abuse. Has been in  recovery 5+ years. Wt stable. H/o of cancer treatment=had PEG feedings for a long time,  s/p ostomy from ruptured colon. Currently healing from Incisional hernia surgery.   The cholesterol panel was not fasting this last time but levels were elevated the previous time also.  PCP Dr. Mannie Stabile. He is motivated to make whatever changes he needs to in order to improve his cholesterol. He admits that for a long time he was encouraged to add calories, protein and fat to all his foods for nutritional needs during and post op of his cancer. He has been eating a lot of ice cream lately and feels his levels are mostly related to that.                Current diet is fairly healthy and doesn't involve a lot of processed or fast foods. Cooks mostly at home for he and his mom. Has a history of eating vegan so likes lots of fruits and vegetables and whole grains. Likes Salmon and tuna.    TCHOL 230 mg/dl HDL 34 mg/dl LDL 135 mg/dl TG 394 mg/dl  Eats 2-3 meals per day. Physical activity: just starting back into walking for exercicse. Has to be careful due to incisional hernia.  Preferred Learning Style:    No preference indicated   Learning Readiness:   Ready  Change in progress   MEDICATIONS:   DIETARY INTAKE:   24-hr recall:  B ( AM): Blueberry pancakes and 2 slices bacon OR oatmeal and blueberry and yogurt, coffee. Snk ( AM):  L ( PM): Romaine kale salad with shrimp grilled and chicken, cesear dressing, cottage cheese, water Snk ( PM):  D ( PM): Salmon-mustard mayo sauce, asparagus, saffron rice, water Snk ( PM): ice cream 2 cup Beverages: water  Usual physical activity:ADL , walking  Estimated energy needs: 2000  calories 225 g carbohydrates 150 g protein 56 g fat  Progress Towards Goal(s):  In progress.   Nutritional Diagnosis:  NB-1.1 Food and nutrition-related knowledge deficit As related to Hyperlipemia.  As evidenced by TCHOL  230 mg/dl   TG 394 mg/dl     LDL 135 mg/dl     HDL 34 mg/dl    .    Intervention:  High Fiber Low Cholesterol Diet. Portion sizes, meal planning, avoiding processed foods, sweets, junk food. Low sodium diet due to HTN.    Goals 1. Follow My Plate 50-75% of meal should be fresh fruits, vegetables and whole grains. 2. Increase fiber rich foods, whole grains, and more fresh fruits and vegetables that you can tolerate. 3. Cut out ice cream. :) 4. Try making a vegan cheese sauce with cashews, nutritional yeast to add to meats for a sauce instead of mayo based sauces. See recipe on line. 5. Freeze yoplait yogurt and eat with dinner as 'ice cream' instead of regular ice cream 6. Drink only water Get Cholesterol to less than 200, Triglycerides less than 150 and LDL below 100.  Teaching Method Utilized:  Visual Auditory Hands on  Handouts given during visit include:  The Plate Method    Meal Plan Card  HIgh Fiber diet, Low Cholesterol Low TG diet, Low Sodium  Barriers to learning/adherence to lifestyle change: swallowing issues  from throat cancer. Needs to eat soft foods with moisture due to poor saliva.  Demonstrated degree of understanding via:  Teach Back   Monitoring/Evaluation:  Dietary intake, exercise, and body weight PRN. Encouraged him to contact me in future if needed.  He may benefit from Omega 3 Fish oils due to elevated TG if they aren't better at next visit.

## 2017-06-17 NOTE — Progress Notes (Signed)
Patient ID: Frank Castillo, male    DOB: Apr 09, 1951, 67 y.o.   MRN: [id number]  Chief Complaint  Patient presents with  . Follow-up  . Hypertension    Allergies Bee venom and Other  Subjective:   Frank Castillo is a 67 y.o. male who presents to [facility] today.  HPI Frank Castillo presents today for follow-up of his blood pressure.  Frank Castillo has been taking lisinopril each day as directed.  Frank Castillo denies any side effects or problems with the medication.  Frank Castillo reports that his blood pressures have been much better controlled.  Today we reviewed his blood pressures from his office visits with his specialist.  All of his blood pressures have been under 130/90.  Frank Castillo reports that Frank Castillo feels good.  She denies any chest pain, shortness of breath, or swelling in his extremities.  Frank Castillo saw his dentist about the ulcer in his mouth.  Frank Castillo was given some paste.  Reports that the ulcer is healing up well.  Frank Castillo does still have some pain in his teeth but is following up with the dentist.  Frank Castillo also has a follow-up with his ENT and will mention his symptoms and prior ulcer at that time.  Frank Castillo came in a couple days ago and got his cholesterol panel performed.  Frank Castillo was not fasting.  Frank Castillo is here to review that.  Frank Castillo is not currently on a statin due to his liver disease.  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  Frank Castillo is not drinking alcohol at this time.  Frank Castillo has no known viral infection of the liver.  Frank Castillo is followed by [facility] gastroenterology.  Frank Castillo reports that Frank Castillo is feeling good.  Energy level is good.  Denies any abdominal pain.  No problems with his mood.  Taking his medications as directed.    Past Medical History:  Diagnosis Date  . Cancer ([employer])   . Cirrhosis ([place])    Followed at [facility]  . GERD (gastroesophageal reflux disease)   . History of bowel resection   . History of tonsillectomy    Dr. Benjamine Mola, 06/2015  . Hypertension   . Iron deficiency anemia 05/07/2016  . Melanoma ([place])    followed by Dr.  Cleophas Dunker  . Portal hypertension ([place])   . Substance abuse ([place])    alcohol  . 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 Tonsillar mass    left    Past Surgical History:  Procedure Laterality Date  . BIOPSY  07/02/2016   Procedure: BIOPSY;  Surgeon: Daneil Dolin, MD;  [facility];  Service: Endoscopy;;  esophageal  . COLON SURGERY    . COLONOSCOPY WITH PROPOFOL N/A 07/02/2016   Procedure: COLONOSCOPY WITH PROPOFOL;  Surgeon: Daneil Dolin, MD;  [facility];  Service: Endoscopy;  Laterality: N/A;  9:45am  . COLOSTOMY TAKEDOWN N/A 09/02/2016   Procedure: LAPAROSCOPIC COLOSTOMY REVERSAL;  Surgeon: Leighton Ruff, MD;  Location: [facility];  Service: General;  Laterality: N/A;  . ESOPHAGOGASTRODUODENOSCOPY (EGD) WITH PROPOFOL N/A 07/02/2016   Procedure: ESOPHAGOGASTRODUODENOSCOPY (EGD) WITH PROPOFOL;  Surgeon: Daneil Dolin, MD;  [facility];  Service: Endoscopy;  Laterality: N/A;  . HERNIA REPAIR Right   . INCISIONAL HERNIA REPAIR  09/02/2016   Procedure: HERNIA REPAIR INCISIONAL;  Surgeon: Leighton Ruff, MD;  Location: [facility];  Service: General;;  . LAPAROTOMY N/A 03/28/2016   Procedure: EXPLORATORY LAPAROTOMY, SIGMOID COLECTOMY, COLOSTOMY;  Surgeon: Leighton Ruff, MD;  [facility];  Service: General;  Laterality: N/A;  . [name] DILATION N/A 07/02/2016   Procedure: [name] DILATION;  Surgeon: Daneil Dolin, MD;  [facility];  Service: Endoscopy;  Laterality: N/A;  . MOUTH SURGERY    . PORT-A-CATH REMOVAL  09/02/2016   Procedure: REMOVAL PORT-A-CATH;  Surgeon: Leighton Ruff, MD;  [facility];  Service: General;;  . REMOVAL OF GASTROSTOMY TUBE N/A 09/02/2016   Procedure: REMOVAL OF GASTROSTOMY TUBE;  Surgeon: Leighton Ruff, MD;  [facility];  Service: General;  Laterality: N/A;  . TONSILLECTOMY Left 07/09/2015   Procedure: LEFT TONSILLECTOMY;  Surgeon: Leta Baptist, MD;  Location: [place];  Service: ENT;  Laterality: Left;    . TONSILLECTOMY    . Z-PLASTY SCAR REVISION N/A 09/02/2016   Procedure: SCAR REVISION;  Surgeon: Leighton Ruff, MD;  [facility];  Service: General;  Laterality: N/A;    Family History  Problem Relation Age of Onset  . Macular degeneration Mother   . Dementia Mother   . Alcoholism Father   . Diabetes Maternal Aunt   . Diabetes Maternal Uncle   . Mental illness Sister      Social History   Socioeconomic History  . Marital status: Single    Spouse name: None  . Number of children: 0  . Years of education: None  . Highest education level: None  Social Needs  . Financial resource strain: None  . Food insecurity - worry: None  . Food insecurity - inability: None  . Transportation needs - medical: None  . Transportation needs - non-medical: None  Occupational History  . None  Tobacco Use  . Smoking status: Former Smoker    Packs/day: 0.50    Years: 30.00    Pack years: 15.00    Types: Cigarettes    Last attempt to quit: 11/22/2016    Years since quitting: 0.5  . Smokeless tobacco: Never Used  Substance and Sexual Activity  . Alcohol use: No    Alcohol/week: 0.0 oz    Comment: former abuse- quit 09/2011  . Drug use: No  . Sexual activity: None  Other Topics Concern  . None  Social History Narrative   Divorced.   No children.   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    Has been smoking off/on since age 50, quit smoking 11/2016.    Exercises minimally   Eats all food groups.   Wears seatbelt.   Mother lives with him, has dementia.   Attends AA meetings.     Review of Systems  Constitutional: Negative.   Respiratory: Negative.   Cardiovascular: Negative.   Gastrointestinal: Negative.   Psychiatric/Behavioral: Negative.    Current Outpatient Medications on File Prior to Visit  Medication Sig Dispense Refill  . cetirizine (ZYRTEC) 10 MG tablet Take 10 mg by mouth at  bedtime.     Marland Kitchen DEXILANT 60 MG capsule Take 1 capsule by mouth daily.  11  . diphenhydrAMINE (BENADRYL) 50 MG tablet Take 50 mg by mouth at bedtime as needed for sleep.    Marland Kitchen escitalopram (LEXAPRO) 20 MG tablet Take 1 tablet (20 mg total) by mouth daily. 90 tablet 3  . lisinopril (PRINIVIL,ZESTRIL) 10 MG tablet Take 1 tablet (10 mg total) by mouth daily. 90 tablet 3  . Melatonin 10 MG TABS Take 1 tablet by mouth at bedtime.    Marland Kitchen  RABEprazole (ACIPHEX) 20 MG tablet Take 1 tablet (20 mg total) by mouth daily. (Patient not taking: Reported on 06/17/2017) 30 tablet 11  . sodium fluoride (FLUORISHIELD) 1.1 % GEL dental gel Instill one drop of gel per tooth space of fluoride tray. Place over teeth for 5 minutes. Remove. Spit out excess. Repeat nightly. 125 mL 11  . triamcinolone cream (KENALOG) 0.1 % Apply 1 application 2 (two) times daily topically.  2  . vitamin B-12 (CYANOCOBALAMIN) 1000 MCG tablet Take 1,000 mcg by mouth daily.    . clotrimazole-betamethasone (LOTRISONE) cream Apply 1 application topically 2 (two) times daily as needed. For rash (Patient not taking: Reported on 06/17/2017) 30 g 0   Current Facility-Administered Medications on File Prior to Visit  Medication Dose Route Frequency Provider Last Rate Last Dose  . 0.9 %  sodium chloride infusion   Intravenous Continuous Kefalas, Thomas S, PA-C      . 0.9 %  sodium chloride infusion   Intravenous Continuous Kefalas, Thomas S, PA-C      . 0.9 %  sodium chloride infusion   Intravenous Continuous Penland, Larene Beach K, MD      . 0.9 %  sodium chloride infusion   Intravenous Continuous Penland, Larene Beach K, MD      . 0.9 %  sodium chloride infusion   Intravenous Continuous Penland, Kelby Fam, MD         Objective:   BP 130/72 (BP Location: Left Arm, Patient Position: Sitting, Cuff Size: Normal)   Pulse 82   Temp (!) 97.4 F (36.3 C) (Temporal)   Resp 16   Ht 5\' 10"  (1.778 m)   Wt 177 lb 4 oz (80.4 kg)   SpO2 98%   BMI 25.43 kg/m    Physical Exam  Constitutional: Frank Castillo appears well-developed and well-nourished.  HENT:  Head: Normocephalic and atraumatic.  Eyes: EOM are normal. Pupils are equal, round, and reactive to light. No scleral icterus.  Cardiovascular: Normal rate, regular rhythm and normal heart sounds.  Pulmonary/Chest: Effort normal and breath sounds normal.  Skin: Skin is warm and dry.  Psychiatric: Frank Castillo has a normal mood and affect. His behavior is normal.     Assessment and Plan  1. Essential hypertension Blood pressure well controlled on lisinopril.  Check electrolyte panel for potassium and creatinine today.  Continue medication to maintain blood pressure control.  Due to cardiac risk factors, recommend start aspirin 81 mg 1 p.o. daily.  Patient has never had a GI bleed or bleeding problems in the past.  Frank Castillo would be indicated for aspirin prophylaxis.  Risks versus benefits of this was discussed with patient today.  We will will institute aspirin therapy at this time. - Basic metabolic panel  2. Alcoholic cirrhosis of liver without ascites ([place]) Continue follow-up with [facility] gastroenterology.  At this time I will will attempt to call his doctor, Vergia Alcon, [facility] liver clinic.  We will discuss with them whether patient is a candidate for statin therapy.  I do believe this medication is indicated at this time due to his cardiovascular risk.   3. Hyperlipidemia, mixed 4. Dyslipidemia with high LDL and low HDL Patient with calculated ASCVD risk of 19.4% over the next 10 years.  His LDL is 135.  However his total cholesterol is 230 with a low HDL of 34. We will discuss with GI and see if possibility to start statin therapy.  Exercise and dietary modifications recommended to increase his HDL.  Frank Castillo does have a follow-up  with medical nutrition therapy today regarding his impaired fasting blood sugars.  Frank Castillo is also going to discuss low-cholesterol diet and ways to improve his cholesterol management.  Will contact  patient after discussion with [facility]. Return in about 3 months (around 09/15/2017). Caren Macadam, MD 06/17/2017

## 2017-06-22 ENCOUNTER — Telehealth: Payer: Self-pay | Admitting: Family Medicine

## 2017-06-22 NOTE — Telephone Encounter (Signed)
Frank Castillo called back after you left, she said it is 100% fine for him to start a statin.

## 2017-06-22 NOTE — Telephone Encounter (Signed)
Left message

## 2017-06-22 NOTE — Telephone Encounter (Signed)
Called Ridgeville Metheny ANP, who follows patient at [facility].  Left message on voicemail regarding whether from GI standpoint related to his liver cirrhosis if he could be started on low-dose statin to decrease his cardiovascular risk.  His calculated ASCVD risk is approximately 20% over the next 10 years.  He is interested in starting medication if it is okay with his GI doctors.  Message was left in information given as to where she could contact me at our office.  Please call patient and advised him that I have reached out to Vergia Alcon in regards to him being able to take a statin medication.  Advised him that I am waiting to hear back.  Find out when he has an appointment with him in case I do not hear back and he could address this with him at the visit.

## 2017-06-23 NOTE — Telephone Encounter (Signed)
Patient called back, left message, states next appointment is not until October. They have him on a yearly schedule and he does not know yet when his appointment is as they do not book out that far.

## 2017-06-25 ENCOUNTER — Telehealth: Payer: Self-pay | Admitting: Family Medicine

## 2017-06-25 DIAGNOSIS — E782 Mixed hyperlipidemia: Secondary | ICD-10-CM

## 2017-06-25 MED ORDER — ATORVASTATIN CALCIUM 20 MG PO TABS: 20.0000 mg | ORAL_TABLET | Freq: Every day | ORAL | 0 refills | Status: DC

## 2017-06-25 NOTE — Telephone Encounter (Signed)
Please call patient and advised that liver center said that he was fine to be placed on statin medication for his cholesterol.  Advised him that I have called in the atorvastatin 20 mg.  He is to take this medication at bedtime or in the evening.  Advised him we will plan on rechecking his cholesterol and liver function in 2-3 months.  Make sure he keeps his scheduled follow-up.  Advised him that if he was to have any problems or side effects with the medication to let us know sooner.

## 2017-06-25 NOTE — Telephone Encounter (Signed)
Left message per DPR 

## 2017-07-02 ENCOUNTER — Encounter: Payer: Self-pay | Admitting: Family Medicine

## 2017-07-02 LAB — BASIC METABOLIC PANEL
BUN: 13 mg/dL (ref 7–25)
CALCIUM: 9.5 mg/dL (ref 8.6–10.3)
CO2: 29 mmol/L (ref 20–32)
CREATININE: 0.88 mg/dL (ref 0.70–1.25)
Chloride: 101 mmol/L (ref 98–110)
GLUCOSE: 98 mg/dL (ref 65–139)
Potassium: 4.7 mmol/L (ref 3.5–5.3)
Sodium: 137 mmol/L (ref 135–146)

## 2017-07-12 ENCOUNTER — Telehealth: Payer: Self-pay | Admitting: Family Medicine

## 2017-07-12 NOTE — Telephone Encounter (Signed)
I called patient for more information. He states that the pain is not constant, it is intermittent. He describes it as sharp, breath taking. Like an ice pick is being stabbed into him. He states it will wake him up in the night when he rolls over. The pain is in the ostomy scar area. He denies any other abnormalities (lumps,bumps, bruising, color changes, etc). He does have a history of a hernia. The hernia was a result from the surgery to address a ruptured colon, for which he had the ostomy placed. He states the hernia was not in the scar area. He had the ostomy for 6 months. He states he has had the pain for a few days. It is not severe, he understand Dr. Mannie Stabile is out of the office today ad will return tomorrow. I informed him I would send this to her, but also to Dr. Meda Coffee in case there is an urgency to the matter. He states he is fine and if the pain becomes severe in the meantime he will head to the ED.

## 2017-07-12 NOTE — Telephone Encounter (Signed)
Noted  

## 2017-07-12 NOTE — Telephone Encounter (Signed)
Having a pain where the ostomy scar is, just started a few days ago---please call [phone]

## 2017-07-13 ENCOUNTER — Encounter: Payer: Self-pay | Admitting: Nutrition

## 2017-07-13 NOTE — Telephone Encounter (Signed)
Left message

## 2017-07-13 NOTE — Telephone Encounter (Signed)
Please advise to follow up in office or follow up with his surgery. Would need to be evaluated in office.  Please advise if he has any nausea, vomiting, fevers severe abdominal pain that he needs to go the emergency department to be evaluated.

## 2017-08-05 ENCOUNTER — Ambulatory Visit (INDEPENDENT_AMBULATORY_CARE_PROVIDER_SITE_OTHER): Payer: BLUE CROSS/BLUE SHIELD | Admitting: Otolaryngology

## 2017-08-05 ENCOUNTER — Other Ambulatory Visit (INDEPENDENT_AMBULATORY_CARE_PROVIDER_SITE_OTHER): Payer: Self-pay | Admitting: Otolaryngology

## 2017-08-05 DIAGNOSIS — D3709 Neoplasm of uncertain behavior of other specified sites of the oral cavity: Secondary | ICD-10-CM

## 2017-08-05 DIAGNOSIS — Z85819 Personal history of malignant neoplasm of unspecified site of lip, oral cavity, and pharynx: Secondary | ICD-10-CM | POA: Diagnosis not present

## 2017-08-12 DIAGNOSIS — C049 Malignant neoplasm of floor of mouth, unspecified: Secondary | ICD-10-CM | POA: Insufficient documentation

## 2017-08-17 ENCOUNTER — Other Ambulatory Visit (INDEPENDENT_AMBULATORY_CARE_PROVIDER_SITE_OTHER): Payer: Self-pay | Admitting: Otolaryngology

## 2017-08-17 DIAGNOSIS — C069 Malignant neoplasm of mouth, unspecified: Secondary | ICD-10-CM

## 2017-08-17 DIAGNOSIS — Z85818 Personal history of malignant neoplasm of other sites of lip, oral cavity, and pharynx: Secondary | ICD-10-CM

## 2017-08-24 ENCOUNTER — Ambulatory Visit (HOSPITAL_COMMUNITY)
Admission: RE | Admit: 2017-08-24 | Discharge: 2017-08-24 | Disposition: A | Discharge status: Home / Self Care | Payer: BLUE CROSS/BLUE SHIELD | Source: Ambulatory Visit | Attending: Otolaryngology | Admitting: Otolaryngology

## 2017-08-24 DIAGNOSIS — C099 Malignant neoplasm of tonsil, unspecified: Secondary | ICD-10-CM | POA: Insufficient documentation

## 2017-08-24 DIAGNOSIS — I2510 Atherosclerotic heart disease of native coronary artery without angina pectoris: Secondary | ICD-10-CM | POA: Diagnosis not present

## 2017-08-24 DIAGNOSIS — C069 Malignant neoplasm of mouth, unspecified: Secondary | ICD-10-CM

## 2017-08-24 DIAGNOSIS — I700 Atherosclerosis of aorta: Secondary | ICD-10-CM | POA: Diagnosis not present

## 2017-08-24 DIAGNOSIS — Z85818 Personal history of malignant neoplasm of other sites of lip, oral cavity, and pharynx: Secondary | ICD-10-CM

## 2017-08-24 LAB — GLUCOSE, CAPILLARY: GLUCOSE-CAPILLARY: 110 mg/dL — AB (ref 65–99)

## 2017-08-24 MED ORDER — FLUDEOXYGLUCOSE F - 18 (FDG) INJECTION
8.8000 | Freq: Once | INTRAVENOUS | Status: AC | PRN
  Administered 2017-08-24: 8.8 via INTRAVENOUS

## 2017-08-27 DIAGNOSIS — C4492 Squamous cell carcinoma of skin, unspecified: Secondary | ICD-10-CM | POA: Insufficient documentation

## 2017-09-13 DIAGNOSIS — K7460 Unspecified cirrhosis of liver: Secondary | ICD-10-CM | POA: Insufficient documentation

## 2017-09-13 DIAGNOSIS — K219 Gastro-esophageal reflux disease without esophagitis: Secondary | ICD-10-CM | POA: Insufficient documentation

## 2017-09-17 DIAGNOSIS — C069 Malignant neoplasm of mouth, unspecified: Secondary | ICD-10-CM | POA: Insufficient documentation

## 2017-09-24 ENCOUNTER — Other Ambulatory Visit: Payer: Self-pay | Admitting: Family Medicine

## 2017-09-24 DIAGNOSIS — E782 Mixed hyperlipidemia: Secondary | ICD-10-CM

## 2017-10-28 ENCOUNTER — Telehealth (HOSPITAL_COMMUNITY): Payer: Self-pay | Admitting: *Deleted

## 2017-10-28 ENCOUNTER — Encounter: Payer: Self-pay | Admitting: Family Medicine

## 2017-10-28 NOTE — Telephone Encounter (Signed)
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 Surgeon Francina Ames @ [facility] performed the surgery. Clear margins per pt post surgery. No role for radiation currently.

## 2017-10-29 ENCOUNTER — Encounter: Payer: Self-pay | Admitting: Family Medicine

## 2017-11-11 ENCOUNTER — Other Ambulatory Visit: Payer: Self-pay | Admitting: Gastroenterology

## 2017-11-17 ENCOUNTER — Telehealth: Payer: Self-pay | Admitting: Family Medicine

## 2017-11-17 NOTE — Telephone Encounter (Signed)
Patient left 2nd voicemail requesting a call back (I called him earlier, no answer, lvm) Cb#: [id number]/ [phone] No answer this past time at 3:14 pm.

## 2017-11-17 NOTE — Telephone Encounter (Signed)
Patient lvm requesting a call back to schedule an appt. Cb#: [phone]  NO answer.

## 2017-11-19 ENCOUNTER — Encounter: Payer: Self-pay | Admitting: Family Medicine

## 2017-11-19 ENCOUNTER — Ambulatory Visit: Payer: BLUE CROSS/BLUE SHIELD | Admitting: Family Medicine

## 2017-11-19 ENCOUNTER — Other Ambulatory Visit: Payer: Self-pay

## 2017-11-19 VITALS — BP 124/76 | HR 82 | Temp 98.7°F | Resp 12 | Ht 70.0 in | Wt 170.1 lb

## 2017-11-19 DIAGNOSIS — E782 Mixed hyperlipidemia: Secondary | ICD-10-CM | POA: Diagnosis not present

## 2017-11-19 DIAGNOSIS — I10 Essential (primary) hypertension: Secondary | ICD-10-CM

## 2017-11-19 DIAGNOSIS — Z23 Encounter for immunization: Secondary | ICD-10-CM | POA: Diagnosis not present

## 2017-11-19 DIAGNOSIS — Z87898 Personal history of other specified conditions: Secondary | ICD-10-CM | POA: Diagnosis not present

## 2017-11-19 DIAGNOSIS — Z720 Tobacco use: Secondary | ICD-10-CM | POA: Diagnosis not present

## 2017-11-19 DIAGNOSIS — Z79899 Other long term (current) drug therapy: Secondary | ICD-10-CM

## 2017-11-19 DIAGNOSIS — F1011 Alcohol abuse, in remission: Secondary | ICD-10-CM

## 2017-11-19 LAB — COMPLETE METABOLIC PANEL WITH GFR
AG Ratio: 1.9 (calc) (ref 1.0–2.5)
ALKALINE PHOSPHATASE (APISO): 47 U/L (ref 40–115)
ALT: 10 U/L (ref 9–46)
AST: 11 U/L (ref 10–35)
Albumin: 4.3 g/dL (ref 3.6–5.1)
BUN / CREAT RATIO: 21 (calc) (ref 6–22)
BUN: 14 mg/dL (ref 7–25)
CALCIUM: 9.4 mg/dL (ref 8.6–10.3)
CO2: 28 mmol/L (ref 20–32)
CREATININE: 0.68 mg/dL — AB (ref 0.70–1.25)
Chloride: 99 mmol/L (ref 98–110)
GFR, EST AFRICAN AMERICAN: 117 mL/min/{1.73_m2} (ref >=60)
GFR, EST NON AFRICAN AMERICAN: 101 mL/min/{1.73_m2} (ref >=60)
GLOBULIN: 2.3 g/dL (ref 1.9–3.7)
GLUCOSE: 103 mg/dL (ref 65–139)
Potassium: 4.6 mmol/L (ref 3.5–5.3)
SODIUM: 134 mmol/L — AB (ref 135–146)
Total Bilirubin: 0.3 mg/dL (ref 0.2–1.2)
Total Protein: 6.6 g/dL (ref 6.1–8.1)

## 2017-11-19 LAB — LIPID PANEL
Cholesterol: 144 mg/dL (ref <200)
HDL: 33 mg/dL — ABNORMAL LOW (ref >40)
Non-HDL Cholesterol (Calc): 111 mg/dL (calc) (ref <130)
TRIGLYCERIDES: 434 mg/dL — AB (ref <150)
Total CHOL/HDL Ratio: 4.4 (calc) (ref <5.0)

## 2017-11-19 MED ORDER — VARENICLINE TARTRATE 0.5 MG X 11 & 1 MG X 42 PO MISC: ORAL | 0 refills | Status: DC

## 2017-11-19 NOTE — Progress Notes (Signed)
Patient ID: Frank Castillo, male    DOB: 1951/03/09, 67 y.o.   MRN: [id number]  Chief Complaint  Patient presents with  . Nicotine Dependence    started again    Allergies Bee venom and Other  Subjective:   Frank Castillo is a 67 y.o. male who presents to [facility] today.  HPI Here for follow up. Had recent surgery for SCC of the mandible.Was seen by Dr. Benjamine Mola and he did the biopsy and then had surgery at [facility]. Margins from surgery were completely clear. 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  During all of the stress from this diagnosis he reports that he started back smoking.  He has been smoking about 1 pack/day for the past month.  He did not start back drinking alcohol.  He reports that he needs help to quit.  He has previously used Chantix in the past to quit smoking.  He reports that it worked well.  He did not have any side effects.  He does not feel depressed or down.  He does feel sad and can start to feel sorry for himself because of all that he has been through but he reports that he is doing fairly well.  He is having some trouble with eating due to the surgery and he has lost some weight but he is back to eating better at this time.  He denies any suicidal or homicidal ideations.  He does not feel he needs medication for depression.  His energy level is improving.  He would like to start back exercising.  He is talking with his [place] sponsor.  He is motivated to quit smoking.  He is still being followed by gastroenterology and dermatology.  He is continue to take his cholesterol medicine.  He has not had his lipids checked since this was started.  His LDL goal is less than 70.  He is still trying to eat a healthy diet.  He is not doing much exercise at this time but is building back up to it.   Past Medical History:  Diagnosis Date  . Cancer ([place])   . Cirrhosis ([place])    Followed at [facility]  . GERD (gastroesophageal reflux disease)    . History of bowel resection   . History of tonsillectomy    Dr. Benjamine Mola, 06/2015  . Hypertension   . Iron deficiency anemia 05/07/2016  . Melanoma ([place])    followed by Dr. Cleophas Dunker  . Portal hypertension ([place])   . Substance abuse ([place])    alcohol  . 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 Tonsillar mass    left    Past Surgical History:  Procedure Laterality Date  . BIOPSY  07/02/2016   Procedure: BIOPSY;  Surgeon: Daneil Dolin, MD;  [facility];  Service: Endoscopy;;  esophageal  . COLON SURGERY    . COLONOSCOPY WITH PROPOFOL N/A 07/02/2016   Procedure: COLONOSCOPY WITH PROPOFOL;  Surgeon: Daneil Dolin, MD;  [facility];  Service: Endoscopy;  Laterality: N/A;  9:45am  . COLOSTOMY TAKEDOWN N/A 09/02/2016   Procedure: LAPAROSCOPIC COLOSTOMY REVERSAL;  Surgeon: Leighton Ruff, MD;  Location: [facility];  Service: General;  Laterality: N/A;  . ESOPHAGOGASTRODUODENOSCOPY (EGD) WITH PROPOFOL N/A 07/02/2016   Procedure: ESOPHAGOGASTRODUODENOSCOPY (EGD) WITH PROPOFOL;  Surgeon: Daneil Dolin, MD;  [facility];  Service: Endoscopy;  Laterality: N/A;  . HERNIA REPAIR Right   . INCISIONAL  HERNIA REPAIR  09/02/2016   Procedure: HERNIA REPAIR INCISIONAL;  Surgeon: Leighton Ruff, MD;  [facility];  Service: General;;  . LAPAROTOMY N/A 03/28/2016   Procedure: EXPLORATORY LAPAROTOMY, SIGMOID COLECTOMY, COLOSTOMY;  Surgeon: Leighton Ruff, MD;  [facility];  Service: General;  Laterality: N/A;  [name] DILATION N/A 07/02/2016   Procedure: Keturah Shavers;  Surgeon: Daneil Dolin, MD;  [facility];  Service: Endoscopy;  Laterality: N/A;  . MOUTH SURGERY    . PORT-A-CATH REMOVAL  09/02/2016   Procedure: REMOVAL PORT-A-CATH;  Surgeon: Leighton Ruff, MD;  [facility];  Service: General;;  . REMOVAL OF GASTROSTOMY TUBE N/A 09/02/2016   Procedure: REMOVAL OF GASTROSTOMY TUBE;  Surgeon: Leighton Ruff, MD;  [facility];  Service: General;   Laterality: N/A;  . TONSILLECTOMY Left 07/09/2015   Procedure: LEFT TONSILLECTOMY;  Surgeon: Leta Baptist, MD;  Location: [place];  Service: ENT;  Laterality: Left;  . TONSILLECTOMY    . Z-PLASTY SCAR REVISION N/A 09/02/2016   Procedure: SCAR REVISION;  Surgeon: Leighton Ruff, MD;  [facility];  Service: General;  Laterality: N/A;    Family History  Problem Relation Age of Onset  . Macular degeneration Mother   . Dementia Mother   . Alcoholism Father   . Diabetes Maternal Aunt   . Diabetes Maternal Uncle   . Mental illness Sister      Social History   Socioeconomic History  . Marital status: Single    Spouse name: Not on file  . Number of children: 0  . Years of education: Not on file  . Highest education level: Not on file  Occupational History  . Not on file  Social Needs  . Financial resource strain: Not on file  . Food insecurity:    Worry: Not on file    Inability: Not on file  . Transportation needs:    Medical: Not on file    Non-medical: Not on file  Tobacco Use  . Smoking status: Current Every Day Smoker    Packs/day: 0.50    Years: 30.00    Pack years: 15.00    Types: Cigarettes    Last attempt to quit: 11/22/2016    Years since quitting: 0.9  . Smokeless tobacco: Never Used  Substance and Sexual Activity  . Alcohol use: No    Alcohol/week: 0.0 oz    Comment: former abuse- quit 09/2011  . Drug use: No  . Sexual activity: Not on file  Lifestyle  . Physical activity:    Days per week: Not on file    Minutes per session: Not on file  . Stress: Not on file  Relationships  . Social connections:    Talks on phone: Not on file    Gets together: Not on file    Attends religious service: Not on file    Active member of club or organization: Not on file    Attends meetings of clubs or organizations: Not on file    Relationship status: Not on file  Other Topics Concern  . Not on file  Social History Narrative   Divorced.   No children.     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    Has been smoking off/on since age 46, quit smoking 11/2016.    Exercises minimally   Eats all food groups.  Wears seatbelt.   Mother lives with him, has dementia.   Attends AA meetings.    Current Outpatient Medications on File Prior to Visit  Medication Sig Dispense Refill  . aspirin EC 81 MG tablet Take 1 tablet (81 mg total) by mouth daily.    Marland Kitchen atorvastatin (LIPITOR) 20 MG tablet TAKE ONE TABLET BY MOUTH EVERY DAY 90 tablet 0  . cetirizine (ZYRTEC) 10 MG tablet Take 10 mg by mouth at bedtime.     Marland Kitchen DEXILANT 60 MG capsule Take 1 capsule by mouth daily.  11  . diphenhydrAMINE (BENADRYL) 50 MG tablet Take 50 mg by mouth at bedtime as needed for sleep.    Marland Kitchen escitalopram (LEXAPRO) 20 MG tablet Take 1 tablet (20 mg total) by mouth daily. 90 tablet 3  . lisinopril (PRINIVIL,ZESTRIL) 10 MG tablet Take 1 tablet (10 mg total) by mouth daily. 90 tablet 3  . Melatonin 10 MG TABS Take 1 tablet by mouth at bedtime.    . RABEprazole (ACIPHEX) 20 MG tablet Take 1 tablet (20 mg total) by mouth daily. 30 tablet 11  . sodium fluoride (FLUORISHIELD) 1.1 % GEL dental gel Instill one drop of gel per tooth space of fluoride tray. Place over teeth for 5 minutes. Remove. Spit out excess. Repeat nightly. 125 mL 11  . triamcinolone cream (KENALOG) 0.1 % Apply 1 application 2 (two) times daily topically.  2  . vitamin B-12 (CYANOCOBALAMIN) 1000 MCG tablet Take 1,000 mcg by mouth daily.       Review of Systems  Constitutional: Negative for appetite change, chills, fever and unexpected weight change.  HENT: Negative for trouble swallowing and voice change.   Eyes: Negative for visual disturbance.  Respiratory: Negative for cough, chest tightness, shortness of breath and wheezing.   Cardiovascular: Negative for chest pain, palpitations and leg swelling.  Gastrointestinal:  Negative for abdominal pain, diarrhea, nausea and vomiting.  Genitourinary: Negative for decreased urine volume, dysuria and frequency.  Skin: Negative for rash.  Neurological: Negative for dizziness, tremors, syncope, facial asymmetry, weakness and headaches.  Hematological: Negative for adenopathy. Does not bruise/bleed easily.  Psychiatric/Behavioral: Negative for behavioral problems, decreased concentration, dysphoric mood, hallucinations, sleep disturbance and suicidal ideas.     Objective:   BP 124/76 (BP Location: Left Arm, Patient Position: Sitting, Cuff Size: Normal)   Pulse 82   Temp 98.7 F (37.1 C) (Temporal)   Resp 12   Ht 5\' 10"  (1.778 m)   Wt 170 lb 1.9 oz (77.2 kg)   SpO2 97%   BMI 24.41 kg/m   Physical Exam  Constitutional: He is oriented to person, place, and time. He appears well-developed and well-nourished. No distress.  Eyes: Pupils are equal, round, and reactive to light. Conjunctivae and EOM are normal. No scleral icterus.  Cardiovascular: Normal rate, regular rhythm and normal heart sounds.  Pulmonary/Chest: Effort normal and breath sounds normal. No stridor. No respiratory distress. He has no wheezes. He has no rales. He exhibits no tenderness.  Neurological: He is alert and oriented to person, place, and time. No cranial nerve deficit.  Skin: Skin is warm and dry. He is not diaphoretic.  Psychiatric: He has a normal mood and affect. His behavior is normal. Judgment and thought content normal.  Vitals reviewed.  Depression screen [facility] 2/9 11/19/2017 06/17/2017 05/20/2017 01/22/2017 08/02/2015  Decreased Interest 0 0 0 0 0  Down, Depressed, Hopeless 1 0 0 0 0  PHQ - 2 Score 1 0 0 0 0  Assessment and Plan   1. Tobacco abuse The 5 A's Model for treating Tobacco Use and Dependence was used today. I have identified and documented tobacco use status for this patient. I have urged the patient to quit tobacco use. At this time, the patient is willing and  ready to attempt to quit. We have discussed medication options to aid in smoking cessation including nicotine replacement therapy (NRT), zyban, and chantix. We have also discussed behavioral modifications, lifestyle changes, and patient support options to aid in tobacco cessation success. Patient was congratulated on their desire to make this positive change for their health. Patient instructed to keep their follow up to ensure success.  We discussed chantix for smoking cessation. Discussed that serious neuropsychiatric adverse events have been reported in patients treated with this medication, including changes in mood, psychosis, hallucinations, paranoia, delusion, homicidal ideation, aggression, hostility, agitation, anxiety, suicide ideation, suicide attempt, and completed suicide. Advised patient that if taking this medication and any symptoms occur that the patient should STOP taking chantix and contact a healthcare provider via the ED or our office. Discussed that other common adverse reactions can occur with this medication such as nausea, abnormal dreams, constipation, gas, and vomiting. Patient told that upon starting medication that they should use caution when driving or operating machinery or engaging in hazardous activity until they know how this medication will affect them. Patient understands the risk of this medication and voiced understanding. Agrees to keep follow up appointments and follow the treatment plan.   - varenicline (CHANTIX STARTING MONTH PAK) 0.5 MG X 11 & 1 MG X 42 tablet; Take one 0.5 mg tablet by mouth once daily for 3 days, then increase to one 0.5 mg tablet twice daily for 4 days, then increase to one 1 mg tablet twice daily.  Dispense: 53 tablet; Refill: 0  2. Hyperlipidemia, mixed RTC to check labs.  Hyperlipidemia and the associated risk of ASCVD were discussed today.Continue statin. Need to see if LDL is at goal.  Risks of stains discussed including myopathy,  rhabdomyoloysis, liver problems, increased risk of diabetes discussed. We discussed heart healthy diet, lifestyle modifications, risk factor modifications, and adherence to the recommended treatment plan. We discussed the need to periodically monitor lipid panel and liver function tests while on statin therapy.   - Lipid panel  3. Essential hypertension Stable.  Blood pressure well controlled.  4. High risk medication use Check due to statin use. - COMPLETE METABOLIC PANEL WITH GFR  5. Immunization due Shingrix  vaccine #2 given today. - Varicella-zoster vaccine IM  Patient reports that he is feeling better with his mood but he had been down for the past month.  He was told if his mood worsens or if he develops any suicidal or homicidal ideations that he should call or contact medical help.  He voiced understanding.  He was congratulated on his desire to quit smoking and he was also congratulated on his continued sobriety. He is scheduled office visits with ENT as directed. Return in about 1 month (around 12/17/2017). Caren Macadam, MD 11/19/2017

## 2017-11-24 ENCOUNTER — Telehealth: Payer: Self-pay | Admitting: Family Medicine

## 2017-11-24 NOTE — Telephone Encounter (Signed)
Please call and ask if he has been taking his cholesterol medication. Advise that his TG were to elevated to calculate his LDL. Please let me know if he has been taking medication qhs. If he has not, advise to restart.  Gwen Her. Mannie Stabile, MD

## 2017-11-24 NOTE — Telephone Encounter (Signed)
Spoke with patient and he says he is taking his Lipitor every morning.

## 2017-11-26 IMAGING — US IR US GUIDE VASC ACCESS RIGHT
1 series · 1 of 1 positions shown · non-contrast
Comparison: None.

INDICATION: 64-year-old with squamous cell carcinoma of the left tonsil. Patient
scheduled for Port-A-Cath placement and gastrostomy tube placement.
Plan for Port-A-Cath placement prior to gastrostomy tube.

EXAM:
FLUOROSCOPIC AND ULTRASOUND GUIDED PLACEMENT OF A SUBCUTANEOUS PORT

[Series 1: ir fluoro/shunt/fist · 1 of 1 slices shown]
[im 1/1]
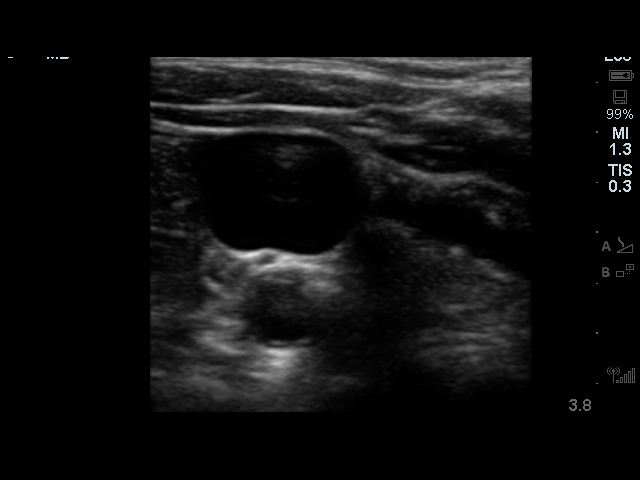

[1 of 1 positions shown; findings below may reference images not displayed]

MEDICATIONS:
Ancef 2 g; The antibiotic was administered within an appropriate
time interval prior to skin puncture.

ANESTHESIA/SEDATION:
Versed 4.0 mg IV; Fentanyl 100 mcg IV;

Moderate Sedation Time:  25 minutes

The patient was continuously monitored during the procedure by the
interventional radiology nurse under my direct supervision.

FLUOROSCOPY TIME:  12 seconds, 3 mGy

COMPLICATIONS:
None immediate.

PROCEDURE:
The procedure, risks, benefits, and alternatives were explained to
the patient. Questions regarding the procedure were encouraged and
answered. The patient understands and consents to the procedure.

Patient was placed supine on the interventional table. Ultrasound
confirmed a patent right internal jugular vein. The right chest and
neck were cleaned with a skin antiseptic and a sterile drape was
placed. Maximal barrier sterile technique was utilized including
caps, mask, sterile gowns, sterile gloves, sterile drape, hand
hygiene and skin antiseptic. The right neck was anesthetized with 1%
lidocaine. Small incision was made in the right neck with a blade.
Micropuncture set was placed in the right internal jugular vein with
ultrasound guidance. The micropuncture wire was used for measurement
purposes. The right chest was anesthetized with 1% lidocaine with
epinephrine. #15 blade was used to make an incision and a
subcutaneous port pocket was formed. 8 french Power Port was
assembled. Subcutaneous tunnel was formed with a stiff tunneling
device. The port catheter was brought through the subcutaneous
tunnel. The port was placed in the subcutaneous pocket. The
micropuncture set was exchanged for a peel-away sheath. The catheter
was placed through the peel-away sheath and the tip was positioned
at the superior cavoatrial junction. Catheter placement was
confirmed with fluoroscopy. The port was accessed and flushed with
heparinized saline. The port pocket was closed using two layers of
absorbable sutures and Dermabond. The vein skin site was closed
using a single layer of absorbable suture and Dermabond. Sterile
dressings were applied. Patient tolerated the procedure well without
an immediate complication. Ultrasound and fluoroscopic images were
taken and saved for this procedure.
IMPRESSION: Placement of a subcutaneous port device. Catheter tip at the
superior cavoatrial junction.

## 2017-11-29 ENCOUNTER — Encounter (HOSPITAL_COMMUNITY): Payer: Self-pay | Admitting: Hematology

## 2017-11-29 ENCOUNTER — Other Ambulatory Visit: Payer: Self-pay

## 2017-11-29 ENCOUNTER — Inpatient Hospital Stay (HOSPITAL_COMMUNITY): Payer: Medicare Other

## 2017-11-29 ENCOUNTER — Inpatient Hospital Stay (HOSPITAL_COMMUNITY): Payer: Medicare Other | Attending: Hematology | Admitting: Hematology

## 2017-11-29 VITALS — BP 114/74 | HR 75 | Temp 97.9°F | Resp 16 | Wt 172.4 lb

## 2017-11-29 DIAGNOSIS — Z9221 Personal history of antineoplastic chemotherapy: Secondary | ICD-10-CM | POA: Diagnosis not present

## 2017-11-29 DIAGNOSIS — C069 Malignant neoplasm of mouth, unspecified: Secondary | ICD-10-CM

## 2017-11-29 DIAGNOSIS — C099 Malignant neoplasm of tonsil, unspecified: Secondary | ICD-10-CM

## 2017-11-29 DIAGNOSIS — Z8579 Personal history of other malignant neoplasms of lymphoid, hematopoietic and related tissues: Secondary | ICD-10-CM | POA: Diagnosis not present

## 2017-11-29 DIAGNOSIS — Z87891 Personal history of nicotine dependence: Secondary | ICD-10-CM | POA: Diagnosis not present

## 2017-11-29 DIAGNOSIS — Z923 Personal history of irradiation: Secondary | ICD-10-CM

## 2017-11-29 DIAGNOSIS — Z79899 Other long term (current) drug therapy: Secondary | ICD-10-CM | POA: Diagnosis not present

## 2017-11-29 LAB — TSH: TSH: 2.978 u[IU]/mL (ref 0.350–4.500)

## 2017-11-29 MED ORDER — ATORVASTATIN CALCIUM 40 MG PO TABS: 40.0000 mg | ORAL_TABLET | Freq: Every day | ORAL | 0 refills | Status: AC

## 2017-11-29 NOTE — Progress Notes (Signed)
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 Isidore Moos.      08/21/2015 - 09/10/2015 Chemotherapy    Cisplatin every 21 days with XRT.      08/28/2015 Adverse Reaction    Tinnitis, Cisplatin-induced.      09/11/2015 - 09/30/2015 Chemotherapy    Carboplatin/5FU      09/20/2015 - 09/21/2015 Hospital Admission    Intractable nausea/vomiting. Severe Mucositis      10/17/2015 - 10/20/2015 Hospital Admission    Esophageal/oral candidasis. Shingles. Fever. Chemotherapy induced pancytopenia      01/24/2016 PET scan    Resolution of the left tonsillar mass and left neck adenopathy. No findings for residual or locally recurrent tumor, adenopathy or distant metastatic disease.      03/28/2016 - 04/08/2016 Hospital Admission    Admitted with severe abdominal pain. Found to diffuse peritonitis from perforated sigmoid colon requiring colostomy. Discharged to [facility] in [place].            CANCER STAGING: Cancer Staging Tonsil cancer ([place]) Staging form: Pharynx - Oropharynx, AJCC 7th Edition - Clinical: Stage IVA (T2, N2b, M0) - Signed by Eppie Gibson, MD on 08/02/2015    INTERVAL HISTORY:  Frank Castillo 67 y.o. male returns for routine follow-up for left tonsillar cancer. Patient had surgery at the end of April 2019 for removal of cancer at [facility]. Patient has been eating better and trying to eat soft foods. Patient has  no problems swallowing foods. Patient recently started smoking again due to the stress of the surgery. Primary care doctor is helping him quit again. Denies nausea, vomiting, or diarrhea. Denis any new pains. Overall, he tells me he has been feeling pretty well. Energy levels 75%; appetite 100%. Patient is still following gastroenterology and dermatology.       REVIEW OF SYSTEMS:  Review of Systems  Constitutional: Positive for fatigue.  HENT:   Positive for mouth sores.        Trouble chewing solids  Eyes: Negative.   Respiratory: Negative.   Cardiovascular: Negative.   Gastrointestinal: Positive for constipation.  Endocrine: Negative.   Genitourinary: Negative.    Musculoskeletal: Negative.   Skin: Negative.   Neurological: Negative.   Hematological: Negative.   Psychiatric/Behavioral: Negative.      PAST MEDICAL/SURGICAL HISTORY:  Past Medical History:  Diagnosis Date  . Cancer ([place])   . Cirrhosis ([place])    Followed at [facility]  . GERD (gastroesophageal reflux disease)   . History of bowel resection   . History of tonsillectomy    Dr. Benjamine Mola, 06/2015  . Hypertension   . Iron deficiency anemia 05/07/2016  . Melanoma ([place])    followed by Dr. Cleophas Dunker  . Portal hypertension ([place])   . Substance abuse ([place])    alcohol  . Tonsillar cancer ([place]) 06/2015  SCC  . Tonsillar mass    left   Past Surgical History:  Procedure Laterality Date  . BIOPSY  07/02/2016   Procedure: BIOPSY;  Surgeon: Daneil Dolin, MD;  [facility];  Service: Endoscopy;;  esophageal  . COLON SURGERY    . COLONOSCOPY WITH PROPOFOL N/A 07/02/2016   Procedure: COLONOSCOPY WITH PROPOFOL;  Surgeon: Daneil Dolin, MD;  [facility];  Service: Endoscopy;  Laterality: N/A;  9:45am  . COLOSTOMY TAKEDOWN N/A 09/02/2016   Procedure: LAPAROSCOPIC COLOSTOMY REVERSAL;  Surgeon: Leighton Ruff, MD;  Location: [facility];  Service: General;  Laterality: N/A;  . ESOPHAGOGASTRODUODENOSCOPY (EGD) WITH  PROPOFOL N/A 07/02/2016   Procedure: ESOPHAGOGASTRODUODENOSCOPY (EGD) WITH PROPOFOL;  Surgeon: Daneil Dolin, MD;  [facility];  Service: Endoscopy;  Laterality: N/A;  . HERNIA REPAIR Right   . INCISIONAL HERNIA REPAIR  09/02/2016   Procedure: HERNIA REPAIR INCISIONAL;  Surgeon: Leighton Ruff, MD;  [facility];  Service: General;;  . LAPAROTOMY N/A 03/28/2016   Procedure: EXPLORATORY LAPAROTOMY, SIGMOID COLECTOMY, COLOSTOMY;  Surgeon: Leighton Ruff, MD;  [facility];  Service: General;  Laterality: N/A;  [name] DILATION N/A 07/02/2016   Procedure: Keturah Shavers;  Surgeon: Daneil Dolin, MD;  [facility];  Service: Endoscopy;  Laterality: N/A;  . MOUTH SURGERY    . PORT-A-CATH REMOVAL  09/02/2016   Procedure: REMOVAL PORT-A-CATH;  Surgeon: Leighton Ruff, MD;  [facility];  Service: General;;  . REMOVAL OF GASTROSTOMY TUBE N/A 09/02/2016   Procedure: REMOVAL OF GASTROSTOMY TUBE;  Surgeon: Leighton Ruff, MD;  [facility];  Service: General;  Laterality: N/A;  . TONSILLECTOMY Left 07/09/2015   Procedure: LEFT TONSILLECTOMY;  Surgeon: Leta Baptist, MD;  Location: [facility];  Service: ENT;  Laterality: Left;  . TONSILLECTOMY    . Z-PLASTY SCAR REVISION N/A 09/02/2016   Procedure: SCAR REVISION;  Surgeon: Leighton Ruff, MD;  [facility];  Service: General;  Laterality: N/A;     SOCIAL HISTORY:  Social History   Socioeconomic History  . Marital status: Single    Spouse name: Not on file  . Number of children: 0  . Years of education: Not on file  . Highest education level: Not on file  Occupational History  . Not on file  Social Needs  . Financial resource strain: Not on file  . Food insecurity:    Worry: Not on file    Inability: Not on file  . Transportation needs:    Medical: Not on file    Non-medical: Not on file  Tobacco Use  . Smoking status: Former Smoker    Packs/day: 0.50    Years: 30.00    Pack years: 15.00     Types: Cigarettes    Last attempt to quit: 11/22/2016    Years since quitting: 1.0  . Smokeless tobacco: Never Used  Substance and Sexual Activity  . Alcohol use: No    Alcohol/week: 0.0 oz    Comment: former abuse- quit 09/2011  . Drug use: No  . Sexual activity: Not on file  Lifestyle  . Physical activity:    Days per week: Not on file    Minutes per session: Not on file  . Stress: Not on file  Relationships  . Social connections:    Talks on phone: Not on file    Gets together: Not on file    Attends religious service: Not on file  Active member of club or organization: Not on file    Attends meetings of clubs or organizations: Not on file    Relationship status: Not on file  . Intimate partner violence:    Fear of current or ex partner: Not on file    Emotionally abused: Not on file    Physically abused: Not on file    Forced sexual activity: Not on file  Other Topics Concern  . Not on file  Social History Narrative   Divorced.   No children.   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    Has been smoking off/on since age 35, quit smoking 11/2016.    Exercises minimally   Eats all food groups.   Wears seatbelt.   Mother lives with him, has dementia.   Attends AA meetings.     FAMILY HISTORY:  Family History  Problem Relation Age of Onset  . Macular degeneration Mother   . Dementia Mother   . Alcoholism Father   . Diabetes Maternal Aunt   . Diabetes Maternal Uncle   . Mental illness Sister     CURRENT MEDICATIONS:  Outpatient Encounter Medications as of 11/29/2017  Medication Sig  . aspirin EC 81 MG tablet Take 1 tablet (81 mg total) by mouth daily.  Marland Kitchen atorvastatin (LIPITOR) 20 MG tablet TAKE ONE TABLET BY MOUTH EVERY DAY  . cetirizine (ZYRTEC) 10 MG tablet Take 10 mg by mouth at bedtime.   Marland Kitchen DEXILANT 60 MG capsule Take 1 capsule by mouth daily.  . diphenhydrAMINE  (BENADRYL) 50 MG tablet Take 50 mg by mouth at bedtime as needed for sleep.  Marland Kitchen escitalopram (LEXAPRO) 20 MG tablet Take 1 tablet (20 mg total) by mouth daily.  Marland Kitchen lisinopril (PRINIVIL,ZESTRIL) 10 MG tablet Take 1 tablet (10 mg total) by mouth daily.  . Melatonin 10 MG TABS Take 1 tablet by mouth at bedtime.  . RABEprazole (ACIPHEX) 20 MG tablet Take 1 tablet (20 mg total) by mouth daily.  . sodium fluoride (FLUORISHIELD) 1.1 % GEL dental gel Instill one drop of gel per tooth space of fluoride tray. Place over teeth for 5 minutes. Remove. Spit out excess. Repeat nightly.  . triamcinolone cream (KENALOG) 0.1 % Apply 1 application 2 (two) times daily topically.  . varenicline (CHANTIX STARTING MONTH PAK) 0.5 MG X 11 & 1 MG X 42 tablet Take one 0.5 mg tablet by mouth once daily for 3 days, then increase to one 0.5 mg tablet twice daily for 4 days, then increase to one 1 mg tablet twice daily.  . vitamin B-12 (CYANOCOBALAMIN) 1000 MCG tablet Take 1,000 mcg by mouth daily.   Facility-Administered Encounter Medications as of 11/29/2017  Medication  . 0.9 %  sodium chloride infusion  . 0.9 %  sodium chloride infusion  . 0.9 %  sodium chloride infusion  . 0.9 %  sodium chloride infusion  . 0.9 %  sodium chloride infusion    ALLERGIES:  Allergies  Allergen Reactions  . Bee Venom Swelling  . Other     Opthalmic mycin irritates eyes.      PHYSICAL EXAM:  ECOG Performance status: 1  Vitals:   11/29/17 1043  BP: 114/74  Pulse: 75  Resp: 16  Temp: 97.9 F (36.6 C)  SpO2: 99%   Filed Weights   11/29/17 1043  Weight: 172 lb 6.4 oz (78.2 kg)  Physical Exam HEENT: Left marginal mandibulectomy site is well-healed.  No suspicious masses in the tonsillar region or in the oral cavity.  No palpable adenopathy in the neck region.  LABORATORY DATA:  I have reviewed the labs as listed.  CBC    Component Value Date/Time   WBC 6.6 05/31/2017 1351   RBC 4.53 05/31/2017 1351   HGB 12.5 (L)  05/31/2017 1351   HGB 11.5 (L) 10/21/2011 1614   HCT 39.5 05/31/2017 1351   HCT 34.6 (L) 10/21/2011 1614   PLT 275 05/31/2017 1351   PLT 209 10/21/2011 1614   MCV 87.2 05/31/2017 1351   MCV 101 (H) 10/21/2011 1614   MCH 27.6 05/31/2017 1351   MCHC 31.6 05/31/2017 1351   RDW 14.7 05/31/2017 1351   RDW 14.0 10/21/2011 1614   LYMPHSABS 0.8 05/31/2017 1351   LYMPHSABS 1.9 10/20/2011 0609   MONOABS 0.8 05/31/2017 1351   MONOABS 1.1 (H) 10/20/2011 0609   EOSABS 0.2 05/31/2017 1351   EOSABS 0.1 10/20/2011 0609   BASOSABS 0.1 05/31/2017 1351   BASOSABS 0.1 10/20/2011 0609   CMP Latest Ref Rng & Units 11/19/2017 07/02/2017 05/31/2017  Glucose 65 - 139 mg/dL 103 98 100(H)  BUN 7 - 25 mg/dL 14 13 13   Creatinine 0.70 - 1.25 mg/dL 0.68(L) 0.88 0.85  Sodium 135 - 146 mmol/L 134(L) 137 132(L)  Potassium 3.5 - 5.3 mmol/L 4.6 4.7 4.4  Chloride 98 - 110 mmol/L 99 101 97(L)  CO2 20 - 32 mmol/L 28 29 26   Calcium 8.6 - 10.3 mg/dL 9.4 9.5 9.4  Total Protein 6.1 - 8.1 g/dL 6.6 - 7.0  Total Bilirubin 0.2 - 1.2 mg/dL 0.3 - 0.2(L)  Alkaline Phos 38 - 126 U/L - - 44  AST 10 - 35 U/L 11 - 13(L)  ALT 9 - 46 U/L 10 - 15(L)     ASSESSMENT & PLAN:   Tonsil cancer ([facility]) 1.  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 - Does not report any major dysphagia.  Today examination was within normal limits.  2.  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 Benjamine Mola.  He was then referred to Dr. Nicolette Bang at [facility] who did a marginal mandibulectomy with primary closure on 09/20/2017.  This showed a 2.1 x 0.9 x 0.4 cm squamous cell carcinoma with mandibular bone invasion.  Wound  has healed up quite nicely.  A PET CT scan on 08/24/2017 was negative for metastatic adenopathy. -TSH level today was within normal limits.  He has an appointment at [facility] in August with a repeat CT scan of the neck. -We will see him back in 6 months for follow-up.  We will check a TSH level along with a physical exam.      Orders placed this encounter:  Orders Placed This Encounter  Procedures  . TSH  . CBC with Differential/Platelet  . Comprehensive metabolic panel  . TSH      Derek Jack, [place] [phone]

## 2017-11-29 NOTE — Assessment & Plan Note (Signed)
1.  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 - Does not report any major dysphagia.  Today examination was within normal limits.  2.  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 Benjamine Mola.  He was then referred to Dr. Nicolette Bang at [facility] who did a marginal mandibulectomy with primary closure on 09/20/2017.  This showed a 2.1 x 0.9 x 0.4 cm squamous cell carcinoma with mandibular bone invasion.  Wound has healed up quite nicely.  A PET CT scan on 08/24/2017 was negative for metastatic adenopathy. -TSH level today was within normal limits.  He has an appointment at [facility] in August with a repeat CT scan of the neck. -We will see him back in 6 months for follow-up.  We will check a TSH level along with a physical exam.

## 2017-11-29 NOTE — Telephone Encounter (Signed)
Advise that I have increased his dose to lipitor 40 mg, best to take it at night if he can take it them. More effacacious to take it at night. Will need to recheck in 2-3 months.  Sent into the pharmacy. He can take two of the 20mg  tablets at the same time each night to use them up.  Gwen Her. Mannie Stabile, MD

## 2017-11-30 NOTE — Telephone Encounter (Signed)
Spoke with patient and advised of Dr. Haglers recommendations with verbal understanding.  

## 2017-12-09 DIAGNOSIS — C049 Malignant neoplasm of floor of mouth, unspecified: Secondary | ICD-10-CM | POA: Diagnosis not present

## 2017-12-09 DIAGNOSIS — M436 Torticollis: Secondary | ICD-10-CM | POA: Diagnosis not present

## 2017-12-09 DIAGNOSIS — Z6824 Body mass index (BMI) 24.0-24.9, adult: Secondary | ICD-10-CM | POA: Diagnosis not present

## 2017-12-09 DIAGNOSIS — C099 Malignant neoplasm of tonsil, unspecified: Secondary | ICD-10-CM | POA: Diagnosis not present

## 2017-12-13 ENCOUNTER — Telehealth: Payer: Self-pay | Admitting: Internal Medicine

## 2017-12-13 NOTE — Telephone Encounter (Signed)
Spoke with pt, pt asked for a [employer]. Coupon was mailed to pt. Pt is aware that medicare insurance doesn't get coupon coverage. Pt said he has a new rx insurance. Coupon mailed to pt.

## 2017-12-13 NOTE — Telephone Encounter (Signed)
[place], HE WANTS TO KNOW IF WE HAVE COUPONS FOR DEXILANT

## 2017-12-13 NOTE — Telephone Encounter (Signed)
Lmom, waiting on a return call.  

## 2017-12-16 NOTE — Telephone Encounter (Signed)
Pt didn't receive Dexialnt coupon in the mail. Pt is going to pick up a coupon in office with 1 sample.

## 2017-12-17 ENCOUNTER — Telehealth: Payer: Self-pay | Admitting: Family Medicine

## 2017-12-17 DIAGNOSIS — F17200 Nicotine dependence, unspecified, uncomplicated: Secondary | ICD-10-CM

## 2017-12-17 MED ORDER — VARENICLINE TARTRATE 1 MG PO TABS: 1.0000 mg | ORAL_TABLET | Freq: Two times a day (BID) | ORAL | 1 refills | Status: DC

## 2017-12-17 NOTE — Telephone Encounter (Signed)
New prescription sent in.

## 2017-12-17 NOTE — Telephone Encounter (Signed)
Patient is requesting refill for chantix Cb# [phone]

## 2017-12-17 NOTE — Telephone Encounter (Signed)
Please put in a refill for Chantix continuation pack, use as directed, dispense #1, 1 refill.

## 2017-12-20 NOTE — Telephone Encounter (Signed)
Spoke with patient and let him know that new script for Chantix has been called in.

## 2017-12-27 DIAGNOSIS — L820 Inflamed seborrheic keratosis: Secondary | ICD-10-CM | POA: Diagnosis not present

## 2017-12-27 DIAGNOSIS — L309 Dermatitis, unspecified: Secondary | ICD-10-CM | POA: Diagnosis not present

## 2017-12-27 DIAGNOSIS — D485 Neoplasm of uncertain behavior of skin: Secondary | ICD-10-CM | POA: Diagnosis not present

## 2017-12-27 DIAGNOSIS — Z85828 Personal history of other malignant neoplasm of skin: Secondary | ICD-10-CM | POA: Diagnosis not present

## 2018-01-18 ENCOUNTER — Ambulatory Visit: Payer: Self-pay | Admitting: Family Medicine

## 2018-01-18 DIAGNOSIS — Z85818 Personal history of malignant neoplasm of other sites of lip, oral cavity, and pharynx: Secondary | ICD-10-CM | POA: Diagnosis not present

## 2018-01-18 DIAGNOSIS — Z87891 Personal history of nicotine dependence: Secondary | ICD-10-CM | POA: Diagnosis not present

## 2018-01-18 DIAGNOSIS — C039 Malignant neoplasm of gum, unspecified: Secondary | ICD-10-CM | POA: Insufficient documentation

## 2018-01-18 DIAGNOSIS — C031 Malignant neoplasm of lower gum: Secondary | ICD-10-CM | POA: Diagnosis not present

## 2018-01-18 DIAGNOSIS — Z08 Encounter for follow-up examination after completed treatment for malignant neoplasm: Secondary | ICD-10-CM | POA: Diagnosis not present

## 2018-01-19 ENCOUNTER — Other Ambulatory Visit: Payer: Self-pay

## 2018-01-19 ENCOUNTER — Ambulatory Visit (INDEPENDENT_AMBULATORY_CARE_PROVIDER_SITE_OTHER): Payer: Medicare Other | Admitting: Family Medicine

## 2018-01-19 ENCOUNTER — Encounter: Payer: Self-pay | Admitting: Family Medicine

## 2018-01-19 VITALS — BP 134/80 | HR 100 | Temp 98.6°F | Resp 12 | Ht 70.0 in | Wt 170.1 lb

## 2018-01-19 DIAGNOSIS — Z0000 Encounter for general adult medical examination without abnormal findings: Secondary | ICD-10-CM

## 2018-01-19 DIAGNOSIS — Z720 Tobacco use: Secondary | ICD-10-CM

## 2018-01-19 DIAGNOSIS — Z23 Encounter for immunization: Secondary | ICD-10-CM | POA: Diagnosis not present

## 2018-01-19 DIAGNOSIS — E782 Mixed hyperlipidemia: Secondary | ICD-10-CM | POA: Diagnosis not present

## 2018-01-19 MED ORDER — VARENICLINE TARTRATE 1 MG PO TABS: 1.0000 mg | ORAL_TABLET | Freq: Two times a day (BID) | ORAL | 2 refills | Status: DC

## 2018-01-19 NOTE — Progress Notes (Signed)
Subjective:   Frank Castillo is a 67 y.o. male who presents for a Welcome to Medicare exam.   Review of Systems: Patient denies any chest pain, shortness of breath, swelling in his extremities.  He reports that he is doing well.  Mood is good.  Denies feeling down, depressed, or hopeless.   Review of systems is otherwise negative unless stated above. Cardiac Risk Factors include: advanced age (>88men, >83 women);dyslipidemia;smoking/ tobacco exposure;male gender;hypertension     Objective:    Today's Vitals   01/19/18 0913  BP: 134/80  Pulse: 100  Resp: 12  Temp: 98.6 F (37 C)  TempSrc: Temporal  SpO2: 98%  Weight: 170 lb 1.3 oz (77.1 kg)  Height: 5\' 10"  (1.778 m)   Body mass index is 24.4 kg/m.  Medications Outpatient Encounter Medications as of 01/19/2018  Medication Sig  . aspirin EC 81 MG tablet Take 1 tablet (81 mg total) by mouth daily.  Marland Kitchen atorvastatin (LIPITOR) 40 MG tablet Take 1 tablet (40 mg total) by mouth daily.  . cetirizine (ZYRTEC) 10 MG tablet Take 10 mg by mouth at bedtime.   Marland Kitchen DEXILANT 60 MG capsule Take 1 capsule by mouth daily.  . diphenhydrAMINE (BENADRYL) 50 MG tablet Take 50 mg by mouth at bedtime as needed for sleep.  Marland Kitchen escitalopram (LEXAPRO) 20 MG tablet Take 1 tablet (20 mg total) by mouth daily.  Marland Kitchen lisinopril (PRINIVIL,ZESTRIL) 10 MG tablet Take 1 tablet (10 mg total) by mouth daily.  . Melatonin 10 MG TABS Take 1 tablet by mouth at bedtime.  . sodium fluoride (FLUORISHIELD) 1.1 % GEL dental gel Instill one drop of gel per tooth space of fluoride tray. Place over teeth for 5 minutes. Remove. Spit out excess. Repeat nightly.  . triamcinolone cream (KENALOG) 0.1 % Apply 1 application 2 (two) times daily topically.  . vitamin B-12 (CYANOCOBALAMIN) 1000 MCG tablet Take 1,000 mcg by mouth daily.  . varenicline (CHANTIX CONTINUING MONTH PAK) 1 MG tablet Take 1 tablet (1 mg total) by mouth 2 (two) times daily.  . [DISCONTINUED] RABEprazole (ACIPHEX)  20 MG tablet Take 1 tablet (20 mg total) by mouth daily. (Patient not taking: Reported on 01/19/2018)  . [DISCONTINUED] varenicline (CHANTIX CONTINUING MONTH PAK) 1 MG tablet Take 1 tablet (1 mg total) by mouth 2 (two) times daily. (Patient not taking: Reported on 01/19/2018)  . [DISCONTINUED] varenicline (CHANTIX STARTING MONTH PAK) 0.5 MG X 11 & 1 MG X 42 tablet Take one 0.5 mg tablet by mouth once daily for 3 days, then increase to one 0.5 mg tablet twice daily for 4 days, then increase to one 1 mg tablet twice daily. (Patient not taking: Reported on 01/19/2018)   Facility-Administered Encounter Medications as of 01/19/2018  Medication  . 0.9 %  sodium chloride infusion  . 0.9 %  sodium chloride infusion  . 0.9 %  sodium chloride infusion  . 0.9 %  sodium chloride infusion  . 0.9 %  sodium chloride infusion     History: Past Medical History:  Diagnosis Date  . Cancer ([place])   . Cirrhosis ([place])    Followed at [facility]  . GERD (gastroesophageal reflux disease)   . History of bowel resection   . History of tonsillectomy    Dr. Benjamine Mola, 06/2015  . Hypertension   . Iron deficiency anemia 05/07/2016  . Melanoma ([place])    followed by Dr. Cleophas Dunker  . Portal hypertension ([place])   . Substance abuse ([place])    alcohol  .  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 Tonsillar mass    left   Past Surgical History:  Procedure Laterality Date  . BIOPSY  07/02/2016   Procedure: BIOPSY;  Surgeon: Daneil Dolin, MD;  [facility];  Service: Endoscopy;;  esophageal  . COLON SURGERY    . COLONOSCOPY WITH PROPOFOL N/A 07/02/2016   Procedure: COLONOSCOPY WITH PROPOFOL;  Surgeon: Daneil Dolin, MD;  [facility];  Service: Endoscopy;  Laterality: N/A;  9:45am  . COLOSTOMY TAKEDOWN N/A 09/02/2016   Procedure: LAPAROSCOPIC COLOSTOMY REVERSAL;  Surgeon: Leighton Ruff, MD;  Location: [facility];  Service: General;  Laterality: N/A;  . ESOPHAGOGASTRODUODENOSCOPY (EGD) WITH PROPOFOL N/A 07/02/2016    Procedure: ESOPHAGOGASTRODUODENOSCOPY (EGD) WITH PROPOFOL;  Surgeon: Daneil Dolin, MD;  [facility];  Service: Endoscopy;  Laterality: N/A;  . HERNIA REPAIR Right   . INCISIONAL HERNIA REPAIR  09/02/2016   Procedure: HERNIA REPAIR INCISIONAL;  Surgeon: Leighton Ruff, MD;  [facility];  Service: General;;  . LAPAROTOMY N/A 03/28/2016   Procedure: EXPLORATORY LAPAROTOMY, SIGMOID COLECTOMY, COLOSTOMY;  Surgeon: Leighton Ruff, MD;  [facility];  Service: General;  Laterality: N/A;  [name] DILATION N/A 07/02/2016   Procedure: Keturah Shavers;  Surgeon: Daneil Dolin, MD;  [facility];  Service: Endoscopy;  Laterality: N/A;  . MOUTH SURGERY    . PORT-A-CATH REMOVAL  09/02/2016   Procedure: REMOVAL PORT-A-CATH;  Surgeon: Leighton Ruff, MD;  [facility];  Service: General;;  . REMOVAL OF GASTROSTOMY TUBE N/A 09/02/2016   Procedure: REMOVAL OF GASTROSTOMY TUBE;  Surgeon: Leighton Ruff, MD;  [facility];  Service: General;  Laterality: N/A;  . TONSILLECTOMY Left 07/09/2015   Procedure: LEFT TONSILLECTOMY;  Surgeon: Leta Baptist, MD;  Location: [place];  Service: ENT;  Laterality: Left;  . TONSILLECTOMY    . Z-PLASTY SCAR REVISION N/A 09/02/2016   Procedure: SCAR REVISION;  Surgeon: Leighton Ruff, MD;  [facility];  Service: General;  Laterality: N/A;    Family History  Problem Relation Age of Onset  . Macular degeneration Mother   . Dementia Mother   . Alcoholism Father   . Diabetes Maternal Aunt   . Diabetes Maternal Uncle   . Mental illness Sister    Social History   Occupational History  . Not on file  Tobacco Use  . Smoking status: Former Smoker    Packs/day: 0.50    Years: 30.00    Pack years: 15.00    Types: Cigarettes    Last attempt to quit: 11/22/2016    Years since quitting: 1.1  . Smokeless tobacco: Never Used  Substance and Sexual Activity  . Alcohol use: No    Alcohol/week: 0.0 standard drinks    Comment: former  abuse- quit 09/2011  . Drug use: No  . Sexual activity: Not on file   Tobacco Counseling Counseling given: Yes   Immunizations and Health Maintenance Immunization History  Administered Date(s) Administered  . Influenza,inj,Quad PF,6+ Mos 03/07/2015, 02/27/2016, 01/22/2017, 01/19/2018  . Influenza-Unspecified 03/02/2015  . Pneumococcal Conjugate-13 01/19/2018  . Pneumococcal Polysaccharide-23 03/29/2016  . Tdap 03/16/2017  . Zoster Recombinat (Shingrix) 03/16/2017, 11/19/2017   Health Maintenance Due  Topic Date Due  . INFLUENZA VACCINE  12/23/2017    Activities of Daily Living In your present state of health, do you have any difficulty performing the following activities: 01/19/2018  Hearing? N  Vision? N  Difficulty concentrating or  making decisions? N  Walking or climbing stairs? N  Dressing or bathing? N  Doing errands, shopping? N  Preparing Food and eating ? N  Using the Toilet? N  In the past six months, have you accidently leaked urine? N  Do you have problems with loss of bowel control? N  Managing your Medications? N  Managing your Finances? N  Some recent data might be hidden    Physical Exam   Vital signs reviewed.  Heart regular rate and rhythm.  Lungs clear to auscultation bilaterally.  Normocephalic atraumatic.  Pupils equally round reactive to light.  Extraocular muscles intact.  Conjunctiva clear.  No scleral icterus noted.  No cervical lymphadenopathy noted.  Abdomen is soft, nondistended, and nontender.  Scars are consistent with previous surgeries.  Bowel sounds are normal.  Cranial nerves II through XII are grossly intact.  Skin with evidence of photodamage.  Mood is euthymic.  Affect congruent with mood.  No suicidal or homicidal ideations.  Thought content goal directed without delusions, phobias, obsessions, or compulsions. Advanced Directives: Patient does have healthcare power of attorney and a living will.  He will bring those to our office and we  will copy those and scanned into chart.      Assessment:    This is a routine wellness  examination for this patient .   Vision/Hearing screen  Visual Acuity Screening   Right eye Left eye Both eyes  Without correction: 20 70  20 25  With correction:  20 25     Dietary issues and exercise activities discussed:  Current Exercise Habits: Home exercise routine, Type of exercise: walking, Time (Minutes): 35, Frequency (Times/Week): 5, Weekly Exercise (Minutes/Week): 175, Intensity: Moderate, Exercise limited by: None identified  Goals   None     Depression Screen PHQ 2/9 Scores 01/19/2018 11/19/2017 06/17/2017 05/20/2017  PHQ - 2 Score 0 1 0 0     Fall Risk Fall Risk  01/19/2018  Falls in the past year? No  Number falls in past yr: -    Cognitive Function MMSE - Mini Mental State Exam 01/19/2018  Orientation to time 5  Orientation to Place 5  Registration 3  Attention/ Calculation 5  Recall 3  Language- name 2 objects 2  Language- repeat 1  Language- follow 3 step command 3  Language- read & follow direction 1  Write a sentence 1  Copy design 1  Total score 30     6CIT Screen 01/19/2018  What Year? 0 points  What month? 0 points  What time? 0 points  Count back from 20 0 points  Months in reverse 0 points  Repeat phrase 0 points  Total Score 0    Patient Care Team: Caren Macadam, MD as PCP - General (Family Medicine) Gala Romney, Cristopher Estimable, MD as Consulting Physician (Gastroenterology)     Plan:    1. Well adult exam Welcome to  Medicare physical completed today. Age-appropriate anticipatory guidance was given and discussed with patient. Continue visits with hematology/oncology, radiation oncology, ear nose and throat, gastroenterology, liver clinic.  2. Tobacco abuse We will continue Chantix for the next 2 months. We discussed chantix for smoking cessation. Discussed that serious neuropsychiatric adverse events have been reported in patients treated with this  medication, including changes in mood, psychosis, hallucinations, paranoia, delusion, homicidal ideation, aggression, hostility, agitation, anxiety, suicide ideation, suicide attempt, and completed suicide. Advised patient that if taking this medication and any symptoms occur that the patient should STOP taking  chantix and contact a healthcare provider via the ED or our office. Discussed that other common adverse reactions can occur with this medication such as nausea, abnormal dreams, constipation, gas, and vomiting. Patient told that upon starting medication that they should use caution when driving or operating machinery or engaging in hazardous activity until they know how this medication will affect them. Patient understands the risk of this medication and voiced understanding. Agrees to keep follow up appointments and follow the treatment plan.   - varenicline (CHANTIX CONTINUING MONTH PAK) 1 MG tablet; Take 1 tablet (1 mg total) by mouth 2 (two) times daily.  Dispense: 60 tablet; Refill: 2  3. Hyperlipidemia, mixed Cholesterol medication was increased due to uncontrolled levels.  Will check today.  Also monitor liver function. Hyperlipidemia and the associated risk of ASCVD were discussed today. Primary vs. Secondary prevention of ASCVD were discussed and how it relates to patient morbidity, mortality, and quality of life. Shared decision making with patient including the risks of statins vs.benefits of ASCVD risk reduction discussed.  Risks of stains discussed including myopathy, rhabdomyoloysis, liver problems, increased risk of diabetes discussed. We discussed heart healthy diet, lifestyle modifications, risk factor modifications, and adherence to the recommended treatment plan. We discussed the need to periodically monitor lipid panel and liver function tests while on statin therapy.   - Hepatic function panel - Lipid panel  4. Encounter for Medicare annual wellness exam Completed.   5. Need  for immunization against influenza Done.  - EKG 12-Lead - Flu Vaccine QUAD 36+ mos IM  I have personally reviewed and noted the following in the patient's chart:   . Medical and social history . Use of alcohol, tobacco or illicit drugs  . Current medications and supplements . Functional ability and status . Nutritional status . Physical activity . Advanced directives . List of other physicians . Hospitalizations, surgeries, and ER visits in previous 12 months . Vitals . Screenings to include cognitive, depression, and falls . Referrals and appointments  In addition, I have reviewed and discussed with patient certain preventive protocols, quality metrics, and best practice recommendations. A personalized care plan for preventive services as well as general preventive health recommendations were provided to patient.    Caren Macadam, MD 01/19/2018

## 2018-01-20 DIAGNOSIS — E782 Mixed hyperlipidemia: Secondary | ICD-10-CM | POA: Diagnosis not present

## 2018-01-20 LAB — HEPATIC FUNCTION PANEL
AG Ratio: 1.6 (calc) (ref 1.0–2.5)
ALKALINE PHOSPHATASE (APISO): 46 U/L (ref 40–115)
ALT: 11 U/L (ref 9–46)
AST: 12 U/L (ref 10–35)
Albumin: 4.2 g/dL (ref 3.6–5.1)
BILIRUBIN TOTAL: 0.4 mg/dL (ref 0.2–1.2)
Bilirubin, Direct: 0.1 mg/dL (ref 0.0–0.2)
Globulin: 2.6 g/dL (calc) (ref 1.9–3.7)
Indirect Bilirubin: 0.3 mg/dL (calc) (ref 0.2–1.2)
TOTAL PROTEIN: 6.8 g/dL (ref 6.1–8.1)

## 2018-01-20 LAB — LIPID PANEL
CHOLESTEROL: 109 mg/dL (ref <200)
HDL: 36 mg/dL — ABNORMAL LOW (ref >40)
LDL Cholesterol (Calc): 54 mg/dL (calc)
Non-HDL Cholesterol (Calc): 73 mg/dL (calc) (ref <130)
TRIGLYCERIDES: 106 mg/dL (ref <150)
Total CHOL/HDL Ratio: 3 (calc) (ref <5.0)

## 2018-01-25 ENCOUNTER — Encounter: Payer: Self-pay | Admitting: Family Medicine

## 2018-02-09 ENCOUNTER — Telehealth: Payer: Self-pay | Admitting: Family Medicine

## 2018-02-09 NOTE — Telephone Encounter (Signed)
Please call the PT--asking for Abby or Dr

## 2018-02-09 NOTE — Telephone Encounter (Signed)
Spoke with patient. He was calling for refills on Chantix however he has 2 refills on his current prescription. Patient verbalized understanding.

## 2018-02-14 ENCOUNTER — Ambulatory Visit (INDEPENDENT_AMBULATORY_CARE_PROVIDER_SITE_OTHER): Payer: Medicare Other | Admitting: Otolaryngology

## 2018-02-14 DIAGNOSIS — Z85818 Personal history of malignant neoplasm of other sites of lip, oral cavity, and pharynx: Secondary | ICD-10-CM | POA: Diagnosis not present

## 2018-02-23 ENCOUNTER — Other Ambulatory Visit: Payer: Self-pay | Admitting: Family Medicine

## 2018-03-06 ENCOUNTER — Other Ambulatory Visit: Payer: Self-pay | Admitting: Family Medicine

## 2018-03-06 DIAGNOSIS — F419 Anxiety disorder, unspecified: Secondary | ICD-10-CM

## 2018-03-07 ENCOUNTER — Encounter: Payer: Self-pay | Admitting: Internal Medicine

## 2018-03-09 DIAGNOSIS — K1379 Other lesions of oral mucosa: Secondary | ICD-10-CM | POA: Diagnosis not present

## 2018-03-09 DIAGNOSIS — Z85818 Personal history of malignant neoplasm of other sites of lip, oral cavity, and pharynx: Secondary | ICD-10-CM | POA: Diagnosis not present

## 2018-03-09 DIAGNOSIS — Z08 Encounter for follow-up examination after completed treatment for malignant neoplasm: Secondary | ICD-10-CM | POA: Diagnosis not present

## 2018-03-09 DIAGNOSIS — Z9221 Personal history of antineoplastic chemotherapy: Secondary | ICD-10-CM | POA: Diagnosis not present

## 2018-03-09 DIAGNOSIS — C049 Malignant neoplasm of floor of mouth, unspecified: Secondary | ICD-10-CM | POA: Diagnosis not present

## 2018-03-09 DIAGNOSIS — Z923 Personal history of irradiation: Secondary | ICD-10-CM | POA: Diagnosis not present

## 2018-03-09 DIAGNOSIS — Z6825 Body mass index (BMI) 25.0-25.9, adult: Secondary | ICD-10-CM | POA: Diagnosis not present

## 2018-03-13 ENCOUNTER — Other Ambulatory Visit: Payer: Self-pay | Admitting: Family Medicine

## 2018-03-21 DIAGNOSIS — F1911 Other psychoactive substance abuse, in remission: Secondary | ICD-10-CM | POA: Diagnosis not present

## 2018-03-21 DIAGNOSIS — E782 Mixed hyperlipidemia: Secondary | ICD-10-CM | POA: Diagnosis not present

## 2018-03-21 DIAGNOSIS — I10 Essential (primary) hypertension: Secondary | ICD-10-CM | POA: Diagnosis not present

## 2018-03-21 DIAGNOSIS — F321 Major depressive disorder, single episode, moderate: Secondary | ICD-10-CM | POA: Diagnosis not present

## 2018-03-21 DIAGNOSIS — Z720 Tobacco use: Secondary | ICD-10-CM | POA: Diagnosis not present

## 2018-03-21 MED FILL — FLUORISHIELD 1.1% GEL: 1.1 % | 90 days supply | Qty: 342 | Fill #1

## 2018-04-05 DIAGNOSIS — D3709 Neoplasm of uncertain behavior of other specified sites of the oral cavity: Secondary | ICD-10-CM | POA: Diagnosis not present

## 2018-04-14 ENCOUNTER — Ambulatory Visit (INDEPENDENT_AMBULATORY_CARE_PROVIDER_SITE_OTHER): Payer: Self-pay | Admitting: Otolaryngology

## 2018-04-18 ENCOUNTER — Ambulatory Visit (INDEPENDENT_AMBULATORY_CARE_PROVIDER_SITE_OTHER): Payer: Medicare Other | Admitting: Otolaryngology

## 2018-04-18 DIAGNOSIS — D3709 Neoplasm of uncertain behavior of other specified sites of the oral cavity: Secondary | ICD-10-CM | POA: Diagnosis not present

## 2018-04-19 DIAGNOSIS — I10 Essential (primary) hypertension: Secondary | ICD-10-CM | POA: Diagnosis not present

## 2018-04-19 DIAGNOSIS — E782 Mixed hyperlipidemia: Secondary | ICD-10-CM | POA: Diagnosis not present

## 2018-04-19 DIAGNOSIS — E781 Pure hyperglyceridemia: Secondary | ICD-10-CM | POA: Diagnosis not present

## 2018-04-19 DIAGNOSIS — F321 Major depressive disorder, single episode, moderate: Secondary | ICD-10-CM | POA: Diagnosis not present

## 2018-04-25 DIAGNOSIS — K8080 Other cholelithiasis without obstruction: Secondary | ICD-10-CM | POA: Diagnosis not present

## 2018-04-25 DIAGNOSIS — K709 Alcoholic liver disease, unspecified: Secondary | ICD-10-CM | POA: Diagnosis not present

## 2018-04-25 DIAGNOSIS — K8020 Calculus of gallbladder without cholecystitis without obstruction: Secondary | ICD-10-CM | POA: Diagnosis not present

## 2018-04-26 DIAGNOSIS — Z85818 Personal history of malignant neoplasm of other sites of lip, oral cavity, and pharynx: Secondary | ICD-10-CM | POA: Diagnosis not present

## 2018-04-26 DIAGNOSIS — Z8589 Personal history of malignant neoplasm of other organs and systems: Secondary | ICD-10-CM | POA: Diagnosis not present

## 2018-04-26 DIAGNOSIS — Z08 Encounter for follow-up examination after completed treatment for malignant neoplasm: Secondary | ICD-10-CM | POA: Diagnosis not present

## 2018-04-28 ENCOUNTER — Encounter: Payer: Self-pay | Admitting: Internal Medicine

## 2018-05-30 ENCOUNTER — Inpatient Hospital Stay (HOSPITAL_COMMUNITY): Payer: Medicare Other | Attending: Hematology

## 2018-05-30 DIAGNOSIS — Z79899 Other long term (current) drug therapy: Secondary | ICD-10-CM | POA: Insufficient documentation

## 2018-05-30 DIAGNOSIS — C099 Malignant neoplasm of tonsil, unspecified: Secondary | ICD-10-CM

## 2018-05-30 DIAGNOSIS — Z7982 Long term (current) use of aspirin: Secondary | ICD-10-CM | POA: Diagnosis not present

## 2018-05-30 DIAGNOSIS — I10 Essential (primary) hypertension: Secondary | ICD-10-CM | POA: Insufficient documentation

## 2018-05-30 DIAGNOSIS — C090 Malignant neoplasm of tonsillar fossa: Secondary | ICD-10-CM | POA: Insufficient documentation

## 2018-05-30 DIAGNOSIS — Z87891 Personal history of nicotine dependence: Secondary | ICD-10-CM | POA: Diagnosis not present

## 2018-05-30 DIAGNOSIS — Z923 Personal history of irradiation: Secondary | ICD-10-CM | POA: Diagnosis not present

## 2018-05-30 DIAGNOSIS — Z9221 Personal history of antineoplastic chemotherapy: Secondary | ICD-10-CM | POA: Insufficient documentation

## 2018-05-30 LAB — CBC WITH DIFFERENTIAL/PLATELET
Abs Immature Granulocytes: 0.06 10*3/uL (ref 0.00–0.07)
BASOS ABS: 0.1 10*3/uL (ref 0.0–0.1)
Basophils Relative: 1 %
EOS ABS: 0.3 10*3/uL (ref 0.0–0.5)
EOS PCT: 4 %
HCT: 42.1 % (ref 39.0–52.0)
HEMOGLOBIN: 12.9 g/dL — AB (ref 13.0–17.0)
Immature Granulocytes: 1 %
Lymphocytes Relative: 15 %
Lymphs Abs: 1.2 10*3/uL (ref 0.7–4.0)
MCH: 27.7 pg (ref 26.0–34.0)
MCHC: 30.6 g/dL (ref 30.0–36.0)
MCV: 90.3 fL (ref 80.0–100.0)
Monocytes Absolute: 0.6 10*3/uL (ref 0.1–1.0)
Monocytes Relative: 8 %
NEUTROS PCT: 71 %
NRBC: 0 % (ref 0.0–0.2)
Neutro Abs: 5.5 10*3/uL (ref 1.7–7.7)
Platelets: 284 10*3/uL (ref 150–400)
RBC: 4.66 MIL/uL (ref 4.22–5.81)
RDW: 15 % (ref 11.5–15.5)
WBC: 7.7 10*3/uL (ref 4.0–10.5)

## 2018-05-30 LAB — COMPREHENSIVE METABOLIC PANEL
ALBUMIN: 4.3 g/dL (ref 3.5–5.0)
ALT: 20 U/L (ref 0–44)
ANION GAP: 6 (ref 5–15)
AST: 18 U/L (ref 15–41)
Alkaline Phosphatase: 44 U/L (ref 38–126)
BUN: 12 mg/dL (ref 8–23)
CHLORIDE: 107 mmol/L (ref 98–111)
CO2: 24 mmol/L (ref 22–32)
Calcium: 9.1 mg/dL (ref 8.9–10.3)
Creatinine, Ser: 0.92 mg/dL (ref 0.61–1.24)
GFR calc Af Amer: >60 mL/min (ref >60)
GFR calc non Af Amer: >60 mL/min (ref >60)
GLUCOSE: 119 mg/dL — AB (ref 70–99)
POTASSIUM: 4.1 mmol/L (ref 3.5–5.1)
SODIUM: 137 mmol/L (ref 135–145)
Total Bilirubin: 0.5 mg/dL (ref 0.3–1.2)
Total Protein: 7.1 g/dL (ref 6.5–8.1)

## 2018-05-30 LAB — TSH: TSH: 3.275 u[IU]/mL (ref 0.350–4.500)

## 2018-06-01 ENCOUNTER — Ambulatory Visit (HOSPITAL_COMMUNITY): Payer: Self-pay | Admitting: Hematology

## 2018-06-02 DIAGNOSIS — Z48815 Encounter for surgical aftercare following surgery on the digestive system: Secondary | ICD-10-CM | POA: Diagnosis not present

## 2018-06-03 ENCOUNTER — Ambulatory Visit: Payer: Self-pay | Admitting: Internal Medicine

## 2018-06-06 ENCOUNTER — Encounter (HOSPITAL_COMMUNITY): Payer: Self-pay | Admitting: Hematology

## 2018-06-06 ENCOUNTER — Other Ambulatory Visit: Payer: Self-pay

## 2018-06-06 ENCOUNTER — Inpatient Hospital Stay (HOSPITAL_BASED_OUTPATIENT_CLINIC_OR_DEPARTMENT_OTHER): Payer: Medicare Other | Admitting: Hematology

## 2018-06-06 VITALS — BP 117/79 | HR 86 | Temp 98.4°F | Resp 18 | Wt 185.0 lb

## 2018-06-06 DIAGNOSIS — Z923 Personal history of irradiation: Secondary | ICD-10-CM | POA: Diagnosis not present

## 2018-06-06 DIAGNOSIS — Z87891 Personal history of nicotine dependence: Secondary | ICD-10-CM

## 2018-06-06 DIAGNOSIS — Z7982 Long term (current) use of aspirin: Secondary | ICD-10-CM

## 2018-06-06 DIAGNOSIS — C099 Malignant neoplasm of tonsil, unspecified: Secondary | ICD-10-CM

## 2018-06-06 DIAGNOSIS — Z6826 Body mass index (BMI) 26.0-26.9, adult: Secondary | ICD-10-CM | POA: Diagnosis not present

## 2018-06-06 DIAGNOSIS — Z79899 Other long term (current) drug therapy: Secondary | ICD-10-CM | POA: Diagnosis not present

## 2018-06-06 DIAGNOSIS — C090 Malignant neoplasm of tonsillar fossa: Secondary | ICD-10-CM

## 2018-06-06 DIAGNOSIS — K7030 Alcoholic cirrhosis of liver without ascites: Secondary | ICD-10-CM | POA: Diagnosis not present

## 2018-06-06 DIAGNOSIS — I10 Essential (primary) hypertension: Secondary | ICD-10-CM

## 2018-06-06 DIAGNOSIS — Z9221 Personal history of antineoplastic chemotherapy: Secondary | ICD-10-CM

## 2018-06-06 NOTE — Patient Instructions (Signed)
[facility] Discharge Instructions     Thank you for choosing [facility] at [facility] to provide your oncology and hematology care.  To afford each patient quality time with our provider, please arrive at least 15 minutes before your scheduled appointment time.   If you have a lab appointment with the Cancer Center please come in thru the  Main Entrance and check in at the main information desk  You need to re-schedule your appointment should you arrive 10 or more minutes late.  We strive to give you quality time with our providers, and arriving late affects you and other patients whose appointments are after yours.  Also, if you no show three or more times for appointments you may be dismissed from the clinic at the providers discretion.     Again, thank you for choosing [facility].  Our hope is that these requests will decrease the amount of time that you wait before being seen by our physicians.       _____________________________________________________________  Should you have questions after your visit to [facility], please contact our office at [phone] between the hours of 8:00 a.m. and 4:30 p.m.  Voicemails left after 4:00 p.m. will not be returned until the following business day.  For prescription refill requests, have your pharmacy contact our office and allow 72 hours.    Cancer Center Support Programs:   > Cancer Support Group  2nd Tuesday of the month 1pm-2pm, Journey Room    

## 2018-06-06 NOTE — Assessment & Plan Note (Signed)
1.  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 - Denies any dysphagia. -Today's physical examination did not reveal any palpable masses or lymphadenopathy in the neck region. -We will continue to monitor at 6 monthly intervals.  2.  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 Benjamine Mola.  He was then referred to Dr. Nicolette Bang at [facility] who did a marginal mandibulectomy with primary closure on 09/20/2017.  This showed a 2.1 x 0.9 x 0.4 cm squamous cell carcinoma with mandibular bone invasion.  Wound has healed up quite nicely.  A PET CT scan on 08/24/2017 was negative for metastatic adenopathy. -He sees Dr. Nicolette Bang every 4 months.  He also sees Dr. Benjamine Mola every 6 months. -Today's examination in the oropharyngeal region did not reveal any suspicious masses.  No palpable adenopathy in the neck. -His TSH was within normal limits.  We will see him back in 6 months for follow-up.

## 2018-06-06 NOTE — Progress Notes (Signed)
[place] [address] [place], [place] [address]   CLINIC:  Medical Oncology/Hematology  PCP:  No primary care provider on file. No primary provider on file. 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 Isidore Moos.    08/21/2015 - 09/10/2015 Chemotherapy    Cisplatin every 21 days with XRT.    08/28/2015 Adverse Reaction    Tinnitis, Cisplatin-induced.    09/11/2015 - 09/30/2015 Chemotherapy    Carboplatin/5FU    09/20/2015 - 09/21/2015 Hospital Admission    Intractable nausea/vomiting. Severe Mucositis    10/17/2015 - 10/20/2015 Hospital Admission    Esophageal/oral candidasis. Shingles. Fever. Chemotherapy induced pancytopenia    01/24/2016 PET scan    Resolution of the left tonsillar mass and left neck adenopathy. No findings for residual or locally recurrent tumor, adenopathy or distant metastatic disease.    03/28/2016 - 04/08/2016 Hospital Admission    Admitted with severe abdominal pain. Found to diffuse peritonitis from perforated sigmoid colon requiring colostomy. Discharged to [facility] in [place].          CANCER STAGING: Cancer Staging Tonsil cancer ([place]) Staging form: Pharynx - Oropharynx, AJCC 7th Edition - Clinical: Stage IVA (T2, N2b, M0) - Signed by Eppie Gibson, MD on 08/02/2015    INTERVAL HISTORY:  Mr. Kent 68 y.o. male returns for routine follow-up for squamous cell carcinoma of left tonsil. He is doing well with no complaints at this time. He denies any trouble swallowing or pain with swallowing. Denies any nausea, vomiting, or diarrhea. Denies any new pains. Had not noticed any  recent bleeding such as epistaxis, hematuria or hematochezia. Denies recent chest pain on exertion, shortness of breath on minimal exertion, pre-syncopal episodes, or palpitations. Denies any numbness or tingling in hands or feet. Denies any recent fevers, infections, or recent hospitalizations. He reports his appetite and energy level at 75%.    REVIEW OF SYSTEMS:  Review of Systems  All other systems reviewed and are negative.    PAST MEDICAL/SURGICAL HISTORY:  Past Medical History:  Diagnosis Date  . Cancer ([place])   . Cirrhosis ([place])    Followed at [facility]  . GERD (gastroesophageal reflux disease)   . History of bowel resection   . History of tonsillectomy    Dr. Benjamine Mola, 06/2015  . Hypertension   . Iron deficiency anemia 05/07/2016  . Melanoma ([place])    followed by Dr. Cleophas Dunker  . Portal hypertension ([place])   . Substance abuse ([place])    alcohol  . Tonsillar cancer ([place]) 06/2015   SCC  . Tonsillar mass    left   Past Surgical History:  Procedure Laterality Date  . BIOPSY  07/02/2016   Procedure: BIOPSY;  Surgeon: Daneil Dolin, MD;  [facility];  Service: Endoscopy;;  esophageal  . COLON SURGERY    . COLONOSCOPY WITH PROPOFOL N/A 07/02/2016   Procedure: COLONOSCOPY WITH PROPOFOL;  Surgeon: Daneil Dolin, MD;  [facility];  Service: Endoscopy;  Laterality: N/A;  9:45am  . COLOSTOMY TAKEDOWN N/A 09/02/2016  Procedure: LAPAROSCOPIC COLOSTOMY REVERSAL;  Surgeon: Leighton Ruff, MD;  [facility];  Service: General;  Laterality: N/A;  . ESOPHAGOGASTRODUODENOSCOPY (EGD) WITH PROPOFOL N/A 07/02/2016   Procedure: ESOPHAGOGASTRODUODENOSCOPY (EGD) WITH PROPOFOL;  Surgeon: Daneil Dolin, MD;  [facility];  Service: Endoscopy;  Laterality: N/A;  . HERNIA REPAIR Right   . INCISIONAL HERNIA REPAIR  09/02/2016   Procedure: HERNIA REPAIR INCISIONAL;  Surgeon: Leighton Ruff, MD;  [facility];  Service: General;;  . LAPAROTOMY N/A 03/28/2016   Procedure:  EXPLORATORY LAPAROTOMY, SIGMOID COLECTOMY, COLOSTOMY;  Surgeon: Leighton Ruff, MD;  [facility];  Service: General;  Laterality: N/A;  [name] DILATION N/A 07/02/2016   Procedure: Keturah Shavers;  Surgeon: Daneil Dolin, MD;  [facility];  Service: Endoscopy;  Laterality: N/A;  . MOUTH SURGERY    . PORT-A-CATH REMOVAL  09/02/2016   Procedure: REMOVAL PORT-A-CATH;  Surgeon: Leighton Ruff, MD;  [facility];  Service: General;;  . REMOVAL OF GASTROSTOMY TUBE N/A 09/02/2016   Procedure: REMOVAL OF GASTROSTOMY TUBE;  Surgeon: Leighton Ruff, MD;  [facility];  Service: General;  Laterality: N/A;  . TONSILLECTOMY Left 07/09/2015   Procedure: LEFT TONSILLECTOMY;  Surgeon: Leta Baptist, MD;  Location: [place];  Service: ENT;  Laterality: Left;  . TONSILLECTOMY    . Z-PLASTY SCAR REVISION N/A 09/02/2016   Procedure: SCAR REVISION;  Surgeon: Leighton Ruff, MD;  [facility];  Service: General;  Laterality: N/A;     SOCIAL HISTORY:  Social History   Socioeconomic History  . Marital status: Single    Spouse name: Not on file  . Number of children: 0  . Years of education: Not on file  . Highest education level: Not on file  Occupational History  . Not on file  Social Needs  . Financial resource strain: Not on file  . Food insecurity:    Worry: Not on file    Inability: Not on file  . Transportation needs:    Medical: Not on file    Non-medical: Not on file  Tobacco Use  . Smoking status: Former Smoker    Packs/day: 0.50    Years: 30.00    Pack years: 15.00    Types: Cigarettes    Last attempt to quit: 11/22/2016    Years since quitting: 1.5  . Smokeless tobacco: Never Used  Substance and Sexual Activity  . Alcohol use: No    Alcohol/week: 0.0 standard drinks    Comment: former abuse- quit 09/2011  . Drug use: No  . Sexual activity: Not on file  Lifestyle  . Physical activity:    Days per week: Not on file    Minutes per session: Not on  file  . Stress: Not on file  Relationships  . Social connections:    Talks on phone: Not on file    Gets together: Not on file    Attends religious service: Not on file    Active member of club or organization: Not on file    Attends meetings of clubs or organizations: Not on file    Relationship status: Not on file  . Intimate partner violence:    Fear of current or ex partner: Not on file    Emotionally abused: Not on file    Physically abused: Not on file    Forced sexual activity: Not on file  Other Topics Concern  . Not on file  Social History Narrative  Divorced.   No children.   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    Has been smoking off/on since age 79, quit smoking 11/2016.    Exercises minimally   Eats all food groups.   Wears seatbelt.   Mother lives with him, has dementia.   Attends AA meetings.     FAMILY HISTORY:  Family History  Problem Relation Age of Onset  . Macular degeneration Mother   . Dementia Mother   . Alcoholism Father   . Diabetes Maternal Aunt   . Diabetes Maternal Uncle   . Mental illness Sister     CURRENT MEDICATIONS:  Outpatient Encounter Medications as of 06/06/2018  Medication Sig  . aspirin EC 81 MG tablet Take 1 tablet (81 mg total) by mouth daily.  Marland Kitchen atorvastatin (LIPITOR) 40 MG tablet Take 1 tablet (40 mg total) by mouth daily.  . cetirizine (ZYRTEC) 10 MG tablet Take 10 mg by mouth at bedtime.   Marland Kitchen DEXILANT 60 MG capsule Take 1 capsule by mouth daily.  . DULoxetine (CYMBALTA) 30 MG capsule Take by mouth.  Marland Kitchen lisinopril (PRINIVIL,ZESTRIL) 10 MG tablet Take 1 tablet (10 mg total) by mouth daily.  . Melatonin 10 MG TABS Take 1 tablet by mouth at bedtime.  . MULTIPLE VITAMINS-MINERALS ER PO Take by mouth.  . sodium fluoride (FLUORISHIELD) 1.1 % GEL dental gel Instill one drop of gel per tooth space of fluoride tray. Place over teeth for 5  minutes. Remove. Spit out excess. Repeat nightly.  . triamcinolone cream (KENALOG) 0.1 % Apply 1 application 2 (two) times daily topically.  . vitamin B-12 (CYANOCOBALAMIN) 1000 MCG tablet Take 1,000 mcg by mouth daily.  . [DISCONTINUED] escitalopram (LEXAPRO) 20 MG tablet Take 1 tablet (20 mg total) by mouth daily.  . [DISCONTINUED] varenicline (CHANTIX CONTINUING MONTH PAK) 1 MG tablet Take 1 tablet (1 mg total) by mouth 2 (two) times daily.  . diphenhydrAMINE (BENADRYL) 50 MG tablet Take 50 mg by mouth at bedtime as needed for sleep.   Facility-Administered Encounter Medications as of 06/06/2018  Medication  . 0.9 %  sodium chloride infusion  . 0.9 %  sodium chloride infusion  . 0.9 %  sodium chloride infusion  . 0.9 %  sodium chloride infusion  . 0.9 %  sodium chloride infusion    ALLERGIES:  Allergies  Allergen Reactions  . Bee Venom Swelling  . Other     Opthalmic mycin irritates eyes.      PHYSICAL EXAM:  ECOG Performance status: 1  Vitals:   06/06/18 1100  BP: 117/79  Pulse: 86  Resp: 18  Temp: 98.4 F (36.9 C)  SpO2: 99%   Filed Weights   06/06/18 1100  Weight: 185 lb (83.9 kg)    Physical Exam Constitutional:      Appearance: Normal appearance. He is normal weight.  Abdominal:     General: Abdomen is flat.     Palpations: Abdomen is soft.  Musculoskeletal: Normal range of motion.  Skin:    General: Skin is warm and dry.  Neurological:     Mental Status: He is alert and oriented to person, place, and time. Mental status is at baseline.  Psychiatric:        Mood and Affect: Mood normal.        Behavior: Behavior normal.        Thought Content: Thought  content normal.        Judgment: Judgment normal.      LABORATORY DATA:  I have reviewed the labs as listed.  CBC    Component Value Date/Time   WBC 7.7 05/30/2018 1437   RBC 4.66 05/30/2018 1437   HGB 12.9 (L) 05/30/2018 1437   HGB 11.5 (L) 10/21/2011 1614   HCT 42.1 05/30/2018 1437   HCT  34.6 (L) 10/21/2011 1614   PLT 284 05/30/2018 1437   PLT 209 10/21/2011 1614   MCV 90.3 05/30/2018 1437   MCV 101 (H) 10/21/2011 1614   MCH 27.7 05/30/2018 1437   MCHC 30.6 05/30/2018 1437   RDW 15.0 05/30/2018 1437   RDW 14.0 10/21/2011 1614   LYMPHSABS 1.2 05/30/2018 1437   LYMPHSABS 1.9 10/20/2011 0609   MONOABS 0.6 05/30/2018 1437   MONOABS 1.1 (H) 10/20/2011 0609   EOSABS 0.3 05/30/2018 1437   EOSABS 0.1 10/20/2011 0609   BASOSABS 0.1 05/30/2018 1437   BASOSABS 0.1 10/20/2011 0609   CMP Latest Ref Rng & Units 05/30/2018 01/20/2018 11/19/2017  Glucose 70 - 99 mg/dL 119(H) - 103  BUN 8 - 23 mg/dL 12 - 14  Creatinine 0.61 - 1.24 mg/dL 0.92 - 0.68(L)  Sodium 135 - 145 mmol/L 137 - 134(L)  Potassium 3.5 - 5.1 mmol/L 4.1 - 4.6  Chloride 98 - 111 mmol/L 107 - 99  CO2 22 - 32 mmol/L 24 - 28  Calcium 8.9 - 10.3 mg/dL 9.1 - 9.4  Total Protein 6.5 - 8.1 g/dL 7.1 6.8 6.6  Total Bilirubin 0.3 - 1.2 mg/dL 0.5 0.4 0.3  Alkaline Phos 38 - 126 U/L 44 - -  AST 15 - 41 U/L 18 12 11   ALT 0 - 44 U/L 20 11 10        DIAGNOSTIC IMAGING:  I have independently reviewed the scans and discussed with the patient.   I have reviewed [name], NP's note and agree with the documentation.  I personally performed a face-to-face visit, made revisions and my assessment and plan is as follows.    ASSESSMENT & PLAN:   Tonsil cancer ([place]) 1.  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 - Denies any dysphagia. -Today's physical examination did not reveal any palpable masses or lymphadenopathy in the neck region. -We will continue to monitor at 6 monthly intervals.  2.  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 Benjamine Mola.  He was then referred to Dr. Nicolette Bang at [facility] who did a marginal mandibulectomy with primary closure on 09/20/2017.  This showed a 2.1 x 0.9 x 0.4 cm squamous cell carcinoma with mandibular bone invasion.  Wound has healed up quite nicely.  A PET CT scan on 08/24/2017 was negative for metastatic adenopathy. -He sees Dr. Nicolette Bang every 4 months.  He also sees Dr. Benjamine Mola every 6 months. -Today's examination in the oropharyngeal region did not reveal any suspicious masses.  No palpable adenopathy in the neck. -His TSH was within normal limits.  We will see him back in 6 months for follow-up.      Orders placed this encounter:  Orders Placed This Encounter  Procedures  . TSH  . CBC with Differential/Platelet  .  Comprehensive metabolic panel      Derek Jack, MD [place] [phone]

## 2018-06-17 ENCOUNTER — Ambulatory Visit (INDEPENDENT_AMBULATORY_CARE_PROVIDER_SITE_OTHER): Payer: Medicare Other | Admitting: Internal Medicine

## 2018-06-17 ENCOUNTER — Encounter: Payer: Self-pay | Admitting: Internal Medicine

## 2018-06-17 VITALS — BP 125/78 | HR 109 | Temp 98.2°F | Ht 70.0 in | Wt 185.8 lb

## 2018-06-17 DIAGNOSIS — K2100 Gastro-esophageal reflux disease with esophagitis, without bleeding: Secondary | ICD-10-CM

## 2018-06-17 DIAGNOSIS — K7030 Alcoholic cirrhosis of liver without ascites: Secondary | ICD-10-CM

## 2018-06-17 NOTE — Progress Notes (Signed)
Primary Care Physician:  Caren Macadam, MD Primary Gastroenterologist:  Dr. Gala Romney  Pre-Procedure History & Physical: HPI:  Frank Castillo is a 68 y.o. male here for follow-up of EtOH related cirrhosis.  History of tonsillar and head neck carcinoma.  Had a jaw recurrence requiring a partial resection since I last saw him.  Felt to be cancer free.  He is no longer smoking; he has not consumed alcohol in 7 years.  Sees the [facility] liver clinic as well for cirrhosis management.  Last seen there earlier in the month.   EGD demonstrated both reflux and radiation-induced esophagitis.  Dysphagia (cervical web)  Treated with a Maloney dilator.  No longer having dysphagia.  Reflux well controlled on Dexilant.  Patient wonders if he can go on a less expensive PPI.  He just saw [facility] folks earlier in the month.  I do not have results of the labs.  He is felt to be well compensated -  plans are to see him back in 1 year.  Do not have labs for MELD score at this time.  He is getting periodic ultrasounds through [facility]  No varices in 2018.  It is unknown to me when [facility] recommends a follow-up screening EGD.  Also, I cannot find whether where he has been assessed for hepatitis a and B immune status.  Colonoscopy in 2018 via colostomy and Hartman's pouch examination demonstrated no polyps or neoplasia.  Colostomy was taken down and he has been doing well without any lower GI tract complaints; he is due for a screening colonoscopy in 8 years.  Past Medical History:  Diagnosis Date  . Cancer ([place])   . Cirrhosis ([place])    Followed at [facility]  . GERD (gastroesophageal reflux disease)   . History of bowel resection   . History of tonsillectomy    Dr. Benjamine Mola, 06/2015  . Hypertension   . Iron deficiency anemia 05/07/2016  . Melanoma ([place])    followed by Dr. Cleophas Dunker  . Portal hypertension ([place])   . Substance abuse ([place])    alcohol  . Tonsillar cancer ([place]) 06/2015   SCC  . Tonsillar mass    left    Past Surgical  History:  Procedure Laterality Date  . BIOPSY  07/02/2016   Procedure: BIOPSY;  Surgeon: Daneil Dolin, MD;  [facility];  Service: Endoscopy;;  esophageal  . COLON SURGERY    . COLONOSCOPY WITH PROPOFOL N/A 07/02/2016   Procedure: COLONOSCOPY WITH PROPOFOL;  Surgeon: Daneil Dolin, MD;  [facility];  Service: Endoscopy;  Laterality: N/A;  9:45am  . COLOSTOMY TAKEDOWN N/A 09/02/2016   Procedure: LAPAROSCOPIC COLOSTOMY REVERSAL;  Surgeon: Leighton Ruff, MD;  [facility];  Service: General;  Laterality: N/A;  . ESOPHAGOGASTRODUODENOSCOPY (EGD) WITH PROPOFOL N/A 07/02/2016   Procedure: ESOPHAGOGASTRODUODENOSCOPY (EGD) WITH PROPOFOL;  Surgeon: Daneil Dolin, MD;  [facility];  Service: Endoscopy;  Laterality: N/A;  . HERNIA REPAIR Right   . INCISIONAL HERNIA REPAIR  09/02/2016   Procedure: HERNIA REPAIR INCISIONAL;  Surgeon: Leighton Ruff, MD;  [facility];  Service: General;;  . LAPAROTOMY N/A 03/28/2016   Procedure: EXPLORATORY LAPAROTOMY, SIGMOID COLECTOMY, COLOSTOMY;  Surgeon: Leighton Ruff, MD;  [facility];  Service: General;  Laterality: N/A;  [name] DILATION N/A 07/02/2016   Procedure: Keturah Shavers;  Surgeon: Daneil Dolin, MD;  [facility];  Service: Endoscopy;  Laterality: N/A;  . MOUTH SURGERY    .  PORT-A-CATH REMOVAL  09/02/2016   Procedure: REMOVAL PORT-A-CATH;  Surgeon: Leighton Ruff, MD;  [facility];  Service: General;;  . REMOVAL OF GASTROSTOMY TUBE N/A 09/02/2016   Procedure: REMOVAL OF GASTROSTOMY TUBE;  Surgeon: Leighton Ruff, MD;  [facility];  Service: General;  Laterality: N/A;  . TONSILLECTOMY Left 07/09/2015   Procedure: LEFT TONSILLECTOMY;  Surgeon: Leta Baptist, MD;  Location: [place];  Service: ENT;  Laterality: Left;  . TONSILLECTOMY    . Z-PLASTY SCAR REVISION N/A 09/02/2016   Procedure: SCAR REVISION;  Surgeon: Leighton Ruff, MD;  [facility];  Service: General;  Laterality:  N/A;    Prior to Admission medications   Medication Sig Start Date End Date Taking? Authorizing Provider  aspirin EC 81 MG tablet Take 1 tablet (81 mg total) by mouth daily. 06/17/17  Yes Caren Macadam, MD  atorvastatin (LIPITOR) 40 MG tablet Take 1 tablet (40 mg total) by mouth daily. 11/29/17  Yes Hagler, [name], MD  cetirizine (ZYRTEC) 10 MG tablet Take 10 mg by mouth as needed.    Yes [provider]  DEXILANT 60 MG capsule Take 1 capsule by mouth daily. 05/19/17  Yes [provider]  diphenhydrAMINE (BENADRYL) 50 MG tablet Take 50 mg by mouth at bedtime as needed for sleep.   Yes [provider]  DULoxetine (CYMBALTA) 30 MG capsule Take 60 mg by mouth daily.    Yes [provider]  lisinopril (PRINIVIL,ZESTRIL) 10 MG tablet Take 1 tablet (10 mg total) by mouth daily. 05/20/17  Yes Hagler, [name], MD  Melatonin 10 MG TABS Take 1 tablet by mouth at bedtime.   Yes [provider]  MULTIPLE VITAMINS-MINERALS ER PO Take by mouth.   Yes [provider]  sodium fluoride (FLUORISHIELD) 1.1 % GEL dental gel Instill one drop of gel per tooth space of fluoride tray. Place over teeth for 5 minutes. Remove. Spit out excess. Repeat nightly. 03/16/17  Yes Hagler, [name], MD  triamcinolone cream (KENALOG) 0.1 % Apply 1 application 2 (two) times daily topically. 03/25/17  Yes [provider]  vitamin B-12 (CYANOCOBALAMIN) 1000 MCG tablet Take 1,000 mcg by mouth daily.    [provider]    Allergies as of 06/17/2018 - Review Complete 06/17/2018  Allergen Reaction Noted  . Bee venom Swelling 07/31/2015  . Other  11/28/2015    Family History  Problem Relation Age of Onset  . Macular degeneration Mother   . Dementia Mother   . Alcoholism Father   . Diabetes Maternal Aunt   . Diabetes Maternal Uncle   . Mental illness Sister     Social History   Socioeconomic History  . Marital status: Single    Spouse name: Not on file  .  Number of children: 0  . Years of education: Not on file  . Highest education level: Not on file  Occupational History  . Not on file  Social Needs  . Financial resource strain: Not on file  . Food insecurity:    Worry: Not on file    Inability: Not on file  . Transportation needs:    Medical: Not on file    Non-medical: Not on file  Tobacco Use  . Smoking status: Former Smoker    Packs/day: 0.50    Years: 30.00    Pack years: 15.00    Types: Cigarettes    Last attempt to quit: 11/22/2016    Years since quitting: 1.5  . Smokeless tobacco:  Never Used  Substance and Sexual Activity  . Alcohol use: No    Alcohol/week: 0.0 standard drinks    Comment: former abuse- quit 09/2011  . Drug use: No  . Sexual activity: Not on file  Lifestyle  . Physical activity:    Days per week: Not on file    Minutes per session: Not on file  . Stress: Not on file  Relationships  . Social connections:    Talks on phone: Not on file    Gets together: Not on file    Attends religious service: Not on file    Active member of club or organization: Not on file    Attends meetings of clubs or organizations: Not on file    Relationship status: Not on file  . Intimate partner violence:    Fear of current or ex partner: Not on file    Emotionally abused: Not on file    Physically abused: Not on file    Forced sexual activity: Not on file  Other Topics Concern  . Not on file  Social History Narrative   Divorced.   No children.   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    Has been smoking off/on since age 65, quit smoking 11/2016.    Exercises minimally   Eats all food groups.   Wears seatbelt.   Mother lives with him, has dementia.   Attends AA meetings.     Review of Systems: See HPI, otherwise negative ROS  Physical Exam: BP 125/78   Pulse (!) 109   Temp 98.2 F (36.8 C) (Oral)   Ht 5\' 10"   (1.778 m)   Wt 185 lb 12.8 oz (84.3 kg)   BMI 26.66 kg/m  General:   Alert,  Well-developed, well-nourished, pleasant and cooperative in NAD Skin: No cutaneous stigmata of chronic liver disease Eyes:  Sclera clear, no icterus.   Conjunctiva pink. Lungs:  Clear throughout to auscultation.   No wheezes, crackles, or rhonchi. No acute distress. Heart:  Regular rate and rhythm; no murmurs, clicks, rubs,  or gallops. Abdomen: Non-distended, normal bowel sounds.  Soft and nontender without appreciable mass or hepatosplenomegaly.  Pulses:  Normal pulses noted. Extremities:  Without clubbing or edema.  Impression/Plan: 68 year old gentleman EtOH cirrhosis-clinically, well compensated. He is followed by [facility] liver clinic as well as here.  I would wonder about the need for screening EGD at this time.  Will defer to [facility] on that.  He is due for a screening colonoscopy in 8 years.  Also, hepatitis A and B immune status unknown.  That needs to be looked into further  GERD well controlled on PPI.  He may have a better benefit for either omeprazole or pantoprazole.  We will check with his insurance company.  Recommendations:  We will check on  Hepatitis A and B status  Plan for a follow-up appt here 07/2019  We will check on a less expensive PPI for you and let you know  Screening colonoscopy in 8 years  Check with [facility] at your next visit regarding timing of next upper endoscopy.    Notice: This dictation was prepared with Dragon dictation along with smaller phrase technology. Any transcriptional errors that result from this process are unintentional and may not be corrected upon review.

## 2018-06-17 NOTE — Patient Instructions (Signed)
We will check on your Hepatitis A and B status  Plan for a follow-up appt here 07/2019  We will check on a less expensive PPI for you and let you know  Screening colonoscopy in 8 years  Check with [facility] at your next visit regarding timing of next upper endoscopy.

## 2018-06-20 ENCOUNTER — Telehealth: Payer: Self-pay

## 2018-06-20 NOTE — Telephone Encounter (Signed)
[facility], checking on the status of pts Hep A & B per RMR at pts ov 06/17/18. Waiting to hear back from pts nurse.

## 2018-06-20 NOTE — Telephone Encounter (Signed)
Dr. Lynnette Caffey nurse called back. She isn't sure where the labs are and will consult with the doctor. She will call back.

## 2018-06-28 ENCOUNTER — Telehealth: Payer: Self-pay | Admitting: Internal Medicine

## 2018-06-28 NOTE — Telephone Encounter (Signed)
Spoke with pt/ his RX card wasn't scanned into our system. Pt has an [place], [place], [id number]. Will work on [place].

## 2018-06-28 NOTE — Telephone Encounter (Signed)
Patient calling back from yesterday about the new prescription he needs

## 2018-06-28 NOTE — Telephone Encounter (Signed)
Will call Jocelyn Lamer to see if they found labs for pt.

## 2018-06-29 DIAGNOSIS — L821 Other seborrheic keratosis: Secondary | ICD-10-CM | POA: Diagnosis not present

## 2018-06-29 DIAGNOSIS — Z85820 Personal history of malignant melanoma of skin: Secondary | ICD-10-CM | POA: Diagnosis not present

## 2018-06-29 NOTE — Telephone Encounter (Signed)
Spoke with Janett Billow (rep from [employer]), Protonix/Pantoprazole 40mg  is a tier 2 and is only $7, Nexium is a non formulary along with Esomeprazole, Aciphex isn't listed on pts list, Dexilant is over $300. Please advise on which medication you would like to prescribe.

## 2018-06-30 NOTE — Telephone Encounter (Signed)
May have pantoprazole 40 mg daily.  Dispense either 30 or 90 with a years refill

## 2018-07-01 ENCOUNTER — Other Ambulatory Visit: Payer: Self-pay

## 2018-07-01 MED ORDER — PANTOPRAZOLE SODIUM 40 MG PO TBEC: 40.0000 mg | DELAYED_RELEASE_TABLET | Freq: Every day | ORAL | 5 refills | Status: DC

## 2018-07-01 NOTE — Telephone Encounter (Signed)
Noted. Medication was sent into pts pharmacy. Pt notified that medication was sent into pharmacy.

## 2018-07-04 DIAGNOSIS — F321 Major depressive disorder, single episode, moderate: Secondary | ICD-10-CM | POA: Diagnosis not present

## 2018-07-04 DIAGNOSIS — E781 Pure hyperglyceridemia: Secondary | ICD-10-CM | POA: Diagnosis not present

## 2018-07-04 DIAGNOSIS — I10 Essential (primary) hypertension: Secondary | ICD-10-CM | POA: Diagnosis not present

## 2018-07-12 ENCOUNTER — Telehealth: Payer: Self-pay

## 2018-07-12 IMAGING — DX DG ABDOMEN ACUTE W/ 1V CHEST
4 series · 4 of 4 positions shown · non-contrast
Comparison: PET-CT 07/30/2015

CLINICAL DATA: Fever, tobacco use and hypertension. History of
cirrhosis.

EXAM:
DG ABDOMEN ACUTE W/ 1V CHEST

[chest pa]
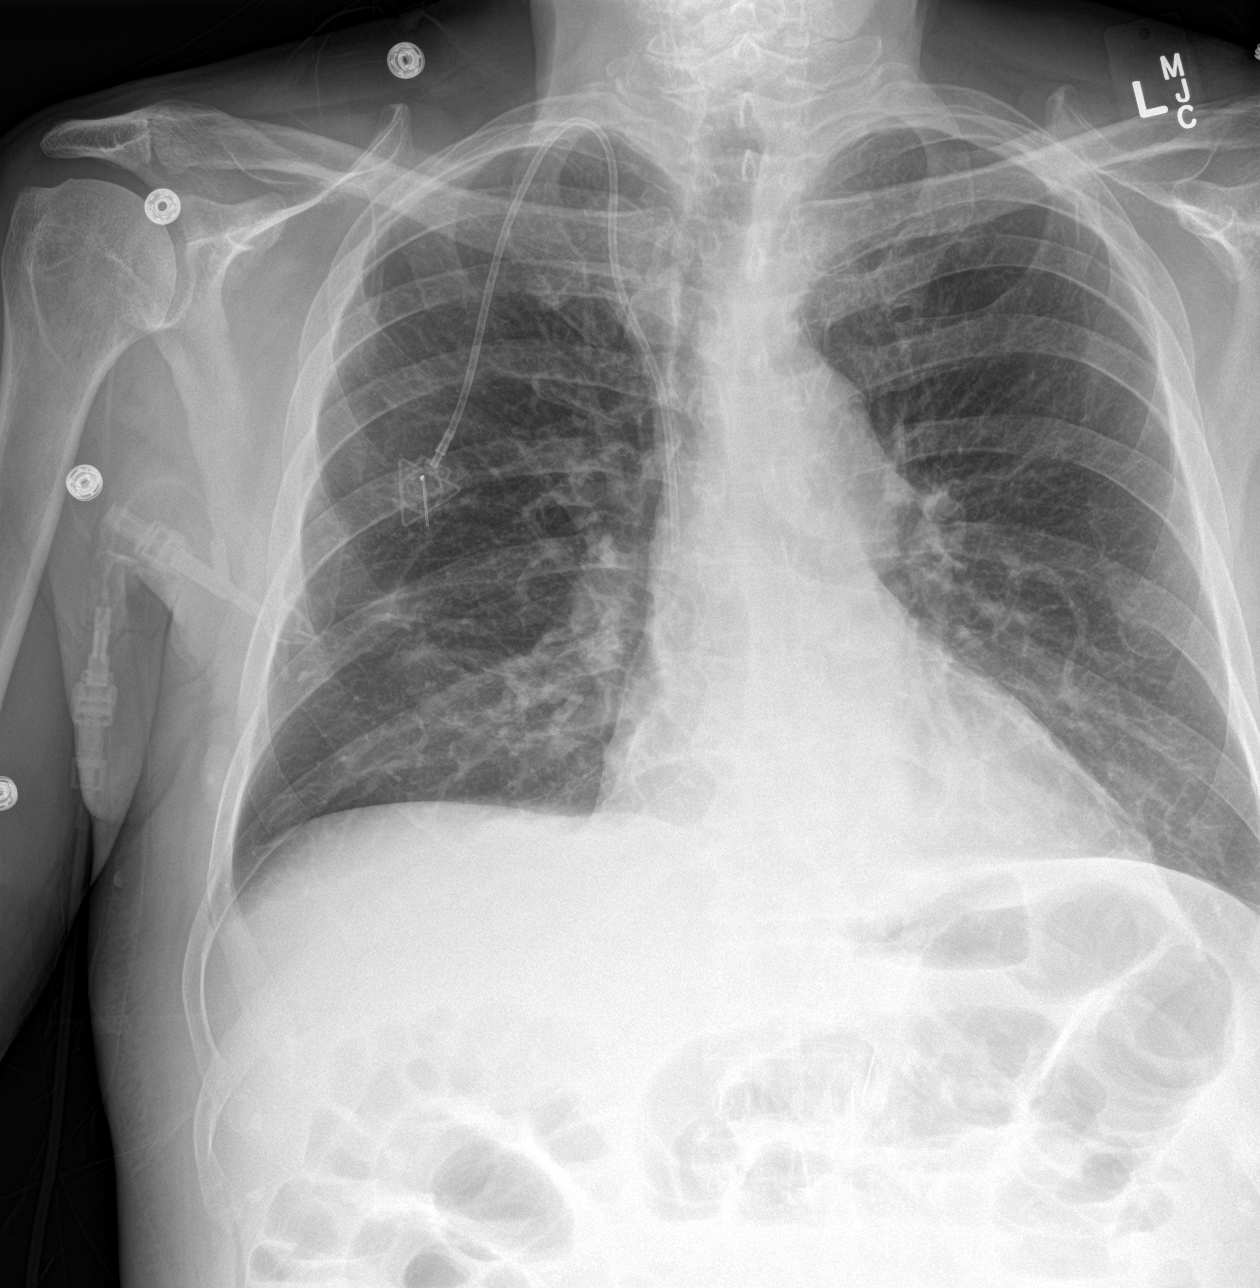

[abdomen erect]
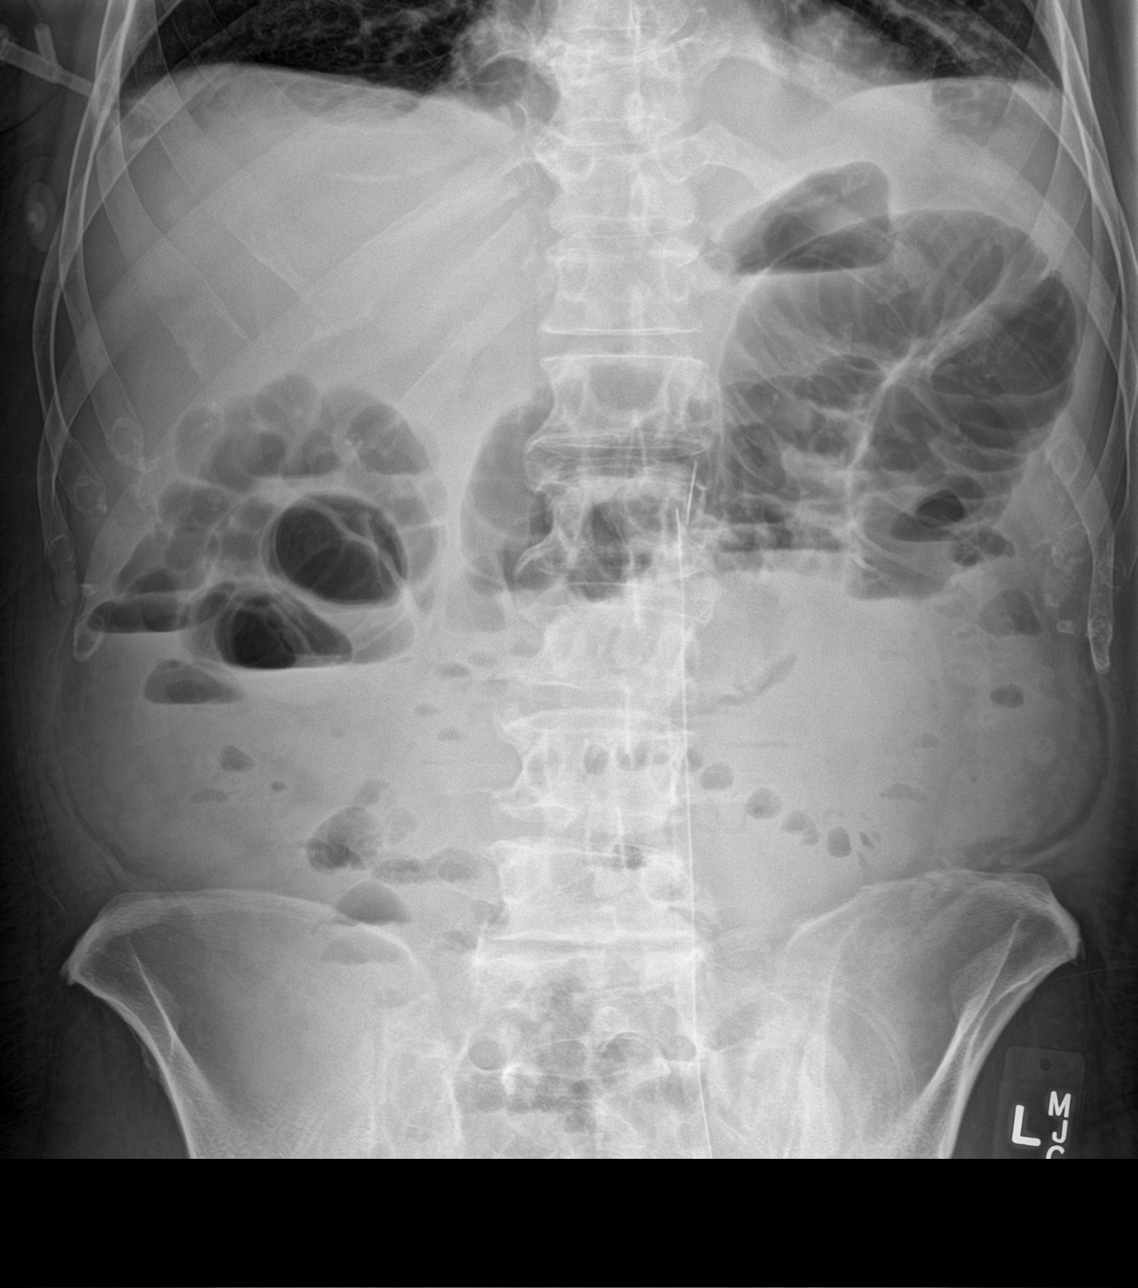

[abdomen supine (1 of 2)]
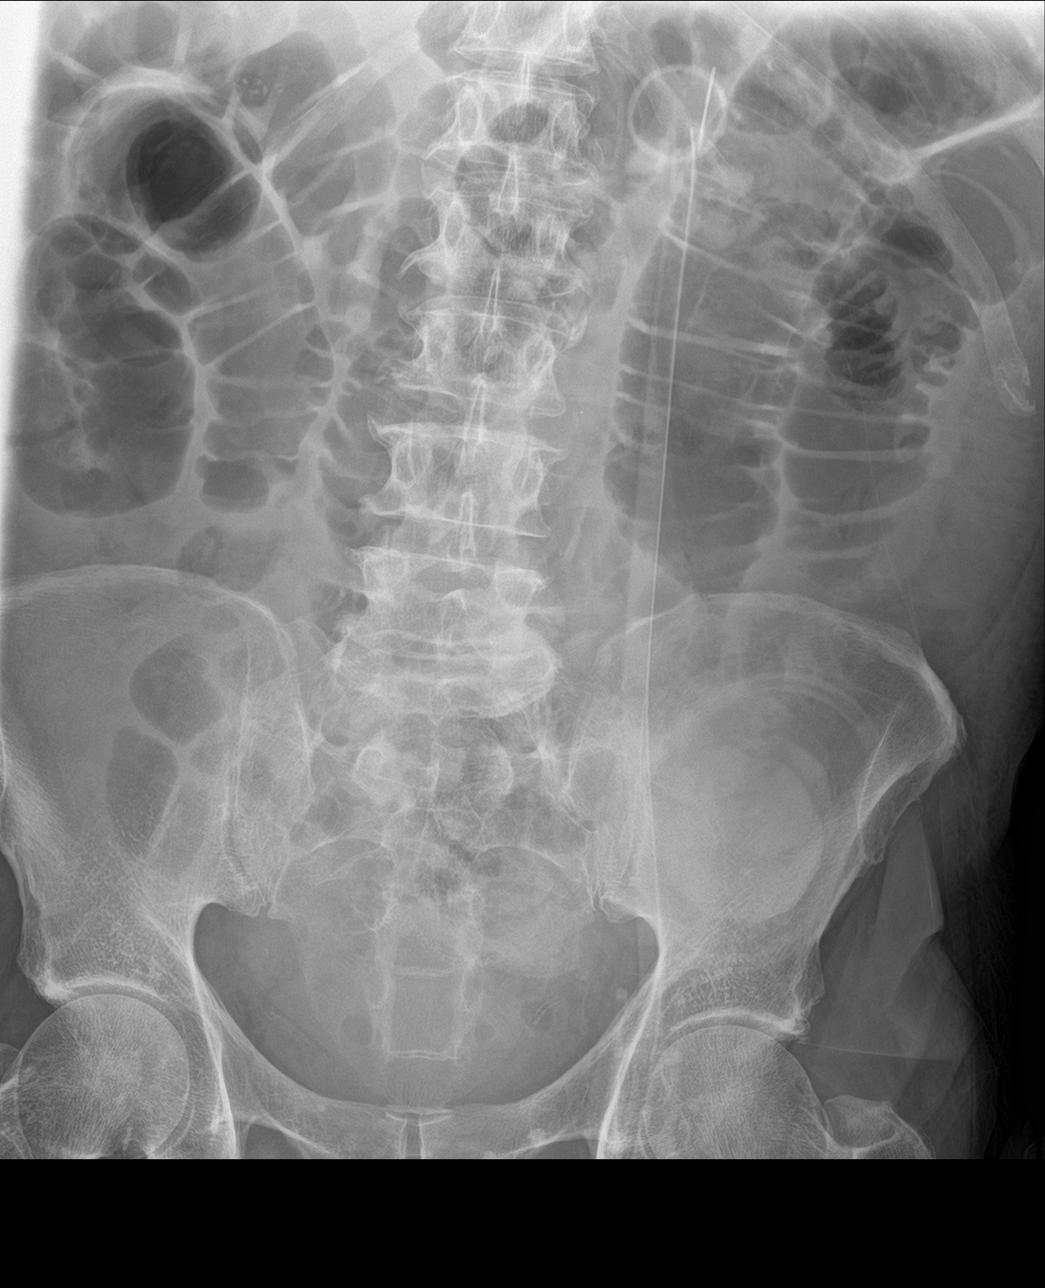

[abdomen supine (2 of 2)]
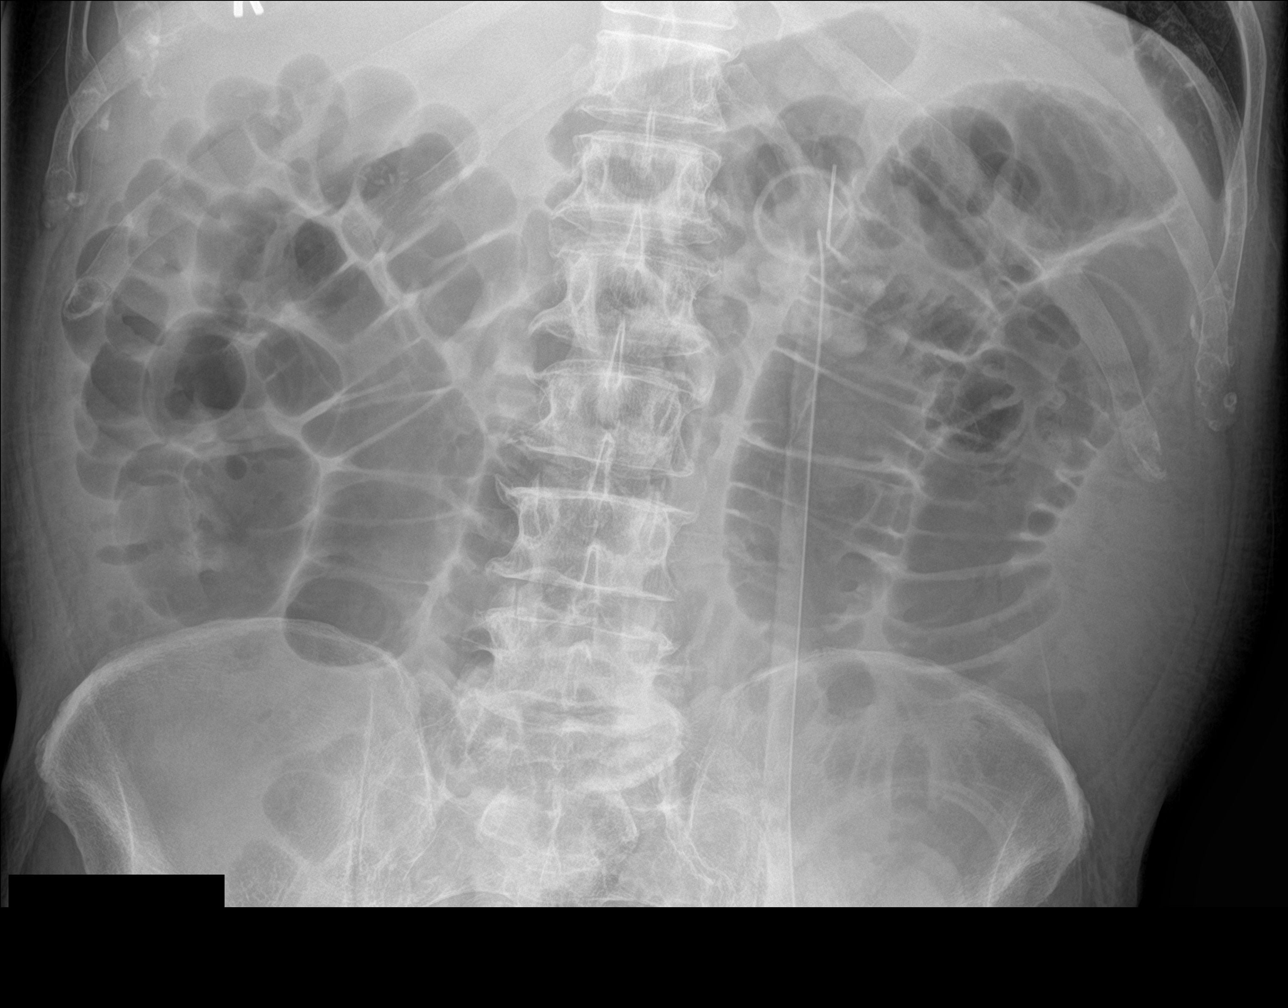

[4 of 4 positions shown; findings below may reference images not displayed]

FINDINGS: The heart is top-normal in size. There is aortic atherosclerosis.
Lungs mildly hyperinflated with emphysematous changes bilaterally.
Port catheter tip is seen in the distal SVC.

Dilated small bowel loops out of proportion to large bowel are seen
in a pattern consistent with high-grade small bowel obstruction.
Gastrostomy tube projects over the left quadrant. Left lower
quadrant colostomy is also seen. There is no free air.
IMPRESSION: Abnormal bowel gas pattern consistent with high-grade small bowel
obstruction.

Port catheter tip in the distal SVC. Gastrostomy tube tip in the
left upper quadrant. Left lower quadrant colostomy.

Aortic atherosclerosis.

Mild hyperinflation  lungs without acute pulmonary consolidation.

## 2018-07-12 NOTE — Telephone Encounter (Signed)
RMR, Pt was seen in our office on 06/17/18 and we were asked to check with [facility] about pts Hep A & B status. I spoke with [facility] a several times about the Hep A & B status.  Dr. Chriss Czar nurse confirmed with me today on 07/12/18, pt has had several vaccinations but they don't have a record of hepatitis A or B.

## 2018-07-13 DIAGNOSIS — Z85820 Personal history of malignant melanoma of skin: Secondary | ICD-10-CM | POA: Diagnosis not present

## 2018-07-13 DIAGNOSIS — C049 Malignant neoplasm of floor of mouth, unspecified: Secondary | ICD-10-CM | POA: Diagnosis not present

## 2018-07-13 DIAGNOSIS — Z87891 Personal history of nicotine dependence: Secondary | ICD-10-CM | POA: Diagnosis not present

## 2018-07-13 DIAGNOSIS — Z8719 Personal history of other diseases of the digestive system: Secondary | ICD-10-CM | POA: Diagnosis not present

## 2018-07-13 DIAGNOSIS — Z6826 Body mass index (BMI) 26.0-26.9, adult: Secondary | ICD-10-CM | POA: Diagnosis not present

## 2018-07-13 DIAGNOSIS — Z923 Personal history of irradiation: Secondary | ICD-10-CM | POA: Diagnosis not present

## 2018-07-13 DIAGNOSIS — Z9221 Personal history of antineoplastic chemotherapy: Secondary | ICD-10-CM | POA: Diagnosis not present

## 2018-07-15 IMAGING — DX DG ABDOMEN 2V
3 series · 3 of 3 positions shown · non-contrast
Comparison: 04/04/2016

CLINICAL DATA: Fever, obstipation

EXAM:
ABDOMEN - 2 VIEW

[abdomen erect]
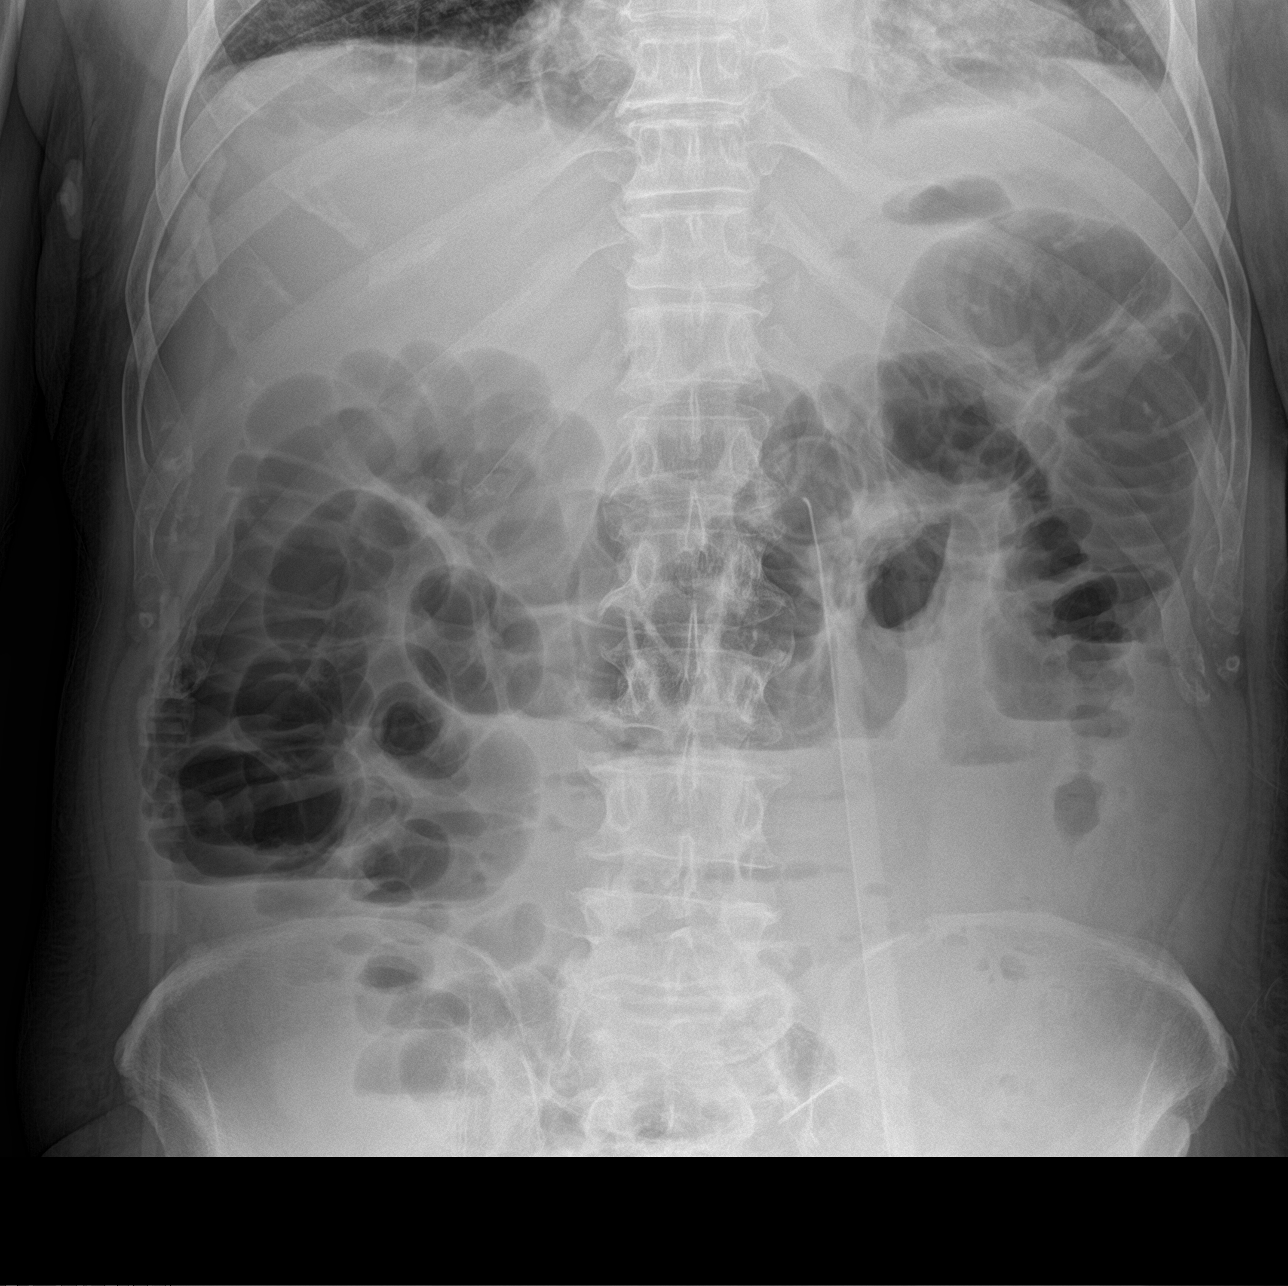

[abdomen supine (1 of 2)]
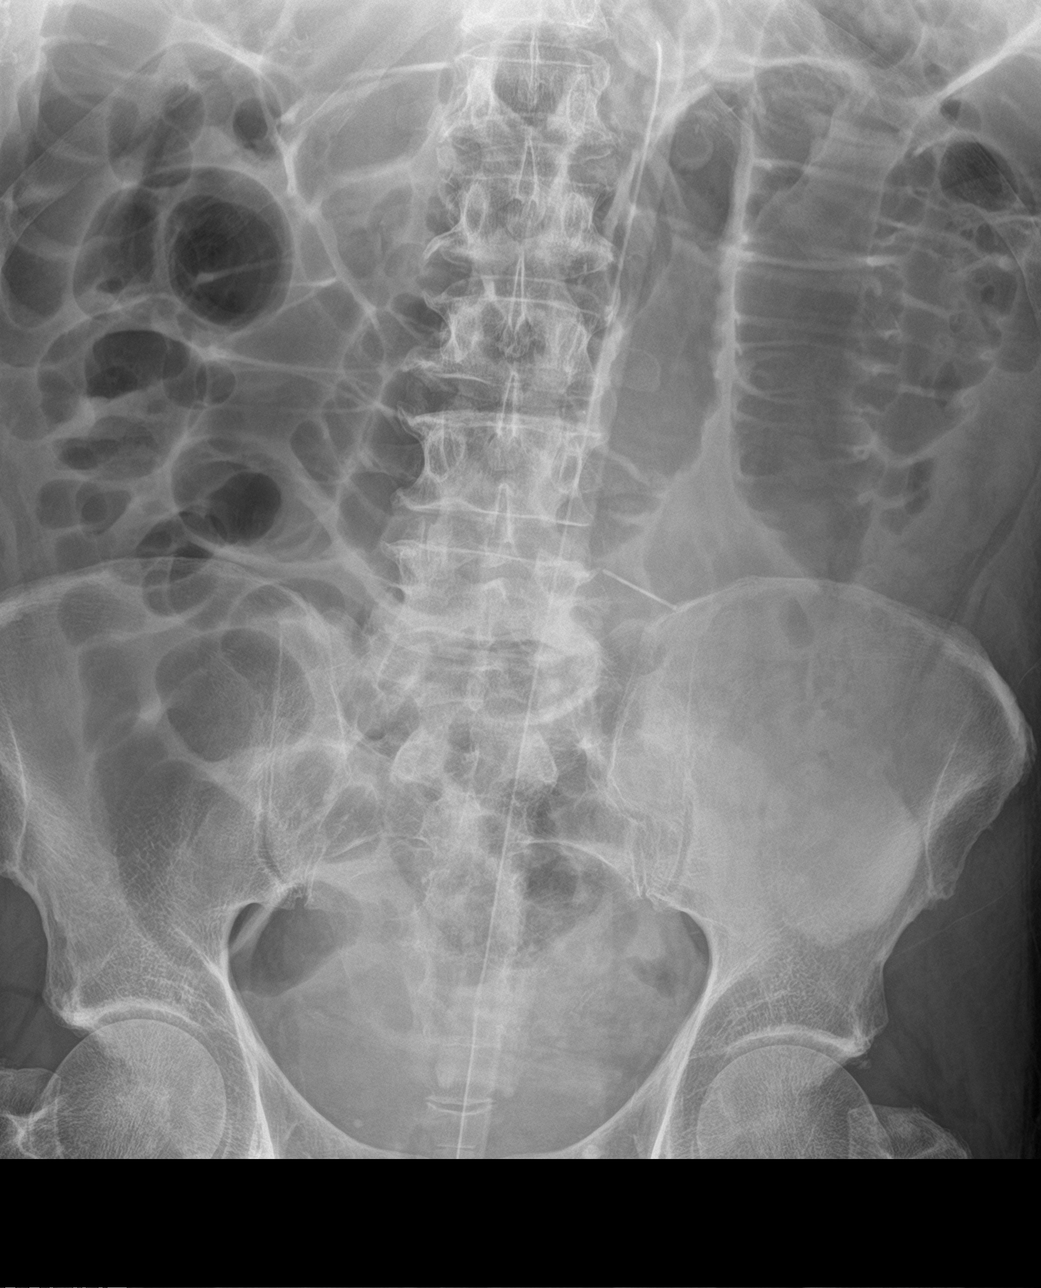

[abdomen supine (2 of 2)]
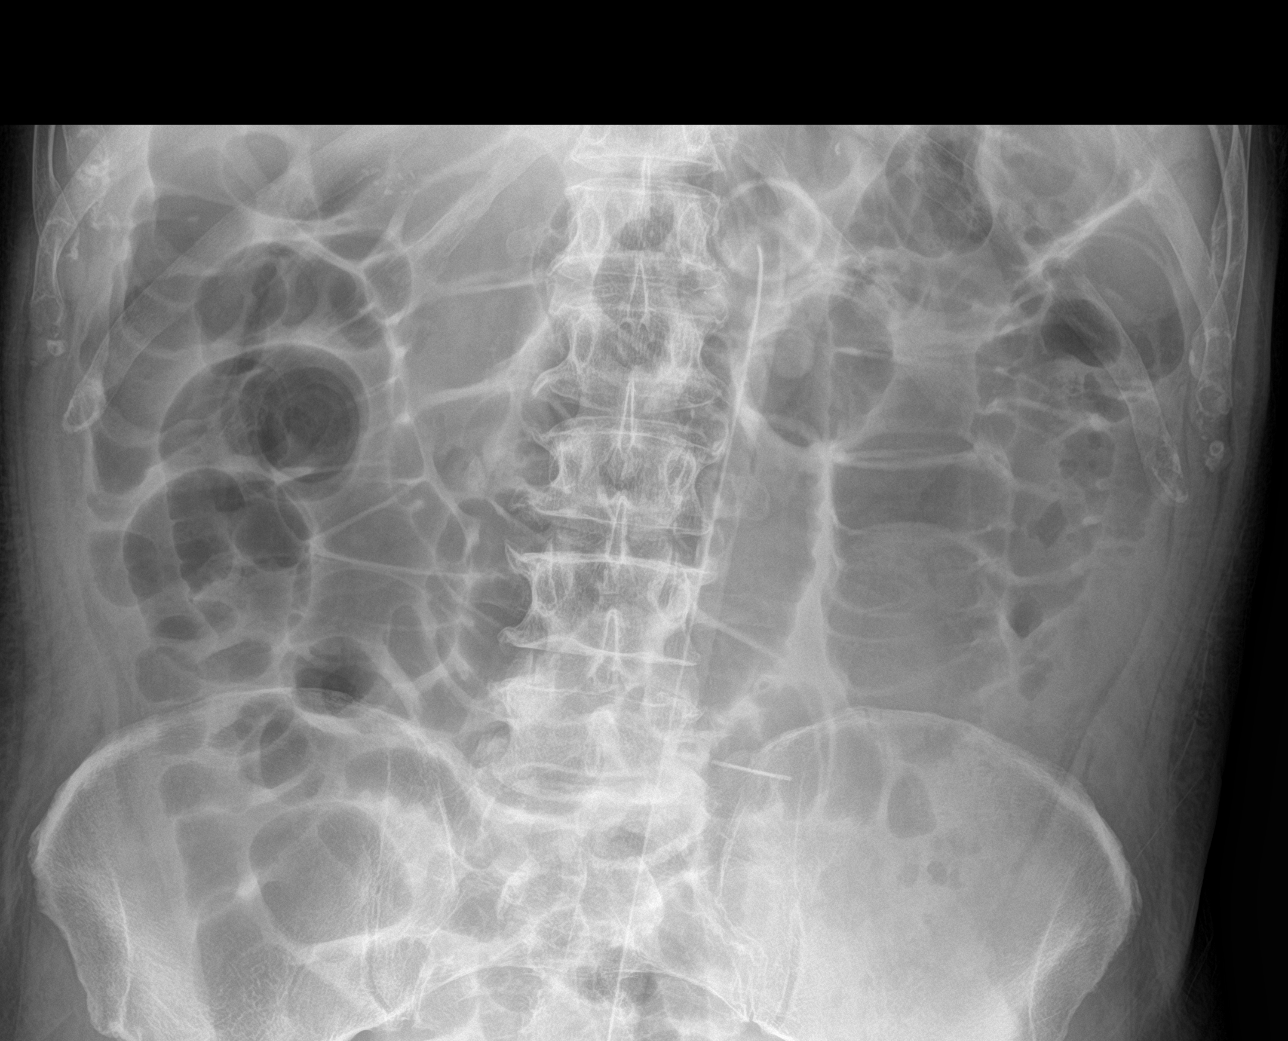

[3 of 3 positions shown; findings below may reference images not displayed]

FINDINGS: Gastrostomy tube projects over the left upper abdomen. Gaseous
distention of small bowel loops again noted with air-fluid levels
compatible with small bowel obstruction. No significant change since
prior study. No free air organomegaly. No acute bony abnormality.
IMPRESSION: Small bowel obstruction pattern, unchanged.

## 2018-07-23 NOTE — Telephone Encounter (Signed)
[facility] following liver per pt.  Lets go ahead and get a hepatits B surface antibody level and a hepatitis A total antibody level and see if he needs to be immunized.

## 2018-07-26 ENCOUNTER — Other Ambulatory Visit: Payer: Self-pay

## 2018-07-26 DIAGNOSIS — K7030 Alcoholic cirrhosis of liver without ascites: Secondary | ICD-10-CM

## 2018-07-26 NOTE — Telephone Encounter (Signed)
Lmom, waiting on a return call.  

## 2018-07-26 NOTE — Telephone Encounter (Signed)
Pt returned called. Lab orders placed and mailed to pt, per pts request.

## 2018-08-01 ENCOUNTER — Other Ambulatory Visit (HOSPITAL_COMMUNITY)
Admission: RE | Admit: 2018-08-01 | Discharge: 2018-08-01 | Disposition: A | Discharge status: Home / Self Care | Payer: Medicare Other | Source: Ambulatory Visit | Attending: Internal Medicine | Admitting: Internal Medicine

## 2018-08-01 DIAGNOSIS — K7030 Alcoholic cirrhosis of liver without ascites: Secondary | ICD-10-CM | POA: Diagnosis not present

## 2018-08-01 DIAGNOSIS — E782 Mixed hyperlipidemia: Secondary | ICD-10-CM | POA: Diagnosis not present

## 2018-08-01 DIAGNOSIS — I10 Essential (primary) hypertension: Secondary | ICD-10-CM | POA: Diagnosis not present

## 2018-08-01 DIAGNOSIS — F321 Major depressive disorder, single episode, moderate: Secondary | ICD-10-CM | POA: Diagnosis not present

## 2018-08-01 DIAGNOSIS — F1911 Other psychoactive substance abuse, in remission: Secondary | ICD-10-CM | POA: Diagnosis not present

## 2018-08-02 LAB — HEPATITIS A ANTIBODY, TOTAL: Hep A Total Ab: NEGATIVE

## 2018-08-02 LAB — HEPATITIS B SURFACE ANTIBODY, QUANTITATIVE: Hep B S AB Quant (Post): <3.1 m[IU]/mL — ABNORMAL LOW (ref >9.9)

## 2018-10-20 ENCOUNTER — Ambulatory Visit (INDEPENDENT_AMBULATORY_CARE_PROVIDER_SITE_OTHER): Payer: Medicare Other | Admitting: Otolaryngology

## 2018-10-20 ENCOUNTER — Other Ambulatory Visit: Payer: Self-pay

## 2018-10-20 DIAGNOSIS — Z85818 Personal history of malignant neoplasm of other sites of lip, oral cavity, and pharynx: Secondary | ICD-10-CM | POA: Diagnosis not present

## 2018-10-24 DIAGNOSIS — I10 Essential (primary) hypertension: Secondary | ICD-10-CM | POA: Diagnosis not present

## 2018-10-24 DIAGNOSIS — E782 Mixed hyperlipidemia: Secondary | ICD-10-CM | POA: Diagnosis not present

## 2018-10-25 DIAGNOSIS — Z85818 Personal history of malignant neoplasm of other sites of lip, oral cavity, and pharynx: Secondary | ICD-10-CM | POA: Diagnosis not present

## 2018-10-25 DIAGNOSIS — Z08 Encounter for follow-up examination after completed treatment for malignant neoplasm: Secondary | ICD-10-CM | POA: Diagnosis not present

## 2018-10-25 DIAGNOSIS — C039 Malignant neoplasm of gum, unspecified: Secondary | ICD-10-CM | POA: Diagnosis not present

## 2018-10-31 DIAGNOSIS — F1911 Other psychoactive substance abuse, in remission: Secondary | ICD-10-CM | POA: Diagnosis not present

## 2018-10-31 DIAGNOSIS — E782 Mixed hyperlipidemia: Secondary | ICD-10-CM | POA: Diagnosis not present

## 2018-10-31 DIAGNOSIS — I10 Essential (primary) hypertension: Secondary | ICD-10-CM | POA: Diagnosis not present

## 2018-10-31 DIAGNOSIS — F321 Major depressive disorder, single episode, moderate: Secondary | ICD-10-CM | POA: Diagnosis not present

## 2018-11-09 DIAGNOSIS — Z923 Personal history of irradiation: Secondary | ICD-10-CM | POA: Diagnosis not present

## 2018-11-09 DIAGNOSIS — Z08 Encounter for follow-up examination after completed treatment for malignant neoplasm: Secondary | ICD-10-CM | POA: Diagnosis not present

## 2018-11-09 DIAGNOSIS — Z9221 Personal history of antineoplastic chemotherapy: Secondary | ICD-10-CM | POA: Diagnosis not present

## 2018-11-09 DIAGNOSIS — Z9089 Acquired absence of other organs: Secondary | ICD-10-CM | POA: Diagnosis not present

## 2018-11-09 DIAGNOSIS — Z6825 Body mass index (BMI) 25.0-25.9, adult: Secondary | ICD-10-CM | POA: Diagnosis not present

## 2018-11-09 DIAGNOSIS — C049 Malignant neoplasm of floor of mouth, unspecified: Secondary | ICD-10-CM | POA: Diagnosis not present

## 2018-11-09 DIAGNOSIS — C099 Malignant neoplasm of tonsil, unspecified: Secondary | ICD-10-CM | POA: Diagnosis not present

## 2018-11-09 DIAGNOSIS — Z85818 Personal history of malignant neoplasm of other sites of lip, oral cavity, and pharynx: Secondary | ICD-10-CM | POA: Diagnosis not present

## 2018-11-28 ENCOUNTER — Other Ambulatory Visit: Payer: Self-pay

## 2018-11-28 ENCOUNTER — Inpatient Hospital Stay (HOSPITAL_COMMUNITY): Payer: Medicare Other | Attending: Hematology

## 2018-11-28 DIAGNOSIS — Z933 Colostomy status: Secondary | ICD-10-CM | POA: Diagnosis not present

## 2018-11-28 DIAGNOSIS — C090 Malignant neoplasm of tonsillar fossa: Secondary | ICD-10-CM | POA: Insufficient documentation

## 2018-11-28 DIAGNOSIS — Z9221 Personal history of antineoplastic chemotherapy: Secondary | ICD-10-CM | POA: Insufficient documentation

## 2018-11-28 DIAGNOSIS — F329 Major depressive disorder, single episode, unspecified: Secondary | ICD-10-CM | POA: Diagnosis not present

## 2018-11-28 DIAGNOSIS — I10 Essential (primary) hypertension: Secondary | ICD-10-CM | POA: Insufficient documentation

## 2018-11-28 DIAGNOSIS — F419 Anxiety disorder, unspecified: Secondary | ICD-10-CM | POA: Insufficient documentation

## 2018-11-28 DIAGNOSIS — Z7982 Long term (current) use of aspirin: Secondary | ICD-10-CM | POA: Insufficient documentation

## 2018-11-28 DIAGNOSIS — Z85820 Personal history of malignant melanoma of skin: Secondary | ICD-10-CM | POA: Diagnosis not present

## 2018-11-28 DIAGNOSIS — Z87891 Personal history of nicotine dependence: Secondary | ICD-10-CM | POA: Insufficient documentation

## 2018-11-28 DIAGNOSIS — Z79899 Other long term (current) drug therapy: Secondary | ICD-10-CM | POA: Insufficient documentation

## 2018-11-28 DIAGNOSIS — C099 Malignant neoplasm of tonsil, unspecified: Secondary | ICD-10-CM

## 2018-11-28 DIAGNOSIS — Z923 Personal history of irradiation: Secondary | ICD-10-CM | POA: Diagnosis not present

## 2018-11-28 LAB — CBC WITH DIFFERENTIAL/PLATELET
Abs Immature Granulocytes: 0.04 10*3/uL (ref 0.00–0.07)
Basophils Absolute: 0.1 10*3/uL (ref 0.0–0.1)
Basophils Relative: 2 %
Eosinophils Absolute: 0.2 10*3/uL (ref 0.0–0.5)
Eosinophils Relative: 3 %
HCT: 41.6 % (ref 39.0–52.0)
Hemoglobin: 13 g/dL (ref 13.0–17.0)
Immature Granulocytes: 1 %
Lymphocytes Relative: 17 %
Lymphs Abs: 1 10*3/uL (ref 0.7–4.0)
MCH: 27.5 pg (ref 26.0–34.0)
MCHC: 31.3 g/dL (ref 30.0–36.0)
MCV: 87.9 fL (ref 80.0–100.0)
Monocytes Absolute: 0.6 10*3/uL (ref 0.1–1.0)
Monocytes Relative: 10 %
Neutro Abs: 4.3 10*3/uL (ref 1.7–7.7)
Neutrophils Relative %: 67 %
Platelets: 263 10*3/uL (ref 150–400)
RBC: 4.73 MIL/uL (ref 4.22–5.81)
RDW: 15.2 % (ref 11.5–15.5)
WBC: 6.3 10*3/uL (ref 4.0–10.5)
nRBC: 0 % (ref 0.0–0.2)

## 2018-11-28 LAB — COMPREHENSIVE METABOLIC PANEL
ALT: 15 U/L (ref 0–44)
AST: 14 U/L — ABNORMAL LOW (ref 15–41)
Albumin: 4.1 g/dL (ref 3.5–5.0)
Alkaline Phosphatase: 41 U/L (ref 38–126)
Anion gap: 7 (ref 5–15)
BUN: 15 mg/dL (ref 8–23)
CO2: 26 mmol/L (ref 22–32)
Calcium: 9.4 mg/dL (ref 8.9–10.3)
Chloride: 104 mmol/L (ref 98–111)
Creatinine, Ser: 0.77 mg/dL (ref 0.61–1.24)
GFR calc Af Amer: >60 mL/min (ref >60)
GFR calc non Af Amer: >60 mL/min (ref >60)
Glucose, Bld: 114 mg/dL — ABNORMAL HIGH (ref 70–99)
Potassium: 4.9 mmol/L (ref 3.5–5.1)
Sodium: 137 mmol/L (ref 135–145)
Total Bilirubin: 0.5 mg/dL (ref 0.3–1.2)
Total Protein: 6.8 g/dL (ref 6.5–8.1)

## 2018-11-28 LAB — TSH: TSH: 1.742 u[IU]/mL (ref 0.350–4.500)

## 2018-12-05 ENCOUNTER — Inpatient Hospital Stay (HOSPITAL_BASED_OUTPATIENT_CLINIC_OR_DEPARTMENT_OTHER): Payer: Medicare Other | Admitting: Hematology

## 2018-12-05 ENCOUNTER — Other Ambulatory Visit: Payer: Self-pay

## 2018-12-05 ENCOUNTER — Encounter (HOSPITAL_COMMUNITY): Payer: Self-pay | Admitting: Hematology

## 2018-12-05 DIAGNOSIS — C090 Malignant neoplasm of tonsillar fossa: Secondary | ICD-10-CM

## 2018-12-05 DIAGNOSIS — Z923 Personal history of irradiation: Secondary | ICD-10-CM

## 2018-12-05 DIAGNOSIS — Z85820 Personal history of malignant melanoma of skin: Secondary | ICD-10-CM | POA: Diagnosis not present

## 2018-12-05 DIAGNOSIS — Z79899 Other long term (current) drug therapy: Secondary | ICD-10-CM

## 2018-12-05 DIAGNOSIS — F419 Anxiety disorder, unspecified: Secondary | ICD-10-CM

## 2018-12-05 DIAGNOSIS — F329 Major depressive disorder, single episode, unspecified: Secondary | ICD-10-CM | POA: Diagnosis not present

## 2018-12-05 DIAGNOSIS — Z87891 Personal history of nicotine dependence: Secondary | ICD-10-CM | POA: Diagnosis not present

## 2018-12-05 DIAGNOSIS — Z933 Colostomy status: Secondary | ICD-10-CM

## 2018-12-05 DIAGNOSIS — C099 Malignant neoplasm of tonsil, unspecified: Secondary | ICD-10-CM

## 2018-12-05 DIAGNOSIS — I10 Essential (primary) hypertension: Secondary | ICD-10-CM | POA: Diagnosis not present

## 2018-12-05 DIAGNOSIS — Z7982 Long term (current) use of aspirin: Secondary | ICD-10-CM

## 2018-12-05 DIAGNOSIS — Z9221 Personal history of antineoplastic chemotherapy: Secondary | ICD-10-CM

## 2018-12-05 NOTE — Progress Notes (Signed)
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 Isidore Moos.   08/21/2015 - 09/10/2015 Chemotherapy   Cisplatin every 21 days with XRT.   08/28/2015 Adverse Reaction   Tinnitis, Cisplatin-induced.   09/11/2015 - 09/30/2015 Chemotherapy   Carboplatin/5FU   09/20/2015 - 09/21/2015 Hospital Admission   Intractable nausea/vomiting. Severe Mucositis   10/17/2015 - 10/20/2015 Hospital Admission   Esophageal/oral candidasis. Shingles. Fever. Chemotherapy induced pancytopenia   01/24/2016 PET scan   Resolution of the left tonsillar mass and left neck adenopathy. No findings for residual or locally recurrent tumor, adenopathy or distant metastatic disease.   03/28/2016 - 04/08/2016 Hospital Admission   Admitted with severe abdominal pain. Found to diffuse peritonitis from perforated sigmoid colon requiring colostomy. Discharged to [facility] in [place].          CANCER STAGING: Cancer Staging Tonsil cancer ([place]) Staging form: Pharynx - Oropharynx, AJCC 7th Edition - Clinical: Stage IVA (T2, N2b, M0) - Signed by Eppie Gibson, MD on 08/02/2015    INTERVAL HISTORY:  Frank Castillo 68 y.o. male returns for follow-up of his head and neck cancer.  Appetite is 75%.  Energy levels are 50%.  He has trouble chewing food since his surgery.  This has been stable.  He does report occasional dizziness when he stands up.  This is not common.  Anxiety and depression  are at his baseline.  Denies any weight loss in the last 6 months.  Denies any fevers or night sweats.  No nausea, vomiting, diarrhea or constipation reported.  No ER visits or hospitalizations.   REVIEW OF SYSTEMS:  Review of Systems  Psychiatric/Behavioral: Positive for depression. The patient is nervous/anxious.   All other systems reviewed and are negative.    PAST MEDICAL/SURGICAL HISTORY:  Past Medical History:  Diagnosis Date  . Cancer ([place])   . Cirrhosis ([place])    Followed at [facility]  . GERD (gastroesophageal reflux disease)   . History of bowel resection   . History of tonsillectomy    Dr. Benjamine Mola, 06/2015  . Hypertension   . Iron deficiency anemia 05/07/2016  . Melanoma ([place])    followed by Dr. Cleophas Dunker  . Portal hypertension ([place])   . Substance abuse ([place])    alcohol  . Tonsillar cancer ([place]) 06/2015   SCC  . Tonsillar mass    left   Past Surgical History:  Procedure Laterality Date  . BIOPSY  07/02/2016   Procedure: BIOPSY;  Surgeon: Daneil Dolin, MD;  [facility];  Service: Endoscopy;;  esophageal  . COLON SURGERY    . COLONOSCOPY WITH PROPOFOL N/A 07/02/2016   Procedure: COLONOSCOPY WITH PROPOFOL;  Surgeon: Daneil Dolin, MD;  [facility];  Service: Endoscopy;  Laterality: N/A;  9:45am  . COLOSTOMY TAKEDOWN N/A 09/02/2016   Procedure: LAPAROSCOPIC COLOSTOMY REVERSAL;  Surgeon: Leighton Ruff, MD;  [facility]: [facility];  Service: General;  Laterality: N/A;  .  ESOPHAGOGASTRODUODENOSCOPY (EGD) WITH PROPOFOL N/A 07/02/2016   Procedure: ESOPHAGOGASTRODUODENOSCOPY (EGD) WITH PROPOFOL;  Surgeon: Daneil Dolin, MD;  [facility];  Service: Endoscopy;  Laterality: N/A;  . HERNIA REPAIR Right   . INCISIONAL HERNIA REPAIR  09/02/2016   Procedure: HERNIA REPAIR INCISIONAL;  Surgeon: Leighton Ruff, MD;  [facility];  Service: General;;  . LAPAROTOMY N/A 03/28/2016   Procedure: EXPLORATORY LAPAROTOMY, SIGMOID COLECTOMY, COLOSTOMY;  Surgeon:  Leighton Ruff, MD;  [facility];  Service: General;  Laterality: N/A;  [name] DILATION N/A 07/02/2016   Procedure: Keturah Shavers;  Surgeon: Daneil Dolin, MD;  [facility];  Service: Endoscopy;  Laterality: N/A;  . MOUTH SURGERY    . PORT-A-CATH REMOVAL  09/02/2016   Procedure: REMOVAL PORT-A-CATH;  Surgeon: Leighton Ruff, MD;  [facility];  Service: General;;  . REMOVAL OF GASTROSTOMY TUBE N/A 09/02/2016   Procedure: REMOVAL OF GASTROSTOMY TUBE;  Surgeon: Leighton Ruff, MD;  [facility];  Service: General;  Laterality: N/A;  . TONSILLECTOMY Left 07/09/2015   Procedure: LEFT TONSILLECTOMY;  Surgeon: Leta Baptist, MD;  Location: [facility];  Service: ENT;  Laterality: Left;  . TONSILLECTOMY    . Z-PLASTY SCAR REVISION N/A 09/02/2016   Procedure: SCAR REVISION;  Surgeon: Leighton Ruff, MD;  [facility];  Service: General;  Laterality: N/A;     SOCIAL HISTORY:  Social History   Socioeconomic History  . Marital status: Single    Spouse name: Not on file  . Number of children: 0  . Years of education: Not on file  . Highest education level: Not on file  Occupational History  . Not on file  Social Needs  . Financial resource strain: Not on file  . Food insecurity    Worry: Not on file    Inability: Not on file  . Transportation needs    Medical: Not on file    Non-medical: Not on file  Tobacco Use  . Smoking status: Former Smoker    Packs/day: 0.50    Years: 30.00    Pack years: 15.00    Types: Cigarettes    Quit date: 11/22/2016    Years since quitting: 2.0  . Smokeless tobacco: Never Used  Substance and Sexual Activity  . Alcohol use: No    Alcohol/week: 0.0 standard drinks    Comment: former abuse- quit 09/2011  . Drug use: No  . Sexual activity: Not on file  Lifestyle  . Physical activity    Days per week: Not on file    Minutes per session: Not on file  . Stress: Not on file  Relationships  . Social Herbalist  on phone: Not on file    Gets together: Not on file    Attends religious service: Not on file    Active member of club or organization: Not on file    Attends meetings of clubs or organizations: Not on file    Relationship status: Not on file  . Intimate partner violence    Fear of current or ex partner: Not on file    Emotionally abused: Not on file    Physically abused: Not on file    Forced sexual activity: Not on file  Other Topics Concern  . Not on file  Social History Narrative   Divorced.   No children.   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    Fully retired at this time.    Has been smoking off/on since age 33, quit smoking 11/2016.    Exercises minimally   Eats all food groups.   Wears seatbelt.   Mother lives with him, has dementia.   Attends AA meetings.     FAMILY HISTORY:  Family History  Problem Relation Age of Onset  . Macular degeneration Mother   . Dementia Mother   . Alcoholism Father   . Diabetes Maternal Aunt   . Diabetes Maternal Uncle   . Mental illness Sister     CURRENT MEDICATIONS:  Outpatient Encounter Medications as of 12/05/2018  Medication Sig  . aspirin EC 81 MG tablet Take 1 tablet (81 mg total) by mouth daily.  Marland Kitchen atorvastatin (LIPITOR) 40 MG tablet Take 1 tablet (40 mg total) by mouth daily.  . cetirizine (ZYRTEC) 10 MG tablet Take 10 mg by mouth as needed.   . CHANTIX STARTING MONTH PAK 0.5 MG X 11 & 1 MG X 42 tablet Take by mouth See admin instructions.   . diphenhydrAMINE (BENADRYL) 50 MG tablet Take 50 mg by mouth at bedtime as needed for sleep.  . DULoxetine (CYMBALTA) 60 MG capsule Take 120 mg by mouth daily.  Marland Kitchen lisinopril (PRINIVIL,ZESTRIL) 10 MG tablet Take 1 tablet (10 mg total) by mouth daily.  . Melatonin 10 MG TABS Take 1 tablet by mouth at bedtime.  . MULTIPLE VITAMINS-MINERALS ER PO Take 1 tablet by mouth daily.   . pantoprazole (PROTONIX) 40 MG tablet Take 1  tablet (40 mg total) by mouth daily.  . sodium fluoride (FLUORISHIELD) 1.1 % GEL dental gel Instill one drop of gel per tooth space of fluoride tray. Place over teeth for 5 minutes. Remove. Spit out excess. Repeat nightly.  . triamcinolone cream (KENALOG) 0.1 % Apply 1 application 2 (two) times daily topically.  . [DISCONTINUED] DEXILANT 60 MG capsule Take 1 capsule by mouth daily.  . [DISCONTINUED] DULoxetine (CYMBALTA) 30 MG capsule Take 60 mg by mouth daily.   . [DISCONTINUED] vitamin B-12 (CYANOCOBALAMIN) 1000 MCG tablet Take 1,000 mcg by mouth daily.   Facility-Administered Encounter Medications as of 12/05/2018  Medication  . 0.9 %  sodium chloride infusion  . 0.9 %  sodium chloride infusion  . 0.9 %  sodium chloride infusion  . 0.9 %  sodium chloride infusion  . 0.9 %  sodium chloride infusion    ALLERGIES:  Allergies  Allergen Reactions  . Bee Venom Swelling  . Other Other (See Comments)    Opthalmic mycin irritates eyes.  Opthalmic mycin irritates eyes.      PHYSICAL EXAM:  ECOG Performance status: 1  Vitals:   12/05/18 1121  BP: 119/84  Pulse: 80  Resp: 16  Temp: (!) 97.5 F (36.4 C)  SpO2: 100%   Filed Weights   12/05/18 1121  Weight: 177 lb 6.4 oz (80.5 kg)    Physical Exam Constitutional:      Appearance: Normal appearance. He is normal weight.  HENT:     Mouth/Throat:     Pharynx: No oropharyngeal exudate or posterior oropharyngeal erythema.  Neck:     Musculoskeletal: No neck rigidity.  Cardiovascular:     Rate and Rhythm: Normal rate and regular rhythm.     Heart sounds: Normal heart sounds.  Pulmonary:     Effort: Pulmonary effort is normal.     Breath sounds: Normal breath sounds.  Abdominal:     General: There is no  distension.     Palpations: Abdomen is soft. There is no mass.  Musculoskeletal: Normal range of motion.  Lymphadenopathy:     Cervical: No cervical adenopathy.  Skin:    General: Skin is warm and dry.  Neurological:      Mental Status: He is alert and oriented to person, place, and time. Mental status is at baseline.  Psychiatric:        Mood and Affect: Mood normal.        Behavior: Behavior normal.      LABORATORY DATA:  I have reviewed the labs as listed.  CBC    Component Value Date/Time   WBC 6.3 11/28/2018 1106   RBC 4.73 11/28/2018 1106   HGB 13.0 11/28/2018 1106   HGB 11.5 (L) 10/21/2011 1614   HCT 41.6 11/28/2018 1106   HCT 34.6 (L) 10/21/2011 1614   PLT 263 11/28/2018 1106   PLT 209 10/21/2011 1614   MCV 87.9 11/28/2018 1106   MCV 101 (H) 10/21/2011 1614   MCH 27.5 11/28/2018 1106   MCHC 31.3 11/28/2018 1106   RDW 15.2 11/28/2018 1106   RDW 14.0 10/21/2011 1614   LYMPHSABS 1.0 11/28/2018 1106   LYMPHSABS 1.9 10/20/2011 0609   MONOABS 0.6 11/28/2018 1106   MONOABS 1.1 (H) 10/20/2011 0609   EOSABS 0.2 11/28/2018 1106   EOSABS 0.1 10/20/2011 0609   BASOSABS 0.1 11/28/2018 1106   BASOSABS 0.1 10/20/2011 0609   CMP Latest Ref Rng & Units 11/28/2018 05/30/2018 01/20/2018  Glucose 70 - 99 mg/dL 114(H) 119(H) -  BUN 8 - 23 mg/dL 15 12 -  Creatinine 0.61 - 1.24 mg/dL 0.77 0.92 -  Sodium 135 - 145 mmol/L 137 137 -  Potassium 3.5 - 5.1 mmol/L 4.9 4.1 -  Chloride 98 - 111 mmol/L 104 107 -  CO2 22 - 32 mmol/L 26 24 -  Calcium 8.9 - 10.3 mg/dL 9.4 9.1 -  Total Protein 6.5 - 8.1 g/dL 6.8 7.1 6.8  Total Bilirubin 0.3 - 1.2 mg/dL 0.5 0.5 0.4  Alkaline Phos 38 - 126 U/L 41 44 -  AST 15 - 41 U/L 14(L) 18 12  ALT 0 - 44 U/L 15 20 11        DIAGNOSTIC IMAGING:  I have independently reviewed the scans and discussed with the patient.     ASSESSMENT & PLAN:   Tonsil cancer ([place]) 1.  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 - Today's physical exam did not  reveal any palpable lymphadenopathy in the neck region.  No masses in the oral cavity. -Denies any dysphagia.  We reviewed his blood work including TSH which were within normal limits. -We will see him back in 6 months for follow-up.  2.  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 Benjamine Mola, referred to Dr. Nicolette Bang who did marginal mandibulectomy with primary closure on 09/20/2017.  This showed 2.1 x 0.9 x 0.4 cm squamous cell carcinoma with mandibular bone invasion. - Today's examination in the oral cavity did not reveal any suspicious masses. - He reportedly has a CT scan of the neck scheduled in October at [facility]. -We will see him back in 6 months for follow-up with repeat blood work.    Total  time spent is 25 minutes with more than 50% of the time spent face-to-face discussing surveillance plan, counseling and coordination of care.  Orders placed this encounter:  No orders of the defined types were placed in this encounter.     Derek Jack, MD [place] [phone]

## 2018-12-05 NOTE — Patient Instructions (Addendum)
[facility] at [facility] Discharge Instructions  You were seen today by Dr. Katragadda. He went over your recent lab results. He will see you back in 6 months for labs and follow up.   Thank you for choosing [facility] at [facility] to provide your oncology and hematology care.  To afford each patient quality time with our provider, please arrive at least 15 minutes before your scheduled appointment time.   If you have a lab appointment with the Cancer Center please come in thru the  Main Entrance and check in at the main information desk  You need to re-schedule your appointment should you arrive 10 or more minutes late.  We strive to give you quality time with our providers, and arriving late affects you and other patients whose appointments are after yours.  Also, if you no show three or more times for appointments you may be dismissed from the clinic at the providers discretion.     Again, thank you for choosing [facility].  Our hope is that these requests will decrease the amount of time that you wait before being seen by our physicians.       _____________________________________________________________  Should you have questions after your visit to  Cancer Center, please contact our office at [phone] between the hours of 8:00 a.m. and 4:30 p.m.  Voicemails left after 4:00 p.m. will not be returned until the following business day.  For prescription refill requests, have your pharmacy contact our office and allow 72 hours.    Cancer Center Support Programs:   > Cancer Support Group  2nd Tuesday of the month 1pm-2pm, Journey Room    

## 2018-12-05 NOTE — Assessment & Plan Note (Signed)
1.  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 - Today's physical exam did not reveal any palpable lymphadenopathy in the neck region.  No masses in the oral cavity. -Denies any dysphagia.  We reviewed his blood work including TSH which were within normal limits. -We will see him back in 6 months for follow-up.  2.  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 Benjamine Mola, referred to Dr. Nicolette Bang who did marginal mandibulectomy with primary closure on 09/20/2017.  This showed 2.1 x 0.9 x 0.4 cm squamous cell carcinoma with mandibular bone invasion. - Today's examination in the oral cavity did not reveal any suspicious masses. - He reportedly has a CT scan of the neck scheduled in October at [facility]. -We will see him back in 6 months for follow-up with repeat blood work.

## 2018-12-21 DIAGNOSIS — I10 Essential (primary) hypertension: Secondary | ICD-10-CM | POA: Diagnosis not present

## 2018-12-21 DIAGNOSIS — E782 Mixed hyperlipidemia: Secondary | ICD-10-CM | POA: Diagnosis not present

## 2018-12-21 DIAGNOSIS — F321 Major depressive disorder, single episode, moderate: Secondary | ICD-10-CM | POA: Diagnosis not present

## 2018-12-21 DIAGNOSIS — Z720 Tobacco use: Secondary | ICD-10-CM | POA: Diagnosis not present

## 2018-12-21 DIAGNOSIS — E781 Pure hyperglyceridemia: Secondary | ICD-10-CM | POA: Diagnosis not present

## 2018-12-28 DIAGNOSIS — L280 Lichen simplex chronicus: Secondary | ICD-10-CM | POA: Diagnosis not present

## 2018-12-28 DIAGNOSIS — Z85820 Personal history of malignant melanoma of skin: Secondary | ICD-10-CM | POA: Diagnosis not present

## 2018-12-28 DIAGNOSIS — L570 Actinic keratosis: Secondary | ICD-10-CM | POA: Diagnosis not present

## 2019-02-16 MED FILL — SF 1.1% GEL: 1.1 | 30 days supply | Qty: 56 | Fill #0

## 2019-02-17 MED FILL — SF 1.1% GEL: 1.1 | 30 days supply | Qty: 56 | Fill #1

## 2019-02-28 DIAGNOSIS — Z8589 Personal history of malignant neoplasm of other organs and systems: Secondary | ICD-10-CM | POA: Diagnosis not present

## 2019-02-28 DIAGNOSIS — Z9221 Personal history of antineoplastic chemotherapy: Secondary | ICD-10-CM | POA: Diagnosis not present

## 2019-02-28 DIAGNOSIS — Z9889 Other specified postprocedural states: Secondary | ICD-10-CM | POA: Diagnosis not present

## 2019-02-28 DIAGNOSIS — C039 Malignant neoplasm of gum, unspecified: Secondary | ICD-10-CM | POA: Diagnosis not present

## 2019-04-12 DIAGNOSIS — Z923 Personal history of irradiation: Secondary | ICD-10-CM | POA: Diagnosis not present

## 2019-04-12 DIAGNOSIS — Z85818 Personal history of malignant neoplasm of other sites of lip, oral cavity, and pharynx: Secondary | ICD-10-CM | POA: Diagnosis not present

## 2019-04-12 DIAGNOSIS — Z9221 Personal history of antineoplastic chemotherapy: Secondary | ICD-10-CM | POA: Diagnosis not present

## 2019-04-12 DIAGNOSIS — F17200 Nicotine dependence, unspecified, uncomplicated: Secondary | ICD-10-CM | POA: Diagnosis not present

## 2019-04-12 DIAGNOSIS — C049 Malignant neoplasm of floor of mouth, unspecified: Secondary | ICD-10-CM | POA: Diagnosis not present

## 2019-04-12 DIAGNOSIS — Z08 Encounter for follow-up examination after completed treatment for malignant neoplasm: Secondary | ICD-10-CM | POA: Diagnosis not present

## 2019-04-12 DIAGNOSIS — Z6826 Body mass index (BMI) 26.0-26.9, adult: Secondary | ICD-10-CM | POA: Diagnosis not present

## 2019-04-12 DIAGNOSIS — C099 Malignant neoplasm of tonsil, unspecified: Secondary | ICD-10-CM | POA: Diagnosis not present

## 2019-04-13 ENCOUNTER — Ambulatory Visit (INDEPENDENT_AMBULATORY_CARE_PROVIDER_SITE_OTHER): Payer: Medicare Other | Admitting: Otolaryngology

## 2019-04-13 ENCOUNTER — Other Ambulatory Visit: Payer: Self-pay

## 2019-05-26 HISTORY — PX: PROSTATE BIOPSY: SHX241

## 2019-06-05 ENCOUNTER — Other Ambulatory Visit (HOSPITAL_COMMUNITY): Payer: Self-pay | Admitting: *Deleted

## 2019-06-05 DIAGNOSIS — C099 Malignant neoplasm of tonsil, unspecified: Secondary | ICD-10-CM

## 2019-06-06 ENCOUNTER — Other Ambulatory Visit: Payer: Self-pay

## 2019-06-06 ENCOUNTER — Inpatient Hospital Stay (HOSPITAL_COMMUNITY): Payer: Medicare Other | Attending: Hematology

## 2019-06-06 DIAGNOSIS — Z933 Colostomy status: Secondary | ICD-10-CM | POA: Diagnosis not present

## 2019-06-06 DIAGNOSIS — Z85818 Personal history of malignant neoplasm of other sites of lip, oral cavity, and pharynx: Secondary | ICD-10-CM | POA: Insufficient documentation

## 2019-06-06 DIAGNOSIS — Z9221 Personal history of antineoplastic chemotherapy: Secondary | ICD-10-CM | POA: Insufficient documentation

## 2019-06-06 DIAGNOSIS — Z79899 Other long term (current) drug therapy: Secondary | ICD-10-CM | POA: Insufficient documentation

## 2019-06-06 DIAGNOSIS — C099 Malignant neoplasm of tonsil, unspecified: Secondary | ICD-10-CM

## 2019-06-06 DIAGNOSIS — Z923 Personal history of irradiation: Secondary | ICD-10-CM | POA: Diagnosis not present

## 2019-06-06 LAB — CBC WITH DIFFERENTIAL/PLATELET
Abs Immature Granulocytes: 0.04 10*3/uL (ref 0.00–0.07)
Basophils Absolute: 0.2 10*3/uL — ABNORMAL HIGH (ref 0.0–0.1)
Basophils Relative: 2 %
Eosinophils Absolute: 0.3 10*3/uL (ref 0.0–0.5)
Eosinophils Relative: 4 %
HCT: 42.8 % (ref 39.0–52.0)
Hemoglobin: 13.3 g/dL (ref 13.0–17.0)
Immature Granulocytes: 1 %
Lymphocytes Relative: 16 %
Lymphs Abs: 1.2 10*3/uL (ref 0.7–4.0)
MCH: 27.7 pg (ref 26.0–34.0)
MCHC: 31.1 g/dL (ref 30.0–36.0)
MCV: 89 fL (ref 80.0–100.0)
Monocytes Absolute: 0.7 10*3/uL (ref 0.1–1.0)
Monocytes Relative: 10 %
Neutro Abs: 5.2 10*3/uL (ref 1.7–7.7)
Neutrophils Relative %: 67 %
Platelets: 317 10*3/uL (ref 150–400)
RBC: 4.81 MIL/uL (ref 4.22–5.81)
RDW: 14.5 % (ref 11.5–15.5)
WBC: 7.6 10*3/uL (ref 4.0–10.5)
nRBC: 0 % (ref 0.0–0.2)

## 2019-06-06 LAB — TSH: TSH: 2.417 u[IU]/mL (ref 0.350–4.500)

## 2019-06-06 LAB — COMPREHENSIVE METABOLIC PANEL
ALT: 20 U/L (ref 0–44)
AST: 16 U/L (ref 15–41)
Albumin: 4.2 g/dL (ref 3.5–5.0)
Alkaline Phosphatase: 45 U/L (ref 38–126)
Anion gap: 7 (ref 5–15)
BUN: 19 mg/dL (ref 8–23)
CO2: 26 mmol/L (ref 22–32)
Calcium: 9.2 mg/dL (ref 8.9–10.3)
Chloride: 101 mmol/L (ref 98–111)
Creatinine, Ser: 0.79 mg/dL (ref 0.61–1.24)
GFR calc Af Amer: >60 mL/min (ref >60)
GFR calc non Af Amer: >60 mL/min (ref >60)
Glucose, Bld: 119 mg/dL — ABNORMAL HIGH (ref 70–99)
Potassium: 4.4 mmol/L (ref 3.5–5.1)
Sodium: 134 mmol/L — ABNORMAL LOW (ref 135–145)
Total Bilirubin: 0.6 mg/dL (ref 0.3–1.2)
Total Protein: 6.8 g/dL (ref 6.5–8.1)

## 2019-06-13 ENCOUNTER — Other Ambulatory Visit: Payer: Self-pay

## 2019-06-13 ENCOUNTER — Inpatient Hospital Stay (HOSPITAL_BASED_OUTPATIENT_CLINIC_OR_DEPARTMENT_OTHER): Payer: Medicare Other | Admitting: Nurse Practitioner

## 2019-06-13 DIAGNOSIS — Z9221 Personal history of antineoplastic chemotherapy: Secondary | ICD-10-CM | POA: Diagnosis not present

## 2019-06-13 DIAGNOSIS — Z79899 Other long term (current) drug therapy: Secondary | ICD-10-CM | POA: Diagnosis not present

## 2019-06-13 DIAGNOSIS — C099 Malignant neoplasm of tonsil, unspecified: Secondary | ICD-10-CM | POA: Diagnosis not present

## 2019-06-13 DIAGNOSIS — Z923 Personal history of irradiation: Secondary | ICD-10-CM | POA: Diagnosis not present

## 2019-06-13 DIAGNOSIS — Z85818 Personal history of malignant neoplasm of other sites of lip, oral cavity, and pharynx: Secondary | ICD-10-CM | POA: Diagnosis not present

## 2019-06-13 DIAGNOSIS — Z933 Colostomy status: Secondary | ICD-10-CM | POA: Diagnosis not present

## 2019-06-13 NOTE — Progress Notes (Signed)
[place], [place] [address]   CLINIC:  Medical Oncology/Hematology  PCP:  Caren Macadam, [place] [address] [phone]   REASON FOR VISIT: Follow-up for tonsillar cancer HPV positive  CURRENT THERAPY: Observation  BRIEF ONCOLOGIC HISTORY:  Oncology History  Tonsil cancer ([place])  08/02/2015 Initial Diagnosis   Cancer of tonsillar fossa ([place])   08/20/2015 Procedure   Port-a-cath placed by IR   08/20/2015 Procedure   G-tube placed by IR   08/21/2015 - 10/09/2015 Radiation Therapy   70 Gy, Dr. Isidore Moos.   08/21/2015 - 09/10/2015 Chemotherapy   Cisplatin every 21 days with XRT.   08/28/2015 Adverse Reaction   Tinnitis, Cisplatin-induced.   09/11/2015 - 09/30/2015 Chemotherapy   Carboplatin/5FU   09/20/2015 - 09/21/2015 Hospital Admission   Intractable nausea/vomiting. Severe Mucositis   10/17/2015 - 10/20/2015 Hospital Admission   Esophageal/oral candidasis. Shingles. Fever. Chemotherapy induced pancytopenia   01/24/2016 PET scan   Resolution of the left tonsillar mass and left neck adenopathy. No findings for residual or locally recurrent tumor, adenopathy or distant metastatic disease.   03/28/2016 - 04/08/2016 Hospital Admission   Admitted with severe abdominal pain. Found to diffuse peritonitis from perforated sigmoid colon requiring colostomy. Discharged to [facility] in [place].          CANCER STAGING: Cancer Staging Tonsil cancer ([place]) Staging form: Pharynx - Oropharynx, AJCC 7th Edition - Clinical: Stage IVA (T2, N2b, M0) - Signed by Eppie Gibson, MD on 08/02/2015    INTERVAL HISTORY:  Frank Castillo 69 y.o. male returns for routine follow-up for tonsillar cancer.  Patient reports he has been doing well since his last visit.  He has no complaints at this time.  He denies any dysphagia. Denies any nausea, vomiting, or diarrhea. Denies any new pains. Had not noticed any recent bleeding such as  epistaxis, hematuria or hematochezia. Denies recent chest pain on exertion, shortness of breath on minimal exertion, pre-syncopal episodes, or palpitations. Denies any numbness or tingling in hands or feet. Denies any recent fevers, infections, or recent hospitalizations. Patient reports appetite at 100% and energy level at 75%.  He is eating well maintain his weight at this time.     REVIEW OF SYSTEMS:  Review of Systems  All other systems reviewed and are negative.    PAST MEDICAL/SURGICAL HISTORY:  Past Medical History:  Diagnosis Date  . Cancer ([place])   . Cirrhosis ([place])    Followed at [facility]  . GERD (gastroesophageal reflux disease)   . History of bowel resection   . History of tonsillectomy    Dr. Benjamine Mola, 06/2015  . Hypertension   . Iron deficiency anemia 05/07/2016  . Melanoma ([place])    followed by Dr. Cleophas Dunker  . Portal hypertension ([place])   . Substance abuse ([place])    alcohol  . Tonsillar cancer ([place]) 06/2015   SCC  . Tonsillar mass    left   Past Surgical History:  Procedure Laterality Date  . BIOPSY  07/02/2016   Procedure: BIOPSY;  Surgeon: Daneil Dolin, MD;  [facility];  Service: Endoscopy;;  esophageal  . COLON SURGERY    . COLONOSCOPY WITH PROPOFOL N/A 07/02/2016   Procedure: COLONOSCOPY WITH PROPOFOL;  Surgeon: Daneil Dolin, MD;  [facility];  Service: Endoscopy;  Laterality: N/A;  9:45am  . COLOSTOMY TAKEDOWN N/A 09/02/2016   Procedure: LAPAROSCOPIC COLOSTOMY REVERSAL;  Surgeon: Leighton Ruff, MD;  Location: [facility];  Service: General;  Laterality: N/A;  . ESOPHAGOGASTRODUODENOSCOPY (EGD) WITH PROPOFOL N/A 07/02/2016   Procedure: ESOPHAGOGASTRODUODENOSCOPY (EGD) WITH PROPOFOL;  Surgeon: Daneil Dolin, MD;  [facility];  Service: Endoscopy;  Laterality: N/A;  . HERNIA REPAIR Right   . INCISIONAL HERNIA REPAIR  09/02/2016   Procedure: HERNIA REPAIR INCISIONAL;  Surgeon: Leighton Ruff, MD;  Location: [facility];  Service: General;;    . LAPAROTOMY N/A 03/28/2016   Procedure: EXPLORATORY LAPAROTOMY, SIGMOID COLECTOMY, COLOSTOMY;  Surgeon: Leighton Ruff, MD;  [facility];  Service: General;  Laterality: N/A;  [name] DILATION N/A 07/02/2016   Procedure: Keturah Shavers;  Surgeon: Daneil Dolin, MD;  [facility];  Service: Endoscopy;  Laterality: N/A;  . MOUTH SURGERY    . PORT-A-CATH REMOVAL  09/02/2016   Procedure: REMOVAL PORT-A-CATH;  Surgeon: Leighton Ruff, MD;  [facility];  Service: General;;  . REMOVAL OF GASTROSTOMY TUBE N/A 09/02/2016   Procedure: REMOVAL OF GASTROSTOMY TUBE;  Surgeon: Leighton Ruff, MD;  [facility];  Service: General;  Laterality: N/A;  . TONSILLECTOMY Left 07/09/2015   Procedure: LEFT TONSILLECTOMY;  Surgeon: Leta Baptist, MD;  Location: [facility];  Service: ENT;  Laterality: Left;  . TONSILLECTOMY    . Z-PLASTY SCAR REVISION N/A 09/02/2016   Procedure: SCAR REVISION;  Surgeon: Leighton Ruff, MD;  [facility];  Service: General;  Laterality: N/A;     SOCIAL HISTORY:  Social History   Socioeconomic History  . Marital status: Single    Spouse name: Not on file  . Number of children: 0  . Years of education: Not on file  . Highest education level: Not on file  Occupational History  . Not on file  Tobacco Use  . Smoking status: Former Smoker    Packs/day: 0.50    Years: 30.00    Pack years: 15.00    Types: Cigarettes    Quit date: 11/22/2016    Years since quitting: 2.5  . Smokeless tobacco: Never Used  Substance and Sexual Activity  . Alcohol use: No    Alcohol/week: 0.0 standard drinks    Comment: former abuse- quit 09/2011  . Drug use: No  . Sexual activity: Not on file  Other Topics Concern  . Not on file  Social History Narrative   Divorced.   No children.   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    Has been smoking off/on since age 38,  quit smoking 11/2016.    Exercises minimally   Eats all food groups.   Wears seatbelt.   Mother lives with him, has dementia.   Attends AA meetings.    Social Determinants of Health   Financial Resource Strain:   . Difficulty of Paying Living Expenses: Not on file  Food Insecurity:   . Worried About Charity fundraiser in the Last Year: Not on file  . Ran Out of Food in the Last Year: Not on file  Transportation Needs:   . Lack of Transportation (Medical): Not on file  . Lack of Transportation (Non-Medical): Not on file  Physical Activity:   . Days of Exercise per Week: Not on file  . Minutes of Exercise per Session: Not on file  Stress:   . Feeling of Stress : Not on file  Social Connections:   . Frequency of Communication with  Friends and Family: Not on file  . Frequency of Social Gatherings with Friends and Family: Not on file  . Attends Religious Services: Not on file  . Active Member of Clubs or Organizations: Not on file  . Attends Archivist Meetings: Not on file  . Marital Status: Not on file  Intimate Partner Violence:   . Fear of Current or Ex-Partner: Not on file  . Emotionally Abused: Not on file  . Physically Abused: Not on file  . Sexually Abused: Not on file    FAMILY HISTORY:  Family History  Problem Relation Age of Onset  . Macular degeneration Mother   . Dementia Mother   . Alcoholism Father   . Diabetes Maternal Aunt   . Diabetes Maternal Uncle   . Mental illness Sister     CURRENT MEDICATIONS:  Outpatient Encounter Medications as of 06/13/2019  Medication Sig  . aspirin EC 81 MG tablet Take 1 tablet (81 mg total) by mouth daily.  Marland Kitchen atorvastatin (LIPITOR) 40 MG tablet Take 1 tablet (40 mg total) by mouth daily.  . CHANTIX STARTING MONTH PAK 0.5 MG X 11 & 1 MG X 42 tablet Take by mouth See admin instructions.   . DULoxetine (CYMBALTA) 60 MG capsule Take 120 mg by mouth daily.  Marland Kitchen lisinopril (PRINIVIL,ZESTRIL) 10 MG tablet Take 1 tablet  (10 mg total) by mouth daily.  . MULTIPLE VITAMINS-MINERALS ER PO Take 1 tablet by mouth daily.   . pantoprazole (PROTONIX) 40 MG tablet Take 1 tablet (40 mg total) by mouth daily.  . sodium fluoride (FLUORISHIELD) 1.1 % GEL dental gel Instill one drop of gel per tooth space of fluoride tray. Place over teeth for 5 minutes. Remove. Spit out excess. Repeat nightly.  . triamcinolone cream (KENALOG) 0.1 % Apply 1 application 2 (two) times daily topically.  . cetirizine (ZYRTEC) 10 MG tablet Take 10 mg by mouth as needed.   . diphenhydrAMINE (BENADRYL) 50 MG tablet Take 50 mg by mouth at bedtime as needed for sleep.  . Melatonin 10 MG TABS Take 1 tablet by mouth as needed.    Facility-Administered Encounter Medications as of 06/13/2019  Medication  . 0.9 %  sodium chloride infusion  . 0.9 %  sodium chloride infusion  . 0.9 %  sodium chloride infusion  . 0.9 %  sodium chloride infusion  . 0.9 %  sodium chloride infusion    ALLERGIES:  Allergies  Allergen Reactions  . Bee Venom Swelling  . Other Other (See Comments)    Opthalmic mycin irritates eyes.  Opthalmic mycin irritates eyes.      PHYSICAL EXAM:  ECOG Performance status: 1  Vitals:   06/13/19 0922  BP: 126/81  Pulse: 100  Resp: 18  Temp: 97.6 F (36.4 C)  SpO2: 97%   Filed Weights   06/13/19 0922  Weight: 183 lb (83 kg)    Physical Exam Constitutional:      Appearance: Normal appearance. He is normal weight.  HENT:     Mouth/Throat:     Mouth: Mucous membranes are moist.     Pharynx: Oropharynx is clear.  Cardiovascular:     Rate and Rhythm: Normal rate and regular rhythm.     Heart sounds: Normal heart sounds.  Pulmonary:     Effort: Pulmonary effort is normal.     Breath sounds: Normal breath sounds.  Abdominal:     General: Bowel sounds are normal.     Palpations: Abdomen is soft.  Musculoskeletal:        General: Normal range of motion.     Cervical back: Normal range of motion and neck supple.   Skin:    General: Skin is warm.  Neurological:     Mental Status: He is alert and oriented to person, place, and time. Mental status is at baseline.  Psychiatric:        Mood and Affect: Mood normal.        Behavior: Behavior normal.        Thought Content: Thought content normal.        Judgment: Judgment normal.      LABORATORY DATA:  I have reviewed the labs as listed.  CBC    Component Value Date/Time   WBC 7.6 06/06/2019 1234   RBC 4.81 06/06/2019 1234   HGB 13.3 06/06/2019 1234   HGB 11.5 (L) 10/21/2011 1614   HCT 42.8 06/06/2019 1234   HCT 34.6 (L) 10/21/2011 1614   PLT 317 06/06/2019 1234   PLT 209 10/21/2011 1614   MCV 89.0 06/06/2019 1234   MCV 101 (H) 10/21/2011 1614   MCH 27.7 06/06/2019 1234   MCHC 31.1 06/06/2019 1234   RDW 14.5 06/06/2019 1234   RDW 14.0 10/21/2011 1614   LYMPHSABS 1.2 06/06/2019 1234   LYMPHSABS 1.9 10/20/2011 0609   MONOABS 0.7 06/06/2019 1234   MONOABS 1.1 (H) 10/20/2011 0609   EOSABS 0.3 06/06/2019 1234   EOSABS 0.1 10/20/2011 0609   BASOSABS 0.2 (H) 06/06/2019 1234   BASOSABS 0.1 10/20/2011 0609   CMP Latest Ref Rng & Units 06/06/2019 11/28/2018 05/30/2018  Glucose 70 - 99 mg/dL 119(H) 114(H) 119(H)  BUN 8 - 23 mg/dL 19 15 12   Creatinine 0.61 - 1.24 mg/dL 0.79 0.77 0.92  Sodium 135 - 145 mmol/L 134(L) 137 137  Potassium 3.5 - 5.1 mmol/L 4.4 4.9 4.1  Chloride 98 - 111 mmol/L 101 104 107  CO2 22 - 32 mmol/L 26 26 24   Calcium 8.9 - 10.3 mg/dL 9.2 9.4 9.1  Total Protein 6.5 - 8.1 g/dL 6.8 6.8 7.1  Total Bilirubin 0.3 - 1.2 mg/dL 0.6 0.5 0.5  Alkaline Phos 38 - 126 U/L 45 41 44  AST 15 - 41 U/L 16 14(L) 18  ALT 0 - 44 U/L 20 15 20      I personally performed a face-to-face visit, made revisions and my assessment and plan is as follows.  All questions were answered to patient's stated satisfaction. Encouraged patient to call with any new concerns or questions before his next visit to the cancer center and we can certain see him  sooner, if needed.     ASSESSMENT & PLAN:   Tonsil cancer ([place]) 1.  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 -Today's physical exam did not reveal any palpable lymphadenopathy in the neck region.  No masses in the oral cavity. -Patient denies any dysphagia. -Labs done on 06/06/2019 showed potassium 4.4, creatinine 0.79, LFTs WNL, WBC 7.6, hemoglobin 13.3, platelets 317. -We will see him back in 6 months for follow-up with labs.  2.  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 Benjamine Mola, referred to Dr. Nicolette Bang who did marginal mandibulectomy with primary closure on 09/20/2017. -This showed 2.1 x 0.9 x 0.4  cm squamous cell carcinoma with mandibular bone invasion. -Today's examination in the oral cavity did not reveal any suspicious masses. -His PET CT scan on 08/24/2017 was negative for any metastatic adenopathy. -He continues to see Dr. Nicolette Bang every 4 months.  He sees Dr. Benjamine Mola every 6 months. -Patient reports he has a visit with his dentist today on 06/13/2019 for a regular oral check and teeth cleaning. -We will see him back in 6 months of repeat blood work and follow-up.      Orders placed this encounter:  Orders Placed This Encounter  Procedures  . TSH  . Magnesium  . CBC with Differential/Platelet  . Comprehensive metabolic panel  . Lactate dehydrogenase  . Vitamin B12  . VITAMIN D 25 Hydroxy (Vit-D Deficiency, Fractures)    I provided 25 minutes of during this encounter, and > 50% was spent counseling as documented under my assessment & plan.  [name], FNP-C [place] [phone]

## 2019-06-13 NOTE — Patient Instructions (Signed)
[facility] at [facility] Discharge Instructions  Follow up in 6 months with labs    Thank you for choosing [facility] at [facility] to provide your oncology and hematology care.  To afford each patient quality time with our provider, please arrive at least 15 minutes before your scheduled appointment time.   If you have a lab appointment with the Cancer Center please come in thru the Main Entrance and check in at the main information desk.  You need to re-schedule your appointment should you arrive 10 or more minutes late.  We strive to give you quality time with our providers, and arriving late affects you and other patients whose appointments are after yours.  Also, if you no show three or more times for appointments you may be dismissed from the clinic at the providers discretion.     Again, thank you for choosing [facility].  Our hope is that these requests will decrease the amount of time that you wait before being seen by our physicians.       _____________________________________________________________  Should you have questions after your visit to [facility], please contact our office at [phone] between the hours of 8:00 a.m. and 4:30 p.m.  Voicemails left after 4:00 p.m. will not be returned until the following business day.  For prescription refill requests, have your pharmacy contact our office and allow 72 hours.    Due to Covid, you will need to wear a mask upon entering the hospital. If you do not have a mask, a mask will be given to you at the Main Entrance upon arrival. For doctor visits, patients may have 1 support person with them. For treatment visits, patients can not have anyone with them due to social distancing guidelines and our immunocompromised population.      

## 2019-06-13 NOTE — Assessment & Plan Note (Addendum)
1.  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 -Today's physical exam did not reveal any palpable lymphadenopathy in the neck region.  No masses in the oral cavity. -Patient denies any dysphagia. -Labs done on 06/06/2019 showed potassium 4.4, creatinine 0.79, LFTs WNL, WBC 7.6, hemoglobin 13.3, platelets 317. -We will see him back in 6 months for follow-up with labs.  2.  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 Benjamine Mola, referred to Dr. Nicolette Bang who did marginal mandibulectomy with primary closure on 09/20/2017. -This showed 2.1 x 0.9 x 0.4 cm squamous cell carcinoma with mandibular bone invasion. -Today's examination in the oral cavity did not reveal any suspicious masses. -His PET CT scan on 08/24/2017 was negative for any metastatic adenopathy. -He continues to see Dr. Nicolette Bang every 4 months.  He sees Dr. Benjamine Mola every 6 months. -Patient reports he has a visit with his dentist today on 06/13/2019 for a regular oral check and teeth cleaning. -We will see him back in 6 months of repeat blood work and follow-up.

## 2019-06-25 ENCOUNTER — Other Ambulatory Visit: Payer: Self-pay | Admitting: Internal Medicine

## 2019-06-27 DIAGNOSIS — Z85828 Personal history of other malignant neoplasm of skin: Secondary | ICD-10-CM | POA: Diagnosis not present

## 2019-06-27 DIAGNOSIS — L111 Transient acantholytic dermatosis [Grover]: Secondary | ICD-10-CM | POA: Diagnosis not present

## 2019-06-30 NOTE — Telephone Encounter (Signed)
Can we clarify if this prescription for Twinrix is needed? Typically we write for this once and the pharmacy completes the series.

## 2019-07-03 NOTE — Telephone Encounter (Signed)
Noted  

## 2019-07-03 NOTE — Telephone Encounter (Signed)
It was an error on the pharmacy's part.

## 2019-07-04 DIAGNOSIS — D3709 Neoplasm of uncertain behavior of other specified sites of the oral cavity: Secondary | ICD-10-CM | POA: Diagnosis not present

## 2019-07-11 ENCOUNTER — Telehealth: Payer: Self-pay | Admitting: Internal Medicine

## 2019-07-11 ENCOUNTER — Other Ambulatory Visit: Payer: Self-pay

## 2019-07-11 MED ORDER — DEXILANT 60 MG PO CPDR: 60.0000 mg | DELAYED_RELEASE_CAPSULE | Freq: Every day | ORAL | 2 refills | Status: DC

## 2019-07-11 NOTE — Telephone Encounter (Signed)
Spoke with pt. He has been taking Pantoprazole 40 mg for 1 year and feels it's not controlling his Jerrye Bushy. Pt would like to switch back to Dexilant 60 mg. Pt has met his deductible and is able to pay the cost of medication. Please advise.

## 2019-07-11 NOTE — Telephone Encounter (Signed)
Noted. Pt is aware that he needs to schedule a f/u apt for additional refills. Pt will call back to make apt when he has his schedule. Dexilant 60 mg sent to pharmacy.

## 2019-07-11 NOTE — Telephone Encounter (Signed)
Okay.  Dexilant 60 mg capsule 1 daily.  Dispense 30 with 2 refills.  Needs an office visit in the next 2 to 3 months (prior to any further refills)

## 2019-07-11 NOTE — Telephone Encounter (Signed)
Pt asked if RMR would put him back on Dexilant. He uses [facility]. [phone]

## 2019-07-20 DIAGNOSIS — F321 Major depressive disorder, single episode, moderate: Secondary | ICD-10-CM | POA: Diagnosis not present

## 2019-07-20 DIAGNOSIS — I10 Essential (primary) hypertension: Secondary | ICD-10-CM | POA: Diagnosis not present

## 2019-07-20 DIAGNOSIS — D3709 Neoplasm of uncertain behavior of other specified sites of the oral cavity: Secondary | ICD-10-CM | POA: Diagnosis not present

## 2019-07-20 DIAGNOSIS — E782 Mixed hyperlipidemia: Secondary | ICD-10-CM | POA: Diagnosis not present

## 2019-07-20 DIAGNOSIS — F1911 Other psychoactive substance abuse, in remission: Secondary | ICD-10-CM | POA: Diagnosis not present

## 2019-07-20 DIAGNOSIS — Z716 Tobacco abuse counseling: Secondary | ICD-10-CM | POA: Diagnosis not present

## 2019-07-27 ENCOUNTER — Ambulatory Visit: Payer: Self-pay

## 2019-07-28 ENCOUNTER — Ambulatory Visit: Payer: Medicare Other | Attending: Internal Medicine

## 2019-07-28 DIAGNOSIS — Z23 Encounter for immunization: Secondary | ICD-10-CM | POA: Insufficient documentation

## 2019-07-28 NOTE — Progress Notes (Signed)
   Covid-19 Vaccination Clinic  Name:  Frank Castillo    MRN: [id number] DOB: [birth year]  07/28/2019  [name] was observed post Covid-19 immunization for 15 minutes without incident. He was provided with Vaccine Information Sheet and instruction to access the V-Safe system.   [name] was instructed to call 911 with any severe reactions post vaccine: Marland Kitchen Difficulty breathing  . Swelling of face and throat  . A fast heartbeat  . A bad rash all over body  . Dizziness and weakness   Immunizations Administered    Name Date Dose VIS Date Route   Moderna COVID-19 Vaccine 07/28/2019  9:53 AM 0.5 mL 04/25/2019 Intramuscular   Manufacturer: Moderna   Lot: [id number]   [id number]

## 2019-08-15 ENCOUNTER — Other Ambulatory Visit: Payer: Self-pay

## 2019-08-15 ENCOUNTER — Encounter: Payer: Self-pay | Admitting: *Deleted

## 2019-08-15 ENCOUNTER — Ambulatory Visit (INDEPENDENT_AMBULATORY_CARE_PROVIDER_SITE_OTHER): Payer: Medicare Other | Admitting: Internal Medicine

## 2019-08-15 ENCOUNTER — Encounter: Payer: Self-pay | Admitting: Internal Medicine

## 2019-08-15 ENCOUNTER — Other Ambulatory Visit: Payer: Self-pay | Admitting: *Deleted

## 2019-08-15 VITALS — BP 131/81 | HR 106 | Temp 96.8°F | Ht 69.0 in | Wt 188.0 lb

## 2019-08-15 DIAGNOSIS — K7030 Alcoholic cirrhosis of liver without ascites: Secondary | ICD-10-CM | POA: Diagnosis not present

## 2019-08-15 DIAGNOSIS — K2100 Gastro-esophageal reflux disease with esophagitis, without bleeding: Secondary | ICD-10-CM

## 2019-08-15 MED ORDER — DEXLANSOPRAZOLE 60 MG PO CPDR: 60.0000 mg | DELAYED_RELEASE_CAPSULE | Freq: Every day | ORAL | 11 refills | Status: DC

## 2019-08-15 NOTE — Progress Notes (Signed)
Primary Care Physician:  Caren Macadam, MD Primary Gastroenterologist:  Dr. Gala Romney  Pre-Procedure History & Physical: HPI:  Frank Castillo is a 69 y.o. male here for follow-up of EtOH related cirrhosis.  He is done well.  He has had history of tonsillar and gingival carcinoma  -  status post XRT bone excision. Received his hepatitis A and B vaccine since he was last here.  He is followed by [facility] hepatology.  He seen there in the fall annually.  He had comprehensive metabolic profile and CBC back in January but no INR.  His sodium slightly depressed at 134 bilirubin and creatinine were normal.  Platelet count also good at 317,000. Last EGD 2018 has some changes consistent with portal gastropathy but no esophageal varices.  Had a colonoscopy at that time (via colostomy and examination Hartman's pouch) prior to reanastomosis.  Hx of emergency sigmoid resection secondary to idiopathic perforation back in 2017.  Had a abdominal wall hernia repair 2018.  He has asymptomatic cholelithiasis.  No recent screening liver imaging. GERD suboptimally controlled on omeprazole.  Patient has gone back on Dexilant 60 mg daily with excellent control of his reflux symptoms.  No dysphagia.   Past Medical History:  Diagnosis Date  . Cancer ([place])   . Cirrhosis ([place])    Followed at [facility]; completed Hep A and B vaccine 05/2019  . GERD (gastroesophageal reflux disease)   . History of bowel resection   . History of tonsillectomy    Dr. Benjamine Mola, 06/2015  . Hypertension   . Iron deficiency anemia 05/07/2016  . Melanoma ([place])    followed by Dr. Cleophas Dunker  . Portal hypertension ([place])   . Substance abuse ([place])    alcohol  . 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 Tonsillar mass    left    Past Surgical History:  Procedure Laterality Date  . BIOPSY  07/02/2016   Procedure: BIOPSY;  Surgeon: Daneil Dolin, MD;  [facility];  Service: Endoscopy;;  esophageal  . COLON SURGERY    . COLONOSCOPY WITH  PROPOFOL N/A 07/02/2016   Procedure: COLONOSCOPY WITH PROPOFOL;  Surgeon: Daneil Dolin, MD;  [facility];  Service: Endoscopy;  Laterality: N/A;  9:45am  . COLOSTOMY TAKEDOWN N/A 09/02/2016   Procedure: LAPAROSCOPIC COLOSTOMY REVERSAL;  Surgeon: Leighton Ruff, MD;  Location: [facility];  Service: General;  Laterality: N/A;  . ESOPHAGOGASTRODUODENOSCOPY (EGD) WITH PROPOFOL N/A 07/02/2016   Procedure: ESOPHAGOGASTRODUODENOSCOPY (EGD) WITH PROPOFOL;  Surgeon: Daneil Dolin, MD;  [facility];  Service: Endoscopy;  Laterality: N/A;  . HERNIA REPAIR Right   . INCISIONAL HERNIA REPAIR  09/02/2016   Procedure: HERNIA REPAIR INCISIONAL;  Surgeon: Leighton Ruff, MD;  [facility];  Service: General;;  . LAPAROTOMY N/A 03/28/2016   Procedure: EXPLORATORY LAPAROTOMY, SIGMOID COLECTOMY, COLOSTOMY;  Surgeon: Leighton Ruff, MD;  [facility];  Service: General;  Laterality: N/A;  [name] DILATION N/A 07/02/2016   Procedure: Keturah Shavers;  Surgeon: Daneil Dolin, MD;  [facility];  Service: Endoscopy;  Laterality: N/A;  . MOUTH SURGERY    . PORT-A-CATH REMOVAL  09/02/2016   Procedure: REMOVAL PORT-A-CATH;  Surgeon: Leighton Ruff, MD;  [facility];  Service: General;;  . REMOVAL OF GASTROSTOMY TUBE N/A 09/02/2016   Procedure: REMOVAL OF GASTROSTOMY TUBE;  Surgeon: Leighton Ruff, MD;  [facility];  Service: General;  Laterality: N/A;  . TONSILLECTOMY Left 07/09/2015   Procedure:  LEFT TONSILLECTOMY;  Surgeon: Leta Baptist, MD;  Location: [place];  Service: ENT;  Laterality: Left;  . TONSILLECTOMY    . Z-PLASTY SCAR REVISION N/A 09/02/2016   Procedure: SCAR REVISION;  Surgeon: Leighton Ruff, MD;  [facility];  Service: General;  Laterality: N/A;    Prior to Admission medications   Medication Sig Start Date End Date Taking? Authorizing Provider  aspirin EC 81 MG tablet Take 1 tablet (81 mg total) by mouth daily. 06/17/17  Yes Caren Macadam, MD    atorvastatin (LIPITOR) 40 MG tablet Take 1 tablet (40 mg total) by mouth daily. 11/29/17  Yes Hagler, [name], MD  cetirizine (ZYRTEC) 10 MG tablet Take 10 mg by mouth as needed.    Yes [provider]  CHANTIX STARTING MONTH PAK 0.5 MG X 11 & 1 MG X 42 tablet Take by mouth See admin instructions.  11/24/18  Yes [provider]  dexlansoprazole (DEXILANT) 60 MG capsule Take 1 capsule (60 mg total) by mouth daily. 07/11/19  Yes Srihan Brutus, Cristopher Estimable, MD  diphenhydrAMINE (BENADRYL) 50 MG tablet Take 50 mg by mouth at bedtime as needed for sleep.   Yes [provider]  DULoxetine (CYMBALTA) 60 MG capsule Take 120 mg by mouth daily. 11/03/18  Yes [provider]  lisinopril (PRINIVIL,ZESTRIL) 10 MG tablet Take 1 tablet (10 mg total) by mouth daily. 05/20/17  Yes Hagler, [name], MD  Melatonin 10 MG TABS Take 1 tablet by mouth as needed.    Yes [provider]  MULTIPLE VITAMINS-MINERALS ER PO Take 1 tablet by mouth daily.    Yes [provider]  sodium fluoride (FLUORISHIELD) 1.1 % GEL dental gel Instill one drop of gel per tooth space of fluoride tray. Place over teeth for 5 minutes. Remove. Spit out excess. Repeat nightly. 03/16/17  Yes Hagler, [name], MD  triamcinolone cream (KENALOG) 0.1 % Apply 1 application 2 (two) times daily topically. 03/25/17  Yes [provider]    Allergies as of 08/15/2019 - Review Complete 08/15/2019  Allergen Reaction Noted  . Bee venom Swelling 07/31/2015  . Other Other (See Comments) 11/28/2015    Family History  Problem Relation Age of Onset  . Macular degeneration Mother   . Dementia Mother   . Alcoholism Father   . Diabetes Maternal Aunt   . Diabetes Maternal Uncle   . Mental illness Sister     Social History   Socioeconomic History  . Marital status: Single    Spouse name: Not on file  . Number of children: 0  . Years of education: Not on file  . Highest education level: Not on file  Occupational  History  . Not on file  Tobacco Use  . Smoking status: Current Some Day Smoker    Packs/day: 0.50    Years: 30.00    Pack years: 15.00    Types: Cigarettes    Last attempt to quit: 11/22/2016    Years since quitting: 2.7  . Smokeless tobacco: Never Used  Substance and Sexual Activity  . Alcohol use: No    Alcohol/week: 0.0 standard drinks    Comment: former abuse- quit 09/2011  . Drug use: No  . Sexual activity: Not on file  Other Topics Concern  . Not on file  Social History Narrative   Divorced.   No children.   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   Worked in Tree [employer].    Fully retired  at this time.    Has been smoking off/on since age 70, quit smoking 11/2016.    Exercises minimally   Eats all food groups.   Wears seatbelt.   Mother lives with him, has dementia.   Attends AA meetings.    Social Determinants of Health   Financial Resource Strain:   . Difficulty of Paying Living Expenses:   Food Insecurity:   . Worried About Charity fundraiser in the Last Year:   . Arboriculturist in the Last Year:   Transportation Needs:   . Film/video editor (Medical):   Marland Kitchen Lack of Transportation (Non-Medical):   Physical Activity:   . Days of Exercise per Week:   . Minutes of Exercise per Session:   Stress:   . Feeling of Stress :   Social Connections:   . Frequency of Communication with Friends and Family:   . Frequency of Social Gatherings with Friends and Family:   . Attends Religious Services:   . Active Member of Clubs or Organizations:   . Attends Archivist Meetings:   Marland Kitchen Marital Status:   Intimate Partner Violence:   . Fear of Current or Ex-Partner:   . Emotionally Abused:   Marland Kitchen Physically Abused:   . Sexually Abused:     Review of Systems: See HPI, otherwise negative ROS  Physical Exam: BP 131/81   Pulse (!) 106   Temp (!) 96.8 F (36 C) (Temporal)   Ht 5\' 9"  (1.753 m)   Wt 188 lb (85.3 kg)   BMI  27.76 kg/m  General:   Alert,  Well-developed, well-nourished, pleasant and cooperative in NAD Skin:    No stigmata of chronic liver disease Neck:  Supple; no masses or thyromegaly. No significant cervical adenopathy. Lungs:  Clear throughout to auscultation.   No wheezes, crackles, or rhonchi. No acute distress. Heart:  Regular rate and rhythm; no murmurs, clicks, rubs,  or gallops. Abdomen: Nondistended.  Well-healed surgical scars.  Non-distended, normal bowel sounds.  Soft and nontender without appreciable mass or hepatosplenomegaly.  Evidence of recurrent hernia. Pulses:  Normal pulses noted. Extremities:  Without clubbing or edema.  Impression/Plan: 69 year old gentleman with a EtOH cirrhosis remains very well compensated.  Sober for nearly a decade now.  History of idiopathic colonic perforation  -  status post sigmoid resection with subsequent takedown.  Labs incomplete for MELD calculating from back in January.  I suspect you will have a low meld.  He will need to have a screening EGD this year.  He has been vaccinated against hepatitis A and B .   I feel he has very well-preserved hepatic synthetic function.  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 He was commended on long-term sobriety.  Recommendations:  Schedule a hepatic ultrasound - screening / cirrhosis  CMP /INR just before OV June 2021  Plan for a screening EGD just after next OV  Continue Dexilant 60 mg daily (disp 30 with 11 refills)  Colonoscopy 2028     Notice: This dictation was prepared with Dragon dictation along with smaller phrase technology. Any transcriptional errors that result from this process are unintentional and may not be corrected upon review.

## 2019-08-15 NOTE — Patient Instructions (Addendum)
Schedule a hepatic ultrasound - screening / cirrhosis  CMP /INR just before OV June 2021  Plan for a screening EGD just after next OV  Continue Dexilant 60 mg daily (disp 30 with 11 refills)  Colonoscopy 2028

## 2019-08-22 DIAGNOSIS — Z9221 Personal history of antineoplastic chemotherapy: Secondary | ICD-10-CM | POA: Diagnosis not present

## 2019-08-22 DIAGNOSIS — Z9889 Other specified postprocedural states: Secondary | ICD-10-CM | POA: Diagnosis not present

## 2019-08-22 DIAGNOSIS — Z85818 Personal history of malignant neoplasm of other sites of lip, oral cavity, and pharynx: Secondary | ICD-10-CM | POA: Diagnosis not present

## 2019-08-22 DIAGNOSIS — C039 Malignant neoplasm of gum, unspecified: Secondary | ICD-10-CM | POA: Diagnosis not present

## 2019-08-22 DIAGNOSIS — Z87891 Personal history of nicotine dependence: Secondary | ICD-10-CM | POA: Diagnosis not present

## 2019-08-29 ENCOUNTER — Ambulatory Visit: Payer: Self-pay

## 2019-08-29 ENCOUNTER — Ambulatory Visit (HOSPITAL_COMMUNITY): Payer: Medicare Other

## 2019-08-30 ENCOUNTER — Ambulatory Visit: Payer: Self-pay

## 2019-09-05 ENCOUNTER — Other Ambulatory Visit: Payer: Self-pay

## 2019-09-05 ENCOUNTER — Ambulatory Visit (HOSPITAL_COMMUNITY)
Admission: RE | Admit: 2019-09-05 | Discharge: 2019-09-05 | Disposition: A | Discharge status: Home / Self Care | Payer: Medicare Other | Source: Ambulatory Visit | Attending: Internal Medicine | Admitting: Internal Medicine

## 2019-09-05 DIAGNOSIS — K7030 Alcoholic cirrhosis of liver without ascites: Secondary | ICD-10-CM

## 2019-09-05 DIAGNOSIS — K7689 Other specified diseases of liver: Secondary | ICD-10-CM | POA: Diagnosis not present

## 2019-09-06 ENCOUNTER — Ambulatory Visit: Payer: Medicare Other | Attending: Internal Medicine

## 2019-09-06 DIAGNOSIS — Z23 Encounter for immunization: Secondary | ICD-10-CM

## 2019-09-06 NOTE — Progress Notes (Signed)
   Covid-19 Vaccination Clinic  Name:  Frank Castillo    MRN: [id number] DOB: 08-Sep-1950  09/06/2019  [name] was observed post Covid-19 immunization for 15 minutes without incident. He was provided with Vaccine Information Sheet and instruction to access the V-Safe system.   [name] was instructed to call 911 with any severe reactions post vaccine: Marland Kitchen Difficulty breathing  . Swelling of face and throat  . A fast heartbeat  . A bad rash all over body  . Dizziness and weakness   Immunizations Administered    Name Date Dose VIS Date Route   Moderna COVID-19 Vaccine 09/06/2019 10:28 AM 0.5 mL 04/25/2019 Intramuscular   Manufacturer: Levan Hurst   Lot: [id number]   [id number]      Covid-19 Vaccination Clinic  Name:  Frank Castillo    MRN: [id number] DOB: 1950-11-25  09/06/2019  [name] was observed post Covid-19 immunization for 15 minutes without incident. He was provided with Vaccine Information Sheet and instruction to access the V-Safe system.   [name] was instructed to call 911 with any severe reactions post vaccine: Marland Kitchen Difficulty breathing  . Swelling of face and throat  . A fast heartbeat  . A bad rash all over body  . Dizziness and weakness   Immunizations Administered    Name Date Dose VIS Date Route   Moderna COVID-19 Vaccine 09/06/2019 10:28 AM 0.5 mL 04/25/2019 Intramuscular   Manufacturer: Moderna   Lot: [id number]   [id number]

## 2019-09-26 ENCOUNTER — Other Ambulatory Visit: Payer: Self-pay | Admitting: Emergency Medicine

## 2019-09-26 ENCOUNTER — Encounter: Payer: Self-pay | Admitting: Emergency Medicine

## 2019-09-26 DIAGNOSIS — K7030 Alcoholic cirrhosis of liver without ascites: Secondary | ICD-10-CM

## 2019-10-10 DIAGNOSIS — M9903 Segmental and somatic dysfunction of lumbar region: Secondary | ICD-10-CM | POA: Diagnosis not present

## 2019-10-10 DIAGNOSIS — S338XXA Sprain of other parts of lumbar spine and pelvis, initial encounter: Secondary | ICD-10-CM | POA: Diagnosis not present

## 2019-10-10 DIAGNOSIS — S233XXA Sprain of ligaments of thoracic spine, initial encounter: Secondary | ICD-10-CM | POA: Diagnosis not present

## 2019-10-10 DIAGNOSIS — S134XXA Sprain of ligaments of cervical spine, initial encounter: Secondary | ICD-10-CM | POA: Diagnosis not present

## 2019-10-10 DIAGNOSIS — M9901 Segmental and somatic dysfunction of cervical region: Secondary | ICD-10-CM | POA: Diagnosis not present

## 2019-10-10 DIAGNOSIS — M9902 Segmental and somatic dysfunction of thoracic region: Secondary | ICD-10-CM | POA: Diagnosis not present

## 2019-10-10 NOTE — Progress Notes (Signed)
Labs are due on 10/27/19. Letter was mailed to pt.

## 2019-10-11 DIAGNOSIS — Z6826 Body mass index (BMI) 26.0-26.9, adult: Secondary | ICD-10-CM | POA: Diagnosis not present

## 2019-10-11 DIAGNOSIS — C099 Malignant neoplasm of tonsil, unspecified: Secondary | ICD-10-CM | POA: Diagnosis not present

## 2019-10-12 DIAGNOSIS — S338XXA Sprain of other parts of lumbar spine and pelvis, initial encounter: Secondary | ICD-10-CM | POA: Diagnosis not present

## 2019-10-12 DIAGNOSIS — M9902 Segmental and somatic dysfunction of thoracic region: Secondary | ICD-10-CM | POA: Diagnosis not present

## 2019-10-12 DIAGNOSIS — M9901 Segmental and somatic dysfunction of cervical region: Secondary | ICD-10-CM | POA: Diagnosis not present

## 2019-10-12 DIAGNOSIS — M9903 Segmental and somatic dysfunction of lumbar region: Secondary | ICD-10-CM | POA: Diagnosis not present

## 2019-10-12 DIAGNOSIS — S233XXA Sprain of ligaments of thoracic spine, initial encounter: Secondary | ICD-10-CM | POA: Diagnosis not present

## 2019-10-12 DIAGNOSIS — S134XXA Sprain of ligaments of cervical spine, initial encounter: Secondary | ICD-10-CM | POA: Diagnosis not present

## 2019-10-16 DIAGNOSIS — M9903 Segmental and somatic dysfunction of lumbar region: Secondary | ICD-10-CM | POA: Diagnosis not present

## 2019-10-16 DIAGNOSIS — S338XXA Sprain of other parts of lumbar spine and pelvis, initial encounter: Secondary | ICD-10-CM | POA: Diagnosis not present

## 2019-10-16 DIAGNOSIS — S233XXA Sprain of ligaments of thoracic spine, initial encounter: Secondary | ICD-10-CM | POA: Diagnosis not present

## 2019-10-16 DIAGNOSIS — M9902 Segmental and somatic dysfunction of thoracic region: Secondary | ICD-10-CM | POA: Diagnosis not present

## 2019-10-16 DIAGNOSIS — S134XXA Sprain of ligaments of cervical spine, initial encounter: Secondary | ICD-10-CM | POA: Diagnosis not present

## 2019-10-16 DIAGNOSIS — M9901 Segmental and somatic dysfunction of cervical region: Secondary | ICD-10-CM | POA: Diagnosis not present

## 2019-10-17 DIAGNOSIS — Z0000 Encounter for general adult medical examination without abnormal findings: Secondary | ICD-10-CM | POA: Diagnosis not present

## 2019-10-17 DIAGNOSIS — Z122 Encounter for screening for malignant neoplasm of respiratory organs: Secondary | ICD-10-CM | POA: Diagnosis not present

## 2019-10-17 DIAGNOSIS — Z716 Tobacco abuse counseling: Secondary | ICD-10-CM | POA: Diagnosis not present

## 2019-10-17 DIAGNOSIS — I10 Essential (primary) hypertension: Secondary | ICD-10-CM | POA: Diagnosis not present

## 2019-10-17 DIAGNOSIS — Z136 Encounter for screening for cardiovascular disorders: Secondary | ICD-10-CM | POA: Diagnosis not present

## 2019-10-17 DIAGNOSIS — F321 Major depressive disorder, single episode, moderate: Secondary | ICD-10-CM | POA: Diagnosis not present

## 2019-10-17 DIAGNOSIS — Z125 Encounter for screening for malignant neoplasm of prostate: Secondary | ICD-10-CM | POA: Diagnosis not present

## 2019-10-17 DIAGNOSIS — F1911 Other psychoactive substance abuse, in remission: Secondary | ICD-10-CM | POA: Diagnosis not present

## 2019-10-17 DIAGNOSIS — E782 Mixed hyperlipidemia: Secondary | ICD-10-CM | POA: Diagnosis not present

## 2019-10-17 DIAGNOSIS — K7030 Alcoholic cirrhosis of liver without ascites: Secondary | ICD-10-CM | POA: Diagnosis not present

## 2019-10-18 ENCOUNTER — Other Ambulatory Visit: Payer: Self-pay | Admitting: Family Medicine

## 2019-10-18 ENCOUNTER — Other Ambulatory Visit (HOSPITAL_COMMUNITY): Payer: Self-pay | Admitting: Family Medicine

## 2019-10-18 DIAGNOSIS — F17200 Nicotine dependence, unspecified, uncomplicated: Secondary | ICD-10-CM

## 2019-10-18 DIAGNOSIS — Z122 Encounter for screening for malignant neoplasm of respiratory organs: Secondary | ICD-10-CM

## 2019-10-19 DIAGNOSIS — R070 Pain in throat: Secondary | ICD-10-CM | POA: Diagnosis not present

## 2019-10-19 DIAGNOSIS — K047 Periapical abscess without sinus: Secondary | ICD-10-CM | POA: Diagnosis not present

## 2019-10-24 DIAGNOSIS — S233XXA Sprain of ligaments of thoracic spine, initial encounter: Secondary | ICD-10-CM | POA: Diagnosis not present

## 2019-10-24 DIAGNOSIS — M9901 Segmental and somatic dysfunction of cervical region: Secondary | ICD-10-CM | POA: Diagnosis not present

## 2019-10-24 DIAGNOSIS — M9903 Segmental and somatic dysfunction of lumbar region: Secondary | ICD-10-CM | POA: Diagnosis not present

## 2019-10-24 DIAGNOSIS — S338XXA Sprain of other parts of lumbar spine and pelvis, initial encounter: Secondary | ICD-10-CM | POA: Diagnosis not present

## 2019-10-24 DIAGNOSIS — S134XXA Sprain of ligaments of cervical spine, initial encounter: Secondary | ICD-10-CM | POA: Diagnosis not present

## 2019-10-24 DIAGNOSIS — M9902 Segmental and somatic dysfunction of thoracic region: Secondary | ICD-10-CM | POA: Diagnosis not present

## 2019-10-26 ENCOUNTER — Telehealth: Payer: Self-pay | Admitting: Internal Medicine

## 2019-10-26 NOTE — Telephone Encounter (Signed)
[phone] PATIENT CALLED AND SAID HE WAS DUE FOR LABS FROM HERE...HAD THEM DONE AT HIS PCP OFFICE LAST WEEK AND THEY WERE SUPPOSED TO SEND THE RESULTS HERE.  PLEASE CALL HIM IF THEY DID NOT SEND THEM AND IF HE NEEDS TO GO HAVE THEM DONE AGAIN

## 2019-10-26 NOTE — Telephone Encounter (Signed)
Received labs from PCP and he did have CMP and INR completed.  Will place in RMR's office for review.

## 2019-10-26 NOTE — Telephone Encounter (Signed)
Communication noted.  Thanks. 

## 2019-10-26 NOTE — Telephone Encounter (Signed)
Called pt back and he said that he had labs done last Tuesday about a week ago.  I checked to see if we received blood work results and did not find them.  Called Dr. Roma Kayser office and [facility] requesting a copy to be faxed to [place].

## 2019-10-27 ENCOUNTER — Other Ambulatory Visit (HOSPITAL_COMMUNITY): Payer: Self-pay | Admitting: Family Medicine

## 2019-10-27 ENCOUNTER — Other Ambulatory Visit: Payer: Self-pay | Admitting: Family Medicine

## 2019-10-27 DIAGNOSIS — Z122 Encounter for screening for malignant neoplasm of respiratory organs: Secondary | ICD-10-CM

## 2019-10-27 DIAGNOSIS — F17200 Nicotine dependence, unspecified, uncomplicated: Secondary | ICD-10-CM

## 2019-10-30 DIAGNOSIS — R070 Pain in throat: Secondary | ICD-10-CM | POA: Diagnosis not present

## 2019-11-07 DIAGNOSIS — M9901 Segmental and somatic dysfunction of cervical region: Secondary | ICD-10-CM | POA: Diagnosis not present

## 2019-11-07 DIAGNOSIS — M9902 Segmental and somatic dysfunction of thoracic region: Secondary | ICD-10-CM | POA: Diagnosis not present

## 2019-11-07 DIAGNOSIS — S338XXA Sprain of other parts of lumbar spine and pelvis, initial encounter: Secondary | ICD-10-CM | POA: Diagnosis not present

## 2019-11-07 DIAGNOSIS — S233XXA Sprain of ligaments of thoracic spine, initial encounter: Secondary | ICD-10-CM | POA: Diagnosis not present

## 2019-11-07 DIAGNOSIS — M9903 Segmental and somatic dysfunction of lumbar region: Secondary | ICD-10-CM | POA: Diagnosis not present

## 2019-11-07 DIAGNOSIS — S134XXA Sprain of ligaments of cervical spine, initial encounter: Secondary | ICD-10-CM | POA: Diagnosis not present

## 2019-11-15 ENCOUNTER — Encounter: Payer: Self-pay | Admitting: Gastroenterology

## 2019-11-15 ENCOUNTER — Telehealth: Payer: Self-pay | Admitting: Gastroenterology

## 2019-11-15 ENCOUNTER — Other Ambulatory Visit (HOSPITAL_COMMUNITY): Payer: Self-pay | Admitting: Family Medicine

## 2019-11-15 ENCOUNTER — Other Ambulatory Visit: Payer: Self-pay | Admitting: Family Medicine

## 2019-11-15 ENCOUNTER — Other Ambulatory Visit: Payer: Self-pay

## 2019-11-15 ENCOUNTER — Ambulatory Visit (INDEPENDENT_AMBULATORY_CARE_PROVIDER_SITE_OTHER): Payer: Medicare Other | Admitting: Gastroenterology

## 2019-11-15 VITALS — BP 102/66 | HR 137 | Temp 97.0°F | Ht 69.0 in | Wt 179.0 lb

## 2019-11-15 DIAGNOSIS — Z122 Encounter for screening for malignant neoplasm of respiratory organs: Secondary | ICD-10-CM

## 2019-11-15 DIAGNOSIS — K7030 Alcoholic cirrhosis of liver without ascites: Secondary | ICD-10-CM

## 2019-11-15 DIAGNOSIS — Z136 Encounter for screening for cardiovascular disorders: Secondary | ICD-10-CM

## 2019-11-15 NOTE — Telephone Encounter (Signed)
Patient requests his last ruq u/s in 08/2019 and upcoming EGD to be sent to [facility]).  Please also send copy of my ov note.

## 2019-11-15 NOTE — Progress Notes (Signed)
Primary Care Physician:  Caren Macadam, MD  Primary Gastroenterologist:  Garfield Cornea, MD   Chief Complaint  Patient presents with  . Cirrhosis    f/u. Due for screening EGD    HPI:  Frank Castillo is a 69 y.o. male here for follow-up of alcohol related cirrhosis (sober since 2013).  Last seen in March 2021.  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  He is also followed by [facility] hepatology annually in the fall, completed hepatitis A and B vaccinations.  Last EGD in 2018 he had some changes consistent with portal gastropathy but no esophageal varices.  Also noted to have moderately severe esophagitis from accommodation of radiation and reflux.  Esophagus dilated history of dysphagia.  He had a colonoscopy at that same time (via colostomy and examination of Hartman's pouch) prior to reanastomosis.  He has some residual diverticulosis but otherwise normal exam.  Next colonoscopy in 10 years. History of emergency sigmoid resection secondary to idiopathic perforation back in 2017.  For chronic GERD he is on Dexilant 60 mg daily.  He has a history of asymptomatic cholelithiasis.  For cirrhosis, right upper quadrant ultrasound is up-to-date as of April 2021.  Cholelithiasis noted.  No findings to suggest acute cholecystitis.  Increased hepatic echogenicity as can be seen with hepatic steatosis.  Labs from Oct 17, 2019: BUN 20, creatinine 0.86, sodium 137, potassium 4.7, albumin 4.7, total bilirubin 0.5, alk phos 44, AST 18, ALT 23, INR 1. MELD Na of 6.  Clinically feeling well.  Denies any abdominal pain.  Appetite is good.  No lower extremity edema.  No moments of confusion.  His 58 year old mother lives with him sometimes he has to get up in the middle of the night and will have some fatigue the next day.  Bowel movements regular.  No blood in the stool or melena.  Heartburn is well controlled.  No dysphagia.  Weight is stable.  Wt Readings from Last 3  Encounters:  11/15/19 179 lb (81.2 kg)  08/15/19 188 lb (85.3 kg)  06/13/19 183 lb (83 kg)       Current Outpatient Medications  Medication Sig Dispense Refill  . aspirin EC 81 MG tablet Take 1 tablet (81 mg total) by mouth daily.    Marland Kitchen atorvastatin (LIPITOR) 40 MG tablet Take 1 tablet (40 mg total) by mouth daily. 90 tablet 0  . cetirizine (ZYRTEC) 10 MG tablet Take 10 mg by mouth as needed.     . CHANTIX STARTING MONTH PAK 0.5 MG X 11 & 1 MG X 42 tablet Take by mouth See admin instructions.     Marland Kitchen dexlansoprazole (DEXILANT) 60 MG capsule Take 1 capsule (60 mg total) by mouth daily. 30 capsule 2  . diphenhydrAMINE (BENADRYL) 50 MG tablet Take 50 mg by mouth at bedtime as needed for sleep.    . DULoxetine (CYMBALTA) 60 MG capsule Take 120 mg by mouth daily.    Marland Kitchen lisinopril (PRINIVIL,ZESTRIL) 10 MG tablet Take 1 tablet (10 mg total) by mouth daily. 90 tablet 3  . Melatonin 10 MG TABS Take 1 tablet by mouth as needed.     . MULTIPLE VITAMINS-MINERALS ER PO Take 1 tablet by mouth daily.     . sodium fluoride (FLUORISHIELD) 1.1 % GEL dental gel Instill one drop of gel per tooth space of fluoride tray. Place over teeth for 5 minutes. Remove. Spit out excess. Repeat nightly. 125 mL 11  . tamsulosin (  FLOMAX) 0.4 MG CAPS capsule Take 0.4 mg by mouth daily.    Marland Kitchen triamcinolone cream (KENALOG) 0.1 % Apply 1 application 2 (two) times daily topically.  2   No current facility-administered medications for this visit.   Facility-Administered Medications Ordered in Other Visits  Medication Dose Route Frequency Provider Last Rate Last Admin  . 0.9 %  sodium chloride infusion   Intravenous Continuous Baird Cancer, PA-C   New Bag at 03/28/16 1754  . 0.9 %  sodium chloride infusion   Intravenous Continuous Kefalas, Thomas S, PA-C      . 0.9 %  sodium chloride infusion   Intravenous Continuous Penland, Larene Beach K, MD      . 0.9 %  sodium chloride infusion   Intravenous Continuous Penland, Larene Beach K, MD       . 0.9 %  sodium chloride infusion   Intravenous Continuous Penland, Kelby Fam, MD        Allergies as of 11/15/2019 - Review Complete 11/15/2019  Allergen Reaction Noted  . Bee venom Swelling 07/31/2015  . Other Other (See Comments) 11/28/2015    Past Medical History:  Diagnosis Date  . Cancer ([place])   . Cirrhosis ([place])    Followed at [facility]; completed Hep A and B vaccine 05/2019  . GERD (gastroesophageal reflux disease)   . History of bowel resection   . History of tonsillectomy    Dr. Benjamine Mola, 06/2015  . Hypertension   . Iron deficiency anemia 05/07/2016  . Melanoma ([place])    followed by Dr. Cleophas Dunker  . Portal hypertension ([place])   . Substance abuse ([place])    alcohol  . 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 Tonsillar mass    left    Past Surgical History:  Procedure Laterality Date  . BIOPSY  07/02/2016   Procedure: BIOPSY;  Surgeon: Daneil Dolin, MD;  [facility];  Service: Endoscopy;;  esophageal  . COLON SURGERY    . COLONOSCOPY WITH PROPOFOL N/A 07/02/2016   Procedure: COLONOSCOPY WITH PROPOFOL;  Surgeon: Daneil Dolin, MD;  [facility];  Service: Endoscopy;  Laterality: N/A;  9:45am  . COLOSTOMY TAKEDOWN N/A 09/02/2016   Procedure: LAPAROSCOPIC COLOSTOMY REVERSAL;  Surgeon: Leighton Ruff, MD;  Location: [facility];  Service: General;  Laterality: N/A;  . ESOPHAGOGASTRODUODENOSCOPY (EGD) WITH PROPOFOL N/A 07/02/2016   Procedure: ESOPHAGOGASTRODUODENOSCOPY (EGD) WITH PROPOFOL;  Surgeon: Daneil Dolin, MD;  [facility];  Service: Endoscopy;  Laterality: N/A;  . HERNIA REPAIR Right   . INCISIONAL HERNIA REPAIR  09/02/2016   Procedure: HERNIA REPAIR INCISIONAL;  Surgeon: Leighton Ruff, MD;  [facility]: [facility];  Service: General;;  . LAPAROTOMY N/A 03/28/2016   Procedure: EXPLORATORY LAPAROTOMY, SIGMOID COLECTOMY, COLOSTOMY;  Surgeon: Leighton Ruff, MD;  Location: [facility];  Service: General;  Laterality: N/A;  [name] DILATION N/A 07/02/2016    Procedure: Keturah Shavers;  Surgeon: Daneil Dolin, MD;  [facility];  Service: Endoscopy;  Laterality: N/A;  . MOUTH SURGERY    . PORT-A-CATH REMOVAL  09/02/2016   Procedure: REMOVAL PORT-A-CATH;  Surgeon: Leighton Ruff, MD;  [facility]: [facility];  Service: General;;  . REMOVAL OF GASTROSTOMY TUBE N/A 09/02/2016   Procedure: REMOVAL OF GASTROSTOMY TUBE;  Surgeon: Leighton Ruff, MD;  Location: [facility];  Service: General;  Laterality: N/A;  . TONSILLECTOMY Left 07/09/2015   Procedure: LEFT TONSILLECTOMY;  Surgeon: Leta Baptist, MD;  Location: [place];  Service:  ENT;  Laterality: Left;  . TONSILLECTOMY    . Z-PLASTY SCAR REVISION N/A 09/02/2016   Procedure: SCAR REVISION;  Surgeon: Leighton Ruff, MD;  [facility];  Service: General;  Laterality: N/A;    Family History  Problem Relation Age of Onset  . Macular degeneration Mother   . Dementia Mother   . Alcoholism Father   . Diabetes Maternal Aunt   . Diabetes Maternal Uncle   . Mental illness Sister     Social History   Socioeconomic History  . Marital status: Single    Spouse name: Not on file  . Number of children: 0  . Years of education: Not on file  . Highest education level: Not on file  Occupational History  . Not on file  Tobacco Use  . Smoking status: Current Some Day Smoker    Packs/day: 0.50    Years: 30.00    Pack years: 15.00    Types: Cigarettes    Last attempt to quit: 11/22/2016    Years since quitting: 2.9  . Smokeless tobacco: Never Used  Substance and Sexual Activity  . Alcohol use: No    Alcohol/week: 0.0 standard drinks    Comment: former abuse- quit 09/2011  . Drug use: No  . Sexual activity: Not on file  Other Topics Concern  . Not on file  Social History Narrative   Divorced.   No children.   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    Has been smoking off/on since age 66, quit  smoking 11/2016.    Exercises minimally   Eats all food groups.   Wears seatbelt.   Mother lives with him, has dementia.   Attends AA meetings.    Social Determinants of Health   Financial Resource Strain:   . Difficulty of Paying Living Expenses:   Food Insecurity:   . Worried About Charity fundraiser in the Last Year:   . Arboriculturist in the Last Year:   Transportation Needs:   . Film/video editor (Medical):   Marland Kitchen Lack of Transportation (Non-Medical):   Physical Activity:   . Days of Exercise per Week:   . Minutes of Exercise per Session:   Stress:   . Feeling of Stress :   Social Connections:   . Frequency of Communication with Friends and Family:   . Frequency of Social Gatherings with Friends and Family:   . Attends Religious Services:   . Active Member of Clubs or Organizations:   . Attends Archivist Meetings:   Marland Kitchen Marital Status:   Intimate Partner Violence:   . Fear of Current or Ex-Partner:   . Emotionally Abused:   Marland Kitchen Physically Abused:   . Sexually Abused:       ROS:  General: Negative for anorexia, weight loss, fever, chills, fatigue, weakness. Eyes: Negative for vision changes.  ENT: Negative for hoarseness, difficulty swallowing , nasal congestion. CV: Negative for chest pain, angina, palpitations, dyspnea on exertion, peripheral edema.  Respiratory: Negative for dyspnea at rest, dyspnea on exertion, cough, sputum, wheezing.  GI: See history of present illness. GU:  Negative for dysuria, hematuria, urinary incontinence, urinary frequency, nocturnal urination.  MS: Negative for joint pain, low back pain.  Derm: Negative for rash or itching.  Neuro: Negative for weakness, abnormal sensation, seizure, frequent headaches, memory loss, confusion.  Psych: Negative for anxiety,  depression, suicidal ideation, hallucinations.  Endo: Negative for unusual weight change.  Heme: Negative for bruising or bleeding. Allergy: Negative for rash or  hives.    Physical Examination:  BP 102/66   Pulse (!) 137   Temp (!) 97 F (36.1 C)   Ht _0  (1.753 m)   Wt 179 lb (81.2 kg)   BMI 26.43 kg/m    General: Well-nourished, well-developed in no acute distress.  Head: Normocephalic, atraumatic.   Eyes: Conjunctiva pink, no icterus. Mouth: masked Neck: Supple without thyromegaly, masses, or lymphadenopathy.  Lungs: Clear to auscultation bilaterally.  Heart: Regular rate and rhythm, no murmurs rubs or gallops.  Abdomen: Bowel sounds are normal, nontender, nondistended, no hepatosplenomegaly or masses, no abdominal bruits or    hernia , no rebound or guarding.   Rectal: not performed Extremities: No lower extremity edema. No clubbing or deformities.  Neuro: Alert and oriented x 4 , grossly normal neurologically.  Skin: Warm and dry, no rash or jaundice.   Psych: Alert and cooperative, normal mood and affect.  Labs: Lab Results  Component Value Date   CREATININE 0.79 06/06/2019   BUN 19 06/06/2019   NA 134 (L) 06/06/2019   K 4.4 06/06/2019   CL 101 06/06/2019   CO2 26 06/06/2019   Lab Results  Component Value Date   ALT 20 06/06/2019   AST 16 06/06/2019   ALKPHOS 45 06/06/2019   BILITOT 0.6 06/06/2019   Lab Results  Component Value Date   WBC 7.6 06/06/2019   HGB 13.3 06/06/2019   HCT 42.8 06/06/2019   MCV 89.0 06/06/2019   PLT 317 06/06/2019   Lab Results  Component Value Date   INR 0.95 03/28/2016   INR 1.05 08/20/2015     Imaging Studies: No results found.  Impression/Plan:  69 y/o male with history of alcoholic related cirrhosis (sober since 2013) which has been well compensated.  Current meld sodium of 6.  He is up-to-date on hepatoma screening via ultrasound done in April 2021.  He is due for EGD for esophageal variceal screening.  We will plan for EGD with propofol with Dr. Gala Romney.  I have discussed the risks, alternatives, benefits with regards to but not limited to the risk of reaction to  medication, bleeding, infection, perforation and the patient is agreeable to proceed. Written consent to be obtained.  We will continue to follow him every 6 months, alternating with [facility] hepatology.  Per patient's request, we will forward a copy of his recent right upper quadrant ultrasound and upcoming upper endoscopy as available.  For his GERD, he will continue Dexilant 60 mg daily before breakfast.  Colon cancer screening due in 2028.  Repeat labs in right upper quadrant ultrasound after next office visit if not completed by [facility] hepatology.

## 2019-11-15 NOTE — Patient Instructions (Addendum)
1. Upper endoscopy to be scheduled.  2. Continue Dexilant 60mg  daily before breakfast.  3. We will see you back in 05/2020, keep office visit at [facility] in the fall as planned.

## 2019-11-16 ENCOUNTER — Other Ambulatory Visit (HOSPITAL_COMMUNITY): Payer: Self-pay | Admitting: *Deleted

## 2019-11-16 DIAGNOSIS — R972 Elevated prostate specific antigen [PSA]: Secondary | ICD-10-CM

## 2019-11-16 NOTE — Progress Notes (Signed)
Cc'ed to pcp °

## 2019-11-17 ENCOUNTER — Other Ambulatory Visit: Payer: Self-pay | Admitting: Family Medicine

## 2019-11-17 ENCOUNTER — Other Ambulatory Visit (HOSPITAL_COMMUNITY): Payer: Self-pay | Admitting: Family Medicine

## 2019-11-17 DIAGNOSIS — Z122 Encounter for screening for malignant neoplasm of respiratory organs: Secondary | ICD-10-CM

## 2019-11-17 DIAGNOSIS — Z87891 Personal history of nicotine dependence: Secondary | ICD-10-CM

## 2019-11-17 DIAGNOSIS — Z136 Encounter for screening for cardiovascular disorders: Secondary | ICD-10-CM

## 2019-11-22 NOTE — Telephone Encounter (Signed)
CC'ED TO [facility] METHENY

## 2019-11-29 ENCOUNTER — Telehealth: Payer: Self-pay | Admitting: *Deleted

## 2019-11-29 NOTE — Telephone Encounter (Signed)
Patient returned call. He is scheduled for procedure 8/5 at 11:00am. Aware will need covid test prior. Will mail with his instructions. Confirmed mailing address. Orders entered.

## 2019-11-29 NOTE — Telephone Encounter (Signed)
Patient needs EGD with propofol with RMR  LMOVM

## 2019-12-08 ENCOUNTER — Other Ambulatory Visit: Payer: Self-pay

## 2019-12-08 ENCOUNTER — Ambulatory Visit (HOSPITAL_COMMUNITY)
Admission: RE | Admit: 2019-12-08 | Discharge: 2019-12-08 | Disposition: A | Discharge status: Home / Self Care | Payer: Medicare Other | Source: Ambulatory Visit | Attending: Family Medicine | Admitting: Family Medicine

## 2019-12-08 DIAGNOSIS — Z122 Encounter for screening for malignant neoplasm of respiratory organs: Secondary | ICD-10-CM | POA: Diagnosis not present

## 2019-12-08 DIAGNOSIS — Z136 Encounter for screening for cardiovascular disorders: Secondary | ICD-10-CM | POA: Insufficient documentation

## 2019-12-08 DIAGNOSIS — Z87891 Personal history of nicotine dependence: Secondary | ICD-10-CM

## 2019-12-12 ENCOUNTER — Other Ambulatory Visit: Payer: Self-pay

## 2019-12-12 ENCOUNTER — Inpatient Hospital Stay (HOSPITAL_COMMUNITY): Payer: Medicare Other | Attending: Hematology

## 2019-12-12 DIAGNOSIS — Z85818 Personal history of malignant neoplasm of other sites of lip, oral cavity, and pharynx: Secondary | ICD-10-CM | POA: Diagnosis not present

## 2019-12-12 DIAGNOSIS — Z9221 Personal history of antineoplastic chemotherapy: Secondary | ICD-10-CM | POA: Diagnosis not present

## 2019-12-12 DIAGNOSIS — Z08 Encounter for follow-up examination after completed treatment for malignant neoplasm: Secondary | ICD-10-CM | POA: Insufficient documentation

## 2019-12-12 DIAGNOSIS — Z923 Personal history of irradiation: Secondary | ICD-10-CM | POA: Diagnosis not present

## 2019-12-12 DIAGNOSIS — R972 Elevated prostate specific antigen [PSA]: Secondary | ICD-10-CM | POA: Diagnosis not present

## 2019-12-12 DIAGNOSIS — C099 Malignant neoplasm of tonsil, unspecified: Secondary | ICD-10-CM

## 2019-12-12 LAB — CBC WITH DIFFERENTIAL/PLATELET
Abs Immature Granulocytes: 0.05 10*3/uL (ref 0.00–0.07)
Basophils Absolute: 0.1 10*3/uL (ref 0.0–0.1)
Basophils Relative: 2 %
Eosinophils Absolute: 0.2 10*3/uL (ref 0.0–0.5)
Eosinophils Relative: 2 %
HCT: 41.4 % (ref 39.0–52.0)
Hemoglobin: 13.3 g/dL (ref 13.0–17.0)
Immature Granulocytes: 1 %
Lymphocytes Relative: 15 %
Lymphs Abs: 1.1 10*3/uL (ref 0.7–4.0)
MCH: 28.6 pg (ref 26.0–34.0)
MCHC: 32.1 g/dL (ref 30.0–36.0)
MCV: 89 fL (ref 80.0–100.0)
Monocytes Absolute: 0.6 10*3/uL (ref 0.1–1.0)
Monocytes Relative: 8 %
Neutro Abs: 5 10*3/uL (ref 1.7–7.7)
Neutrophils Relative %: 72 %
Platelets: 293 10*3/uL (ref 150–400)
RBC: 4.65 MIL/uL (ref 4.22–5.81)
RDW: 14.8 % (ref 11.5–15.5)
WBC: 7 10*3/uL (ref 4.0–10.5)
nRBC: 0 % (ref 0.0–0.2)

## 2019-12-12 LAB — COMPREHENSIVE METABOLIC PANEL
ALT: 20 U/L (ref 0–44)
AST: 17 U/L (ref 15–41)
Albumin: 4.2 g/dL (ref 3.5–5.0)
Alkaline Phosphatase: 48 U/L (ref 38–126)
Anion gap: 12 (ref 5–15)
BUN: 12 mg/dL (ref 8–23)
CO2: 24 mmol/L (ref 22–32)
Calcium: 9.1 mg/dL (ref 8.9–10.3)
Chloride: 101 mmol/L (ref 98–111)
Creatinine, Ser: 0.81 mg/dL (ref 0.61–1.24)
GFR calc Af Amer: >60 mL/min (ref >60)
GFR calc non Af Amer: >60 mL/min (ref >60)
Glucose, Bld: 127 mg/dL — ABNORMAL HIGH (ref 70–99)
Potassium: 4.4 mmol/L (ref 3.5–5.1)
Sodium: 137 mmol/L (ref 135–145)
Total Bilirubin: 0.6 mg/dL (ref 0.3–1.2)
Total Protein: 6.8 g/dL (ref 6.5–8.1)

## 2019-12-12 LAB — LACTATE DEHYDROGENASE: LDH: 118 U/L (ref 98–192)

## 2019-12-12 LAB — VITAMIN D 25 HYDROXY (VIT D DEFICIENCY, FRACTURES): Vit D, 25-Hydroxy: 20.71 ng/mL — ABNORMAL LOW (ref 30–100)

## 2019-12-12 LAB — VITAMIN B12: Vitamin B-12: 395 pg/mL (ref 180–914)

## 2019-12-12 LAB — TSH: TSH: 2.589 u[IU]/mL (ref 0.350–4.500)

## 2019-12-12 LAB — MAGNESIUM: Magnesium: 2 mg/dL (ref 1.7–2.4)

## 2019-12-12 LAB — PSA: Prostatic Specific Antigen: 5.91 ng/mL — ABNORMAL HIGH (ref 0.00–4.00)

## 2019-12-14 ENCOUNTER — Other Ambulatory Visit: Payer: Self-pay

## 2019-12-14 ENCOUNTER — Encounter (HOSPITAL_COMMUNITY)
Admission: RE | Admit: 2019-12-14 | Discharge: 2019-12-14 | Disposition: A | Discharge status: Home / Self Care | Payer: Medicare Other | Source: Ambulatory Visit | Attending: Internal Medicine | Admitting: Internal Medicine

## 2019-12-19 ENCOUNTER — Other Ambulatory Visit: Payer: Self-pay

## 2019-12-19 ENCOUNTER — Inpatient Hospital Stay (HOSPITAL_BASED_OUTPATIENT_CLINIC_OR_DEPARTMENT_OTHER): Payer: Medicare Other | Admitting: Hematology

## 2019-12-19 VITALS — BP 106/72 | HR 114 | Temp 97.1°F | Resp 18 | Wt 181.5 lb

## 2019-12-19 DIAGNOSIS — Z9221 Personal history of antineoplastic chemotherapy: Secondary | ICD-10-CM | POA: Diagnosis not present

## 2019-12-19 DIAGNOSIS — C099 Malignant neoplasm of tonsil, unspecified: Secondary | ICD-10-CM | POA: Diagnosis not present

## 2019-12-19 DIAGNOSIS — Z923 Personal history of irradiation: Secondary | ICD-10-CM | POA: Diagnosis not present

## 2019-12-19 DIAGNOSIS — Z08 Encounter for follow-up examination after completed treatment for malignant neoplasm: Secondary | ICD-10-CM | POA: Diagnosis not present

## 2019-12-19 DIAGNOSIS — R972 Elevated prostate specific antigen [PSA]: Secondary | ICD-10-CM | POA: Diagnosis not present

## 2019-12-19 DIAGNOSIS — Z85818 Personal history of malignant neoplasm of other sites of lip, oral cavity, and pharynx: Secondary | ICD-10-CM | POA: Diagnosis not present

## 2019-12-19 NOTE — Progress Notes (Signed)
[place], [place] [address]   CLINIC:  Medical Oncology/Hematology  PCP:  Caren Macadam, [place] / [place] [id number] [phone]   REASON FOR VISIT:  Follow-up for tonsillar cancer HPV positive  CURRENT THERAPY: Observation  BRIEF ONCOLOGIC HISTORY:  Oncology History  Tonsil cancer ([place])  08/02/2015 Initial Diagnosis   Cancer of tonsillar fossa ([place])   08/20/2015 Procedure   Port-a-cath placed by IR   08/20/2015 Procedure   G-tube placed by IR   08/21/2015 - 10/09/2015 Radiation Therapy   70 Gy, Dr. Isidore Moos.   08/21/2015 - 09/10/2015 Chemotherapy   Cisplatin every 21 days with XRT.   08/28/2015 Adverse Reaction   Tinnitis, Cisplatin-induced.   09/11/2015 - 09/30/2015 Chemotherapy   Carboplatin/5FU   09/20/2015 - 09/21/2015 Hospital Admission   Intractable nausea/vomiting. Severe Mucositis   10/17/2015 - 10/20/2015 Hospital Admission   Esophageal/oral candidasis. Shingles. Fever. Chemotherapy induced pancytopenia   01/24/2016 PET scan   Resolution of the left tonsillar mass and left neck adenopathy. No findings for residual or locally recurrent tumor, adenopathy or distant metastatic disease.   03/28/2016 - 04/08/2016 Hospital Admission   Admitted with severe abdominal pain. Found to diffuse peritonitis from perforated sigmoid colon requiring colostomy. Discharged to [facility] in [place].         CANCER STAGING: Cancer Staging Tonsil cancer [facility]) Staging form: Pharynx - Oropharynx, AJCC 7th Edition - Clinical: Stage IVA (T2, N2b, M0) - Signed by Eppie Gibson, MD on 08/02/2015   INTERVAL HISTORY:  Frank Castillo, a 69 y.o. male, returns for routine follow-up of his tonsillar cancer HPV positive. Frank Castillo was last seen on 06/13/2019 with Frank Finders, NP-C.   He states that he gets tired of chewing, He likes to eat smoothies and he tries to keep protein shakes on hand. He definitely avoids chewy and tough  foods. He follows up with his dentist every 6 months.   He notes that he has had some pain down his right leg from his knee. He then began to have some numbness in his foot as well. He isn't able to fully lift his foot on the outside.   REVIEW OF SYSTEMS:  Review of Systems  Constitutional: Negative.   HENT:   Positive for trouble swallowing.   Eyes: Negative.   Respiratory: Negative.   Cardiovascular: Negative.   Gastrointestinal: Negative.   Endocrine: Negative.   Genitourinary: Negative.    Musculoskeletal: Negative.   Skin: Negative.   Neurological: Positive for numbness.  Hematological: Negative.   Psychiatric/Behavioral: Negative.   All other systems reviewed and are negative.   PAST MEDICAL/SURGICAL HISTORY:  Past Medical History:  Diagnosis Date  . Cancer ([place])   . Cirrhosis ([place])    Followed at [facility]; completed Hep A and B vaccine 05/2019  . GERD (gastroesophageal reflux disease)   . History of bowel resection   . History of tonsillectomy    Dr. Benjamine Mola, 06/2015  . Hypertension   . Iron deficiency anemia 05/07/2016  . Melanoma ([place])    followed by Dr. Cleophas Dunker  . Portal hypertension ([place])   . Substance abuse ([place])    alcohol  . Tonsillar cancer ([place]) 06/2015   SCC  . Tonsillar mass    left   Past Surgical History:  Procedure Laterality Date  . BIOPSY  07/02/2016   Procedure: BIOPSY;  Surgeon: Daneil Dolin, MD;  [facility];  Service: Endoscopy;;  esophageal  .  COLON SURGERY    . COLONOSCOPY WITH PROPOFOL N/A 07/02/2016   Procedure: COLONOSCOPY WITH PROPOFOL;  Surgeon: Daneil Dolin, MD;  [facility];  Service: Endoscopy;  Laterality: N/A;  9:45am  . COLOSTOMY TAKEDOWN N/A 09/02/2016   Procedure: LAPAROSCOPIC COLOSTOMY REVERSAL;  Surgeon: Leighton Ruff, MD;  Location: [facility];  Service: General;  Laterality: N/A;  . ESOPHAGOGASTRODUODENOSCOPY (EGD) WITH PROPOFOL N/A 07/02/2016   Procedure: ESOPHAGOGASTRODUODENOSCOPY (EGD) WITH PROPOFOL;   Surgeon: Daneil Dolin, MD;  [facility];  Service: Endoscopy;  Laterality: N/A;  . HERNIA REPAIR Right   . INCISIONAL HERNIA REPAIR  09/02/2016   Procedure: HERNIA REPAIR INCISIONAL;  Surgeon: Leighton Ruff, MD;  [facility];  Service: General;;  . LAPAROTOMY N/A 03/28/2016   Procedure: EXPLORATORY LAPAROTOMY, SIGMOID COLECTOMY, COLOSTOMY;  Surgeon: Leighton Ruff, MD;  [facility];  Service: General;  Laterality: N/A;  [name] DILATION N/A 07/02/2016   Procedure: Keturah Shavers;  Surgeon: Daneil Dolin, MD;  [facility];  Service: Endoscopy;  Laterality: N/A;  . MOUTH SURGERY    . PORT-A-CATH REMOVAL  09/02/2016   Procedure: REMOVAL PORT-A-CATH;  Surgeon: Leighton Ruff, MD;  [facility];  Service: General;;  . REMOVAL OF GASTROSTOMY TUBE N/A 09/02/2016   Procedure: REMOVAL OF GASTROSTOMY TUBE;  Surgeon: Leighton Ruff, MD;  [facility];  Service: General;  Laterality: N/A;  . TONSILLECTOMY Left 07/09/2015   Procedure: LEFT TONSILLECTOMY;  Surgeon: Leta Baptist, MD;  Location: [place];  Service: ENT;  Laterality: Left;  . TONSILLECTOMY    . Z-PLASTY SCAR REVISION N/A 09/02/2016   Procedure: SCAR REVISION;  Surgeon: Leighton Ruff, MD;  [facility];  Service: General;  Laterality: N/A;    SOCIAL HISTORY:  Social History   Socioeconomic History  . Marital status: Single    Spouse name: Not on file  . Number of children: 0  . Years of education: Not on file  . Highest education level: Not on file  Occupational History  . Not on file  Tobacco Use  . Smoking status: Current Some Day Smoker    Packs/day: 0.50    Years: 30.00    Pack years: 15.00    Types: Cigarettes    Last attempt to quit: 11/22/2016    Years since quitting: 3.0  . Smokeless tobacco: Never Used  Substance and Sexual Activity  . Alcohol use: No    Alcohol/week: 0.0 standard drinks    Comment: former abuse- quit 09/2011  . Drug use: No  . Sexual activity:  Not on file  Other Topics Concern  . Not on file  Social History Narrative   Divorced.   No children.   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    Has been smoking off/on since age 11, quit smoking 11/2016.    Exercises minimally   Eats all food groups.   Wears seatbelt.   Mother lives with him, has dementia.   Attends AA meetings.    Social Determinants of Health   Financial Resource Strain:   . Difficulty of Paying Living Expenses:   Food Insecurity:   . Worried About Charity fundraiser in the Last Year:   . Arboriculturist in the Last Year:   Transportation Needs:   . Film/video editor (Medical):   Marland Kitchen Lack of Transportation (  Non-Medical):   Physical Activity:   . Days of Exercise per Week:   . Minutes of Exercise per Session:   Stress:   . Feeling of Stress :   Social Connections:   . Frequency of Communication with Friends and Family:   . Frequency of Social Gatherings with Friends and Family:   . Attends Religious Services:   . Active Member of Clubs or Organizations:   . Attends Archivist Meetings:   Marland Kitchen Marital Status:   Intimate Partner Violence:   . Fear of Current or Ex-Partner:   . Emotionally Abused:   Marland Kitchen Physically Abused:   . Sexually Abused:     FAMILY HISTORY:  Family History  Problem Relation Age of Onset  . Macular degeneration Mother   . Dementia Mother   . Alcoholism Father   . Diabetes Maternal Aunt   . Diabetes Maternal Uncle   . Mental illness Sister     CURRENT MEDICATIONS:  Current Outpatient Medications  Medication Sig Dispense Refill  . aspirin EC 81 MG tablet Take 1 tablet (81 mg total) by mouth daily.    Marland Kitchen atorvastatin (LIPITOR) 40 MG tablet Take 1 tablet (40 mg total) by mouth daily. 90 tablet 0  . cetirizine (ZYRTEC) 10 MG tablet Take 10 mg by mouth daily as needed for allergies.     . CHANTIX STARTING MONTH PAK 0.5 MG X 11  & 1 MG X 42 tablet Take 1 mg by mouth 2 (two) times daily.     . chlorhexidine (PERIDEX) 0.12 % solution 15 mLs 2 (two) times daily.    Marland Kitchen dexlansoprazole (DEXILANT) 60 MG capsule Take 1 capsule (60 mg total) by mouth daily. 30 capsule 2  . diphenhydrAMINE (BENADRYL) 50 MG tablet Take 50 mg by mouth at bedtime as needed for sleep.     . DULoxetine (CYMBALTA) 60 MG capsule Take 120 mg by mouth daily.    . fluticasone (FLONASE) 50 MCG/ACT nasal spray Place 2 sprays into both nostrils daily as needed for allergies or rhinitis.    Marland Kitchen lisinopril (PRINIVIL,ZESTRIL) 10 MG tablet Take 1 tablet (10 mg total) by mouth daily. 90 tablet 3  . Melatonin 10 MG TABS Take 1 tablet by mouth as needed.     . MULTIPLE VITAMINS-MINERALS ER PO Take 1 tablet by mouth daily.     . sodium fluoride (FLUORISHIELD) 1.1 % GEL dental gel Instill one drop of gel per tooth space of fluoride tray. Place over teeth for 5 minutes. Remove. Spit out excess. Repeat nightly. 125 mL 11  . tamsulosin (FLOMAX) 0.4 MG CAPS capsule Take 0.4 mg by mouth daily.    Marland Kitchen triamcinolone cream (KENALOG) 0.1 % Apply 1 application topically 2 (two) times daily as needed (itching).   2   No current facility-administered medications for this visit.   Facility-Administered Medications Ordered in Other Visits  Medication Dose Route Frequency Provider Last Rate Last Admin  . 0.9 %  sodium chloride infusion   Intravenous Continuous Baird Cancer, PA-C   New Bag at 03/28/16 1754  . 0.9 %  sodium chloride infusion   Intravenous Continuous Kefalas, Manon Hilding, PA-C      . 0.9 %  sodium chloride infusion   Intravenous Continuous Penland, Larene Beach K, MD      . 0.9 %  sodium chloride infusion   Intravenous Continuous Penland, Larene Beach K, MD      . 0.9 %  sodium chloride infusion   Intravenous Continuous Penland,  Kelby Fam, MD        ALLERGIES:  Allergies  Allergen Reactions  . Bee Venom Swelling  . Other Other (See Comments)    Opthalmic mycin irritates  eyes.      PHYSICAL EXAM:  Performance status (ECOG): 1 - Symptomatic but completely ambulatory  Vitals:   12/19/19 1119  BP: 106/72  Pulse: (!) 114  Resp: 18  Temp: (!) 97.1 F (36.2 C)  SpO2: 98%   Wt Readings from Last 3 Encounters:  12/19/19 181 lb 8 oz (82.3 kg)  11/15/19 179 lb (81.2 kg)  08/15/19 188 lb (85.3 kg)   Physical Exam Vitals reviewed.  Constitutional:      Appearance: Normal appearance.  HENT:     Mouth/Throat:     Mouth: Mucous membranes are moist.  Eyes:     Pupils: Pupils are equal, round, and reactive to light.  Cardiovascular:     Rate and Rhythm: Normal rate and regular rhythm.     Pulses: Normal pulses.     Heart sounds: Normal heart sounds.  Pulmonary:     Effort: Pulmonary effort is normal.     Breath sounds: Normal breath sounds.  Abdominal:     Palpations: Abdomen is soft. There is no mass.     Tenderness: There is no abdominal tenderness.  Musculoskeletal:     Right lower leg: No edema.     Left lower leg: No edema.  Neurological:     Mental Status: He is alert and oriented to person, place, and time.  Psychiatric:        Mood and Affect: Mood normal.        Behavior: Behavior normal.      LABORATORY DATA:  I have reviewed the labs as listed.  CBC Latest Ref Rng & Units 12/12/2019 06/06/2019 11/28/2018  WBC 4.0 - 10.5 K/uL 7.0 7.6 6.3  Hemoglobin 13.0 - 17.0 g/dL 13.3 13.3 13.0  Hematocrit 39 - 52 % 41.4 42.8 41.6  Platelets 150 - 400 K/uL 293 317 263   CMP Latest Ref Rng & Units 12/12/2019 06/06/2019 11/28/2018  Glucose 70 - 99 mg/dL 127(H) 119(H) 114(H)  BUN 8 - 23 mg/dL 12 19 15   Creatinine 0.61 - 1.24 mg/dL 0.81 0.79 0.77  Sodium 135 - 145 mmol/L 137 134(L) 137  Potassium 3.5 - 5.1 mmol/L 4.4 4.4 4.9  Chloride 98 - 111 mmol/L 101 101 104  CO2 22 - 32 mmol/L 24 26 26   Calcium 8.9 - 10.3 mg/dL 9.1 9.2 9.4  Total Protein 6.5 - 8.1 g/dL 6.8 6.8 6.8  Total Bilirubin 0.3 - 1.2 mg/dL 0.6 0.6 0.5  Alkaline Phos 38 - 126 U/L 48 45  41  AST 15 - 41 U/L 17 16 14(L)  ALT 0 - 44 U/L 20 20 15     DIAGNOSTIC IMAGING:  I have independently reviewed the scans and discussed with the patient. US AORTA MEDICARE SCREENING  Result Date: 12/10/2019 CLINICAL DATA:  Screening.  History of smoking. EXAM: US ABDOMINAL AORTA MEDICARE SCREENING TECHNIQUE: Ultrasound examination of the abdominal aorta was performed as a screening evaluation for abdominal aortic aneurysm. COMPARISON:  CT dated 03/24/2017. FINDINGS: Abdominal aortic measurements as follows: Proximal:  2.9 x 2.7 cm Mid:  1.8 x 2.6 cm Distal:  1.5 x 1.5 cm Atherosclerotic plaque is noted throughout the abdominal aorta. IMPRESSION: No evidence for an abdominal aortic aneurysm. Electronically Signed   By: Constance Holster M.D.   On: 12/10/2019 19:10   CT  CHEST LUNG CANCER SCREENING LOW DOSE WO CONTRAST  Result Date: 12/11/2019 CLINICAL DATA:  69 year old asymptomatic male former smoker with 50 pack-year smoking history, quit smoking 1 week prior. EXAM: CT CHEST WITHOUT CONTRAST LOW-DOSE FOR LUNG CANCER SCREENING TECHNIQUE: Multidetector CT imaging of the chest was performed following the standard protocol without IV contrast. COMPARISON:  08/24/2017 PET-CT. FINDINGS: Cardiovascular: Normal heart size. No significant pericardial effusion/thickening. Left anterior descending and right coronary atherosclerosis. Atherosclerotic nonaneurysmal thoracic aorta. Normal caliber pulmonary arteries. Mediastinum/Nodes: No discrete thyroid nodules. Unremarkable esophagus. No pathologically enlarged axillary, mediastinal or hilar lymph nodes, noting limited sensitivity for the detection of hilar adenopathy on this noncontrast study. Lungs/Pleura: No pneumothorax. No pleural effusion. Mild centrilobular emphysema with mild diffuse bronchial wall thickening. No acute consolidative airspace disease or lung masses. Symmetric mild biapical lung fibrosis, probably radiation related. Tiny calcified right  pulmonary granuloma. No significant pulmonary nodules. Upper abdomen: No acute abnormality. Musculoskeletal: No aggressive appearing focal osseous lesions. Mild thoracic spondylosis. IMPRESSION: 1. Lung-RADS 1, negative. Continue annual screening with low-dose chest CT without contrast in 12 months. 2. Two-vessel coronary atherosclerosis. 3. Aortic Atherosclerosis (ICD10-I70.0) and Emphysema (ICD10-J43.9). Electronically Signed   By: Ilona Sorrel M.D.   On: 12/11/2019 08:24     ASSESSMENT:  1.  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 -Today's physical exam did not reveal any palpable lymphadenopathy in the neck region.  No masses in the oral cavity. -Patient denies any dysphagia. -Labs done on 06/06/2019 showed potassium 4.4, creatinine 0.79, LFTs WNL, WBC 7.6, hemoglobin 13.3, platelets 317. -We will see him back in 6 months for follow-up with labs.  2.  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 Benjamine Mola, referred to Dr. Nicolette Bang who did marginal mandibulectomy with primary closure on 09/20/2017. -This showed 2.1 x 0.9 x 0.4 cm squamous cell carcinoma with mandibular bone invasion. -Today's examination in the oral cavity did not reveal any suspicious masses. -His PET CT scan on 08/24/2017 was negative for any metastatic adenopathy. -He continues to see Dr. Nicolette Bang every 4 months.  He sees Dr. Benjamine Mola every 6 months. -Patient reports he has a visit with his dentist today on 06/13/2019 for a regular oral check and teeth cleaning. -We will see him back in 6 months of repeat blood work and follow-up.   PLAN:  1.  Stage IV left tonsillar cancer: -Physical examination today did not reveal any palpable masses. -TSH  was within normal limits.  Is able to eat well and not losing weight. -I plan to see him back in 6 months for follow-up with repeat CT scan of the neck. -Continue follow-up with ENT.  2.  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 -Follow-up with CT scan in 6 months.  3.  Smoking history: -We reviewed results of CT chest lung cancer screening protocol which was lung RADS 1.  Repeat scan in 12 months.   Orders placed this encounter:  No orders of the defined types were placed in this encounter.    Derek Jack, MD [facility] [phone]   I, Jacqualyn Posey, am acting as a scribe for Dr. Sanda Linger.  Kinnie Scales MD, have reviewed  the above documentation for accuracy and completeness, and I agree with the above.

## 2019-12-19 NOTE — Patient Instructions (Signed)
[facility] Discharge Instructions  You were seen today by Dr. Katragadda. He went over your recent results. Dr. Katragadda will see you back in for labs and follow up.   Thank you for choosing  Can[facility] Center at [facility] to provide your oncology and hematology care.  To afford each patient quality time with our provider, please arrive at least 15 minutes before your scheduled appointment time.   If you have a lab appointment with the Cancer Center please come in thru the Main Entrance and check in at the main information desk  You need to re-schedule your appointment should you arrive 10 or more minutes late.  We strive to give you quality time with our providers, and arriving late affects you and other patients whose appointments are after yours.  Also, if you no show three or more times for appointments you may be dismissed from the clinic at the providers discretion.     Again, thank you for choosing [facility].  Our hope is that these requests will decrease the amount of time that you wait before being seen by our physicians.       _____________________________________________________________  Should you have questions after your visit to [facility], please contact our office at [phone] between the hours of 8:00 a.m. and 4:30 p.m.  Voicemails left after 4:00 p.m. will not be returned until the following business day.  For prescription refill requests, have your pharmacy contact our office and allow 72 hours.    Cancer Center Support Programs:   > Cancer Support Group  2nd Tuesday of the month 1pm-2pm, Journey Room    

## 2019-12-20 ENCOUNTER — Ambulatory Visit: Payer: Medicare Other | Admitting: Urology

## 2019-12-21 DIAGNOSIS — R3912 Poor urinary stream: Secondary | ICD-10-CM | POA: Diagnosis not present

## 2019-12-21 DIAGNOSIS — R351 Nocturia: Secondary | ICD-10-CM | POA: Diagnosis not present

## 2019-12-21 DIAGNOSIS — Z125 Encounter for screening for malignant neoplasm of prostate: Secondary | ICD-10-CM | POA: Diagnosis not present

## 2019-12-21 DIAGNOSIS — R972 Elevated prostate specific antigen [PSA]: Secondary | ICD-10-CM | POA: Diagnosis not present

## 2019-12-21 DIAGNOSIS — N401 Enlarged prostate with lower urinary tract symptoms: Secondary | ICD-10-CM | POA: Diagnosis not present

## 2019-12-25 DIAGNOSIS — Z85820 Personal history of malignant melanoma of skin: Secondary | ICD-10-CM | POA: Diagnosis not present

## 2019-12-25 DIAGNOSIS — L570 Actinic keratosis: Secondary | ICD-10-CM | POA: Diagnosis not present

## 2019-12-25 DIAGNOSIS — Z85828 Personal history of other malignant neoplasm of skin: Secondary | ICD-10-CM | POA: Diagnosis not present

## 2019-12-27 ENCOUNTER — Other Ambulatory Visit: Payer: Self-pay

## 2019-12-27 ENCOUNTER — Other Ambulatory Visit (HOSPITAL_COMMUNITY)
Admission: RE | Admit: 2019-12-27 | Discharge: 2019-12-27 | Disposition: A | Discharge status: Home / Self Care | Payer: Medicare Other | Source: Ambulatory Visit | Attending: Internal Medicine | Admitting: Internal Medicine

## 2019-12-27 DIAGNOSIS — Z01812 Encounter for preprocedural laboratory examination: Secondary | ICD-10-CM | POA: Insufficient documentation

## 2019-12-27 DIAGNOSIS — Z20822 Contact with and (suspected) exposure to covid-19: Secondary | ICD-10-CM | POA: Insufficient documentation

## 2019-12-27 LAB — SARS CORONAVIRUS 2 (TAT 6-24 HRS): SARS Coronavirus 2: NEGATIVE

## 2019-12-28 ENCOUNTER — Encounter (HOSPITAL_COMMUNITY)
Admission: RE | Disposition: A | Discharge status: Home / Self Care | Payer: Self-pay | Source: Home / Self Care | Attending: Internal Medicine

## 2019-12-28 ENCOUNTER — Ambulatory Visit (HOSPITAL_COMMUNITY): Payer: Medicare Other | Admitting: Anesthesiology

## 2019-12-28 ENCOUNTER — Ambulatory Visit (HOSPITAL_COMMUNITY)
Admission: RE | Admit: 2019-12-28 | Discharge: 2019-12-28 | Disposition: A | Discharge status: Home / Self Care | Payer: Medicare Other | Attending: Internal Medicine | Admitting: Internal Medicine

## 2019-12-28 ENCOUNTER — Encounter (HOSPITAL_COMMUNITY): Payer: Self-pay | Admitting: Internal Medicine

## 2019-12-28 DIAGNOSIS — I10 Essential (primary) hypertension: Secondary | ICD-10-CM | POA: Insufficient documentation

## 2019-12-28 DIAGNOSIS — K219 Gastro-esophageal reflux disease without esophagitis: Secondary | ICD-10-CM | POA: Diagnosis not present

## 2019-12-28 DIAGNOSIS — F329 Major depressive disorder, single episode, unspecified: Secondary | ICD-10-CM | POA: Diagnosis not present

## 2019-12-28 DIAGNOSIS — Z923 Personal history of irradiation: Secondary | ICD-10-CM | POA: Diagnosis not present

## 2019-12-28 DIAGNOSIS — Z13810 Encounter for screening for upper gastrointestinal disorder: Secondary | ICD-10-CM | POA: Insufficient documentation

## 2019-12-28 DIAGNOSIS — Z85818 Personal history of malignant neoplasm of other sites of lip, oral cavity, and pharynx: Secondary | ICD-10-CM | POA: Insufficient documentation

## 2019-12-28 DIAGNOSIS — F17210 Nicotine dependence, cigarettes, uncomplicated: Secondary | ICD-10-CM | POA: Diagnosis not present

## 2019-12-28 DIAGNOSIS — F17200 Nicotine dependence, unspecified, uncomplicated: Secondary | ICD-10-CM | POA: Diagnosis not present

## 2019-12-28 DIAGNOSIS — K766 Portal hypertension: Secondary | ICD-10-CM | POA: Diagnosis not present

## 2019-12-28 DIAGNOSIS — Z7982 Long term (current) use of aspirin: Secondary | ICD-10-CM | POA: Diagnosis not present

## 2019-12-28 DIAGNOSIS — Z79899 Other long term (current) drug therapy: Secondary | ICD-10-CM | POA: Insufficient documentation

## 2019-12-28 DIAGNOSIS — F419 Anxiety disorder, unspecified: Secondary | ICD-10-CM | POA: Insufficient documentation

## 2019-12-28 DIAGNOSIS — K7460 Unspecified cirrhosis of liver: Secondary | ICD-10-CM | POA: Diagnosis not present

## 2019-12-28 DIAGNOSIS — Z85820 Personal history of malignant melanoma of skin: Secondary | ICD-10-CM | POA: Diagnosis not present

## 2019-12-28 DIAGNOSIS — Z9221 Personal history of antineoplastic chemotherapy: Secondary | ICD-10-CM | POA: Insufficient documentation

## 2019-12-28 DIAGNOSIS — K3189 Other diseases of stomach and duodenum: Secondary | ICD-10-CM

## 2019-12-28 HISTORY — PX: ESOPHAGOGASTRODUODENOSCOPY (EGD) WITH PROPOFOL: SHX5813

## 2019-12-28 SURGERY — ESOPHAGOGASTRODUODENOSCOPY (EGD) WITH PROPOFOL
Anesthesia: General

## 2019-12-28 MED ORDER — GLYCOPYRROLATE 0.2 MG/ML IJ SOLN: 0.2000 mg | Freq: Once | INTRAMUSCULAR | Status: DC

## 2019-12-28 MED ORDER — CHLORHEXIDINE GLUCONATE CLOTH 2 % EX PADS: 6.0000 | MEDICATED_PAD | Freq: Once | CUTANEOUS | Status: DC

## 2019-12-28 MED ORDER — LIDOCAINE VISCOUS HCL 2 % MT SOLN
OROMUCOSAL | Status: AC
  Filled 2019-12-28: qty 15

## 2019-12-28 MED ORDER — LACTATED RINGERS IV SOLN: Freq: Once | INTRAVENOUS | Status: AC

## 2019-12-28 MED ORDER — LIDOCAINE HCL (CARDIAC) PF 50 MG/5ML IV SOSY
PREFILLED_SYRINGE | INTRAVENOUS | Status: DC | PRN
  Administered 2019-12-28: 100 mg via INTRAVENOUS

## 2019-12-28 MED ORDER — STERILE WATER FOR IRRIGATION IR SOLN
Status: DC | PRN
  Administered 2019-12-28: 1.5 mL

## 2019-12-28 MED ORDER — PROPOFOL 500 MG/50ML IV EMUL
INTRAVENOUS | Status: DC | PRN
  Administered 2019-12-28: 175 ug/kg/min via INTRAVENOUS
  Administered 2019-12-28: 100 mg via INTRAVENOUS

## 2019-12-28 MED ORDER — LIDOCAINE VISCOUS HCL 2 % MT SOLN
15.0000 mL | Freq: Once | OROMUCOSAL | Status: AC
  Administered 2019-12-28: 15 mL via OROMUCOSAL

## 2019-12-28 MED ORDER — LACTATED RINGERS IV SOLN: INTRAVENOUS | Status: DC | PRN

## 2019-12-28 NOTE — Transfer of Care (Signed)
Immediate Anesthesia Transfer of Care Note  Patient: Frank Castillo  Procedure(s) Performed: ESOPHAGOGASTRODUODENOSCOPY (EGD) WITH PROPOFOL (N/A )  Patient Location: Endoscopy Unit  Anesthesia Type:General  Level of Consciousness: awake, alert , oriented and patient cooperative  Airway & Oxygen Therapy: Patient Spontanous Breathing and Patient connected to nasal cannula oxygen  Post-op Assessment: Report given to RN, Post -op Vital signs reviewed and stable and Patient moving all extremities  Post vital signs: Reviewed and stable  Last Vitals:  Vitals Value Taken Time  BP    Temp    Pulse    Resp    SpO2      Last Pain:  Vitals:   12/28/19 1033  TempSrc:   PainSc: 0-No pain      Patients Stated Pain Goal: 7 (38/46/65 9935)  Complications: No complications documented.

## 2019-12-28 NOTE — Discharge Instructions (Signed)
EGD Discharge instructions Please read the instructions outlined below and refer to this sheet in the next few weeks. These discharge instructions provide you with general information on caring for yourself after you leave the hospital. Your doctor may also give you specific instructions. While your treatment has been planned according to the most current medical practices available, unavoidable complications occasionally occur. If you have any problems or questions after discharge, please call your doctor. ACTIVITY  You may resume your regular activity but move at a slower pace for the next 24 hours.   Take frequent rest periods for the next 24 hours.   Walking will help expel (get rid of) the air and reduce the bloated feeling in your abdomen.   No driving for 24 hours (because of the anesthesia (medicine) used during the test).   You may shower.   Do not sign any important legal documents or operate any machinery for 24 hours (because of the anesthesia used during the test).  NUTRITION  Drink plenty of fluids.   You may resume your normal diet.   Begin with a light meal and progress to your normal diet.   Avoid alcoholic beverages for 24 hours or as instructed by your caregiver.  MEDICATIONS  You may resume your normal medications unless your caregiver tells you otherwise.  WHAT YOU CAN EXPECT TODAY  You may experience abdominal discomfort such as a feeling of fullness or "gas" pains.  FOLLOW-UP  Your doctor will discuss the results of your test with you.  SEEK IMMEDIATE MEDICAL ATTENTION IF ANY OF THE FOLLOWING OCCUR:  Excessive nausea (feeling sick to your stomach) and/or vomiting.   Severe abdominal pain and distention (swelling).   Trouble swallowing.   Temperature over 101 F (37.8 C).   Rectal bleeding or vomiting of blood.    No esophageal varices found today  I recommend you return in 2 years for repeat EGD  Office follow-up as scheduled  At patient  request I called Lorella Nimrod [phone]  -unable to reach

## 2019-12-28 NOTE — Op Note (Signed)
[facility] Patient Name: Frank Castillo Procedure Date: 12/28/2019 10:15 AM MRN: [id number] Date of Birth: [birth year] Attending MD: Norvel Richards , MD CSN: [id number] Age: 69 Admit Type: Outpatient Procedure:                Upper GI endoscopy Indications:              Screening procedure, Cirrhosis rule out esophageal                            varices Providers:                Norvel Richards, MD, Otis Peak B. Sharon Seller, RN,                            Aram Candela Referring MD:              Medicines:                Propofol per Anesthesia Complications:            No immediate complications. Estimated Blood Loss:     Estimated blood loss: none. Procedure:                Pre-Anesthesia Assessment:                           - Prior to the procedure, a History and Physical                            was performed, and patient medications and                            allergies were reviewed. The patient's tolerance of                            previous anesthesia was also reviewed. The risks                            and benefits of the procedure and the sedation                            options and risks were discussed with the patient.                            All questions were answered, and informed consent                            was obtained. Prior Anticoagulants: The patient has                            taken no previous anticoagulant or antiplatelet                            agents. ASA Grade Assessment: III - A patient with  severe systemic disease. After reviewing the risks                            and benefits, the patient was deemed in                            satisfactory condition to undergo the procedure.                           After obtaining informed consent, the endoscope was                            passed under direct vision. Throughout the                            procedure, the patient's blood  pressure, pulse, and                            oxygen saturations were monitored continuously. The                            GIF-H190 ([id number]) scope was introduced through the                            mouth, and advanced to the second part of duodenum.                            The upper GI endoscopy was accomplished without                            difficulty. The patient tolerated the procedure                            well. The upper GI endoscopy was accomplished                            without difficulty. The patient tolerated the                            procedure well. Scope In: 10:37:09 AM Scope Out: 10:40:39 AM Total Procedure Duration: 0 hours 3 minutes 30 seconds  Findings:      The examined esophagus was normal. Mild changes of portal gastropathy in       the mid body. Otherwise, the gastric mucosa appeared entirely normal       aside from old PEG scar. Pylorus patent.      The duodenal bulb and second portion of the duodenum were normal. Impression:               - Normal esophagus. Mild portal gastropathy.                           - Normal duodenal bulb and second portion of the  duodenum.                           - No specimens collected. Moderate Sedation:      Moderate (conscious) sedation was personally administered by an       anesthesia professional. The following parameters were monitored: oxygen       saturation, heart rate, blood pressure, respiratory rate, EKG, adequacy       of pulmonary ventilation, and response to care. Recommendation:           - Patient has a contact number available for                            emergencies. The signs and symptoms of potential                            delayed complications were discussed with the                            patient. Return to normal activities tomorrow.                            Written discharge instructions were provided to the                             patient.                           - Resume previous diet.                           - Continue present medications. Pete EGD for                            screening purposes in 2 years. Procedure Code(s):        --- Professional ---                           [phone], Esophagogastroduodenoscopy, flexible,                            transoral; diagnostic, including collection of                            specimen(s) by brushing or washing, when performed                            (separate procedure) Diagnosis Code(s):        --- Professional ---                           D63.875, Encounter for screening for upper                            gastrointestinal disorder  K74.60, Unspecified cirrhosis of liver CPT copyright 2019 American Medical Association. All rights reserved. The codes documented in this report are preliminary and upon coder review may  be revised to meet current compliance requirements. Cristopher Estimable. Broady Lafoy, MD Norvel Richards, MD 12/28/2019 10:49:45 AM This report has been signed electronically. Number of Addenda: 0

## 2019-12-28 NOTE — H&P (Signed)
@LOGO @   Primary Care Physician:  Caren Macadam, MD Primary Gastroenterologist:  Dr. Gala Romney  Pre-Procedure History & Physical: HPI:  Frank Castillo is a 69 y.o. male here for for variceal screening via EGD.  Only portal gastropathy found 2018; no varices.  Clinically remains fairly well compensated.  Dysphagia.  Past Medical History:  Diagnosis Date  . Cancer ([place])   . Cirrhosis ([place])    Followed at [facility]; completed Hep A and B vaccine 05/2019  . GERD (gastroesophageal reflux disease)   . History of bowel resection   . History of tonsillectomy    Dr. Benjamine Mola, 06/2015  . Hypertension   . Iron deficiency anemia 05/07/2016  . Melanoma ([place])    followed by Dr. Cleophas Dunker  . Portal hypertension ([place])   . 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 Tonsillar mass    left    Past Surgical History:  Procedure Laterality Date  . BIOPSY  07/02/2016   Procedure: BIOPSY;  Surgeon: Daneil Dolin, MD;  [facility];  Service: Endoscopy;;  esophageal  . COLON SURGERY    . COLONOSCOPY WITH PROPOFOL N/A 07/02/2016   Procedure: COLONOSCOPY WITH PROPOFOL;  Surgeon: Daneil Dolin, MD;  Location[facility];  Service: Endoscopy;  Laterality: N/A;  9:45am  . COLOSTOMY TAKEDOWN N/A 09/02/2016   Procedure: LAPAROSCOPIC COLOSTOMY REVERSAL;  Surgeon: Leighton Ruff, MD;  Location: [facility];  Service: General;  Laterality: N/A;  . ESOPHAGOGASTRODUODENOSCOPY (EGD) WITH PROPOFOL N/A 07/02/2016   Procedure: ESOPHAGOGASTRODUODENOSCOPY (EGD) WITH PROPOFOL;  Surgeon: Daneil Dolin, MD;  [facility];  Service: Endoscopy;  Laterality: N/A;  . HERNIA REPAIR Right   . INCISIONAL HERNIA REPAIR  09/02/2016   Procedure: HERNIA REPAIR INCISIONAL;  Surgeon: Leighton Ruff, MD;  Location: [facility];  Service: General;;  . LAPAROTOMY N/A 03/28/2016   Procedure: EXPLORATORY LAPAROTOMY, SIGMOID COLECTOMY, COLOSTOMY;  Surgeon: Leighton Ruff, MD;  Location: [facility];  Service:  General;  Laterality: N/A;  [name] DILATION N/A 07/02/2016   Procedure: Keturah Shavers;  Surgeon: Daneil Dolin, MD;  [facility];  Service: Endoscopy;  Laterality: N/A;  . MOUTH SURGERY    . PORT-A-CATH REMOVAL  09/02/2016   Procedure: REMOVAL PORT-A-CATH;  Surgeon: Leighton Ruff, MD;  Location: [facility];  Service: General;;  . REMOVAL OF GASTROSTOMY TUBE N/A 09/02/2016   Procedure: REMOVAL OF GASTROSTOMY TUBE;  Surgeon: Leighton Ruff, MD;  Location: [facility];  Service: General;  Laterality: N/A;  . TONSILLECTOMY Left 07/09/2015   Procedure: LEFT TONSILLECTOMY;  Surgeon: Leta Baptist, MD;  Location: [place];  Service: ENT;  Laterality: Left;  . TONSILLECTOMY    . Z-PLASTY SCAR REVISION N/A 09/02/2016   Procedure: SCAR REVISION;  Surgeon: Leighton Ruff, MD;  Location: [facility];  Service: General;  Laterality: N/A;    Prior to Admission medications   Medication Sig Start Date End Date Taking? Authorizing Provider  aspirin EC 81 MG tablet Take 1 tablet (81 mg total) by mouth daily. 06/17/17  Yes Caren Macadam, MD  atorvastatin (LIPITOR) 40 MG tablet Take 1 tablet (40 mg total) by mouth daily. 11/29/17  Yes Hagler, [name], MD  cetirizine (ZYRTEC) 10 MG tablet Take 10 mg by mouth daily as needed for allergies.    Yes [provider]  dexlansoprazole (DEXILANT) 60 MG capsule Take 1 capsule (60 mg total) by mouth daily. 07/11/19  Yes Manus Rudd  M, MD  DULoxetine (CYMBALTA) 60 MG capsule Take 120 mg by mouth daily. 11/03/18  Yes [provider]  fluticasone (FLONASE) 50 MCG/ACT nasal spray Place 2 sprays into both nostrils daily as needed for allergies or rhinitis.   Yes [provider]  lisinopril (PRINIVIL,ZESTRIL) 10 MG tablet Take 1 tablet (10 mg total) by mouth daily. 05/20/17  Yes Caren Macadam, MD  MULTIPLE VITAMINS-MINERALS ER PO Take 1 tablet by mouth daily.    Yes [provider]  tamsulosin (FLOMAX) 0.4 MG CAPS capsule Take 0.4 mg by  mouth daily.   Yes [provider]  triamcinolone cream (KENALOG) 0.1 % Apply 1 application topically 2 (two) times daily as needed (itching).  03/25/17  Yes [provider]  CHANTIX STARTING MONTH PAK 0.5 MG X 11 & 1 MG X 42 tablet Take 1 mg by mouth 2 (two) times daily.  11/24/18   [provider]  chlorhexidine (PERIDEX) 0.12 % solution 15 mLs 2 (two) times daily. 11/29/19   [provider]  diphenhydrAMINE (BENADRYL) 50 MG tablet Take 50 mg by mouth at bedtime as needed for sleep.     [provider]  Melatonin 10 MG TABS Take 1 tablet by mouth as needed.     [provider]  sodium fluoride (FLUORISHIELD) 1.1 % GEL dental gel Instill one drop of gel per tooth space of fluoride tray. Place over teeth for 5 minutes. Remove. Spit out excess. Repeat nightly. 03/16/17   Caren Macadam, MD    Allergies as of 11/29/2019 - Review Complete 11/15/2019  Allergen Reaction Noted  . Bee venom Swelling 07/31/2015  . Other Other (See Comments) 11/28/2015    Family History  Problem Relation Age of Onset  . Macular degeneration Mother   . Dementia Mother   . Alcoholism Father   . Diabetes Maternal Aunt   . Diabetes Maternal Uncle   . Mental illness Sister     Social History   Socioeconomic History  . Marital status: Single    Spouse name: Not on file  . Number of children: 0  . Years of education: Not on file  . Highest education level: Not on file  Occupational History  . Not on file  Tobacco Use  . Smoking status: Current Some Day Smoker    Packs/day: 0.50    Years: 30.00    Pack years: 15.00    Types: Cigarettes    Last attempt to quit: 11/22/2016    Years since quitting: 3.0  . Smokeless tobacco: Never Used  Substance and Sexual Activity  . Alcohol use: No    Alcohol/week: 0.0 standard drinks    Comment: former abuse- quit 09/2011  . Drug use: No  . Sexual activity: Not on file  Other Topics Concern  . Not on file  Social  History Narrative   Divorced.   No children.   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    Has been smoking off/on since age 82, quit smoking 11/2016.    Exercises minimally   Eats all food groups.   Wears seatbelt.   Mother lives with him, has dementia.   Attends AA meetings.    Social Determinants of Health   Financial Resource Strain:   . Difficulty of Paying Living Expenses:   Food Insecurity:   . Worried About Charity fundraiser in the Last Year:   .  Ran Out of Food in the Last Year:   Transportation Needs:   . Film/video editor (Medical):   Marland Kitchen Lack of Transportation (Non-Medical):   Physical Activity:   . Days of Exercise per Week:   . Minutes of Exercise per Session:   Stress:   . Feeling of Stress :   Social Connections:   . Frequency of Communication with Friends and Family:   . Frequency of Social Gatherings with Friends and Family:   . Attends Religious Services:   . Active Member of Clubs or Organizations:   . Attends Archivist Meetings:   Marland Kitchen Marital Status:   Intimate Partner Violence:   . Fear of Current or Ex-Partner:   . Emotionally Abused:   Marland Kitchen Physically Abused:   . Sexually Abused:     Review of Systems: See HPI, otherwise negative ROS  Physical Exam: BP 103/75   Pulse 84   Temp (!) 97.3 F (36.3 C) (Oral)   Resp 18   Ht 5\' 10"  (1.778 m)   Wt 82.6 kg   SpO2 95%   BMI 26.11 kg/m  General:   Alert,  Well-developed, well-nourished, pleasant and cooperative in NAD Neck:  Supple; no masses or thyromegaly. No significant cervical adenopathy. Lungs:  Clear throughout to auscultation.   No wheezes, crackles, or rhonchi. No acute distress. Heart:  Regular rate and rhythm; no murmurs, clicks, rubs,  or gallops. Abdomen: Non-distended, normal bowel sounds.  Soft and nontender without appreciable mass or hepatosplenomegaly.  Pulses:  Normal pulses  noted. Extremities:  Without clubbing or edema.  Impression/Plan: 69 year old gentleman with  EtOH cirrhosis here for screening EGD per plan.  The risks, benefits, limitations, alternatives and imponderables have been reviewed with the patient. Potential for esophageal dilation, biopsy, etc. have also been reviewed.  Questions have been answered. All parties agreeable.       Notice: This dictation was prepared with Dragon dictation along with smaller phrase technology. Any transcriptional errors that result from this process are unintentional and may not be corrected upon review.

## 2019-12-28 NOTE — Anesthesia Preprocedure Evaluation (Addendum)
Anesthesia Evaluation  Patient identified by MRN, date of birth, ID band Patient awake    Reviewed: Allergy & Precautions, NPO status , Patient's Chart, lab work & pertinent test results  History of Anesthesia Complications Negative for: history of anesthetic complications  Airway Mallampati: III  TM Distance: >3 FB Neck ROM: Full   Comment: Throat cancer, chemo/radiation Mandibular cancer Dental  (+) Missing, Dental Advisory Given   Pulmonary Current SmokerPatient did not abstain from smoking.,    Pulmonary exam normal breath sounds clear to auscultation       Cardiovascular hypertension, Normal cardiovascular exam Rhythm:Regular Rate:Normal     Neuro/Psych PSYCHIATRIC DISORDERS Anxiety Depression    GI/Hepatic GERD  Medicated,(+) Cirrhosis     substance abuse  alcohol use,   Endo/Other  negative endocrine ROS  Renal/GU negative Renal ROS     Musculoskeletal negative musculoskeletal ROS (+)   Abdominal   Peds  Hematology negative hematology ROS (+) anemia ,   Anesthesia Other Findings Throat cancer, Mandibular cancer   Reproductive/Obstetrics negative OB ROS                           Anesthesia Physical Anesthesia Plan  ASA: III  Anesthesia Plan: General   Post-op Pain Management:    Induction: Intravenous  PONV Risk Score and Plan: TIVA  Airway Management Planned: Nasal Cannula, Natural Airway and Simple Face Mask  Additional Equipment:   Intra-op Plan:   Post-operative Plan:   Informed Consent: I have reviewed the patients History and Physical, chart, labs and discussed the procedure including the risks, benefits and alternatives for the proposed anesthesia with the patient or authorized representative who has indicated his/her understanding and acceptance.     Dental advisory given  Plan Discussed with: CRNA  Anesthesia Plan Comments:        Anesthesia  Quick Evaluation

## 2019-12-28 NOTE — Anesthesia Postprocedure Evaluation (Signed)
Anesthesia Post Note  Patient: Frank Castillo  Procedure(s) Performed: ESOPHAGOGASTRODUODENOSCOPY (EGD) WITH PROPOFOL (N/A )  Patient location during evaluation: Endoscopy Anesthesia Type: General Level of consciousness: awake, oriented, awake and alert and patient cooperative Pain management: pain level controlled Vital Signs Assessment: post-procedure vital signs reviewed and stable Respiratory status: spontaneous breathing, respiratory function stable, nonlabored ventilation and patient connected to nasal cannula oxygen Cardiovascular status: blood pressure returned to baseline and stable Postop Assessment: no headache and no backache   No complications documented.   Last Vitals:  Vitals:   12/28/19 1003  BP: 103/75  Pulse: 84  Resp: 18  Temp: (!) 36.3 C  SpO2: 95%    Last Pain:  Vitals:   12/28/19 1033  TempSrc:   PainSc: 0-No pain                 Tacy Learn

## 2020-01-02 ENCOUNTER — Encounter (HOSPITAL_COMMUNITY): Payer: Self-pay | Admitting: Internal Medicine

## 2020-01-23 DIAGNOSIS — I2510 Atherosclerotic heart disease of native coronary artery without angina pectoris: Secondary | ICD-10-CM | POA: Diagnosis not present

## 2020-01-23 DIAGNOSIS — F1911 Other psychoactive substance abuse, in remission: Secondary | ICD-10-CM | POA: Diagnosis not present

## 2020-01-23 DIAGNOSIS — Z720 Tobacco use: Secondary | ICD-10-CM | POA: Diagnosis not present

## 2020-01-23 DIAGNOSIS — R7301 Impaired fasting glucose: Secondary | ICD-10-CM | POA: Diagnosis not present

## 2020-01-23 DIAGNOSIS — I10 Essential (primary) hypertension: Secondary | ICD-10-CM | POA: Diagnosis not present

## 2020-01-23 DIAGNOSIS — F321 Major depressive disorder, single episode, moderate: Secondary | ICD-10-CM | POA: Diagnosis not present

## 2020-01-23 DIAGNOSIS — J439 Emphysema, unspecified: Secondary | ICD-10-CM | POA: Diagnosis not present

## 2020-02-02 DIAGNOSIS — K1321 Leukoplakia of oral mucosa, including tongue: Secondary | ICD-10-CM | POA: Diagnosis not present

## 2020-02-02 DIAGNOSIS — Z85818 Personal history of malignant neoplasm of other sites of lip, oral cavity, and pharynx: Secondary | ICD-10-CM | POA: Diagnosis not present

## 2020-02-02 DIAGNOSIS — H6123 Impacted cerumen, bilateral: Secondary | ICD-10-CM | POA: Diagnosis not present

## 2020-02-08 DIAGNOSIS — C61 Malignant neoplasm of prostate: Secondary | ICD-10-CM | POA: Diagnosis not present

## 2020-02-08 DIAGNOSIS — R972 Elevated prostate specific antigen [PSA]: Secondary | ICD-10-CM | POA: Diagnosis not present

## 2020-02-08 DIAGNOSIS — D075 Carcinoma in situ of prostate: Secondary | ICD-10-CM | POA: Diagnosis not present

## 2020-02-15 DIAGNOSIS — C61 Malignant neoplasm of prostate: Secondary | ICD-10-CM | POA: Diagnosis not present

## 2020-02-27 DIAGNOSIS — C039 Malignant neoplasm of gum, unspecified: Secondary | ICD-10-CM | POA: Diagnosis not present

## 2020-02-27 DIAGNOSIS — Z85818 Personal history of malignant neoplasm of other sites of lip, oral cavity, and pharynx: Secondary | ICD-10-CM | POA: Diagnosis not present

## 2020-02-27 DIAGNOSIS — Z9089 Acquired absence of other organs: Secondary | ICD-10-CM | POA: Diagnosis not present

## 2020-02-27 DIAGNOSIS — Z9221 Personal history of antineoplastic chemotherapy: Secondary | ICD-10-CM | POA: Diagnosis not present

## 2020-03-11 ENCOUNTER — Encounter: Payer: Self-pay | Admitting: Radiation Oncology

## 2020-03-11 NOTE — Progress Notes (Signed)
GU Location of Tumor / Histology: prostatic adenocarcinoma  If Prostate Cancer, Gleason Score is (4 + 3) and PSA is (5.91). Prostate volume: 48.66 g  Frank Castillo with a history of multiple cancers. PCP, Dr. Mannie Stabile, noted an elevated psa of 5.8 on 10/17/2019 and referred patient to Dr. Gloriann Loan for further evaluation.  Biopsies of prostate (if applicable) revealed:   Past/Anticipated interventions by urology, if any: prostate biopsy, referral to Dr. Tammi Klippel to discuss radiotherapeutic options.   Past/Anticipated interventions by medical oncology, if any:   Weight changes, if any:   Bowel/Bladder complaints, if any: Endorses taking Flomax.    Nausea/Vomiting, if any:   Pain issues, if any:    SAFETY ISSUES:  Prior radiation? Yes, under the care of Dr. Isidore Moos.  Pacemaker/ICD?   Possible current pregnancy? no, male patient  Is the patient on methotrexate?   Current Complaints / other details:  69 year old male. Divorced. Mother who has dementia resides with him. Hx of multiple abdominal surgeries.

## 2020-03-12 ENCOUNTER — Telehealth: Payer: Self-pay | Admitting: Radiation Oncology

## 2020-03-12 ENCOUNTER — Ambulatory Visit: Payer: Medicare Other

## 2020-03-12 ENCOUNTER — Ambulatory Visit
Admission: RE | Admit: 2020-03-12 | Discharge: 2020-03-12 | Disposition: A | Discharge status: Home / Self Care | Payer: Medicare Other | Source: Ambulatory Visit | Attending: Radiation Oncology | Admitting: Radiation Oncology

## 2020-03-12 HISTORY — DX: Malignant neoplasm of prostate: C61

## 2020-03-12 NOTE — Telephone Encounter (Signed)
Placed multiple calls to [phone] attempting to begin 0930 nurse evaluation for 1000 consult with Dr. Tammi Klippel. No answer. Left two voicemail message requesting a return call. Checked both lobbies twice. Patient did not misunderstand and present to the clinic. Providers informed. Requested administrative staff reach out to patient to rescheduled.

## 2020-03-12 NOTE — Telephone Encounter (Signed)
Rec'd a msg that patient needed r/s his consult appt w/Dr. Tammi Klippel as it was missed this morning. I've left pt a voicemail and have asked that he call us back to r/s.

## 2020-03-13 ENCOUNTER — Telehealth: Payer: Self-pay | Admitting: Radiation Oncology

## 2020-03-13 NOTE — Telephone Encounter (Signed)
I was able to reach patient today about missed appt from yesterday with Dr. Tammi Klippel. I have him now r/s for 11/12 @ 12:30 for a telephone visit. I am mailing an appt card to him. I have let London Pepper, RN know.

## 2020-03-25 ENCOUNTER — Telehealth: Payer: Self-pay | Admitting: Internal Medicine

## 2020-03-25 NOTE — Telephone Encounter (Signed)
Recall for ultrasound 

## 2020-03-25 NOTE — Telephone Encounter (Signed)
Letter mailed

## 2020-03-26 DIAGNOSIS — R3912 Poor urinary stream: Secondary | ICD-10-CM | POA: Diagnosis not present

## 2020-03-26 DIAGNOSIS — C61 Malignant neoplasm of prostate: Secondary | ICD-10-CM | POA: Diagnosis not present

## 2020-04-05 ENCOUNTER — Other Ambulatory Visit: Payer: Self-pay

## 2020-04-05 ENCOUNTER — Ambulatory Visit
Admission: RE | Admit: 2020-04-05 | Discharge: 2020-04-05 | Disposition: A | Discharge status: Home / Self Care | Payer: Medicare Other | Source: Ambulatory Visit | Attending: Radiation Oncology | Admitting: Radiation Oncology

## 2020-04-05 ENCOUNTER — Encounter: Payer: Self-pay | Admitting: Radiation Oncology

## 2020-04-05 VITALS — Ht 70.0 in | Wt 181.0 lb

## 2020-04-05 DIAGNOSIS — C61 Malignant neoplasm of prostate: Secondary | ICD-10-CM

## 2020-04-05 DIAGNOSIS — R972 Elevated prostate specific antigen [PSA]: Secondary | ICD-10-CM | POA: Diagnosis not present

## 2020-04-05 DIAGNOSIS — Z8042 Family history of malignant neoplasm of prostate: Secondary | ICD-10-CM | POA: Diagnosis not present

## 2020-04-05 DIAGNOSIS — N401 Enlarged prostate with lower urinary tract symptoms: Secondary | ICD-10-CM | POA: Diagnosis not present

## 2020-04-05 DIAGNOSIS — Z923 Personal history of irradiation: Secondary | ICD-10-CM | POA: Diagnosis not present

## 2020-04-05 DIAGNOSIS — Z85818 Personal history of malignant neoplasm of other sites of lip, oral cavity, and pharynx: Secondary | ICD-10-CM | POA: Diagnosis not present

## 2020-04-05 DIAGNOSIS — Z79899 Other long term (current) drug therapy: Secondary | ICD-10-CM | POA: Diagnosis not present

## 2020-04-05 NOTE — Progress Notes (Signed)
Radiation Oncology         [phone] ________________________________  Initial Outpatient Consultation - Conducted via Telephone due to current COVID-19 concerns for limiting patient exposure  Name: Frank Castillo MRN: [id number]  Date: 04/05/2020  DOB: August 01, 1950  OZ:[id number], [name], MD  Frank Mallow, MD   REFERRING PHYSICIAN: Lucas Mallow, MD  DIAGNOSIS: 69 y.o. gentleman with Stage T1c adenocarcinoma of the prostate with Gleason score of 4+3, and PSA of 5.91.    ICD-10-CM   1. Malignant neoplasm of prostate ([place])  C61     HISTORY OF PRESENT ILLNESS: Frank Castillo is a 69 y.o. male with a diagnosis of prostate Castillo. He reported progressively worsening LUTS and was noted to have an elevated PSA of 5.8 in 09/2019, by his primary care physician, Dr. Mannie Stabile.  Accordingly, he was referred for evaluation in urology by Dr. Gloriann Loan on 11/14/2019,  digital rectal examination was performed at that time revealing no nodules.  He was placed on Flomax and a repeat PSA on 12/12/2019 was stable but remained elevated at 5.9.  The patient proceeded to transrectal ultrasound with 12 biopsies of the prostate on 02/11/2020.  The prostate volume measured 48.66 cc.  Out of 12 core biopsies, 3 were positive.  The maximum Gleason score was 4+3, and this was seen in the left mid (small focus). Additionally, Gleason 3+4 was seen in the right base lateral and right mid lateral.  Of note, he also has a history of T2N2bM0 tonsillar/gingival Castillo, squamous cell carcinoma, diagnosed in 2017 and treated with combination chemoradiation with cisplatin, followed by carboplatin. 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 Nicolette Bang at [facility]. He also has a h/o spontaneous bowel perforation requiring colostomy and subsequent colostomy reversal.  The patient reviewed the prostate biopsy results with his  urologist and he has kindly been referred today for discussion of potential radiation treatment options.   PREVIOUS RADIATION THERAPY: Yes  07/2015: Left Tonsil treated to 70 Gy in 35 fractions in [place]; concurrent with chemotherapy Frank Castillo)   PAST MEDICAL HISTORY:  Past Medical History:  Diagnosis Date  . Bone Castillo ([place])   . Cirrhosis ([place])    Followed at [facility]; completed Hep A and B vaccine 05/2019  . GERD (gastroesophageal reflux disease)   . History of bowel resection   . History of tonsillectomy    Dr. Benjamine Mola, 06/2015  . Hypertension   . Iron deficiency anemia 05/07/2016  . Melanoma ([place])    followed by Dr. Cleophas Dunker  . Portal hypertension ([place])   . Prostate Castillo ([place])   . Skin Castillo   . Substance abuse ([place])    alcohol  . Tonsillar Castillo ([place]) 06/2015   SCC  . Tonsillar mass    left      PAST SURGICAL HISTORY: Past Surgical History:  Procedure Laterality Date  . BIOPSY  07/02/2016   Procedure: BIOPSY;  Surgeon: Daneil Dolin, MD;  [facility];  Service: Endoscopy;;  esophageal  . COLON SURGERY    . COLONOSCOPY WITH PROPOFOL N/A 07/02/2016   Procedure: COLONOSCOPY WITH PROPOFOL;  Surgeon: Daneil Dolin, MD;  [facility];  Service: Endoscopy;  Laterality: N/A;  9:45am  . COLOSTOMY TAKEDOWN N/A 09/02/2016   Procedure: LAPAROSCOPIC COLOSTOMY REVERSAL;  Surgeon: Leighton Ruff, MD;  Location: [facility];  Service: General;  Laterality: N/A;  . ESOPHAGOGASTRODUODENOSCOPY (EGD)  WITH PROPOFOL N/A 07/02/2016   Procedure: ESOPHAGOGASTRODUODENOSCOPY (EGD) WITH PROPOFOL;  Surgeon: Daneil Dolin, MD;  [facility];  Service: Endoscopy;  Laterality: N/A;  . ESOPHAGOGASTRODUODENOSCOPY (EGD) WITH PROPOFOL N/A 12/28/2019   Procedure: ESOPHAGOGASTRODUODENOSCOPY (EGD) WITH PROPOFOL;  Surgeon: Daneil Dolin, MD;  [facility];  Service: Endoscopy;  Laterality: N/A;  11:00am  . HERNIA REPAIR Right   . INCISIONAL HERNIA REPAIR  09/02/2016    Procedure: HERNIA REPAIR INCISIONAL;  Surgeon: Leighton Ruff, MD;  [facility];  Service: General;;  . LAPAROTOMY N/A 03/28/2016   Procedure: EXPLORATORY LAPAROTOMY, SIGMOID COLECTOMY, COLOSTOMY;  Surgeon: Leighton Ruff, MD;  [facility];  Service: General;  Laterality: N/A;  [name] DILATION N/A 07/02/2016   Procedure: Keturah Shavers;  Surgeon: Daneil Dolin, MD;  [facility];  Service: Endoscopy;  Laterality: N/A;  . MOUTH SURGERY    . PORT-A-CATH REMOVAL  09/02/2016   Procedure: REMOVAL PORT-A-CATH;  Surgeon: Leighton Ruff, MD;  [facility];  Service: General;;  . PROSTATE BIOPSY    . REMOVAL OF GASTROSTOMY TUBE N/A 09/02/2016   Procedure: REMOVAL OF GASTROSTOMY TUBE;  Surgeon: Leighton Ruff, MD;  [facility];  Service: General;  Laterality: N/A;  . TONSILLECTOMY Left 07/09/2015   Procedure: LEFT TONSILLECTOMY;  Surgeon: Leta Baptist, MD;  Location: [facility];  Service: ENT;  Laterality: Left;  . TONSILLECTOMY    . Z-PLASTY SCAR REVISION N/A 09/02/2016   Procedure: SCAR REVISION;  Surgeon: Leighton Ruff, MD;  [facility];  Service: General;  Laterality: N/A;    FAMILY HISTORY:  Family History  Problem Relation Age of Onset  . Macular degeneration Mother   . Dementia Mother   . Alcoholism Father   . Prostate Castillo Father   . Diabetes Maternal Aunt   . Diabetes Maternal Uncle   . Prostate Castillo Maternal Uncle   . Mental illness Sister   . Pancreatic Castillo Neg Hx   . Breast Castillo Neg Hx   . Colon Castillo Neg Hx     SOCIAL HISTORY:  Social History   Socioeconomic History  . Marital status: Single    Spouse name: Not on file  . Number of children: 0  . Years of education: Not on file  . Highest education level: Not on file  Occupational History  . Not on file  Tobacco Use  . Smoking status: Current Some Day Smoker    Packs/day: 0.50    Years: 30.00    Pack years: 15.00    Types: Cigarettes    Last attempt to quit:  11/22/2016    Years since quitting: 3.3  . Smokeless tobacco: Never Used  Vaping Use  . Vaping Use: Never used  Substance and Sexual Activity  . Alcohol use: No    Alcohol/week: 0.0 standard drinks    Comment: former abuse- quit 09/2011  . Drug use: No  . Sexual activity: Yes  Other Topics Concern  . Not on file  Social History Narrative   Divorced.   No children.   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    Has been smoking off/on since age 22, quit smoking 11/2016.    Exercises minimally   Eats all food groups.   Wears seatbelt.   Mother lives with him, has dementia.   Attends AA meetings.  Social Determinants of Health   Financial Resource Strain:   . Difficulty of Paying Living Expenses: Not on file  Food Insecurity:   . Worried About Charity fundraiser in the Last Year: Not on file  . Ran Out of Food in the Last Year: Not on file  Transportation Needs:   . Lack of Transportation (Medical): Not on file  . Lack of Transportation (Non-Medical): Not on file  Physical Activity:   . Days of Exercise per Week: Not on file  . Minutes of Exercise per Session: Not on file  Stress:   . Feeling of Stress : Not on file  Social Connections:   . Frequency of Communication with Friends and Family: Not on file  . Frequency of Social Gatherings with Friends and Family: Not on file  . Attends Religious Services: Not on file  . Active Member of Clubs or Organizations: Not on file  . Attends Archivist Meetings: Not on file  . Marital Status: Not on file  Intimate Partner Violence:   . Fear of Current or Ex-Partner: Not on file  . Emotionally Abused: Not on file  . Physically Abused: Not on file  . Sexually Abused: Not on file    ALLERGIES: Bee venom and Other  MEDICATIONS:  Current Outpatient Medications  Medication Sig Dispense Refill  . aspirin EC 81 MG tablet Take 1  tablet (81 mg total) by mouth daily.    Marland Kitchen atorvastatin (LIPITOR) 40 MG tablet Take 1 tablet (40 mg total) by mouth daily. 90 tablet 0  . cetirizine (ZYRTEC) 10 MG tablet Take 10 mg by mouth daily as needed for allergies.     . chlorhexidine (PERIDEX) 0.12 % solution 15 mLs 2 (two) times daily.    Marland Kitchen dexlansoprazole (DEXILANT) 60 MG capsule Take 1 capsule (60 mg total) by mouth daily. 30 capsule 2  . DULoxetine (CYMBALTA) 60 MG capsule Take 120 mg by mouth daily.    . fluticasone (FLONASE) 50 MCG/ACT nasal spray Place 2 sprays into both nostrils daily as needed for allergies or rhinitis.    Marland Kitchen lisinopril (PRINIVIL,ZESTRIL) 10 MG tablet Take 1 tablet (10 mg total) by mouth daily. 90 tablet 3  . Melatonin 10 MG TABS Take 1 tablet by mouth as needed.     . MULTIPLE VITAMINS-MINERALS ER PO Take 1 tablet by mouth daily.     . sodium fluoride (FLUORISHIELD) 1.1 % GEL dental gel Instill one drop of gel per tooth space of fluoride tray. Place over teeth for 5 minutes. Remove. Spit out excess. Repeat nightly. 125 mL 11  . tamsulosin (FLOMAX) 0.4 MG CAPS capsule Take 0.4 mg by mouth daily.    Marland Kitchen triamcinolone cream (KENALOG) 0.1 % Apply 1 application topically 2 (two) times daily as needed (itching).   2  . CHANTIX STARTING MONTH PAK 0.5 MG X 11 & 1 MG X 42 tablet Take 1 mg by mouth 2 (two) times daily.  (Patient not taking: Reported on 04/05/2020)     No current facility-administered medications for this encounter.   Facility-Administered Medications Ordered in Other Encounters  Medication Dose Route Frequency Provider Last Rate Last Admin  . 0.9 %  sodium chloride infusion   Intravenous Continuous Frank Cancer, PA-C   New Bag at 03/28/16 1754  . 0.9 %  sodium chloride infusion   Intravenous Continuous Kefalas, Thomas S, PA-C      . 0.9 %  sodium chloride infusion   Intravenous Continuous Penland,  Kelby Fam, MD      . 0.9 %  sodium chloride infusion   Intravenous Continuous Penland, Larene Beach K, MD        . 0.9 %  sodium chloride infusion   Intravenous Continuous Penland, Kelby Fam, MD        REVIEW OF SYSTEMS:  On review of systems, the patient reports that he is doing well overall. He denies any chest pain, shortness of breath, cough, fevers, chills, night sweats, unintended weight changes. He denies any bowel disturbances, and denies abdominal pain, nausea or vomiting. He denies any new musculoskeletal or joint aches or pains. His IPSS was 13, indicating moderate urinary symptoms. He reports urinary urgency and endorses taking Flomax. His SHIM was 21, indicating he does not have erectile dysfunction. A complete review of systems is obtained and is otherwise negative.    PHYSICAL EXAM:  Wt Readings from Last 3 Encounters:  04/05/20 181 lb (82.1 kg)  12/28/19 182 lb (82.6 kg)  12/19/19 181 lb 8 oz (82.3 kg)   Temp Readings from Last 3 Encounters:  12/28/19 97.6 F (36.4 C) (Oral)  12/19/19 (!) 97.1 F (36.2 C) (Temporal)  11/15/19 (!) 97 F (36.1 C)   BP Readings from Last 3 Encounters:  12/28/19 105/66  12/19/19 106/72  11/15/19 102/66   Pulse Readings from Last 3 Encounters:  12/28/19 88  12/19/19 (!) 114  11/15/19 (!) 137   Pain Assessment Pain Score: 0-No pain/10  Physical exam not performed in light of telephone consult visit format.   KPS = 90  100 - Normal; no complaints; no evidence of disease. 90   - Able to carry on normal activity; minor signs or symptoms of disease. 80   - Normal activity with effort; some signs or symptoms of disease. 57   - Cares for self; unable to carry on normal activity or to do active work. 60   - Requires occasional assistance, but is able to care for most of his personal needs. 50   - Requires considerable assistance and frequent medical care. 33   - Disabled; requires special care and assistance. 72   - Severely disabled; hospital admission is indicated although death not imminent. 74   - Very sick; hospital admission necessary;  active supportive treatment necessary. 10   - Moribund; fatal processes progressing rapidly. 0     - Dead  Karnofsky DA, Abelmann [place], Craver LS and Burchenal [facility] [phone]) The use of the nitrogen mustards in the palliative treatment of carcinoma: with particular reference to bronchogenic carcinoma Castillo 1 [phone]  LABORATORY DATA:  Lab Results  Component Value Date   WBC 7.0 12/12/2019   HGB 13.3 12/12/2019   HCT 41.4 12/12/2019   MCV 89.0 12/12/2019   PLT 293 12/12/2019   Lab Results  Component Value Date   NA 137 12/12/2019   K 4.4 12/12/2019   CL 101 12/12/2019   CO2 24 12/12/2019   Lab Results  Component Value Date   ALT 20 12/12/2019   AST 17 12/12/2019   ALKPHOS 48 12/12/2019   BILITOT 0.6 12/12/2019     RADIOGRAPHY: No results found.    IMPRESSION/PLAN: This visit was conducted via Telephone to spare the patient unnecessary potential exposure in the healthcare setting during the current COVID-19 pandemic. 1. 69 y.o. gentleman with Stage T1c adenocarcinoma of the prostate with Gleason Score of 4+3, and PSA of 5.91. We discussed the patient's workup and outlined the nature of prostate Castillo in this  setting. The patient's T stage, Gleason's score, and PSA put him into the unfavorable intermediate risk group. Accordingly, he is eligible for a variety of potential treatment options including brachytherapy, 5.5 weeks of external radiation, or prostatectomy. We discussed the available radiation techniques, and focused on the details and logistics of delivery. We discussed and outlined the risks, benefits, short and long-term effects associated with radiotherapy and compared and contrasted these with prostatectomy. We discussed the role of SpaceOAR in reducing the rectal toxicity associated with radiotherapy. He appears to have a good understanding of his disease and our treatment recommendations which are of curative intent.  He was encouraged to ask questions that were answered to his  stated satisfaction.  At the end of the conversation, the patient is interested in moving forward with brachytherapy and use of SpaceOAR gel to reduce rectal toxicity from radiotherapy. His care will be transferred to Dr. Abner Greenspan due to Dr. Purvis Sheffield unanticipated absence. We will share our discussion with Dr. Abner Greenspan and move forward with scheduling his [facility] planning appointment in the near future.  The patient will be contacted by Romie Jumper in our office who will be working closely with him to coordinate OR scheduling and pre and post procedure appointments.  We will contact the pharmaceutical rep to ensure that [place] is available at the time of procedure.  We enjoyed meeting him today and look forward to continuing to participate in his care.    Given current concerns for patient exposure during the COVID-19 pandemic, this encounter was conducted via telephone. The patient was notified in advance and was offered a MyChart meeting to allow for face to face communication but unfortunately reported that he did not have the appropriate resources/technology to support such a visit and instead preferred to proceed with telephone consult. The patient has given verbal consent for this type of encounter. The time spent during this encounter was 60 minutes. The attendants for this meeting include Tyler Pita MD, Ashlyn Bruning PA-C, Echo, and patient, Frank Castillo. During the encounter, Tyler Pita MD, Ashlyn Bruning PA-C, and scribe, Wilburn Mylar were located at [place].  Patient, Frank Castillo was located in his car.    Nicholos Johns, PA-C    Tyler Pita, MD  [facility] Direct Dial: [phone]  Fax: [phone] [email or web address]  Skype  [email or web address]  This document serves as a record of services personally performed by Tyler Pita, MD and Freeman Caldron, PA-C. It was created on their  behalf by Wilburn Mylar, a trained medical scribe. The creation of this record is based on the scribe's personal observations and the provider's statements to them. This document has been checked and approved by the attending provider.

## 2020-04-05 NOTE — Progress Notes (Signed)
GU Location of Tumor / Histology: prostatic adenocarcinoma  If Prostate Cancer, Gleason Score is (4 + 3) and PSA is (5.91). Prostate volume: 48.66 g  [name] with a history of multiple cancers. PCP, Dr. Mannie Stabile, noted an elevated psa of 5.8 on 10/17/2019 and referred patient to Dr. Gloriann Loan for further evaluation.  Biopsies of prostate (if applicable) revealed:   Past/Anticipated interventions by urology, if any: prostate biopsy, referral to Dr. Tammi Klippel to discuss radiotherapeutic options.   Past/Anticipated interventions by medical oncology, if any:   Weight changes, if any: denies  Bowel/Bladder complaints, if any: IPSS 13. SHIM 21. Denies dysuria or hematuria. Denies urinary leakage. Reports urinary urgency.Endorses taking Flomax.  Denies any bowel complaints  Nausea/Vomiting, if any: denies  Pain issues, if any:  denies  SAFETY ISSUES:  Prior radiation? Yes, under the care of Dr. Isidore Moos.  Pacemaker/ICD? denies  Possible current pregnancy? no, male patient  Is the patient on methotrexate? denies   Current Complaints / other details:  69 year old male. Divorced. Mother who has dementia resides with him. Hx of multiple abdominal surgeries.

## 2020-04-08 ENCOUNTER — Telehealth: Payer: Self-pay | Admitting: *Deleted

## 2020-04-08 ENCOUNTER — Telehealth: Payer: Self-pay | Admitting: Internal Medicine

## 2020-04-08 DIAGNOSIS — K7030 Alcoholic cirrhosis of liver without ascites: Secondary | ICD-10-CM

## 2020-04-08 NOTE — Telephone Encounter (Signed)
CALLED PATIENT TO ASK QUESTIONS, SPOKE WITH PATIENT 

## 2020-04-08 NOTE — Telephone Encounter (Signed)
Called pt. He is aware of his [place] appt details. He voiced understanding and had no questions

## 2020-04-08 NOTE — Telephone Encounter (Signed)
PATIENT RECEIVED LETTER TO SCHEDULE ULTRASOUND 

## 2020-04-08 NOTE — Addendum Note (Signed)
Addended by: Cheron Every on: 04/08/2020 09:32 AM   Modules accepted: Orders

## 2020-04-11 DIAGNOSIS — Z85818 Personal history of malignant neoplasm of other sites of lip, oral cavity, and pharynx: Secondary | ICD-10-CM | POA: Diagnosis not present

## 2020-04-11 DIAGNOSIS — Z6825 Body mass index (BMI) 25.0-25.9, adult: Secondary | ICD-10-CM | POA: Diagnosis not present

## 2020-04-11 DIAGNOSIS — C099 Malignant neoplasm of tonsil, unspecified: Secondary | ICD-10-CM | POA: Diagnosis not present

## 2020-04-11 DIAGNOSIS — K7030 Alcoholic cirrhosis of liver without ascites: Secondary | ICD-10-CM | POA: Diagnosis not present

## 2020-04-11 DIAGNOSIS — Z08 Encounter for follow-up examination after completed treatment for malignant neoplasm: Secondary | ICD-10-CM | POA: Diagnosis not present

## 2020-04-16 ENCOUNTER — Other Ambulatory Visit: Payer: Self-pay

## 2020-04-16 ENCOUNTER — Ambulatory Visit (HOSPITAL_COMMUNITY)
Admission: RE | Admit: 2020-04-16 | Discharge: 2020-04-16 | Disposition: A | Discharge status: Home / Self Care | Payer: Medicare Other | Source: Ambulatory Visit | Attending: Internal Medicine | Admitting: Internal Medicine

## 2020-04-16 DIAGNOSIS — K7030 Alcoholic cirrhosis of liver without ascites: Secondary | ICD-10-CM

## 2020-04-16 DIAGNOSIS — K760 Fatty (change of) liver, not elsewhere classified: Secondary | ICD-10-CM | POA: Diagnosis not present

## 2020-04-16 DIAGNOSIS — K8020 Calculus of gallbladder without cholecystitis without obstruction: Secondary | ICD-10-CM | POA: Diagnosis not present

## 2020-04-22 ENCOUNTER — Encounter: Payer: Self-pay | Admitting: Medical Oncology

## 2020-04-22 DIAGNOSIS — F1911 Other psychoactive substance abuse, in remission: Secondary | ICD-10-CM | POA: Diagnosis not present

## 2020-04-22 DIAGNOSIS — I10 Essential (primary) hypertension: Secondary | ICD-10-CM | POA: Diagnosis not present

## 2020-04-22 DIAGNOSIS — Z720 Tobacco use: Secondary | ICD-10-CM | POA: Diagnosis not present

## 2020-04-22 DIAGNOSIS — L309 Dermatitis, unspecified: Secondary | ICD-10-CM | POA: Diagnosis not present

## 2020-04-22 DIAGNOSIS — Z23 Encounter for immunization: Secondary | ICD-10-CM | POA: Diagnosis not present

## 2020-04-22 DIAGNOSIS — E782 Mixed hyperlipidemia: Secondary | ICD-10-CM | POA: Diagnosis not present

## 2020-04-22 DIAGNOSIS — F321 Major depressive disorder, single episode, moderate: Secondary | ICD-10-CM | POA: Diagnosis not present

## 2020-04-22 DIAGNOSIS — R7303 Prediabetes: Secondary | ICD-10-CM | POA: Diagnosis not present

## 2020-04-22 DIAGNOSIS — I2510 Atherosclerotic heart disease of native coronary artery without angina pectoris: Secondary | ICD-10-CM | POA: Diagnosis not present

## 2020-04-22 DIAGNOSIS — M25371 Other instability, right ankle: Secondary | ICD-10-CM | POA: Diagnosis not present

## 2020-04-22 NOTE — Progress Notes (Signed)
Spoke with Frank Castillo to introduce myself as the prostate nurse navigator and discuss my role. He has chosen brachytherapy as treatment for his prostate cancer. He is aware Enid Derry will be in contact with him to schedule surgery date.

## 2020-04-23 ENCOUNTER — Encounter: Payer: Self-pay | Admitting: Internal Medicine

## 2020-04-23 NOTE — Progress Notes (Signed)
On recall  °

## 2020-04-24 DIAGNOSIS — L111 Transient acantholytic dermatosis [Grover]: Secondary | ICD-10-CM | POA: Diagnosis not present

## 2020-04-24 DIAGNOSIS — L2082 Flexural eczema: Secondary | ICD-10-CM | POA: Diagnosis not present

## 2020-04-30 DIAGNOSIS — Z23 Encounter for immunization: Secondary | ICD-10-CM | POA: Diagnosis not present

## 2020-05-07 ENCOUNTER — Encounter: Payer: Self-pay | Admitting: *Deleted

## 2020-05-07 ENCOUNTER — Encounter: Payer: Self-pay | Admitting: Cardiology

## 2020-05-07 ENCOUNTER — Telehealth: Payer: Self-pay | Admitting: Cardiology

## 2020-05-07 ENCOUNTER — Ambulatory Visit (INDEPENDENT_AMBULATORY_CARE_PROVIDER_SITE_OTHER): Payer: Medicare Other | Admitting: Cardiology

## 2020-05-07 VITALS — BP 120/74 | HR 84 | Ht 70.0 in | Wt 180.2 lb

## 2020-05-07 DIAGNOSIS — I779 Disorder of arteries and arterioles, unspecified: Secondary | ICD-10-CM

## 2020-05-07 DIAGNOSIS — I2510 Atherosclerotic heart disease of native coronary artery without angina pectoris: Secondary | ICD-10-CM | POA: Diagnosis not present

## 2020-05-07 NOTE — Progress Notes (Signed)
Cardiology Office Note  Date: 05/07/2020   ID: Frank Castillo, DOB 1951/04/02, MRN [id number]  PCP:  Caren Macadam, MD  Cardiologist:  Rozann Lesches, MD Electrophysiologist:  None   Chief Complaint  Patient presents with  . Coronary artery calcification    History of Present Illness: Frank Castillo is a 69 y.o. male referred for cardiology consultation by Dr. Mannie Stabile due to finding of coronary artery calcification by CT imaging.  He presents without any obvious angina symptoms with current level of activity.  He does admit that his stamina has not been what it used to be, complicated by chronic medical illness including two cancers with treatment, also a recovered alcoholic. He lives in [place] and is primary caregiver for his elderly mother.  He is retired from [employer].  I reviewed his medications, he is on aspirin, Lopressor, and lisinopril.  Today's ECG is normal.  CT imaging of his neck done at [facility] last year did indicate presence of carotid atherosclerosis, reportedly nonobstructive at that time.  Blood pressure control is good today.  His last LDL was 61.   Past Medical History:  Diagnosis Date  . Cirrhosis ([place])    Followed at [facility]; completed Hep A and B vaccine 05/2019  . Essential hypertension   . GERD (gastroesophageal reflux disease)   . History of alcohol abuse   . History of bowel resection   . History of tonsillectomy    Dr. Benjamine Mola, 06/2015  . Iron deficiency anemia 05/07/2016  . Melanoma ([place])   . Mixed hyperlipidemia   . Portal hypertension ([place])   . Prostate cancer ([place])   . 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 BIOPSY  07/02/2016   Procedure: BIOPSY;  Surgeon: Daneil Dolin, MD;  [facility];  Service: Endoscopy;;  esophageal  . COLON SURGERY    . COLONOSCOPY WITH PROPOFOL N/A 07/02/2016   Procedure: COLONOSCOPY WITH PROPOFOL;  Surgeon: Daneil Dolin, MD;   [facility];  Service: Endoscopy;  Laterality: N/A;  9:45am  . COLOSTOMY TAKEDOWN N/A 09/02/2016   Procedure: LAPAROSCOPIC COLOSTOMY REVERSAL;  Surgeon: Leighton Ruff, MD;  Location: [facility];  Service: General;  Laterality: N/A;  . ESOPHAGOGASTRODUODENOSCOPY (EGD) WITH PROPOFOL N/A 07/02/2016   Procedure: ESOPHAGOGASTRODUODENOSCOPY (EGD) WITH PROPOFOL;  Surgeon: Daneil Dolin, MD;  [facility];  Service: Endoscopy;  Laterality: N/A;  . ESOPHAGOGASTRODUODENOSCOPY (EGD) WITH PROPOFOL N/A 12/28/2019   Procedure: ESOPHAGOGASTRODUODENOSCOPY (EGD) WITH PROPOFOL;  Surgeon: Daneil Dolin, MD;  [facility];  Service: Endoscopy;  Laterality: N/A;  11:00am  . HERNIA REPAIR Right   . INCISIONAL HERNIA REPAIR  09/02/2016   Procedure: HERNIA REPAIR INCISIONAL;  Surgeon: Leighton Ruff, MD;  [facility];  Service: General;;  . LAPAROTOMY N/A 03/28/2016   Procedure: EXPLORATORY LAPAROTOMY, SIGMOID COLECTOMY, COLOSTOMY;  Surgeon: Leighton Ruff, MD;  [facility];  Service: General;  Laterality: N/A;  [name] DILATION N/A 07/02/2016   Procedure: Keturah Shavers;  Surgeon: Daneil Dolin, MD;  [facility];  Service: Endoscopy;  Laterality: N/A;  . MOUTH SURGERY    . PORT-A-CATH REMOVAL  09/02/2016   Procedure: REMOVAL PORT-A-CATH;  Surgeon: Leighton Ruff, MD;  [facility];  Service: General;;  . PROSTATE BIOPSY    . REMOVAL OF GASTROSTOMY TUBE N/A 09/02/2016   Procedure: REMOVAL OF GASTROSTOMY TUBE;  Surgeon: Leighton Ruff, MD;  [facility];  Service: General;  Laterality: N/A;  . TONSILLECTOMY Left 07/09/2015   Procedure: LEFT TONSILLECTOMY;  Surgeon: Leta Baptist, MD;  Location: [place];  Service: ENT;  Laterality: Left;  . TONSILLECTOMY    . Z-PLASTY SCAR REVISION N/A 09/02/2016   Procedure: SCAR REVISION;  Surgeon: Leighton Ruff, MD;  [facility];  Service: General;  Laterality: N/A;    Current Outpatient Medications  Medication  Sig Dispense Refill  . aspirin EC 81 MG tablet Take 1 tablet (81 mg total) by mouth daily.    Marland Kitchen atorvastatin (LIPITOR) 40 MG tablet Take 1 tablet (40 mg total) by mouth daily. 90 tablet 0  . cetirizine (ZYRTEC) 10 MG tablet Take 10 mg by mouth daily as needed for allergies.     . CHANTIX STARTING MONTH PAK 0.5 MG X 11 & 1 MG X 42 tablet Take 1 mg by mouth 2 (two) times daily.    . chlorhexidine (PERIDEX) 0.12 % solution 15 mLs 2 (two) times daily.    Marland Kitchen dexlansoprazole (DEXILANT) 60 MG capsule Take 1 capsule (60 mg total) by mouth daily. 30 capsule 2  . DULoxetine (CYMBALTA) 60 MG capsule Take 120 mg by mouth daily.    . fluticasone (FLONASE) 50 MCG/ACT nasal spray Place 2 sprays into both nostrils daily as needed for allergies or rhinitis.    Marland Kitchen lisinopril (PRINIVIL,ZESTRIL) 10 MG tablet Take 1 tablet (10 mg total) by mouth daily. 90 tablet 3  . Melatonin 10 MG TABS Take 1 tablet by mouth as needed.     . MULTIPLE VITAMINS-MINERALS ER PO Take 1 tablet by mouth daily.     . sodium fluoride (FLUORISHIELD) 1.1 % GEL dental gel Instill one drop of gel per tooth space of fluoride tray. Place over teeth for 5 minutes. Remove. Spit out excess. Repeat nightly. 125 mL 11  . tamsulosin (FLOMAX) 0.4 MG CAPS capsule Take 0.4 mg by mouth daily.    Marland Kitchen triamcinolone cream (KENALOG) 0.1 % Apply 1 application topically 2 (two) times daily as needed (itching).   2   No current facility-administered medications for this visit.   Facility-Administered Medications Ordered in Other Visits  Medication Dose Route Frequency Provider Last Rate Last Admin  . 0.9 %  sodium chloride infusion   Intravenous Continuous Baird Cancer, PA-C   New Bag at 03/28/16 1754  . 0.9 %  sodium chloride infusion   Intravenous Continuous Kefalas, Thomas S, PA-C      . 0.9 %  sodium chloride infusion   Intravenous Continuous Penland, Larene Beach K, MD      . 0.9 %  sodium chloride infusion   Intravenous Continuous Penland, Larene Beach K, MD       . 0.9 %  sodium chloride infusion   Intravenous Continuous Penland, Kelby Fam, MD       Allergies:  Bee venom and Other   Social History: The patient  reports that he has been smoking cigarettes. He has a 15.00 pack-year smoking history. He has never used smokeless tobacco. He reports that he does not drink alcohol and does not use drugs.   Family History: The patient's family history includes Alcoholism in his father; Dementia in his mother; Diabetes in his maternal aunt and maternal uncle; Macular degeneration in his mother; Mental illness in his sister; Prostate cancer in his father and maternal uncle.   ROS: No palpitations or syncope.  Physical Exam: VS:  BP 120/74   Pulse 84  Ht 5\' 10"  (1.778 m)   Wt 180 lb 3.2 oz (81.7 kg)   SpO2 98%   BMI 25.86 kg/m , BMI Body mass index is 25.86 kg/m.  Wt Readings from Last 3 Encounters:  05/07/20 180 lb 3.2 oz (81.7 kg)  04/05/20 181 lb (82.1 kg)  12/28/19 182 lb (82.6 kg)    General: Patient appears comfortable at rest. HEENT: Conjunctiva and lids normal, mask. Neck: Supple, no elevated JVP, left carotid bruit. Lungs: Clear to auscultation, nonlabored breathing at rest. Cardiac: Regular rate and rhythm, no S3, soft systolic murmur, no pericardial rub. Abdomen: Soft, nontender, bowel sounds present. Extremities: No pitting edema, distal pulses 2+. Skin: Warm and dry. Musculoskeletal: No kyphosis. Neuropsychiatric: Alert and oriented x3, affect grossly appropriate.  ECG:  An ECG dated 01/19/2018 was personally reviewed today and demonstrated:  Normal sinus rhythm.  Recent Labwork: 12/12/2019: ALT 20; AST 17; BUN 12; Creatinine, Ser 0.81; Hemoglobin 13.3; Magnesium 2.0; Platelets 293; Potassium 4.4; Sodium 137; TSH 2.589     Component Value Date/Time   CHOL 109 01/20/2018 0802   TRIG 106 01/20/2018 0802   HDL 36 (L) 01/20/2018 0802   CHOLHDL 3.0 01/20/2018 0802   LDLCALC 54 01/20/2018 0802  August 2021: Hemoglobin A1c 6.2%   May 2021: Cholesterol 140, HDL 39, triglycerides 252, LDL 61   Other Studies Reviewed Today:  Chest CT 12/08/2019: FINDINGS: Cardiovascular: Normal heart size. No significant pericardial effusion/thickening. Left anterior descending and right coronary atherosclerosis. Atherosclerotic nonaneurysmal thoracic aorta. Normal caliber pulmonary arteries.  Mediastinum/Nodes: No discrete thyroid nodules. Unremarkable esophagus. No pathologically enlarged axillary, mediastinal or hilar lymph nodes, noting limited sensitivity for the detection of hilar adenopathy on this noncontrast study.  Lungs/Pleura: No pneumothorax. No pleural effusion. Mild centrilobular emphysema with mild diffuse bronchial wall thickening. No acute consolidative airspace disease or lung masses. Symmetric mild biapical lung fibrosis, probably radiation related. Tiny calcified right pulmonary granuloma. No significant pulmonary nodules.  Upper abdomen: No acute abnormality.  Musculoskeletal: No aggressive appearing focal osseous lesions. Mild thoracic spondylosis.  IMPRESSION: 1. Lung-RADS 1, negative. Continue annual screening with low-dose chest CT without contrast in 12 months. 2. Two-vessel coronary atherosclerosis. 3. Aortic Atherosclerosis (ICD10-I70.0) and Emphysema (ICD10-J43.9).  Assessment and Plan:  1.  Coronary artery calcification by CT imaging back in July.  Baseline ECG is normal.  Reports lack of stamina in the setting of chronic medical illness, although no definite angina symptoms.  We will obtain a Lexiscan Myoview to assess ischemic burden, otherwise plan medical therapy for risk factor reduction and observation.  He is already on aspirin, Lipitor, and lisinopril.  Last LDL was 61.  2.  Carotid artery disease evident by prior CT imaging.  Carotid Dopplers will be obtained.   Medication Adjustments/Labs and Tests Ordered: Current medicines are reviewed at length with the patient today.   Concerns regarding medicines are outlined above.   Tests Ordered: Orders Placed This Encounter  Procedures  . EKG 12-Lead    Medication Changes: No orders of the defined types were placed in this encounter.   Disposition:  Follow up test results, anticipate annual follow-up.  Signed, Satira Sark, MD, [facility] 05/07/2020 3:15 PM    [facility] [place], [place], [place] [address] Phone: [phone]; Fax: [phone]

## 2020-05-07 NOTE — Patient Instructions (Signed)
Medication Instructions:   Your physician recommends that you continue on your current medications as directed. Please refer to the Current Medication list given to you today.  Labwork:  None  Testing/Procedures: Your physician has requested that you have a carotid duplex. This test is an ultrasound of the carotid arteries in your neck. It looks at blood flow through these arteries that supply the brain with blood. Allow one hour for this exam. There are no restrictions or special instructions. Your physician has requested that you have a lexiscan myoview. For further information please visit [email or web address]. Please follow instruction sheet, as given.  Follow-Up:  Your physician recommends that you schedule a follow-up appointment in: 1 year. You will receive a reminder letter in the mail in about 10 months reminding you to call and schedule your appointment. If you don't receive this letter, please contact our office.  Any Other Special Instructions Will Be Listed Below (If Applicable).  If you need a refill on your cardiac medications before your next appointment, please call your pharmacy.

## 2020-05-07 NOTE — Telephone Encounter (Signed)
Pre-cert Verification for the following procedure    LEXISCAN MYOVIEW  DATE:   05/14/2020   LOCATION:  [facility]

## 2020-05-08 ENCOUNTER — Ambulatory Visit: Payer: Medicare Other | Admitting: Orthopaedic Surgery

## 2020-05-08 ENCOUNTER — Other Ambulatory Visit: Payer: Self-pay

## 2020-05-14 ENCOUNTER — Encounter (HOSPITAL_COMMUNITY): Payer: Self-pay

## 2020-05-14 ENCOUNTER — Ambulatory Visit (HOSPITAL_COMMUNITY)
Admission: RE | Admit: 2020-05-14 | Discharge: 2020-05-14 | Disposition: A | Discharge status: Home / Self Care | Payer: Medicare Other | Source: Ambulatory Visit | Attending: Cardiology | Admitting: Cardiology

## 2020-05-14 ENCOUNTER — Encounter (HOSPITAL_COMMUNITY)
Admission: RE | Admit: 2020-05-14 | Discharge: 2020-05-14 | Disposition: A | Discharge status: Home / Self Care | Payer: Medicare Other | Source: Ambulatory Visit | Attending: Cardiology | Admitting: Cardiology

## 2020-05-14 ENCOUNTER — Other Ambulatory Visit: Payer: Self-pay

## 2020-05-14 DIAGNOSIS — I2510 Atherosclerotic heart disease of native coronary artery without angina pectoris: Secondary | ICD-10-CM | POA: Diagnosis not present

## 2020-05-14 LAB — NM MYOCAR MULTI W/SPECT W/WALL MOTION / EF
LV dias vol: 79 mL (ref 62–150)
LV sys vol: 29 mL
Peak HR: 90 {beats}/min
RATE: 0.3
Rest HR: 69 {beats}/min
SDS: 0
SRS: 4
SSS: 4
TID: 1.11

## 2020-05-14 MED ORDER — TECHNETIUM TC 99M TETROFOSMIN IV KIT
30.0000 | PACK | Freq: Once | INTRAVENOUS | Status: AC | PRN
  Administered 2020-05-14: 32 via INTRAVENOUS

## 2020-05-14 MED ORDER — SODIUM CHLORIDE FLUSH 0.9 % IV SOLN
INTRAVENOUS | Status: AC
  Filled 2020-05-14: qty 10

## 2020-05-14 MED ORDER — TECHNETIUM TC 99M TETROFOSMIN IV KIT
10.0000 | PACK | Freq: Once | INTRAVENOUS | Status: AC | PRN
  Administered 2020-05-14: 10.2 via INTRAVENOUS

## 2020-05-14 MED ORDER — REGADENOSON 0.4 MG/5ML IV SOLN
INTRAVENOUS | Status: AC
  Filled 2020-05-14: qty 5

## 2020-05-15 ENCOUNTER — Other Ambulatory Visit: Payer: Self-pay | Admitting: Urology

## 2020-05-15 ENCOUNTER — Ambulatory Visit (INDEPENDENT_AMBULATORY_CARE_PROVIDER_SITE_OTHER): Payer: Medicare Other

## 2020-05-15 DIAGNOSIS — I779 Disorder of arteries and arterioles, unspecified: Secondary | ICD-10-CM

## 2020-05-15 DIAGNOSIS — I6523 Occlusion and stenosis of bilateral carotid arteries: Secondary | ICD-10-CM

## 2020-05-15 DIAGNOSIS — C61 Malignant neoplasm of prostate: Secondary | ICD-10-CM | POA: Diagnosis not present

## 2020-05-16 ENCOUNTER — Telehealth: Payer: Self-pay | Admitting: *Deleted

## 2020-05-16 NOTE — Telephone Encounter (Signed)
-----   Message from Satira Sark, MD sent at 05/15/2020  3:42 PM EST ----- Results reviewed. Carotid Dopplers show only 1-39% bilateral ICA stenosis. Continue ASA and Lipitor.

## 2020-05-16 NOTE — Telephone Encounter (Signed)
Patient informed. Copy sent to PCP °

## 2020-05-16 NOTE — Telephone Encounter (Signed)
-----   Message from Satira Sark, MD sent at 05/14/2020  4:09 PM EST ----- Results reviewed.  Myoview was low risk, no ischemic territories to suggest obstructive CAD.  Continue medical therapy for treatment of coronary calcification.  We can see him back in 1 year assuming no interval development of symptoms.

## 2020-05-20 ENCOUNTER — Telehealth: Payer: Self-pay | Admitting: Radiation Oncology

## 2020-05-20 ENCOUNTER — Telehealth: Payer: Self-pay | Admitting: *Deleted

## 2020-05-20 NOTE — Telephone Encounter (Signed)
Received voicemail message from patient requesting a return call to obtain clarity about upcoming appointments. Routed message to Romie Jumper, scheduler, and sent inbasket Epic message requesting she contact patient.

## 2020-05-20 NOTE — Telephone Encounter (Signed)
Returned patient's phone call, spoke with patient 

## 2020-05-20 NOTE — Telephone Encounter (Signed)
Returned patient's phone call, lvm for a return call 

## 2020-05-22 ENCOUNTER — Telehealth: Payer: Self-pay | Admitting: *Deleted

## 2020-05-22 NOTE — Telephone Encounter (Signed)
Called patient to remind of pre-seed appts. for 05-23-20, lvm for a return call

## 2020-05-23 ENCOUNTER — Ambulatory Visit
Admission: RE | Admit: 2020-05-23 | Discharge: 2020-05-23 | Disposition: A | Discharge status: Home / Self Care | Payer: Medicare Other | Source: Ambulatory Visit | Attending: Urology | Admitting: Urology

## 2020-05-23 ENCOUNTER — Other Ambulatory Visit: Payer: Self-pay

## 2020-05-23 ENCOUNTER — Ambulatory Visit (HOSPITAL_COMMUNITY)
Admission: RE | Admit: 2020-05-23 | Discharge: 2020-05-23 | Disposition: A | Discharge status: Home / Self Care | Payer: Medicare Other | Source: Ambulatory Visit | Attending: Urology | Admitting: Urology

## 2020-05-23 ENCOUNTER — Encounter (HOSPITAL_COMMUNITY)
Admission: RE | Admit: 2020-05-23 | Discharge: 2020-05-23 | Disposition: A | Discharge status: Home / Self Care | Payer: Medicare Other | Source: Ambulatory Visit | Attending: Urology | Admitting: Urology

## 2020-05-23 ENCOUNTER — Ambulatory Visit
Admission: RE | Admit: 2020-05-23 | Discharge: 2020-05-23 | Disposition: A | Discharge status: Home / Self Care | Payer: Medicare Other | Source: Ambulatory Visit | Attending: Radiation Oncology | Admitting: Radiation Oncology

## 2020-05-23 DIAGNOSIS — C61 Malignant neoplasm of prostate: Secondary | ICD-10-CM | POA: Insufficient documentation

## 2020-05-23 DIAGNOSIS — Z01818 Encounter for other preprocedural examination: Secondary | ICD-10-CM | POA: Diagnosis not present

## 2020-05-23 DIAGNOSIS — J439 Emphysema, unspecified: Secondary | ICD-10-CM | POA: Diagnosis not present

## 2020-05-23 NOTE — Progress Notes (Signed)
  Radiation Oncology         [phone]) [phone] ________________________________  Name: Frank Castillo MRN: [id number]  Date: 05/23/2020  DOB: 08-03-1950  SIMULATION AND TREATMENT PLANNING NOTE PUBIC ARCH STUDY  [id number], Fleet Contras, MD  Crista Elliot, MD  DIAGNOSIS: 69 y.o. gentleman with Stage T1c adenocarcinoma of the prostate with Gleason score of 4+3, and PSA of 5.91.  Oncology History  Tonsil cancer (HCC)  08/02/2015 Initial Diagnosis   Cancer of tonsillar fossa (HCC)   08/20/2015 Procedure   Port-a-cath placed by IR   08/20/2015 Procedure   G-tube placed by IR   08/21/2015 - 10/09/2015 Radiation Therapy   70 Gy, Dr. Basilio Cairo.   08/21/2015 - 09/10/2015 Chemotherapy   Cisplatin every 21 days with XRT.   08/28/2015 Adverse Reaction   Tinnitis, Cisplatin-induced.   09/11/2015 - 09/30/2015 Chemotherapy   Carboplatin/5FU   09/20/2015 - 09/21/2015 Hospital Admission   Intractable nausea/vomiting. Severe Mucositis   10/17/2015 - 10/20/2015 Hospital Admission   Esophageal/oral candidasis. Shingles. Fever. Chemotherapy induced pancytopenia   01/24/2016 PET scan   Resolution of the left tonsillar mass and left neck adenopathy. No findings for residual or locally recurrent tumor, adenopathy or distant metastatic disease.   03/28/2016 - 04/08/2016 Hospital Admission   Admitted with severe abdominal pain. Found to diffuse peritonitis from perforated sigmoid colon requiring colostomy. Discharged to [facility] in [place].           ICD-10-CM   1. Malignant neoplasm of prostate (HCC)  C61     COMPLEX SIMULATION:  The patient presented today for evaluation for possible prostate seed implant. He was brought to the radiation planning suite and placed supine on the CT couch. A 3-dimensional image study set was obtained in upload to the planning computer. There, on each axial slice, I contoured the prostate gland. Then, using three-dimensional radiation planning tools I reconstructed the prostate  in view of the structures from the transperineal needle pathway to assess for possible pubic arch interference. In doing so, I did not appreciate any pubic arch interference. Also, the patient's prostate volume was estimated based on the drawn structure. The volume was 49 cc.  Given the pubic arch appearance and prostate volume, patient remains a good candidate to proceed with prostate seed implant. Today, he freely provided informed written consent to proceed.    PLAN: The patient will undergo prostate seed implant.   ________________________________  Artist Pais. Kathrynn Running, M.D.

## 2020-05-28 DIAGNOSIS — H524 Presbyopia: Secondary | ICD-10-CM | POA: Diagnosis not present

## 2020-05-28 DIAGNOSIS — H52223 Regular astigmatism, bilateral: Secondary | ICD-10-CM | POA: Diagnosis not present

## 2020-05-28 DIAGNOSIS — H2513 Age-related nuclear cataract, bilateral: Secondary | ICD-10-CM | POA: Diagnosis not present

## 2020-06-05 ENCOUNTER — Encounter: Payer: Self-pay | Admitting: Orthopaedic Surgery

## 2020-06-05 ENCOUNTER — Ambulatory Visit (INDEPENDENT_AMBULATORY_CARE_PROVIDER_SITE_OTHER): Payer: Medicare Other | Admitting: Orthopaedic Surgery

## 2020-06-05 ENCOUNTER — Other Ambulatory Visit: Payer: Self-pay

## 2020-06-05 DIAGNOSIS — G5731 Lesion of lateral popliteal nerve, right lower limb: Secondary | ICD-10-CM | POA: Diagnosis not present

## 2020-06-05 NOTE — Progress Notes (Signed)
Office Visit Note   Patient: Frank Castillo           Date of Birth: [birth year]           MRN: [id number] Visit Date: 06/05/2020              Requested by: Caren Macadam, MD [address],  [place] [address] PCP: Caren Macadam, MD   Assessment & Plan: Visit Diagnoses:  1. Peroneal nerve palsy, right     Plan: Mr. Dambrosi has evidence of a peroneal nerve neuropraxia.  Started about 6 months ago without injury or trauma.  He had difficulty dorsiflexing his right foot with some pain along the lateral leg.  Presently able to dorsiflex his foot with good strength of the anterior tib but does have weakness of the peroneal nerves.  He has good sensation of the foot with +1 pulses.  Does have Tinel's along the peroneal nerve starting about the mid leg coursing distally.  I think this is limited to the peroneal nerve and should slowly resolve over time.  He does smoke.  He is recovering alcoholic but has not had anything to drink in over 9 years.  Not diabetic.  No longer dragging his foot.  Reevaluate in 6 weeks.  Consider EMGs and nerve conduction studies if he does not have aggressive improvement  Follow-Up Instructions: Return in about 6 weeks (around 07/17/2020).   Orders:  No orders of the defined types were placed in this encounter.  No orders of the defined types were placed in this encounter.     Procedures: No procedures performed   Clinical Data: No additional findings.   Subjective: Chief Complaint  Patient presents with  . Right Leg - Pain, Weakness  Patient presents today for right lower leg pain and weakness. He said that it all started around 6-57months ago. He initially had right buttock pain that went away. He then had right lateral lower leg pain at night. And then finally a month after his onset of pain he noticed that he had difficulty lifting his right foot from the ground. He has seen a chiropractor and states that it did help some. He takes over the  counter pain medicine as needed. No longer having any pain.  He is able to dorsiflex his foot but notes that it "turns in".  Has good sensation to the foot  HPI  Review of Systems   Objective: Vital Signs: Ht 5\' 10"  (1.778 m)   Wt 180 lb (81.6 kg)   BMI 25.83 kg/m   Physical Exam Constitutional:      Appearance: He is well-developed and well-nourished.  HENT:     Mouth/Throat:     Mouth: Oropharynx is clear and moist.  Eyes:     Extraocular Movements: EOM normal.     Pupils: Pupils are equal, round, and reactive to light.  Pulmonary:     Effort: Pulmonary effort is normal.  Skin:    General: Skin is warm and dry.  Neurological:     Mental Status: He is alert and oriented to person, place, and time.  Psychiatric:        Mood and Affect: Mood and affect normal.        Behavior: Behavior normal.     Ortho Exam straight leg raise negative bilaterally.  No percussible tenderness to lumbar spine and no pain about the right hip.  Has good strength with anterior tib function right foot but does have weakened peroneal  musculature.  Is good sensation to the dorsum and plantar aspect of the foot.  Foot is warm.  This is of Tinel's along the peroneal nerve beginning about the mid lateral leg coursing distally.  Tinel's left lower extremity.  No pain along the peroneal nerve and, specifically, as it courses around the fibular head. Specialty Comments:  No specialty comments available.  Imaging: No results found.   PMFS History: Patient Active Problem List   Diagnosis Date Noted  . Peroneal nerve palsy, right 06/05/2020  . Malignant neoplasm of prostate ([place]) 04/05/2020  . 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 GERD (gastroesophageal reflux disease) 09/13/2017  . SCC (squamous cell carcinoma) 08/27/2017  . 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 Hyperlipidemia, mixed 06/17/2017   . Incisional hernia, without obstruction or gangrene 04/09/2017  . Melanoma ([place])   . Hernia of abdominal wall 01/22/2017  . Anxiety 01/22/2017  . Depression, major, single episode, in partial remission ([place]) 01/22/2017  . History of alcohol abuse 01/22/2017  . Esophagitis 09/29/2016  . 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 Odynophagia 06/29/2016  . Colon cancer screening 06/29/2016  . Dysphagia 06/29/2016  . H/O colostomy 06/29/2016  . Iron deficiency anemia 05/07/2016  . Perforated sigmoid colon ([place]) 03/28/2016  . Reactive depression (situational) 02/29/2016  . 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 Tobacco use disorder 02/27/2016  . Tinnitus 02/27/2016  . Dysuria 10/25/2015  . Decreased oral intake 10/25/2015  . Malnutrition of moderate degree 10/19/2015  . Antineoplastic chemotherapy induced pancytopenia ([place]) 10/18/2015  . Fever   . Candidiasis of mouth and esophagus ([place]) 10/17/2015  . Neutropenia, febrile ([place]) 10/17/2015  . Shingles rash 10/17/2015  . Nausea with vomiting 09/20/2015  . Hyponatremia 09/20/2015  . Oral pharyngeal candidiasis 09/20/2015  . Antineoplastic chemotherapy induced anemia 09/20/2015  . Hypertension   . Portal hypertension ([place])   . Tonsil cancer ([place]) 08/02/2015   Past Medical History:  Diagnosis Date  . Cirrhosis ([place])    Followed at [facility]; completed Hep A and B vaccine 05/2019  . Essential hypertension   . GERD (gastroesophageal reflux disease)   . History of alcohol abuse   . History of bowel resection   . History of tonsillectomy    Dr. Benjamine Mola, 06/2015  . Iron deficiency anemia 05/07/2016  . Melanoma ([place])   . Mixed hyperlipidemia   . Portal hypertension ([place])   . Prostate cancer ([place])   . 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 Macular degeneration Mother   . Dementia Mother   . Alcoholism Father   . Prostate cancer Father   . Diabetes Maternal Aunt   . Diabetes  Maternal Uncle   . Prostate cancer Maternal Uncle   . Mental illness Sister   . Pancreatic cancer Neg Hx   . Breast cancer Neg Hx   . Colon cancer Neg Hx     Past Surgical History:  Procedure Laterality Date  . BIOPSY  07/02/2016   Procedure: BIOPSY;  Surgeon: Daneil Dolin, MD;  [facility];  Service: Endoscopy;;  esophageal  . COLON SURGERY    . COLONOSCOPY WITH PROPOFOL N/A 07/02/2016   Procedure: COLONOSCOPY WITH PROPOFOL;  Surgeon: Daneil Dolin, MD;  [facility];  Service: Endoscopy;  Laterality: N/A;  9:45am  . COLOSTOMY TAKEDOWN N/A  09/02/2016   Procedure: LAPAROSCOPIC COLOSTOMY REVERSAL;  Surgeon: Leighton Ruff, MD;  [facility];  Service: General;  Laterality: N/A;  . ESOPHAGOGASTRODUODENOSCOPY (EGD) WITH PROPOFOL N/A 07/02/2016   Procedure: ESOPHAGOGASTRODUODENOSCOPY (EGD) WITH PROPOFOL;  Surgeon: Daneil Dolin, MD;  [facility];  Service: Endoscopy;  Laterality: N/A;  . ESOPHAGOGASTRODUODENOSCOPY (EGD) WITH PROPOFOL N/A 12/28/2019   Procedure: ESOPHAGOGASTRODUODENOSCOPY (EGD) WITH PROPOFOL;  Surgeon: Daneil Dolin, MD;  [facility];  Service: Endoscopy;  Laterality: N/A;  11:00am  . HERNIA REPAIR Right   . INCISIONAL HERNIA REPAIR  09/02/2016   Procedure: HERNIA REPAIR INCISIONAL;  Surgeon: Leighton Ruff, MD;  [facility];  Service: General;;  . LAPAROTOMY N/A 03/28/2016   Procedure: EXPLORATORY LAPAROTOMY, SIGMOID COLECTOMY, COLOSTOMY;  Surgeon: Leighton Ruff, MD;  [facility];  Service: General;  Laterality: N/A;  [name] DILATION N/A 07/02/2016   Procedure: Keturah Shavers;  Surgeon: Daneil Dolin, MD;  [facility];  Service: Endoscopy;  Laterality: N/A;  . MOUTH SURGERY    . PORT-A-CATH REMOVAL  09/02/2016   Procedure: REMOVAL PORT-A-CATH;  Surgeon: Leighton Ruff, MD;  [facility];  Service: General;;  . PROSTATE BIOPSY    . REMOVAL OF GASTROSTOMY TUBE N/A 09/02/2016   Procedure: REMOVAL OF  GASTROSTOMY TUBE;  Surgeon: Leighton Ruff, MD;  [facility];  Service: General;  Laterality: N/A;  . TONSILLECTOMY Left 07/09/2015   Procedure: LEFT TONSILLECTOMY;  Surgeon: Leta Baptist, MD;  Location: [facility];  Service: ENT;  Laterality: Left;  . TONSILLECTOMY    . Z-PLASTY SCAR REVISION N/A 09/02/2016   Procedure: SCAR REVISION;  Surgeon: Leighton Ruff, MD;  [facility];  Service: General;  Laterality: N/A;   Social History   Occupational History  . Not on file  Tobacco Use  . Smoking status: Current Some Day Smoker    Packs/day: 0.50    Years: 30.00    Pack years: 15.00    Types: Cigarettes    Last attempt to quit: 11/22/2016    Years since quitting: 3.5  . Smokeless tobacco: Never Used  Vaping Use  . Vaping Use: Never used  Substance and Sexual Activity  . Alcohol use: No    Alcohol/week: 0.0 standard drinks    Comment: former abuse- quit 09/2011  . Drug use: No  . Sexual activity: Not on file

## 2020-06-07 ENCOUNTER — Encounter: Payer: Self-pay | Admitting: Internal Medicine

## 2020-06-07 ENCOUNTER — Other Ambulatory Visit: Payer: Self-pay

## 2020-06-07 ENCOUNTER — Ambulatory Visit (INDEPENDENT_AMBULATORY_CARE_PROVIDER_SITE_OTHER): Payer: Medicare Other | Admitting: Internal Medicine

## 2020-06-07 VITALS — BP 139/83 | HR 113 | Temp 97.1°F | Ht 70.0 in | Wt 180.6 lb

## 2020-06-07 DIAGNOSIS — K2100 Gastro-esophageal reflux disease with esophagitis, without bleeding: Secondary | ICD-10-CM | POA: Diagnosis not present

## 2020-06-07 DIAGNOSIS — K7030 Alcoholic cirrhosis of liver without ascites: Secondary | ICD-10-CM | POA: Diagnosis not present

## 2020-06-07 NOTE — Progress Notes (Signed)
Primary Care Physician:  Caren Macadam, MD Primary Gastroenterologist:  Dr. Gala Romney  Pre-Procedure History & Physical: HPI:  Frank Castillo is a 70 y.o. male here for follow-up of a EtOH related cirrhosis.  Patient remains well compensated.  Historically low meld sodium (8).  Followed by Dr. Delton Coombes for head and neck cancer.  Recently diagnosed with prostate cancer. Radiation implants pending.  Not having any GI symptoms.  GERD well controlled on Dexilant.  No melena or hematochezia.  Denies abdominal pain.  He is up-to-date on screening ultrasounds (cholelithiasis; no focal hepatic lesion).  No varices on last year's EGD.  He has been vaccinated against hepatitis A and B. No alcohol.   Past Medical History:  Diagnosis Date  . Cirrhosis ([place])    Followed at [facility]; completed Hep A and B vaccine 05/2019  . Essential hypertension   . GERD (gastroesophageal reflux disease)   . History of alcohol abuse   . History of bowel resection   . History of tonsillectomy    Dr. Benjamine Mola, 06/2015  . Iron deficiency anemia 05/07/2016  . Melanoma ([place])   . Mixed hyperlipidemia   . Portal hypertension ([place])   . Prostate cancer ([place])   . 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 BIOPSY  07/02/2016   Procedure: BIOPSY;  Surgeon: Daneil Dolin, MD;  [facility];  Service: Endoscopy;;  esophageal  . COLON SURGERY    . COLONOSCOPY WITH PROPOFOL N/A 07/02/2016   Procedure: COLONOSCOPY WITH PROPOFOL;  Surgeon: Daneil Dolin, MD;  [facility];  Service: Endoscopy;  Laterality: N/A;  9:45am  . COLOSTOMY TAKEDOWN N/A 09/02/2016   Procedure: LAPAROSCOPIC COLOSTOMY REVERSAL;  Surgeon: Leighton Ruff, MD;  [facility];  Service: General;  Laterality: N/A;  . ESOPHAGOGASTRODUODENOSCOPY (EGD) WITH PROPOFOL N/A 07/02/2016   Procedure: ESOPHAGOGASTRODUODENOSCOPY (EGD) WITH PROPOFOL;  Surgeon: Daneil Dolin, MD;  [facility];   Service: Endoscopy;  Laterality: N/A;  . ESOPHAGOGASTRODUODENOSCOPY (EGD) WITH PROPOFOL N/A 12/28/2019   Procedure: ESOPHAGOGASTRODUODENOSCOPY (EGD) WITH PROPOFOL;  Surgeon: Daneil Dolin, MD;  [facility];  Service: Endoscopy;  Laterality: N/A;  11:00am  . HERNIA REPAIR Right   . INCISIONAL HERNIA REPAIR  09/02/2016   Procedure: HERNIA REPAIR INCISIONAL;  Surgeon: Leighton Ruff, MD;  [facility];  Service: General;;  . LAPAROTOMY N/A 03/28/2016   Procedure: EXPLORATORY LAPAROTOMY, SIGMOID COLECTOMY, COLOSTOMY;  Surgeon: Leighton Ruff, MD;  [facility];  Service: General;  Laterality: N/A;  [name] DILATION N/A 07/02/2016   Procedure: Keturah Shavers;  Surgeon: Daneil Dolin, MD;  [facility];  Service: Endoscopy;  Laterality: N/A;  . MOUTH SURGERY    . PORT-A-CATH REMOVAL  09/02/2016   Procedure: REMOVAL PORT-A-CATH;  Surgeon: Leighton Ruff, MD;  [facility];  Service: General;;  . PROSTATE BIOPSY    . REMOVAL OF GASTROSTOMY TUBE N/A 09/02/2016   Procedure: REMOVAL OF GASTROSTOMY TUBE;  Surgeon: Leighton Ruff, MD;  [facility];  Service: General;  Laterality: N/A;  . TONSILLECTOMY Left 07/09/2015   Procedure: LEFT TONSILLECTOMY;  Surgeon: Leta Baptist, MD;  Location: [facility];  Service: ENT;  Laterality: Left;  . TONSILLECTOMY    . Z-PLASTY SCAR REVISION N/A 09/02/2016   Procedure: SCAR REVISION;  Surgeon: Leighton Ruff, MD;  [facility];  Service: General;  Laterality: N/A;    Prior to  Admission medications   Medication Sig Start Date End Date Taking? Authorizing Provider  aspirin EC 81 MG tablet Take 1 tablet (81 mg total) by mouth daily. 06/17/17  Yes Caren Macadam, MD  atorvastatin (LIPITOR) 40 MG tablet Take 1 tablet (40 mg total) by mouth daily. 11/29/17  Yes Hagler, [name], MD  cetirizine (ZYRTEC) 10 MG tablet Take 10 mg by mouth daily as needed for allergies.    Yes [provider]  CHANTIX STARTING MONTH PAK 0.5 MG  X 11 & 1 MG X 42 tablet Take 1 mg by mouth 2 (two) times daily. 11/24/18  Yes [provider]  chlorhexidine (PERIDEX) 0.12 % solution 15 mLs as needed. 11/29/19  Yes [provider]  dexlansoprazole (DEXILANT) 60 MG capsule Take 1 capsule (60 mg total) by mouth daily. 07/11/19  Yes Dekayla Prestridge, Cristopher Estimable, MD  DULoxetine (CYMBALTA) 60 MG capsule Take 120 mg by mouth daily. 11/03/18  Yes [provider]  fluticasone (FLONASE) 50 MCG/ACT nasal spray Place 2 sprays into both nostrils daily as needed for allergies or rhinitis.   Yes [provider]  lisinopril (PRINIVIL,ZESTRIL) 10 MG tablet Take 1 tablet (10 mg total) by mouth daily. 05/20/17  Yes Hagler, [name], MD  Melatonin 10 MG TABS Take 1 tablet by mouth as needed.    Yes [provider]  MULTIPLE VITAMINS-MINERALS ER PO Take 1 tablet by mouth daily.    Yes [provider]  sodium fluoride (FLUORISHIELD) 1.1 % GEL dental gel Instill one drop of gel per tooth space of fluoride tray. Place over teeth for 5 minutes. Remove. Spit out excess. Repeat nightly. 03/16/17  Yes Hagler, [name], MD  tamsulosin (FLOMAX) 0.4 MG CAPS capsule Take 0.4 mg by mouth daily.   Yes [provider]  triamcinolone cream (KENALOG) 0.1 % Apply 1 application topically 2 (two) times daily as needed (itching).  03/25/17  Yes [provider]    Allergies as of 06/07/2020 - Review Complete 06/07/2020  Allergen Reaction Noted  . Bee venom Swelling 07/31/2015  . Other Other (See Comments) 11/28/2015    Family History  Problem Relation Age of Onset  . Macular degeneration Mother   . Dementia Mother   . Alcoholism Father   . Prostate cancer Father   . Diabetes Maternal Aunt   . Diabetes Maternal Uncle   . Prostate cancer Maternal Uncle   . Mental illness Sister   . Pancreatic cancer Neg Hx   . Breast cancer Neg Hx   . Colon cancer Neg Hx     Social History   Socioeconomic History  . Marital status: Single     Spouse name: Not on file  . Number of children: 0  . Years of education: Not on file  . Highest education level: Not on file  Occupational History  . Not on file  Tobacco Use  . Smoking status: Current Some Day Smoker    Packs/day: 0.50    Years: 30.00    Pack years: 15.00    Types: Cigarettes    Last attempt to quit: 11/22/2016    Years since quitting: 3.5  . Smokeless tobacco: Never Used  Vaping Use  . Vaping Use: Never used  Substance and Sexual Activity  . Alcohol use: No    Alcohol/week: 0.0 standard drinks    Comment: former abuse- quit 09/2011  . Drug use: No  . Sexual activity: Not on file  Other Topics Concern  . Not on file  Social  History Narrative   Divorced.   No children.   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    Has been smoking off/on since age 23, quit smoking 11/2016.    Exercises minimally   Eats all food groups.   Wears seatbelt.   Mother lives with him, has dementia.   Attends AA meetings.    Social Determinants of Health   Financial Resource Strain: Not on file  Food Insecurity: Not on file  Transportation Needs: Not on file  Physical Activity: Not on file  Stress: Not on file  Social Connections: Not on file  Intimate Partner Violence: Not on file    Review of Systems: See HPI, otherwise negative ROS  Physical Exam: BP 139/83   Pulse (!) 113   Temp (!) 97.1 F (36.2 C) (Temporal)   Ht 5\' 10"  (1.778 m)   Wt 180 lb 9.6 oz (81.9 kg)   BMI 25.91 kg/m  General:   Alert,   pleasant and cooperative in NAD Neck:  Supple; no masses or thyromegaly. No significant cervical adenopathy. Lungs:  Clear throughout to auscultation.   No wheezes, crackles, or rhonchi. No acute distress. Heart:  Regular rate and rhythm; no murmurs, clicks, rubs,  or gallops. Abdomen: Non-distended, normal bowel sounds.  Soft and nontender without appreciable mass or  hepatosplenomegaly.  Pulses:  Normal pulses noted. Extremities:  Without clubbing or edema.  Impression/Plan: 70 year old woman EtOH related cirrhosis remains very well compensated. Newly diagnosed prostate cancer and history of head and neck carcinoma are his major active medical problems.  GERD well controlled on Dexilant.  Recommendations: Keep your appointments with other providers  Will see you back in 9 months  Continue Dexilant 60 mg daily  Will arrange labs at next visit  Repeat EGD in 2 years  Hepatic ultrasound every 6 months  Repeat colonoscopy for screening 2028      Notice: This dictation was prepared with Dragon dictation along with smaller phrase technology. Any transcriptional errors that result from this process are unintentional and may not be corrected upon review.

## 2020-06-07 NOTE — Patient Instructions (Signed)
Keep your appointment with other providers  Will see you back in 9 months  Will arrange labs at next visit  Repeat EGD in 2 years  Hepatic ultrasound every 6 months  Repeat colonoscopy for screening 2028

## 2020-06-20 ENCOUNTER — Inpatient Hospital Stay (HOSPITAL_COMMUNITY): Payer: Medicare Other | Attending: Hematology

## 2020-06-20 ENCOUNTER — Ambulatory Visit (HOSPITAL_COMMUNITY)
Admission: RE | Admit: 2020-06-20 | Discharge: 2020-06-20 | Disposition: A | Discharge status: Home / Self Care | Payer: Medicare Other | Source: Ambulatory Visit | Attending: Hematology | Admitting: Hematology

## 2020-06-20 ENCOUNTER — Other Ambulatory Visit: Payer: Self-pay

## 2020-06-20 DIAGNOSIS — Z923 Personal history of irradiation: Secondary | ICD-10-CM | POA: Diagnosis not present

## 2020-06-20 DIAGNOSIS — C099 Malignant neoplasm of tonsil, unspecified: Secondary | ICD-10-CM

## 2020-06-20 DIAGNOSIS — R972 Elevated prostate specific antigen [PSA]: Secondary | ICD-10-CM | POA: Insufficient documentation

## 2020-06-20 DIAGNOSIS — I6523 Occlusion and stenosis of bilateral carotid arteries: Secondary | ICD-10-CM | POA: Diagnosis not present

## 2020-06-20 DIAGNOSIS — Z85818 Personal history of malignant neoplasm of other sites of lip, oral cavity, and pharynx: Secondary | ICD-10-CM | POA: Insufficient documentation

## 2020-06-20 DIAGNOSIS — Z9221 Personal history of antineoplastic chemotherapy: Secondary | ICD-10-CM | POA: Diagnosis not present

## 2020-06-20 LAB — CBC WITH DIFFERENTIAL/PLATELET
Abs Immature Granulocytes: 0.06 10*3/uL (ref 0.00–0.07)
Basophils Absolute: 0.2 10*3/uL — ABNORMAL HIGH (ref 0.0–0.1)
Basophils Relative: 2 %
Eosinophils Absolute: 0.2 10*3/uL (ref 0.0–0.5)
Eosinophils Relative: 2 %
HCT: 46 % (ref 39.0–52.0)
Hemoglobin: 14.1 g/dL (ref 13.0–17.0)
Immature Granulocytes: 1 %
Lymphocytes Relative: 13 %
Lymphs Abs: 1 10*3/uL (ref 0.7–4.0)
MCH: 27.1 pg (ref 26.0–34.0)
MCHC: 30.7 g/dL (ref 30.0–36.0)
MCV: 88.3 fL (ref 80.0–100.0)
Monocytes Absolute: 0.7 10*3/uL (ref 0.1–1.0)
Monocytes Relative: 9 %
Neutro Abs: 5.6 10*3/uL (ref 1.7–7.7)
Neutrophils Relative %: 73 %
Platelets: 297 10*3/uL (ref 150–400)
RBC: 5.21 MIL/uL (ref 4.22–5.81)
RDW: 15.9 % — ABNORMAL HIGH (ref 11.5–15.5)
WBC: 7.6 10*3/uL (ref 4.0–10.5)
nRBC: 0 % (ref 0.0–0.2)

## 2020-06-20 LAB — COMPREHENSIVE METABOLIC PANEL
ALT: 16 U/L (ref 0–44)
AST: 18 U/L (ref 15–41)
Albumin: 4.2 g/dL (ref 3.5–5.0)
Alkaline Phosphatase: 47 U/L (ref 38–126)
Anion gap: 8 (ref 5–15)
BUN: 17 mg/dL (ref 8–23)
CO2: 27 mmol/L (ref 22–32)
Calcium: 9.4 mg/dL (ref 8.9–10.3)
Chloride: 101 mmol/L (ref 98–111)
Creatinine, Ser: 0.87 mg/dL (ref 0.61–1.24)
GFR, Estimated: >60 mL/min (ref >60)
Glucose, Bld: 123 mg/dL — ABNORMAL HIGH (ref 70–99)
Potassium: 4.4 mmol/L (ref 3.5–5.1)
Sodium: 136 mmol/L (ref 135–145)
Total Bilirubin: 0.7 mg/dL (ref 0.3–1.2)
Total Protein: 7.1 g/dL (ref 6.5–8.1)

## 2020-06-20 LAB — TSH: TSH: 3.335 u[IU]/mL (ref 0.350–4.500)

## 2020-06-20 LAB — VITAMIN D 25 HYDROXY (VIT D DEFICIENCY, FRACTURES): Vit D, 25-Hydroxy: 34.41 ng/mL (ref 30–100)

## 2020-06-20 MED ORDER — IOHEXOL 300 MG/ML  SOLN
75.0000 mL | Freq: Once | INTRAMUSCULAR | Status: AC | PRN
  Administered 2020-06-20: 75 mL via INTRAVENOUS

## 2020-06-20 NOTE — Progress Notes (Signed)
Spoke with [employer] pt had cbc with dif and cmet done at [facility] today and pt, ptt will be done day of surgery, connie to ley dr gay know

## 2020-06-24 ENCOUNTER — Telehealth: Payer: Self-pay | Admitting: *Deleted

## 2020-06-24 NOTE — Telephone Encounter (Signed)
Called patient to remind of Covid testing for 06-26-20, spoke with patient and he is aware of this appt.

## 2020-06-24 NOTE — Telephone Encounter (Signed)
CALLED PATIENT TO GIVE INFO., LVM FOR A RETURN CALL 

## 2020-06-26 ENCOUNTER — Encounter (HOSPITAL_BASED_OUTPATIENT_CLINIC_OR_DEPARTMENT_OTHER): Payer: Self-pay | Admitting: Urology

## 2020-06-26 ENCOUNTER — Other Ambulatory Visit: Payer: Self-pay

## 2020-06-26 ENCOUNTER — Other Ambulatory Visit (HOSPITAL_COMMUNITY)
Admission: RE | Admit: 2020-06-26 | Discharge: 2020-06-26 | Disposition: A | Discharge status: Home / Self Care | Payer: Medicare Other | Source: Ambulatory Visit | Attending: Urology | Admitting: Urology

## 2020-06-26 DIAGNOSIS — Z20822 Contact with and (suspected) exposure to covid-19: Secondary | ICD-10-CM | POA: Diagnosis not present

## 2020-06-26 DIAGNOSIS — Z85828 Personal history of other malignant neoplasm of skin: Secondary | ICD-10-CM | POA: Diagnosis not present

## 2020-06-26 DIAGNOSIS — Z01812 Encounter for preprocedural laboratory examination: Secondary | ICD-10-CM | POA: Diagnosis not present

## 2020-06-26 DIAGNOSIS — D239 Other benign neoplasm of skin, unspecified: Secondary | ICD-10-CM | POA: Diagnosis not present

## 2020-06-26 DIAGNOSIS — L309 Dermatitis, unspecified: Secondary | ICD-10-CM | POA: Diagnosis not present

## 2020-06-26 DIAGNOSIS — Z85820 Personal history of malignant melanoma of skin: Secondary | ICD-10-CM | POA: Diagnosis not present

## 2020-06-26 LAB — SARS CORONAVIRUS 2 (TAT 6-24 HRS): SARS Coronavirus 2: NEGATIVE

## 2020-06-26 NOTE — Progress Notes (Signed)
Spoke w/ via phone for pre-op interview---pt Lab needs dos----   none            Lab results------cbc with dif and cmet 06-20-2020 epic lab appt 06-26-2020 for pt, ptt and anesthesia airway assessment  COVID test -----07-16-2020 1015- Arrive at -------1100 am 06-28-2020 NPO after MN NO Solid Food.  Clear liquids from MN until---1000 am then npo Medications to take morning of surgery -----cymbalta, flonase, tamsulosin, dexilant Diabetic medication -----n/a Patient Special Instruction none Pre-Op special Istructions -----fleets enam am of surgery Patient verbalized understanding of instructions that were given at this phone interview. Patient denies shortness of breath, chest pain, fever, cough at this phone interview.  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, portal hypertension, liver cirrhosis, htn  Chart to jessica zanetto pa for review  PCP:dr rachel hagler eagle @ [facility] Cardiologist : dr Ree Edman 05-07-2020 epic Chest x-ray :05-23-2020 epic EKG :05-23-2020 epic Ct neck 06-20-2020 epic Echo :none Vas carotid duplex 05-15-2020 epic Stress test:05-16-2020 epic Cardiac Cath : none Activity level: does own housework and chores Sleep Study/ CPAP :none Fasting Blood Sugar :      / Checks Blood Sugar -- times a day:  n/a Blood Thinner/ Instructions /Last Dose:n/a ASA / Instructions/ Last Dose : 81 ma aspirin stopped 06-22-2020 per dr Roxan Hockey instructions

## 2020-06-27 ENCOUNTER — Telehealth: Payer: Self-pay | Admitting: *Deleted

## 2020-06-27 ENCOUNTER — Ambulatory Visit: Payer: Medicare Other | Admitting: Cardiology

## 2020-06-27 ENCOUNTER — Inpatient Hospital Stay (HOSPITAL_COMMUNITY): Payer: Medicare Other | Attending: Hematology | Admitting: Hematology

## 2020-06-27 ENCOUNTER — Encounter (HOSPITAL_COMMUNITY)
Admission: RE | Admit: 2020-06-27 | Discharge: 2020-06-27 | Disposition: A | Discharge status: Home / Self Care | Payer: Medicare Other | Source: Ambulatory Visit | Attending: Urology | Admitting: Urology

## 2020-06-27 VITALS — BP 130/80 | HR 67 | Temp 99.1°F | Resp 17 | Wt 177.6 lb

## 2020-06-27 DIAGNOSIS — Z87891 Personal history of nicotine dependence: Secondary | ICD-10-CM | POA: Diagnosis not present

## 2020-06-27 DIAGNOSIS — C099 Malignant neoplasm of tonsil, unspecified: Secondary | ICD-10-CM

## 2020-06-27 DIAGNOSIS — Z923 Personal history of irradiation: Secondary | ICD-10-CM | POA: Insufficient documentation

## 2020-06-27 DIAGNOSIS — Z833 Family history of diabetes mellitus: Secondary | ICD-10-CM | POA: Insufficient documentation

## 2020-06-27 DIAGNOSIS — Z8042 Family history of malignant neoplasm of prostate: Secondary | ICD-10-CM | POA: Insufficient documentation

## 2020-06-27 DIAGNOSIS — I10 Essential (primary) hypertension: Secondary | ICD-10-CM | POA: Insufficient documentation

## 2020-06-27 DIAGNOSIS — Z122 Encounter for screening for malignant neoplasm of respiratory organs: Secondary | ICD-10-CM

## 2020-06-27 DIAGNOSIS — Z9221 Personal history of antineoplastic chemotherapy: Secondary | ICD-10-CM | POA: Insufficient documentation

## 2020-06-27 DIAGNOSIS — Z7982 Long term (current) use of aspirin: Secondary | ICD-10-CM | POA: Insufficient documentation

## 2020-06-27 DIAGNOSIS — C61 Malignant neoplasm of prostate: Secondary | ICD-10-CM | POA: Diagnosis not present

## 2020-06-27 DIAGNOSIS — Z79899 Other long term (current) drug therapy: Secondary | ICD-10-CM | POA: Insufficient documentation

## 2020-06-27 DIAGNOSIS — Z85818 Personal history of malignant neoplasm of other sites of lip, oral cavity, and pharynx: Secondary | ICD-10-CM | POA: Diagnosis not present

## 2020-06-27 DIAGNOSIS — J359 Chronic disease of tonsils and adenoids, unspecified: Secondary | ICD-10-CM

## 2020-06-27 DIAGNOSIS — Z01812 Encounter for preprocedural laboratory examination: Secondary | ICD-10-CM | POA: Insufficient documentation

## 2020-06-27 LAB — PROTIME-INR
INR: 1.1 (ref 0.8–1.2)
Prothrombin Time: 13.4 seconds (ref 11.4–15.2)

## 2020-06-27 LAB — APTT: aPTT: 33 seconds (ref 24–36)

## 2020-06-27 NOTE — Telephone Encounter (Signed)
CALLED PATIENT TO REMIND OF PROCEDURE FOR 06-28-20, SPOKE WITH PATIENT AND HE IS AWARE OF THIS PROCEDURE

## 2020-06-27 NOTE — Progress Notes (Signed)
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 Left tonsillectomy on 07/09/2015 2. Chemoradiation with cisplatin x 1 cycle and carboplatin, 5-FU x 2 cycles from 09/11/2015 to 09/30/2015 and 70 Gy.  NGS Results: Not done  CURRENT THERAPY: Observation  BRIEF ONCOLOGIC HISTORY:  Oncology History  Tonsil cancer ([place])  08/02/2015 Initial Diagnosis   Cancer of tonsillar fossa ([place])   08/20/2015 Procedure   Port-a-cath placed by IR   08/20/2015 Procedure   G-tube placed by IR   08/21/2015 - 10/09/2015 Radiation Therapy   70 Gy, Dr. Isidore [facility]illo.   08/21/2015 - 09/10/2015 Chemotherapy   Cisplatin every 21 days with XRT.   08/28/2015 Adverse Reaction   [facility], Cisplatin-induced.   09/11/2015 - 09/30/2015 Chemotherapy   Carboplatin/5FU   09/20/2015 - 09/21/2015 [facility] Admission   Intractable nausea/vomiting. Severe Mucositis   10/17/2015 - 10/20/2015 [facility] Admission   Esophageal/oral candidasis. Shingles. Fever. Chemotherapy induced pancytopenia   01/24/2016 PET scan   Resolution of the left tonsillar mass and left neck adenopathy. No findings for residual or locally recurrent tumor, adenopathy or distant metastatic disease.   03/28/2016 - 04/08/2016 [facility] Admission   Admitted with severe abdominal pain. Found to diffuse peritonitis from perforated sigmoid colon requiring colostomy. Discharged to [facility] in [place].         CANCER STAGING: Cancer Staging Malignant neoplasm of prostate [facility]) Staging form: Prostate, AJCC 8th Edition - Clinical stage from 02/08/2020: Stage IIC (cT1c, cN0, cM0, PSA: 5.9, Grade Group: 3) - Unsigned  Tonsil cancer ([place]) Staging form: Pharynx - Oropharynx, AJCC 7th Edition - Clinical: Stage IVA (T2, N2b, M0) -  Signed by Frank Gibson, MD on 08/02/2015   INTERVAL HISTORY:  [facility], a 70 y.o. male, returns for routine follow-up of his tonsillar cancer HPV positive. [name] was last seen on 12/19/2019.   Today he reports feeling well. He will have radioactive seeds placed on 02/04 for his prostate cancer. He denies having any difficulty swallowing. He reports having some pain in his left shoulder.  His father had prostate cancer and subsequently had a prostatectomy.    REVIEW OF SYSTEMS:  Review of Systems  Constitutional: Positive for fatigue (90%). Negative for appetite change.  HENT:   Negative for trouble swallowing.   Musculoskeletal: Positive for arthralgias (4/10 L shoulder pain).  All other systems reviewed and are negative.   PAST MEDICAL/SURGICAL HISTORY:  Past Medical History:  Diagnosis Date  . 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 Cirrhosis ([place])    Followed at [facility]; completed Hep A and B vaccine 05/2019 sees dr Frank [facility]  . Complication of anesthesia    neck stiffness  . Essential hypertension   . GERD (gastroesophageal reflux disease)   . History of alcohol abuse   . History of bowel resection   . History of tonsillectomy    Dr. Benjamine [facility]illo, 06/2015  . Iron deficiency anemia 05/07/2016  . Melanoma ([place]) 1994   right calf  . Mixed hyperlipidemia   . Portal hypertension ([place])   . Prostate cancer ([place])   . Tonsillar cancer ([place]) 06/2015   S[facility] chemo and radiation done  . Wears contact lenses    in left eye   Past  Surgical History:  Procedure Laterality Date  . BIOPSY  07/02/2016   Procedure: BIOPSY;  Surgeon: Frank Dolin, MD;  [facility];  Service: Endoscopy;;  esophageal  . COLON SURGERY  2017   ruptured colon  . COLONOSCOPY WITH PROPOFOL N/A 07/02/2016   Procedure: COLONOSCOPY WITH PROPOFOL;  Surgeon: Frank Dolin, MD;  [facility];  Service: Endoscopy;  Laterality: N/A;  9:45am  . COLOSTOMY TAKEDOWN N/A  09/02/2016   Procedure: LAPAROSCOPIC COLOSTOMY REVERSAL;  Surgeon: Frank Ruff, MD;  Location: [facility];  Service: General;  Laterality: N/A;  . ESOPHAGOGASTRODUODENOSCOPY (EGD) WITH PROPOFOL N/A 07/02/2016   Procedure: ESOPHAGOGASTRODUODENOSCOPY (EGD) WITH PROPOFOL;  Surgeon: Frank Dolin, MD;  [facility];  Service: Endoscopy;  Laterality: N/A;  . ESOPHAGOGASTRODUODENOSCOPY (EGD) WITH PROPOFOL N/A 12/28/2019   Procedure: ESOPHAGOGASTRODUODENOSCOPY (EGD) WITH PROPOFOL;  Surgeon: Frank Dolin, MD;  [facility];  Service: Endoscopy;  Laterality: N/A;  11:00am  . HERNIA REPAIR Right 2019   dr [facility] at [facility] with mesh  . INCISIONAL HERNIA REPAIR  09/02/2016   Procedure: HERNIA REPAIR INCISIONAL;  Surgeon: Frank Ruff, MD;  [facility];  Service: General;;  . LAPAROTOMY N/A 03/28/2016   Procedure: EXPLORATORY LAPAROTOMY, SIGMOID COLECTOMY, COLOSTOMY;  Surgeon: Frank Ruff, MD;  [facility];  Service: General;  Laterality: N/A;  [facility] DILATION N/A 07/02/2016   Procedure: [facility];  Surgeon: Frank Dolin, MD;  [facility];  Service: Endoscopy;  Laterality: N/A;  . MOUTH SURGERY  2019   left lower jaw  . PORT-A-CATH REMOVAL  09/02/2016   Procedure: REMOVAL PORT-A-CATH;  Surgeon: Frank Ruff, MD;  [facility];  Service: General;;  . PROSTATE BIOPSY  2021  . REMOVAL OF GASTROSTOMY TUBE N/A 09/02/2016   Procedure: REMOVAL OF GASTROSTOMY TUBE;  Surgeon: Frank Ruff, MD;  [facility];  Service: General;  Laterality: N/A;  . TONSILLECTOMY Left 07/09/2015   Procedure: LEFT TONSILLECTOMY;  Surgeon: Frank Baptist, MD;  Location: [place];  Service: ENT;  Laterality: Left;  . TONSILLECTOMY  2017   left tonsil  . Z-PLASTY SCAR REVISION N/A 09/02/2016   Procedure: SCAR REVISION;  Surgeon: Frank Ruff, MD;  [facility];  Service: General;  Laterality: N/A;    SOCIAL HISTORY:  Social History   Socioeconomic History   . Marital status: Single    Spouse name: Not on file  . Number of children: 0  . Years of education: Not on file  . Highest education level: Not on file  Occupational History  . Not on file  Tobacco Use  . Smoking status: Current Some Day Smoker    Packs/day: 0.50    Years: 30.00    Pack years: 15.00    Types: Cigarettes    Last attempt to quit: 11/22/2016    Years since quitting: 3.5  . Smokeless tobacco: Never Used  . Tobacco comment: smoke social occ  Vaping Use  . Vaping Use: Never used  Substance and Sexual Activity  . Alcohol use: No    Alcohol/week: 0.0 standard drinks    Comment: former abuse- quit 09/2011  . Drug use: No  . Sexual activity: Not on file  Other Topics Concern  . Not on file  Social History Narrative   Divorced.   No children.   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   Worked in [employer].    Fully  retired at this time.    Has been smoking off/on since age 52, quit smoking 11/2016.    Exercises minimally   Eats all food groups.   Wears seatbelt.   Mother lives with him, has dementia.   Attends AA meetings.    Social Determinants of Health   Financial Resource Strain: Not on file  Food Insecurity: Not on file  Transportation Needs: Not on file  Physical Activity: Not on file  Stress: Not on file  Social Connections: Not on file  Intimate Partner Violence: Not At Risk  . Fear of Current or Ex-Partner: No  . Emotionally Abused: No  . Physically Abused: No  . Sexually Abused: No    FAMILY HISTORY:  Family History  Problem Relation Age of Onset  . Macular degeneration Mother   . Dementia Mother   . Alcoholism Father   . Prostate cancer Father   . Diabetes Maternal Aunt   . Diabetes Maternal Uncle   . Prostate cancer Maternal Uncle   . Mental illness Sister   . Pancreatic cancer Neg Hx   . Breast cancer Neg Hx   . Colon cancer Neg Hx     CURRENT MEDICATIONS:  Current Outpatient Medications   Medication Sig Dispense Refill  . aspirin EC 81 MG tablet Take 1 tablet (81 mg total) by mouth daily.    Marland Kitchen atorvastatin (LIPITOR) 40 MG tablet Take 1 tablet (40 mg total) by mouth daily. (Patient taking differently: Take 40 mg by mouth at bedtime.) 90 tablet 0  . cetirizine (ZYRTEC) 10 MG tablet Take 10 mg by mouth daily as needed for allergies.     . CHANTIX STARTING MONTH PAK 0.5 MG X 11 & 1 MG X 42 tablet Take 1 mg by mouth 2 (two) times daily.    . chlorhexidine (PERIDEX) 0.12 % solution 15 mLs as needed.    Marland Kitchen dexlansoprazole (DEXILANT) 60 MG capsule Take 1 capsule (60 mg total) by mouth daily. 30 capsule 2  . DULoxetine (CYMBALTA) 60 MG capsule Take 120 mg by mouth daily.    . fluticasone (FLONASE) 50 MCG/ACT nasal spray Place 2 sprays into both nostrils daily.    Marland Kitchen lisinopril (PRINIVIL,ZESTRIL) 10 MG tablet Take 1 tablet (10 mg total) by mouth daily. 90 tablet 3  . Melatonin 10 MG TABS Take 1 tablet by mouth as needed.     . MULTIPLE VITAMINS-MINERALS ER PO Take 1 tablet by mouth daily.     . sodium fluoride (FLUORISHIELD) 1.1 % GEL dental gel Instill one drop of gel per tooth space of fluoride tray. Place over teeth for 5 minutes. Remove. Spit out excess. Repeat nightly. 125 mL 11  . tamsulosin (FLOMAX) 0.4 MG CAPS capsule Take 0.4 mg by mouth daily.    Marland Kitchen triamcinolone cream (KENALOG) 0.1 % Apply 1 application topically daily.  2   No current facility-administered medications for this visit.   Facility-Administered Medications Ordered in Other Visits  Medication Dose Route Frequency Provider Last Rate Last Admin  . 0.9 %  sodium chloride infusion   Intravenous Continuous B[facility] Cancer, PA-C   New Bag at 03/28/16 1754  . 0.9 %  sodium chloride infusion   Intravenous Continuous Kefalas, Thomas S, PA-C      . 0.9 %  sodium chloride infusion   Intravenous Continuous Penland, Larene Beach K, MD      . 0.9 %  sodium chloride infusion   Intravenous Continuous Penland, Kelby Fam, MD      .  0.9 %  sodium chloride infusion   Intravenous Continuous Penland, Kelby Fam, MD        ALLERGIES:  Allergies  Allergen Reactions  . Bee Venom Swelling and Anaphylaxis  . Other Other (See Comments)    Opthalmic mycin irritates eyes.    . Tobramycin Ophthalmic [Tobramycin]     Other reaction(s): DRYNESS, IRRITATION    PHYSICAL EXAM:  Performance status (ECOG): 1 - Symptomatic but completely ambulatory  Vitals:   06/27/20 0827  BP: 130/80  Pulse: 67  Resp: 17  Temp: 99.1 F (37.3 C)  SpO2: 99%   Wt Readings from Last 3 Encounters:  06/27/20 177 lb 9 oz (80.5 kg)  06/07/20 180 lb 9.6 oz (81.9 kg)  06/05/20 180 lb (81.6 kg)   Physical Exam Vitals reviewed.  Constitutional:      Appearance: Normal appearance.  HENT:     Mouth/Throat:     Lips: No lesions.     Mouth: No oral lesions.     Dentition: Abnormal dentition (missing teeth on bottom L jaw). No gum lesions.     Tongue: No lesions.     Palate: No mass.     Pharynx: No posterior oropharyngeal erythema.     Tonsils: No tonsillar exudate.  Cardiovascular:     Rate and Rhythm: Normal rate and regular rhythm.     Pulses: Normal pulses.     Heart sounds: Normal heart sounds.  Pulmonary:     Effort: Pulmonary effort is normal.     Breath sounds: Normal breath sounds.  Chest:  Breasts:     Right: No supraclavicular adenopathy.     Left: No supraclavicular adenopathy.    Lymphadenopathy:     Cervical: No cervical adenopathy.     Upper Body:     Right upper body: No supraclavicular adenopathy.     Left upper body: No supraclavicular adenopathy.  Neurological:     General: No focal deficit present.     Mental Status: He is alert and oriented to person, place, and time.  Psychiatric:        Mood and Affect: Mood normal.        Behavior: Behavior normal.      LABORATORY DATA:  I have reviewed the labs as listed.  CBC Latest Ref Rng & Units 06/20/2020 12/12/2019 06/06/2019  WBC 4.0 - 10.5 K/uL 7.6 7.0 7.6   Hemoglobin 13.0 - 17.0 g/dL 14.1 13.3 13.3  Hematocrit 39.0 - 52.0 % 46.0 41.4 42.8  Platelets 150 - 400 K/uL 297 293 317   CMP Latest Ref Rng & Units 06/20/2020 12/12/2019 06/06/2019  Glucose 70 - 99 mg/dL 123(H) 127(H) 119(H)  BUN 8 - 23 mg/dL 17 12 19   Creatinine 0.61 - 1.24 mg/dL 0.87 0.81 0.79  Sodium 135 - 145 mmol/L 136 137 134(L)  Potassium 3.5 - 5.1 mmol/L 4.4 4.4 4.4  Chloride 98 - 111 mmol/L 101 101 101  CO2 22 - 32 mmol/L 27 24 26   Calcium 8.9 - 10.3 mg/dL 9.4 9.1 9.2  Total Protein 6.5 - 8.1 g/dL 7.1 6.8 6.8  Total Bilirubin 0.3 - 1.2 mg/dL 0.7 0.6 0.6  Alkaline Phos 38 - 126 U/L 47 48 45  AST 15 - 41 U/L 18 17 16   ALT 0 - 44 U/L 16 20 20    Lab Results  Component Value Date   VD25OH 34.41 06/20/2020   VD25OH 20.71 (L) 12/12/2019   VD25OH 49 03/19/2017    DIAGNOSTIC IMAGING:  I have  independently reviewed the scans and discussed with the patient. CT SOFT TISSUE NECK W CONTRAST  Result Date: 06/20/2020 CLINICAL DATA:  Left tonsillar cancer post chemoradiation EXAM: CT NECK WITH CONTRAST TECHNIQUE: Multidetector CT imaging of the neck was performed using the standard protocol following the bolus administration of intravenous contrast. CONTRAST:  34mL OMNIPAQUE IOHEXOL 300 MG/ML  SOLN COMPARISON:  PET CT 2019 FINDINGS: Pharynx and larynx: Soft tissue thickening likely reflecting post radiation change present primarily at the level of the oropharynx and supraglottic larynx. Vocal cords are opposed. No mass. Salivary glands: Post radiation changes.  Otherwise unremarkable. Thyroid: Normal. Lymph nodes: No enlarged or abnormal density lymph nodes. Vascular: Major neck vessels are patent. There is atherosclerotic plaque along the common carotids and ICA origins. Limited intracranial: No abnormal enhancement. Visualized orbits: Unremarkable Mastoids and visualized paranasal sinuses: Minor mucosal thickening. Visualized mastoid air cells are clear. Skeleton: Degenerative changes of  the cervical spine, greatest at C5-C6 and C6-C7. Upper chest: No apical lung mass. Other: None. IMPRESSION: Post radiation changes. No evidence of recurrent disease. No new adenopathy. Electronically Signed   By: Macy Mis M.D.   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 -Today's physical exam did not reveal any palpable lymphadenopathy in the neck region. No masses in the oral cavity. -Patient denies any dysphagia. -Labs done on1/04/2020 showed potassium 4.4, creatinine 0.79, LFTs WNL, WBC 7.6, hemoglobin 13.3, platelets 317. -We will see him back in 6 months for follow-up with labs.  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 Benjamine Castillo, referred to Dr. Nicky Pugh did marginal mandibulectomy with primary closure on 09/20/2017. -This showed 2.1 x 0.9 x 0.4 cm squamous cell carcinoma with mandibular bone invasion. -Today's examination in the oral cavity did not reveal any suspicious masses. -His PET CT scan on 08/24/2017 was negative for any metastatic adenopathy. -He continues to see Dr. Vicenta Aly 4 months. He sees Dr. Dorian Furnace 6 months. -Patient reports he has a visit with his dentist today on 06/13/2019 for a regular oral check and teeth cleaning. -We will see him back in 6 months of repeat blood work and follow-up.   PLAN:  1.  Stage IV left tonsillar cancer: -Physical examination did not reveal any palpable masses or tonsillar masses.  No adenopathy. -Reviewed CT of the neck which did not show any evidence of recurrence. -TSH is 3.3.  Rest of the labs were grossly within normal limits. -Continue follow-ups with Dr. Benjamine Castillo.  2.  Left anterior  floor of the mouth squamous cell carcinoma: -No visible masses in the floor of the mouth or oral cavity. -Reviewed CT scan which did not show any evidence of recurrence or adenopathy. -RTC follow-up in 6 months with CT of the neck.  3.  Smoking history: -He will have another low-dose CT scan of the lungs later this year.  We will schedule it.  4.  Prostate cancer: -He has prostate cancer which is newly diagnosed.  He is undergoing seed implants by Dr. Tammi Klippel tomorrow.   Orders placed this encounter:  No orders of the defined types were placed in this encounter.    Frank Jack, MD [place] [phone]   I, Milinda Antis, am acting as  a scribe for Dr. Sanda Linger.  I, Frank Jack MD, have reviewed the above documentation for accuracy and completeness, and I agree with the above.

## 2020-06-27 NOTE — Progress Notes (Signed)
Anesthesia Chart Review  He has a h/o left tonsillar cancer and mandibular cancer s/p chemo and radiation and tonsillectomy in 2017 and partial mandibulectomy 09/20/17.  Last seen by ENT 02/27/2020. Per this note left mandible surgically absent, some neck stiffness left worse than right, poor dentition.  Mild dysphagia. S/p EGD 12/28/2019 at [facility]. ASA III, no complications noted.  Evaluated by Dr. Daiva Huge in PAT 06/27/20.   Konrad Felix, PA-C [facility] [phone]

## 2020-06-27 NOTE — Patient Instructions (Signed)
[place] at [facility] Discharge Instructions  You were seen today by Dr. Delton Coombes. He went over your recent results and scans. You will be scheduled for a CT scan of your neck and your chest before your next visit. Dr. Delton Coombes will see you back in 6 months for labs and follow up.   Thank you for choosing [place] at [facility] to provide your oncology and hematology care.  To afford each patient quality time with our provider, please arrive at least 15 minutes before your scheduled appointment time.   If you have a lab appointment with the [facility] please come in thru the Main Entrance and check in at the main information desk  You need to re-schedule your appointment should you arrive 10 or more minutes late.  We strive to give you quality time with our providers, and arriving late affects you and other patients whose appointments are after yours.  Also, if you no show three or more times for appointments you may be dismissed from the clinic at the providers discretion.     Again, thank you for choosing [facility]  Our hope is that these requests will decrease the amount of time that you wait before being seen by our physicians.       _____________________________________________________________  Should you have questions after your visit to [facility], please contact our office at [phone] between the hours of 8:00 a.m. and 4:30 p.m.  Voicemails left after 4:00 p.m. will not be returned until the following business day.  For prescription refill requests, have your pharmacy contact our office and allow 72 hours.    Cancer Center Support Programs:   > Cancer Support Group  2nd Tuesday of the month 1pm-2pm, Journey Room

## 2020-06-28 ENCOUNTER — Ambulatory Visit (HOSPITAL_BASED_OUTPATIENT_CLINIC_OR_DEPARTMENT_OTHER)
Admission: RE | Admit: 2020-06-28 | Discharge: 2020-06-28 | Disposition: A | Discharge status: Home / Self Care | Payer: Medicare Other | Source: Ambulatory Visit | Attending: Urology | Admitting: Urology

## 2020-06-28 ENCOUNTER — Ambulatory Visit (HOSPITAL_COMMUNITY): Payer: Medicare Other

## 2020-06-28 ENCOUNTER — Ambulatory Visit (HOSPITAL_BASED_OUTPATIENT_CLINIC_OR_DEPARTMENT_OTHER): Payer: Medicare Other | Admitting: Physician Assistant

## 2020-06-28 ENCOUNTER — Encounter (HOSPITAL_BASED_OUTPATIENT_CLINIC_OR_DEPARTMENT_OTHER)
Admission: RE | Disposition: A | Discharge status: Home / Self Care | Payer: Self-pay | Source: Ambulatory Visit | Attending: Urology

## 2020-06-28 ENCOUNTER — Encounter (HOSPITAL_BASED_OUTPATIENT_CLINIC_OR_DEPARTMENT_OTHER): Payer: Self-pay | Admitting: Urology

## 2020-06-28 DIAGNOSIS — I10 Essential (primary) hypertension: Secondary | ICD-10-CM | POA: Diagnosis not present

## 2020-06-28 DIAGNOSIS — Z85818 Personal history of malignant neoplasm of other sites of lip, oral cavity, and pharynx: Secondary | ICD-10-CM | POA: Insufficient documentation

## 2020-06-28 DIAGNOSIS — Z79899 Other long term (current) drug therapy: Secondary | ICD-10-CM | POA: Insufficient documentation

## 2020-06-28 DIAGNOSIS — Z85820 Personal history of malignant melanoma of skin: Secondary | ICD-10-CM | POA: Diagnosis not present

## 2020-06-28 DIAGNOSIS — I2510 Atherosclerotic heart disease of native coronary artery without angina pectoris: Secondary | ICD-10-CM | POA: Diagnosis not present

## 2020-06-28 DIAGNOSIS — C61 Malignant neoplasm of prostate: Secondary | ICD-10-CM | POA: Diagnosis not present

## 2020-06-28 DIAGNOSIS — D509 Iron deficiency anemia, unspecified: Secondary | ICD-10-CM | POA: Diagnosis not present

## 2020-06-28 HISTORY — PX: RADIOACTIVE SEED IMPLANT: SHX5150

## 2020-06-28 HISTORY — PX: SPACE OAR INSTILLATION: SHX6769

## 2020-06-28 HISTORY — DX: Presence of spectacles and contact lenses: Z97.3

## 2020-06-28 HISTORY — DX: Other complications of anesthesia, initial encounter: T88.59XA

## 2020-06-28 SURGERY — INSERTION, RADIATION SOURCE, PROSTATE
Anesthesia: General | Site: Prostate

## 2020-06-28 MED ORDER — EPHEDRINE 5 MG/ML INJ
INTRAVENOUS | Status: AC
  Filled 2020-06-28: qty 10

## 2020-06-28 MED ORDER — ROCURONIUM BROMIDE 10 MG/ML (PF) SYRINGE
PREFILLED_SYRINGE | INTRAVENOUS | Status: DC | PRN
  Administered 2020-06-28: 40 mg via INTRAVENOUS

## 2020-06-28 MED ORDER — FENTANYL CITRATE (PF) 100 MCG/2ML IJ SOLN
25.0000 ug | INTRAMUSCULAR | Status: DC | PRN
Start: 2020-06-28 — End: 2020-06-28

## 2020-06-28 MED ORDER — OXYCODONE HCL 5 MG/5ML PO SOLN: 5.0000 mg | Freq: Once | ORAL | Status: DC | PRN

## 2020-06-28 MED ORDER — DEXAMETHASONE SODIUM PHOSPHATE 4 MG/ML IJ SOLN
INTRAMUSCULAR | Status: DC | PRN
  Administered 2020-06-28: 10 mg via INTRAVENOUS

## 2020-06-28 MED ORDER — PHENYLEPHRINE 40 MCG/ML (10ML) SYRINGE FOR IV PUSH (FOR BLOOD PRESSURE SUPPORT)
PREFILLED_SYRINGE | INTRAVENOUS | Status: DC | PRN
  Administered 2020-06-28: 40 ug via INTRAVENOUS
  Administered 2020-06-28: 80 ug via INTRAVENOUS
  Administered 2020-06-28: 120 ug via INTRAVENOUS

## 2020-06-28 MED ORDER — PHENYLEPHRINE 40 MCG/ML (10ML) SYRINGE FOR IV PUSH (FOR BLOOD PRESSURE SUPPORT)
PREFILLED_SYRINGE | INTRAVENOUS | Status: AC
  Filled 2020-06-28: qty 10

## 2020-06-28 MED ORDER — LIDOCAINE HCL (CARDIAC) PF 100 MG/5ML IV SOSY
PREFILLED_SYRINGE | INTRAVENOUS | Status: DC | PRN
  Administered 2020-06-28: 60 mg via INTRAVENOUS

## 2020-06-28 MED ORDER — PROPOFOL 10 MG/ML IV BOLUS
INTRAVENOUS | Status: AC
  Filled 2020-06-28: qty 20

## 2020-06-28 MED ORDER — ONDANSETRON HCL 4 MG/2ML IJ SOLN: 4.0000 mg | Freq: Once | INTRAMUSCULAR | Status: DC | PRN

## 2020-06-28 MED ORDER — SODIUM CHLORIDE (PF) 0.9 % IJ SOLN
INTRAMUSCULAR | Status: DC | PRN
  Administered 2020-06-28: 10 mL

## 2020-06-28 MED ORDER — AMISULPRIDE (ANTIEMETIC) 5 MG/2ML IV SOLN: 10.0000 mg | Freq: Once | INTRAVENOUS | Status: DC | PRN

## 2020-06-28 MED ORDER — DEXAMETHASONE SODIUM PHOSPHATE 10 MG/ML IJ SOLN
INTRAMUSCULAR | Status: AC
  Filled 2020-06-28: qty 1

## 2020-06-28 MED ORDER — ESMOLOL HCL 100 MG/10ML IV SOLN
INTRAVENOUS | Status: AC
  Filled 2020-06-28: qty 10

## 2020-06-28 MED ORDER — CIPROFLOXACIN IN D5W 400 MG/200ML IV SOLN
400.0000 mg | INTRAVENOUS | Status: AC
  Administered 2020-06-28: 400 mg via INTRAVENOUS

## 2020-06-28 MED ORDER — WHITE PETROLATUM EX OINT
TOPICAL_OINTMENT | CUTANEOUS | Status: AC
  Filled 2020-06-28: qty 5

## 2020-06-28 MED ORDER — CIPROFLOXACIN IN D5W 400 MG/200ML IV SOLN
INTRAVENOUS | Status: AC
  Filled 2020-06-28: qty 200

## 2020-06-28 MED ORDER — FENTANYL CITRATE (PF) 100 MCG/2ML IJ SOLN
INTRAMUSCULAR | Status: DC | PRN
  Administered 2020-06-28 (×2): 50 ug via INTRAVENOUS

## 2020-06-28 MED ORDER — ACETAMINOPHEN 500 MG PO TABS
1000.0000 mg | ORAL_TABLET | Freq: Once | ORAL | Status: AC
  Administered 2020-06-28: 1000 mg via ORAL

## 2020-06-28 MED ORDER — OXYCODONE-ACETAMINOPHEN 5-325 MG PO TABS: 1.0000 | ORAL_TABLET | ORAL | 0 refills | Status: DC | PRN

## 2020-06-28 MED ORDER — OXYCODONE HCL 5 MG PO TABS: 5.0000 mg | ORAL_TABLET | Freq: Once | ORAL | Status: DC | PRN

## 2020-06-28 MED ORDER — ROCURONIUM BROMIDE 10 MG/ML (PF) SYRINGE
PREFILLED_SYRINGE | INTRAVENOUS | Status: AC
  Filled 2020-06-28: qty 10

## 2020-06-28 MED ORDER — MIDAZOLAM HCL 5 MG/5ML IJ SOLN
INTRAMUSCULAR | Status: DC | PRN
  Administered 2020-06-28: 2 mg via INTRAVENOUS

## 2020-06-28 MED ORDER — SUCCINYLCHOLINE CHLORIDE 20 MG/ML IJ SOLN
INTRAMUSCULAR | Status: DC | PRN
  Administered 2020-06-28: 100 mg via INTRAVENOUS

## 2020-06-28 MED ORDER — LACTATED RINGERS IV SOLN: INTRAVENOUS | Status: DC

## 2020-06-28 MED ORDER — ONDANSETRON HCL 4 MG/2ML IJ SOLN
INTRAMUSCULAR | Status: DC | PRN
  Administered 2020-06-28: 4 mg via INTRAVENOUS

## 2020-06-28 MED ORDER — DOCUSATE SODIUM 100 MG PO CAPS: 100.0000 mg | ORAL_CAPSULE | Freq: Every day | ORAL | 0 refills | Status: DC | PRN

## 2020-06-28 MED ORDER — ONDANSETRON HCL 4 MG/2ML IJ SOLN
INTRAMUSCULAR | Status: AC
  Filled 2020-06-28: qty 2

## 2020-06-28 MED ORDER — MIDAZOLAM HCL 2 MG/2ML IJ SOLN
INTRAMUSCULAR | Status: AC
  Filled 2020-06-28: qty 2

## 2020-06-28 MED ORDER — LIDOCAINE HCL (PF) 2 % IJ SOLN
INTRAMUSCULAR | Status: AC
  Filled 2020-06-28: qty 5

## 2020-06-28 MED ORDER — SODIUM CHLORIDE 0.9 % IV SOLN
INTRAVENOUS | Status: AC | PRN
  Administered 2020-06-28: 1000 mL

## 2020-06-28 MED ORDER — ACETAMINOPHEN 500 MG PO TABS
ORAL_TABLET | ORAL | Status: AC
  Filled 2020-06-28: qty 2

## 2020-06-28 MED ORDER — CEPHALEXIN 500 MG PO CAPS: 500.0000 mg | ORAL_CAPSULE | Freq: Four times a day (QID) | ORAL | 0 refills | Status: AC

## 2020-06-28 MED ORDER — FLEET ENEMA 7-19 GM/118ML RE ENEM: 1.0000 | ENEMA | Freq: Once | RECTAL | Status: DC

## 2020-06-28 MED ORDER — SUCCINYLCHOLINE CHLORIDE 200 MG/10ML IV SOSY
PREFILLED_SYRINGE | INTRAVENOUS | Status: AC
  Filled 2020-06-28: qty 10

## 2020-06-28 MED ORDER — EPHEDRINE SULFATE-NACL 50-0.9 MG/10ML-% IV SOSY
PREFILLED_SYRINGE | INTRAVENOUS | Status: DC | PRN
  Administered 2020-06-28: 10 mg via INTRAVENOUS

## 2020-06-28 MED ORDER — FENTANYL CITRATE (PF) 100 MCG/2ML IJ SOLN
INTRAMUSCULAR | Status: AC
  Filled 2020-06-28: qty 2

## 2020-06-28 MED ORDER — SUGAMMADEX SODIUM 200 MG/2ML IV SOLN
INTRAVENOUS | Status: DC | PRN
  Administered 2020-06-28: 200 mg via INTRAVENOUS

## 2020-06-28 MED ORDER — IOHEXOL 300 MG/ML  SOLN
INTRAMUSCULAR | Status: DC | PRN
  Administered 2020-06-28: 7 mL

## 2020-06-28 MED ORDER — PROPOFOL 10 MG/ML IV BOLUS
INTRAVENOUS | Status: DC | PRN
  Administered 2020-06-28: 150 mg via INTRAVENOUS
  Administered 2020-06-28 (×2): 50 mg via INTRAVENOUS

## 2020-06-28 SURGICAL SUPPLY — 33 items
BAG DRN RND TRDRP ANRFLXCHMBR (UROLOGICAL SUPPLIES) ×1
BAG URINE DRAIN 2000ML AR STRL (UROLOGICAL SUPPLIES) ×2 IMPLANT
BLADE CLIPPER SENSICLIP SURGIC (BLADE) ×2 IMPLANT
CATH FOLEY 2WAY SLVR  5CC 16FR (CATHETERS) ×1
CATH FOLEY 2WAY SLVR 5CC 16FR (CATHETERS) ×1 IMPLANT
CATH ROBINSON RED A/P 20FR (CATHETERS) ×2 IMPLANT
CLOTH BEACON ORANGE TIMEOUT ST (SAFETY) ×2 IMPLANT
CNTNR URN SCR LID CUP LEK RST (MISCELLANEOUS) ×2 IMPLANT
CONT SPEC 4OZ STRL OR WHT (MISCELLANEOUS) ×4
COVER BACK TABLE 60X90IN (DRAPES) ×2 IMPLANT
COVER MAYO STAND STRL (DRAPES) ×2 IMPLANT
DRAPE C-ARM 35X43 STRL (DRAPES) ×2 IMPLANT
DRSG TEGADERM 4X4.75 (GAUZE/BANDAGES/DRESSINGS) ×2 IMPLANT
DRSG TEGADERM 8X12 (GAUZE/BANDAGES/DRESSINGS) ×4 IMPLANT
GAUZE SPONGE 4X4 12PLY STRL (GAUZE/BANDAGES/DRESSINGS) ×2 IMPLANT
GLOVE SURG ENC MOIS LTX SZ6.5 (GLOVE) ×2 IMPLANT
GLOVE SURG ENC MOIS LTX SZ7 (GLOVE) ×2 IMPLANT
GLOVE SURG ENC MOIS LTX SZ8 (GLOVE) ×2 IMPLANT
GLOVE SURG ORTHO LTX SZ8.5 (GLOVE) ×6 IMPLANT
GLOVE SURG UNDER POLY LF SZ7 (GLOVE) ×8 IMPLANT
GOWN STRL REUS W/TWL LRG LVL3 (GOWN DISPOSABLE) ×8 IMPLANT
I-Seed AgX100 ×140 IMPLANT
IMPL SPACEOAR VUE SYSTEM (Spacer) ×1 IMPLANT
IMPLANT SPACEOAR VUE SYSTEM (Spacer) ×2 IMPLANT
IV NS 1000ML (IV SOLUTION) ×4
IV NS 1000ML BAXH (IV SOLUTION) ×2 IMPLANT
KIT TURNOVER CYSTO (KITS) ×2 IMPLANT
MARKER SKIN DUAL TIP RULER LAB (MISCELLANEOUS) ×2 IMPLANT
PACK CYSTO (CUSTOM PROCEDURE TRAY) ×2 IMPLANT
SYR 10ML LL (SYRINGE) ×2 IMPLANT
TOWEL OR 17X26 10 PK STRL BLUE (TOWEL DISPOSABLE) ×2 IMPLANT
UNDERPAD 30X36 HEAVY ABSORB (UNDERPADS AND DIAPERS) ×4 IMPLANT
WATER STERILE IRR 500ML POUR (IV SOLUTION) ×2 IMPLANT

## 2020-06-28 NOTE — H&P (Signed)
Office Visit Report     05/15/2020   --------------------------------------------------------------------------------   Frank Castillo  MRN: [id number]  DOB: [birth year], 70 year old Male  SSN:    PRIMARY CARE:    REFERRING:  Azucena Fallen, NP  PROVIDER:  Wendie Simmer, M.D.  TREATING:  Rexene Alberts, M.D.  LOCATION:  [facility] [phone]     --------------------------------------------------------------------------------   CC/HPI: Frank Castillo is a 70 year old male seen in consultation today with prostate cancer.   He is a former Dr. Gloriann Loan patient has seen Dr. Tammi Klippel and has elected to proceed with brachytherapy.   He was diagnosed on 02/11/2020 with clinical stage T1c NXMX Gleason score 4+3 = 7 (1/12), GS 3+4 = 7 (2/12) adenocarcinoma the prostate with a prebiopsy PSA of 5.91 NG/mL. TRUS volume was 48 cc. He denies bone pain or back pain. He has no significant bothersome lower urinary tract symptoms. He remains on tamsulosin with benefit.   Patient currently denies fever, chills, sweats, nausea, vomiting, abdominal or flank pain, gross hematuria or dysuria.        ALLERGIES: No Known Drug Allergies    MEDICATIONS: Aspirin 81 mg tablet,chewable  Lisinopril 10 mg tablet  Tamsulosin Hcl 0.4 mg capsule 1 capsule PO Daily  Atorvastatin Calcium 40 mg tablet  Chantix  Cymbalta  Dexilant     GU PSH: Prostate Needle Biopsy - 02/08/2020       PSH Notes: Colon Surgery     NON-GU PSH: Hernia Repair Jaw Arthroscopy/surgery Surgical Pathology, Gross And Microscopic Examination For Prostate Needle - 02/08/2020 Tonsillectomy     GU PMH: Prostate Cancer - 03/26/2020, - 02/15/2020 Weak Urinary Stream - 03/26/2020, (Stable), - 12/21/2019, - 11/14/2019 Elevated PSA - 02/08/2020, - 11/14/2019 BPH w/LUTS - 12/21/2019 Encounter for Prostate Cancer screening - 12/21/2019 Nocturia - 12/21/2019 Urinary Frequency - 11/14/2019      PMH Notes: Throat cancer  Bone Cancer    NON-GU PMH: GERD Hypercholesterolemia Hypertension Liver Disease    FAMILY HISTORY: Prostate Cancer - Uncle   SOCIAL HISTORY: Marital Status: Divorced Preferred Language: English; Race: White Current Smoking Status: Patient does not smoke anymore. Has not smoked since 10/24/2018. Smoked for 50 years.   Tobacco Use Assessment Completed: Used Tobacco in last 30 days? Drinks 4+ caffeinated drinks per day.    REVIEW OF SYSTEMS:    GU Review Male:   Patient denies frequent urination, hard to postpone urination, burning/ pain with urination, get up at night to urinate, leakage of urine, stream starts and stops, trouble starting your stream, have to strain to urinate , erection problems, and penile pain.  Gastrointestinal (Upper):   Patient denies nausea, vomiting, and indigestion/ heartburn.  Gastrointestinal (Lower):   Patient denies diarrhea and constipation.  Constitutional:   Patient denies fever, night sweats, weight loss, and fatigue.  Skin:   Patient denies skin rash/ lesion and itching.  Eyes:   Patient denies blurred vision and double vision.  Ears/ Nose/ Throat:   Patient denies sore throat and sinus problems.  Hematologic/Lymphatic:   Patient denies swollen glands and easy bruising.  Cardiovascular:   Patient denies leg swelling and chest pains.  Respiratory:   Patient denies cough and shortness of breath.  Endocrine:   Patient denies excessive thirst.  Musculoskeletal:   Patient denies joint pain and back pain.  Neurological:   Patient denies headaches and dizziness.  Psychologic:   Patient denies depression and anxiety.   VITAL SIGNS:      05/15/2020  10:51 AM  Weight 180 lb / 81.65 kg  Height 70 in / 177.8 cm  BP 121/71 mmHg  Pulse 91 /min  Temperature 97.7 F / 36.5 C  BMI 25.8 kg/m   MULTI-SYSTEM PHYSICAL EXAMINATION:    Constitutional: Well-nourished. No physical deformities. Normally developed. Good grooming.  Respiratory: No labored breathing, no use of  accessory muscles.   Cardiovascular: Normal temperature, normal extremity pulses, no swelling, no varicosities.  Gastrointestinal: No mass, no tenderness, no rigidity, non obese abdomen.     Complexity of Data:  Source Of History:  Patient, Medical Record Summary  Lab Test Review:   PSA  Records Review:   AUA Symptom Score, Pathology Reports, IIEF Score  Urine Test Review:   Urinalysis   PROCEDURES:          Urinalysis Dipstick Dipstick Cont'd  Color: Yellow Bilirubin: Neg mg/dL  Appearance: Clear Ketones: Neg mg/dL  Specific Gravity: 1.020 Blood: Neg ery/uL  pH: 6.5 Protein: Neg mg/dL  Glucose: Neg mg/dL Urobilinogen: 0.2 mg/dL    Nitrites: Neg    Leukocyte Esterase: Neg leu/uL    ASSESSMENT:      ICD-10 Details  1 GU:   Prostate Cancer - C61    PLAN:           Document Letter(s):  Created for Patient: Clinical Summary         Notes:   #1. Localized unfavorable intermediate risk prostate cancer:  -Dx 02/11/2020 with clinical stage T1c NXMX Gleason score 4+3 = 7 (1/12), GS 3+4 = 7 (2/12) adenocarcinoma the prostate with a prebiopsy PSA of 5.91 NG/mL. TRUS volume was 48 cc.  -He has met with Dr. Tammi Klippel and elects to proceed with brachytherapy with SpaceOAR.  -Surgery letter sent. Will schedule.           Next Appointment:      Next Appointment: 06/28/2020 01:00 PM    Appointment Type: Surgery     Location: [facility] [phone]    Provider: Rexene Alberts, M.D.    Reason for Visit: NE/OP RAD SEED AND SPACE OAR      Signed by Rexene Alberts, M.D. on 05/15/20 at 11:28 AM (EST)  Urology Preoperative H&P   Chief Complaint: Prostate cancer  History of Present Illness: Frank Castillo is a 70 y.o. male with prostate cancer here for brachytherapy of the prostate, injection of SpaceOAR and cystoscopy. Reports doing well. No fevers or chills.  Past Medical History:  Diagnosis Date  . 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 Cirrhosis ([place])     Followed at [facility]; completed Hep A and B vaccine 05/2019 sees dr Sydell Axon Mariel Kansky  . Complication of anesthesia    neck stiffness  . Essential hypertension   . GERD (gastroesophageal reflux disease)   . History of alcohol abuse   . History of bowel resection   . History of tonsillectomy    Dr. Benjamine Mola, 06/2015  . Iron deficiency anemia 05/07/2016  . Melanoma ([place]) 1994   right calf  . Mixed hyperlipidemia   . Portal hypertension ([place])   . Prostate cancer ([place])   . 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 Wears contact lenses    in left eye    Past Surgical History:  Procedure Laterality Date  . BIOPSY  07/02/2016   Procedure: BIOPSY;  Surgeon: Daneil Dolin, MD;  [facility];  Service: Endoscopy;;  esophageal  .  COLON SURGERY  2017   ruptured colon  . COLONOSCOPY WITH PROPOFOL N/A 07/02/2016   Procedure: COLONOSCOPY WITH PROPOFOL;  Surgeon: Daneil Dolin, MD;  [facility];  Service: Endoscopy;  Laterality: N/A;  9:45am  . COLOSTOMY TAKEDOWN N/A 09/02/2016   Procedure: LAPAROSCOPIC COLOSTOMY REVERSAL;  Surgeon: Leighton Ruff, MD;  [facility];  Service: General;  Laterality: N/A;  . ESOPHAGOGASTRODUODENOSCOPY (EGD) WITH PROPOFOL N/A 07/02/2016   Procedure: ESOPHAGOGASTRODUODENOSCOPY (EGD) WITH PROPOFOL;  Surgeon: Daneil Dolin, MD;  [facility];  Service: Endoscopy;  Laterality: N/A;  . ESOPHAGOGASTRODUODENOSCOPY (EGD) WITH PROPOFOL N/A 12/28/2019   Procedure: ESOPHAGOGASTRODUODENOSCOPY (EGD) WITH PROPOFOL;  Surgeon: Daneil Dolin, MD;  [facility];  Service: Endoscopy;  Laterality: N/A;  11:00am  . HERNIA REPAIR Right 2019   dr Shawnie Pons at [facility] with mesh  . INCISIONAL HERNIA REPAIR  09/02/2016   Procedure: HERNIA REPAIR INCISIONAL;  Surgeon: Leighton Ruff, MD;  [facility];  Service: General;;  . LAPAROTOMY N/A 03/28/2016   Procedure: EXPLORATORY LAPAROTOMY, SIGMOID COLECTOMY, COLOSTOMY;   Surgeon: Leighton Ruff, MD;  [facility];  Service: General;  Laterality: N/A;  [name] DILATION N/A 07/02/2016   Procedure: Keturah Shavers;  Surgeon: Daneil Dolin, MD;  [facility];  Service: Endoscopy;  Laterality: N/A;  . MOUTH SURGERY  2019   left lower jaw  . PORT-A-CATH REMOVAL  09/02/2016   Procedure: REMOVAL PORT-A-CATH;  Surgeon: Leighton Ruff, MD;  [facility];  Service: General;;  . PROSTATE BIOPSY  2021  . REMOVAL OF GASTROSTOMY TUBE N/A 09/02/2016   Procedure: REMOVAL OF GASTROSTOMY TUBE;  Surgeon: Leighton Ruff, MD;  [facility];  Service: General;  Laterality: N/A;  . TONSILLECTOMY Left 07/09/2015   Procedure: LEFT TONSILLECTOMY;  Surgeon: Leta Baptist, MD;  Location: [facility];  Service: ENT;  Laterality: Left;  . TONSILLECTOMY  2017   left tonsil  . Z-PLASTY SCAR REVISION N/A 09/02/2016   Procedure: SCAR REVISION;  Surgeon: Leighton Ruff, MD;  [facility];  Service: General;  Laterality: N/A;    Allergies:  Allergies  Allergen Reactions  . Bee Venom Swelling and Anaphylaxis  . Other Other (See Comments)    Opthalmic mycin irritates eyes.    . Tobramycin Ophthalmic [Tobramycin]     Other reaction(s): DRYNESS, IRRITATION    Family History  Problem Relation Age of Onset  . Macular degeneration Mother   . Dementia Mother   . Alcoholism Father   . Prostate cancer Father   . Diabetes Maternal Aunt   . Diabetes Maternal Uncle   . Prostate cancer Maternal Uncle   . Mental illness Sister   . Pancreatic cancer Neg Hx   . Breast cancer Neg Hx   . Colon cancer Neg Hx     Social History:  reports that he has been smoking cigarettes. He has a 15.00 pack-year smoking history. He has never used smokeless tobacco. He reports that he does not drink alcohol and does not use drugs.  ROS: A complete review of systems was performed.  All systems are negative except for pertinent findings as noted.  Physical Exam:  Vital signs in  last 24 hours: Temp:  [97.4 F (36.3 C)] 97.4 F (36.3 C) (02/04 1134) Pulse Rate:  [89] 89 (02/04 1134) Resp:  [15] 15 (02/04 1134) BP: (146)/(94) 146/94 (02/04 1134) SpO2:  [100 %] 100 % (02/04 1134) Weight:  [79.3 kg] 79.3 kg (02/04  1134) Constitutional:  Alert and oriented, No acute distress Cardiovascular: Regular rate and rhythm Respiratory: Normal respiratory effort, Lungs clear bilaterally GI: Abdomen is soft, nontender, nondistended, no abdominal masses GU: No CVA tenderness Lymphatic: No lymphadenopathy Neurologic: Grossly intact, no focal deficits Psychiatric: Normal mood and affect  Laboratory Data:  No results for input(s): WBC, HGB, HCT, PLT in the last 72 hours.  No results for input(s): NA, K, CL, GLUCOSE, BUN, CALCIUM, CREATININE in the last 72 hours.  Invalid input(s): CO3   Results for orders placed or performed during the hospital encounter of 06/27/20 (from the past 24 hour(s))  Protime-INR     Status: None   Collection Time: 06/27/20  1:22 PM  Result Value Ref Range   Prothrombin Time 13.4 11.4 - 15.2 seconds   INR 1.1 0.8 - 1.2  APTT     Status: None   Collection Time: 06/27/20  1:22 PM  Result Value Ref Range   aPTT 33 24 - 36 seconds   Recent Results (from the past 240 hour(s))  SARS CORONAVIRUS 2 (TAT 6-24 HRS) Nasopharyngeal Nasopharyngeal Swab     Status: None   Collection Time: 06/26/20 11:21 AM   Specimen: Nasopharyngeal Swab  Result Value Ref Range Status   SARS Coronavirus 2 NEGATIVE NEGATIVE Final    Comment: (NOTE) SARS-CoV-2 target nucleic acids are NOT DETECTED.  The SARS-CoV-2 RNA is generally detectable in upper and lower respiratory specimens during the acute phase of infection. Negative results do not preclude SARS-CoV-2 infection, do not rule out co-infections with other pathogens, and should not be used as the sole basis for treatment or other patient management decisions. Negative results must be combined with clinical  observations, patient history, and epidemiological information. The expected result is Negative.  Fact Sheet for Patients: SugarRoll.be  Fact Sheet for Healthcare Providers: https://www.woods-mathews.com/  This test is not yet approved or cleared by the [place] FDA and  has been authorized for detection and/or diagnosis of SARS-CoV-2 by FDA under an Emergency Use Authorization (EUA). This EUA will remain  in effect (meaning this test can be used) for the duration of the COVID-19 declaration under Se ction 564(b)(1) of the Act, 21 U.S.C. section 360bbb-3(b)(1), unless the authorization is terminated or revoked sooner.  Performed at [facility], [place] [address] [place], [place] [address]     Renal Function: No results for input(s): CREATININE in the last 168 hours. Estimated Creatinine Clearance: 85.1 mL/min (by C-G formula based on SCr of 0.87 mg/dL).  Radiologic Imaging: No results found.  I independently reviewed the above imaging studies.  Assessment and Plan Frank Castillo is a 70 y.o. male with unfavorable intermediate risk localized prostate cancer here for brachytherapy of the prostate, injection of SpaceOAR and cystoscopy. Ready to proceed.  Matt R. Brett Soza MD 06/28/2020, 12:34 PM  [facility] Pager: [phone]): [phone]

## 2020-06-28 NOTE — Anesthesia Preprocedure Evaluation (Signed)
Anesthesia Evaluation  Patient identified by MRN, date of birth, ID band Patient awake    Reviewed: Allergy & Precautions, NPO status , Patient's Chart, lab work & pertinent test results  Airway Mallampati: II       Dental  (+)    Pulmonary Current Smoker and Patient abstained from smoking.,    Pulmonary exam normal breath sounds clear to auscultation       Cardiovascular hypertension, + CAD  Normal cardiovascular exam Rhythm:Regular Rate:Normal     Neuro/Psych PSYCHIATRIC DISORDERS Anxiety Depression  Neuromuscular disease (right peroneal nerve palsy)    GI/Hepatic GERD  ,(+) Cirrhosis       ,   Endo/Other    Renal/GU    Prostate cancer    Musculoskeletal   Abdominal Normal abdominal exam  (+)   Peds  Hematology   Anesthesia Other Findings Tonsillar/mandibular cancer  Reproductive/Obstetrics                             Anesthesia Physical Anesthesia Plan  ASA: III  Anesthesia Plan: General   Post-op Pain Management:    Induction: Intravenous  PONV Risk Score and Plan:   Airway Management Planned: LMA  Additional Equipment:   Intra-op Plan:   Post-operative Plan: Extubation in OR  Informed Consent: I have reviewed the patients History and Physical, chart, labs and discussed the procedure including the risks, benefits and alternatives for the proposed anesthesia with the patient or authorized representative who has indicated his/her understanding and acceptance.     Dental advisory given  Plan Discussed with: CRNA, Anesthesiologist and Surgeon  Anesthesia Plan Comments:         Anesthesia Quick Evaluation

## 2020-06-28 NOTE — Anesthesia Procedure Notes (Signed)
Procedure Name: Intubation Date/Time: 06/28/2020 1:18 PM Performed by: Eulas Post, Philomene Haff W, CRNA Pre-anesthesia Checklist: Patient identified, Emergency Drugs available, Suction available and Patient being monitored Patient Re-evaluated:Patient Re-evaluated prior to induction Oxygen Delivery Method: Circle system utilized Preoxygenation: Pre-oxygenation with 100% oxygen Induction Type: IV induction Ventilation: Mask ventilation without difficulty Laryngoscope Size: Glidescope Grade View: Grade I Tube type: Oral Tube size: 7.5 mm Number of attempts: 1 Airway Equipment and Method: Stylet and Oral airway Placement Confirmation: ETT inserted through vocal cords under direct vision,  positive ETCO2 and breath sounds checked- equal and bilateral Secured at: 23 cm Tube secured with: Tape Dental Injury: Teeth and Oropharynx as per pre-operative assessment

## 2020-06-28 NOTE — Discharge Instructions (Signed)
Activity:  You are encouraged to ambulate frequently (about every hour during waking hours) to help prevent blood clots from forming in your legs or lungs.     Diet: You should advance your diet as instructed by your physician.  It will be normal to have some bloating, nausea, and abdominal discomfort intermittently.   Prescriptions:  You will be provided a prescription for pain medication to take as needed.  If your pain is not severe enough to require the prescription pain medication, you may take extra strength Tylenol instead which will have less side effects.  You should also take a prescribed stool softener to avoid straining with bowel movements as the prescription pain medication may constipate you.   What to call us about: You should call the office [phone]) if you develop fever > 101 or develop persistent vomiting. Activity:  You are encouraged to ambulate frequently (about every hour during waking hours) to help prevent blood clots from forming in your legs or lungs.      Brachytherapy for Prostate Cancer, Care After This sheet gives you information about how to care for yourself after your procedure. Your health care provider may also give you more specific instructions. If you have problems or questions, contact your health care provider. What can I expect after the procedure? After the procedure, it is common to have:  Urinary symptoms. These may include: ? Trouble passing urine. ? Blood in the urine or semen. ? Frequent feeling of an urgent need to urinate.  Constipation, nausea, or bloating and gas.  Bruising, swelling, and tenderness of the area beneath the scrotum (perineum).  Tiredness (fatigue).  Burning or pain in the rectum.  Problems getting or keeping an erection (erectile dysfunction). Follow these instructions at home: Eating and drinking  Drink enough fluid to keep your urine pale yellow.  Eat a healthy, balanced diet. This includes lean  proteins, whole grains, and plenty of fruits and vegetables.  If you drink alcohol: ? Limit how much you have to 0-2 drinks a day. ? Be aware of how much alcohol is in your drink. In the [place] one drink equals one 12 oz bottle of beer (355 mL), one 5 oz glass of wine (148 mL), or one 1 oz glass of hard liquor (44 mL).   Managing pain, stiffness, and swelling  If directed, put ice on the affected area. To do this: ? Put ice in a plastic bag. ? Place a towel between your skin and the bag. ? Leave the ice on for 20 minutes, 2-3 times a day.  Try not to sit directly on the perineum. A soft cushion can help with discomfort.   Activity  If you were given a sedative during the procedure, it can affect you for several hours. Do not drive or operate machinery until your health care provider says that it is safe.  Do not lift anything that is heavier than 10 lb (4.5 kg), or the limit that you are told, until your health care provider says that it is safe.  Rest as told by your health care provider.  Return to your normal activities as told by your health care provider. Most people can return to normal activities a few days or weeks after the procedure. Ask your health care provider what activities are safe for you.   Treatment area care Check your treatment area every day for signs of infection. Check for:  Redness, swelling, or pain.  Fluid or blood.  Warmth.  Pus or a bad smell. Managing constipation Your procedure may cause constipation. To prevent or treat constipation, you may need to:  Take over-the-counter or prescription medicines.  Eat foods that are high in fiber, such as beans, whole grains, and fresh fruits and vegetables.  Limit foods that are high in fat and processed sugars, such as fried or sweet foods. General instructions  Take over-the-counter and prescription medicines only as told by your health care provider.  Do not take baths, swim, or use a hot tub until  your health care provider approves. Shower and wash the perineum gently.  Do not have sex for one week after the treatment, or until your health care provider approves.  Do not use any products that contain nicotine or tobacco, such as cigarettes, e-cigarettes, and chewing tobacco. If you need help quitting, ask your health care provider.  If you have permanent, low-dose brachytherapy implants: ? Limit close contact with children and pregnant women for 2 months or as told by your health care provider. This is important because of the radiation that is still active in the prostate. ? You may set off radioactive sensors, such as at airport screenings. Ask your health care provider for a document that explains your treatment. ? You may be told to use a condom during sex for the first 2 months after low-dose brachytherapy.  Keep all follow-up visits as told by your health care provider. This is important. You may still need additional treatment. Contact a health care provider if you:  Have a fever or chills.  Have any of these signs of infection in the treatment area: ? Redness, swelling, or pain. ? Fluid or blood. ? Warmth. ? Pus or a bad smell.  Have no bowel movements for 3-4 days after the procedure.  Have diarrhea for 3-4 days after the procedure.  Develop any new symptoms, such as problems with urinating or erectile dysfunction.  Have pain in your abdomen.  Have more blood in your urine.  Have swelling or pain in your legs. Get help right away if:  You cannot urinate.  You have a lot of bleeding from your rectum.  You have unusual drainage coming from your rectum.  You have severe pain in the treated area that does not go away with pain medicine.  You have severe nausea or vomiting.  You have difficulty breathing. Summary  Talk with your health care provider about your risk of brachytherapy side effects, such as erectile dysfunction or urinary problems.  If you  have permanent, low-dose brachytherapy implants, limit close contact with children and pregnant women for 2 months or as told by your health care provider. This is important because of the radiation that is still active in the prostate.  You may be told to use a condom during sex for the first 2 months after low-dose brachytherapy.  Keep all follow-up visits as told by your health care provider. This is important. You may need additional treatment. This information is not intended to replace advice given to you by your health care provider. Make sure you discuss any questions you have with your health care provider. Document Revised: 03/13/2019 Document Reviewed: 03/13/2019 Elsevier Patient Education  2021 Sec[employer] Instructions  Activity: Get plenty of rest for the remainder of the day. A responsible adult should stay with you for 24 hours following the procedure.  For the next 24 hours, DO NOT: -Drive a car -Paediatric nurse -Drink alcoholic beverages -Take  any medication unless instructed by your physician -Make any legal decisions or sign important papers.  Meals: Start with liquid foods such as gelatin or soup. Progress to regular foods as tolerated. Avoid greasy, spicy, heavy foods. If nausea and/or vomiting occur, drink only clear liquids until the nausea and/or vomiting subsides. Call your physician if vomiting continues.  Special Instructions/Symptoms: Your throat may feel dry or sore from the anesthesia or the breathing tube placed in your throat during surgery. If this causes discomfort, gargle with warm salt water. The discomfort should disappear within 24 hours.  If you had a scopolamine patch placed behind your ear for the management of post- operative nausea and/or vomiting:  1. The medication in the patch is effective for 72 hours, after which it should be removed.  Wrap patch in a tissue and discard in the trash. Wash hands thoroughly with  soap and water. 2. You may remove the patch earlier than 72 hours if you experience unpleasant side effects which may include dry mouth, dizziness or visual disturbances. 3. Avoid touching the patch. Wash your hands with soap and water after contact with the patch.

## 2020-06-28 NOTE — Op Note (Signed)
PATIENT:  Frank Castillo  PRE-OPERATIVE DIAGNOSIS:  Adenocarcinoma of the prostate  POST-OPERATIVE DIAGNOSIS:  Same  PROCEDURE:  1. I-125 radioactive seed implantation 2. Cystoscopy  3. Placement of SpaceOAR  SURGEON:  Rexene Alberts, MD  Radiation oncologist: Tyler Pita, MD  ANESTHESIA:  General  EBL:  Minimal  DRAINS: None  INDICATION: Frank Castillo  Description of procedure: After informed consent the patient was brought to the major OR, placed on the table and administered general anesthesia. He was then moved to the modified lithotomy position with his perineum perpendicular to the floor. His perineum and genitalia were then sterilely prepped. An official timeout was then performed. A 16 French Foley catheter was then placed in the bladder and filled with dilute contrast, a rectal tube was placed in the rectum and the transrectal ultrasound probe was placed in the rectum and affixed to the stand. He was then sterilely draped.  Real time ultrasonography was used along with the seed planning software. This was used to develop the seed plan including the number of needles as well as number of seeds required for complete and adequate coverage. Real-time ultrasonography was then used along with the previously developed plan and the Nucletron device to implant a total of 70 seeds using 19 needles. This proceeded without difficulty or complication.   I then proceeded with placement of SpaceOAR by introducing a needle with the bevel angled inferiorly approximately 2 cm superior to the anus. This was angled downward and under direct ultrasound was placed within the space between the prostatic capsule and rectum. This was confirmed with a small amount of sterile saline injected and this was performed under direct ultrasound. I then attached the SpaceOAR to the needle and injected this in the space between the prostate and rectum with good placement noted.  A Foley catheter was then  removed as well as the transrectal ultrasound probe and rectal probe. Flexible cystoscopy was then performed using the 16 French flexible scope which revealed a normal urethra throughout its length down to the sphincter which appeared intact. The prostatic urethra revealed bilobar hypertrophy but no evidence of obstruction, seeds, spacers or lesions. The bladder was then entered and fully and systematically inspected. The ureteral orifices were noted to be of normal configuration and position. The mucosa revealed no evidence of tumors. There were also no stones identified within the bladder. I noted no seeds or spacers on the floor of the bladder and retroflexion of the scope revealed no seeds protruding from the base of the prostate.  The cystoscope was then removed and the patient was awakened and taken to recovery room in stable and satisfactory condition. He tolerated procedure well and there were no intraoperative complications.  [facility]  Pager: [phone]

## 2020-06-28 NOTE — Anesthesia Procedure Notes (Signed)
Procedure Name: LMA Insertion Date/Time: 06/28/2020 1:16 PM Performed by: Eulas Post, Tsugio Elison W, CRNA Pre-anesthesia Checklist: Patient identified, Emergency Drugs available, Suction available and Patient being monitored Patient Re-evaluated:Patient Re-evaluated prior to induction Oxygen Delivery Method: Circle System Utilized Preoxygenation: Pre-oxygenation with 100% oxygen Induction Type: IV induction Ventilation: Mask ventilation without difficulty LMA: LMA with gastric port inserted and LMA inserted LMA Size: 4.0 Number of attempts: 1 Airway Equipment and Method: Bite block Placement Confirmation: positive ETCO2 Tube secured with: Tape Dental Injury: Teeth and Oropharynx as per pre-operative assessment  Difficulty Due To: Difficulty was anticipated and Difficult Airway- due to reduced neck mobility Future Recommendations: Recommend- induction with short-acting agent, and alternative techniques readily available Comments: Attempted regular LMA and supreme, neither would seal. BMV and moved to RSI with glide scope. Grade one view, easy to pass ETT. (Pt s/p mandible removal to left side as well as radation.

## 2020-06-28 NOTE — Transfer of Care (Signed)
Immediate Anesthesia Transfer of Care Note  Patient: Frank Castillo  Procedure(s) Performed: RADIOACTIVE SEED IMPLANT/BRACHYTHERAPY IMPLANT WITH CYSTOSCOPY (N/A Prostate) SPACE OAR INSTILLATION (N/A Prostate)  Patient Location: PACU  Anesthesia Type:General  Level of Consciousness: awake  Airway & Oxygen Therapy: Patient Spontanous Breathing and Patient connected to face mask oxygen  Post-op Assessment: Report given to RN and Post -op Vital signs reviewed and stable  Post vital signs: Reviewed and stable  Last Vitals:  Vitals Value Taken Time  BP 124/73 06/28/20 1441  Temp    Pulse 86 06/28/20 1444  Resp 18 06/28/20 1444  SpO2 97 % 06/28/20 1444  Vitals shown include unvalidated device data.  Last Pain:  Vitals:   06/28/20 1134  TempSrc: Oral  PainSc: 0-No pain      Patients Stated Pain Goal: 4 (19/50/93 2671)  Complications: No complications documented.

## 2020-06-29 NOTE — Progress Notes (Signed)
  Radiation Oncology         [phone] ________________________________  Name: Frank Castillo MRN: [id number]  Date: 06/29/2020  DOB: [birth year]       Prostate Seed Implant  [id number], Frank Schneiders, MD  No ref. provider found  DIAGNOSIS:  Oncology History  Tonsil cancer ([place])  08/02/2015 Initial Diagnosis   Cancer of tonsillar fossa ([place])   08/20/2015 Procedure   Port-a-cath placed by IR   08/20/2015 Procedure   G-tube placed by IR   08/21/2015 - 10/09/2015 Radiation Therapy   70 Gy, Dr. Isidore Moos.   08/21/2015 - 09/10/2015 Chemotherapy   Cisplatin every 21 days with XRT.   08/28/2015 Adverse Reaction   Tinnitis, Cisplatin-induced.   09/11/2015 - 09/30/2015 Chemotherapy   Carboplatin/5FU   09/20/2015 - 09/21/2015 Hospital Admission   Intractable nausea/vomiting. Severe Mucositis   10/17/2015 - 10/20/2015 Hospital Admission   Esophageal/oral candidasis. Shingles. Fever. Chemotherapy induced pancytopenia   01/24/2016 PET scan   Resolution of the left tonsillar mass and left neck adenopathy. No findings for residual or locally recurrent tumor, adenopathy or distant metastatic disease.   03/28/2016 - 04/08/2016 Hospital Admission   Admitted with severe abdominal pain. Found to diffuse peritonitis from perforated sigmoid colon requiring colostomy. Discharged to [facility] in [place].           ICD-10-CM   1. Prostate cancer [facility])  [place] Discharge patient    PROCEDURE: Insertion of radioactive I-125 seeds into the prostate gland.  RADIATION DOSE: 145 Gy, definitive/boost therapy.  TECHNIQUE: Frank Castillo was brought to the operating room with the urologist. He was placed in the dorsolithotomy position. He was catheterized and a rectal tube was inserted. The perineum was shaved, prepped and draped. The ultrasound probe was then introduced into the rectum to see the prostate gland.  TREATMENT DEVICE: A needle grid was attached to the ultrasound probe stand and anchor needles were  placed.  3D PLANNING: The prostate was imaged in 3D using a sagittal sweep of the prostate probe. These images were transferred to the planning computer. There, the prostate, urethra and rectum were defined on each axial reconstructed image. Then, the software created an optimized 3D plan and a few seed positions were adjusted. The quality of the plan was reviewed using [facility] information for the target and the following two organs at risk:  Urethra and Rectum.  Then the accepted plan was printed and handed off to the radiation therapist.  Under my supervision, the custom loading of the seeds and spacers was carried out and loaded into sealed vicryl sleeves.  These pre-loaded needles were then placed into the needle holder.Marland Kitchen  PROSTATE VOLUME STUDY:  Using transrectal ultrasound the volume of the prostate was verified to be 55.3 cc.  SPECIAL TREATMENT PROCEDURE/SUPERVISION AND HANDLING: The pre-loaded needles were then delivered under sagittal guidance. A total of 19 needles were used to deposit 70 seeds in the prostate gland. The individual seed activity was 0.525 mCi.  SpaceOAR:  Yes  COMPLEX SIMULATION: At the end of the procedure, an anterior radiograph of the pelvis was obtained to document seed positioning and count. Cystoscopy was performed to check the urethra and bladder.  MICRODOSIMETRY: At the end of the procedure, the patient was emitting 0.096 mR/hr at 1 meter. Accordingly, he was considered safe for hospital discharge.  PLAN: The patient will return to the radiation oncology clinic for post implant CT dosimetry in three weeks.   ________________________________  Frank Castillo, M.D.

## 2020-06-30 NOTE — Anesthesia Postprocedure Evaluation (Signed)
Anesthesia Post Note  Patient: Frank Castillo  Procedure(s) Performed: RADIOACTIVE SEED IMPLANT/BRACHYTHERAPY IMPLANT WITH CYSTOSCOPY (N/A Prostate) SPACE OAR INSTILLATION (N/A Prostate)     Patient location during evaluation: PACU Anesthesia Type: General Level of consciousness: awake Pain management: pain level controlled Vital Signs Assessment: post-procedure vital signs reviewed and stable Respiratory status: spontaneous breathing and respiratory function stable Cardiovascular status: stable Postop Assessment: no apparent nausea or vomiting Anesthetic complications: no   No complications documented.  Last Vitals:  Vitals:   06/28/20 1515 06/28/20 1600  BP: 120/71 115/69  Pulse: 81 78  Resp: 11 12  Temp:  (!) 36.4 C  SpO2: 92% 98%    Last Pain:  Vitals:   06/28/20 1600  TempSrc:   PainSc: 0-No pain                 Merlinda Frederick

## 2020-07-01 ENCOUNTER — Encounter (HOSPITAL_BASED_OUTPATIENT_CLINIC_OR_DEPARTMENT_OTHER): Payer: Self-pay | Admitting: Urology

## 2020-07-05 DIAGNOSIS — R3916 Straining to void: Secondary | ICD-10-CM | POA: Diagnosis not present

## 2020-07-05 DIAGNOSIS — R300 Dysuria: Secondary | ICD-10-CM | POA: Diagnosis not present

## 2020-07-05 DIAGNOSIS — R3915 Urgency of urination: Secondary | ICD-10-CM | POA: Diagnosis not present

## 2020-07-12 DIAGNOSIS — R3915 Urgency of urination: Secondary | ICD-10-CM | POA: Diagnosis not present

## 2020-07-17 ENCOUNTER — Encounter: Payer: Self-pay | Admitting: Orthopaedic Surgery

## 2020-07-17 ENCOUNTER — Ambulatory Visit (INDEPENDENT_AMBULATORY_CARE_PROVIDER_SITE_OTHER): Payer: Medicare Other | Admitting: Orthopaedic Surgery

## 2020-07-17 ENCOUNTER — Other Ambulatory Visit: Payer: Self-pay

## 2020-07-17 VITALS — Ht 70.0 in | Wt 174.0 lb

## 2020-07-17 DIAGNOSIS — G5731 Lesion of lateral popliteal nerve, right lower limb: Secondary | ICD-10-CM | POA: Diagnosis not present

## 2020-07-17 NOTE — Progress Notes (Signed)
Office Visit Note   Patient: Frank Castillo           Date of Birth: 10-Mar-1951           MRN: [id number] Visit Date: 07/17/2020              Requested by: Caren Macadam, MD [address] [place],  [place] [id number] PCP: Caren Macadam, MD   Assessment & Plan: Visit Diagnoses:  1. Peroneal nerve palsy, right     Plan: Right peroneal nerve palsy appears to be improving.  Now has good strength with eversion of his right foot as well as dorsiflexion.  Has good sensation to the dorsum of the right foot in the distribution of the deep and superficial peroneal nerves.  There is a Tinel's diffusely from the mid calf laterally to the ankle.  All of the above is consistent with progressive return of function of the peroneal nerve.  No obvious limitation of activities or compromise of function.  I think at this point he is fine just to continue to new with his present activity return as needed.  Follow-Up Instructions: Return if symptoms worsen or fail to improve.   Orders:  No orders of the defined types were placed in this encounter.  No orders of the defined types were placed in this encounter.     Procedures: No procedures performed   Clinical Data: No additional findings.   Subjective: Chief Complaint  Patient presents with  . Right Leg - Follow-up  Patient presents today for follow up on his right leg. He states that he has noticed improvement since his last visit. He has been doing home exercises and walking.   HPI  Review of Systems   Objective: Vital Signs: Ht 5\' 10"  (1.778 m)   Wt 174 lb (78.9 kg)   BMI 24.97 kg/m   Physical Exam Constitutional:      Appearance: He is well-developed and well-nourished.  HENT:     Mouth/Throat:     Mouth: Oropharynx is clear and moist.  Eyes:     Extraocular Movements: EOM normal.     Pupils: Pupils are equal, round, and reactive to light.  Pulmonary:     Effort: Pulmonary effort is normal.  Skin:    General: Skin  is warm and dry.  Neurological:     Mental Status: He is alert and oriented to person, place, and time.  Psychiatric:        Mood and Affect: Mood and affect normal.        Behavior: Behavior normal.     Ortho Exam awake alert and oriented x3.  Comfortable sitting.  Does not drag his right foot.  Has good strong dorsiflexion of the right foot and good strength with eversion of the foot.  Peroneal tendon function is intact.  Has good sensation of the dorsum of the foot.  No swelling.  +1 pulses.  No calf pain.  There is a Tinel's along the peroneal nerve starting at the mid lateral leg ending distally. Specialty Comments:  No specialty comments available.  Imaging: No results found.   PMFS History: Patient Active Problem List   Diagnosis Date Noted  . Peroneal nerve palsy, right 06/05/2020  . Malignant neoplasm of prostate ([place]) 04/05/2020  . 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 GERD (gastroesophageal reflux disease) 09/13/2017  . SCC (squamous cell carcinoma) 08/27/2017  . 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 Hyperlipidemia, mixed 06/17/2017  . Incisional hernia, without obstruction or gangrene 04/09/2017  . Melanoma ([place])   . Hernia of abdominal wall 01/22/2017  . Anxiety 01/22/2017  . Depression, major, single episode, in partial remission ([place]) 01/22/2017  . History of alcohol abuse 01/22/2017  . Esophagitis 09/29/2016  . Colostomy in place [facility]) 09/02/2016  . Odynophagia 06/29/2016  . Colon cancer screening 06/29/2016  . Dysphagia 06/29/2016  . H/O colostomy 06/29/2016  . Iron deficiency anemia 05/07/2016  . Perforated sigmoid colon ([place]) 03/28/2016  . Reactive depression (situ[place]) 02/29/2016  . 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 Tobacco use disorder 02/27/2016  . Tinnitus 02/27/2016  . Dysuria 10/25/2015  . Decreased oral intake 10/25/2015  .  Malnutrition of moderate degree 10/19/2015  . Antineoplastic chemotherapy induced pancytopenia ([place]) 10/18/2015  . Fever   . Candidiasis of mouth and esophagus ([place]) 10/17/2015  . Neutropenia, febrile ([place]) 10/17/2015  . Shingles rash 10/17/2015  . Nausea with vomiting 09/20/2015  . Hyponatremia 09/20/2015  . Oral pharyngeal candidiasis 09/20/2015  . Antineoplastic chemotherapy induced anemia 09/20/2015  . Hypertension   . Portal hypertension ([place])   . Tonsil cancer ([place]) 08/02/2015   Past Medical History:  Diagnosis Date  . 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 Cirrhosis ([place])    Followed at [facility]; completed Hep A and B vaccine 05/2019 sees dr Sydell Axon Mariel Kansky  . Complication of anesthesia    neck stiffness  . Essential hypertension   . GERD (gastroesophageal reflux disease)   . History of alcohol abuse   . History of bowel resection   . History of tonsillectomy    Dr. Benjamine Mola, 06/2015  . Iron deficiency anemia 05/07/2016  . Melanoma ([place]) 1994   right calf  . Mixed hyperlipidemia   . Portal hypertension ([place])   . Prostate cancer ([place])   . Tonsillar cancer ([place]) 06/2015   SCC chemo and radiation done  . Wears contact lenses    in left eye    Family History  Problem Relation Age of Onset  . Macular degeneration Mother   . Dementia Mother   . Alcoholism Father   . Prostate cancer Father   . Diabetes Maternal Aunt   . Diabetes Maternal Uncle   . Prostate cancer Maternal Uncle   . Mental illness Sister   . Pancreatic cancer Neg Hx   . Breast cancer Neg Hx   . Colon cancer Neg Hx     Past Surgical History:  Procedure Laterality Date  . BIOPSY  07/02/2016   Procedure: BIOPSY;  Surgeon: Daneil Dolin, MD;  [facility];  Service: Endoscopy;;  esophageal  . COLON SURGERY  2017   ruptured colon  . COLONOSCOPY WITH PROPOFOL N/A 07/02/2016   Procedure: COLONOSCOPY WITH PROPOFOL;  Surgeon: Daneil Dolin, MD;  [facility];  Service:  Endoscopy;  Laterality: N/A;  9:45am  . COLOSTOMY TAKEDOWN N/A 09/02/2016   Procedure: LAPAROSCOPIC COLOSTOMY REVERSAL;  Surgeon: Leighton Ruff, MD;  Location: [facility];  Service: General;  Laterality: N/A;  . ESOPHAGOGASTRODUODENOSCOPY (EGD) WITH PROPOFOL N/A 07/02/2016   Procedure: ESOPHAGOGASTRODUODENOSCOPY (EGD) WITH PROPOFOL;  Surgeon: Daneil Dolin, MD;  [facility];  Service: Endoscopy;  Laterality: N/A;  . ESOPHAGOGASTRODUODENOSCOPY (EGD) WITH PROPOFOL N/A 12/28/2019   Procedure: ESOPHAGOGASTRODUODENOSCOPY (EGD) WITH PROPOFOL;  Surgeon: Daneil Dolin, MD;  [facility];  Service: Endoscopy;  Laterality: N/A;  11:00am  . HERNIA REPAIR Right 2019   [facility]  . INCISIONAL HERNIA REPAIR  09/02/2016   Procedure: HERNIA REPAIR INCISIONAL;  Surgeon: Leighton Ruff, MD;  [facility];  Service: General;;  . LAPAROTOMY N/A 03/28/2016   Procedure: EXPLORATORY LAPAROTOMY, SIGMOID COLECTOMY, COLOSTOMY;  Surgeon: Leighton Ruff, MD;  [facility];  Service: General;  Laterality: N/A;  [name] DILATION N/A 07/02/2016   Procedure: Keturah Shavers;  Surgeon: Daneil Dolin, MD;  [facility];  Service: Endoscopy;  Laterality: N/A;  . MOUTH SURGERY  2019   left lower jaw  . PORT-A-CATH REMOVAL  09/02/2016   Procedure: REMOVAL PORT-A-CATH;  Surgeon: Leighton Ruff, MD;  [facility];  Service: General;;  . PROSTATE BIOPSY  2021  . RADIOACTIVE SEED IMPLANT N/A 06/28/2020   Procedure: RADIOACTIVE SEED IMPLANT/BRACHYTHERAPY IMPLANT WITH CYSTOSCOPY;  Surgeon: Janith Lima, MD;  Location: [facility];  Service: Urology;  Laterality: N/A;  . REMOVAL OF GASTROSTOMY TUBE N/A 09/02/2016   Procedure: REMOVAL OF GASTROSTOMY TUBE;  Surgeon: Leighton Ruff, MD;  [facility];  Service: General;  Laterality: N/A;  . SPACE OAR INSTILLATION N/A 06/28/2020   Procedure: SPACE OAR INSTILLATION;  Surgeon: Janith Lima, MD;  Location: [facility];  Service: Urology;  Laterality: N/A;  . TONSILLECTOMY Left 07/09/2015   Procedure: LEFT TONSILLECTOMY;  Surgeon: Leta Baptist, MD;  Location: [facility];  Service: ENT;  Laterality: Left;  . TONSILLECTOMY  2017   left tonsil  . Z-PLASTY SCAR REVISION N/A 09/02/2016   Procedure: SCAR REVISION;  Surgeon: Leighton Ruff, MD;  [facility];  Service: General;  Laterality: N/A;   Social History   Occupational History  . Not on file  Tobacco Use  . Smoking status: Current Some Day Smoker    Packs/day: 0.50    Years: 30.00    Pack years: 15.00    Types: Cigarettes    Last attempt to quit: 11/22/2016    Years since quitting: 3.6  . Smokeless tobacco: Never Used  . Tobacco comment: smoke social occ  Vaping Use  . Vaping Use: Never used  Substance and Sexual Activity  . Alcohol use: No    Alcohol/week: 0.0 standard drinks    Comment: former abuse- quit 09/2011  . Drug use: No  . Sexual activity: Not on file

## 2020-07-19 DIAGNOSIS — R3916 Straining to void: Secondary | ICD-10-CM | POA: Diagnosis not present

## 2020-07-19 DIAGNOSIS — R300 Dysuria: Secondary | ICD-10-CM | POA: Diagnosis not present

## 2020-07-24 ENCOUNTER — Telehealth: Payer: Self-pay | Admitting: *Deleted

## 2020-07-24 NOTE — Telephone Encounter (Signed)
Called patient to remind of post seed appts. for 07-25-20, lvm for a return call

## 2020-07-24 NOTE — Progress Notes (Signed)
  Radiation Oncology         [phone]) [phone] ________________________________  Name: Frank Castillo MRN: [id number]  Date: 07/25/2020  DOB: 15-Dec-1950  COMPLEX SIMULATION NOTE  NARRATIVE:  The patient was brought to the [facility] today following prostate seed implantation approximately one month ago.  Identity was confirmed.  All relevant records and images related to the planned course of therapy were reviewed.  Then, the patient was set-up supine.  CT images were obtained.  The CT images were loaded into the planning software.  Then the prostate and rectum were contoured.  Treatment planning then occurred.  The implanted iodine 125 seeds were identified by the physics staff for projection of radiation distribution  I have requested : 3D Simulation  I have requested a DVH of the following structures: Prostate and rectum.    ________________________________  Sheral Apley Tammi Klippel, M.D.

## 2020-07-24 NOTE — Progress Notes (Signed)
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    ICD-10-CM   1. Malignant neoplasm of prostate (HCC)  C61     Interval Since Last Radiation:  3.5 weeks 06/28/20:  Insertion of radioactive I-125 seeds into the prostate gland; 145 Gy, definitive therapy with placement of SpaceOAR gel.  Narrative:  The patient returns today for routine follow-up.  He is complaining of increased urinary frequency and urinary hesitation symptoms. He filled out a questionnaire regarding urinary function today providing and overall IPSS score of 35 characterizing his symptoms as severe but admits that he has seen some gradual improvement this week.  His pre-implant score was 13.  He has been seen in the urology office with Jiles Crocker, NP on several occasions since the time of his procedure and each time, has not shown any evidence of infection and appears to be emptying his bladder adequately.  He tried doubling the dose of Flomax in the evening but did not notice much if any improvement so more recently, he was switched to Rapaflo and reports that last night was his best night in 4 weeks.  He continues taking AZO for intermittent dysuria and is occasionally using hydrocodone for pelvic/base of penis pain.  He reports a decent appetite and is maintaining his weight.  He denies any abdominal pain or bowel symptoms aside from occasional constipation.  He has been using stool softeners as needed.  ALLERGIES:  is allergic to bee venom, other, and tobramycin ophthalmic [tobramycin].  Meds: Current Outpatient Medications  Medication Sig Dispense Refill   aspirin EC 81 MG tablet Take 1 tablet (81 mg total) by mouth daily.      atorvastatin (LIPITOR) 40 MG tablet Take 1 tablet (40 mg total) by mouth daily. (Patient taking differently: Take 40 mg by mouth at bedtime.) 90 tablet 0   cetirizine (ZYRTEC) 10 MG tablet Take 10 mg by mouth daily as needed for allergies.      CHANTIX STARTING MONTH PAK 0.5 MG X 11 & 1 MG X 42 tablet Take 1 mg by mouth 2 (two) times daily.     chlorhexidine (PERIDEX) 0.12 % solution 15 mLs as needed.     dexlansoprazole (DEXILANT) 60 MG capsule Take 1 capsule (60 mg total) by mouth daily. 30 capsule 2   docusate sodium (COLACE) 100 MG capsule Take 1 capsule (100 mg total) by mouth daily as needed for up to 30 doses. 30 capsule 0   DULoxetine (CYMBALTA) 60 MG capsule Take 120 mg by mouth daily.     fluticasone (FLONASE) 50 MCG/ACT nasal spray Place 2 sprays into both nostrils daily.     lisinopril (PRINIVIL,ZESTRIL) 10 MG tablet Take 1 tablet (10 mg total) by mouth daily. 90 tablet 3   Melatonin 10 MG TABS Take 1 tablet by mouth as needed.      MULTIPLE VITAMINS-MINERALS ER PO Take 1 tablet by mouth daily.      oxyCODONE-acetaminophen (PERCOCET) 5-325 MG tablet Take 1 tablet by mouth every 4 (four) hours as needed for up to 18 doses for severe pain. 18 tablet 0   sodium fluoride (FLUORISHIELD) 1.1 % GEL dental gel Instill one drop of gel per  tooth space of fluoride tray. Place over teeth for 5 minutes. Remove. Spit out excess. Repeat nightly. 125 mL 11   tamsulosin (FLOMAX) 0.4 MG CAPS capsule Take 0.4 mg by mouth daily.     triamcinolone cream (KENALOG) 0.1 % Apply 1 application topically daily.  2   No current facility-administered medications for this visit.   Facility-Administered Medications Ordered in Other Visits  Medication Dose Route Frequency Provider Last Rate Last Admin   0.9 %  sodium chloride infusion   Intravenous Continuous Kefalas, Manon Hilding, PA-C   New Bag at 03/28/16 1754   0.9 %  sodium chloride infusion   Intravenous Continuous Kefalas, Manon Hilding, PA-C        0.9 %  sodium chloride infusion   Intravenous Continuous Penland, Kelby Fam, MD       0.9 %  sodium chloride infusion   Intravenous Continuous Penland, Kelby Fam, MD       0.9 %  sodium chloride infusion   Intravenous Continuous Penland, Kelby Fam, MD        Physical Findings: In general this is a well appearing Caucasian male in no acute distress. He's alert and oriented x4 and appropriate throughout the examination. Cardiopulmonary assessment is negative for acute distress and he exhibits normal effort.   Lab Findings: Lab Results  Component Value Date   WBC 7.6 06/20/2020   HGB 14.1 06/20/2020   HCT 46.0 06/20/2020   MCV 88.3 06/20/2020   PLT 297 06/20/2020    Radiographic Findings:  Patient underwent CT imaging in our clinic for post implant dosimetry. The CT will be reviewed by Dr. Tammi Klippel to confirm there is an adequate distribution of radioactive seeds throughout the prostate gland and ensure that there are no seeds in or near the rectum.  We suspect the final radiation plan and dosimetry will show appropriate coverage of the prostate gland. He understands that we will call and inform him of any unexpected findings on further review of his imaging and dosimetry.  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 The patient is recovering from the effects of radiation. His urinary symptoms should gradually improve over the next 4-6 months. We talked about this today. He is encouraged by his improvement already and is otherwise pleased with his outcome. We also talked about long-term follow-up for prostate cancer following seed implant. He understands that ongoing PSA determinations and digital rectal exams will help perform surveillance to rule out disease recurrence. He has a follow up appointment scheduled with Dr. Abner Greenspan on 10/10/20. He understands what to expect with his PSA measures. Patient was also  educated today about some of the long-term effects from radiation including a small risk for rectal bleeding and possibly erectile dysfunction. We talked about some of the general management approaches to these potential complications. However, I did encourage the patient to contact our office or return at any point if he has questions or concerns related to his previous radiation and prostate cancer.    Nicholos Johns, PA-C

## 2020-07-25 ENCOUNTER — Ambulatory Visit
Admission: RE | Admit: 2020-07-25 | Discharge: 2020-07-25 | Disposition: A | Discharge status: Home / Self Care | Payer: Medicare Other | Source: Ambulatory Visit | Attending: Radiation Oncology | Admitting: Radiation Oncology

## 2020-07-25 ENCOUNTER — Other Ambulatory Visit: Payer: Self-pay

## 2020-07-25 ENCOUNTER — Encounter: Payer: Self-pay | Admitting: Urology

## 2020-07-25 ENCOUNTER — Ambulatory Visit
Admission: RE | Admit: 2020-07-25 | Discharge: 2020-07-25 | Disposition: A | Discharge status: Home / Self Care | Payer: Medicare Other | Source: Ambulatory Visit | Attending: Urology | Admitting: Urology

## 2020-07-25 VITALS — BP 117/75 | HR 94 | Temp 96.9°F | Resp 18 | Ht 70.0 in | Wt 172.2 lb

## 2020-07-25 DIAGNOSIS — R300 Dysuria: Secondary | ICD-10-CM | POA: Insufficient documentation

## 2020-07-25 DIAGNOSIS — C61 Malignant neoplasm of prostate: Secondary | ICD-10-CM

## 2020-07-25 DIAGNOSIS — K5900 Constipation, unspecified: Secondary | ICD-10-CM | POA: Insufficient documentation

## 2020-07-25 DIAGNOSIS — Z79899 Other long term (current) drug therapy: Secondary | ICD-10-CM | POA: Insufficient documentation

## 2020-07-25 DIAGNOSIS — R350 Frequency of micturition: Secondary | ICD-10-CM | POA: Insufficient documentation

## 2020-07-25 DIAGNOSIS — Z923 Personal history of irradiation: Secondary | ICD-10-CM | POA: Insufficient documentation

## 2020-07-25 DIAGNOSIS — R3911 Hesitancy of micturition: Secondary | ICD-10-CM | POA: Insufficient documentation

## 2020-07-25 DIAGNOSIS — Z7982 Long term (current) use of aspirin: Secondary | ICD-10-CM | POA: Insufficient documentation

## 2020-08-07 DIAGNOSIS — R300 Dysuria: Secondary | ICD-10-CM | POA: Diagnosis not present

## 2020-08-15 DIAGNOSIS — Z85819 Personal history of malignant neoplasm of unspecified site of lip, oral cavity, and pharynx: Secondary | ICD-10-CM | POA: Diagnosis not present

## 2020-08-15 DIAGNOSIS — H6123 Impacted cerumen, bilateral: Secondary | ICD-10-CM | POA: Diagnosis not present

## 2020-08-21 DIAGNOSIS — F1911 Other psychoactive substance abuse, in remission: Secondary | ICD-10-CM | POA: Diagnosis not present

## 2020-08-21 DIAGNOSIS — Z720 Tobacco use: Secondary | ICD-10-CM | POA: Diagnosis not present

## 2020-08-21 DIAGNOSIS — I2510 Atherosclerotic heart disease of native coronary artery without angina pectoris: Secondary | ICD-10-CM | POA: Diagnosis not present

## 2020-08-21 DIAGNOSIS — R7303 Prediabetes: Secondary | ICD-10-CM | POA: Diagnosis not present

## 2020-08-21 DIAGNOSIS — I10 Essential (primary) hypertension: Secondary | ICD-10-CM | POA: Diagnosis not present

## 2020-08-21 DIAGNOSIS — E782 Mixed hyperlipidemia: Secondary | ICD-10-CM | POA: Diagnosis not present

## 2020-08-21 DIAGNOSIS — F321 Major depressive disorder, single episode, moderate: Secondary | ICD-10-CM | POA: Diagnosis not present

## 2020-08-26 ENCOUNTER — Telehealth: Payer: Self-pay | Admitting: Internal Medicine

## 2020-08-26 NOTE — Telephone Encounter (Signed)
Letter mailed

## 2020-08-26 NOTE — Telephone Encounter (Signed)
RECALL FOR ULTRASOUND 

## 2020-09-03 ENCOUNTER — Other Ambulatory Visit: Payer: Self-pay | Admitting: Internal Medicine

## 2020-09-04 ENCOUNTER — Encounter: Payer: Self-pay | Admitting: Radiation Oncology

## 2020-09-04 ENCOUNTER — Telehealth: Payer: Self-pay | Admitting: Internal Medicine

## 2020-09-04 ENCOUNTER — Ambulatory Visit
Admission: RE | Admit: 2020-09-04 | Discharge: 2020-09-04 | Disposition: A | Discharge status: Home / Self Care | Payer: Medicare Other | Source: Ambulatory Visit | Attending: Radiation Oncology | Admitting: Radiation Oncology

## 2020-09-04 DIAGNOSIS — C61 Malignant neoplasm of prostate: Secondary | ICD-10-CM | POA: Insufficient documentation

## 2020-09-04 DIAGNOSIS — K7030 Alcoholic cirrhosis of liver without ascites: Secondary | ICD-10-CM

## 2020-09-04 NOTE — Telephone Encounter (Signed)
DEXILANT NEEDS TO BE SENT TO [facility]

## 2020-09-04 NOTE — Telephone Encounter (Signed)
[place] abd RUQ scheduled for 09/25/20 at 9:30am, arrive at 9:15am. NPO after midnight prior to test.  Called and informed pt of [place] appt. Letter mailed.

## 2020-09-04 NOTE — Addendum Note (Signed)
Addended by: Hassan Rowan on: 09/04/2020 10:43 AM   Modules accepted: Orders

## 2020-09-04 NOTE — Telephone Encounter (Signed)
Dexilant 60 mg daily

## 2020-09-04 NOTE — Telephone Encounter (Signed)
Patient received letter to schedule ultrasound  

## 2020-09-05 MED ORDER — DEXILANT 60 MG PO CPDR: 60.0000 mg | DELAYED_RELEASE_CAPSULE | Freq: Every day | ORAL | 11 refills | Status: DC

## 2020-09-05 NOTE — Addendum Note (Signed)
Addended by: Mahala Menghini on: 09/05/2020 01:06 PM   Modules accepted: Orders

## 2020-09-05 NOTE — Telephone Encounter (Signed)
Received a call from [facility]. They need a new RX for Dexilant 60 mg. It needs to say DAW1 (Dispense as written)ibuprofen think they are asking this now due to [place] going generic. A lot of our pts don't like the generic Dexilant.

## 2020-09-05 NOTE — Telephone Encounter (Signed)
RX done. 

## 2020-09-05 NOTE — Telephone Encounter (Signed)
Noted  

## 2020-09-25 ENCOUNTER — Other Ambulatory Visit: Payer: Self-pay

## 2020-09-25 ENCOUNTER — Ambulatory Visit (HOSPITAL_COMMUNITY)
Admission: RE | Admit: 2020-09-25 | Discharge: 2020-09-25 | Disposition: A | Discharge status: Home / Self Care | Payer: Medicare Other | Source: Ambulatory Visit | Attending: Internal Medicine | Admitting: Internal Medicine

## 2020-09-25 DIAGNOSIS — K7030 Alcoholic cirrhosis of liver without ascites: Secondary | ICD-10-CM

## 2020-09-25 DIAGNOSIS — K7460 Unspecified cirrhosis of liver: Secondary | ICD-10-CM | POA: Diagnosis not present

## 2020-09-30 NOTE — Progress Notes (Signed)
  Radiation Oncology         [phone] ________________________________  Name: Frank Castillo MRN: [id number]  Date: 09/04/2020  DOB: 1950/11/18  3D Planning Note   Prostate Brachytherapy Post-Implant Dosimetry  Diagnosis: 70 y.o. gentleman with Stage T1c adenocarcinoma of the prostate with Gleason score of 4+3, and PSA of 5.91.  Narrative: On a previous date, [name] returned following prostate seed implantation for post implant planning. He underwent CT scan complex simulation to delineate the three-dimensional structures of the pelvis and demonstrate the radiation distribution.  Since that time, the seed localization, and complex isodose planning with dose volume histograms have now been completed.  Results:   Prostate Coverage - The dose of radiation delivered to the 90% or more of the prostate gland (D90) was 107.66% of the prescription dose. This exceeds our goal of greater than 90%. Rectal Sparing - The volume of rectal tissue receiving the prescription dose or higher was 0.0 cc. This falls under our thresholds tolerance of 1.0 cc.  Impression: The prostate seed implant appears to show adequate target coverage and appropriate rectal sparing.  Plan:  The patient will continue to follow with urology for ongoing PSA determinations. I would anticipate a high likelihood for local tumor control with minimal risk for rectal morbidity.  ________________________________  Sheral Apley Tammi Klippel, M.D.

## 2020-10-02 DIAGNOSIS — C61 Malignant neoplasm of prostate: Secondary | ICD-10-CM | POA: Diagnosis not present

## 2020-10-09 DIAGNOSIS — Z8546 Personal history of malignant neoplasm of prostate: Secondary | ICD-10-CM | POA: Diagnosis not present

## 2020-10-09 DIAGNOSIS — Z08 Encounter for follow-up examination after completed treatment for malignant neoplasm: Secondary | ICD-10-CM | POA: Diagnosis not present

## 2020-10-09 DIAGNOSIS — C099 Malignant neoplasm of tonsil, unspecified: Secondary | ICD-10-CM | POA: Diagnosis not present

## 2020-10-09 DIAGNOSIS — Z85818 Personal history of malignant neoplasm of other sites of lip, oral cavity, and pharynx: Secondary | ICD-10-CM | POA: Diagnosis not present

## 2020-10-09 DIAGNOSIS — Z6825 Body mass index (BMI) 25.0-25.9, adult: Secondary | ICD-10-CM | POA: Diagnosis not present

## 2020-10-10 DIAGNOSIS — C61 Malignant neoplasm of prostate: Secondary | ICD-10-CM | POA: Diagnosis not present

## 2020-10-10 DIAGNOSIS — R3916 Straining to void: Secondary | ICD-10-CM | POA: Diagnosis not present

## 2020-10-10 DIAGNOSIS — R3915 Urgency of urination: Secondary | ICD-10-CM | POA: Diagnosis not present

## 2020-10-10 DIAGNOSIS — N401 Enlarged prostate with lower urinary tract symptoms: Secondary | ICD-10-CM | POA: Diagnosis not present

## 2020-10-10 DIAGNOSIS — R300 Dysuria: Secondary | ICD-10-CM | POA: Diagnosis not present

## 2020-10-10 DIAGNOSIS — R3912 Poor urinary stream: Secondary | ICD-10-CM | POA: Diagnosis not present

## 2020-10-15 DIAGNOSIS — E781 Pure hyperglyceridemia: Secondary | ICD-10-CM | POA: Diagnosis not present

## 2020-10-22 DIAGNOSIS — E782 Mixed hyperlipidemia: Secondary | ICD-10-CM | POA: Diagnosis not present

## 2020-10-22 DIAGNOSIS — Z122 Encounter for screening for malignant neoplasm of respiratory organs: Secondary | ICD-10-CM | POA: Diagnosis not present

## 2020-10-22 DIAGNOSIS — R7303 Prediabetes: Secondary | ICD-10-CM | POA: Diagnosis not present

## 2020-10-22 DIAGNOSIS — K7030 Alcoholic cirrhosis of liver without ascites: Secondary | ICD-10-CM | POA: Diagnosis not present

## 2020-10-22 DIAGNOSIS — F1911 Other psychoactive substance abuse, in remission: Secondary | ICD-10-CM | POA: Diagnosis not present

## 2020-10-22 DIAGNOSIS — I700 Atherosclerosis of aorta: Secondary | ICD-10-CM | POA: Diagnosis not present

## 2020-10-22 DIAGNOSIS — Z0000 Encounter for general adult medical examination without abnormal findings: Secondary | ICD-10-CM | POA: Diagnosis not present

## 2020-10-22 DIAGNOSIS — F321 Major depressive disorder, single episode, moderate: Secondary | ICD-10-CM | POA: Diagnosis not present

## 2020-10-22 DIAGNOSIS — I10 Essential (primary) hypertension: Secondary | ICD-10-CM | POA: Diagnosis not present

## 2020-10-22 DIAGNOSIS — C61 Malignant neoplasm of prostate: Secondary | ICD-10-CM | POA: Diagnosis not present

## 2020-10-23 DIAGNOSIS — Z23 Encounter for immunization: Secondary | ICD-10-CM | POA: Diagnosis not present

## 2020-10-29 DIAGNOSIS — C039 Malignant neoplasm of gum, unspecified: Secondary | ICD-10-CM | POA: Diagnosis not present

## 2020-10-29 DIAGNOSIS — Z9009 Acquired absence of other part of head and neck: Secondary | ICD-10-CM | POA: Diagnosis not present

## 2020-10-29 DIAGNOSIS — Z9221 Personal history of antineoplastic chemotherapy: Secondary | ICD-10-CM | POA: Diagnosis not present

## 2020-10-29 DIAGNOSIS — Z8589 Personal history of malignant neoplasm of other organs and systems: Secondary | ICD-10-CM | POA: Diagnosis not present

## 2020-10-29 DIAGNOSIS — Z85830 Personal history of malignant neoplasm of bone: Secondary | ICD-10-CM | POA: Diagnosis not present

## 2020-10-29 DIAGNOSIS — Z08 Encounter for follow-up examination after completed treatment for malignant neoplasm: Secondary | ICD-10-CM | POA: Diagnosis not present

## 2020-10-29 DIAGNOSIS — F17210 Nicotine dependence, cigarettes, uncomplicated: Secondary | ICD-10-CM | POA: Diagnosis not present

## 2020-11-04 DIAGNOSIS — S134XXA Sprain of ligaments of cervical spine, initial encounter: Secondary | ICD-10-CM | POA: Diagnosis not present

## 2020-11-04 DIAGNOSIS — M9901 Segmental and somatic dysfunction of cervical region: Secondary | ICD-10-CM | POA: Diagnosis not present

## 2020-11-04 DIAGNOSIS — S338XXA Sprain of other parts of lumbar spine and pelvis, initial encounter: Secondary | ICD-10-CM | POA: Diagnosis not present

## 2020-11-04 DIAGNOSIS — S233XXA Sprain of ligaments of thoracic spine, initial encounter: Secondary | ICD-10-CM | POA: Diagnosis not present

## 2020-11-04 DIAGNOSIS — M9902 Segmental and somatic dysfunction of thoracic region: Secondary | ICD-10-CM | POA: Diagnosis not present

## 2020-11-04 DIAGNOSIS — M9903 Segmental and somatic dysfunction of lumbar region: Secondary | ICD-10-CM | POA: Diagnosis not present

## 2020-11-12 DIAGNOSIS — C61 Malignant neoplasm of prostate: Secondary | ICD-10-CM | POA: Diagnosis not present

## 2020-11-18 DIAGNOSIS — M9903 Segmental and somatic dysfunction of lumbar region: Secondary | ICD-10-CM | POA: Diagnosis not present

## 2020-11-18 DIAGNOSIS — N401 Enlarged prostate with lower urinary tract symptoms: Secondary | ICD-10-CM | POA: Diagnosis not present

## 2020-11-18 DIAGNOSIS — S233XXA Sprain of ligaments of thoracic spine, initial encounter: Secondary | ICD-10-CM | POA: Diagnosis not present

## 2020-11-18 DIAGNOSIS — S338XXA Sprain of other parts of lumbar spine and pelvis, initial encounter: Secondary | ICD-10-CM | POA: Diagnosis not present

## 2020-11-18 DIAGNOSIS — M9901 Segmental and somatic dysfunction of cervical region: Secondary | ICD-10-CM | POA: Diagnosis not present

## 2020-11-18 DIAGNOSIS — C61 Malignant neoplasm of prostate: Secondary | ICD-10-CM | POA: Diagnosis not present

## 2020-11-18 DIAGNOSIS — R3915 Urgency of urination: Secondary | ICD-10-CM | POA: Diagnosis not present

## 2020-11-18 DIAGNOSIS — S134XXA Sprain of ligaments of cervical spine, initial encounter: Secondary | ICD-10-CM | POA: Diagnosis not present

## 2020-11-18 DIAGNOSIS — M9902 Segmental and somatic dysfunction of thoracic region: Secondary | ICD-10-CM | POA: Diagnosis not present

## 2020-12-03 DIAGNOSIS — M9903 Segmental and somatic dysfunction of lumbar region: Secondary | ICD-10-CM | POA: Diagnosis not present

## 2020-12-03 DIAGNOSIS — S233XXA Sprain of ligaments of thoracic spine, initial encounter: Secondary | ICD-10-CM | POA: Diagnosis not present

## 2020-12-03 DIAGNOSIS — M9901 Segmental and somatic dysfunction of cervical region: Secondary | ICD-10-CM | POA: Diagnosis not present

## 2020-12-03 DIAGNOSIS — S338XXA Sprain of other parts of lumbar spine and pelvis, initial encounter: Secondary | ICD-10-CM | POA: Diagnosis not present

## 2020-12-03 DIAGNOSIS — M9902 Segmental and somatic dysfunction of thoracic region: Secondary | ICD-10-CM | POA: Diagnosis not present

## 2020-12-03 DIAGNOSIS — S134XXA Sprain of ligaments of cervical spine, initial encounter: Secondary | ICD-10-CM | POA: Diagnosis not present

## 2020-12-18 DIAGNOSIS — S134XXA Sprain of ligaments of cervical spine, initial encounter: Secondary | ICD-10-CM | POA: Diagnosis not present

## 2020-12-18 DIAGNOSIS — S233XXA Sprain of ligaments of thoracic spine, initial encounter: Secondary | ICD-10-CM | POA: Diagnosis not present

## 2020-12-18 DIAGNOSIS — M9901 Segmental and somatic dysfunction of cervical region: Secondary | ICD-10-CM | POA: Diagnosis not present

## 2020-12-18 DIAGNOSIS — S338XXA Sprain of other parts of lumbar spine and pelvis, initial encounter: Secondary | ICD-10-CM | POA: Diagnosis not present

## 2020-12-18 DIAGNOSIS — M9903 Segmental and somatic dysfunction of lumbar region: Secondary | ICD-10-CM | POA: Diagnosis not present

## 2020-12-18 DIAGNOSIS — M9902 Segmental and somatic dysfunction of thoracic region: Secondary | ICD-10-CM | POA: Diagnosis not present

## 2020-12-24 ENCOUNTER — Other Ambulatory Visit: Payer: Self-pay

## 2020-12-24 ENCOUNTER — Ambulatory Visit (HOSPITAL_COMMUNITY)
Admission: RE | Admit: 2020-12-24 | Discharge: 2020-12-24 | Disposition: A | Discharge status: Home / Self Care | Payer: Medicare Other | Source: Ambulatory Visit | Attending: Hematology | Admitting: Hematology

## 2020-12-24 ENCOUNTER — Inpatient Hospital Stay (HOSPITAL_COMMUNITY): Payer: Medicare Other | Attending: Hematology

## 2020-12-24 DIAGNOSIS — K110 Atrophy of salivary gland: Secondary | ICD-10-CM | POA: Diagnosis not present

## 2020-12-24 DIAGNOSIS — C099 Malignant neoplasm of tonsil, unspecified: Secondary | ICD-10-CM | POA: Diagnosis not present

## 2020-12-24 DIAGNOSIS — Z7982 Long term (current) use of aspirin: Secondary | ICD-10-CM | POA: Insufficient documentation

## 2020-12-24 DIAGNOSIS — Z122 Encounter for screening for malignant neoplasm of respiratory organs: Secondary | ICD-10-CM | POA: Insufficient documentation

## 2020-12-24 DIAGNOSIS — J387 Other diseases of larynx: Secondary | ICD-10-CM | POA: Diagnosis not present

## 2020-12-24 DIAGNOSIS — A630 Anogenital (venereal) warts: Secondary | ICD-10-CM | POA: Insufficient documentation

## 2020-12-24 DIAGNOSIS — J359 Chronic disease of tonsils and adenoids, unspecified: Secondary | ICD-10-CM

## 2020-12-24 DIAGNOSIS — K219 Gastro-esophageal reflux disease without esophagitis: Secondary | ICD-10-CM | POA: Insufficient documentation

## 2020-12-24 DIAGNOSIS — E782 Mixed hyperlipidemia: Secondary | ICD-10-CM | POA: Insufficient documentation

## 2020-12-24 DIAGNOSIS — K766 Portal hypertension: Secondary | ICD-10-CM | POA: Insufficient documentation

## 2020-12-24 DIAGNOSIS — K7460 Unspecified cirrhosis of liver: Secondary | ICD-10-CM | POA: Insufficient documentation

## 2020-12-24 DIAGNOSIS — F17210 Nicotine dependence, cigarettes, uncomplicated: Secondary | ICD-10-CM | POA: Diagnosis not present

## 2020-12-24 DIAGNOSIS — M2550 Pain in unspecified joint: Secondary | ICD-10-CM | POA: Insufficient documentation

## 2020-12-24 DIAGNOSIS — Z79899 Other long term (current) drug therapy: Secondary | ICD-10-CM | POA: Insufficient documentation

## 2020-12-24 DIAGNOSIS — Z87891 Personal history of nicotine dependence: Secondary | ICD-10-CM | POA: Insufficient documentation

## 2020-12-24 DIAGNOSIS — I10 Essential (primary) hypertension: Secondary | ICD-10-CM | POA: Insufficient documentation

## 2020-12-24 DIAGNOSIS — C61 Malignant neoplasm of prostate: Secondary | ICD-10-CM | POA: Insufficient documentation

## 2020-12-24 DIAGNOSIS — Z85820 Personal history of malignant melanoma of skin: Secondary | ICD-10-CM | POA: Insufficient documentation

## 2020-12-24 DIAGNOSIS — Z923 Personal history of irradiation: Secondary | ICD-10-CM | POA: Insufficient documentation

## 2020-12-24 DIAGNOSIS — Z9221 Personal history of antineoplastic chemotherapy: Secondary | ICD-10-CM | POA: Insufficient documentation

## 2020-12-24 DIAGNOSIS — R5383 Other fatigue: Secondary | ICD-10-CM | POA: Insufficient documentation

## 2020-12-24 DIAGNOSIS — J392 Other diseases of pharynx: Secondary | ICD-10-CM | POA: Diagnosis not present

## 2020-12-24 DIAGNOSIS — Z8589 Personal history of malignant neoplasm of other organs and systems: Secondary | ICD-10-CM | POA: Insufficient documentation

## 2020-12-24 LAB — TSH: TSH: 5.238 u[IU]/mL — ABNORMAL HIGH (ref 0.350–4.500)

## 2020-12-24 LAB — COMPREHENSIVE METABOLIC PANEL
ALT: 19 U/L (ref 0–44)
AST: 17 U/L (ref 15–41)
Albumin: 4.1 g/dL (ref 3.5–5.0)
Alkaline Phosphatase: 53 U/L (ref 38–126)
Anion gap: 6 (ref 5–15)
BUN: 14 mg/dL (ref 8–23)
CO2: 28 mmol/L (ref 22–32)
Calcium: 9.3 mg/dL (ref 8.9–10.3)
Chloride: 103 mmol/L (ref 98–111)
Creatinine, Ser: 0.79 mg/dL (ref 0.61–1.24)
GFR, Estimated: >60 mL/min (ref >60)
Glucose, Bld: 136 mg/dL — ABNORMAL HIGH (ref 70–99)
Potassium: 4.5 mmol/L (ref 3.5–5.1)
Sodium: 137 mmol/L (ref 135–145)
Total Bilirubin: 0.7 mg/dL (ref 0.3–1.2)
Total Protein: 6.9 g/dL (ref 6.5–8.1)

## 2020-12-24 LAB — CBC WITH DIFFERENTIAL/PLATELET
Abs Immature Granulocytes: 0.04 10*3/uL (ref 0.00–0.07)
Basophils Absolute: 0.1 10*3/uL (ref 0.0–0.1)
Basophils Relative: 2 %
Eosinophils Absolute: 0.1 10*3/uL (ref 0.0–0.5)
Eosinophils Relative: 2 %
HCT: 42.2 % (ref 39.0–52.0)
Hemoglobin: 12.9 g/dL — ABNORMAL LOW (ref 13.0–17.0)
Immature Granulocytes: 1 %
Lymphocytes Relative: 13 %
Lymphs Abs: 0.7 10*3/uL (ref 0.7–4.0)
MCH: 26.5 pg (ref 26.0–34.0)
MCHC: 30.6 g/dL (ref 30.0–36.0)
MCV: 86.7 fL (ref 80.0–100.0)
Monocytes Absolute: 0.5 10*3/uL (ref 0.1–1.0)
Monocytes Relative: 9 %
Neutro Abs: 4.2 10*3/uL (ref 1.7–7.7)
Neutrophils Relative %: 73 %
Platelets: 279 10*3/uL (ref 150–400)
RBC: 4.87 MIL/uL (ref 4.22–5.81)
RDW: 16.4 % — ABNORMAL HIGH (ref 11.5–15.5)
WBC: 5.8 10*3/uL (ref 4.0–10.5)
nRBC: 0 % (ref 0.0–0.2)

## 2020-12-24 MED ORDER — IOHEXOL 300 MG/ML  SOLN
50.0000 mL | Freq: Once | INTRAMUSCULAR | Status: AC | PRN
  Administered 2020-12-24: 50 mL via INTRAVENOUS

## 2020-12-30 NOTE — Progress Notes (Signed)
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 Left tonsillectomy on 07/09/2015 2. Chemoradiation with cisplatin x 1 cycle and carboplatin, 5-FU x 2 cycles from 09/11/2015 to 09/30/2015 and 70 Gy.  NGS Results: Not done  CURRENT THERAPY: Observation  BRIEF ONCOLOGIC HISTORY:  Oncology History  Tonsil cancer ([place])  08/02/2015 Initial Diagnosis   Cancer of tonsillar fossa ([place])    08/20/2015 Procedure   Port-a-cath placed by IR    08/20/2015 Procedure   G-tube placed by IR    08/21/2015 - 10/09/2015 Radiation Therapy   70 Gy, Dr. Isidore Moos.    08/21/2015 - 09/10/2015 Chemotherapy   Cisplatin every 21 days with XRT.    08/28/2015 Adverse Reaction   Tinnitis, Cisplatin-induced.    09/11/2015 - 09/30/2015 Chemotherapy   Carboplatin/5FU    09/20/2015 - 09/21/2015 Hospital Admission   Intractable nausea/vomiting. Severe Mucositis    10/17/2015 - 10/20/2015 Hospital Admission   Esophageal/oral candidasis. Shingles. Fever. Chemotherapy induced pancytopenia    01/24/2016 PET scan   Resolution of the left tonsillar mass and left neck adenopathy. No findings for residual or locally recurrent tumor, adenopathy or distant metastatic disease.    03/28/2016 - 04/08/2016 Hospital Admission   Admitted with severe abdominal pain. Found to diffuse peritonitis from perforated sigmoid colon requiring colostomy. Discharged to [facility] in [place].          CANCER STAGING: Cancer Staging Malignant neoplasm of prostate [facility]) Staging form: Prostate, AJCC 8th Edition - Clinical stage from 02/08/2020: Stage IIC (cT1c, cN0, cM0, PSA: 5.9, Grade Group: 3) - Unsigned  Tonsil cancer ([place]) Staging form: Pharynx - Oropharynx, AJCC 7th Edition - Clinical: Stage  IVA (T2, N2b, M0) - Signed by Eppie Gibson, MD on 08/02/2015   INTERVAL HISTORY:  Mr. Jonnatan Appleby, a 70 y.o. male, returns for routine follow-up of his tonsillar cancer HPV positive. Deddrick was last seen on 06/27/2020.   On exam today Mr. Dyar notes he has been well in the interim since his last visit.  He notes that his appetite is good and he is able to eat without difficulty.  He does have some soreness in his jaw that makes it difficult for him to eat chewy foods like red meat and chicken.  His energy levels are good and his weight is currently stable compared to 6 months ago.  He notes he has been undergoing direct visualization of the back of his throat with ENT.  He has been having some difficulty with the radiation treatments to his bladder and notes that he has been having some overactive issues.  He has been taking oxybutynin but has been causing constipation.  He otherwise denies any fevers, chills, sweats, nausea, vomiting.  A full 10 point ROS is listed below.   REVIEW OF SYSTEMS:  Review of Systems  Constitutional:  Positive for fatigue (90%). Negative for appetite change.  HENT:   Negative for trouble swallowing.   Musculoskeletal:  Positive for arthralgias (4/10 L shoulder pain).  All other systems reviewed and are negative.  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 Benjamine Mola, 06/2015   Iron deficiency anemia 05/07/2016   Melanoma ([place]) 1994   right calf   Mixed hyperlipidemia    Portal hypertension ([place])    Prostate cancer ([place])    Tonsillar cancer ([place]) 06/2015   SCC chemo  and radiation done   Wears contact lenses    in left eye   Past Surgical History:  Procedure Laterality Date   BIOPSY  07/02/2016   Procedure: BIOPSY;  Surgeon: Daneil Dolin, MD;  [facility];  Service: Endoscopy;;  esophageal   COLON SURGERY  2017   ruptured colon   COLONOSCOPY WITH PROPOFOL N/A 07/02/2016   Procedure: COLONOSCOPY WITH PROPOFOL;  Surgeon: Daneil Dolin, MD;  [facility];  Service: Endoscopy;  Laterality: N/A;  9:45am   COLOSTOMY TAKEDOWN N/A 09/02/2016   Procedure: LAPAROSCOPIC COLOSTOMY REVERSAL;  Surgeon: Leighton Ruff, MD;  [facility];  Service: General;  Laterality: N/A;   ESOPHAGOGASTRODUODENOSCOPY (EGD) WITH PROPOFOL N/A 07/02/2016   Procedure: ESOPHAGOGASTRODUODENOSCOPY (EGD) WITH PROPOFOL;  Surgeon: Daneil Dolin, MD;  [facility];  Service: Endoscopy;  Laterality: N/A;   ESOPHAGOGASTRODUODENOSCOPY (EGD) WITH PROPOFOL N/A 12/28/2019   Procedure: ESOPHAGOGASTRODUODENOSCOPY (EGD) WITH PROPOFOL;  Surgeon: Daneil Dolin, MD;  [facility];  Service: Endoscopy;  Laterality: N/A;  11:00am   HERNIA REPAIR Right 2019   [facility]ie P[facility] with [facility]  09/02/2016   Procedure: HERNIA REPAIR INCISIONAL;  Surgeon: Leighton Ruff, MD;  [facility];  Service: General;;   LAPAROTOMY N/A 03/28/2016   Procedure: EXPLORATORY LAPAROTOMY, SIGMOID COLECTOMY, COLOSTOMY;  Surgeon: Leighton Ruff, MD;  [facility];  Service: General;  Laterality: N/A;   MALONEY DILATION N/A 07/02/2016   Procedure: Keturah Shavers;  Surgeon: Daneil Dolin, MD;  [facility];  Service: Endoscopy;  Laterality: N/A;   MOUTH SURGERY  2019   left lower jaw   PORT-A-CATH REMOVAL  09/02/2016   Procedure: REMOVAL PORT-A-CATH;  Surgeon: Leighton Ruff, MD;  [facility];  Service: General;;   PROSTATE BIOPSY  2021   RADIOACTIVE SEED IMPLANT N/A 06/28/2020   Procedure: RADIOACTIVE SEED IMPLANT/BRACHYTHERAPY IMPLANT  WITH CYSTOSCOPY;  Surgeon: Janith Lima, MD;  Location: [facility];  Service: Urology;  Laterality: N/A;   REMOVAL OF GASTROSTOMY TUBE N/A 09/02/2016   Procedure: REMOVAL OF GASTROSTOMY TUBE;  Surgeon: Leighton Ruff, MD;  [facility];  Service: General;  Laterality: N/A;   SPACE OAR INSTILLATION N/A 06/28/2020   Procedure: SPACE OAR INSTILLATION;  Surgeon: Janith Lima, MD;  Location: [facility];  Service: Urology;  Laterality: N/A;   TONSILLECTOMY Left 07/09/2015   Procedure: LEFT TONSILLECTOMY;  Surgeon: Leta Baptist, MD;  Location: [place];  Service: ENT;  Laterality: Left;   TONSILLECTOMY  2017   left tonsil   Z-PLASTY SCAR REVISION N/A 09/02/2016   Procedure: SCAR REVISION;  Surgeon: Leighton Ruff, MD;  [facility];  Service: General;  Laterality: N/A;    SOCIAL HISTORY:  Social History   Socioeconomic History   Marital status: Single    Spouse name: Not on file  Number of children: 0   Years of education: Not on file   Highest education level: Not on file  Occupational History   Not on file  Tobacco Use   Smoking status: Some Days    Packs/day: 0.50    Years: 30.00    Pack years: 15.00    Types: Cigarettes    Last attempt to quit: 11/22/2016    Years since quitting: 4.1   Smokeless tobacco: Never   Tobacco comments:    smoke social occ  Vaping Use   Vaping Use: Never used  Substance and Sexual Activity   Alcohol use: No    Alcohol/week: 0.0 standard drinks    Comment: former abuse- quit 09/2011   Drug use: No   Sexual activity: Not Currently  Other Topics Concern   Not on file  Social History Narrative   Divorced.   No children.   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    Has been smoking off/on since age 89, quit smoking 11/2016.    Exercises minimally   Eats all food groups.   Wears seatbelt.   Mother lives with  him, has dementia.   Attends AA meetings.    Social Determinants of Health   Financial Resource Strain: Not on file  Food Insecurity: Not on file  Transportation Needs: Not on file  Physical Activity: Not on file  Stress: Not on file  Social Connections: Not on file  Intimate Partner Violence: Not At Risk   Fear of Current or Ex-Partner: No   Emotionally Abused: No   Physically Abused: No   Sexually Abused: No    FAMILY HISTORY:  Family History  Problem Relation Age of Onset   Macular degeneration Mother    Dementia Mother    Alcoholism Father    Prostate cancer Father    Diabetes Maternal Aunt    Diabetes Maternal Uncle    Prostate cancer Maternal Uncle    Mental illness Sister    Pancreatic cancer Neg Hx    Breast cancer Neg Hx    Colon cancer Neg Hx     CURRENT MEDICATIONS:  Current Outpatient Medications  Medication Sig Dispense Refill   aspirin EC 81 MG tablet Take 1 tablet (81 mg total) by mouth daily.     atorvastatin (LIPITOR) 40 MG tablet Take 1 tablet (40 mg total) by mouth daily. (Patient taking differently: Take 40 mg by mouth at bedtime.) 90 tablet 0   cetirizine (ZYRTEC) 10 MG tablet Take 10 mg by mouth daily as needed for allergies.      CHANTIX STARTING MONTH PAK 0.5 MG X 11 & 1 MG X 42 tablet Take 1 mg by mouth 2 (two) times daily.     chlorhexidine (PERIDEX) 0.12 % solution 15 mLs as needed.     DEXILANT 60 MG capsule Take 1 capsule (60 mg total) by mouth daily. 30 capsule 11   docusate sodium (COLACE) 100 MG capsule Take 1 capsule (100 mg total) by mouth daily as needed for up to 30 doses. 30 capsule 0   DULoxetine (CYMBALTA) 60 MG capsule Take 120 mg by mouth daily.     fluticasone (FLONASE) 50 MCG/ACT nasal spray Place 2 sprays into both nostrils daily.     lisinopril (PRINIVIL,ZESTRIL) 10 MG tablet Take 1 tablet (10 mg total) by mouth daily. 90 tablet 3   Melatonin 10 MG  TABS Take 1 tablet by mouth as needed.      MULTIPLE VITAMINS-MINERALS ER PO  Take 1 tablet by mouth daily.      oxybutynin (DITROPAN-XL) 5 MG 24 hr tablet Take 5 mg by mouth daily.     silodosin (RAPAFLO) 8 MG CAPS capsule Take 8 mg by mouth every evening.     sodium fluoride (FLUORISHIELD) 1.1 % GEL dental gel Instill one drop of gel per tooth space of fluoride tray. Place over teeth for 5 minutes. Remove. Spit out excess. Repeat nightly. 125 mL 11   tamsulosin (FLOMAX) 0.4 MG CAPS capsule Take 0.8 mg by mouth at bedtime.     triamcinolone (KENALOG) 0.1 % paste SMARTSIG:TO TEETH 4 Times Daily     triamcinolone cream (KENALOG) 0.1 % Apply 1 application topically daily.  2   Trospium Chloride 60 MG CP24 Take 1 capsule by mouth daily.     oxyCODONE-acetaminophen (PERCOCET) 5-325 MG tablet Take 1 tablet by mouth every 4 (four) hours as needed for up to 18 doses for severe pain. (Patient not taking: Reported on 12/31/2020) 18 tablet 0   No current facility-administered medications for this visit.   Facility-Administered Medications Ordered in Other Visits  Medication Dose Route Frequency Provider Last Rate Last Admin   0.9 %  sodium chloride infusion   Intravenous Continuous Kefalas, Manon Hilding, PA-C   New Bag at 03/28/16 1754   0.9 %  sodium chloride infusion   Intravenous Continuous Kefalas, Marcello Moores S, PA-C       0.9 %  sodium chloride infusion   Intravenous Continuous Penland, Larene Beach K, MD       0.9 %  sodium chloride infusion   Intravenous Continuous Penland, Kelby Fam, MD       0.9 %  sodium chloride infusion   Intravenous Continuous Penland, Kelby Fam, MD        ALLERGIES:  Allergies  Allergen Reactions   Bee Venom Swelling and Anaphylaxis   Other Other (See Comments)    Opthalmic mycin irritates eyes.     Tobramycin Ophthalmic [Tobramycin]     Other reaction(s): DRYNESS, IRRITATION    PHYSICAL EXAM:  Performance status (ECOG): 1 - Symptomatic but completely ambulatory  Vitals:   12/31/20 0828  BP: 136/71  Pulse: 99  Resp: 20  Temp: (!) 96.8 F (36 C)   SpO2: 100%   Wt Readings from Last 3 Encounters:  12/31/20 176 lb 8 oz (80.1 kg)  07/25/20 172 lb 4 oz (78.1 kg)  07/17/20 174 lb (78.9 kg)   Physical Exam Vitals reviewed.  Constitutional:      Appearance: Normal appearance.  HENT:     Mouth/Throat:     Lips: No lesions.     Mouth: No oral lesions.     Dentition: Abnormal dentition (missing teeth on bottom L jaw). No gum lesions.     Tongue: No lesions.     Palate: No mass.     Pharynx: No posterior oropharyngeal erythema.     Tonsils: No tonsillar exudate.  Cardiovascular:     Rate and Rhythm: Normal rate and regular rhythm.     Pulses: Normal pulses.     Heart sounds: Normal heart sounds.  Pulmonary:     Effort: Pulmonary effort is normal.     Breath sounds: Normal breath sounds.  Chest:  Breasts:    Right: No supraclavicular adenopathy.     Left: No supraclavicular adenopathy.  Lymphadenopathy:     Cervical: No cervical  adenopathy.     Upper Body:     Right upper body: No supraclavicular adenopathy.     Left upper body: No supraclavicular adenopathy.  Neurological:     General: No focal deficit present.     Mental Status: He is alert and oriented to person, place, and time.  Psychiatric:        Mood and Affect: Mood normal.        Behavior: Behavior normal.     LABORATORY DATA:  I have reviewed the labs as listed.  CBC Latest Ref Rng & Units 12/24/2020 06/20/2020 12/12/2019  WBC 4.0 - 10.5 K/uL 5.8 7.6 7.0  Hemoglobin 13.0 - 17.0 g/dL 12.9(L) 14.1 13.3  Hematocrit 39.0 - 52.0 % 42.2 46.0 41.4  Platelets 150 - 400 K/uL 279 297 293   CMP Latest Ref Rng & Units 12/24/2020 06/20/2020 12/12/2019  Glucose 70 - 99 mg/dL 136(H) 123(H) 127(H)  BUN 8 - 23 mg/dL '14 17 12  '$ Creatinine 0.61 - 1.24 mg/dL 0.79 0.87 0.81  Sodium 135 - 145 mmol/L 137 136 137  Potassium 3.5 - 5.1 mmol/L 4.5 4.4 4.4  Chloride 98 - 111 mmol/L 103 101 101  CO2 22 - 32 mmol/L '28 27 24  '$ Calcium 8.9 - 10.3 mg/dL 9.3 9.4 9.1  Total Protein 6.5 - 8.1  g/dL 6.9 7.1 6.8  Total Bilirubin 0.3 - 1.2 mg/dL 0.7 0.7 0.6  Alkaline Phos 38 - 126 U/L 53 47 48  AST 15 - 41 U/L '17 18 17  '$ ALT 0 - 44 U/L '19 16 20   '$ Lab Results  Component Value Date   VD25OH 34.41 06/20/2020   VD25OH 20.71 (L) 12/12/2019   VD25OH 49 03/19/2017    DIAGNOSTIC IMAGING:  I have independently reviewed the scans and discussed with the patient. CT SOFT TISSUE NECK W CONTRAST  Result Date: 12/25/2020 CLINICAL DATA:  Tonsil/adenoid disorder tonsil cancer EXAM: CT NECK WITH CONTRAST TECHNIQUE: Multidetector CT imaging of the neck was performed using the standard protocol following the bolus administration of intravenous contrast. CONTRAST:  12m OMNIPAQUE IOHEXOL 300 MG/ML  SOLN COMPARISON:  CT of the neck 06/20/2020. FINDINGS: Pharynx and larynx: Soft tissue thickening primarily at the level of the oropharynx and supraglottic larynx, most likely reflecting post treatment change and similar to prior. Streak artifact from dental amalgam limits evaluation of the oral cavity/pharynx. Postsurgical changes of prior left mandibulectomy. Vocal cords are opposed. Salivary glands: Post radiation change with fatty atrophy, similar to prior. Otherwise, unremarkable. Thyroid: Normal. Lymph nodes: None enlarged or abnormal density. Vascular: Limited evaluation due to non arterial timing. Calcific atherosclerosis. Limited intracranial: No obvious acute abnormality in the visualized brain on limited assessment. Visualized orbits: Negative. Mastoids and visualized paranasal sinuses: Clear. Skeleton: Postsurgical changes of prior left mandibulectomy. Multilevel degenerative disc disease, greatest at C5-C6 and C6-C7 where there is disc height loss, endplate sclerosis and endplate spurring. Upper chest: Mild biapical pleuroparenchymal scarring. Mild dependent ground-glass opacities, most likely atelectasis. Otherwise, visualized lung apices are clear. IMPRESSION: Similar post treatment change without  evidence of recurrence disease. No new adenopathy. Electronically Signed   By: FMargaretha SheffieldMD   On: 12/25/2020 10:42   CT CHEST LUNG CA SCREEN LOW DOSE W/O CM  Result Date: 12/25/2020 CLINICAL DATA:  70year old male current smoker with 50 pack-year history of smoking. Lung cancer screening examination. EXAM: CT CHEST WITHOUT CONTRAST LOW-DOSE FOR LUNG CANCER SCREENING TECHNIQUE: Multidetector CT imaging of the chest was performed following the standard protocol  without IV contrast. COMPARISON:  Low-dose lung cancer screening chest CT 12/08/2019. FINDINGS: Cardiovascular: Heart size is normal. There is no significant pericardial fluid, thickening or pericardial calcification. There is aortic atherosclerosis, as well as atherosclerosis of the great vessels of the mediastinum and the coronary arteries, including calcified atherosclerotic plaque in the left main, left anterior descending, left circumflex and right coronary arteries. Mediastinum/Nodes: No pathologically enlarged mediastinal or hilar lymph nodes. Please note that accurate exclusion of hilar adenopathy is limited on noncontrast CT scans. Esophagus is unremarkable in appearance. No axillary lymphadenopathy. Lungs/Pleura: Tiny calcified granuloma in the right lower lobe. No other suspicious appearing pulmonary nodules or masses are noted. No acute consolidative airspace disease. No pleural effusions. Diffuse bronchial wall thickening with mild centrilobular and paraseptal emphysema. Upper Abdomen: Aortic atherosclerosis. Musculoskeletal: There are no aggressive appearing lytic or blastic lesions noted in the visualized portions of the skeleton. IMPRESSION: 1. Lung-RADS 1S, negative. Continue annual screening with low-dose chest CT without contrast in 12 months. 2. The "S" modifier above refers to potentially clinically significant non lung cancer related findings. Specifically, there is aortic atherosclerosis, in addition to left main and 3 vessel  coronary artery disease. Please note that although the presence of coronary artery calcium documents the presence of coronary artery disease, the severity of this disease and any potential stenosis cannot be assessed on this non-gated CT examination. Assessment for potential risk factor modification, dietary therapy or pharmacologic therapy may be warranted, if clinically indicated. 3. Mild diffuse bronchial wall thickening with mild centrilobular and paraseptal emphysema; imaging findings suggestive of underlying COPD. Aortic Atherosclerosis (ICD10-I70.0) and Emphysema (ICD10-J43.9). Electronically Signed   By: Vinnie Langton M.D.   On: 12/25/2020 09:34      ASSESSMENT:  1.  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 -Today's physical exam did not reveal any palpable lymphadenopathy in the neck region.  No masses in the oral cavity. -Patient denies any dysphagia. -Labs done on 06/06/2019 showed potassium 4.4, creatinine 0.79, LFTs WNL, WBC 7.6, hemoglobin 13.3, platelets 317. -We will see him back in 6 months for follow-up with labs.   2.  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 Benjamine Mola, referred to Dr. Nicolette Bang who did marginal mandibulectomy with primary closure on 09/20/2017. -This showed 2.1 x 0.9 x 0.4 cm squamous cell carcinoma with mandibular bone invasion. -Today's examination in the oral cavity did not reveal any suspicious masses. -His PET CT scan on 08/24/2017 was negative for any metastatic adenopathy. -He continues to see Dr. Nicolette Bang every 4 months.  He sees Dr. Benjamine Mola every 6 months. -Patient reports he has a visit with his dentist today on 06/13/2019 for a regular oral check and teeth cleaning. -We  will see him back in 6 months of repeat blood work and follow-up.   PLAN:  1.  Stage IV left tonsillar cancer: -Physical examination did not reveal any palpable masses or tonsillar masses.  No adenopathy. -Reviewed CT of the neck from 12/24/2020 which did not show any evidence of recurrence. -TSH is 5.238.  Rest of the labs were grossly within normal limits. -Continue follow-ups with Dr. Benjamine Mola.   2.  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 -Reviewed CT scan which did not show any evidence of recurrence or adenopathy. -RTC follow-up in 6 months with CT of the neck.   3.  Smoking history: -He will have another low-dose CT scan of the lungs in 1 year. Due in 12/2021.   4.  Prostate cancer: -Underwent seed implants with Dr. Tammi Klippel at [facility] placed this encounter:  Orders Placed This Encounter  Procedures   CT Soft Tissue Neck W Contrast    Ledell Peoples, MD Department of Hematology/Oncology [place] [facility] Phone: [phone] Pager: [phone] Email: [email or web address]

## 2020-12-31 ENCOUNTER — Encounter (HOSPITAL_COMMUNITY): Payer: Self-pay | Admitting: Hematology and Oncology

## 2020-12-31 ENCOUNTER — Inpatient Hospital Stay (HOSPITAL_BASED_OUTPATIENT_CLINIC_OR_DEPARTMENT_OTHER): Payer: Medicare Other | Admitting: Hematology and Oncology

## 2020-12-31 ENCOUNTER — Other Ambulatory Visit: Payer: Self-pay

## 2020-12-31 VITALS — BP 136/71 | HR 99 | Temp 96.8°F | Resp 20 | Wt 176.5 lb

## 2020-12-31 DIAGNOSIS — C099 Malignant neoplasm of tonsil, unspecified: Secondary | ICD-10-CM

## 2020-12-31 DIAGNOSIS — A630 Anogenital (venereal) warts: Secondary | ICD-10-CM | POA: Diagnosis not present

## 2020-12-31 DIAGNOSIS — Z87891 Personal history of nicotine dependence: Secondary | ICD-10-CM

## 2020-12-31 DIAGNOSIS — Z79899 Other long term (current) drug therapy: Secondary | ICD-10-CM | POA: Diagnosis not present

## 2020-12-31 DIAGNOSIS — J359 Chronic disease of tonsils and adenoids, unspecified: Secondary | ICD-10-CM | POA: Diagnosis not present

## 2020-12-31 DIAGNOSIS — Z8589 Personal history of malignant neoplasm of other organs and systems: Secondary | ICD-10-CM | POA: Diagnosis not present

## 2020-12-31 DIAGNOSIS — C61 Malignant neoplasm of prostate: Secondary | ICD-10-CM | POA: Diagnosis not present

## 2020-12-31 DIAGNOSIS — F17210 Nicotine dependence, cigarettes, uncomplicated: Secondary | ICD-10-CM | POA: Diagnosis not present

## 2020-12-31 DIAGNOSIS — Z9221 Personal history of antineoplastic chemotherapy: Secondary | ICD-10-CM | POA: Diagnosis not present

## 2020-12-31 DIAGNOSIS — K7460 Unspecified cirrhosis of liver: Secondary | ICD-10-CM | POA: Diagnosis not present

## 2020-12-31 DIAGNOSIS — R5383 Other fatigue: Secondary | ICD-10-CM | POA: Diagnosis not present

## 2020-12-31 DIAGNOSIS — K766 Portal hypertension: Secondary | ICD-10-CM | POA: Diagnosis not present

## 2020-12-31 DIAGNOSIS — M2550 Pain in unspecified joint: Secondary | ICD-10-CM | POA: Diagnosis not present

## 2020-12-31 DIAGNOSIS — Z923 Personal history of irradiation: Secondary | ICD-10-CM | POA: Diagnosis not present

## 2020-12-31 DIAGNOSIS — K219 Gastro-esophageal reflux disease without esophagitis: Secondary | ICD-10-CM | POA: Diagnosis not present

## 2020-12-31 DIAGNOSIS — Z85820 Personal history of malignant melanoma of skin: Secondary | ICD-10-CM | POA: Diagnosis not present

## 2020-12-31 DIAGNOSIS — I10 Essential (primary) hypertension: Secondary | ICD-10-CM | POA: Diagnosis not present

## 2020-12-31 DIAGNOSIS — Z7982 Long term (current) use of aspirin: Secondary | ICD-10-CM | POA: Diagnosis not present

## 2020-12-31 DIAGNOSIS — E782 Mixed hyperlipidemia: Secondary | ICD-10-CM | POA: Diagnosis not present

## 2021-01-01 DIAGNOSIS — M9903 Segmental and somatic dysfunction of lumbar region: Secondary | ICD-10-CM | POA: Diagnosis not present

## 2021-01-01 DIAGNOSIS — M9902 Segmental and somatic dysfunction of thoracic region: Secondary | ICD-10-CM | POA: Diagnosis not present

## 2021-01-01 DIAGNOSIS — M9901 Segmental and somatic dysfunction of cervical region: Secondary | ICD-10-CM | POA: Diagnosis not present

## 2021-01-01 DIAGNOSIS — S134XXA Sprain of ligaments of cervical spine, initial encounter: Secondary | ICD-10-CM | POA: Diagnosis not present

## 2021-01-01 DIAGNOSIS — S338XXA Sprain of other parts of lumbar spine and pelvis, initial encounter: Secondary | ICD-10-CM | POA: Diagnosis not present

## 2021-01-01 DIAGNOSIS — S233XXA Sprain of ligaments of thoracic spine, initial encounter: Secondary | ICD-10-CM | POA: Diagnosis not present

## 2021-01-02 ENCOUNTER — Other Ambulatory Visit (HOSPITAL_COMMUNITY): Payer: Self-pay

## 2021-01-02 DIAGNOSIS — Z87891 Personal history of nicotine dependence: Secondary | ICD-10-CM

## 2021-01-02 DIAGNOSIS — Z122 Encounter for screening for malignant neoplasm of respiratory organs: Secondary | ICD-10-CM

## 2021-01-02 DIAGNOSIS — C099 Malignant neoplasm of tonsil, unspecified: Secondary | ICD-10-CM

## 2021-01-02 DIAGNOSIS — E43 Unspecified severe protein-calorie malnutrition: Secondary | ICD-10-CM

## 2021-01-21 DIAGNOSIS — Z85828 Personal history of other malignant neoplasm of skin: Secondary | ICD-10-CM | POA: Diagnosis not present

## 2021-01-21 DIAGNOSIS — Z85820 Personal history of malignant melanoma of skin: Secondary | ICD-10-CM | POA: Diagnosis not present

## 2021-01-21 DIAGNOSIS — L309 Dermatitis, unspecified: Secondary | ICD-10-CM | POA: Diagnosis not present

## 2021-01-21 DIAGNOSIS — Z1283 Encounter for screening for malignant neoplasm of skin: Secondary | ICD-10-CM | POA: Diagnosis not present

## 2021-01-22 DIAGNOSIS — S338XXA Sprain of other parts of lumbar spine and pelvis, initial encounter: Secondary | ICD-10-CM | POA: Diagnosis not present

## 2021-01-22 DIAGNOSIS — S233XXA Sprain of ligaments of thoracic spine, initial encounter: Secondary | ICD-10-CM | POA: Diagnosis not present

## 2021-01-22 DIAGNOSIS — S134XXA Sprain of ligaments of cervical spine, initial encounter: Secondary | ICD-10-CM | POA: Diagnosis not present

## 2021-01-22 DIAGNOSIS — M9903 Segmental and somatic dysfunction of lumbar region: Secondary | ICD-10-CM | POA: Diagnosis not present

## 2021-01-22 DIAGNOSIS — M9901 Segmental and somatic dysfunction of cervical region: Secondary | ICD-10-CM | POA: Diagnosis not present

## 2021-01-22 DIAGNOSIS — M9902 Segmental and somatic dysfunction of thoracic region: Secondary | ICD-10-CM | POA: Diagnosis not present

## 2021-02-27 DIAGNOSIS — H6123 Impacted cerumen, bilateral: Secondary | ICD-10-CM | POA: Diagnosis not present

## 2021-02-27 DIAGNOSIS — Z85819 Personal history of malignant neoplasm of unspecified site of lip, oral cavity, and pharynx: Secondary | ICD-10-CM | POA: Diagnosis not present

## 2021-03-24 DIAGNOSIS — Z23 Encounter for immunization: Secondary | ICD-10-CM | POA: Diagnosis not present

## 2021-04-01 ENCOUNTER — Telehealth: Payer: Self-pay | Admitting: Internal Medicine

## 2021-04-01 NOTE — Telephone Encounter (Signed)
Recall for ultrasound 

## 2021-04-01 NOTE — Telephone Encounter (Signed)
Letter mailed

## 2021-04-09 DIAGNOSIS — Z923 Personal history of irradiation: Secondary | ICD-10-CM | POA: Diagnosis not present

## 2021-04-09 DIAGNOSIS — R7989 Other specified abnormal findings of blood chemistry: Secondary | ICD-10-CM | POA: Diagnosis not present

## 2021-04-09 DIAGNOSIS — R5383 Other fatigue: Secondary | ICD-10-CM | POA: Diagnosis not present

## 2021-04-09 DIAGNOSIS — K117 Disturbances of salivary secretion: Secondary | ICD-10-CM | POA: Diagnosis not present

## 2021-04-09 DIAGNOSIS — Z6824 Body mass index (BMI) 24.0-24.9, adult: Secondary | ICD-10-CM | POA: Diagnosis not present

## 2021-04-09 DIAGNOSIS — C099 Malignant neoplasm of tonsil, unspecified: Secondary | ICD-10-CM | POA: Diagnosis not present

## 2021-04-21 ENCOUNTER — Telehealth: Payer: Self-pay | Admitting: Internal Medicine

## 2021-04-21 DIAGNOSIS — K7030 Alcoholic cirrhosis of liver without ascites: Secondary | ICD-10-CM

## 2021-04-21 NOTE — Telephone Encounter (Signed)
[place] scheduled for 12/6 at 9:30am,. Arrival 9:15am, npo midnight.  Called pt, lmovm

## 2021-04-21 NOTE — Telephone Encounter (Signed)
Pt called back and is aware of appt details 

## 2021-04-21 NOTE — Addendum Note (Signed)
Addended by: Cheron Every on: 04/21/2021 02:07 PM   Modules accepted: Orders

## 2021-04-21 NOTE — Telephone Encounter (Signed)
PATIENT RECEIVED LETTER TO SCHEDULE ULTRASOUND 

## 2021-04-23 DIAGNOSIS — K469 Unspecified abdominal hernia without obstruction or gangrene: Secondary | ICD-10-CM | POA: Diagnosis not present

## 2021-04-23 DIAGNOSIS — I10 Essential (primary) hypertension: Secondary | ICD-10-CM | POA: Diagnosis not present

## 2021-04-23 DIAGNOSIS — E782 Mixed hyperlipidemia: Secondary | ICD-10-CM | POA: Diagnosis not present

## 2021-04-23 DIAGNOSIS — C61 Malignant neoplasm of prostate: Secondary | ICD-10-CM | POA: Diagnosis not present

## 2021-04-23 DIAGNOSIS — Z87891 Personal history of nicotine dependence: Secondary | ICD-10-CM | POA: Diagnosis not present

## 2021-04-23 DIAGNOSIS — R7303 Prediabetes: Secondary | ICD-10-CM | POA: Diagnosis not present

## 2021-04-23 DIAGNOSIS — F321 Major depressive disorder, single episode, moderate: Secondary | ICD-10-CM | POA: Diagnosis not present

## 2021-04-29 ENCOUNTER — Other Ambulatory Visit: Payer: Self-pay

## 2021-04-29 ENCOUNTER — Ambulatory Visit (HOSPITAL_COMMUNITY)
Admission: RE | Admit: 2021-04-29 | Discharge: 2021-04-29 | Disposition: A | Discharge status: Home / Self Care | Payer: Medicare Other | Source: Ambulatory Visit | Attending: Internal Medicine | Admitting: Internal Medicine

## 2021-04-29 DIAGNOSIS — K7030 Alcoholic cirrhosis of liver without ascites: Secondary | ICD-10-CM | POA: Diagnosis not present

## 2021-04-29 DIAGNOSIS — K8020 Calculus of gallbladder without cholecystitis without obstruction: Secondary | ICD-10-CM | POA: Diagnosis not present

## 2021-05-05 DIAGNOSIS — C61 Malignant neoplasm of prostate: Secondary | ICD-10-CM | POA: Diagnosis not present

## 2021-05-08 DIAGNOSIS — K4090 Unilateral inguinal hernia, without obstruction or gangrene, not specified as recurrent: Secondary | ICD-10-CM | POA: Diagnosis not present

## 2021-05-12 DIAGNOSIS — R3915 Urgency of urination: Secondary | ICD-10-CM | POA: Diagnosis not present

## 2021-05-12 DIAGNOSIS — C61 Malignant neoplasm of prostate: Secondary | ICD-10-CM | POA: Diagnosis not present

## 2021-06-02 DIAGNOSIS — L309 Dermatitis, unspecified: Secondary | ICD-10-CM | POA: Diagnosis not present

## 2021-06-02 DIAGNOSIS — K219 Gastro-esophageal reflux disease without esophagitis: Secondary | ICD-10-CM | POA: Diagnosis not present

## 2021-06-02 DIAGNOSIS — Z87891 Personal history of nicotine dependence: Secondary | ICD-10-CM | POA: Diagnosis not present

## 2021-06-02 DIAGNOSIS — E785 Hyperlipidemia, unspecified: Secondary | ICD-10-CM | POA: Diagnosis not present

## 2021-06-02 DIAGNOSIS — K4090 Unilateral inguinal hernia, without obstruction or gangrene, not specified as recurrent: Secondary | ICD-10-CM | POA: Diagnosis not present

## 2021-06-02 DIAGNOSIS — J309 Allergic rhinitis, unspecified: Secondary | ICD-10-CM | POA: Diagnosis not present

## 2021-06-02 DIAGNOSIS — I10 Essential (primary) hypertension: Secondary | ICD-10-CM | POA: Diagnosis not present

## 2021-06-02 DIAGNOSIS — Z8589 Personal history of malignant neoplasm of other organs and systems: Secondary | ICD-10-CM | POA: Diagnosis not present

## 2021-06-02 DIAGNOSIS — K7030 Alcoholic cirrhosis of liver without ascites: Secondary | ICD-10-CM | POA: Diagnosis not present

## 2021-06-02 DIAGNOSIS — Z7982 Long term (current) use of aspirin: Secondary | ICD-10-CM | POA: Diagnosis not present

## 2021-06-02 DIAGNOSIS — Z01812 Encounter for preprocedural laboratory examination: Secondary | ICD-10-CM | POA: Diagnosis not present

## 2021-06-02 DIAGNOSIS — F32A Depression, unspecified: Secondary | ICD-10-CM | POA: Diagnosis not present

## 2021-06-02 DIAGNOSIS — L299 Pruritus, unspecified: Secondary | ICD-10-CM | POA: Diagnosis not present

## 2021-06-16 DIAGNOSIS — F32A Depression, unspecified: Secondary | ICD-10-CM | POA: Diagnosis not present

## 2021-06-16 DIAGNOSIS — Z7982 Long term (current) use of aspirin: Secondary | ICD-10-CM | POA: Diagnosis not present

## 2021-06-16 DIAGNOSIS — K4090 Unilateral inguinal hernia, without obstruction or gangrene, not specified as recurrent: Secondary | ICD-10-CM | POA: Diagnosis not present

## 2021-06-16 DIAGNOSIS — Z85818 Personal history of malignant neoplasm of other sites of lip, oral cavity, and pharynx: Secondary | ICD-10-CM | POA: Diagnosis not present

## 2021-06-16 DIAGNOSIS — E785 Hyperlipidemia, unspecified: Secondary | ICD-10-CM | POA: Diagnosis not present

## 2021-06-16 DIAGNOSIS — L299 Pruritus, unspecified: Secondary | ICD-10-CM | POA: Diagnosis not present

## 2021-06-16 DIAGNOSIS — K7030 Alcoholic cirrhosis of liver without ascites: Secondary | ICD-10-CM | POA: Diagnosis not present

## 2021-06-16 DIAGNOSIS — L309 Dermatitis, unspecified: Secondary | ICD-10-CM | POA: Diagnosis not present

## 2021-06-16 DIAGNOSIS — K219 Gastro-esophageal reflux disease without esophagitis: Secondary | ICD-10-CM | POA: Diagnosis not present

## 2021-06-16 DIAGNOSIS — Z87891 Personal history of nicotine dependence: Secondary | ICD-10-CM | POA: Diagnosis not present

## 2021-06-16 DIAGNOSIS — I10 Essential (primary) hypertension: Secondary | ICD-10-CM | POA: Diagnosis not present

## 2021-06-16 DIAGNOSIS — Z8546 Personal history of malignant neoplasm of prostate: Secondary | ICD-10-CM | POA: Diagnosis not present

## 2021-06-16 DIAGNOSIS — J309 Allergic rhinitis, unspecified: Secondary | ICD-10-CM | POA: Diagnosis not present

## 2021-06-16 DIAGNOSIS — I7090 Unspecified atherosclerosis: Secondary | ICD-10-CM | POA: Diagnosis not present

## 2021-06-16 DIAGNOSIS — G8918 Other acute postprocedural pain: Secondary | ICD-10-CM | POA: Diagnosis not present

## 2021-06-26 ENCOUNTER — Inpatient Hospital Stay (HOSPITAL_COMMUNITY): Payer: Medicare Other

## 2021-06-27 ENCOUNTER — Inpatient Hospital Stay (HOSPITAL_COMMUNITY): Payer: Medicare Other | Attending: Hematology

## 2021-06-27 ENCOUNTER — Other Ambulatory Visit: Payer: Self-pay

## 2021-06-27 DIAGNOSIS — Z85818 Personal history of malignant neoplasm of other sites of lip, oral cavity, and pharynx: Secondary | ICD-10-CM | POA: Insufficient documentation

## 2021-06-27 DIAGNOSIS — Z9221 Personal history of antineoplastic chemotherapy: Secondary | ICD-10-CM | POA: Diagnosis not present

## 2021-06-27 DIAGNOSIS — Z7982 Long term (current) use of aspirin: Secondary | ICD-10-CM | POA: Diagnosis not present

## 2021-06-27 DIAGNOSIS — C099 Malignant neoplasm of tonsil, unspecified: Secondary | ICD-10-CM

## 2021-06-27 DIAGNOSIS — Z8546 Personal history of malignant neoplasm of prostate: Secondary | ICD-10-CM | POA: Insufficient documentation

## 2021-06-27 DIAGNOSIS — E43 Unspecified severe protein-calorie malnutrition: Secondary | ICD-10-CM

## 2021-06-27 DIAGNOSIS — F17210 Nicotine dependence, cigarettes, uncomplicated: Secondary | ICD-10-CM | POA: Diagnosis not present

## 2021-06-27 DIAGNOSIS — Z79899 Other long term (current) drug therapy: Secondary | ICD-10-CM | POA: Insufficient documentation

## 2021-06-27 DIAGNOSIS — Z923 Personal history of irradiation: Secondary | ICD-10-CM | POA: Diagnosis not present

## 2021-06-27 LAB — CBC WITH DIFFERENTIAL/PLATELET
Abs Immature Granulocytes: 0.07 10*3/uL (ref 0.00–0.07)
Basophils Absolute: 0.1 10*3/uL (ref 0.0–0.1)
Basophils Relative: 2 %
Eosinophils Absolute: 0.2 10*3/uL (ref 0.0–0.5)
Eosinophils Relative: 3 %
HCT: 42.7 % (ref 39.0–52.0)
Hemoglobin: 13.2 g/dL (ref 13.0–17.0)
Immature Granulocytes: 1 %
Lymphocytes Relative: 39 %
Lymphs Abs: 3.2 10*3/uL (ref 0.7–4.0)
MCH: 28.3 pg (ref 26.0–34.0)
MCHC: 30.9 g/dL (ref 30.0–36.0)
MCV: 91.4 fL (ref 80.0–100.0)
Monocytes Absolute: 0.7 10*3/uL (ref 0.1–1.0)
Monocytes Relative: 9 %
Neutro Abs: 3.8 10*3/uL (ref 1.7–7.7)
Neutrophils Relative %: 46 %
Platelets: 242 10*3/uL (ref 150–400)
RBC: 4.67 MIL/uL (ref 4.22–5.81)
RDW: 14 % (ref 11.5–15.5)
WBC: 8.1 10*3/uL (ref 4.0–10.5)
nRBC: 0 % (ref 0.0–0.2)

## 2021-06-27 LAB — COMPREHENSIVE METABOLIC PANEL
ALT: 14 U/L (ref 0–44)
AST: 15 U/L (ref 15–41)
Albumin: 4.1 g/dL (ref 3.5–5.0)
Alkaline Phosphatase: 56 U/L (ref 38–126)
Anion gap: 7 (ref 5–15)
BUN: 17 mg/dL (ref 8–23)
CO2: 28 mmol/L (ref 22–32)
Calcium: 9.1 mg/dL (ref 8.9–10.3)
Chloride: 103 mmol/L (ref 98–111)
Creatinine, Ser: 0.93 mg/dL (ref 0.61–1.24)
GFR, Estimated: >60 mL/min (ref >60)
Glucose, Bld: 152 mg/dL — ABNORMAL HIGH (ref 70–99)
Potassium: 4.3 mmol/L (ref 3.5–5.1)
Sodium: 138 mmol/L (ref 135–145)
Total Bilirubin: 0.4 mg/dL (ref 0.3–1.2)
Total Protein: 7.3 g/dL (ref 6.5–8.1)

## 2021-06-27 LAB — TSH: TSH: 3.8 u[IU]/mL (ref 0.350–4.500)

## 2021-07-01 NOTE — Progress Notes (Signed)
Reviewed at 12/31/20 visit by Dr. Dorsey °

## 2021-07-03 ENCOUNTER — Inpatient Hospital Stay (HOSPITAL_BASED_OUTPATIENT_CLINIC_OR_DEPARTMENT_OTHER): Payer: Medicare Other | Admitting: Hematology

## 2021-07-03 ENCOUNTER — Other Ambulatory Visit: Payer: Self-pay

## 2021-07-03 VITALS — BP 114/71 | HR 104 | Temp 98.3°F | Ht 70.47 in | Wt 185.8 lb

## 2021-07-03 DIAGNOSIS — Z85818 Personal history of malignant neoplasm of other sites of lip, oral cavity, and pharynx: Secondary | ICD-10-CM | POA: Diagnosis not present

## 2021-07-03 DIAGNOSIS — C099 Malignant neoplasm of tonsil, unspecified: Secondary | ICD-10-CM | POA: Diagnosis not present

## 2021-07-03 DIAGNOSIS — Z7982 Long term (current) use of aspirin: Secondary | ICD-10-CM | POA: Diagnosis not present

## 2021-07-03 DIAGNOSIS — J359 Chronic disease of tonsils and adenoids, unspecified: Secondary | ICD-10-CM

## 2021-07-03 DIAGNOSIS — Z8546 Personal history of malignant neoplasm of prostate: Secondary | ICD-10-CM | POA: Diagnosis not present

## 2021-07-03 DIAGNOSIS — Z923 Personal history of irradiation: Secondary | ICD-10-CM | POA: Diagnosis not present

## 2021-07-03 DIAGNOSIS — Z9221 Personal history of antineoplastic chemotherapy: Secondary | ICD-10-CM | POA: Diagnosis not present

## 2021-07-03 DIAGNOSIS — E039 Hypothyroidism, unspecified: Secondary | ICD-10-CM | POA: Diagnosis not present

## 2021-07-03 DIAGNOSIS — F17210 Nicotine dependence, cigarettes, uncomplicated: Secondary | ICD-10-CM | POA: Diagnosis not present

## 2021-07-03 NOTE — Progress Notes (Signed)
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 Left tonsillectomy on 07/09/2015 2. Chemoradiation with cisplatin x 1 cycle and carboplatin, 5-FU x 2 cycles from 09/11/2015 to 09/30/2015 and 70 Gy.  NGS Results: not done   CURRENT THERAPY: surveillance  BRIEF ONCOLOGIC HISTORY:  Oncology History  Tonsil cancer ([place])  08/02/2015 Initial Diagnosis   Cancer of tonsillar fossa ([place])   08/20/2015 Procedure   Port-a-cath placed by IR   08/20/2015 Procedure   G-tube placed by IR   08/21/2015 - 10/09/2015 Radiation Therapy   70 Gy, Dr. Isidore Moos.   08/21/2015 - 09/10/2015 Chemotherapy   Cisplatin every 21 days with XRT.   08/28/2015 Adverse Reaction   Tinnitis, Cisplatin-induced.   09/11/2015 - 09/30/2015 Chemotherapy   Carboplatin/5FU   09/20/2015 - 09/21/2015 Hospital Admission   Intractable nausea/vomiting. Severe Mucositis   10/17/2015 - 10/20/2015 Hospital Admission   Esophageal/oral candidasis. Shingles. Fever. Chemotherapy induced pancytopenia   01/24/2016 PET scan   Resolution of the left tonsillar mass and left neck adenopathy. No findings for residual or locally recurrent tumor, adenopathy or distant metastatic disease.   03/28/2016 - 04/08/2016 Hospital Admission   Admitted with severe abdominal pain. Found to diffuse peritonitis from perforated sigmoid colon requiring colostomy. Discharged to [facility] in [place].         CANCER STAGING:  Cancer Staging  Malignant neoplasm of prostate [facility]) Staging form: Prostate, AJCC 8th Edition - Clinical stage from 02/08/2020: Stage IIC (cT1c, cN0, cM0, PSA: 5.9, Grade Group: 3) - Unsigned  Tonsil cancer ([place]) Staging form: Pharynx - Oropharynx, AJCC 7th Edition - Clinical: Stage IVA (T2, N2b, M0)  - Signed by Eppie Gibson, MD on 08/02/2015   INTERVAL HISTORY:  [name], a 71 y.o. male, returns for routine follow-up of his left tonsillar cancer HPV positive. Trevonn was last seen on 06/27/2020.   Today he reports feeling good. He denies hemoptysis and trouble swallowing.   REVIEW OF SYSTEMS:  Review of Systems  Constitutional:  Negative for appetite change and fatigue.  HENT:   Negative for trouble swallowing.   Respiratory:  Negative for hemoptysis.   Gastrointestinal:  Positive for constipation and diarrhea.  Genitourinary:  Positive for frequency.   All other systems reviewed and are negative.  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 Benjamine Mola, 06/2015   Iron deficiency anemia 05/07/2016   Melanoma ([place]) 1994   right calf   Mixed hyperlipidemia    Portal hypertension ([place])    Prostate cancer ([place])    Tonsillar cancer ([place]) 06/2015   SCC chemo and radiation done   Wears contact lenses    in left eye   Past Surgical History:  Procedure Laterality Date   BIOPSY  07/02/2016   Procedure: BIOPSY;  Surgeon: Herbie Baltimore  Hilton Cork, MD;  [facility];  Service: Endoscopy;;  esophageal   COLON SURGERY  2017   ruptured colon   COLONOSCOPY WITH PROPOFOL N/A 07/02/2016   Procedure: COLONOSCOPY WITH PROPOFOL;  Surgeon: Daneil Dolin, MD;  [facility];  Service: Endoscopy;  Laterality: N/A;  9:45am   COLOSTOMY TAKEDOWN N/A 09/02/2016   Procedure: LAPAROSCOPIC COLOSTOMY REVERSAL;  Surgeon: Leighton Ruff, MD;  [facility];  Service: General;  Laterality: N/A;    ESOPHAGOGASTRODUODENOSCOPY (EGD) WITH PROPOFOL N/A 07/02/2016   Procedure: ESOPHAGOGASTRODUODENOSCOPY (EGD) WITH PROPOFOL;  Surgeon: Daneil Dolin, MD;  [facility];  Service: Endoscopy;  Laterality: N/A;   ESOPHAGOGASTRODUODENOSCOPY (EGD) WITH PROPOFOL N/A 12/28/2019   Procedure: ESOPHAGOGASTRODUODENOSCOPY (EGD) WITH PROPOFOL;  Surgeon: Daneil Dolin, MD;  [facility];  Service: Endoscopy;  Laterality: N/A;  11:00am   HERNIA REPAIR Right 2019   [facility] P[facility] with [place]  09/02/2016   Procedure: HERNIA REPAIR INCISIONAL;  Surgeon: Leighton Ruff, MD;  [facility];  Service: General;;   LAPAROTOMY N/A 03/28/2016   Procedure: EXPLORATORY LAPAROTOMY, SIGMOID COLECTOMY, COLOSTOMY;  Surgeon: Leighton Ruff, MD;  [facility];  Service: General;  Laterality: N/A;   MALONEY DILATION N/A 07/02/2016   Procedure: Keturah Shavers;  Surgeon: Daneil Dolin, MD;  [facility];  Service: Endoscopy;  Laterality: N/A;   MOUTH SURGERY  2019   left lower jaw   PORT-A-CATH REMOVAL  09/02/2016   Procedure: REMOVAL PORT-A-CATH;  Surgeon: Leighton Ruff, MD;  [facility];  Service: General;;   PROSTATE BIOPSY  2021   RADIOACTIVE SEED IMPLANT N/A 06/28/2020   Procedure: RADIOACTIVE SEED IMPLANT/BRACHYTHERAPY IMPLANT WITH CYSTOSCOPY;  Surgeon: Janith Lima, MD;  Location[facility];  Service: Urology;  Laterality: N/A;   REMOVAL OF GASTROSTOMY TUBE N/A 09/02/2016   Procedure: REMOVAL OF GASTROSTOMY TUBE;  Surgeon: Leighton Ruff, MD;  [facility];  Service: General;  Laterality: N/A;   SPACE OAR INSTILLATION N/A 06/28/2020   Procedure: SPACE OAR INSTILLATION;  Surgeon: Janith Lima, MD;  [facility];  Service: Urology;  Laterality: N/A;   TONSILLECTOMY Left 07/09/2015   Procedure: LEFT TONSILLECTOMY;  Surgeon: Leta Baptist, MD;  Location: [facility];  Service: ENT;  Laterality: Left;    TONSILLECTOMY  2017   left tonsil   Z-PLASTY SCAR REVISION N/A 09/02/2016   Procedure: SCAR REVISION;  Surgeon: Leighton Ruff, MD;  [facility];  Service: General;  Laterality: N/A;    SOCIAL HISTORY:  Social History   Socioeconomic History   Marital status: Single    Spouse name: Not on file   Number of children: 0   Years of education: Not on file   Highest education level: Not on file  Occupational History   Not on file  Tobacco Use   Smoking status: Some Days    Packs/day: 0.50    Years: 30.00    Pack years: 15.00    Types: Cigarettes    Last attempt to quit: 11/22/2016    Years since quitting: 4.6   Smokeless tobacco: Never   Tobacco comments:    smoke social occ  Vaping Use   Vaping Use: Never used  Substance and Sexual Activity   Alcohol use: No    Alcohol/week: 0.0 standard drinks    Comment: former abuse- quit 09/2011   Drug use: No   Sexual  activity: Not Currently  Other Topics Concern   Not on file  Social History Narrative   Divorced.   No children.   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    Has been smoking off/on since age 32, quit smoking 11/2016.    Exercises minimally   Eats all food groups.   Wears seatbelt.   Mother lives with him, has dementia.   Attends AA meetings.    Social Determinants of Health   Financial Resource Strain: Not on file  Food Insecurity: Not on file  Transportation Needs: Not on file  Physical Activity: Not on file  Stress: Not on file  Social Connections: Not on file  Intimate Partner Violence: Not on file    FAMILY HISTORY:  Family History  Problem Relation Age of Onset   Macular degeneration Mother    Dementia Mother    Alcoholism Father    Prostate cancer Father    Diabetes Maternal Aunt    Diabetes Maternal Uncle    Prostate cancer Maternal Uncle    Mental illness Sister    Pancreatic cancer Neg Hx    Breast  cancer Neg Hx    Colon cancer Neg Hx     CURRENT MEDICATIONS:  Current Outpatient Medications  Medication Sig Dispense Refill   aspirin EC 81 MG tablet Take 1 tablet (81 mg total) by mouth daily.     atorvastatin (LIPITOR) 40 MG tablet Take 1 tablet (40 mg total) by mouth daily. (Patient taking differently: Take 40 mg by mouth at bedtime.) 90 tablet 0   cetirizine (ZYRTEC) 10 MG tablet Take 10 mg by mouth daily as needed for allergies.      CHANTIX STARTING MONTH PAK 0.5 MG X 11 & 1 MG X 42 tablet Take 1 mg by mouth 2 (two) times daily.     chlorhexidine (PERIDEX) 0.12 % solution 15 mLs as needed.     Cholecalciferol 25 MCG (1000 UT) tablet Take by mouth.     DEXILANT 60 MG capsule Take 1 capsule (60 mg total) by mouth daily. 30 capsule 11   docusate sodium (COLACE) 100 MG capsule Take 1 capsule (100 mg total) by mouth daily as needed for up to 30 doses. 30 capsule 0   DULoxetine (CYMBALTA) 60 MG capsule Take 120 mg by mouth daily.     fluticasone (FLONASE) 50 MCG/ACT nasal spray Place 2 sprays into both nostrils daily.     GEMTESA 75 MG TABS Take 1 tablet by mouth daily.     lisinopril (ZESTRIL) 5 MG tablet 1 tablet     Melatonin 10 MG TABS Take 1 tablet by mouth as needed.      MULTIPLE VITAMINS-MINERALS ER PO Take 1 tablet by mouth daily.      oxybutynin (DITROPAN-XL) 5 MG 24 hr tablet Take 5 mg by mouth daily.     oxyCODONE-acetaminophen (PERCOCET) 5-325 MG tablet Take 1 tablet by mouth every 4 (four) hours as needed for up to 18 doses for severe pain. 18 tablet 0   pilocarpine (SALAGEN) 5 MG tablet Take by mouth.     silodosin (RAPAFLO) 8 MG CAPS capsule Take 8 mg by mouth every evening.     sodium fluoride (FLUORISHIELD) 1.1 % GEL dental gel Instill one drop of gel per tooth space of fluoride tray. Place over teeth for 5 minutes. Remove. Spit out excess. Repeat nightly.  125 mL 11   tamsulosin (FLOMAX) 0.4 MG CAPS capsule Take 0.8 mg by mouth at bedtime.     triamcinolone (KENALOG)  0.1 % paste SMARTSIG:TO TEETH 4 Times Daily     triamcinolone cream (KENALOG) 0.1 % Apply 1 application topically daily.  2   Trospium Chloride 60 MG CP24 Take 1 capsule by mouth daily.     No current facility-administered medications for this visit.   Facility-Administered Medications Ordered in Other Visits  Medication Dose Route Frequency Provider Last Rate Last Admin   0.9 %  sodium chloride infusion   Intravenous Continuous Kefalas, Manon Hilding, PA-C   New Bag at 03/28/16 1754   0.9 %  sodium chloride infusion   Intravenous Continuous Kefalas, Marcello Moores S, PA-C       0.9 %  sodium chloride infusion   Intravenous Continuous Penland, Larene Beach K, MD       0.9 %  sodium chloride infusion   Intravenous Continuous Penland, Kelby Fam, MD       0.9 %  sodium chloride infusion   Intravenous Continuous Penland, Kelby Fam, MD        ALLERGIES:  Allergies  Allergen Reactions   Bee Venom Swelling and Anaphylaxis   Other Other (See Comments)    Opthalmic mycin irritates eyes.     Tobramycin Ophthalmic [Tobramycin]     Other reaction(s): DRYNESS, IRRITATION    PHYSICAL EXAM:  Performance status (ECOG): 1 - Symptomatic but completely ambulatory  Vitals:   07/03/21 0817  BP: 114/71  Pulse: (!) 104  Temp: 98.3 F (36.8 C)  SpO2: 95%   Wt Readings from Last 3 Encounters:  07/03/21 185 lb 12.8 oz (84.3 kg)  12/31/20 176 lb 8 oz (80.1 kg)  07/25/20 172 lb 4 oz (78.1 kg)   Physical Exam Vitals reviewed.  Constitutional:      Appearance: Normal appearance.  HENT:     Mouth/Throat:     Mouth: No oral lesions.     Tongue: No lesions.  Cardiovascular:     Rate and Rhythm: Normal rate and regular rhythm.     Pulses: Normal pulses.     Heart sounds: Normal heart sounds.  Pulmonary:     Effort: Pulmonary effort is normal.     Breath sounds: Normal breath sounds.  Abdominal:     Palpations: Abdomen is soft. There is no hepatomegaly, splenomegaly or mass.     Tenderness: There is no  abdominal tenderness.  Lymphadenopathy:     Cervical: No cervical adenopathy.     Right cervical: No superficial, deep or posterior cervical adenopathy.    Left cervical: No superficial, deep or posterior cervical adenopathy.     Upper Body:     Right upper body: No supraclavicular adenopathy.     Left upper body: No supraclavicular adenopathy.  Neurological:     General: No focal deficit present.     Mental Status: He is alert and oriented to person, place, and time.  Psychiatric:        Mood and Affect: Mood normal.        Behavior: Behavior normal.     LABORATORY DATA:  I have reviewed the labs as listed.  CBC Latest Ref Rng & Units 06/27/2021 12/24/2020 06/20/2020  WBC 4.0 - 10.5 K/uL 8.1 5.8 7.6  Hemoglobin 13.0 - 17.0 g/dL 13.2 12.9(L) 14.1  Hematocrit 39.0 - 52.0 % 42.7 42.2 46.0  Platelets 150 - 400 K/uL 242 279 297   CMP Latest Ref  Rng & Units 06/27/2021 12/24/2020 06/20/2020  Glucose 70 - 99 mg/dL 152(H) 136(H) 123(H)  BUN 8 - 23 mg/dL 17 14 17   Creatinine 0.61 - 1.24 mg/dL 0.93 0.79 0.87  Sodium 135 - 145 mmol/L 138 137 136  Potassium 3.5 - 5.1 mmol/L 4.3 4.5 4.4  Chloride 98 - 111 mmol/L 103 103 101  CO2 22 - 32 mmol/L 28 28 27   Calcium 8.9 - 10.3 mg/dL 9.1 9.3 9.4  Total Protein 6.5 - 8.1 g/dL 7.3 6.9 7.1  Total Bilirubin 0.3 - 1.2 mg/dL 0.4 0.7 0.7  Alkaline Phos 38 - 126 U/L 56 53 47  AST 15 - 41 U/L 15 17 18   ALT 0 - 44 U/L 14 19 16     DIAGNOSTIC IMAGING:  I have independently reviewed the scans and discussed with the patient. No results found.   ASSESSMENT:  1.  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   2.  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 Benjamine Mola, referred to  Dr. Nicolette Bang who did marginal mandibulectomy with primary closure on 09/20/2017. -This showed 2.1 x 0.9 x 0.4 cm squamous cell carcinoma with mandibular bone invasion.  3.  Prostate cancer: - Stage T1c adenocarcinoma, Gleason 4+3, PSA 5.91 - Status post seed implantation by Dr. Tammi Klippel.   PLAN:  1.  Stage IV left tonsillar cancer: -Physical examination did not reveal any tonsillar masses.  No palpable adenopathy in the neck region. - Reviewed labs today which showed normal LFTs and CBC.  TSH was normal at 3.8. - I will see him back in 6 months with repeat CT scan of the soft tissues of the neck.   2.  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 - Continue follow-ups with Dr. Nicolette Bang and Dr. Benjamine Mola.   3.  Smoking history: - I have recommended a low-dose CT scan in August of this year.    Orders placed this encounter:  No orders of the defined types were placed in this encounter.    Derek Jack, MD [place] [phone]   I, Thana Ates, am acting as a scribe for Dr. Derek Jack.  I, Derek Jack MD, have reviewed the above documentation for accuracy and completeness, and I agree with the above.

## 2021-07-03 NOTE — Patient Instructions (Signed)
[place] at [facility] Discharge Instructions   You were seen and examined today by Dr. Delton Coombes.  He reviewed your lab work which is normal/stable.  We will obtain a CT scan of your neck prior to your next visit.  We will also arrange for you to have a low dose CT scan of your chest for lung cancer screening.   Return as scheduled in 6 months.    Thank you for choosing [place] at [facility] to provide your oncology and hematology care.  To afford each patient quality time with our provider, please arrive at least 15 minutes before your scheduled appointment time.   If you have a lab appointment with the [facility] please come in thru the Main Entrance and check in at the main information desk.  You need to re-schedule your appointment should you arrive 10 or more minutes late.  We strive to give you quality time with our providers, and arriving late affects you and other patients whose appointments are after yours.  Also, if you no show three or more times for appointments you may be dismissed from the clinic at the providers discretion.     Again, thank you for choosing [facility].  Our hope is that these requests will decrease the amount of time that you wait before being seen by our physicians.       _____________________________________________________________  Should you have questions after your visit to [facility], please contact our office at [phone] and follow the prompts.  Our office hours are 8:00 a.m. and 4:30 p.m. Monday - Friday.  Please note that voicemails left after 4:00 p.m. may not be returned until the following business day.  We are closed weekends and major holidays.  You do have access to a nurse 24-7, just call the main number to the clinic [phone] and do not press any options, hold on the line and a nurse will answer the phone.    For prescription refill requests, have  your pharmacy contact our office and allow 72 hours.    Due to Covid, you will need to wear a mask upon entering the hospital. If you do not have a mask, a mask will be given to you at the Main Entrance upon arrival. For doctor visits, patients may have 1 support person age 56 or older with them. For treatment visits, patients can not have anyone with them due to social distancing guidelines and our immunocompromised population.

## 2021-07-04 DIAGNOSIS — I700 Atherosclerosis of aorta: Secondary | ICD-10-CM | POA: Diagnosis not present

## 2021-07-04 DIAGNOSIS — F17201 Nicotine dependence, unspecified, in remission: Secondary | ICD-10-CM | POA: Diagnosis not present

## 2021-07-04 DIAGNOSIS — R7303 Prediabetes: Secondary | ICD-10-CM | POA: Diagnosis not present

## 2021-07-04 DIAGNOSIS — C61 Malignant neoplasm of prostate: Secondary | ICD-10-CM | POA: Diagnosis not present

## 2021-07-04 DIAGNOSIS — F321 Major depressive disorder, single episode, moderate: Secondary | ICD-10-CM | POA: Diagnosis not present

## 2021-07-04 DIAGNOSIS — I10 Essential (primary) hypertension: Secondary | ICD-10-CM | POA: Diagnosis not present

## 2021-07-04 DIAGNOSIS — F1911 Other psychoactive substance abuse, in remission: Secondary | ICD-10-CM | POA: Diagnosis not present

## 2021-07-04 DIAGNOSIS — E782 Mixed hyperlipidemia: Secondary | ICD-10-CM | POA: Diagnosis not present

## 2021-07-22 DIAGNOSIS — Z85820 Personal history of malignant melanoma of skin: Secondary | ICD-10-CM | POA: Diagnosis not present

## 2021-07-22 DIAGNOSIS — L309 Dermatitis, unspecified: Secondary | ICD-10-CM | POA: Diagnosis not present

## 2021-07-22 DIAGNOSIS — Z85828 Personal history of other malignant neoplasm of skin: Secondary | ICD-10-CM | POA: Diagnosis not present

## 2021-07-22 DIAGNOSIS — L570 Actinic keratosis: Secondary | ICD-10-CM | POA: Diagnosis not present

## 2021-08-11 ENCOUNTER — Ambulatory Visit (HOSPITAL_COMMUNITY): Payer: Medicare Other

## 2021-08-17 ENCOUNTER — Other Ambulatory Visit: Payer: Self-pay | Admitting: Gastroenterology

## 2021-08-18 DIAGNOSIS — H2513 Age-related nuclear cataract, bilateral: Secondary | ICD-10-CM | POA: Diagnosis not present

## 2021-08-18 NOTE — Telephone Encounter (Signed)
Last ov 06/07/20 ?

## 2021-09-02 DIAGNOSIS — C039 Malignant neoplasm of gum, unspecified: Secondary | ICD-10-CM | POA: Diagnosis not present

## 2021-09-09 DIAGNOSIS — H35373 Puckering of macula, bilateral: Secondary | ICD-10-CM | POA: Diagnosis not present

## 2021-09-09 DIAGNOSIS — Z01818 Encounter for other preprocedural examination: Secondary | ICD-10-CM | POA: Diagnosis not present

## 2021-09-09 DIAGNOSIS — H25812 Combined forms of age-related cataract, left eye: Secondary | ICD-10-CM | POA: Diagnosis not present

## 2021-09-09 DIAGNOSIS — H25811 Combined forms of age-related cataract, right eye: Secondary | ICD-10-CM | POA: Diagnosis not present

## 2021-09-26 DIAGNOSIS — H25812 Combined forms of age-related cataract, left eye: Secondary | ICD-10-CM | POA: Diagnosis not present

## 2021-10-08 DIAGNOSIS — S338XXA Sprain of other parts of lumbar spine and pelvis, initial encounter: Secondary | ICD-10-CM | POA: Diagnosis not present

## 2021-10-08 DIAGNOSIS — M9903 Segmental and somatic dysfunction of lumbar region: Secondary | ICD-10-CM | POA: Diagnosis not present

## 2021-10-08 DIAGNOSIS — S134XXA Sprain of ligaments of cervical spine, initial encounter: Secondary | ICD-10-CM | POA: Diagnosis not present

## 2021-10-08 DIAGNOSIS — M9901 Segmental and somatic dysfunction of cervical region: Secondary | ICD-10-CM | POA: Diagnosis not present

## 2021-10-08 DIAGNOSIS — S233XXA Sprain of ligaments of thoracic spine, initial encounter: Secondary | ICD-10-CM | POA: Diagnosis not present

## 2021-10-08 DIAGNOSIS — M9902 Segmental and somatic dysfunction of thoracic region: Secondary | ICD-10-CM | POA: Diagnosis not present

## 2021-10-10 DIAGNOSIS — H25811 Combined forms of age-related cataract, right eye: Secondary | ICD-10-CM | POA: Diagnosis not present

## 2021-10-10 DIAGNOSIS — H2511 Age-related nuclear cataract, right eye: Secondary | ICD-10-CM | POA: Diagnosis not present

## 2021-10-13 ENCOUNTER — Telehealth (HOSPITAL_COMMUNITY): Payer: Self-pay | Admitting: *Deleted

## 2021-10-13 DIAGNOSIS — S134XXA Sprain of ligaments of cervical spine, initial encounter: Secondary | ICD-10-CM | POA: Diagnosis not present

## 2021-10-13 DIAGNOSIS — M9903 Segmental and somatic dysfunction of lumbar region: Secondary | ICD-10-CM | POA: Diagnosis not present

## 2021-10-13 DIAGNOSIS — M9902 Segmental and somatic dysfunction of thoracic region: Secondary | ICD-10-CM | POA: Diagnosis not present

## 2021-10-13 DIAGNOSIS — S338XXA Sprain of other parts of lumbar spine and pelvis, initial encounter: Secondary | ICD-10-CM | POA: Diagnosis not present

## 2021-10-13 DIAGNOSIS — S233XXA Sprain of ligaments of thoracic spine, initial encounter: Secondary | ICD-10-CM | POA: Diagnosis not present

## 2021-10-13 DIAGNOSIS — M9901 Segmental and somatic dysfunction of cervical region: Secondary | ICD-10-CM | POA: Diagnosis not present

## 2021-10-13 NOTE — Telephone Encounter (Signed)
Patient called stating that he is experiencing upper body aches x 2 months and has a call in to PCP for this.  In addition, states he has recently noticed new rectal bleeding.  Advised him to call and make an appointment with Dr. Gala Romney for evaluation of this and follow up with [place] if deemed necessary.  Verbalized understanding.

## 2021-10-15 ENCOUNTER — Other Ambulatory Visit: Payer: Self-pay | Admitting: Physician Assistant

## 2021-10-15 ENCOUNTER — Ambulatory Visit
Admission: RE | Admit: 2021-10-15 | Discharge: 2021-10-15 | Disposition: A | Discharge status: Home / Self Care | Payer: Medicare Other | Source: Ambulatory Visit | Attending: Physician Assistant | Admitting: Physician Assistant

## 2021-10-15 DIAGNOSIS — D649 Anemia, unspecified: Secondary | ICD-10-CM | POA: Diagnosis not present

## 2021-10-15 DIAGNOSIS — R058 Other specified cough: Secondary | ICD-10-CM

## 2021-10-15 DIAGNOSIS — R5383 Other fatigue: Secondary | ICD-10-CM | POA: Diagnosis not present

## 2021-10-15 DIAGNOSIS — K625 Hemorrhage of anus and rectum: Secondary | ICD-10-CM | POA: Diagnosis not present

## 2021-10-15 DIAGNOSIS — R059 Cough, unspecified: Secondary | ICD-10-CM | POA: Diagnosis not present

## 2021-10-15 DIAGNOSIS — R1084 Generalized abdominal pain: Secondary | ICD-10-CM | POA: Diagnosis not present

## 2021-10-21 ENCOUNTER — Telehealth: Payer: Self-pay

## 2021-10-21 ENCOUNTER — Encounter: Payer: Self-pay | Admitting: Internal Medicine

## 2021-10-21 DIAGNOSIS — S233XXA Sprain of ligaments of thoracic spine, initial encounter: Secondary | ICD-10-CM | POA: Diagnosis not present

## 2021-10-21 DIAGNOSIS — M9903 Segmental and somatic dysfunction of lumbar region: Secondary | ICD-10-CM | POA: Diagnosis not present

## 2021-10-21 DIAGNOSIS — M9902 Segmental and somatic dysfunction of thoracic region: Secondary | ICD-10-CM | POA: Diagnosis not present

## 2021-10-21 DIAGNOSIS — M9901 Segmental and somatic dysfunction of cervical region: Secondary | ICD-10-CM | POA: Diagnosis not present

## 2021-10-21 DIAGNOSIS — S134XXA Sprain of ligaments of cervical spine, initial encounter: Secondary | ICD-10-CM | POA: Diagnosis not present

## 2021-10-21 DIAGNOSIS — S338XXA Sprain of other parts of lumbar spine and pelvis, initial encounter: Secondary | ICD-10-CM | POA: Diagnosis not present

## 2021-10-21 NOTE — Telephone Encounter (Signed)
Received outside provider labs for this patient. On desk for review.

## 2021-10-21 NOTE — Progress Notes (Signed)
Received outside labs on this patient who has not been seen in this office in about a year and a half from [place] [facility] [phone] labs from 5/24 2023 TSH 5.56 white count 11.9 H&H 10.4/32.4 MCV 78 platelet count 410,000 total bilirubin 0.6 albumin 3.6 alkaline phosphatase 161 AS T22/ALT 26.  I am unsure whether or not this patient is following up with [place] or not.  Erline Levine, do we have a follow-up appointment scheduled.  Can we reach out to patient and see if he is interested in following back up with [place]?

## 2021-10-28 DIAGNOSIS — M9901 Segmental and somatic dysfunction of cervical region: Secondary | ICD-10-CM | POA: Diagnosis not present

## 2021-10-28 DIAGNOSIS — M9903 Segmental and somatic dysfunction of lumbar region: Secondary | ICD-10-CM | POA: Diagnosis not present

## 2021-10-28 DIAGNOSIS — M9902 Segmental and somatic dysfunction of thoracic region: Secondary | ICD-10-CM | POA: Diagnosis not present

## 2021-10-28 DIAGNOSIS — S338XXA Sprain of other parts of lumbar spine and pelvis, initial encounter: Secondary | ICD-10-CM | POA: Diagnosis not present

## 2021-10-28 DIAGNOSIS — S134XXA Sprain of ligaments of cervical spine, initial encounter: Secondary | ICD-10-CM | POA: Diagnosis not present

## 2021-10-28 DIAGNOSIS — S233XXA Sprain of ligaments of thoracic spine, initial encounter: Secondary | ICD-10-CM | POA: Diagnosis not present

## 2021-10-31 DIAGNOSIS — H6123 Impacted cerumen, bilateral: Secondary | ICD-10-CM | POA: Diagnosis not present

## 2021-10-31 DIAGNOSIS — R7303 Prediabetes: Secondary | ICD-10-CM | POA: Diagnosis not present

## 2021-10-31 DIAGNOSIS — I10 Essential (primary) hypertension: Secondary | ICD-10-CM | POA: Diagnosis not present

## 2021-10-31 DIAGNOSIS — E782 Mixed hyperlipidemia: Secondary | ICD-10-CM | POA: Diagnosis not present

## 2021-10-31 DIAGNOSIS — Z8701 Personal history of pneumonia (recurrent): Secondary | ICD-10-CM | POA: Diagnosis not present

## 2021-10-31 DIAGNOSIS — C61 Malignant neoplasm of prostate: Secondary | ICD-10-CM | POA: Diagnosis not present

## 2021-10-31 DIAGNOSIS — D649 Anemia, unspecified: Secondary | ICD-10-CM | POA: Diagnosis not present

## 2021-10-31 DIAGNOSIS — F1911 Other psychoactive substance abuse, in remission: Secondary | ICD-10-CM | POA: Diagnosis not present

## 2021-10-31 DIAGNOSIS — Z0000 Encounter for general adult medical examination without abnormal findings: Secondary | ICD-10-CM | POA: Diagnosis not present

## 2021-10-31 DIAGNOSIS — Z23 Encounter for immunization: Secondary | ICD-10-CM | POA: Diagnosis not present

## 2021-10-31 DIAGNOSIS — I2510 Atherosclerotic heart disease of native coronary artery without angina pectoris: Secondary | ICD-10-CM | POA: Diagnosis not present

## 2021-11-03 DIAGNOSIS — C61 Malignant neoplasm of prostate: Secondary | ICD-10-CM | POA: Diagnosis not present

## 2021-11-04 ENCOUNTER — Encounter: Payer: Self-pay | Admitting: Internal Medicine

## 2021-11-04 ENCOUNTER — Ambulatory Visit (INDEPENDENT_AMBULATORY_CARE_PROVIDER_SITE_OTHER): Payer: Medicare Other | Admitting: Internal Medicine

## 2021-11-04 VITALS — BP 120/80 | HR 88 | Temp 97.8°F | Ht 69.0 in | Wt 176.8 lb

## 2021-11-04 DIAGNOSIS — K219 Gastro-esophageal reflux disease without esophagitis: Secondary | ICD-10-CM | POA: Diagnosis not present

## 2021-11-04 DIAGNOSIS — K921 Melena: Secondary | ICD-10-CM

## 2021-11-04 DIAGNOSIS — K2100 Gastro-esophageal reflux disease with esophagitis, without bleeding: Secondary | ICD-10-CM

## 2021-11-04 NOTE — Patient Instructions (Signed)
It was good to see you again today!  As discussed, further evaluation of rectal bleeding warranted.  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  We will schedule a flexible sigmoidoscopy in the near future at the hospital for rectal bleeding (ASA 3)  Continue Dexilant 60 mg daily for GERD  We can hold off on looking at your upper GI tract again until 2025.  Further recommendations to follow after sigmoidoscopy has been performed.

## 2021-11-04 NOTE — Progress Notes (Unsigned)
Primary Care Physician:  Caren Macadam, MD Primary Gastroenterologist:  Dr. Gala Romney  Pre-Procedure History & Physical: HPI:  Frank Castillo is a 71 y.o. male here for further evaluation of rectal bleeding.  Sees blood when he wipes.  Had radiation seeds placed by Dr. Tammi Klippel 1 year ago.  Occasionally constipated since he went on his new bladder medication, Gemtesa. History of diverting colostomy resection for ruptured colon previously.  Colonoscopy through stoma and per Hartman's pouch done in 2018 prior to reanastomosis which was done by Dr. Marcello Moores. He takes fiber supplement and OTC laxatives on occasion.  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   Wears contact lenses    in left eye    Past Surgical History:  Procedure Laterality Date   BIOPSY  07/02/2016   Procedure: BIOPSY;  Surgeon: Daneil Dolin, MD;  [facility];  Service: Endoscopy;;  esophageal   COLON SURGERY  2017   ruptured colon   COLONOSCOPY WITH PROPOFOL N/A 07/02/2016   Procedure: COLONOSCOPY WITH PROPOFOL;  Surgeon: Daneil Dolin, MD;  [facility];  Service: Endoscopy;  Laterality: N/A;  9:45am   COLOSTOMY TAKEDOWN N/A 09/02/2016   Procedure: LAPAROSCOPIC COLOSTOMY REVERSAL;  Surgeon: Leighton Ruff, MD;  [facility];  Service: General;  Laterality: N/A;   ESOPHAGOGASTRODUODENOSCOPY (EGD) WITH  PROPOFOL N/A 07/02/2016   Procedure: ESOPHAGOGASTRODUODENOSCOPY (EGD) WITH PROPOFOL;  Surgeon: Daneil Dolin, MD;  [facility];  Service: Endoscopy;  Laterality: N/A;   ESOPHAGOGASTRODUODENOSCOPY (EGD) WITH PROPOFOL N/A 12/28/2019   Procedure: ESOPHAGOGASTRODUODENOSCOPY (EGD) WITH PROPOFOL;  Surgeon: Daneil Dolin, MD;  [facility];  Service: Endoscopy;  Laterality: N/A;  11:00am   HERNIA REPAIR Right 2019   dr Shawnie Pons [facility]  09/02/2016   Procedure: HERNIA REPAIR INCISIONAL;  Surgeon: Leighton Ruff, MD;  [facility];  Service: General;;   LAPAROTOMY N/A 03/28/2016   Procedure: EXPLORATORY LAPAROTOMY, SIGMOID COLECTOMY, COLOSTOMY;  Surgeon: Leighton Ruff, MD;  [facility];  Service: General;  Laterality: N/A;   MALONEY DILATION N/A 07/02/2016   Procedure: Keturah Shavers;  Surgeon: Daneil Dolin, MD;  [facility];  Service: Endoscopy;  Laterality: N/A;   MOUTH SURGERY  2019   left lower jaw   PORT-A-CATH REMOVAL  09/02/2016   Procedure: REMOVAL PORT-A-CATH;  Surgeon: Leighton Ruff, MD;  [facility];  Service: General;;   PROSTATE BIOPSY  2021   RADIOACTIVE SEED IMPLANT N/A 06/28/2020   Procedure: RADIOACTIVE SEED IMPLANT/BRACHYTHERAPY IMPLANT WITH CYSTOSCOPY;  Surgeon: Janith Lima, MD;  Location[facility];  Service: Urology;  Laterality: N/A;   REMOVAL OF GASTROSTOMY TUBE N/A 09/02/2016   Procedure: REMOVAL OF GASTROSTOMY  TUBE;  Surgeon: Leighton Ruff, MD;  [facility];  Service: General;  Laterality: N/A;   SPACE OAR INSTILLATION N/A 06/28/2020   Procedure: SPACE OAR INSTILLATION;  Surgeon: Janith Lima, MD;  Location: [facility];  Service: Urology;  Laterality: N/A;   TONSILLECTOMY Left 07/09/2015   Procedure: LEFT TONSILLECTOMY;  Surgeon: Leta Baptist, MD;  Location: [place];  Service: ENT;  Laterality: Left;   TONSILLECTOMY  2017   left tonsil    Z-PLASTY SCAR REVISION N/A 09/02/2016   Procedure: SCAR REVISION;  Surgeon: Leighton Ruff, MD;  [facility];  Service: General;  Laterality: N/A;    Prior to Admission medications   Medication Sig Start Date End Date Taking? Authorizing Provider  aspirin EC 81 MG tablet Take 1 tablet (81 mg total) by mouth daily. Patient taking differently: Take 81 mg by mouth 2 (two) times a week. 06/17/17  Yes Caren Macadam, MD  atorvastatin (LIPITOR) 40 MG tablet Take 1 tablet (40 mg total) by mouth daily. Patient taking differently: Take 40 mg by mouth at bedtime. 11/29/17  Yes Hagler, [name], MD  B Complex Vitamins (VITAMIN-B COMPLEX PO) Take by mouth.   Yes [provider]  cetirizine (ZYRTEC) 10 MG tablet Take 10 mg by mouth daily as needed for allergies.    Yes [provider]  chlorhexidine (PERIDEX) 0.12 % solution 15 mLs as needed. 11/29/19  Yes [provider]  DEXILANT 60 MG capsule TAKE 1 CAPSULE BY MOUTH DAILY 08/18/21  Yes Yancy Knoble, Cristopher Estimable, MD  docusate sodium (COLACE) 100 MG capsule Take 1 capsule (100 mg total) by mouth daily as needed for up to 30 doses. Patient taking differently: Take 250 mg by mouth 2 (two) times daily. 06/28/20  Yes Janith Lima, MD  DULoxetine (CYMBALTA) 60 MG capsule Take 120 mg by mouth daily. 11/03/18  Yes [provider]  fluticasone (FLONASE) 50 MCG/ACT nasal spray Place 2 sprays into both nostrils daily.   Yes [provider]  GEMTESA 75 MG TABS Take 1 tablet by mouth daily. 06/28/21  Yes [provider]  lisinopril (ZESTRIL) 5 MG tablet 1 tablet 05/20/17  Yes [provider]  pilocarpine (SALAGEN) 5 MG tablet Take by mouth. 04/09/21  Yes [provider]  sodium fluoride (FLUORISHIELD) 1.1 % GEL dental gel Instill one drop of gel per tooth space of fluoride tray. Place over teeth for 5 minutes. Remove. Spit out excess. Repeat nightly. 03/16/17  Yes Hagler, [name], MD  tamsulosin (FLOMAX) 0.4 MG CAPS  capsule Take 0.8 mg by mouth at bedtime. 11/18/20  Yes [provider]  triamcinolone (KENALOG) 0.1 % paste SMARTSIG:TO TEETH 4 Times Daily 10/04/20  Yes [provider]    Allergies as of 11/04/2021 - Review Complete 11/04/2021  Allergen Reaction Noted   Bee venom Swelling and Anaphylaxis 07/31/2015   Other Other (See Comments) 11/28/2015   Tobramycin ophthalmic [tobramycin]  06/12/2020    Family History  Problem Relation Age of Onset   Macular degeneration Mother    Dementia Mother    Alcoholism Father    Prostate cancer Father    Diabetes Maternal Aunt    Diabetes Maternal Uncle    Prostate cancer Maternal Uncle    Mental illness Sister    Pancreatic cancer Neg Hx    Breast cancer Neg Hx    Colon cancer Neg Hx     Social History   Socioeconomic History   Marital status: Single  Spouse name: Not on file   Number of children: 0   Years of education: Not on file   Highest education level: Not on file  Occupational History   Not on file  Tobacco Use   Smoking status: Some Days    Packs/day: 0.50    Years: 30.00    Total pack years: 15.00    Types: Cigarettes    Last attempt to quit: 11/22/2016    Years since quitting: 4.9   Smokeless tobacco: Never   Tobacco comments:    smoke social occ  Vaping Use   Vaping Use: Never used  Substance and Sexual Activity   Alcohol use: No    Alcohol/week: 0.0 standard drinks of alcohol    Comment: former abuse- quit 09/2011   Drug use: No   Sexual activity: Not Currently  Other Topics Concern   Not on file  Social History Narrative   Divorced.   No children.   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    Has been smoking off/on since age 65, quit smoking 11/2016.    Exercises minimally   Eats all food groups.   Wears seatbelt.   Mother lives with him, has dementia.   Attends AA meetings.    Social Determinants of  Health   Financial Resource Strain: Not on file  Food Insecurity: Not on file  Transportation Needs: Not on file  Physical Activity: Not on file  Stress: Not on file  Social Connections: Not on file  Intimate Partner Violence: Not At Risk (06/27/2020)   Humiliation, Afraid, Rape, and Kick questionnaire    Fear of Current or Ex-Partner: No    Emotionally Abused: No    Physically Abused: No    Sexually Abused: No    Review of Systems: See HPI, otherwise negative ROS  Physical Exam: BP 120/80 (BP Location: Right Arm, Patient Position: Sitting, Cuff Size: Normal)   Pulse 88   Temp 97.8 F (36.6 C) (Temporal)   Ht '5\' 9"'$  (1.753 m)   Wt 176 lb 12.8 oz (80.2 kg)   SpO2 97%   BMI 26.11 kg/m  General:   Alert,  pleasant and cooperative in NAD  Impression/Plan:  ***     Notice: This dictation was prepared with Dragon dictation along with smaller phrase technology. Any transcriptional errors that result from this process are unintentional and may not be corrected upon review.

## 2021-11-05 ENCOUNTER — Encounter: Payer: Self-pay | Admitting: *Deleted

## 2021-11-05 ENCOUNTER — Telehealth: Payer: Self-pay | Admitting: *Deleted

## 2021-11-05 MED ORDER — PEG 3350-KCL-NA BICARB-NACL 420 G PO SOLR: ORAL | 0 refills | Status: DC

## 2021-11-05 NOTE — Telephone Encounter (Signed)
Called pt. He has been scheduled for FS 7/14 at 2:45pm. Aware will need to do a colon prep for procedure and 1 tap water enema morning of procedure per Dr. Gala Romney. Advised will mail instructions with pre-op appt. Rx for prep sent to pharmacy

## 2021-11-10 DIAGNOSIS — C61 Malignant neoplasm of prostate: Secondary | ICD-10-CM | POA: Diagnosis not present

## 2021-11-10 DIAGNOSIS — R3915 Urgency of urination: Secondary | ICD-10-CM | POA: Diagnosis not present

## 2021-11-10 DIAGNOSIS — R3912 Poor urinary stream: Secondary | ICD-10-CM | POA: Diagnosis not present

## 2021-11-11 DIAGNOSIS — M9902 Segmental and somatic dysfunction of thoracic region: Secondary | ICD-10-CM | POA: Diagnosis not present

## 2021-11-11 DIAGNOSIS — S134XXA Sprain of ligaments of cervical spine, initial encounter: Secondary | ICD-10-CM | POA: Diagnosis not present

## 2021-11-11 DIAGNOSIS — M9901 Segmental and somatic dysfunction of cervical region: Secondary | ICD-10-CM | POA: Diagnosis not present

## 2021-11-11 DIAGNOSIS — S233XXA Sprain of ligaments of thoracic spine, initial encounter: Secondary | ICD-10-CM | POA: Diagnosis not present

## 2021-11-11 DIAGNOSIS — M9903 Segmental and somatic dysfunction of lumbar region: Secondary | ICD-10-CM | POA: Diagnosis not present

## 2021-11-11 DIAGNOSIS — S338XXA Sprain of other parts of lumbar spine and pelvis, initial encounter: Secondary | ICD-10-CM | POA: Diagnosis not present

## 2021-11-17 DIAGNOSIS — H524 Presbyopia: Secondary | ICD-10-CM | POA: Diagnosis not present

## 2021-11-17 DIAGNOSIS — H2511 Age-related nuclear cataract, right eye: Secondary | ICD-10-CM | POA: Diagnosis not present

## 2021-11-18 DIAGNOSIS — M6289 Other specified disorders of muscle: Secondary | ICD-10-CM | POA: Diagnosis not present

## 2021-11-18 DIAGNOSIS — N3943 Post-void dribbling: Secondary | ICD-10-CM | POA: Diagnosis not present

## 2021-11-18 DIAGNOSIS — N3941 Urge incontinence: Secondary | ICD-10-CM | POA: Diagnosis not present

## 2021-11-18 DIAGNOSIS — M62838 Other muscle spasm: Secondary | ICD-10-CM | POA: Diagnosis not present

## 2021-11-18 DIAGNOSIS — M6281 Muscle weakness (generalized): Secondary | ICD-10-CM | POA: Diagnosis not present

## 2021-11-18 DIAGNOSIS — R159 Full incontinence of feces: Secondary | ICD-10-CM | POA: Diagnosis not present

## 2021-11-18 DIAGNOSIS — K5900 Constipation, unspecified: Secondary | ICD-10-CM | POA: Diagnosis not present

## 2021-11-21 ENCOUNTER — Ambulatory Visit (HOSPITAL_COMMUNITY)
Admission: RE | Admit: 2021-11-21 | Discharge: 2021-11-21 | Disposition: A | Discharge status: Home / Self Care | Payer: Medicare Other | Source: Ambulatory Visit | Attending: Family Medicine | Admitting: Family Medicine

## 2021-11-21 ENCOUNTER — Other Ambulatory Visit (HOSPITAL_COMMUNITY): Payer: Self-pay | Admitting: Family Medicine

## 2021-11-21 DIAGNOSIS — Z8701 Personal history of pneumonia (recurrent): Secondary | ICD-10-CM | POA: Insufficient documentation

## 2021-11-21 DIAGNOSIS — R059 Cough, unspecified: Secondary | ICD-10-CM | POA: Diagnosis not present

## 2021-11-21 DIAGNOSIS — E875 Hyperkalemia: Secondary | ICD-10-CM | POA: Diagnosis not present

## 2021-11-24 DIAGNOSIS — N3943 Post-void dribbling: Secondary | ICD-10-CM | POA: Diagnosis not present

## 2021-11-24 DIAGNOSIS — K5900 Constipation, unspecified: Secondary | ICD-10-CM | POA: Diagnosis not present

## 2021-11-24 DIAGNOSIS — M62838 Other muscle spasm: Secondary | ICD-10-CM | POA: Diagnosis not present

## 2021-11-24 DIAGNOSIS — R3915 Urgency of urination: Secondary | ICD-10-CM | POA: Diagnosis not present

## 2021-11-24 DIAGNOSIS — M6281 Muscle weakness (generalized): Secondary | ICD-10-CM | POA: Diagnosis not present

## 2021-11-24 DIAGNOSIS — M6289 Other specified disorders of muscle: Secondary | ICD-10-CM | POA: Diagnosis not present

## 2021-11-24 DIAGNOSIS — R159 Full incontinence of feces: Secondary | ICD-10-CM | POA: Diagnosis not present

## 2021-12-01 NOTE — Patient Instructions (Signed)
April Carlyon  12/01/2021     '@PREFPERIOPPHARMACY'$ @   Your procedure is scheduled on  12/05/2021.   Report to [name] at  1235  P.M.   Call this number if you have problems the morning of surgery:  [phone]   Remember:  Follow the diet and prep instructions given to you by the office.    Take these medicines the morning of surgery with A SIP OF WATER                           zyrtec, dexilant, cymbalta.     Do not wear jewelry, make-up or nail polish.  Do not wear lotions, powders, or perfumes, or deodorant.  Do not shave 48 hours prior to surgery.  Men may shave face and neck.  Do not bring valuables to the hospital.  [facility] is not responsible for any belongings or valuables.  Contacts, dentures or bridgework may not be worn into surgery.  Leave your suitcase in the car.  After surgery it may be brought to your room.  For patients admitted to the hospital, discharge time will be determined by your treatment team.  Patients discharged the day of surgery will not be allowed to drive home and must have someone with them for 24 hours.    Special instructions:   DO NOT smoke tobacco or vape for 24 hours before your procedure.  Please read over the following fact sheets that you were given. Anesthesia Post-op Instructions and Care and Recovery After Surgery      Flexible Sigmoidoscopy, Care After This sheet gives you information about how to care for yourself after your procedure. Your health care provider may also give you more specific instructions. If you have problems or questions, contact your health care provider. What can I expect after the procedure? After the procedure, it is common to have: Cramping or pain in your abdomen. Bloating. A small amount of blood with your bowel movements. This may happen if a sample of tissue was removed for testing (biopsy). Follow these instructions at home: Eating and drinking  Drink enough fluid to keep  your urine pale yellow. Follow instructions from your health care provider about eating or drinking restrictions. Resume your normal diet as instructed by your health care provider. Avoid heavy or fried foods that are hard to digest. Activity  If you were given a medicine to help you relax (sedative) during the procedure, it can affect you for several hours. Do not drive or operate machinery until your health care provider says that it is safe. Rest as told by your health care provider. Return to your normal activities as told by your health care provider. Ask your health care provider what activities are safe for you. General instructions Take over-the-counter and prescription medicines only as told by your health care provider. Try walking around when you have cramps or feel bloated. Keep all follow-up visits as told by your health care provider. This is important. Contact a health care provider if: You have pain or cramping in your abdomen that gets worse or is not helped with medicine. You have a small amount of bleeding from your rectum that continues after 24 hours. You have nausea or vomiting. You feel weak or dizzy. You develop a fever. Get help right away if: You pass large blood clots or see a large amount of blood in the toilet after having a bowel  movement. You have severe pain in your abdomen. You have nausea or vomiting for more than 24 hours after the procedure. Summary After the procedure, you may have cramping or pain in your abdomen or you may have bloating. If you had a sample of tissue removed (biopsy), you may have a small amount of blood with your bowel movements. Resume your normal diet as instructed by your health care provider. Avoid heavy or fried foods that are hard to digest. Try walking around when you have cramps or feel bloated. Get help right away if you pass large blood clots or see a large amount of blood in the toilet after having a bowel movement. This  information is not intended to replace advice given to you by your health care provider. Make sure you discuss any questions you have with your health care provider. Document Revised: 05/08/2019 Document Reviewed: 05/08/2019 Elsevier Patient Education  [place] After This sheet gives you information about how to care for yourself after your procedure. Your health care provider may also give you more specific instructions. If you have problems or questions, contact your health care provider. What can I expect after the procedure? After the procedure, it is common to have: Tiredness. Forgetfulness about what happened after the procedure. Impaired judgment for important decisions. Nausea or vomiting. Some difficulty with balance. Follow these instructions at home: For the time period you were told by your health care provider:     Rest as needed. Do not participate in activities where you could fall or become injured. Do not drive or use machinery. Do not drink alcohol. Do not take sleeping pills or medicines that cause drowsiness. Do not make important decisions or sign legal documents. Do not take care of children on your own. Eating and drinking Follow the diet that is recommended by your health care provider. Drink enough fluid to keep your urine pale yellow. If you vomit: Drink water, juice, or soup when you can drink without vomiting. Make sure you have little or no nausea before eating solid foods. General instructions Have a responsible adult stay with you for the time you are told. It is important to have someone help care for you until you are awake and alert. Take over-the-counter and prescription medicines only as told by your health care provider. If you have sleep apnea, surgery and certain medicines can increase your risk for breathing problems. Follow instructions from your health care provider about wearing your sleep  device: Anytime you are sleeping, including during daytime naps. While taking prescription pain medicines, sleeping medicines, or medicines that make you drowsy. Avoid smoking. Keep all follow-up visits as told by your health care provider. This is important. Contact a health care provider if: You keep feeling nauseous or you keep vomiting. You feel light-headed. You are still sleepy or having trouble with balance after 24 hours. You develop a rash. You have a fever. You have redness or swelling around the IV site. Get help right away if: You have trouble breathing. You have new-onset confusion at home. Summary For several hours after your procedure, you may feel tired. You may also be forgetful and have poor judgment. Have a responsible adult stay with you for the time you are told. It is important to have someone help care for you until you are awake and alert. Rest as told. Do not drive or operate machinery. Do not drink alcohol or take sleeping pills. Get help right away if  you have trouble breathing, or if you suddenly become confused. This information is not intended to replace advice given to you by your health care provider. Make sure you discuss any questions you have with your health care provider. Document Revised: 04/15/2021 Document Reviewed: 04/13/2019 Elsevier Patient Education  [place].

## 2021-12-02 ENCOUNTER — Encounter (HOSPITAL_COMMUNITY): Payer: Self-pay

## 2021-12-02 ENCOUNTER — Encounter (HOSPITAL_COMMUNITY)
Admission: RE | Admit: 2021-12-02 | Discharge: 2021-12-02 | Disposition: A | Discharge status: Home / Self Care | Payer: Medicare Other | Source: Ambulatory Visit | Attending: Internal Medicine | Admitting: Internal Medicine

## 2021-12-02 VITALS — HR 96 | Temp 97.8°F | Resp 18 | Ht 68.0 in | Wt 175.0 lb

## 2021-12-02 DIAGNOSIS — D508 Other iron deficiency anemias: Secondary | ICD-10-CM

## 2021-12-02 DIAGNOSIS — Z01818 Encounter for other preprocedural examination: Secondary | ICD-10-CM | POA: Insufficient documentation

## 2021-12-02 DIAGNOSIS — K7460 Unspecified cirrhosis of liver: Secondary | ICD-10-CM | POA: Insufficient documentation

## 2021-12-02 DIAGNOSIS — I10 Essential (primary) hypertension: Secondary | ICD-10-CM | POA: Diagnosis not present

## 2021-12-02 HISTORY — DX: Anxiety disorder, unspecified: F41.9

## 2021-12-02 HISTORY — DX: Depression, unspecified: F32.A

## 2021-12-02 LAB — COMPREHENSIVE METABOLIC PANEL
ALT: 21 U/L (ref 0–44)
AST: 19 U/L (ref 15–41)
Albumin: 4 g/dL (ref 3.5–5.0)
Alkaline Phosphatase: 77 U/L (ref 38–126)
Anion gap: 8 (ref 5–15)
BUN: 14 mg/dL (ref 8–23)
CO2: 20 mmol/L — ABNORMAL LOW (ref 22–32)
Calcium: 9.1 mg/dL (ref 8.9–10.3)
Chloride: 104 mmol/L (ref 98–111)
Creatinine, Ser: 0.79 mg/dL (ref 0.61–1.24)
GFR, Estimated: >60 mL/min (ref >60)
Glucose, Bld: 119 mg/dL — ABNORMAL HIGH (ref 70–99)
Potassium: 4.4 mmol/L (ref 3.5–5.1)
Sodium: 132 mmol/L — ABNORMAL LOW (ref 135–145)
Total Bilirubin: 0.6 mg/dL (ref 0.3–1.2)
Total Protein: 7.2 g/dL (ref 6.5–8.1)

## 2021-12-02 LAB — CBC WITH DIFFERENTIAL/PLATELET
Abs Immature Granulocytes: 0.04 10*3/uL (ref 0.00–0.07)
Basophils Absolute: 0.1 10*3/uL (ref 0.0–0.1)
Basophils Relative: 1 %
Eosinophils Absolute: 0.1 10*3/uL (ref 0.0–0.5)
Eosinophils Relative: 2 %
HCT: 37.7 % — ABNORMAL LOW (ref 39.0–52.0)
Hemoglobin: 11.8 g/dL — ABNORMAL LOW (ref 13.0–17.0)
Immature Granulocytes: 1 %
Lymphocytes Relative: 39 %
Lymphs Abs: 3.2 10*3/uL (ref 0.7–4.0)
MCH: 25.8 pg — ABNORMAL LOW (ref 26.0–34.0)
MCHC: 31.3 g/dL (ref 30.0–36.0)
MCV: 82.3 fL (ref 80.0–100.0)
Monocytes Absolute: 0.8 10*3/uL (ref 0.1–1.0)
Monocytes Relative: 10 %
Neutro Abs: 3.9 10*3/uL (ref 1.7–7.7)
Neutrophils Relative %: 47 %
Platelets: 241 10*3/uL (ref 150–400)
RBC: 4.58 MIL/uL (ref 4.22–5.81)
RDW: 18.5 % — ABNORMAL HIGH (ref 11.5–15.5)
WBC: 8.2 10*3/uL (ref 4.0–10.5)
nRBC: 0 % (ref 0.0–0.2)

## 2021-12-02 LAB — PROTIME-INR
INR: 0.9 (ref 0.8–1.2)
Prothrombin Time: 12.3 seconds (ref 11.4–15.2)

## 2021-12-03 DIAGNOSIS — R159 Full incontinence of feces: Secondary | ICD-10-CM | POA: Diagnosis not present

## 2021-12-03 DIAGNOSIS — M62838 Other muscle spasm: Secondary | ICD-10-CM | POA: Diagnosis not present

## 2021-12-03 DIAGNOSIS — M6281 Muscle weakness (generalized): Secondary | ICD-10-CM | POA: Diagnosis not present

## 2021-12-03 DIAGNOSIS — M6289 Other specified disorders of muscle: Secondary | ICD-10-CM | POA: Diagnosis not present

## 2021-12-03 DIAGNOSIS — K5900 Constipation, unspecified: Secondary | ICD-10-CM | POA: Diagnosis not present

## 2021-12-03 DIAGNOSIS — N3943 Post-void dribbling: Secondary | ICD-10-CM | POA: Diagnosis not present

## 2021-12-03 DIAGNOSIS — N3941 Urge incontinence: Secondary | ICD-10-CM | POA: Diagnosis not present

## 2021-12-05 ENCOUNTER — Ambulatory Visit (HOSPITAL_COMMUNITY)
Admission: RE | Admit: 2021-12-05 | Discharge: 2021-12-05 | Disposition: A | Discharge status: Home / Self Care | Payer: Medicare Other | Source: Ambulatory Visit | Attending: Internal Medicine | Admitting: Internal Medicine

## 2021-12-05 ENCOUNTER — Ambulatory Visit (HOSPITAL_BASED_OUTPATIENT_CLINIC_OR_DEPARTMENT_OTHER): Payer: Medicare Other | Admitting: Anesthesiology

## 2021-12-05 ENCOUNTER — Ambulatory Visit (HOSPITAL_COMMUNITY): Payer: Medicare Other | Admitting: Anesthesiology

## 2021-12-05 ENCOUNTER — Encounter (HOSPITAL_COMMUNITY)
Admission: RE | Disposition: A | Discharge status: Home / Self Care | Payer: Self-pay | Source: Ambulatory Visit | Attending: Internal Medicine

## 2021-12-05 ENCOUNTER — Encounter (HOSPITAL_COMMUNITY): Payer: Self-pay | Admitting: Internal Medicine

## 2021-12-05 DIAGNOSIS — F17210 Nicotine dependence, cigarettes, uncomplicated: Secondary | ICD-10-CM | POA: Insufficient documentation

## 2021-12-05 DIAGNOSIS — K625 Hemorrhage of anus and rectum: Secondary | ICD-10-CM

## 2021-12-05 DIAGNOSIS — K51411 Inflammatory polyps of colon with rectal bleeding: Secondary | ICD-10-CM | POA: Diagnosis not present

## 2021-12-05 DIAGNOSIS — Z923 Personal history of irradiation: Secondary | ICD-10-CM | POA: Diagnosis not present

## 2021-12-05 DIAGNOSIS — F418 Other specified anxiety disorders: Secondary | ICD-10-CM | POA: Diagnosis not present

## 2021-12-05 DIAGNOSIS — D128 Benign neoplasm of rectum: Secondary | ICD-10-CM | POA: Diagnosis not present

## 2021-12-05 DIAGNOSIS — K5790 Diverticulosis of intestine, part unspecified, without perforation or abscess without bleeding: Secondary | ICD-10-CM | POA: Insufficient documentation

## 2021-12-05 DIAGNOSIS — I10 Essential (primary) hypertension: Secondary | ICD-10-CM | POA: Insufficient documentation

## 2021-12-05 DIAGNOSIS — Z85818 Personal history of malignant neoplasm of other sites of lip, oral cavity, and pharynx: Secondary | ICD-10-CM | POA: Diagnosis not present

## 2021-12-05 DIAGNOSIS — Z980 Intestinal bypass and anastomosis status: Secondary | ICD-10-CM | POA: Diagnosis not present

## 2021-12-05 DIAGNOSIS — Z8546 Personal history of malignant neoplasm of prostate: Secondary | ICD-10-CM | POA: Insufficient documentation

## 2021-12-05 HISTORY — PX: FLEXIBLE SIGMOIDOSCOPY: SHX5431

## 2021-12-05 HISTORY — PX: BIOPSY: SHX5522

## 2021-12-05 HISTORY — PX: HOT HEMOSTASIS: SHX5433

## 2021-12-05 SURGERY — SIGMOIDOSCOPY, FLEXIBLE
Anesthesia: General

## 2021-12-05 MED ORDER — PROPOFOL 500 MG/50ML IV EMUL
INTRAVENOUS | Status: AC
  Filled 2021-12-05: qty 100

## 2021-12-05 MED ORDER — LACTATED RINGERS IV SOLN: INTRAVENOUS | Status: DC

## 2021-12-05 MED ORDER — LIDOCAINE HCL URETHRAL/MUCOSAL 2 % EX GEL
CUTANEOUS | Status: DC | PRN
  Administered 2021-12-05: 50 via TOPICAL

## 2021-12-05 MED ORDER — PROPOFOL 500 MG/50ML IV EMUL
INTRAVENOUS | Status: DC | PRN
  Administered 2021-12-05: 150 ug/kg/min via INTRAVENOUS

## 2021-12-05 NOTE — Anesthesia Postprocedure Evaluation (Signed)
Anesthesia Post Note  Patient: Frank Castillo  Procedure(s) Performed: FLEXIBLE SIGMOIDOSCOPY BIOPSY HOT HEMOSTASIS (ARGON PLASMA COAGULATION/BICAP)  Patient location during evaluation: PACU Anesthesia Type: General Level of consciousness: awake and alert Pain management: pain level controlled Vital Signs Assessment: post-procedure vital signs reviewed and stable Respiratory status: spontaneous breathing, nonlabored ventilation, respiratory function stable and patient connected to nasal cannula oxygen Cardiovascular status: blood pressure returned to baseline and stable Postop Assessment: no apparent nausea or vomiting Anesthetic complications: no   There were no known notable events for this encounter.   Last Vitals:  Vitals:   12/05/21 1255 12/05/21 1359  BP: (!) 153/93 113/69  Pulse: 85 82  Resp: 15 15  Temp: 36.9 C 36.6 C  SpO2: 100% 96%    Last Pain:  Vitals:   12/05/21 1359  TempSrc: Oral  PainSc: 0-No pain                 Trixie Rude

## 2021-12-05 NOTE — Anesthesia Preprocedure Evaluation (Addendum)
Anesthesia Evaluation  Patient identified by MRN, date of birth, ID band Patient awake    Reviewed: Allergy & Precautions, NPO status , Patient's Chart, lab work & pertinent test results  Airway Mallampati: II       Dental  (+)    Pulmonary neg pulmonary ROS, Current Smoker and Patient abstained from smoking.,    Pulmonary exam normal breath sounds clear to auscultation       Cardiovascular Exercise Tolerance: Good METS: 3 - Mets hypertension, negative cardio ROS Normal cardiovascular exam Rhythm:Regular Rate:Normal     Neuro/Psych PSYCHIATRIC DISORDERS Anxiety Depression  Neuromuscular disease (peroneal nerve palsey, right)    GI/Hepatic Neg liver ROS, GERD  ,  Endo/Other  negative endocrine ROS  Renal/GU negative Renal ROS   Prostate cancer    Musculoskeletal negative musculoskeletal ROS (+)   Abdominal Normal abdominal exam  (+)   Peds  Hematology  (+) Blood dyscrasia, anemia ,   Anesthesia Other Findings Tonsillar/mandibular cancer  Reproductive/Obstetrics                            Anesthesia Physical  Anesthesia Plan  ASA: 3  Anesthesia Plan: General   Post-op Pain Management:    Induction: Intravenous  PONV Risk Score and Plan: TIVA and Propofol infusion  Airway Management Planned: Nasal Cannula and Natural Airway  Additional Equipment:   Intra-op Plan:   Post-operative Plan:   Informed Consent: I have reviewed the patients History and Physical, chart, labs and discussed the procedure including the risks, benefits and alternatives for the proposed anesthesia with the patient or authorized representative who has indicated his/her understanding and acceptance.       Plan Discussed with: CRNA  Anesthesia Plan Comments:         Anesthesia Quick Evaluation

## 2021-12-05 NOTE — H&P (Signed)
$'@LOGO'I$ @   Primary Care Physician:  Caren Macadam, MD Primary Gastroenterologist:  Dr. Gala Romney  Pre-Procedure History & Physical: HPI:  Frank Castillo is a 71 y.o. male here for further evaluation of paper hematochezia painless.  History of radiation seed implant last year.  He is here for flexible sigmoidoscopy.  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   Wears contact lenses    in left eye    Past Surgical History:  Procedure Laterality Date   BIOPSY  07/02/2016   Procedure: BIOPSY;  Surgeon: Daneil Dolin, MD;  [facility];  Service: Endoscopy;;  esophageal   COLON SURGERY  2017   ruptured colon   COLONOSCOPY WITH PROPOFOL N/A 07/02/2016   Procedure: COLONOSCOPY WITH PROPOFOL;  Surgeon: Daneil Dolin, MD;  [facility];  Service: Endoscopy;  Laterality: N/A;  9:45am   COLOSTOMY TAKEDOWN N/A 09/02/2016   Procedure: LAPAROSCOPIC COLOSTOMY REVERSAL;  Surgeon: Leighton Ruff, MD;  [facility]: [facility];  Service: General;  Laterality: N/A;   ESOPHAGOGASTRODUODENOSCOPY (EGD) WITH PROPOFOL N/A 07/02/2016   Procedure: ESOPHAGOGASTRODUODENOSCOPY (EGD) WITH PROPOFOL;  Surgeon: Daneil Dolin, MD;  [facility];  Service: Endoscopy;  Laterality: N/A;   ESOPHAGOGASTRODUODENOSCOPY (EGD) WITH PROPOFOL N/A 12/28/2019   Procedure: ESOPHAGOGASTRODUODENOSCOPY  (EGD) WITH PROPOFOL;  Surgeon: Daneil Dolin, MD;  [facility];  Service: Endoscopy;  Laterality: N/A;  11:00am   HERNIA REPAIR Right 2019   [facility] [place]  09/02/2016   Procedure: HERNIA REPAIR INCISIONAL;  Surgeon: Leighton Ruff, MD;  [facility];  Service: General;;   LAPAROTOMY N/A 03/28/2016   Procedure: EXPLORATORY LAPAROTOMY, SIGMOID COLECTOMY, COLOSTOMY;  Surgeon: Leighton Ruff, MD;  [facility];  Service: General;  Laterality: N/A;   MALONEY DILATION N/A 07/02/2016   Procedure: Keturah Shavers;  Surgeon: Daneil Dolin, MD;  [facility];  Service: Endoscopy;  Laterality: N/A;   MOUTH SURGERY  2019   left lower jaw   PORT-A-CATH REMOVAL  09/02/2016   Procedure: REMOVAL PORT-A-CATH;  Surgeon: Leighton Ruff, MD;  [facility];  Service: General;;   PROSTATE BIOPSY  2021   RADIOACTIVE SEED IMPLANT N/A 06/28/2020   Procedure: RADIOACTIVE SEED IMPLANT/BRACHYTHERAPY IMPLANT WITH CYSTOSCOPY;  Surgeon: Janith Lima, MD;  Location[facility];  Service: Urology;  Laterality: N/A;   REMOVAL OF GASTROSTOMY TUBE N/A 09/02/2016   Procedure: REMOVAL OF GASTROSTOMY TUBE;  Surgeon: Leighton Ruff, MD;  [facility];  Service: General;  Laterality: N/A;   SPACE OAR INSTILLATION N/A 06/28/2020   Procedure: SPACE OAR INSTILLATION;  Surgeon: Janith Lima, MD;  Location: [facility];  Service: Urology;  Laterality: N/A;   TONSILLECTOMY Left 07/09/2015   Procedure: LEFT TONSILLECTOMY;  Surgeon: Leta Baptist, MD;  Location: [place];  Service: ENT;  Laterality: Left;   TONSILLECTOMY  2017   left tonsil   Z-PLASTY SCAR REVISION N/A 09/02/2016   Procedure: SCAR REVISION;  Surgeon: Leighton Ruff, MD;  [facility];  Service: General;  Laterality: N/A;    Prior to Admission medications   Medication Sig Start Date End Date Taking? Authorizing Provider  aspirin EC 81 MG tablet Take  1 tablet (81 mg total) by mouth daily. Patient taking differently: Take 81 mg by mouth 2 (two) times a week. 06/17/17  Yes Caren Macadam, MD  atorvastatin (LIPITOR) 40 MG tablet Take 1 tablet (40 mg total) by mouth daily. Patient taking differently: Take 40 mg by mouth at bedtime. 11/29/17  Yes Hagler, [name], MD  B Complex Vitamins (VITAMIN-B COMPLEX PO) Take 1 tablet by mouth daily.   Yes [provider]  cetirizine (ZYRTEC) 10 MG tablet Take 10 mg by mouth daily.   Yes [provider]  chlorhexidine (PERIDEX) 0.12 % solution Use as directed 15 mLs in the mouth or throat 2 (two) times daily as needed (prevention). 11/29/19  Yes [provider]  Cholecalciferol (VITAMIN D) 50 MCG (2000 UT) tablet Take 2,000 Units by mouth daily.   Yes [provider]  DEXILANT 60 MG capsule TAKE 1 CAPSULE BY MOUTH DAILY 08/18/21  Yes Kennard Fildes, Cristopher Estimable, MD  docusate sodium (COLACE) 250 MG capsule Take 250 mg by mouth 2 (two) times daily.   Yes [provider]  DULoxetine (CYMBALTA) 60 MG capsule Take 120 mg by mouth daily. 11/03/18  Yes [provider]  fluticasone (FLONASE) 50 MCG/ACT nasal spray Place 2 sprays into both nostrils daily as needed for allergies.   Yes [provider]  GEMTESA 75 MG TABS Take 75 mg by mouth daily. 06/28/21  Yes [provider]  lisinopril (ZESTRIL) 5 MG tablet Take 5 mg by mouth daily. 05/20/17  Yes [provider]  pilocarpine (SALAGEN) 5 MG tablet Take 5 mg by mouth in the morning, at noon, and at bedtime. 04/09/21  Yes [provider]  polyethylene glycol-electrolytes (NULYTELY) 420 g solution As directed 11/05/21  Yes Shyrl Obi, Cristopher Estimable, MD  sodium fluoride (FLUORISHIELD) 1.1 % GEL dental gel Instill one drop of gel per tooth space of fluoride tray. Place over teeth for 5 minutes. Remove. Spit out excess. Repeat nightly. Patient taking differently: Place 1 Application onto teeth at bedtime. Instill one  drop of gel per tooth space of fluoride tray. Place over teeth for 5 minutes. Remove. Spit out excess. Repeat nightly. 03/16/17  Yes Hagler, [name], MD  tamsulosin (FLOMAX) 0.4 MG CAPS capsule Take 0.8 mg by mouth at bedtime. 11/18/20  Yes [provider]  triamcinolone (KENALOG) 0.1 % paste Use as directed 1 Application in the mouth or throat daily as needed (ulcers). 10/04/20  Yes [provider]  triamcinolone cream (KENALOG) 0.1 % Apply 1 Application topically 2 (two) times daily as needed (eczema).   Yes [provider]  Turmeric 500 MG CAPS Take 500 mg by mouth in the morning and at bedtime.   Yes [provider]    Allergies as of 11/05/2021 - Review Complete 11/04/2021  Allergen Reaction Noted   Bee venom Swelling and Anaphylaxis 07/31/2015   Other Other (See Comments) 11/28/2015   Tobramycin ophthalmic [tobramycin]  06/12/2020  Family History  Problem Relation Age of Onset   Macular degeneration Mother    Dementia Mother    Alcoholism Father    Prostate cancer Father    Diabetes Maternal Aunt    Diabetes Maternal Uncle    Prostate cancer Maternal Uncle    Mental illness Sister    Pancreatic cancer Neg Hx    Breast cancer Neg Hx    Colon cancer Neg Hx     Social History   Socioeconomic History   Marital status: Single    Spouse name: Not on file   Number of children: 0   Years of education: Not on file   Highest education level: Not on file  Occupational History   Not on file  Tobacco Use   Smoking status: Some Days    Packs/day: 0.50    Years: 30.00    Total pack years: 15.00    Types: Cigarettes    Last attempt to quit: 11/22/2016    Years since quitting: 5.0   Smokeless tobacco: Never   Tobacco comments:    smoke social occ  Vaping Use   Vaping Use: Never used  Substance and Sexual Activity   Alcohol use: No    Alcohol/week: 0.0 standard drinks of alcohol    Comment: former abuse- quit 09/2011   Drug use: No   Sexual  activity: Not Currently  Other Topics Concern   Not on file  Social History Narrative   Divorced.   No children.   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    Has been smoking off/on since age 37, quit smoking 11/2016.    Exercises minimally   Eats all food groups.   Wears seatbelt.   Mother lives with him, has dementia.   Attends AA meetings.    Social Determinants of Health   Financial Resource Strain: Not on file  Food Insecurity: Not on file  Transportation Needs: Not on file  Physical Activity: Not on file  Stress: Not on file  Social Connections: Not on file  Intimate Partner Violence: Not At Risk (06/27/2020)   Humiliation, Afraid, Rape, and Kick questionnaire    Fear of Current or Ex-Partner: No    Emotionally Abused: No    Physically Abused: No    Sexually Abused: No    Review of Systems: See HPI, otherwise negative ROS  Physical Exam: BP (!) 153/93   Pulse 85   Temp 98.5 F (36.9 C) (Oral)   Resp 15   Ht '5\' 9"'$  (1.753 m)   Wt 79.4 kg   SpO2 100%   BMI 25.84 kg/m  General:   Alert,  Well-developed, well-nourished, pleasant and cooperative in NAD ervical adenopathy. Lungs:  Clear throughout to auscultation.   No wheezes, crackles, or rhonchi. No acute distress. Heart:  Regular rate and rhythm; no murmurs, clicks, rubs,  or gallops. Abdomen: Non-distended, normal bowel sounds.  Soft and nontender without appreciable mass or hepatosplenomegaly.  Pulses:  Normal pulses noted. Extremities:  Without clubbing or edema.  Impression/Plan: Painless low-volume rectal bleeding in a 71 year old gentleman status post radiation seed implant a year ago for prostate cancer.  Colonoscopy not too long ago as chronicled in the medical record.  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  Although, other entities remain in the differential.    Recommendations:  I  have offered the patient a flexible sigmoidoscopy  today with therapeutic intent as appropriate.  If he has radiation proctitis we talked about the treatment options and the risk.  Potential for needing more than 1 session as appropriate reviewed.  Questions answered.  He is agreeable.     Notice: This dictation was prepared with Dragon dictation along with smaller phrase technology. Any transcriptional errors that result from this process are unintentional and may not be corrected upon review.

## 2021-12-05 NOTE — Transfer of Care (Signed)
Immediate Anesthesia Transfer of Care Note  Patient: Frank Castillo  Procedure(s) Performed: FLEXIBLE SIGMOIDOSCOPY BIOPSY HOT HEMOSTASIS (ARGON PLASMA COAGULATION/BICAP)  Patient Location: PACU and Short Stay  Anesthesia Type:General  Level of Consciousness: awake  Airway & Oxygen Therapy: Patient Spontanous Breathing  Post-op Assessment: Report given to RN  Post vital signs: Reviewed  Last Vitals:  Vitals Value Taken Time  BP    Temp 36.6 C 12/05/21 1359  Pulse 82 12/05/21 1359  Resp 15 12/05/21 1359  SpO2 96 % 12/05/21 1359    Last Pain:  Vitals:   12/05/21 1359  TempSrc: Oral  PainSc:          Complications: There were no known notable events for this encounter.

## 2021-12-05 NOTE — Discharge Instructions (Signed)
  [facility] Discharge Instructions  Read the instructions outlined below and refer to this sheet in the next few weeks. These discharge instructions provide you with general information on caring for yourself after you leave the hospital. Your doctor may also give you specific instructions. While your treatment has been planned according to the most current medical practices available, unavoidable complications occasionally occur. If you have any problems or questions after discharge, call Dr. Gala Romney at [phone]. ACTIVITY You may resume your regular activity, but move at a slower pace for the next 24 hours.  Take frequent rest periods for the next 24 hours.  Walking will help get rid of the air and reduce the bloated feeling in your belly (abdomen).  No driving for 24 hours (because of the medicine (anesthesia) used during the test).   Do not sign any important legal documents or operate any machinery for 24 hours (because of the anesthesia used during the test).  NUTRITION Drink plenty of fluids.  You may resume your normal diet as instructed by your doctor.  Begin with a light meal and progress to your normal diet. Heavy or fried foods are harder to digest and may make you feel sick to your stomach (nauseated).  Avoid alcoholic beverages for 24 hours or as instructed.  MEDICATIONS You may resume your normal medications unless your doctor tells you otherwise.  WHAT YOU CAN EXPECT TODAY Some feelings of bloating in the abdomen.  Passage of more gas than usual.  Spotting of blood in your stool or on the toilet paper.  IF YOU HAD POLYPS REMOVED DURING THE COLONOSCOPY: No aspirin products for 7 days or as instructed.  No alcohol for 7 days or as instructed.  Eat a soft diet for the next 24 hours.  FINDING OUT THE RESULTS OF YOUR TEST Not all test results are available during your visit. If your test results are not back during the visit, make an appointment with your caregiver to find out  the results. Do not assume everything is normal if you have not heard from your caregiver or the medical facility. It is important for you to follow up on all of your test results.  SEEK IMMEDIATE MEDICAL ATTENTION IF: You have more than a spotting of blood in your stool.  Your belly is swollen (abdominal distention).  You are nauseated or vomiting.  You have a temperature over 101.  You have abdominal pain or discomfort that is severe or gets worse throughout the day.    You had a small nodule in your esophagus-likely related to scar tissue from your prior surgery.  It was biopsied.  Also he had subtle changes of radiation proctitis.  Those areas were treated as anticipated.  Use a stool softener such as Colace 100 mg twice daily for the next 5 days.  Avoid straining.  Further recommendations to follow pending review of pathology report  At patient request, I called Anitra Lauth at [phone] rolled to voicemail.  Left a detailed message.

## 2021-12-05 NOTE — Op Note (Signed)
[facility] Patient Name: Frank Castillo Procedure Date: 12/05/2021 1:29 PM MRN: [id number] Date of Birth: 11/03/1950 Attending MD: Norvel Richards , MD CSN: [id number] Age: 71 Admit Type: Outpatient Procedure:                Flexible Sigmoidoscopy Indications:              Rectal hemorrhage Providers:                Norvel Richards, MD, Caprice Kluver, Raphael Gibney, Technician, Charlsie Quest Theda Sers RN, RN Referring MD:              Medicines:                Propofol per Anesthesia Complications:            No immediate complications. Estimated Blood Loss:     Estimated blood loss was minimal. Procedure:                Pre-Anesthesia Assessment:                           - Prior to the procedure, a History and Physical                            was performed, and patient medications and                            allergies were reviewed. The patient's tolerance of                            previous anesthesia was also reviewed. The risks                            and benefits of the procedure and the sedation                            options and risks were discussed with the patient.                            All questions were answered, and informed consent                            was obtained. Prior Anticoagulants: The patient has                            taken no previous anticoagulant or antiplatelet                            agents. ASA Grade Assessment: III - A patient with                            severe systemic disease. After reviewing the risks  and benefits, the patient was deemed in                            satisfactory condition to undergo the procedure.                           After obtaining informed consent, the scope was                            passed under direct vision. The [id number]                            scope was introduced through the anus and advanced                             to the 40 cm from the anal verge. The flexible                            sigmoidoscopy was accomplished without difficulty.                            The patient tolerated the procedure well. The                            quality of the bowel preparation was adequate. Scope In: 1:42:54 PM Scope Out: 1:54:18 PM Total Procedure Duration: 0 hours 11 minutes 24 seconds  Findings:      Digital rectal examination was negative. Endoscopic findings prep was       adequate. Just inside the anal verge there was a 8 mm inflammatory       appearing polypoid lesion - did not appear clearly neoplastic or       adenomatous. In the surrounding area, there were some subtle neovascular       changes consistent with a radiation proctitis -with the friability and a       slight amount of oozing. Surgical anastomosis at 5 cm. Appeared normal.       Scope was advanced a nice one-to-one fashion at 45 cm. The only other       abnormality seen were scattered distal colonic diverticula. Attention       was turned to the distal rectum. The nodule seen in the distal rectum       was biopsied. Subsequently, sentinel areas of proctitis were treated       conservatively with spot application of APC circular probe 20 J each.       Good hemostasis achieved/maintained. Impression:               - Rectal nodule biopsy. Changes consistent with                            mild radiation-induced proctitis. Status post APC                            treatment. Surgical anastomosis at 5 cm. Distal  diverticulosis. Moderate Sedation:      Moderate (conscious) sedation was personally administered by an       anesthesia professional. The following parameters were monitored: oxygen       saturation, heart rate, blood pressure, respiratory rate, EKG, adequacy       of pulmonary ventilation, and response to care. Recommendation:           Colace 100 mg twice daily for the next 5 days.                             Avoid straining. Follow-up on pathology. Further                            recommendations to follow. Procedure Code(s):        --- Professional ---                           [phone], Sigmoidoscopy, flexible; diagnostic,                            including collection of specimen(s) by brushing or                            washing, when performed (separate procedure) Diagnosis Code(s):        --- Professional ---                           K62.5, Hemorrhage of anus and rectum CPT copyright 2019 [employer]. All rights reserved. The codes documented in this report are preliminary and upon coder review may  be revised to meet current compliance requirements. Cristopher Estimable. Lenin Kuhnle, MD Norvel Richards, MD 12/05/2021 2:12:31 PM This report has been signed electronically. Number of Addenda: 0

## 2021-12-08 ENCOUNTER — Encounter: Payer: Self-pay | Admitting: Internal Medicine

## 2021-12-10 ENCOUNTER — Other Ambulatory Visit: Payer: Self-pay

## 2021-12-18 ENCOUNTER — Telehealth: Payer: Self-pay | Admitting: Internal Medicine

## 2021-12-18 NOTE — Telephone Encounter (Signed)
I do not see any results in this patient's chart for his colonoscopy that was done on 12/05/21.

## 2021-12-18 NOTE — Telephone Encounter (Signed)
Pt received letter that it was time to schedule his EGD and is aware of his OV date with [facility]. He also said he had a colonoscopy on 12/05/2021 and was asking if the results were back. Please call [phone]

## 2021-12-19 ENCOUNTER — Encounter (HOSPITAL_COMMUNITY): Payer: Self-pay | Admitting: Internal Medicine

## 2021-12-19 LAB — SURGICAL PATHOLOGY

## 2021-12-22 ENCOUNTER — Other Ambulatory Visit: Payer: Self-pay

## 2021-12-22 DIAGNOSIS — B259 Cytomegaloviral disease, unspecified: Secondary | ICD-10-CM

## 2021-12-22 DIAGNOSIS — K921 Melena: Secondary | ICD-10-CM

## 2021-12-23 ENCOUNTER — Other Ambulatory Visit (HOSPITAL_COMMUNITY)
Admission: RE | Admit: 2021-12-23 | Discharge: 2021-12-23 | Disposition: A | Discharge status: Home / Self Care | Payer: Medicare Other | Source: Ambulatory Visit | Attending: Internal Medicine | Admitting: Internal Medicine

## 2021-12-23 DIAGNOSIS — B259 Cytomegaloviral disease, unspecified: Secondary | ICD-10-CM | POA: Diagnosis not present

## 2021-12-23 DIAGNOSIS — K921 Melena: Secondary | ICD-10-CM | POA: Diagnosis not present

## 2021-12-24 LAB — CMV IGM: CMV IgM: <30 AU/mL (ref 0.0–29.9)

## 2021-12-24 LAB — CMV ANTIBODY, IGG (EIA): CMV Ab - IgG: >10 U/mL — ABNORMAL HIGH (ref 0.00–0.59)

## 2021-12-25 ENCOUNTER — Ambulatory Visit (HOSPITAL_COMMUNITY)
Admission: RE | Admit: 2021-12-25 | Discharge: 2021-12-25 | Disposition: A | Discharge status: Home / Self Care | Payer: Medicare Other | Source: Ambulatory Visit | Attending: Hematology | Admitting: Hematology

## 2021-12-25 ENCOUNTER — Inpatient Hospital Stay: Payer: Medicare Other | Attending: Hematology

## 2021-12-25 ENCOUNTER — Ambulatory Visit (HOSPITAL_COMMUNITY)
Admission: RE | Admit: 2021-12-25 | Discharge: 2021-12-25 | Disposition: A | Discharge status: Home / Self Care | Payer: Medicare Other | Source: Ambulatory Visit | Attending: Hematology and Oncology | Admitting: Hematology and Oncology

## 2021-12-25 DIAGNOSIS — Z87891 Personal history of nicotine dependence: Secondary | ICD-10-CM | POA: Insufficient documentation

## 2021-12-25 DIAGNOSIS — C099 Malignant neoplasm of tonsil, unspecified: Secondary | ICD-10-CM | POA: Insufficient documentation

## 2021-12-25 DIAGNOSIS — E039 Hypothyroidism, unspecified: Secondary | ICD-10-CM

## 2021-12-25 DIAGNOSIS — F17210 Nicotine dependence, cigarettes, uncomplicated: Secondary | ICD-10-CM | POA: Diagnosis not present

## 2021-12-25 DIAGNOSIS — Z122 Encounter for screening for malignant neoplasm of respiratory organs: Secondary | ICD-10-CM | POA: Insufficient documentation

## 2021-12-25 DIAGNOSIS — I672 Cerebral atherosclerosis: Secondary | ICD-10-CM | POA: Diagnosis not present

## 2021-12-25 DIAGNOSIS — K110 Atrophy of salivary gland: Secondary | ICD-10-CM | POA: Diagnosis not present

## 2021-12-25 DIAGNOSIS — I6522 Occlusion and stenosis of left carotid artery: Secondary | ICD-10-CM | POA: Diagnosis not present

## 2021-12-25 LAB — CBC WITH DIFFERENTIAL/PLATELET
Abs Immature Granulocytes: 0.01 10*3/uL (ref 0.00–0.07)
Basophils Absolute: 0.1 10*3/uL (ref 0.0–0.1)
Basophils Relative: 2 %
Eosinophils Absolute: 0.1 10*3/uL (ref 0.0–0.5)
Eosinophils Relative: 2 %
HCT: 36.8 % — ABNORMAL LOW (ref 39.0–52.0)
Hemoglobin: 11.7 g/dL — ABNORMAL LOW (ref 13.0–17.0)
Immature Granulocytes: 0 %
Lymphocytes Relative: 42 %
Lymphs Abs: 2.6 10*3/uL (ref 0.7–4.0)
MCH: 26.9 pg (ref 26.0–34.0)
MCHC: 31.8 g/dL (ref 30.0–36.0)
MCV: 84.6 fL (ref 80.0–100.0)
Monocytes Absolute: 0.7 10*3/uL (ref 0.1–1.0)
Monocytes Relative: 11 %
Neutro Abs: 2.7 10*3/uL (ref 1.7–7.7)
Neutrophils Relative %: 43 %
Platelets: 248 10*3/uL (ref 150–400)
RBC: 4.35 MIL/uL (ref 4.22–5.81)
RDW: 18.1 % — ABNORMAL HIGH (ref 11.5–15.5)
WBC: 6.2 10*3/uL (ref 4.0–10.5)
nRBC: 0 % (ref 0.0–0.2)

## 2021-12-25 LAB — COMPREHENSIVE METABOLIC PANEL
ALT: 17 U/L (ref 0–44)
AST: 19 U/L (ref 15–41)
Albumin: 4.2 g/dL (ref 3.5–5.0)
Alkaline Phosphatase: 55 U/L (ref 38–126)
Anion gap: 5 (ref 5–15)
BUN: 18 mg/dL (ref 8–23)
CO2: 27 mmol/L (ref 22–32)
Calcium: 8.9 mg/dL (ref 8.9–10.3)
Chloride: 102 mmol/L (ref 98–111)
Creatinine, Ser: 0.83 mg/dL (ref 0.61–1.24)
GFR, Estimated: >60 mL/min (ref >60)
Glucose, Bld: 127 mg/dL — ABNORMAL HIGH (ref 70–99)
Potassium: 4.2 mmol/L (ref 3.5–5.1)
Sodium: 134 mmol/L — ABNORMAL LOW (ref 135–145)
Total Bilirubin: 0.5 mg/dL (ref 0.3–1.2)
Total Protein: 7.3 g/dL (ref 6.5–8.1)

## 2021-12-25 LAB — TSH: TSH: 4.024 u[IU]/mL (ref 0.350–4.500)

## 2021-12-25 LAB — CMV DNA, QUANTITATIVE, PCR
CMV DNA Quant: NEGATIVE IU/mL
Log10 CMV Qn DNA Pl: UNDETERMINED log10 IU/mL

## 2021-12-25 MED ORDER — IOHEXOL 300 MG/ML  SOLN
75.0000 mL | Freq: Once | INTRAMUSCULAR | Status: AC | PRN
  Administered 2021-12-25: 75 mL via INTRAVENOUS

## 2022-01-01 ENCOUNTER — Inpatient Hospital Stay (HOSPITAL_BASED_OUTPATIENT_CLINIC_OR_DEPARTMENT_OTHER): Payer: Medicare Other | Admitting: Hematology

## 2022-01-01 VITALS — BP 108/72 | HR 114 | Temp 97.6°F | Resp 16 | Ht 69.0 in | Wt 176.2 lb

## 2022-01-01 DIAGNOSIS — Z122 Encounter for screening for malignant neoplasm of respiratory organs: Secondary | ICD-10-CM | POA: Diagnosis not present

## 2022-01-01 DIAGNOSIS — Z87891 Personal history of nicotine dependence: Secondary | ICD-10-CM | POA: Diagnosis not present

## 2022-01-01 DIAGNOSIS — C099 Malignant neoplasm of tonsil, unspecified: Secondary | ICD-10-CM

## 2022-01-01 DIAGNOSIS — D649 Anemia, unspecified: Secondary | ICD-10-CM | POA: Diagnosis not present

## 2022-01-01 NOTE — Progress Notes (Signed)
[place], [place] [address]   CLINIC:  Medical Oncology/Hematology  PCP:  Caren Macadam, 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 Left tonsillectomy on 07/09/2015 2. Chemoradiation with cisplatin x 1 cycle and carboplatin, 5-FU x 2 cycles from 09/11/2015 to 09/30/2015 and 70 Gy.  NGS Results: not done   CURRENT THERAPY: surveillance  BRIEF ONCOLOGIC HISTORY:  Oncology History  Tonsil cancer ([place])  08/02/2015 Initial Diagnosis   Cancer of tonsillar fossa ([place])   08/20/2015 Procedure   Port-a-cath placed by IR   08/20/2015 Procedure   G-tube placed by IR   08/21/2015 - 10/09/2015 Radiation Therapy   70 Gy, Dr. Isidore Moos.   08/21/2015 - 09/10/2015 Chemotherapy   Cisplatin every 21 days with XRT.   08/28/2015 Adverse Reaction   Tinnitis, Cisplatin-induced.   09/11/2015 - 09/30/2015 Chemotherapy   Carboplatin/5FU   09/20/2015 - 09/21/2015 Hospital Admission   Intractable nausea/vomiting. Severe Mucositis   10/17/2015 - 10/20/2015 Hospital Admission   Esophageal/oral candidasis. Shingles. Fever. Chemotherapy induced pancytopenia   01/24/2016 PET scan   Resolution of the left tonsillar mass and left neck adenopathy. No findings for residual or locally recurrent tumor, adenopathy or distant metastatic disease.   03/28/2016 - 04/08/2016 Hospital Admission   Admitted with severe abdominal pain. Found to diffuse peritonitis from perforated sigmoid colon requiring colostomy. Discharged to [facility] in [place].         CANCER STAGING:  Cancer Staging  Malignant neoplasm of prostate [facility]) Staging form: Prostate, AJCC 8th Edition - Clinical stage from 02/08/2020: Stage IIC (cT1c, cN0, cM0, PSA: 5.9, Grade Group: 3) - Unsigned  Tonsil cancer ([place]) Staging form: Pharynx - Oropharynx, AJCC 7th Edition - Clinical: Stage IVA (T2, N2b, M0)  - Signed by Eppie Gibson, MD on 08/02/2015   INTERVAL HISTORY:  [name], a 71 y.o. male, here for follow-up of head and neck cancer.  Reports right lower lateral rib pain for the last 3 weeks and denies any trauma.  Appetite is 100%.  Energy levels are 70%.  Denies any major dysphagia.  Weight has been stable.  Chronic urinary frequency is also stable.  REVIEW OF SYSTEMS:  Review of Systems  Constitutional:  Negative for appetite change and fatigue.  HENT:   Negative for trouble swallowing.   Respiratory:  Negative for hemoptysis.   Gastrointestinal:  Negative for constipation and diarrhea.  Genitourinary:  Positive for frequency.   All other systems reviewed and are negative.   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 Benjamine Mola, 06/2015   Iron deficiency anemia 05/07/2016   Melanoma ([place]) 1994   right calf   Mixed hyperlipidemia    Portal hypertension ([place])    Prostate cancer ([place])    Tonsillar cancer ([place]) 06/2015   SCC chemo and radiation done   Wears contact  lenses    in left eye   Past Surgical History:  Procedure Laterality Date   BIOPSY  07/02/2016   Procedure: BIOPSY;  Surgeon: Daneil Dolin, MD;  [facility];  Service: Endoscopy;;  esophageal   BIOPSY  12/05/2021   Procedure: BIOPSY;  Surgeon: Daneil Dolin, MD;  [facility];  Service: Endoscopy;;   COLON SURGERY  2017   ruptured colon   COLONOSCOPY WITH PROPOFOL N/A 07/02/2016   Procedure: COLONOSCOPY WITH PROPOFOL;  Surgeon: Daneil Dolin, MD;  [facility];  Service: Endoscopy;  Laterality: N/A;  9:45am   COLOSTOMY TAKEDOWN N/A 09/02/2016   Procedure: LAPAROSCOPIC COLOSTOMY REVERSAL;  Surgeon: Leighton Ruff, MD;  [facility];  Service: General;  Laterality: N/A;   ESOPHAGOGASTRODUODENOSCOPY (EGD) WITH PROPOFOL N/A 07/02/2016   Procedure: ESOPHAGOGASTRODUODENOSCOPY (EGD) WITH PROPOFOL;  Surgeon: Daneil Dolin, MD;  [facility];  Service: Endoscopy;  Laterality: N/A;   ESOPHAGOGASTRODUODENOSCOPY (EGD) WITH PROPOFOL N/A 12/28/2019   Procedure: ESOPHAGOGASTRODUODENOSCOPY (EGD) WITH PROPOFOL;  Surgeon: Daneil Dolin, MD;  [facility];  Service: Endoscopy;  Laterality: N/A;  11:00am   FLEXIBLE SIGMOIDOSCOPY N/A 12/05/2021   Procedure: FLEXIBLE SIGMOIDOSCOPY;  Surgeon: Daneil Dolin, MD;  [facility];  Service: Endoscopy;  Laterality: N/A;  2:45pm, asa 3   HERNIA REPAIR Right 2019   dr Shawnie Pons at [facility] with mesh   HOT HEMOSTASIS  12/05/2021   Procedure: HOT HEMOSTASIS (ARGON PLASMA COAGULATION/BICAP);  Surgeon: Daneil Dolin, MD;  [facility];  Service: Endoscopy;;   INCISIONAL HERNIA REPAIR  09/02/2016   Procedure: HERNIA REPAIR INCISIONAL;  Surgeon: Leighton Ruff, MD;  [facility];  Service: General;;   LAPAROTOMY N/A 03/28/2016   Procedure: EXPLORATORY LAPAROTOMY, SIGMOID COLECTOMY, COLOSTOMY;  Surgeon: Leighton Ruff, MD;  [facility];  Service: General;  Laterality: N/A;   MALONEY DILATION N/A 07/02/2016   Procedure: Keturah Shavers;  Surgeon: Daneil Dolin, MD;  [facility];  Service: Endoscopy;  Laterality: N/A;   MOUTH SURGERY  2019   left lower jaw   PORT-A-CATH REMOVAL  09/02/2016   Procedure: REMOVAL PORT-A-CATH;  Surgeon: Leighton Ruff, MD;  [facility];  Service: General;;   PROSTATE BIOPSY  2021   RADIOACTIVE SEED IMPLANT N/A 06/28/2020   Procedure: RADIOACTIVE SEED IMPLANT/BRACHYTHERAPY IMPLANT WITH CYSTOSCOPY;  Surgeon: Janith Lima, MD;  Location:  [facility];  Service: Urology;  Laterality: N/A;   REMOVAL OF GASTROSTOMY TUBE N/A 09/02/2016   Procedure: REMOVAL OF GASTROSTOMY TUBE;  Surgeon: Leighton Ruff, MD;  [facility];  Service: General;  Laterality: N/A;   SPACE OAR INSTILLATION N/A 06/28/2020   Procedure: SPACE OAR INSTILLATION;  Surgeon: Janith Lima, MD;  [facility];  Service: Urology;  Laterality: N/A;   TONSILLECTOMY Left 07/09/2015   Procedure: LEFT TONSILLECTOMY;  Surgeon: Leta Baptist, MD;  Location: [place];  Service: ENT;  Laterality: Left;   TONSILLECTOMY  2017   left tonsil   Z-PLASTY SCAR REVISION N/A 09/02/2016   Procedure: SCAR REVISION;  Surgeon: Leighton Ruff, MD;  [facility];  Service: General;  Laterality: N/A;    SOCIAL HISTORY:  Social History   Socioeconomic History   Marital status: Single    Spouse name: Not on file   Number of children: 0   Years of education: Not on file   Highest education level: Not on  file  Occupational History   Not on file  Tobacco Use   Smoking status: Some Days    Packs/day: 0.50    Years: 30.00    Total pack years: 15.00    Types: Cigarettes    Last attempt to quit: 11/22/2016    Years since quitting: 5.1   Smokeless tobacco: Never   Tobacco comments:    smoke social occ  Vaping Use   Vaping Use: Never used  Substance and Sexual Activity   Alcohol use: No    Alcohol/week: 0.0 standard drinks of alcohol    Comment: former abuse- quit 09/2011   Drug use: No   Sexual activity: Not Currently  Other Topics Concern   Not on file  Social History Narrative   Divorced.   No children.   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    Has been smoking off/on since age 89, quit smoking 11/2016.    Exercises minimally   Eats all food groups.   Wears seatbelt.   Mother lives with him, has dementia.   Attends AA meetings.     Social Determinants of Health   Financial Resource Strain: Not on file  Food Insecurity: Not on file  Transportation Needs: Not on file  Physical Activity: Not on file  Stress: Not on file  Social Connections: Not on file  Intimate Partner Violence: Not At Risk (06/27/2020)   Humiliation, Afraid, Rape, and Kick questionnaire    Fear of Current or Ex-Partner: No    Emotionally Abused: No    Physically Abused: No    Sexually Abused: No    FAMILY HISTORY:  Family History  Problem Relation Age of Onset   Macular degeneration Mother    Dementia Mother    Alcoholism Father    Prostate cancer Father    Diabetes Maternal Aunt    Diabetes Maternal Uncle    Prostate cancer Maternal Uncle    Mental illness Sister    Pancreatic cancer Neg Hx    Breast cancer Neg Hx    Colon cancer Neg Hx     CURRENT MEDICATIONS:  Current Outpatient Medications  Medication Sig Dispense Refill   aspirin EC 81 MG tablet Take 1 tablet (81 mg total) by mouth daily. (Patient taking differently: Take 81 mg by mouth 2 (two) times a week.)     atorvastatin (LIPITOR) 40 MG tablet Take 1 tablet (40 mg total) by mouth daily. (Patient taking differently: Take 40 mg by mouth at bedtime.) 90 tablet 0   B Complex Vitamins (VITAMIN-B COMPLEX PO) Take 1 tablet by mouth daily.     cetirizine (ZYRTEC) 10 MG tablet Take 10 mg by mouth daily.     chlorhexidine (PERIDEX) 0.12 % solution Use as directed 15 mLs in the mouth or throat 2 (two) times daily as needed (prevention).     Cholecalciferol (VITAMIN D) 50 MCG (2000 UT) tablet Take 2,000 Units by mouth daily.     DEXILANT 60 MG capsule TAKE 1 CAPSULE BY MOUTH DAILY 30 capsule 6   docusate sodium (COLACE) 250 MG capsule Take 250 mg by mouth 2 (two) times daily.     DULoxetine (CYMBALTA) 60 MG capsule Take 120 mg by mouth daily.     fluticasone (FLONASE) 50 MCG/ACT nasal spray Place 2 sprays into both nostrils daily as needed for allergies.     GEMTESA 75 MG TABS  Take  75 mg by mouth daily.     lisinopril (ZESTRIL) 5 MG tablet Take 5 mg by mouth daily.     pilocarpine (SALAGEN) 5 MG tablet Take 5 mg by mouth in the morning, at noon, and at bedtime.     polyethylene glycol-electrolytes (NULYTELY) 420 g solution As directed 4000 mL 0   sodium fluoride (FLUORISHIELD) 1.1 % GEL dental gel Instill one drop of gel per tooth space of fluoride tray. Place over teeth for 5 minutes. Remove. Spit out excess. Repeat nightly. (Patient taking differently: Place 1 Application onto teeth at bedtime. Instill one drop of gel per tooth space of fluoride tray. Place over teeth for 5 minutes. Remove. Spit out excess. Repeat nightly.) 125 mL 11   tamsulosin (FLOMAX) 0.4 MG CAPS capsule Take 0.8 mg by mouth at bedtime.     triamcinolone (KENALOG) 0.1 % paste Use as directed 1 Application in the mouth or throat daily as needed (ulcers).     triamcinolone cream (KENALOG) 0.1 % Apply 1 Application topically 2 (two) times daily as needed (eczema).     Turmeric 500 MG CAPS Take 500 mg by mouth in the morning and at bedtime.     No current facility-administered medications for this visit.   Facility-Administered Medications Ordered in Other Visits  Medication Dose Route Frequency Provider Last Rate Last Admin   0.9 %  sodium chloride infusion   Intravenous Continuous Kefalas, Manon Hilding, PA-C   New Bag at 03/28/16 1754   0.9 %  sodium chloride infusion   Intravenous Continuous Kefalas, Marcello Moores S, PA-C       0.9 %  sodium chloride infusion   Intravenous Continuous Penland, Larene Beach K, MD       0.9 %  sodium chloride infusion   Intravenous Continuous [place]land, Kelby Fam, MD       0.9 %  sodium chloride infusion   Intravenous Continuous [place], Kelby Fam, MD        ALLERGIES:  Allergies  Allergen Reactions   Bee Venom Swelling and Anaphylaxis   Other Other (See Comments)    Opthalmic mycin irritates eyes.     Tobramycin Ophthalmic [Tobramycin]     Other reaction(s): DRYNESS, IRRITATION     PHYSICAL EXAM:  Performance status (ECOG): 1 - Symptomatic but completely ambulatory  There were no vitals filed for this visit.  Wt Readings from Last 3 Encounters:  12/05/21 175 lb (79.4 kg)  12/02/21 175 lb (79.4 kg)  11/04/21 176 lb 12.8 oz (80.2 kg)   Physical Exam Vitals reviewed.  Constitutional:      Appearance: Normal appearance.  HENT:     Mouth/Throat:     Mouth: No oral lesions.     Tongue: No lesions.  Cardiovascular:     Rate and Rhythm: Normal rate and regular rhythm.     Pulses: Normal pulses.     Heart sounds: Normal heart sounds.  Pulmonary:     Effort: Pulmonary effort is normal.     Breath sounds: Normal breath sounds.  Abdominal:     Palpations: Abdomen is soft. There is no hepatomegaly, splenomegaly or mass.     Tenderness: There is no abdominal tenderness.  Lymphadenopathy:     Cervical: No cervical adenopathy.     Right cervical: No superficial, deep or posterior cervical adenopathy.    Left cervical: No superficial, deep or posterior cervical adenopathy.     Upper Body:     Right upper body: No supraclavicular adenopathy.  Left upper body: No supraclavicular adenopathy.  Neurological:     General: No focal deficit present.     Mental Status: He is alert and oriented to person, place, and time.  Psychiatric:        Mood and Affect: Mood normal.        Behavior: Behavior normal.      LABORATORY DATA:  I have reviewed the labs as listed.     Latest Ref Rng & Units 12/25/2021   12:18 PM 12/02/2021   11:28 AM 06/27/2021    8:38 AM  CBC  WBC 4.0 - 10.5 K/uL 6.2  8.2  8.1   Hemoglobin 13.0 - 17.0 g/dL 11.7  11.8  13.2   Hematocrit 39.0 - 52.0 % 36.8  37.7  42.7   Platelets 150 - 400 K/uL 248  241  242       Latest Ref Rng & Units 12/25/2021   12:18 PM 12/02/2021   11:28 AM 06/27/2021    8:38 AM  CMP  Glucose 70 - 99 mg/dL 127  119  152   BUN 8 - 23 mg/dL '18  14  17   '$ Creatinine 0.61 - 1.24 mg/dL 0.83  0.79  0.93   Sodium 135 - 145  mmol/L 134  132  138   Potassium 3.5 - 5.1 mmol/L 4.2  4.4  4.3   Chloride 98 - 111 mmol/L 102  104  103   CO2 22 - 32 mmol/L '27  20  28   '$ Calcium 8.9 - 10.3 mg/dL 8.9  9.1  9.1   Total Protein 6.5 - 8.1 g/dL 7.3  7.2  7.3   Total Bilirubin 0.3 - 1.2 mg/dL 0.5  0.6  0.4   Alkaline Phos 38 - 126 U/L 55  77  56   AST 15 - 41 U/L '19  19  15   '$ ALT 0 - 44 U/L '17  21  14     '$ DIAGNOSTIC IMAGING:  I have independently reviewed the scans and discussed with the patient. CT CHEST LUNG CANCER SCREENING LOW DOSE WO CONTRAST  Result Date: 12/26/2021 CLINICAL DATA:  Current smoker with 32 pack-year history EXAM: CT CHEST WITHOUT CONTRAST LOW-DOSE FOR LUNG CANCER SCREENING TECHNIQUE: Multidetector CT imaging of the chest was performed following the standard protocol without IV contrast. RADIATION DOSE REDUCTION: This exam was performed according to the departmental dose-optimization program which includes automated exposure control, adjustment of the mA and/or kV according to patient size and/or use of iterative reconstruction technique. COMPARISON:  Lung cancer screening CT dated December 24, 2020 FINDINGS: Cardiovascular: Normal heart size. Pericardial effusion. Normal caliber thoracic aorta with severe calcified plaque. Severe coronary artery calcifications. Mediastinum/Nodes: Esophagus and thyroid are unremarkable. No pathologically enlarged lymph nodes seen in the chest. Lungs/Pleura: Central airways are patent. Mild centrilobular emphysema. Biapical pleural-parenchymal scarring, unchanged when compared to prior. No consolidation, pleural effusion or pneumothorax. Calcified pulmonary nodule of the right lower lobe. No suspicious pulmonary nodules. Upper Abdomen: No acute abnormality. Musculoskeletal: No chest wall mass or suspicious bone lesions identified. IMPRESSION: 1. Lung-RADS 1, negative. Continue annual screening with low-dose chest CT without contrast in 12 months. 2. Coronary artery calcifications, aortic  Atherosclerosis (ICD10-I70.0) and Emphysema (ICD10-J43.9). Electronically Signed   By: Yetta Glassman M.D.   On: 12/26/2021 09:46   CT Soft Tissue Neck W Contrast  Result Date: 12/25/2021 CLINICAL DATA:  Follow-up tonsil cancer, status post chemo and radiation, no new complaints EXAM: CT NECK WITH CONTRAST  TECHNIQUE: Multidetector CT imaging of the neck was performed using the standard protocol following the bolus administration of intravenous contrast. RADIATION DOSE REDUCTION: This exam was performed according to the departmental dose-optimization program which includes automated exposure control, adjustment of the mA and/or kV according to patient size and/or use of iterative reconstruction technique. CONTRAST:  29m OMNIPAQUE IOHEXOL 300 MG/ML  SOLN COMPARISON:  12/24/2020 FINDINGS: Pharynx and larynx: Mild soft tissue thickening, most prominently in the oropharynx and supraglottic larynx, likely reflecting postradiation changes, without specific mass or swelling. No abnormal enhancement. Salivary glands: Postradiation changes with fatty atrophy, with relative preservation of the right parotid gland. No acute finding. Thyroid: Normal. Lymph nodes: None enlarged or abnormal density. Vascular: Patent. Calcified atherosclerosis in the aorta, left subclavian, carotids, and vertebral arteries. Limited intracranial: Negative. Visualized orbits: Status post bilateral lens replacements. Otherwise negative. Mastoids and visualized paranasal sinuses: Clear. Skeleton: No acute osseous abnormality. Degenerative changes in the cervical spine. Upper chest: Please see same-day CT chest. Other: None. IMPRESSION: Postradiation changes without evidence of recurrent disease or adenopathy. Electronically Signed   By: AMerilyn BabaM.D.   On: 12/25/2021 14:58     ASSESSMENT:  1.  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   2.  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 TBenjamine Mola referred to Dr. WNicolette Bangwho did marginal mandibulectomy with primary closure on 09/20/2017. -This showed 2.1 x 0.9 x 0.4 cm squamous cell carcinoma with mandibular bone invasion.  3.  Prostate cancer: - Stage T1c adenocarcinoma, Gleason 4+3, PSA 5.91 - Status post seed implantation by Dr. MTammi Klippel   PLAN:  1.  Stage IV left tonsillar cancer: -CT soft tissue neck from 12/25/2021 reviewed by me showed postradiation changes without evidence of recurrence or adenopathy. - Labs reviewed by me shows TSH was normal.  LFTs were normal.  Mild normocytic anemia. - Physical examination did not reveal any palpable nodes or tonsillar mass. - Recommend follow-up in 1 year with repeat CT of the neck and TSH.  His hemoglobin is slightly low at 11.7.  Will check ferritin and iron panel at next visit.   2.  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 - Continue follow-up with Dr. WNicolette Bangand Dr. TBenjamine Mola   3.  Smoking history: -CT chest low-dose screening scan on 12/25/2021 was lung RADS 1.  We will continue annual screening.    Orders placed this encounter:  No orders of the defined types were placed in this encounter.    SDerek Jack MD [phone]  I, KThana Ates am acting as a scribe for Dr. SDerek Jack  I, SDerek JackMD, have reviewed the above documentation for accuracy and completeness, and I agree with the above.

## 2022-01-01 NOTE — Patient Instructions (Signed)
[place] at [facility] Discharge Instructions   You were seen and examined today by Dr. Delton Coombes.  He reviewed the results of your lab work and scans which are normal/stable.  We will see you back in 1 year with repeat lab work and scans.    Thank you for choosing [place] at [facility] to provide your oncology and hematology care.  To afford each patient quality time with our provider, please arrive at least 15 minutes before your scheduled appointment time.   If you have a lab appointment with the [place] please come in thru the Main Entrance and check in at the main information desk.  You need to re-schedule your appointment should you arrive 10 or more minutes late.  We strive to give you quality time with our providers, and arriving late affects you and other patients whose appointments are after yours.  Also, if you no show three or more times for appointments you may be dismissed from the clinic at the providers discretion.     Again, thank you for choosing [facility].  Our hope is that these requests will decrease the amount of time that you wait before being seen by our physicians.       _____________________________________________________________  Should you have questions after your visit to [facility], please contact our office at [phone] and follow the prompts.  Our office hours are 8:00 a.m. and 4:30 p.m. Monday - Friday.  Please note that voicemails left after 4:00 p.m. may not be returned until the following business day.  We are closed weekends and major holidays.  You do have access to a nurse 24-7, just call the main number to the clinic [phone] and do not press any options, hold on the line and a nurse will answer the phone.    For prescription refill requests, have your pharmacy contact our office and allow 72 hours.    Due to Covid, you will need to wear a mask upon  entering the hospital. If you do not have a mask, a mask will be given to you at the Main Entrance upon arrival. For doctor visits, patients may have 1 support person age 35 or older with them. For treatment visits, patients can not have anyone with them due to social distancing guidelines and our immunocompromised population.

## 2022-01-09 ENCOUNTER — Ambulatory Visit: Payer: Medicare Other | Admitting: Internal Medicine

## 2022-01-16 DIAGNOSIS — D509 Iron deficiency anemia, unspecified: Secondary | ICD-10-CM | POA: Diagnosis not present

## 2022-01-16 DIAGNOSIS — I10 Essential (primary) hypertension: Secondary | ICD-10-CM | POA: Diagnosis not present

## 2022-01-16 DIAGNOSIS — E119 Type 2 diabetes mellitus without complications: Secondary | ICD-10-CM | POA: Diagnosis not present

## 2022-01-16 DIAGNOSIS — E782 Mixed hyperlipidemia: Secondary | ICD-10-CM | POA: Diagnosis not present

## 2022-01-16 DIAGNOSIS — I700 Atherosclerosis of aorta: Secondary | ICD-10-CM | POA: Diagnosis not present

## 2022-01-21 DIAGNOSIS — Z85820 Personal history of malignant melanoma of skin: Secondary | ICD-10-CM | POA: Diagnosis not present

## 2022-01-21 DIAGNOSIS — L218 Other seborrheic dermatitis: Secondary | ICD-10-CM | POA: Diagnosis not present

## 2022-01-21 DIAGNOSIS — Z85828 Personal history of other malignant neoplasm of skin: Secondary | ICD-10-CM | POA: Diagnosis not present

## 2022-01-21 DIAGNOSIS — L570 Actinic keratosis: Secondary | ICD-10-CM | POA: Diagnosis not present

## 2022-04-01 DIAGNOSIS — D485 Neoplasm of uncertain behavior of skin: Secondary | ICD-10-CM | POA: Diagnosis not present

## 2022-04-01 DIAGNOSIS — L820 Inflamed seborrheic keratosis: Secondary | ICD-10-CM | POA: Diagnosis not present

## 2022-04-09 DIAGNOSIS — C099 Malignant neoplasm of tonsil, unspecified: Secondary | ICD-10-CM | POA: Diagnosis not present

## 2022-04-13 DIAGNOSIS — Z85819 Personal history of malignant neoplasm of unspecified site of lip, oral cavity, and pharynx: Secondary | ICD-10-CM | POA: Diagnosis not present

## 2022-04-14 DIAGNOSIS — Z23 Encounter for immunization: Secondary | ICD-10-CM | POA: Diagnosis not present

## 2022-04-15 DIAGNOSIS — Z23 Encounter for immunization: Secondary | ICD-10-CM | POA: Diagnosis not present

## 2022-04-21 DIAGNOSIS — I700 Atherosclerosis of aorta: Secondary | ICD-10-CM | POA: Diagnosis not present

## 2022-04-21 DIAGNOSIS — I10 Essential (primary) hypertension: Secondary | ICD-10-CM | POA: Diagnosis not present

## 2022-04-21 DIAGNOSIS — E118 Type 2 diabetes mellitus with unspecified complications: Secondary | ICD-10-CM | POA: Diagnosis not present

## 2022-04-21 DIAGNOSIS — E782 Mixed hyperlipidemia: Secondary | ICD-10-CM | POA: Diagnosis not present

## 2022-04-21 DIAGNOSIS — D509 Iron deficiency anemia, unspecified: Secondary | ICD-10-CM | POA: Diagnosis not present

## 2022-05-05 DIAGNOSIS — C61 Malignant neoplasm of prostate: Secondary | ICD-10-CM | POA: Diagnosis not present

## 2022-05-12 DIAGNOSIS — N401 Enlarged prostate with lower urinary tract symptoms: Secondary | ICD-10-CM | POA: Diagnosis not present

## 2022-05-12 DIAGNOSIS — R3915 Urgency of urination: Secondary | ICD-10-CM | POA: Diagnosis not present

## 2022-05-12 DIAGNOSIS — C61 Malignant neoplasm of prostate: Secondary | ICD-10-CM | POA: Diagnosis not present

## 2022-05-17 ENCOUNTER — Other Ambulatory Visit: Payer: Self-pay | Admitting: Internal Medicine

## 2022-05-22 ENCOUNTER — Other Ambulatory Visit: Payer: Self-pay

## 2022-05-22 ENCOUNTER — Telehealth: Payer: Self-pay

## 2022-05-22 MED ORDER — PANTOPRAZOLE SODIUM 40 MG PO TBEC: 40.0000 mg | DELAYED_RELEASE_TABLET | Freq: Every day | ORAL | 11 refills | Status: DC

## 2022-05-22 NOTE — Telephone Encounter (Signed)
Pt called requesting an Rx for pantoprazole to be called in to take every other day alternating with dexilant. Pt states that his deductible is high and will start over the beginning of the year and he knows that he won't be able to afford his dexilant the beginning of the year until his deductible is met. Pt is aware that you are out of the office and that this message may not be answered until Tues. Pt verbalized understanding.

## 2022-05-22 NOTE — Telephone Encounter (Signed)
Rx was sent to the pharmacy and pt was made aware.

## 2022-07-22 DIAGNOSIS — L570 Actinic keratosis: Secondary | ICD-10-CM | POA: Diagnosis not present

## 2022-07-22 DIAGNOSIS — L309 Dermatitis, unspecified: Secondary | ICD-10-CM | POA: Diagnosis not present

## 2022-07-22 DIAGNOSIS — D485 Neoplasm of uncertain behavior of skin: Secondary | ICD-10-CM | POA: Diagnosis not present

## 2022-07-22 DIAGNOSIS — L820 Inflamed seborrheic keratosis: Secondary | ICD-10-CM | POA: Diagnosis not present

## 2022-07-22 DIAGNOSIS — Z85820 Personal history of malignant melanoma of skin: Secondary | ICD-10-CM | POA: Diagnosis not present

## 2022-07-22 DIAGNOSIS — Z85828 Personal history of other malignant neoplasm of skin: Secondary | ICD-10-CM | POA: Diagnosis not present

## 2022-08-18 DIAGNOSIS — B370 Candidal stomatitis: Secondary | ICD-10-CM | POA: Diagnosis not present

## 2022-08-18 DIAGNOSIS — K121 Other forms of stomatitis: Secondary | ICD-10-CM | POA: Diagnosis not present

## 2022-08-18 DIAGNOSIS — K130 Diseases of lips: Secondary | ICD-10-CM | POA: Diagnosis not present

## 2022-08-18 DIAGNOSIS — K068 Other specified disorders of gingiva and edentulous alveolar ridge: Secondary | ICD-10-CM | POA: Diagnosis not present

## 2022-09-04 DIAGNOSIS — Z7253 High risk bisexual behavior: Secondary | ICD-10-CM | POA: Diagnosis not present

## 2022-09-04 DIAGNOSIS — A539 Syphilis, unspecified: Secondary | ICD-10-CM | POA: Diagnosis not present

## 2022-09-10 DIAGNOSIS — A539 Syphilis, unspecified: Secondary | ICD-10-CM | POA: Diagnosis not present

## 2022-09-10 DIAGNOSIS — A64 Unspecified sexually transmitted disease: Secondary | ICD-10-CM | POA: Diagnosis not present

## 2022-09-17 DIAGNOSIS — A64 Unspecified sexually transmitted disease: Secondary | ICD-10-CM | POA: Diagnosis not present

## 2022-09-17 DIAGNOSIS — A539 Syphilis, unspecified: Secondary | ICD-10-CM | POA: Diagnosis not present

## 2022-10-26 DIAGNOSIS — Z961 Presence of intraocular lens: Secondary | ICD-10-CM | POA: Diagnosis not present

## 2022-10-26 DIAGNOSIS — E119 Type 2 diabetes mellitus without complications: Secondary | ICD-10-CM | POA: Diagnosis not present

## 2022-11-04 DIAGNOSIS — C61 Malignant neoplasm of prostate: Secondary | ICD-10-CM | POA: Diagnosis not present

## 2022-11-10 DIAGNOSIS — C61 Malignant neoplasm of prostate: Secondary | ICD-10-CM | POA: Diagnosis not present

## 2022-11-10 DIAGNOSIS — R3915 Urgency of urination: Secondary | ICD-10-CM | POA: Diagnosis not present

## 2022-11-19 DIAGNOSIS — C61 Malignant neoplasm of prostate: Secondary | ICD-10-CM | POA: Diagnosis not present

## 2022-11-19 DIAGNOSIS — F321 Major depressive disorder, single episode, moderate: Secondary | ICD-10-CM | POA: Diagnosis not present

## 2022-11-19 DIAGNOSIS — J439 Emphysema, unspecified: Secondary | ICD-10-CM | POA: Diagnosis not present

## 2022-11-19 DIAGNOSIS — I10 Essential (primary) hypertension: Secondary | ICD-10-CM | POA: Diagnosis not present

## 2022-11-19 DIAGNOSIS — E118 Type 2 diabetes mellitus with unspecified complications: Secondary | ICD-10-CM | POA: Diagnosis not present

## 2022-11-19 DIAGNOSIS — Z0000 Encounter for general adult medical examination without abnormal findings: Secondary | ICD-10-CM | POA: Diagnosis not present

## 2022-11-19 DIAGNOSIS — F1911 Other psychoactive substance abuse, in remission: Secondary | ICD-10-CM | POA: Diagnosis not present

## 2022-11-19 DIAGNOSIS — K7030 Alcoholic cirrhosis of liver without ascites: Secondary | ICD-10-CM | POA: Diagnosis not present

## 2022-11-19 DIAGNOSIS — D649 Anemia, unspecified: Secondary | ICD-10-CM | POA: Diagnosis not present

## 2022-11-19 DIAGNOSIS — D509 Iron deficiency anemia, unspecified: Secondary | ICD-10-CM | POA: Diagnosis not present

## 2022-11-19 DIAGNOSIS — E782 Mixed hyperlipidemia: Secondary | ICD-10-CM | POA: Diagnosis not present

## 2022-11-19 DIAGNOSIS — F17201 Nicotine dependence, unspecified, in remission: Secondary | ICD-10-CM | POA: Diagnosis not present

## 2022-11-19 DIAGNOSIS — I700 Atherosclerosis of aorta: Secondary | ICD-10-CM | POA: Diagnosis not present

## 2022-12-22 DIAGNOSIS — A539 Syphilis, unspecified: Secondary | ICD-10-CM | POA: Diagnosis not present

## 2022-12-22 DIAGNOSIS — Z7253 High risk bisexual behavior: Secondary | ICD-10-CM | POA: Diagnosis not present

## 2022-12-24 ENCOUNTER — Ambulatory Visit (HOSPITAL_COMMUNITY)
Admission: RE | Admit: 2022-12-24 | Discharge: 2022-12-24 | Disposition: A | Discharge status: Home / Self Care | Payer: Medicare Other | Source: Ambulatory Visit | Attending: Hematology | Admitting: Hematology

## 2022-12-24 ENCOUNTER — Inpatient Hospital Stay: Payer: Medicare Other | Attending: Hematology

## 2022-12-24 DIAGNOSIS — Z122 Encounter for screening for malignant neoplasm of respiratory organs: Secondary | ICD-10-CM | POA: Insufficient documentation

## 2022-12-24 DIAGNOSIS — M47812 Spondylosis without myelopathy or radiculopathy, cervical region: Secondary | ICD-10-CM | POA: Diagnosis not present

## 2022-12-24 DIAGNOSIS — Z7982 Long term (current) use of aspirin: Secondary | ICD-10-CM | POA: Diagnosis not present

## 2022-12-24 DIAGNOSIS — Z923 Personal history of irradiation: Secondary | ICD-10-CM | POA: Insufficient documentation

## 2022-12-24 DIAGNOSIS — Z87891 Personal history of nicotine dependence: Secondary | ICD-10-CM | POA: Insufficient documentation

## 2022-12-24 DIAGNOSIS — C099 Malignant neoplasm of tonsil, unspecified: Secondary | ICD-10-CM | POA: Insufficient documentation

## 2022-12-24 DIAGNOSIS — D649 Anemia, unspecified: Secondary | ICD-10-CM

## 2022-12-24 DIAGNOSIS — Z933 Colostomy status: Secondary | ICD-10-CM | POA: Diagnosis not present

## 2022-12-24 DIAGNOSIS — Z8546 Personal history of malignant neoplasm of prostate: Secondary | ICD-10-CM | POA: Diagnosis not present

## 2022-12-24 DIAGNOSIS — Z85818 Personal history of malignant neoplasm of other sites of lip, oral cavity, and pharynx: Secondary | ICD-10-CM | POA: Diagnosis not present

## 2022-12-24 DIAGNOSIS — Z79899 Other long term (current) drug therapy: Secondary | ICD-10-CM | POA: Diagnosis not present

## 2022-12-24 DIAGNOSIS — Z9221 Personal history of antineoplastic chemotherapy: Secondary | ICD-10-CM | POA: Insufficient documentation

## 2022-12-24 DIAGNOSIS — E611 Iron deficiency: Secondary | ICD-10-CM | POA: Diagnosis not present

## 2022-12-24 DIAGNOSIS — M5032 Other cervical disc degeneration, mid-cervical region, unspecified level: Secondary | ICD-10-CM | POA: Diagnosis not present

## 2022-12-24 LAB — COMPREHENSIVE METABOLIC PANEL
ALT: 18 U/L (ref 0–44)
AST: 16 U/L (ref 15–41)
Albumin: 4 g/dL (ref 3.5–5.0)
Alkaline Phosphatase: 46 U/L (ref 38–126)
Anion gap: 8 (ref 5–15)
BUN: 18 mg/dL (ref 8–23)
CO2: 27 mmol/L (ref 22–32)
Calcium: 9 mg/dL (ref 8.9–10.3)
Chloride: 101 mmol/L (ref 98–111)
Creatinine, Ser: 0.82 mg/dL (ref 0.61–1.24)
GFR, Estimated: >60 mL/min (ref >60)
Glucose, Bld: 187 mg/dL — ABNORMAL HIGH (ref 70–99)
Potassium: 4.1 mmol/L (ref 3.5–5.1)
Sodium: 136 mmol/L (ref 135–145)
Total Bilirubin: 0.4 mg/dL (ref 0.3–1.2)
Total Protein: 6.9 g/dL (ref 6.5–8.1)

## 2022-12-24 LAB — IRON AND TIBC
Iron: 43 ug/dL — ABNORMAL LOW (ref 45–182)
Saturation Ratios: 11 % — ABNORMAL LOW (ref 17.9–39.5)
TIBC: 410 ug/dL (ref 250–450)
UIBC: 367 ug/dL

## 2022-12-24 LAB — CBC WITH DIFFERENTIAL/PLATELET
Abs Immature Granulocytes: 0.09 10*3/uL — ABNORMAL HIGH (ref 0.00–0.07)
Basophils Absolute: 0.2 10*3/uL — ABNORMAL HIGH (ref 0.0–0.1)
Basophils Relative: 1 %
Eosinophils Absolute: 0.2 10*3/uL (ref 0.0–0.5)
Eosinophils Relative: 2 %
HCT: 43.9 % (ref 39.0–52.0)
Hemoglobin: 13.6 g/dL (ref 13.0–17.0)
Immature Granulocytes: 1 %
Lymphocytes Relative: 31 %
Lymphs Abs: 3.5 10*3/uL (ref 0.7–4.0)
MCH: 27.8 pg (ref 26.0–34.0)
MCHC: 31 g/dL (ref 30.0–36.0)
MCV: 89.8 fL (ref 80.0–100.0)
Monocytes Absolute: 0.7 10*3/uL (ref 0.1–1.0)
Monocytes Relative: 6 %
Neutro Abs: 6.6 10*3/uL (ref 1.7–7.7)
Neutrophils Relative %: 59 %
Platelets: 261 10*3/uL (ref 150–400)
RBC: 4.89 MIL/uL (ref 4.22–5.81)
RDW: 15.3 % (ref 11.5–15.5)
WBC: 11.3 10*3/uL — ABNORMAL HIGH (ref 4.0–10.5)
nRBC: 0 % (ref 0.0–0.2)

## 2022-12-24 LAB — FERRITIN: Ferritin: 10 ng/mL — ABNORMAL LOW (ref 24–336)

## 2022-12-24 LAB — TSH: TSH: 4.166 u[IU]/mL (ref 0.350–4.500)

## 2022-12-24 MED ORDER — IOHEXOL 300 MG/ML  SOLN
75.0000 mL | Freq: Once | INTRAMUSCULAR | Status: AC | PRN
  Administered 2022-12-24: 75 mL via INTRAVENOUS

## 2022-12-30 NOTE — Progress Notes (Signed)
[facility] [address], [place] [address]    Clinic Day:  12/31/2022  Referring physician: Aliene Beams, MD  Patient Care Team: Aliene Beams, MD as PCP - General (Family Medicine) Jonelle Sidle, MD as PCP - Cardiology (Cardiology) Jena Gauss Gerrit Friends, MD as Consulting Physician (Gastroenterology) Felicita Gage, RN Nurse Navigator as Registered Nurse (Medical Oncology)   ASSESSMENT & PLAN:   Assessment: 1.  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   2.  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 Suszanne Conners, referred to Dr. Hezzie Bump who did marginal mandibulectomy with primary closure on 09/20/2017. -This showed 2.1 x 0.9 x 0.4 cm squamous cell carcinoma with mandibular bone invasion.  3.  Prostate cancer: - Stage T1c adenocarcinoma, Gleason 4+3, PSA 5.91 - Status post seed implantation by Dr. Kathrynn Running.    Plan: 1.  Stage IV left tonsillar cancer: - Physical exam today: No oropharyngeal masses.  No palpable nodes. - CT soft tissue neck on 12/24/2022: No evidence of recurrence. - Labs from 01/12/2023: LFTs and CBC grossly normal.  TSH is 4.16. - Recommend follow-up in 1 year with repeat scan and labs.   2.  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 - Continue follow-up with Dr. Hezzie Bump and Dr. Suszanne Conners   3.  Smoking history: - He quit smoking in January 2023. - We reviewed CT scan of the chest from 12/24/2022: Lung RADS 1S. - Recommend repeating scan in 1 year.  4.  Iron deficiency state: - Ferritin is low at 10.  Hemoglobin is 13.6. - Recommend starting iron tablet on Monday Wednesday and  Friday.  Orders Placed This Encounter  Procedures   CT CHEST LUNG CA SCREEN LOW DOSE W/O CM    Standing Status:   Future    Standing Expiration Date:   12/31/2023    Order Specific Question:   Preferred Imaging Location?    Answer:   [facility]   CT SOFT TISSUE NECK W CONTRAST    Standing Status:   Future    Standing Expiration Date:   12/31/2023    Order Specific Question:   If indicated for the ordered procedure, I authorize the administration of contrast media per Radiology protocol    Answer:   Yes    Order Specific Question:   Does the patient have a contrast media/X-ray dye allergy?    Answer:   No    Order Specific Question:   Preferred imaging location?    Answer:   [facility]   CBC with Differential    Standing Status:   Future    Standing Expiration Date:   12/31/2023   Comprehensive metabolic panel    Standing Status:   Future    Standing Expiration Date:   12/31/2023   Iron and TIBC ([facility] ONLY)    Standing Status:   Future    Standing Expiration Date:   12/31/2023   Ferritin    Standing Status:   Future    Standing Expiration Date:   12/31/2023   TSH    Standing Status:   Future    Standing Expiration Date:  12/31/2023      I,Helena R Teague,acting as a scribe for Doreatha Massed, MD.,have documented all relevant documentation on the behalf of Doreatha Massed, MD,as directed by  Doreatha Massed, MD while in the presence of Doreatha Massed, MD.   I, Doreatha Massed MD, have reviewed the above documentation for accuracy and completeness, and I agree with the above.   Doreatha Massed, MD   8/8/20244:31 PM  CHIEF COMPLAINT:   Diagnosis: Malignant neoplasm of prostate and tonsil cancer   Cancer Staging  Malignant neoplasm of prostate [facility]) Staging form: Prostate, AJCC 8th Edition - Clinical stage from 02/08/2020: Stage IIC (cT1c, cN0, cM0, PSA: 5.9, Grade Group: 3) - Unsigned  Tonsil cancer (HCC) Staging  form: Pharynx - Oropharynx, AJCC 7th Edition - Clinical: Stage IVA (T2, N2b, M0) - Signed by Lonie Peak, MD on 08/02/2015    Prior Therapy: 1. Left tonsillectomy on 07/09/2015 2. Chemoradiation with cisplatin x 1 cycle and carboplatin, 5-FU x 2 cycles from 09/11/2015 to 09/30/2015 and 70 Gy.  Current Therapy:  surveillance   HISTORY OF PRESENT ILLNESS:   Oncology History  Tonsil cancer (HCC)  08/02/2015 Initial Diagnosis   Cancer of tonsillar fossa (HCC)   08/20/2015 Procedure   Port-a-cath placed by IR   08/20/2015 Procedure   G-tube placed by IR   08/21/2015 - 10/09/2015 Radiation Therapy   70 Gy, Dr. Basilio Cairo.   08/21/2015 - 09/10/2015 Chemotherapy   Cisplatin every 21 days with XRT.   08/28/2015 Adverse Reaction   Tinnitis, Cisplatin-induced.   09/11/2015 - 09/30/2015 Chemotherapy   Carboplatin/5FU   09/20/2015 - 09/21/2015 Hospital Admission   Intractable nausea/vomiting. Severe Mucositis   10/17/2015 - 10/20/2015 Hospital Admission   Esophageal/oral candidasis. Shingles. Fever. Chemotherapy induced pancytopenia   01/24/2016 PET scan   Resolution of the left tonsillar mass and left neck adenopathy. No findings for residual or locally recurrent tumor, adenopathy or distant metastatic disease.   03/28/2016 - 04/08/2016 Hospital Admission   Admitted with severe abdominal pain. Found to diffuse peritonitis from perforated sigmoid colon requiring colostomy. Discharged to [facility] in [place].          INTERVAL HISTORY:   Frank Castillo is a 72 y.o. male presenting to clinic today for follow up of malignant neoplasm of prostate and tonsil cancer. He was last seen by me on 01/01/22.  His most recent ferritin level from 12/24/22 was decreased at 10. Iron and TIBC from 12/24/22 found decreased iron at 43 and decreased saturation ratios at 11.   Today, he states that he is doing well overall. His appetite level is at 75%. His energy level is at 60%.  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 Suszanne Conners, 06/2015   Iron deficiency anemia 05/07/2016   Melanoma (HCC) 1994   right calf   Mixed hyperlipidemia    Portal hypertension (HCC)    Prostate cancer (HCC)    Tonsillar cancer (HCC) 06/2015   SCC chemo  and radiation done   Wears contact lenses    in left eye    Surgical History: Past Surgical History:  Procedure Laterality Date   BIOPSY  07/02/2016   Procedure: BIOPSY;  Surgeon: Corbin Ade, MD;  [facility];  Service: Endoscopy;;  esophageal   BIOPSY  12/05/2021   Procedure: BIOPSY;  Surgeon: Corbin Ade, MD;  [facility];  Service: Endoscopy;;   COLON SURGERY  2017   ruptured colon   COLONOSCOPY WITH PROPOFOL N/A 07/02/2016   Procedure: COLONOSCOPY WITH PROPOFOL;  Surgeon: Corbin Ade, MD;  [facility];  Service: Endoscopy;  Laterality: N/A;  9:45am   COLOSTOMY TAKEDOWN N/A 09/02/2016   Procedure: LAPAROSCOPIC COLOSTOMY REVERSAL;  Surgeon: Romie Levee, MD;  [facility]: [facility];  Service: General;  Laterality: N/A;   ESOPHAGOGASTRODUODENOSCOPY (EGD) WITH PROPOFOL N/A 07/02/2016   Procedure: ESOPHAGOGASTRODUODENOSCOPY (EGD) WITH PROPOFOL;  Surgeon: Corbin Ade, MD;  [facility];  Service: Endoscopy;  Laterality: N/A;   ESOPHAGOGASTRODUODENOSCOPY (EGD) WITH PROPOFOL N/A 12/28/2019   Procedure: ESOPHAGOGASTRODUODENOSCOPY (EGD) WITH PROPOFOL;  Surgeon: Corbin Ade, MD;  [facility];  Service: Endoscopy;  Laterality: N/A;  11:00am   FLEXIBLE SIGMOIDOSCOPY N/A 12/05/2021    Procedure: FLEXIBLE SIGMOIDOSCOPY;  Surgeon: Corbin Ade, MD;  [facility];  Service: Endoscopy;  Laterality: N/A;  2:45pm, asa 3   HERNIA REPAIR Right 2019   dr Charleston Ropes at [facility] with mesh   HOT HEMOSTASIS  12/05/2021   Procedure: HOT HEMOSTASIS (ARGON PLASMA COAGULATION/BICAP);  Surgeon: Corbin Ade, MD;  [facility];  Service: Endoscopy;;   INCISIONAL HERNIA REPAIR  09/02/2016   Procedure: HERNIA REPAIR INCISIONAL;  Surgeon: Romie Levee, MD;  [facility];  Service: General;;   LAPAROTOMY N/A 03/28/2016   Procedure: EXPLORATORY LAPAROTOMY, SIGMOID COLECTOMY, COLOSTOMY;  Surgeon: Romie Levee, MD;  [facility];  Service: General;  Laterality: N/A;   MALONEY DILATION N/A 07/02/2016   Procedure: Alvy Beal;  Surgeon: Corbin Ade, MD;  [facility];  Service: Endoscopy;  Laterality: N/A;   MOUTH SURGERY  2019   left lower jaw   PORT-A-CATH REMOVAL  09/02/2016   Procedure: REMOVAL PORT-A-CATH;  Surgeon: Romie Levee, MD;  [facility];  Service: General;;   PROSTATE BIOPSY  2021   RADIOACTIVE SEED IMPLANT N/A 06/28/2020   Procedure: RADIOACTIVE SEED IMPLANT/BRACHYTHERAPY IMPLANT WITH CYSTOSCOPY;  Surgeon: Jannifer Hick, MD;  Location: [facility];  Service: Urology;  Laterality: N/A;   REMOVAL OF GASTROSTOMY TUBE N/A 09/02/2016   Procedure: REMOVAL OF GASTROSTOMY TUBE;  Surgeon: Romie Levee, MD;  [facility];  Service: General;  Laterality: N/A;   SPACE OAR INSTILLATION N/A 06/28/2020   Procedure: SPACE OAR INSTILLATION;  Surgeon: Jannifer Hick, MD;  Location: [facility];  Service: Urology;  Laterality: N/A;   TONSILLECTOMY Left 07/09/2015   Procedure: LEFT TONSILLECTOMY;  Surgeon: Newman Pies, MD;  Location: [facility];  Service: ENT;  Laterality: Left;   TONSILLECTOMY  2017   left tonsil   Z-PLASTY SCAR REVISION N/A 09/02/2016   Procedure: SCAR REVISION;  Surgeon: Romie Levee, MD;   [facility];  Service: General;  Laterality: N/A;    Social History: Social History   Socioeconomic History   Marital status: Single    Spouse name: Not on file   Number of children: 0   Years of education: Not  on file   Highest education level: Not on file  Occupational History   Not on file  Tobacco Use   Smoking status: Some Days    Current packs/day: 0.00    Average packs/day: 0.5 packs/day for 30.0 years (15.0 ttl pk-yrs)    Types: Cigarettes    Start date: 11/23/1986    Last attempt to quit: 11/22/2016    Years since quitting: 6.1   Smokeless tobacco: Never   Tobacco comments:    smoke social occ  Vaping Use   Vaping status: Never Used  Substance and Sexual Activity   Alcohol use: No    Alcohol/week: 0.0 standard drinks of alcohol    Comment: former abuse- quit 09/2011   Drug use: No   Sexual activity: Not Currently  Other Topics Concern   Not on file  Social History Narrative   Divorced.   No children.   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   Worked in [employer].    Fully retired at this time.    Has been smoking off/on since age 61, quit smoking 11/2016.    Exercises minimally   Eats all food groups.   Wears seatbelt.   Mother lives with him, has dementia.   Attends AA meetings.    Social Determinants of Health   Financial Resource Strain: Not on file  Food Insecurity: Low Risk  (12/22/2022)   Received from [facility]   Food vital sign    Within the past 12 months, you worried that your food would run out before you got money to buy more: Never true    Within the past 12 months, the food you bought just didn't last and you didn't have money to get more. : Never true  Transportation Needs: No Transportation Needs (12/22/2022)   Received from [employer]    In the past 12 months, has lack of reliable transportation kept you from medical appointments, meetings, work or from getting things needed  for daily living? : No  Physical Activity: Not on file  Stress: Not on file  Social Connections: Not on file  Intimate Partner Violence: Not At Risk (06/27/2020)   Humiliation, Afraid, Rape, and Kick questionnaire    Fear of Current or Ex-Partner: No    Emotionally Abused: No    Physically Abused: No    Sexually Abused: No    Family History: Family History  Problem Relation Age of Onset   Macular degeneration Mother    Dementia Mother    Alcoholism Father    Prostate cancer Father    Diabetes Maternal Aunt    Diabetes Maternal Uncle    Prostate cancer Maternal Uncle    Mental illness Sister    Pancreatic cancer Neg Hx    Breast cancer Neg Hx    Colon cancer Neg Hx     Current Medications:  Current Outpatient Medications:    aspirin EC 81 MG tablet, Take 1 tablet (81 mg total) by mouth daily. (Patient taking differently: Take 81 mg by mouth 2 (two) times a week.), Disp: , Rfl:    atorvastatin (LIPITOR) 40 MG tablet, Take 1 tablet (40 mg total) by mouth daily. (Patient taking differently: Take 40 mg by mouth at bedtime.), Disp: 90 tablet, Rfl: 0   B Complex Vitamins (VITAMIN-B COMPLEX PO), Take 1 tablet by mouth daily., Disp: , Rfl:    cetirizine (ZYRTEC) 10 MG tablet, Take 10 mg by mouth daily., Disp: ,  Rfl:    chlorhexidine (PERIDEX) 0.12 % solution, Use as directed 15 mLs in the mouth or throat 2 (two) times daily as needed (prevention)., Disp: , Rfl:    Cholecalciferol (VITAMIN D) 50 MCG (2000 UT) tablet, Take 2,000 Units by mouth daily., Disp: , Rfl:    DEXILANT 60 MG capsule, TAKE 1 CAPSULE BY MOUTH DAILY, Disp: 30 capsule, Rfl: 6   docusate sodium (COLACE) 250 MG capsule, Take 250 mg by mouth 2 (two) times daily., Disp: , Rfl:    DULoxetine (CYMBALTA) 60 MG capsule, Take 120 mg by mouth daily., Disp: , Rfl:    fluticasone (FLONASE) 50 MCG/ACT nasal spray, Place 2 sprays into both nostrils daily as needed for allergies., Disp: , Rfl:    GEMTESA 75 MG TABS, Take 75 mg by  mouth daily., Disp: , Rfl:    lisinopril (ZESTRIL) 5 MG tablet, Take 5 mg by mouth daily., Disp: , Rfl:    pantoprazole (PROTONIX) 40 MG tablet, Take 1 tablet (40 mg total) by mouth daily., Disp: 30 tablet, Rfl: 11   pilocarpine (SALAGEN) 5 MG tablet, Take 5 mg by mouth in the morning, at noon, and at bedtime., Disp: , Rfl:    polyethylene glycol-electrolytes (NULYTELY) 420 g solution, As directed, Disp: 4000 mL, Rfl: 0   sodium fluoride (FLUORISHIELD) 1.1 % GEL dental gel, Instill one drop of gel per tooth space of fluoride tray. Place over teeth for 5 minutes. Remove. Spit out excess. Repeat nightly. (Patient taking differently: Place 1 Application onto teeth at bedtime. Instill one drop of gel per tooth space of fluoride tray. Place over teeth for 5 minutes. Remove. Spit out excess. Repeat nightly.), Disp: 125 mL, Rfl: 11   tamsulosin (FLOMAX) 0.4 MG CAPS capsule, Take 0.8 mg by mouth at bedtime., Disp: , Rfl:    triamcinolone (KENALOG) 0.1 % paste, Use as directed 1 Application in the mouth or throat daily as needed (ulcers)., Disp: , Rfl:    triamcinolone cream (KENALOG) 0.1 %, Apply 1 Application topically 2 (two) times daily as needed (eczema)., Disp: , Rfl:    Turmeric 500 MG CAPS, Take 500 mg by mouth in the morning and at bedtime., Disp: , Rfl:  No current facility-administered medications for this visit.  Facility-Administered Medications Ordered in Other Visits:    0.9 %  sodium chloride infusion, , Intravenous, Continuous, Kefalas, Maurine Minister, PA-C, [place] at 03/28/16 1754   0.9 %  sodium chloride infusion, , Intravenous, Continuous, Kefalas, Thomas S, PA-C   0.9 %  sodium chloride infusion, , Intravenous, Continuous, Penland, Shannon K, MD   0.9 %  sodium chloride infusion, , Intravenous, Continuous, Penland, Carollee Herter K, MD   0.9 %  sodium chloride infusion, , Intravenous, Continuous, Penland, Novella Olive, MD   Allergies: Allergies  Allergen Reactions   Bee Venom Swelling and  Anaphylaxis   Other Other (See Comments)    Opthalmic mycin irritates eyes.     Tobramycin Ophthalmic [Tobramycin]     Other reaction(s): DRYNESS, IRRITATION    REVIEW OF SYSTEMS:   Review of Systems  Constitutional:  Negative for chills, fatigue and fever.  HENT:   Positive for trouble swallowing. Negative for lump/mass, mouth sores, nosebleeds and sore throat.   Eyes:  Negative for eye problems.  Respiratory:  Negative for cough and shortness of breath.   Cardiovascular:  Negative for chest pain, leg swelling and palpitations.  Gastrointestinal:  Negative for abdominal pain, constipation, diarrhea, nausea and vomiting.  Genitourinary:  Negative for bladder incontinence, difficulty urinating, dysuria, frequency, hematuria and nocturia.   Musculoskeletal:  Negative for arthralgias, back pain, flank pain, myalgias and neck pain.  Skin:  Negative for itching and rash.  Neurological:  Negative for dizziness, headaches and numbness.  Hematological:  Does not bruise/bleed easily.  Psychiatric/Behavioral:  Negative for depression, sleep disturbance and suicidal ideas. The patient is not nervous/anxious.   All other systems reviewed and are negative.    VITALS:   Blood pressure 126/72, pulse (!) 111, temperature 98.4 F (36.9 C), temperature source Oral, resp. rate 18, height 5\' 9"  (1.753 m), weight 175 lb 6.4 oz (79.6 kg), SpO2 100%.  Wt Readings from Last 3 Encounters:  12/31/22 175 lb 6.4 oz (79.6 kg)  01/01/22 176 lb 3.2 oz (79.9 kg)  12/05/21 175 lb (79.4 kg)    Body mass index is 25.9 kg/m.  Performance status (ECOG): 1 - Symptomatic but completely ambulatory  PHYSICAL EXAM:   Physical Exam Vitals and nursing note reviewed. Exam conducted with a chaperone present.  Constitutional:      Appearance: Normal appearance.  Cardiovascular:     Rate and Rhythm: Normal rate and regular rhythm.     Pulses: Normal pulses.     Heart sounds: Normal heart sounds.  Pulmonary:      Effort: Pulmonary effort is normal.     Breath sounds: Normal breath sounds.  Abdominal:     Palpations: Abdomen is soft. There is no hepatomegaly, splenomegaly or mass.     Tenderness: There is no abdominal tenderness.  Musculoskeletal:     Right lower leg: No edema.     Left lower leg: No edema.  Lymphadenopathy:     Cervical: No cervical adenopathy.     Right cervical: No superficial, deep or posterior cervical adenopathy.    Left cervical: No superficial, deep or posterior cervical adenopathy.     Upper Body:     Right upper body: No supraclavicular or axillary adenopathy.     Left upper body: No supraclavicular or axillary adenopathy.  Neurological:     General: No focal deficit present.     Mental Status: He is alert and oriented to person, place, and time.  Psychiatric:        Mood and Affect: Mood normal.        Behavior: Behavior normal.     LABS:      Latest Ref Rng & Units 12/24/2022    8:59 AM 12/25/2021   12:18 PM 12/02/2021   11:28 AM  CBC  WBC 4.0 - 10.5 K/uL 11.3  6.2  8.2   Hemoglobin 13.0 - 17.0 g/dL 96.0  45.4  09.8   Hematocrit 39.0 - 52.0 % 43.9  36.8  37.7   Platelets 150 - 400 K/uL 261  248  241       Latest Ref Rng & Units 12/24/2022    8:59 AM 12/25/2021   12:18 PM 12/02/2021   11:28 AM  CMP  Glucose 70 - 99 mg/dL 119  147  829   BUN 8 - 23 mg/dL 18  18  14    Creatinine 0.61 - 1.24 mg/dL 5.62  1.30  8.65   Sodium 135 - 145 mmol/L 136  134  132   Potassium 3.5 - 5.1 mmol/L 4.1  4.2  4.4   Chloride 98 - 111 mmol/L 101  102  104   CO2 22 - 32 mmol/L 27  27  20    Calcium 8.9 - 10.3  mg/dL 9.0  8.9  9.1   Total Protein 6.5 - 8.1 g/dL 6.9  7.3  7.2   Total Bilirubin 0.3 - 1.2 mg/dL 0.4  0.5  0.6   Alkaline Phos 38 - 126 U/L 46  55  77   AST 15 - 41 U/L 16  19  19    ALT 0 - 44 U/L 18  17  21       No results found for: "CEA1", "CEA" / No results found for: "CEA1", "CEA" No results found for: "PSA1" No results found for: "WUJ811" No results found  for: "CAN125"  No results found for: "TOTALPROTELP", "ALBUMINELP", "A1GS", "A2GS", "BETS", "BETA2SER", "GAMS", "MSPIKE", "SPEI" Lab Results  Component Value Date   TIBC 410 12/24/2022   TIBC 235 (L) 05/06/2016   FERRITIN 10 (L) 12/24/2022   FERRITIN 303 05/27/2016   FERRITIN 278 05/06/2016   IRONPCTSAT 11 (L) 12/24/2022   IRONPCTSAT 6 (L) 05/06/2016   Lab Results  Component Value Date   LDH 118 12/12/2019     STUDIES:   CT SOFT TISSUE NECK W CONTRAST  Result Date: 12/30/2022 CLINICAL DATA:  Follow-up tonsil cancer. History of chemotherapy and radiation. EXAM: CT NECK WITH CONTRAST TECHNIQUE: Multidetector CT imaging of the neck was performed using the standard protocol following the bolus administration of intravenous contrast. RADIATION DOSE REDUCTION: This exam was performed according to the departmental dose-optimization program which includes automated exposure control, adjustment of the mA and/or kV according to patient size and/or use of iterative reconstruction technique. CONTRAST:  75mL OMNIPAQUE IOHEXOL 300 MG/ML  SOLN COMPARISON:  Neck CT 12/25/2021 FINDINGS: Pharynx and larynx: Prominent motion artifact through the lower oropharynx, hypopharynx, and supraglottic larynx. Suspected mild persistent post radiation changes/edema in this region without a gross mass identified within limitations of motion. Salivary glands: Extensive motion artifact through the submandibular regions. Chronic postradiation changes affecting the submandibular glands and left parotid gland. Thyroid: Unremarkable. Lymph nodes: Unchanged subcentimeter but mildly prominent submental lymph nodes measuring up to 8 mm. No frankly enlarged or suspicious lymph nodes elsewhere in the neck. Vascular: Major vascular structures of the neck are grossly patent. Atherosclerosis at the carotid bifurcations with assessment of this region limited by motion artifact. Limited intracranial: Unremarkable. Visualized orbits: Bilateral  cataract extraction. Mastoids and visualized paranasal sinuses: Clear. Skeleton: No suspicious osseous lesion. Advanced lower cervical disc degeneration. Advanced right facet arthrosis at C3-4. Upper chest: Post radiation fibrosis in the lung apices. Other: None. IMPRESSION: Motion degraded examination without evidence of recurrent disease in the neck. Electronically Signed   By: Sebastian Ache M.D.   On: 12/30/2022 16:15   CT CHEST LUNG CA SCREEN LOW DOSE W/O CM  Result Date: 12/30/2022 CLINICAL DATA:  72 year old male former smoker (quit 1 and half years ago) with 32 pack-year history of smoking. Lung cancer screening examination. Additional history of tonsillar cancer. EXAM: CT CHEST WITHOUT CONTRAST LOW-DOSE FOR LUNG CANCER SCREENING TECHNIQUE: Multidetector CT imaging of the chest was performed following the standard protocol without IV contrast. RADIATION DOSE REDUCTION: This exam was performed according to the departmental dose-optimization program which includes automated exposure control, adjustment of the mA and/or kV according to patient size and/or use of iterative reconstruction technique. COMPARISON:  Low-dose lung cancer screening chest CT 12/25/2021. FINDINGS: Cardiovascular: Heart size is normal. There is no significant pericardial fluid, thickening or pericardial calcification. There is aortic atherosclerosis, as well as atherosclerosis of the great vessels of the mediastinum and the coronary arteries, including calcified atherosclerotic plaque  in the left main, left anterior descending and right coronary arteries. Mediastinum/Nodes: No pathologically enlarged mediastinal or hilar lymph nodes. Please note that accurate exclusion of hilar adenopathy is limited on noncontrast CT scans. Esophagus is unremarkable in appearance. No axillary lymphadenopathy. Lungs/Pleura: Small calcified granuloma in the right lower lobe. No other suspicious appearing pulmonary nodules or masses are noted. No acute  consolidative airspace disease. No pleural effusions. Mild diffuse bronchial wall thickening with mild centrilobular and paraseptal emphysema. Bilateral apical mass-like pleuroparenchymal thickening and architectural distortion, likely chronic postradiation changes in the setting of prior head and neck radiation therapy. Upper Abdomen: Aortic atherosclerosis. Musculoskeletal: There are no aggressive appearing lytic or blastic lesions noted in the visualized portions of the skeleton. IMPRESSION: 1. Lung-RADS 1S, negative. Continue annual screening with low-dose chest CT without contrast in 12 months. 2. The "S" modifier above refers to potentially clinically significant non lung cancer related findings. Specifically, there is aortic atherosclerosis, in addition to left main and 2 vessel coronary artery disease. Please note that although the presence of coronary artery calcium documents the presence of coronary artery disease, the severity of this disease and any potential stenosis cannot be assessed on this non-gated CT examination. Assessment for potential risk factor modification, dietary therapy or pharmacologic therapy may be warranted, if clinically indicated. 3. Mild diffuse bronchial wall thickening with mild centrilobular and paraseptal emphysema; imaging findings suggestive of underlying COPD. Aortic Atherosclerosis (ICD10-I70.0) and Emphysema (ICD10-J43.9). Electronically Signed   By: Trudie Reed M.D.   On: 12/30/2022 12:47

## 2022-12-31 ENCOUNTER — Inpatient Hospital Stay (HOSPITAL_BASED_OUTPATIENT_CLINIC_OR_DEPARTMENT_OTHER): Payer: Medicare Other | Admitting: Hematology

## 2022-12-31 VITALS — BP 126/72 | HR 111 | Temp 98.4°F | Resp 18 | Ht 69.0 in | Wt 175.4 lb

## 2022-12-31 DIAGNOSIS — Z8589 Personal history of malignant neoplasm of other organs and systems: Secondary | ICD-10-CM | POA: Diagnosis not present

## 2022-12-31 DIAGNOSIS — Z08 Encounter for follow-up examination after completed treatment for malignant neoplasm: Secondary | ICD-10-CM | POA: Diagnosis not present

## 2022-12-31 DIAGNOSIS — C099 Malignant neoplasm of tonsil, unspecified: Secondary | ICD-10-CM | POA: Diagnosis not present

## 2022-12-31 DIAGNOSIS — Z87891 Personal history of nicotine dependence: Secondary | ICD-10-CM | POA: Diagnosis not present

## 2022-12-31 DIAGNOSIS — Z85818 Personal history of malignant neoplasm of other sites of lip, oral cavity, and pharynx: Secondary | ICD-10-CM | POA: Diagnosis not present

## 2022-12-31 DIAGNOSIS — E611 Iron deficiency: Secondary | ICD-10-CM | POA: Diagnosis not present

## 2022-12-31 DIAGNOSIS — Z8546 Personal history of malignant neoplasm of prostate: Secondary | ICD-10-CM | POA: Diagnosis not present

## 2022-12-31 DIAGNOSIS — Z923 Personal history of irradiation: Secondary | ICD-10-CM | POA: Diagnosis not present

## 2022-12-31 DIAGNOSIS — Z9221 Personal history of antineoplastic chemotherapy: Secondary | ICD-10-CM | POA: Diagnosis not present

## 2022-12-31 NOTE — Patient Instructions (Addendum)
[facility] at [facility] Discharge Instructions   You were seen and examined today by Dr. Ellin Saba.  He reviewed the results of your lab work which are mostly normal/stable. Your iron levels are low. Restart the iron supplement 3 times a week.   He reviewed the results of your CT scans. There is no evidence of cancer on either scan.   We will see you back in one year. We will repeat lab work and CT scans at that time.   Return as scheduled.    Thank you for choosing [facility] at [facility] to provide your oncology and hematology care.  To afford each patient quality time with our provider, please arrive at least 15 minutes before your scheduled appointment time.   If you have a lab appointment with the Cancer Center please come in thru the Main Entrance and check in at the main information desk.  You need to re-schedule your appointment should you arrive 10 or more minutes late.  We strive to give you quality time with our providers, and arriving late affects you and other patients whose appointments are after yours.  Also, if you no show three or more times for appointments you may be dismissed from the clinic at the providers discretion.     Again, thank you for choosing [facility].  Our hope is that these requests will decrease the amount of time that you wait before being seen by our physicians.       _____________________________________________________________  Should you have questions after your visit to [facility], please contact our office at [phone] and follow the prompts.  Our office hours are 8:00 a.m. and 4:30 p.m. Monday - Friday.  Please note that voicemails left after 4:00 p.m. may not be returned until the following business day.  We are closed weekends and major holidays.  You do have access to a nurse 24-7, just call the main number to the clinic [phone] and do not press any options,  hold on the line and a nurse will answer the phone.    For prescription refill requests, have your pharmacy contact our office and allow 72 hours.    Due to Covid, you will need to wear a mask upon entering the hospital. If you do not have a mask, a mask will be given to you at the Main Entrance upon arrival. For doctor visits, patients may have 1 support person age 28 or older with them. For treatment visits, patients can not have anyone with them due to social distancing guidelines and our immunocompromised population.

## 2023-01-26 DIAGNOSIS — Z85828 Personal history of other malignant neoplasm of skin: Secondary | ICD-10-CM | POA: Diagnosis not present

## 2023-01-26 DIAGNOSIS — Z85820 Personal history of malignant melanoma of skin: Secondary | ICD-10-CM | POA: Diagnosis not present

## 2023-01-26 DIAGNOSIS — D485 Neoplasm of uncertain behavior of skin: Secondary | ICD-10-CM | POA: Diagnosis not present

## 2023-01-26 DIAGNOSIS — L309 Dermatitis, unspecified: Secondary | ICD-10-CM | POA: Diagnosis not present

## 2023-03-27 ENCOUNTER — Other Ambulatory Visit: Payer: Self-pay | Admitting: Internal Medicine

## 2023-04-05 ENCOUNTER — Telehealth (INDEPENDENT_AMBULATORY_CARE_PROVIDER_SITE_OTHER): Payer: Self-pay | Admitting: Otolaryngology

## 2023-04-05 NOTE — Telephone Encounter (Signed)
Patient called to confirm appointment on 11/18 and to confirm correct location.

## 2023-04-12 ENCOUNTER — Encounter (INDEPENDENT_AMBULATORY_CARE_PROVIDER_SITE_OTHER): Payer: Self-pay

## 2023-04-12 ENCOUNTER — Ambulatory Visit (INDEPENDENT_AMBULATORY_CARE_PROVIDER_SITE_OTHER): Payer: Medicare Other | Admitting: Otolaryngology

## 2023-04-12 VITALS — Ht 69.0 in | Wt 175.0 lb

## 2023-04-12 DIAGNOSIS — H6123 Impacted cerumen, bilateral: Secondary | ICD-10-CM | POA: Diagnosis not present

## 2023-04-12 DIAGNOSIS — Z85819 Personal history of malignant neoplasm of unspecified site of lip, oral cavity, and pharynx: Secondary | ICD-10-CM

## 2023-04-12 NOTE — Progress Notes (Signed)
Patient ID: Frank Castillo, male   DOB: [birth year], 72 y.o.   MRN: [id number]  Follow up: Oral cancer, leukoplakia  HPI: The patient is a 72 year old male who returns today for his follow-up evaluation. 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 He was treated with extensive surgery at [facility] in 2019. At his last visit 1 year ago, no mass or lesion was noted on exam. The patient returns today with no oral or throat complaints. He is tolerating oral intake well. No other ENT, GI, or respiratory issue noted since the last visit.   Exam: General: Communicates without difficulty, well nourished, no acute distress. Head: Normocephalic, no evidence injury, no tenderness, facial buttresses intact without stepoff. Eyes: PERRL, EOMI. No scleral icterus, conjunctivae clear. Neuro: CN II exam reveals vision grossly intact.  No nystagmus at any point of gaze.  Ears: Bilateral cerumen impaction.  Nose: External evaluation reveals normal support and skin without lesions.  Dorsum is intact.  Anterior rhinoscopy reveals healthy pink mucosa over anterior aspect of inferior turbinates and intact septum.  No purulence noted. Mouth:  No significant leukoplakia or erythroplakia is noted today.  No suspicious mass or lesion.  Neck: Full range of motion without pain.  There is no significant lymphadenopathy.  Trachea is midline.   Procedure: Bilateral cerumen disimpaction Anesthesia: None Description: Under the operating microscope, the cerumen is carefully removed with a combination of cerumen currette, alligator forceps, and suction catheters.  After the cerumen is removed, the TMs are noted to be normal.  No mass, erythema, or lesions. The patient tolerated the procedure well.    Assessment: 1. No suspicious mass or lesion is noted on today's exam.  2. No significant leukoplakia or erythroplakia is noted today.  3. History of left tonsillar and floor  of mouth cancer.  4. Incidental finding of bilateral cerumen impaction.  Plan: 1.  The physical exam findings are reviewed with the patient.  2.  He is reassured that no suspicious mass or lesion is noted.  3.  Otomicroscopy with bilateral cerumen disimpaction.   4.  The patient will follow up in 12 months, sooner if needed.

## 2023-04-17 DIAGNOSIS — Z23 Encounter for immunization: Secondary | ICD-10-CM | POA: Diagnosis not present

## 2023-05-06 DIAGNOSIS — C61 Malignant neoplasm of prostate: Secondary | ICD-10-CM | POA: Diagnosis not present

## 2023-05-12 DIAGNOSIS — C61 Malignant neoplasm of prostate: Secondary | ICD-10-CM | POA: Diagnosis not present

## 2023-05-12 DIAGNOSIS — N401 Enlarged prostate with lower urinary tract symptoms: Secondary | ICD-10-CM | POA: Diagnosis not present

## 2023-05-12 DIAGNOSIS — N3943 Post-void dribbling: Secondary | ICD-10-CM | POA: Diagnosis not present

## 2023-06-28 LAB — MOLECULAR PATHOLOGY

## 2023-07-01 ENCOUNTER — Telehealth: Payer: Self-pay | Admitting: Internal Medicine

## 2023-07-01 NOTE — Telephone Encounter (Signed)
 Dr. Riley Cheadle refused a refill until he is seen in the office.

## 2023-07-01 NOTE — Telephone Encounter (Signed)
 Pt called asking for a refill on his protonix  and he uses [facility]. He is aware of his OV with [facility] on Tuesday next week.

## 2023-07-06 ENCOUNTER — Ambulatory Visit (INDEPENDENT_AMBULATORY_CARE_PROVIDER_SITE_OTHER): Payer: Medicare Other | Admitting: Internal Medicine

## 2023-07-06 ENCOUNTER — Encounter: Payer: Self-pay | Admitting: Internal Medicine

## 2023-07-06 VITALS — BP 132/68 | HR 90 | Temp 98.0°F | Ht 69.0 in | Wt 182.6 lb

## 2023-07-06 DIAGNOSIS — K7460 Unspecified cirrhosis of liver: Secondary | ICD-10-CM

## 2023-07-06 DIAGNOSIS — K7030 Alcoholic cirrhosis of liver without ascites: Secondary | ICD-10-CM

## 2023-07-06 DIAGNOSIS — K219 Gastro-esophageal reflux disease without esophagitis: Secondary | ICD-10-CM | POA: Diagnosis not present

## 2023-07-06 DIAGNOSIS — F1091 Alcohol use, unspecified, in remission: Secondary | ICD-10-CM | POA: Diagnosis not present

## 2023-07-06 MED ORDER — PANTOPRAZOLE SODIUM 40 MG PO TBEC: 40.0000 mg | DELAYED_RELEASE_TABLET | Freq: Every day | ORAL | 11 refills | Status: DC

## 2023-07-06 NOTE — Progress Notes (Unsigned)
Primary Care Physician:  Aliene Beams, MD Primary Gastroenterologist:  Dr. Jena Gauss  Pre-Procedure History & Physical: HPI:  Frank Castillo is a 73 y.o. male here for follow-up.  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  Along the way, patient has history of head neck cancer, prostate, melanoma.  Chronic iron deficiency anemia.  Followed by Dr. Ellin Saba. 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  Denies any rectal bleeding or melena now. Patient is here primarily to pursue refills on Protonix.  Historically takes it once daily with excellent control of reflux symptoms.  No history of Barrett's esophagus. Has perception he has difficulty initiating swallowing which has been chronic since his therapy for head neck cancer. Hepatitis A and B vaccine completed.  Overdue for hepatoma screening.  Due for routine colonoscopy 2028. Distant history of sigmoid colectomy for colon rupture and colostomy with subsequent takedown.  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 Suszanne Conners, 06/2015   Iron deficiency anemia 05/07/2016   Melanoma (HCC) 1994   right calf   Mixed hyperlipidemia    Portal hypertension (HCC)    Prostate cancer (HCC)    Tonsillar cancer (HCC) 06/2015   SCC chemo and radiation done   Wears contact lenses    in left eye    Past Surgical History:  Procedure Laterality Date   BIOPSY  07/02/2016   Procedure: BIOPSY;  Surgeon: Corbin Ade, MD;  [facility];  Service: Endoscopy;;  esophageal   BIOPSY  12/05/2021   Procedure:  BIOPSY;  Surgeon: Corbin Ade, MD;  [facility];  Service: Endoscopy;;   COLON SURGERY  2017   ruptured colon   COLONOSCOPY WITH PROPOFOL N/A 07/02/2016   Procedure: COLONOSCOPY WITH PROPOFOL;  Surgeon: Corbin Ade, MD;  [facility];  Service: Endoscopy;  Laterality: N/A;  9:45am   COLOSTOMY TAKEDOWN N/A 09/02/2016   Procedure: LAPAROSCOPIC COLOSTOMY REVERSAL;  Surgeon: Romie Levee, MD;  Location: [facility];  Service: General;  Laterality: N/A;   ESOPHAGOGASTRODUODENOSCOPY (EGD) WITH PROPOFOL N/A 07/02/2016   Procedure: ESOPHAGOGASTRODUODENOSCOPY (EGD) WITH PROPOFOL;  Surgeon: Corbin Ade, MD;  [facility];  Service: Endoscopy;  Laterality: N/A;   ESOPHAGOGASTRODUODENOSCOPY (EGD) WITH PROPOFOL N/A 12/28/2019   Procedure: ESOPHAGOGASTRODUODENOSCOPY (EGD) WITH PROPOFOL;  Surgeon: Corbin Ade, MD;  [facility];  Service: Endoscopy;  Laterality: N/A;  11:00am   FLEXIBLE SIGMOIDOSCOPY N/A 12/05/2021   Procedure: FLEXIBLE SIGMOIDOSCOPY;  Surgeon: Corbin Ade, MD;  [facility];  Service: Endoscopy;  Laterality: N/A;  2:45pm, asa 3   HERNIA REPAIR Right 2019   dr Charleston Ropes at [facility] with mesh   HOT HEMOSTASIS  12/05/2021   Procedure: HOT HEMOSTASIS (ARGON PLASMA COAGULATION/BICAP);  Surgeon: Corbin Ade, MD;  [facility];  Service: Endoscopy;;   INCISIONAL HERNIA REPAIR  09/02/2016   Procedure: HERNIA REPAIR INCISIONAL;  Surgeon: Romie Levee, MD;  [facility];  Service: General;;   LAPAROTOMY N/A 03/28/2016   Procedure: EXPLORATORY LAPAROTOMY, SIGMOID COLECTOMY, COLOSTOMY;  Surgeon: Romie Levee, MD;  [facility];  Service: General;  Laterality: N/A;   MALONEY DILATION N/A 07/02/2016   Procedure: Alvy Beal;  Surgeon: Corbin Ade, MD;  [facility];  Service: Endoscopy;  Laterality: N/A;   MOUTH SURGERY  2019   left lower jaw   PORT-A-CATH REMOVAL  09/02/2016   Procedure: REMOVAL  PORT-A-CATH;  Surgeon: Romie Levee, MD;  [facility];  Service: General;;   PROSTATE BIOPSY  2021   RADIOACTIVE SEED IMPLANT N/A 06/28/2020   Procedure: RADIOACTIVE SEED IMPLANT/BRACHYTHERAPY IMPLANT WITH CYSTOSCOPY;  Surgeon: Jannifer Hick, MD;  Location: [facility];  Service: Urology;  Laterality: N/A;   REMOVAL OF GASTROSTOMY TUBE N/A 09/02/2016   Procedure: REMOVAL OF GASTROSTOMY TUBE;  Surgeon: Romie Levee, MD;  [facility];  Service: General;  Laterality: N/A;   SPACE OAR INSTILLATION N/A 06/28/2020   Procedure: SPACE OAR INSTILLATION;  Surgeon: Jannifer Hick, MD;  Location[facility];  Service: Urology;  Laterality: N/A;   TONSILLECTOMY Left 07/09/2015   Procedure: LEFT TONSILLECTOMY;  Surgeon: Newman Pies, MD;  Location: [facility];  Service: ENT;  Laterality: Left;   TONSILLECTOMY  2017   left tonsil   Z-PLASTY SCAR REVISION N/A 09/02/2016   Procedure: SCAR REVISION;  Surgeon: Romie Levee, MD;  [facility];  Service: General;  Laterality: N/A;    Prior to Admission medications   Medication Sig Start Date End Date Taking? Authorizing Provider  aspirin EC 81 MG tablet Take 1 tablet (81 mg total) by mouth daily. Patient taking differently: Take 81 mg by mouth 2 (two) times a week. 06/17/17  Yes Aliene Beams, MD  atorvastatin (LIPITOR) 40 MG tablet Take 1 tablet (40 mg total) by mouth daily. Patient taking differently: Take 40 mg by mouth at bedtime. 11/29/17  Yes Hagler, Fleet Contras, MD  cetirizine (ZYRTEC) 10 MG tablet Take 10 mg by mouth daily.   Yes [provider]  Cholecalciferol (VITAMIN D) 50 MCG (2000 UT) tablet Take 2,000 Units by mouth daily.   Yes [provider]  DULoxetine (CYMBALTA) 60 MG capsule Take 120 mg by mouth daily. 11/03/18  Yes [provider]  ferrous sulfate 325 (65 FE) MG EC tablet Take 325 mg by mouth daily with breakfast.   Yes [provider]  lisinopril (ZESTRIL) 5  MG tablet Take 5 mg by mouth daily. 05/20/17  Yes [provider]  Magnesium 250 MG CAPS Take 250 mg by mouth daily.   Yes [provider]  pantoprazole (PROTONIX) 40 MG tablet Take 1 tablet (40 mg total) by mouth daily. 05/22/22  Yes Cuahutemoc Attar, Gerrit Friends, MD  sodium fluoride (FLUORISHIELD) 1.1 % GEL dental gel Instill one drop of gel per tooth space of fluoride tray. Place over teeth for 5 minutes. Remove. Spit out excess. Repeat nightly. Patient taking differently: Place 1 Application onto teeth at bedtime. Instill one drop of gel per tooth space of fluoride tray. Place over teeth for 5 minutes. Remove. Spit out excess. Repeat nightly. 03/16/17  Yes Hagler, Fleet Contras, MD  tamsulosin (FLOMAX) 0.4 MG CAPS capsule Take 0.8 mg by mouth at bedtime. 11/18/20  Yes [provider]  triamcinolone cream (KENALOG) 0.1 % Apply 1 Application topically 2 (two) times daily as needed (eczema).   Yes [provider]  Turmeric 500 MG CAPS Take 500 mg  by mouth in the morning and at bedtime.   Yes [provider]    Allergies as of 07/06/2023 - Review Complete 07/06/2023  Allergen Reaction Noted   Bee venom Swelling and Anaphylaxis 07/31/2015   Other Other (See Comments) 11/28/2015   Tobramycin ophthalmic [tobramycin]  06/12/2020    Family History  Problem Relation Age of Onset   Macular degeneration Mother    Dementia Mother    Alcoholism Father    Prostate cancer Father    Diabetes Maternal Aunt    Diabetes Maternal Uncle    Prostate cancer Maternal Uncle    Mental illness Sister    Pancreatic cancer Neg Hx    Breast cancer Neg Hx    Colon cancer Neg Hx     Social History   Socioeconomic History   Marital status: Single    Spouse name: Not on file   Number of children: 0   Years of education: Not on file   Highest education level: Not on file  Occupational History   Not on file  Tobacco Use   Smoking status: Some Days    Current packs/day: 0.00     Average packs/day: 0.5 packs/day for 30.0 years (15.0 ttl pk-yrs)    Types: Cigarettes    Start date: 11/23/1986    Last attempt to quit: 11/22/2016    Years since quitting: 6.6   Smokeless tobacco: Never   Tobacco comments:    smoke social occ  Vaping Use   Vaping status: Never Used  Substance and Sexual Activity   Alcohol use: No    Alcohol/week: 0.0 standard drinks of alcohol    Comment: former abuse- quit 09/2011   Drug use: No   Sexual activity: Not Currently  Other Topics Concern   Not on file  Social History Narrative   Divorced.   No children.   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   Worked in Youth worker.    Fully retired at this time.    Has been smoking off/on since age 89, quit smoking 11/2016.    Exercises minimally   Eats all food groups.   Wears seatbelt.   Mother lives with him, has dementia.   Attends AA meetings.    Social Drivers of Corporate investment banker Strain: Not on file  Food Insecurity: Low Risk  (12/22/2022)   Received from [facility]   Hunger Vital Sign    Worried About Running Out of Food in the Last Year: Never true    Ran Out of Food in the Last Year: Never true  Transportation Needs: No Transportation Needs (12/22/2022)   Received from [facility]    In the past 12 months, has lack of reliable transportation kept you from medical appointments, meetings, work or from getting things needed for daily living? : No  Physical Activity: Not on file  Stress: Not on file  Social Connections: Not on file  Intimate Partner Violence: Not At Risk (06/27/2020)   Humiliation, Afraid, Rape, and Kick questionnaire    Fear of Current or Ex-Partner: No    Emotionally Abused: No    Physically Abused: No    Sexually Abused: No    Review of Systems: See HPI, otherwise negative ROS  Physical Exam: BP 132/68 (BP Location: Right Arm, Patient Position: Sitting, Cuff Size: Normal)   Pulse 90   Temp  98 F (36.7 C) (Oral)   Ht 5\' 9"  (1.753 m)  Wt 182 lb 9.6 oz (82.8 kg)   SpO2 98%   BMI 26.97 kg/m  General:   Alert,  Well-developed, well-nourished, pleasant and cooperative in NAD Heart:  Regular rate and rhythm; no murmurs, clicks, rubs,  or gallops. Abdomen: Distended.  Positive bowel sounds soft and nontender liver edge is palpable at the right costal margin pulses:  Normal pulses noted. Extremities:  Without clubbing or edema.  Impression/Plan: Very pleasant 73 year old gentleman with a history of multiple non-GI cancers.  Well compensated EtOH related cirrhosis long-term sobriety.  Needs updated cirrhosis care.  GERD historically well-controlled on once daily PPI.  Has chronic mild oropharyngeal dysphagia that goes back to treatment for head neck cancer.  No new symptoms.  Recommendations:  We need to update evaluation of your liver would labs (c-Met, CBC, INR, AFP)  It is recommended you have a liver ultrasound every 6 months.  You need to get one now.  We will schedule.  Refill Protonix 40 mg pill dispense 90 with 3 refills.  Take 1 daily 30 minutes before breakfast.  Congratulations on long-term sobriety!  You will be due for a screening colonoscopy 2028  Further recommendations to follow.   Notice: This dictation was prepared with Dragon dictation along with smaller phrase technology. Any transcriptional errors that result from this process are unintentional and may not be corrected upon review.

## 2023-07-06 NOTE — Patient Instructions (Addendum)
It was very nice to see you again today!  We need to update evaluation of your liver would labs (c-Met, CBC, INR, AFP)  It is recommended you have a liver ultrasound every 6 months.  You need to get one now.  We will schedule.  Refill Protonix 40 mg pill dispense 90 with 3 refills.  Take 1 daily 30 minutes before breakfast.  Congratulations on long-term sobriety!  You will be due for a screening colonoscopy 2028  Further recommendations to follow.

## 2023-07-12 ENCOUNTER — Other Ambulatory Visit: Payer: Self-pay

## 2023-07-12 MED ORDER — PANTOPRAZOLE SODIUM 40 MG PO TBEC: 40.0000 mg | DELAYED_RELEASE_TABLET | Freq: Every day | ORAL | 3 refills | Status: AC

## 2023-07-14 ENCOUNTER — Ambulatory Visit (HOSPITAL_COMMUNITY)
Admission: RE | Admit: 2023-07-14 | Discharge: 2023-07-14 | Disposition: A | Discharge status: Home / Self Care | Payer: Medicare Other | Source: Ambulatory Visit | Attending: Internal Medicine | Admitting: Internal Medicine

## 2023-07-14 DIAGNOSIS — K7460 Unspecified cirrhosis of liver: Secondary | ICD-10-CM | POA: Insufficient documentation

## 2023-07-14 DIAGNOSIS — K8020 Calculus of gallbladder without cholecystitis without obstruction: Secondary | ICD-10-CM | POA: Diagnosis not present

## 2023-07-14 DIAGNOSIS — C220 Liver cell carcinoma: Secondary | ICD-10-CM | POA: Diagnosis not present

## 2023-07-14 DIAGNOSIS — K838 Other specified diseases of biliary tract: Secondary | ICD-10-CM | POA: Diagnosis not present

## 2023-07-15 LAB — CBC WITH DIFFERENTIAL/PLATELET
Absolute Lymphocytes: 2414 {cells}/uL (ref 850–3900)
Absolute Monocytes: 449 {cells}/uL (ref 200–950)
Basophils Absolute: 109 {cells}/uL (ref 0–200)
Basophils Relative: 1.6 %
Eosinophils Absolute: 197 {cells}/uL (ref 15–500)
Eosinophils Relative: 2.9 %
HCT: 41.8 % (ref 38.5–50.0)
Hemoglobin: 13.3 g/dL (ref 13.2–17.1)
MCH: 28.4 pg (ref 27.0–33.0)
MCHC: 31.8 g/dL — ABNORMAL LOW (ref 32.0–36.0)
MCV: 89.3 fL (ref 80.0–100.0)
MPV: 9.8 fL (ref 7.5–12.5)
Monocytes Relative: 6.6 %
Neutro Abs: 3631 {cells}/uL (ref 1500–7800)
Neutrophils Relative %: 53.4 %
Platelets: 268 10*3/uL (ref 140–400)
RBC: 4.68 10*6/uL (ref 4.20–5.80)
RDW: 14 % (ref 11.0–15.0)
Total Lymphocyte: 35.5 %
WBC: 6.8 10*3/uL (ref 3.8–10.8)

## 2023-07-15 LAB — COMPREHENSIVE METABOLIC PANEL
AG Ratio: 1.8 (calc) (ref 1.0–2.5)
ALT: 14 U/L (ref 9–46)
AST: 16 U/L (ref 10–35)
Albumin: 4.5 g/dL (ref 3.6–5.1)
Alkaline phosphatase (APISO): 45 U/L (ref 35–144)
BUN: 15 mg/dL (ref 7–25)
CO2: 29 mmol/L (ref 20–32)
Calcium: 9.6 mg/dL (ref 8.6–10.3)
Chloride: 105 mmol/L (ref 98–110)
Creat: 0.76 mg/dL (ref 0.70–1.28)
Globulin: 2.5 g/dL (ref 1.9–3.7)
Glucose, Bld: 139 mg/dL — ABNORMAL HIGH (ref 65–99)
Potassium: 4.5 mmol/L (ref 3.5–5.3)
Sodium: 140 mmol/L (ref 135–146)
Total Bilirubin: 0.3 mg/dL (ref 0.2–1.2)
Total Protein: 7 g/dL (ref 6.1–8.1)

## 2023-07-15 LAB — PROTIME-INR
INR: 1
Prothrombin Time: 10.9 s (ref 9.0–11.5)

## 2023-07-15 LAB — AFP TUMOR MARKER: AFP-Tumor Marker: 2.5 ng/mL (ref <6.1)

## 2023-07-26 DIAGNOSIS — L570 Actinic keratosis: Secondary | ICD-10-CM | POA: Diagnosis not present

## 2023-07-26 DIAGNOSIS — L309 Dermatitis, unspecified: Secondary | ICD-10-CM | POA: Diagnosis not present

## 2023-07-26 DIAGNOSIS — Z85820 Personal history of malignant melanoma of skin: Secondary | ICD-10-CM | POA: Diagnosis not present

## 2023-07-26 DIAGNOSIS — Z85828 Personal history of other malignant neoplasm of skin: Secondary | ICD-10-CM | POA: Diagnosis not present

## 2023-09-10 DIAGNOSIS — I10 Essential (primary) hypertension: Secondary | ICD-10-CM | POA: Diagnosis not present

## 2023-09-10 DIAGNOSIS — E118 Type 2 diabetes mellitus with unspecified complications: Secondary | ICD-10-CM | POA: Diagnosis not present

## 2023-09-10 DIAGNOSIS — J439 Emphysema, unspecified: Secondary | ICD-10-CM | POA: Diagnosis not present

## 2023-09-10 DIAGNOSIS — I700 Atherosclerosis of aorta: Secondary | ICD-10-CM | POA: Diagnosis not present

## 2023-09-22 DIAGNOSIS — E118 Type 2 diabetes mellitus with unspecified complications: Secondary | ICD-10-CM | POA: Diagnosis not present

## 2023-09-22 DIAGNOSIS — F321 Major depressive disorder, single episode, moderate: Secondary | ICD-10-CM | POA: Diagnosis not present

## 2023-09-22 DIAGNOSIS — J439 Emphysema, unspecified: Secondary | ICD-10-CM | POA: Diagnosis not present

## 2023-09-22 DIAGNOSIS — I700 Atherosclerosis of aorta: Secondary | ICD-10-CM | POA: Diagnosis not present

## 2023-09-22 DIAGNOSIS — I10 Essential (primary) hypertension: Secondary | ICD-10-CM | POA: Diagnosis not present

## 2023-09-29 DIAGNOSIS — Z85818 Personal history of malignant neoplasm of other sites of lip, oral cavity, and pharynx: Secondary | ICD-10-CM | POA: Diagnosis not present

## 2023-09-29 DIAGNOSIS — I10 Essential (primary) hypertension: Secondary | ICD-10-CM | POA: Diagnosis not present

## 2023-09-29 DIAGNOSIS — R519 Headache, unspecified: Secondary | ICD-10-CM | POA: Diagnosis not present

## 2023-09-29 DIAGNOSIS — Z8546 Personal history of malignant neoplasm of prostate: Secondary | ICD-10-CM | POA: Diagnosis not present

## 2023-09-30 ENCOUNTER — Other Ambulatory Visit: Payer: Self-pay | Admitting: Family Medicine

## 2023-09-30 DIAGNOSIS — R519 Headache, unspecified: Secondary | ICD-10-CM

## 2023-10-04 ENCOUNTER — Other Ambulatory Visit (HOSPITAL_COMMUNITY): Payer: Self-pay | Admitting: Family Medicine

## 2023-10-04 DIAGNOSIS — R519 Headache, unspecified: Secondary | ICD-10-CM

## 2023-10-05 ENCOUNTER — Other Ambulatory Visit

## 2023-10-11 DIAGNOSIS — I10 Essential (primary) hypertension: Secondary | ICD-10-CM | POA: Diagnosis not present

## 2023-10-11 DIAGNOSIS — J439 Emphysema, unspecified: Secondary | ICD-10-CM | POA: Diagnosis not present

## 2023-10-11 DIAGNOSIS — I700 Atherosclerosis of aorta: Secondary | ICD-10-CM | POA: Diagnosis not present

## 2023-10-11 DIAGNOSIS — E118 Type 2 diabetes mellitus with unspecified complications: Secondary | ICD-10-CM | POA: Diagnosis not present

## 2023-11-03 ENCOUNTER — Ambulatory Visit (HOSPITAL_COMMUNITY)
Admission: RE | Admit: 2023-11-03 | Discharge: 2023-11-03 | Disposition: A | Discharge status: Home / Self Care | Source: Ambulatory Visit | Attending: Family Medicine | Admitting: Family Medicine

## 2023-11-03 DIAGNOSIS — R519 Headache, unspecified: Secondary | ICD-10-CM | POA: Diagnosis not present

## 2023-11-03 DIAGNOSIS — I6381 Other cerebral infarction due to occlusion or stenosis of small artery: Secondary | ICD-10-CM | POA: Diagnosis not present

## 2023-11-03 MED ORDER — IOHEXOL 300 MG/ML  SOLN
75.0000 mL | Freq: Once | INTRAMUSCULAR | Status: AC | PRN
  Administered 2023-11-03: 75 mL via INTRAVENOUS

## 2023-11-04 DIAGNOSIS — H43813 Vitreous degeneration, bilateral: Secondary | ICD-10-CM | POA: Diagnosis not present

## 2023-11-04 DIAGNOSIS — Z961 Presence of intraocular lens: Secondary | ICD-10-CM | POA: Diagnosis not present

## 2023-11-04 DIAGNOSIS — H35372 Puckering of macula, left eye: Secondary | ICD-10-CM | POA: Diagnosis not present

## 2023-11-10 DIAGNOSIS — I700 Atherosclerosis of aorta: Secondary | ICD-10-CM | POA: Diagnosis not present

## 2023-11-10 DIAGNOSIS — E118 Type 2 diabetes mellitus with unspecified complications: Secondary | ICD-10-CM | POA: Diagnosis not present

## 2023-11-10 DIAGNOSIS — J439 Emphysema, unspecified: Secondary | ICD-10-CM | POA: Diagnosis not present

## 2023-11-10 DIAGNOSIS — I10 Essential (primary) hypertension: Secondary | ICD-10-CM | POA: Diagnosis not present

## 2023-11-15 DIAGNOSIS — H35373 Puckering of macula, bilateral: Secondary | ICD-10-CM | POA: Diagnosis not present

## 2023-11-15 DIAGNOSIS — H3581 Retinal edema: Secondary | ICD-10-CM | POA: Diagnosis not present

## 2023-11-15 DIAGNOSIS — H43392 Other vitreous opacities, left eye: Secondary | ICD-10-CM | POA: Diagnosis not present

## 2023-11-15 DIAGNOSIS — H43813 Vitreous degeneration, bilateral: Secondary | ICD-10-CM | POA: Diagnosis not present

## 2023-11-22 DIAGNOSIS — E118 Type 2 diabetes mellitus with unspecified complications: Secondary | ICD-10-CM | POA: Diagnosis not present

## 2023-11-22 DIAGNOSIS — J439 Emphysema, unspecified: Secondary | ICD-10-CM | POA: Diagnosis not present

## 2023-11-22 DIAGNOSIS — F321 Major depressive disorder, single episode, moderate: Secondary | ICD-10-CM | POA: Diagnosis not present

## 2023-11-22 DIAGNOSIS — I10 Essential (primary) hypertension: Secondary | ICD-10-CM | POA: Diagnosis not present

## 2023-11-22 DIAGNOSIS — I700 Atherosclerosis of aorta: Secondary | ICD-10-CM | POA: Diagnosis not present

## 2023-11-29 ENCOUNTER — Encounter: Payer: Self-pay | Admitting: Internal Medicine

## 2023-12-03 DIAGNOSIS — C61 Malignant neoplasm of prostate: Secondary | ICD-10-CM | POA: Diagnosis not present

## 2023-12-10 DIAGNOSIS — D649 Anemia, unspecified: Secondary | ICD-10-CM | POA: Diagnosis not present

## 2023-12-10 DIAGNOSIS — F17210 Nicotine dependence, cigarettes, uncomplicated: Secondary | ICD-10-CM | POA: Diagnosis not present

## 2023-12-10 DIAGNOSIS — I2510 Atherosclerotic heart disease of native coronary artery without angina pectoris: Secondary | ICD-10-CM | POA: Diagnosis not present

## 2023-12-10 DIAGNOSIS — Z720 Tobacco use: Secondary | ICD-10-CM | POA: Diagnosis not present

## 2023-12-10 DIAGNOSIS — E782 Mixed hyperlipidemia: Secondary | ICD-10-CM | POA: Diagnosis not present

## 2023-12-10 DIAGNOSIS — J439 Emphysema, unspecified: Secondary | ICD-10-CM | POA: Diagnosis not present

## 2023-12-10 DIAGNOSIS — I700 Atherosclerosis of aorta: Secondary | ICD-10-CM | POA: Diagnosis not present

## 2023-12-10 DIAGNOSIS — Z8673 Personal history of transient ischemic attack (TIA), and cerebral infarction without residual deficits: Secondary | ICD-10-CM | POA: Diagnosis not present

## 2023-12-10 DIAGNOSIS — Z122 Encounter for screening for malignant neoplasm of respiratory organs: Secondary | ICD-10-CM | POA: Diagnosis not present

## 2023-12-10 DIAGNOSIS — Z0000 Encounter for general adult medical examination without abnormal findings: Secondary | ICD-10-CM | POA: Diagnosis not present

## 2023-12-10 DIAGNOSIS — F339 Major depressive disorder, recurrent, unspecified: Secondary | ICD-10-CM | POA: Diagnosis not present

## 2023-12-10 DIAGNOSIS — I10 Essential (primary) hypertension: Secondary | ICD-10-CM | POA: Diagnosis not present

## 2023-12-10 DIAGNOSIS — E1169 Type 2 diabetes mellitus with other specified complication: Secondary | ICD-10-CM | POA: Diagnosis not present

## 2023-12-10 DIAGNOSIS — E118 Type 2 diabetes mellitus with unspecified complications: Secondary | ICD-10-CM | POA: Diagnosis not present

## 2023-12-10 DIAGNOSIS — K7030 Alcoholic cirrhosis of liver without ascites: Secondary | ICD-10-CM | POA: Diagnosis not present

## 2023-12-16 DIAGNOSIS — H35372 Puckering of macula, left eye: Secondary | ICD-10-CM | POA: Diagnosis not present

## 2023-12-16 DIAGNOSIS — H3581 Retinal edema: Secondary | ICD-10-CM | POA: Diagnosis not present

## 2023-12-23 DIAGNOSIS — E118 Type 2 diabetes mellitus with unspecified complications: Secondary | ICD-10-CM | POA: Diagnosis not present

## 2023-12-23 DIAGNOSIS — I700 Atherosclerosis of aorta: Secondary | ICD-10-CM | POA: Diagnosis not present

## 2023-12-23 DIAGNOSIS — F321 Major depressive disorder, single episode, moderate: Secondary | ICD-10-CM | POA: Diagnosis not present

## 2023-12-23 DIAGNOSIS — I10 Essential (primary) hypertension: Secondary | ICD-10-CM | POA: Diagnosis not present

## 2023-12-23 DIAGNOSIS — J439 Emphysema, unspecified: Secondary | ICD-10-CM | POA: Diagnosis not present

## 2023-12-24 DIAGNOSIS — Z9889 Other specified postprocedural states: Secondary | ICD-10-CM | POA: Diagnosis not present

## 2023-12-24 DIAGNOSIS — H35373 Puckering of macula, bilateral: Secondary | ICD-10-CM | POA: Diagnosis not present

## 2023-12-28 ENCOUNTER — Ambulatory Visit (HOSPITAL_COMMUNITY)
Admission: RE | Admit: 2023-12-28 | Discharge: 2023-12-28 | Disposition: A | Discharge status: Home / Self Care | Payer: Medicare Other | Source: Ambulatory Visit | Attending: Hematology | Admitting: Hematology

## 2023-12-28 ENCOUNTER — Inpatient Hospital Stay: Payer: Medicare Other | Attending: Hematology

## 2023-12-28 DIAGNOSIS — C099 Malignant neoplasm of tonsil, unspecified: Secondary | ICD-10-CM

## 2023-12-28 DIAGNOSIS — Z85818 Personal history of malignant neoplasm of other sites of lip, oral cavity, and pharynx: Secondary | ICD-10-CM | POA: Diagnosis not present

## 2023-12-28 DIAGNOSIS — Z7982 Long term (current) use of aspirin: Secondary | ICD-10-CM | POA: Insufficient documentation

## 2023-12-28 DIAGNOSIS — Z8546 Personal history of malignant neoplasm of prostate: Secondary | ICD-10-CM | POA: Insufficient documentation

## 2023-12-28 DIAGNOSIS — Z79899 Other long term (current) drug therapy: Secondary | ICD-10-CM | POA: Diagnosis not present

## 2023-12-28 DIAGNOSIS — Z923 Personal history of irradiation: Secondary | ICD-10-CM | POA: Insufficient documentation

## 2023-12-28 DIAGNOSIS — Z8042 Family history of malignant neoplasm of prostate: Secondary | ICD-10-CM | POA: Diagnosis not present

## 2023-12-28 DIAGNOSIS — D509 Iron deficiency anemia, unspecified: Secondary | ICD-10-CM | POA: Insufficient documentation

## 2023-12-28 DIAGNOSIS — Z85820 Personal history of malignant melanoma of skin: Secondary | ICD-10-CM | POA: Insufficient documentation

## 2023-12-28 DIAGNOSIS — Z08 Encounter for follow-up examination after completed treatment for malignant neoplasm: Secondary | ICD-10-CM

## 2023-12-28 DIAGNOSIS — Z9221 Personal history of antineoplastic chemotherapy: Secondary | ICD-10-CM | POA: Insufficient documentation

## 2023-12-28 DIAGNOSIS — Z87891 Personal history of nicotine dependence: Secondary | ICD-10-CM | POA: Insufficient documentation

## 2023-12-28 LAB — CBC WITH DIFFERENTIAL/PLATELET
Abs Immature Granulocytes: 0.06 K/uL (ref 0.00–0.07)
Basophils Absolute: 0.1 K/uL (ref 0.0–0.1)
Basophils Relative: 2 %
Eosinophils Absolute: 0.2 K/uL (ref 0.0–0.5)
Eosinophils Relative: 3 %
HCT: 47 % (ref 39.0–52.0)
Hemoglobin: 14.9 g/dL (ref 13.0–17.0)
Immature Granulocytes: 1 %
Lymphocytes Relative: 34 %
Lymphs Abs: 2.7 K/uL (ref 0.7–4.0)
MCH: 28.8 pg (ref 26.0–34.0)
MCHC: 31.7 g/dL (ref 30.0–36.0)
MCV: 90.9 fL (ref 80.0–100.0)
Monocytes Absolute: 0.5 K/uL (ref 0.1–1.0)
Monocytes Relative: 6 %
Neutro Abs: 4.3 K/uL (ref 1.7–7.7)
Neutrophils Relative %: 54 %
Platelets: 237 K/uL (ref 150–400)
RBC: 5.17 MIL/uL (ref 4.22–5.81)
RDW: 14.6 % (ref 11.5–15.5)
WBC: 7.8 K/uL (ref 4.0–10.5)
nRBC: 0 % (ref 0.0–0.2)

## 2023-12-28 LAB — COMPREHENSIVE METABOLIC PANEL WITH GFR
ALT: 14 U/L (ref 0–44)
AST: 14 U/L — ABNORMAL LOW (ref 15–41)
Albumin: 4.1 g/dL (ref 3.5–5.0)
Alkaline Phosphatase: 40 U/L (ref 38–126)
Anion gap: 11 (ref 5–15)
BUN: 21 mg/dL (ref 8–23)
CO2: 25 mmol/L (ref 22–32)
Calcium: 9.8 mg/dL (ref 8.9–10.3)
Chloride: 102 mmol/L (ref 98–111)
Creatinine, Ser: 1.02 mg/dL (ref 0.61–1.24)
GFR, Estimated: >60 mL/min (ref >60)
Glucose, Bld: 141 mg/dL — ABNORMAL HIGH (ref 70–99)
Potassium: 5.1 mmol/L (ref 3.5–5.1)
Sodium: 138 mmol/L (ref 135–145)
Total Bilirubin: 0.6 mg/dL (ref 0.0–1.2)
Total Protein: 7 g/dL (ref 6.5–8.1)

## 2023-12-28 LAB — TSH: TSH: 6.847 u[IU]/mL — ABNORMAL HIGH (ref 0.350–4.500)

## 2023-12-28 LAB — IRON AND TIBC
Iron: 90 ug/dL (ref 45–182)
Saturation Ratios: 20 % (ref 17.9–39.5)
TIBC: 452 ug/dL — ABNORMAL HIGH (ref 250–450)
UIBC: 362 ug/dL

## 2023-12-28 LAB — FERRITIN: Ferritin: 16 ng/mL — ABNORMAL LOW (ref 24–336)

## 2023-12-28 MED ORDER — IOHEXOL 300 MG/ML  SOLN
75.0000 mL | Freq: Once | INTRAMUSCULAR | Status: AC | PRN
  Administered 2023-12-28: 75 mL via INTRAVENOUS

## 2024-01-03 NOTE — Progress Notes (Signed)
 [facility] [address], [place] [address]    Clinic Day:  12/31/2022  Referring physician: Rolinda Millman, MD  Patient Care Team: Frank Millman, MD as PCP - General (Family Medicine) Frank Jayson MATSU, MD as PCP - Cardiology (Cardiology) Frank Lamar HERO, MD as Consulting Physician (Gastroenterology) Frank Buddle, RN Nurse Navigator as Registered Nurse (Medical Oncology)   ASSESSMENT & PLAN:   Assessment: 1.  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   2.  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 Karis, referred to Dr. Lauralee who did marginal mandibulectomy with primary closure on 09/20/2017. -This showed 2.1 x 0.9 x 0.4 cm squamous cell carcinoma with mandibular bone invasion.  3.  Prostate cancer: - Stage T1c adenocarcinoma, Gleason 4+3, PSA 5.91 - Status post seed implantation by Dr. Patrcia.    Plan: 1.  Stage IV left tonsillar cancer: - Physical exam today: No oropharyngeal masses.  No palpable nodes. - CT soft tissue neck on 12/28/23: No evidence of recurrence. - Labs from 12/28/23: LFTs and CBC grossly normal.  TSH is 6.867 which is not his baseline.  Recheck his TSH in 3 months.  Telephone visit to review results. - Recommend follow-up in 1 year with repeat scan and labs.   2.  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 - Continue follow-up with Dr. Lauralee and Dr. Karis   3.  Smoking history: - He quit smoking in January 2023. - CT scan of the chest from 12/24/2022: Lung RADS 1S. - Repeat CT lung cancer screening is pending from 12/28/2023. -She does report starting to smoke  again.  He is currently on Chantix .  4.  Iron  deficiency state: - Ferritin continues to be low at 16 but his hemoglobin remains normal.  He has consistently been taking iron  supplements 3 times per week. -Patient is interested in trying IV iron  to get his levels up faster.  He denies any bleeding, melena or hematochezia. -Recommend IV iron  based on insurance approval. -Recommend 400 mg IV Venofer  x 2 doses.  Return to clinic in 3 months with labs a few days before and telephone visit to review results of iron  levels.  We will also recheck his TSH at that time.   Orders Placed This Encounter  Procedures   CT SOFT TISSUE NECK W CONTRAST    Standing Status:   Future    Expected Date:   12/30/2024    Expiration Date:   02/17/2025    If indicated for the ordered procedure, I authorize the administration of contrast media per Radiology protocol:   Yes    Does the patient have a contrast media/X-ray dye allergy?:   No    Preferred imaging location?:   [facility]   CT CHEST LUNG CA SCREEN LOW DOSE W/O CM    Standing Status:   Future    Expected Date:   12/30/2024    Expiration Date:   02/17/2025    Preferred Imaging Location?:   [facility]   CBC with Differential    Standing Status:   Future    Expected Date:   12/30/2024  Expiration Date:   02/16/2025   Comprehensive metabolic panel    Standing Status:   Future    Expected Date:   12/30/2024    Expiration Date:   02/16/2025   Iron  and TIBC ([facility])    Standing Status:   Future    Expected Date:   12/30/2024    Expiration Date:   02/16/2025   Ferritin    Standing Status:   Future    Expected Date:   12/30/2024    Expiration Date:   02/16/2025   TSH    Standing Status:   Future    Expected Date:   12/30/2024    Expiration Date:   02/16/2025   CBC with Differential    Standing Status:   Future    Expected Date:   04/05/2024    Expiration Date:   01/03/2025   Iron  and TIBC ([facility])     Standing Status:   Future    Expected Date:   04/05/2024    Expiration Date:   01/03/2025   Ferritin    Standing Status:   Future    Expected Date:   04/05/2024    Expiration Date:   01/03/2025   TSH    Standing Status:   Future    Expected Date:   04/05/2024    Expiration Date:   01/03/2025      Frank FORBES Hope, NP   8/12/202510:09 AM  CHIEF COMPLAINT:   Diagnosis: Malignant neoplasm of prostate and tonsil cancer   Cancer Staging  Malignant neoplasm of prostate [facility]) Staging form: Prostate, AJCC 8th Edition - Clinical stage from 02/08/2020: Stage IIC (cT1c, cN0, cM0, PSA: 5.9, Grade Group: 3) - Unsigned  Tonsil cancer (HCC) Staging form: Pharynx - Oropharynx, AJCC 7th Edition - Clinical: Stage IVA (T2, N2b, M0) - Signed by Frank Domino, MD on 08/02/2015    Prior Therapy: 1. Left tonsillectomy on 07/09/2015 2. Chemoradiation with cisplatin  x 1 cycle and carboplatin , 5-FU x 2 cycles from 09/11/2015 to 09/30/2015 and 70 Gy.  Current Therapy:  surveillance   HISTORY OF PRESENT ILLNESS:   Oncology History  Tonsil cancer (HCC)  08/02/2015 Initial Diagnosis   Cancer of tonsillar fossa (HCC)   08/20/2015 Procedure   Port-a-cath placed by IR   08/20/2015 Procedure   G-tube placed by IR   08/21/2015 - 10/09/2015 Radiation Therapy   70 Gy, Dr. Izell.   08/21/2015 - 09/10/2015 Chemotherapy   Cisplatin  every 21 days with XRT.   08/28/2015 Adverse Reaction   Tinnitis, Cisplatin -induced.   09/11/2015 - 09/30/2015 Chemotherapy   Carboplatin /5FU   09/20/2015 - 09/21/2015 Hospital Admission   Intractable nausea/vomiting. Severe Mucositis   10/17/2015 - 10/20/2015 Hospital Admission   Esophageal/oral candidasis. Shingles. Fever. Chemotherapy induced pancytopenia   01/24/2016 PET scan   Resolution of the left tonsillar mass and left neck adenopathy. No findings for residual or locally recurrent tumor, adenopathy or distant metastatic disease.   03/28/2016 - 04/08/2016 Hospital  Admission   Admitted with severe abdominal pain. Found to diffuse peritonitis from perforated sigmoid colon requiring colostomy. Discharged to [facility] in [place].          INTERVAL HISTORY:   Frank Castillo is a 73 y.o. male presenting to clinic today for follow up of malignant neoplasm of prostate and tonsil cancer.  He also has iron  deficiency anemia and is currently on oral iron  3 days/week.  Patient is followed by us  annually.  He was last seen approximately  a year ago.  Based on chart review, he has not had any hospitalizations or surgeries.  He is followed by Dr. Shaaron for cirrhosis.  Most recent abdominal ultrasound showed cholelithiasis without secondary signs of acute cholecystitis and mild increased hepatic parenchymal echogenicity suggestive of steatosis.  Since his last visit, he started taking iron  supplements 3 times per week due to low ferritin levels.  He will be due for a repeat colonoscopy in 2028.  Reports he started smoking again a few months ago.  He is currently on Chantix  and is smoking less than 8 cigarettes/day.   Today, he states that he is doing well overall. His appetite level is at 100%. His energy level is at 75%.  Reports overall doing well.  Has headaches occasionally and trouble swallowing and chewing.  He denies any new lumps or bumps or concerns for recurrence of his cancer.  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 Karis, 06/2015   Iron  deficiency anemia 05/07/2016   Melanoma (HCC) 1994   right calf   Mixed  hyperlipidemia    Portal hypertension (HCC)    Prostate cancer (HCC)    Tonsillar cancer (HCC) 06/2015   SCC chemo and radiation done   Wears contact lenses    in left eye    Surgical History: Past Surgical History:  Procedure Laterality Date   BIOPSY  07/02/2016   Procedure: BIOPSY;  Surgeon: Lamar CHRISTELLA Shaaron, MD;  [facility];  Service: Endoscopy;;  esophageal   BIOPSY  12/05/2021   Procedure: BIOPSY;  Surgeon: Frank Lamar CHRISTELLA, MD;  [facility];  Service: Endoscopy;;   COLON SURGERY  2017   ruptured colon   COLONOSCOPY WITH PROPOFOL  N/A 07/02/2016   Procedure: COLONOSCOPY WITH PROPOFOL ;  Surgeon: Lamar CHRISTELLA Shaaron, MD;  [facility];  Service: Endoscopy;  Laterality: N/A;  9:45am   COLOSTOMY TAKEDOWN N/A 09/02/2016   Procedure: LAPAROSCOPIC COLOSTOMY REVERSAL;  Surgeon: Bernarda Ned, MD;  [facility];  Service: General;  Laterality: N/A;   ESOPHAGOGASTRODUODENOSCOPY (EGD) WITH PROPOFOL  N/A 07/02/2016   Procedure: ESOPHAGOGASTRODUODENOSCOPY (EGD) WITH PROPOFOL ;  Surgeon: Lamar CHRISTELLA Shaaron, MD;  [facility];  Service: Endoscopy;  Laterality: N/A;   ESOPHAGOGASTRODUODENOSCOPY (EGD) WITH PROPOFOL  N/A 12/28/2019   Procedure: ESOPHAGOGASTRODUODENOSCOPY (EGD) WITH PROPOFOL ;  Surgeon: Frank Lamar CHRISTELLA, MD;  [facility];  Service: Endoscopy;  Laterality: N/A;  11:00am   FLEXIBLE SIGMOIDOSCOPY N/A 12/05/2021   Procedure: FLEXIBLE SIGMOIDOSCOPY;  Surgeon: Frank Lamar CHRISTELLA, MD;  [facility];  Service: Endoscopy;  Laterality: N/A;  2:45pm, asa 3   HERNIA REPAIR Right 2019   dr jerral at [facility] with mesh   HOT HEMOSTASIS  12/05/2021   Procedure: HOT HEMOSTASIS (ARGON PLASMA COAGULATION/BICAP);  Surgeon: Frank Lamar CHRISTELLA, MD;  [facility];  Service: Endoscopy;;   INCISIONAL HERNIA REPAIR  09/02/2016   Procedure: HERNIA REPAIR INCISIONAL;  Surgeon: Bernarda Ned, MD;  [facility];  Service: General;;   LAPAROTOMY N/A  03/28/2016   Procedure: EXPLORATORY LAPAROTOMY, SIGMOID COLECTOMY, COLOSTOMY;  Surgeon: Bernarda Ned, MD;  [facility];  Service: General;  Laterality: N/A;   MALONEY DILATION N/A 07/02/2016   Procedure: AGAPITO HODGKIN;  Surgeon: Lamar CHRISTELLA Hollingshead, MD;  [facility];  Service: Endoscopy;  Laterality: N/A;   MOUTH SURGERY  2019   left lower jaw   PORT-A-CATH REMOVAL  09/02/2016   Procedure: REMOVAL PORT-A-CATH;  Surgeon: Bernarda Ned, MD;  [facility];  Service: General;;   PROSTATE BIOPSY  2021   RADIOACTIVE SEED IMPLANT N/A 06/28/2020   Procedure: RADIOACTIVE SEED IMPLANT/BRACHYTHERAPY IMPLANT WITH CYSTOSCOPY;  Surgeon: Selma Donnice SAUNDERS, MD;  Location: [facility];  Service: Urology;  Laterality: N/A;   REMOVAL OF GASTROSTOMY TUBE N/A 09/02/2016   Procedure: REMOVAL OF GASTROSTOMY TUBE;  Surgeon: Bernarda Ned, MD;  [facility];  Service: General;  Laterality: N/A;   SPACE OAR INSTILLATION N/A 06/28/2020   Procedure: SPACE OAR INSTILLATION;  Surgeon: Selma Donnice SAUNDERS, MD;  [facility];  Service: Urology;  Laterality: N/A;   TONSILLECTOMY Left 07/09/2015   Procedure: LEFT TONSILLECTOMY;  Surgeon: Daniel Moccasin, MD;  Location: [facility];  Service: ENT;  Laterality: Left;   TONSILLECTOMY  2017   left tonsil   Z-PLASTY SCAR REVISION N/A 09/02/2016   Procedure: SCAR REVISION;  Surgeon: Bernarda Ned, MD;  [facility];  Service: General;  Laterality: N/A;    Social History: Social History   Socioeconomic History   Marital status: Single    Spouse name: Not on file   Number of children: 0   Years of education: Not on file   Highest education level: Not on file  Occupational History   Not on file  Tobacco Use   Smoking status: Some Days    Current packs/day: 0.00    Average packs/day: 0.5 packs/day for 30.0 years (15.0 ttl pk-yrs)    Types: Cigarettes    Start date: 11/23/1986    Last attempt to quit: 11/22/2016    Years  since quitting: 7.1   Smokeless tobacco: Never   Tobacco comments:    smoke social occ  Vaping Use   Vaping status: Never Used  Substance and Sexual Activity   Alcohol use: No    Alcohol/week: 0.0 standard drinks of alcohol    Comment: former abuse- quit 09/2011   Drug use: No   Sexual activity: Not Currently  Other Topics Concern   Not on file  Social History Narrative   Divorced.   No children.   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   Worked in Youth worker.    Fully retired at this time.    Has been smoking off/on since age 67, quit smoking 11/2016.    Exercises minimally   Eats all food groups.   Wears seatbelt.   Mother lives with him, has dementia.   Attends AA meetings.    Social Drivers of Corporate investment banker Strain: Not on file  Food Insecurity: Low Risk  (12/22/2022)   Received from [facility]   Hunger Vital Sign    Within the past 12 months, you worried that your food would run out before you got money to buy more: Never true    Within the past 12 months, the food you bought just didn't last and you didn't have money to get  more. : Never true  Transportation Needs: No Transportation Needs (12/22/2022)   Received from [facility]    In the past 12 months, has lack of reliable transportation kept you from medical appointments, meetings, work or from getting things needed for daily living? : No  Physical Activity: Not on file  Stress: Not on file  Social Connections: Not on file  Intimate Partner Violence: Not At Risk (06/27/2020)   Humiliation, Afraid, Rape, and Kick questionnaire    Fear of Current or Ex-Partner: No    Emotionally Abused: No    Physically Abused: No    Sexually Abused: No    Family History: Family History  Problem Relation Age of Onset   Macular degeneration Mother    Dementia Mother    Alcoholism Father    Prostate cancer Father    Diabetes Maternal Aunt    Diabetes  Maternal Uncle    Prostate cancer Maternal Uncle    Mental illness Sister    Pancreatic cancer Neg Hx    Breast cancer Neg Hx    Colon cancer Neg Hx     Current Medications:  Current Outpatient Medications:    Accu-Chek Softclix Lancets lancets, as directed finger stick once a day; Duration: 90 days, Disp: , Rfl:    aspirin  EC 81 MG tablet, Take 1 tablet (81 mg total) by mouth daily. (Patient taking differently: Take 81 mg by mouth 2 (two) times a week.), Disp: , Rfl:    atorvastatin  (LIPITOR) 40 MG tablet, Take 1 tablet (40 mg total) by mouth daily. (Patient taking differently: Take 40 mg by mouth at bedtime.), Disp: 90 tablet, Rfl: 0   Blood Glucose Monitoring Suppl (TRUE METRIX GO GLUCOSE METER) w/Device KIT, as directed., Disp: , Rfl:    cetirizine (ZYRTEC) 10 MG tablet, Take 10 mg by mouth daily., Disp: , Rfl:    Cholecalciferol  (VITAMIN D ) 50 MCG (2000 UT) tablet, Take 2,000 Units by mouth daily., Disp: , Rfl:    DULoxetine (CYMBALTA) 60 MG capsule, Take 120 mg by mouth daily., Disp: , Rfl:    ferrous sulfate 325 (65 FE) MG EC tablet, Take 325 mg by mouth daily with breakfast., Disp: , Rfl:    Magnesium  250 MG CAPS, Take 250 mg by mouth daily., Disp: , Rfl:    pantoprazole  (PROTONIX ) 40 MG tablet, Take 1 tablet (40 mg total) by mouth daily., Disp: 90 tablet, Rfl: 3   sodium fluoride  (FLUORISHIELD) 1.1 % GEL dental gel, Instill one drop of gel per tooth space of fluoride  tray. Place over teeth for 5 minutes. Remove. Spit out excess. Repeat nightly. (Patient taking differently: Place 1 Application onto teeth at bedtime. Instill one drop of gel per tooth space of fluoride  tray. Place over teeth for 5 minutes. Remove. Spit out excess. Repeat nightly.), Disp: 125 mL, Rfl: 11   tamsulosin (FLOMAX) 0.4 MG CAPS capsule, Take 0.8 mg by mouth at bedtime., Disp: , Rfl:    triamcinolone cream (KENALOG) 0.1 %, Apply 1 Application topically 2 (two) times daily as needed (eczema)., Disp: , Rfl:     TRUE METRIX BLOOD GLUCOSE TEST test strip, as directed., Disp: , Rfl:    Turmeric 500 MG CAPS, Take 500 mg by mouth in the morning and at bedtime., Disp: , Rfl:    Varenicline  Tartrate, Starter, 0.5 MG X 11 & 1 MG X 42 TBPK, as directed Orally as directed; Duration: 30 days, Disp: , Rfl:    lisinopril  (ZESTRIL ) 10 MG  tablet, Take 10 mg by mouth daily., Disp: , Rfl:  No current facility-administered medications for this visit.  Facility-Administered Medications Ordered in Other Visits:    0.9 %  sodium chloride  infusion, , Intravenous, Continuous, Kefalas, Debby RAMAN, PA-C, [place] at 03/28/16 1754   0.9 %  sodium chloride  infusion, , Intravenous, Continuous, Kefalas, Thomas S, PA-C   0.9 %  sodium chloride  infusion, , Intravenous, Continuous, Penland, Clotilda K, MD   0.9 %  sodium chloride  infusion, , Intravenous, Continuous, Penland, Clotilda POUR, MD   0.9 %  sodium chloride  infusion, , Intravenous, Continuous, Penland, Clotilda POUR, MD   Allergies: Allergies  Allergen Reactions   Bee Venom Swelling and Anaphylaxis   Other Other (See Comments)    Opthalmic mycin irritates eyes.     Tobramycin Ophthalmic [Tobramycin]     Other reaction(s): DRYNESS, IRRITATION    REVIEW OF SYSTEMS:   Review of Systems  Constitutional:  Positive for fatigue.  HENT:   Positive for trouble swallowing.   Neurological:  Positive for headaches.     VITALS:   Blood pressure 120/75, pulse 90, temperature (!) 97 F (36.1 C), temperature source Tympanic, resp. rate 18, weight 185 lb 10 oz (84.2 kg), SpO2 98%.  Wt Readings from Last 3 Encounters:  01/04/24 185 lb 10 oz (84.2 kg)  07/06/23 182 lb 9.6 oz (82.8 kg)  04/12/23 175 lb (79.4 kg)    Body mass index is 27.41 kg/m.  Performance status (ECOG): 1 - Symptomatic but completely ambulatory  PHYSICAL EXAM:   Physical Exam Constitutional:      Appearance: Normal appearance.  Cardiovascular:     Rate and Rhythm: Normal rate and regular rhythm.   Pulmonary:     Effort: Pulmonary effort is normal.     Breath sounds: Normal breath sounds.  Abdominal:     General: Bowel sounds are normal.     Palpations: Abdomen is soft.  Musculoskeletal:        General: No swelling. Normal range of motion.  Neurological:     Mental Status: He is alert and oriented to person, place, and time. Mental status is at baseline.     LABS:      Latest Ref Rng & Units 12/28/2023    8:07 AM 07/14/2023    9:18 AM 12/24/2022    8:59 AM  CBC  WBC 4.0 - 10.5 K/uL 7.8  6.8  11.3   Hemoglobin 13.0 - 17.0 g/dL 85.0  86.6  86.3   Hematocrit 39.0 - 52.0 % 47.0  41.8  43.9   Platelets 150 - 400 K/uL 237  268  261       Latest Ref Rng & Units 12/28/2023    8:07 AM 07/14/2023    9:18 AM 12/24/2022    8:59 AM  CMP  Glucose 70 - 99 mg/dL 858  860  812   BUN 8 - 23 mg/dL 21  15  18    Creatinine 0.61 - 1.24 mg/dL 8.97  9.23  9.17   Sodium 135 - 145 mmol/L 138  140  136   Potassium 3.5 - 5.1 mmol/L 5.1  4.5  4.1   Chloride 98 - 111 mmol/L 102  105  101   CO2 22 - 32 mmol/L 25  29  27    Calcium  8.9 - 10.3 mg/dL 9.8  9.6  9.0   Total Protein 6.5 - 8.1 g/dL 7.0  7.0  6.9   Total Bilirubin 0.0 - 1.2 mg/dL 0.6  0.3  0.4   Alkaline Phos 38 - 126 U/L 40   46   AST 15 - 41 U/L 14  16  16    ALT 0 - 44 U/L 14  14  18       No results found for: CEA1, CEA / No results found for: CEA1, CEA No results found for: PSA1 No results found for: CAN199 No results found for: CAN125  No results found for: TOTALPROTELP, ALBUMINELP, A1GS, A2GS, BETS, BETA2SER, GAMS, MSPIKE, SPEI Lab Results  Component Value Date   TIBC 452 (H) 12/28/2023   TIBC 410 12/24/2022   TIBC 235 (L) 05/06/2016   FERRITIN 16 (L) 12/28/2023   FERRITIN 10 (L) 12/24/2022   FERRITIN 303 05/27/2016   IRONPCTSAT 20 12/28/2023   IRONPCTSAT 11 (L) 12/24/2022   IRONPCTSAT 6 (L) 05/06/2016   Lab Results  Component Value Date   LDH 118 12/12/2019     STUDIES:   CT SOFT  TISSUE NECK W CONTRAST Result Date: 01/03/2024 CLINICAL DATA:  Monitor head and neck carcinoma EXAM: CT NECK WITH CONTRAST TECHNIQUE: Multidetector CT imaging of the neck was performed using the standard protocol following the bolus administration of intravenous contrast. RADIATION DOSE REDUCTION: This exam was performed according to the departmental dose-optimization program which includes automated exposure control, adjustment of the mA and/or kV according to patient size and/or use of iterative reconstruction technique. CONTRAST:  75mL OMNIPAQUE  IOHEXOL  300 MG/ML  SOLN COMPARISON:  December 24, 2022 FINDINGS: Pharynx: No abnormal mass Oral cavity/floor of mouth: Normal Larynx: Normal Salivary glands: There is fatty replacement of the submandibular glands and left parotid gland unchanged Thyroid : Normal Lymph nodes: Mildly enlarged submental lymph nodes are unchanged Vascular: There is atherosclerotic calcification of the carotid arteries Limited intracranial: No significant abnormality Visualized orbits: No significant abnormality Mastoids and visualized paranasal sinuses: No significant abnormality Skeleton: There is cervical spondylosis Upper chest: There is fibrosis in the lung apices Other: None IMPRESSION: No abnormal mass or adenopathy. No change compared with December 24, 2022 Electronically Signed   By: Nancyann Jerricka Carvey M.D.   On: 01/03/2024 09:38

## 2024-01-04 ENCOUNTER — Inpatient Hospital Stay (HOSPITAL_BASED_OUTPATIENT_CLINIC_OR_DEPARTMENT_OTHER): Payer: Medicare Other | Admitting: Oncology

## 2024-01-04 DIAGNOSIS — D509 Iron deficiency anemia, unspecified: Secondary | ICD-10-CM | POA: Diagnosis not present

## 2024-01-04 DIAGNOSIS — Z08 Encounter for follow-up examination after completed treatment for malignant neoplasm: Secondary | ICD-10-CM

## 2024-01-04 DIAGNOSIS — Z87891 Personal history of nicotine dependence: Secondary | ICD-10-CM

## 2024-01-04 DIAGNOSIS — Z85820 Personal history of malignant melanoma of skin: Secondary | ICD-10-CM | POA: Diagnosis not present

## 2024-01-04 DIAGNOSIS — Z8589 Personal history of malignant neoplasm of other organs and systems: Secondary | ICD-10-CM

## 2024-01-04 DIAGNOSIS — C099 Malignant neoplasm of tonsil, unspecified: Secondary | ICD-10-CM

## 2024-01-04 DIAGNOSIS — Z8546 Personal history of malignant neoplasm of prostate: Secondary | ICD-10-CM | POA: Diagnosis not present

## 2024-01-04 DIAGNOSIS — Z85818 Personal history of malignant neoplasm of other sites of lip, oral cavity, and pharynx: Secondary | ICD-10-CM | POA: Diagnosis not present

## 2024-01-04 DIAGNOSIS — Z9221 Personal history of antineoplastic chemotherapy: Secondary | ICD-10-CM | POA: Diagnosis not present

## 2024-01-05 ENCOUNTER — Inpatient Hospital Stay

## 2024-01-05 VITALS — BP 122/72 | HR 69 | Temp 97.9°F | Resp 16 | Ht 69.0 in | Wt 185.6 lb

## 2024-01-05 DIAGNOSIS — D509 Iron deficiency anemia, unspecified: Secondary | ICD-10-CM | POA: Diagnosis not present

## 2024-01-05 DIAGNOSIS — Z9221 Personal history of antineoplastic chemotherapy: Secondary | ICD-10-CM | POA: Diagnosis not present

## 2024-01-05 DIAGNOSIS — Z85818 Personal history of malignant neoplasm of other sites of lip, oral cavity, and pharynx: Secondary | ICD-10-CM | POA: Diagnosis not present

## 2024-01-05 DIAGNOSIS — Z8546 Personal history of malignant neoplasm of prostate: Secondary | ICD-10-CM | POA: Diagnosis not present

## 2024-01-05 DIAGNOSIS — Z85820 Personal history of malignant melanoma of skin: Secondary | ICD-10-CM | POA: Diagnosis not present

## 2024-01-05 DIAGNOSIS — Z87891 Personal history of nicotine dependence: Secondary | ICD-10-CM | POA: Diagnosis not present

## 2024-01-05 MED ORDER — SODIUM CHLORIDE 0.9 % IV SOLN
400.0000 mg | Freq: Once | INTRAVENOUS | Status: AC
  Administered 2024-01-05 (×2): 400 mg via INTRAVENOUS
  Filled 2024-01-05: qty 400

## 2024-01-05 MED ORDER — SODIUM CHLORIDE 0.9 % IV SOLN
INTRAVENOUS | Status: DC
Start: 2024-01-05 — End: 2024-01-05

## 2024-01-05 NOTE — Patient Instructions (Signed)
 [facility] [place] - A DEPT OF [place]  Discharge Instructions: Thank you for choosing [facility] to provide your oncology and hematology care.  If you have a lab appointment with the Cancer Center - please note that after April 8th, 2024, all labs will be drawn in the cancer center.  You do not have to check in or register with the main entrance as you have in the past but will complete your check-in in the cancer center.  Wear comfortable clothing and clothing appropriate for easy access to any Portacath or PICC line.   We strive to give you quality time with your provider. You may need to reschedule your appointment if you arrive late (15 or more minutes).  Arriving late affects you and other patients whose appointments are after yours.  Also, if you miss three or more appointments without notifying the office, you may be dismissed from the clinic at the provider's discretion.      For prescription refill requests, have your pharmacy contact our office and allow 72 hours for refills to be completed.    Today you received the following iron  infusion: Venofer    To help prevent nausea and vomiting after your treatment, we encourage you to take your nausea medication as directed.  BELOW ARE SYMPTOMS THAT SHOULD BE REPORTED IMMEDIATELY: *FEVER GREATER THAN 100.4 F (38 C) OR HIGHER *CHILLS OR SWEATING *NAUSEA AND VOMITING THAT IS NOT CONTROLLED WITH YOUR NAUSEA MEDICATION *UNUSUAL SHORTNESS OF BREATH *UNUSUAL BRUISING OR BLEEDING *URINARY PROBLEMS (pain or burning when urinating, or frequent urination) *BOWEL PROBLEMS (unusual diarrhea, constipation, pain near the anus) TENDERNESS IN MOUTH AND THROAT WITH OR WITHOUT PRESENCE OF ULCERS (sore throat, sores in mouth, or a toothache) UNUSUAL RASH, SWELLING OR PAIN  UNUSUAL VAGINAL DISCHARGE OR ITCHING   Items with * indicate a potential emergency and should be followed up as soon as possible or go to  the Emergency Department if any problems should occur.  Please show the CHEMOTHERAPY ALERT CARD or IMMUNOTHERAPY ALERT CARD at check-in to the Emergency Department and triage nurse.  Should you have questions after your visit or need to cancel or reschedule your appointment, please contact [facility] [place] - A DEPT OF [facility] [phone]  and follow the prompts.  Office hours are 8:00 a.m. to 4:30 p.m. Monday - Friday. Please note that voicemails left after 4:00 p.m. may not be returned until the following business day.  We are closed weekends and major holidays. You have access to a nurse at all times for urgent questions. Please call the main number to the clinic [phone] and follow the prompts.  For any non-urgent questions, you may also contact your provider using MyChart. We now offer e-Visits for anyone 71 and older to request care online for non-urgent symptoms. For details visit [email or web address].   Also download the MyChart app! Go to the app store, search MyChart, open the app, select , and log in with your MyChart username and password.

## 2024-01-05 NOTE — Progress Notes (Signed)
 Patient took tylenol  and and zyrtec this am.   Treatment given per orders. Patient tolerated it well without problems. Vitals stable and discharged home from clinic ambulatory. Follow up as scheduled.

## 2024-01-09 DIAGNOSIS — I10 Essential (primary) hypertension: Secondary | ICD-10-CM | POA: Diagnosis not present

## 2024-01-09 DIAGNOSIS — J439 Emphysema, unspecified: Secondary | ICD-10-CM | POA: Diagnosis not present

## 2024-01-09 DIAGNOSIS — E118 Type 2 diabetes mellitus with unspecified complications: Secondary | ICD-10-CM | POA: Diagnosis not present

## 2024-01-09 DIAGNOSIS — I700 Atherosclerosis of aorta: Secondary | ICD-10-CM | POA: Diagnosis not present

## 2024-01-11 ENCOUNTER — Ambulatory Visit: Payer: Self-pay | Admitting: Oncology

## 2024-01-12 ENCOUNTER — Ambulatory Visit

## 2024-01-14 ENCOUNTER — Inpatient Hospital Stay

## 2024-01-14 VITALS — BP 147/78 | HR 70 | Temp 97.0°F | Resp 18

## 2024-01-14 DIAGNOSIS — Z85818 Personal history of malignant neoplasm of other sites of lip, oral cavity, and pharynx: Secondary | ICD-10-CM | POA: Diagnosis not present

## 2024-01-14 DIAGNOSIS — D509 Iron deficiency anemia, unspecified: Secondary | ICD-10-CM

## 2024-01-14 DIAGNOSIS — Z87891 Personal history of nicotine dependence: Secondary | ICD-10-CM | POA: Diagnosis not present

## 2024-01-14 DIAGNOSIS — Z9221 Personal history of antineoplastic chemotherapy: Secondary | ICD-10-CM | POA: Diagnosis not present

## 2024-01-14 DIAGNOSIS — Z8546 Personal history of malignant neoplasm of prostate: Secondary | ICD-10-CM | POA: Diagnosis not present

## 2024-01-14 DIAGNOSIS — Z85820 Personal history of malignant melanoma of skin: Secondary | ICD-10-CM | POA: Diagnosis not present

## 2024-01-14 MED ORDER — SODIUM CHLORIDE 0.9 % IV SOLN: INTRAVENOUS | Status: DC

## 2024-01-14 MED ORDER — IRON SUCROSE 400 MG IVPB - SIMPLE MED
400.0000 mg | Freq: Once | Status: AC
  Administered 2024-01-14: 400 mg via INTRAVENOUS
  Filled 2024-01-14: qty 200

## 2024-01-14 NOTE — Progress Notes (Signed)
 Patient tolerated iron infusion with no complaints voiced.  Peripheral IV site clean and dry with good blood return noted before and after infusion.  Band aid applied.  VSS with discharge and left in satisfactory condition with no s/s of distress noted.

## 2024-01-14 NOTE — Patient Instructions (Signed)
 [facility] - A DEPT OF [facility]  Discharge Instructions: Thank you for choosing [facility] to provide your oncology and hematology care.  If you have a lab appointment with the Cancer Center - please note that after April 8th, 2024, all labs will be drawn in the cancer center.  You do not have to check in or register with the main entrance as you have in the past but will complete your check-in in the cancer center.  Wear comfortable clothing and clothing appropriate for easy access to any Portacath or PICC line.   We strive to give you quality time with your provider. You may need to reschedule your appointment if you arrive late (15 or more minutes).  Arriving late affects you and other patients whose appointments are after yours.  Also, if you miss three or more appointments without notifying the office, you may be dismissed from the clinic at the provider's discretion.      For prescription refill requests, have your pharmacy contact our office and allow 72 hours for refills to be completed.    Today you received the following Venofer , return as scheduled.   To help prevent nausea and vomiting after your treatment, we encourage you to take your nausea medication as directed.  BELOW ARE SYMPTOMS THAT SHOULD BE REPORTED IMMEDIATELY: *FEVER GREATER THAN 100.4 F (38 C) OR HIGHER *CHILLS OR SWEATING *NAUSEA AND VOMITING THAT IS NOT CONTROLLED WITH YOUR NAUSEA MEDICATION *UNUSUAL SHORTNESS OF BREATH *UNUSUAL BRUISING OR BLEEDING *URINARY PROBLEMS (pain or burning when urinating, or frequent urination) *BOWEL PROBLEMS (unusual diarrhea, constipation, pain near the anus) TENDERNESS IN MOUTH AND THROAT WITH OR WITHOUT PRESENCE OF ULCERS (sore throat, sores in mouth, or a toothache) UNUSUAL RASH, SWELLING OR PAIN  UNUSUAL VAGINAL DISCHARGE OR ITCHING   Items with * indicate a potential emergency and should be followed up as soon as possible or  go to the Emergency Department if any problems should occur.  Please show the CHEMOTHERAPY ALERT CARD or IMMUNOTHERAPY ALERT CARD at check-in to the Emergency Department and triage nurse.  Should you have questions after your visit or need to cancel or reschedule your appointment, please contact [facility] [place] - A DEPT OF [facility] [phone]  and follow the prompts.  Office hours are 8:00 a.m. to 4:30 p.m. Monday - Friday. Please note that voicemails left after 4:00 p.m. may not be returned until the following business day.  We are closed weekends and major holidays. You have access to a nurse at all times for urgent questions. Please call the main number to the clinic [phone] and follow the prompts.  For any non-urgent questions, you may also contact your provider using MyChart. We now offer e-Visits for anyone 59 and older to request care online for non-urgent symptoms. For details visit [email or web address].   Also download the MyChart app! Go to the app store, search MyChart, open the app, select [facility], and log in with your MyChart username and password.

## 2024-01-18 DIAGNOSIS — Z9889 Other specified postprocedural states: Secondary | ICD-10-CM | POA: Diagnosis not present

## 2024-01-18 DIAGNOSIS — H35373 Puckering of macula, bilateral: Secondary | ICD-10-CM | POA: Diagnosis not present

## 2024-01-23 DIAGNOSIS — F321 Major depressive disorder, single episode, moderate: Secondary | ICD-10-CM | POA: Diagnosis not present

## 2024-01-23 DIAGNOSIS — I10 Essential (primary) hypertension: Secondary | ICD-10-CM | POA: Diagnosis not present

## 2024-01-23 DIAGNOSIS — E118 Type 2 diabetes mellitus with unspecified complications: Secondary | ICD-10-CM | POA: Diagnosis not present

## 2024-01-23 DIAGNOSIS — J439 Emphysema, unspecified: Secondary | ICD-10-CM | POA: Diagnosis not present

## 2024-01-31 DIAGNOSIS — I781 Nevus, non-neoplastic: Secondary | ICD-10-CM | POA: Diagnosis not present

## 2024-01-31 DIAGNOSIS — L570 Actinic keratosis: Secondary | ICD-10-CM | POA: Diagnosis not present

## 2024-01-31 DIAGNOSIS — Z85828 Personal history of other malignant neoplasm of skin: Secondary | ICD-10-CM | POA: Diagnosis not present

## 2024-01-31 DIAGNOSIS — Z85820 Personal history of malignant melanoma of skin: Secondary | ICD-10-CM | POA: Diagnosis not present

## 2024-02-08 ENCOUNTER — Encounter: Payer: Self-pay | Admitting: Internal Medicine

## 2024-02-08 ENCOUNTER — Encounter: Payer: Self-pay | Admitting: *Deleted

## 2024-02-08 ENCOUNTER — Ambulatory Visit: Admitting: Internal Medicine

## 2024-02-08 VITALS — BP 105/67 | HR 94 | Temp 98.1°F | Ht 69.0 in | Wt 185.8 lb

## 2024-02-08 DIAGNOSIS — K7030 Alcoholic cirrhosis of liver without ascites: Secondary | ICD-10-CM | POA: Diagnosis not present

## 2024-02-08 DIAGNOSIS — I10 Essential (primary) hypertension: Secondary | ICD-10-CM | POA: Diagnosis not present

## 2024-02-08 DIAGNOSIS — J439 Emphysema, unspecified: Secondary | ICD-10-CM | POA: Diagnosis not present

## 2024-02-08 DIAGNOSIS — K2100 Gastro-esophageal reflux disease with esophagitis, without bleeding: Secondary | ICD-10-CM | POA: Diagnosis not present

## 2024-02-08 DIAGNOSIS — E118 Type 2 diabetes mellitus with unspecified complications: Secondary | ICD-10-CM | POA: Diagnosis not present

## 2024-02-08 DIAGNOSIS — I700 Atherosclerosis of aorta: Secondary | ICD-10-CM | POA: Diagnosis not present

## 2024-02-08 NOTE — Patient Instructions (Signed)
 It was good to see you again today!  You appear to be doing very well from a cirrhosis standpoint.  We will continue to check your liver with an ultrasound picture every 6 months  Will plan for EGD to check for esophageal varices between now and Thanksgiving  Continue pantoprazole  40 mg daily best taken before breakfast.  GERD information provided.  It is important to not eat in 3 hours of going to bed (or reclining at other times).  1 more colonoscopy in 2028 for screening purposes.  Further recommendations to follow

## 2024-02-08 NOTE — H&P (View-Only) (Signed)
 Primary Care Physician:  Rolinda Millman, MD Primary Gastroenterologist:  Dr. Shaaron  Pre-Procedure History & Physical: HPI:  Frank Castillo is a 73 y.o. male here for follow-up of EtOH related liver disease.  History of head neck, prostate cancer as well as melanoma.  He remains well compensated.  No esophageal varices on prior EGD.  History of iron  deficiency anemia followed by hematology has received iron  infusions.  Hepatic ultrasound negative in February.  Alpha-fetoprotein 2.5.  Has been vaccinated against hepatitis A and B.  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   Wears contact lenses    in left eye    Past Surgical History:  Procedure Laterality Date   BIOPSY  07/02/2016   Procedure: BIOPSY;  Surgeon: Lamar CHRISTELLA Shaaron, MD;  [facility];  Service: Endoscopy;;  esophageal   BIOPSY  12/05/2021   Procedure: BIOPSY;  Surgeon: Shaaron Lamar CHRISTELLA, MD;  [facility];  Service: Endoscopy;;   COLON SURGERY  2017   ruptured colon   COLONOSCOPY WITH PROPOFOL  N/A 07/02/2016   Procedure: COLONOSCOPY WITH PROPOFOL ;  Surgeon: Lamar CHRISTELLA Shaaron, MD;  [facility];  Service: Endoscopy;  Laterality: N/A;  9:45am   COLOSTOMY TAKEDOWN N/A 09/02/2016   Procedure: LAPAROSCOPIC COLOSTOMY REVERSAL;  Surgeon: Bernarda Ned,  MD;  Location: [facility];  Service: General;  Laterality: N/A;   ESOPHAGOGASTRODUODENOSCOPY (EGD) WITH PROPOFOL  N/A 07/02/2016   Procedure: ESOPHAGOGASTRODUODENOSCOPY (EGD) WITH PROPOFOL ;  Surgeon: Lamar CHRISTELLA Shaaron, MD;  [facility];  Service: Endoscopy;  Laterality: N/A;   ESOPHAGOGASTRODUODENOSCOPY (EGD) WITH PROPOFOL  N/A 12/28/2019   Procedure: ESOPHAGOGASTRODUODENOSCOPY (EGD) WITH PROPOFOL ;  Surgeon: Shaaron Lamar CHRISTELLA, MD;  [facility];  Service: Endoscopy;  Laterality: N/A;  11:00am   FLEXIBLE SIGMOIDOSCOPY N/A 12/05/2021   Procedure: FLEXIBLE SIGMOIDOSCOPY;  Surgeon: Shaaron Lamar CHRISTELLA, MD;  [facility];  Service: Endoscopy;  Laterality: N/A;  2:45pm, asa 3   HERNIA REPAIR Right 2019   dr jerral at [facility] with mesh   HOT HEMOSTASIS  12/05/2021   Procedure: HOT HEMOSTASIS (ARGON PLASMA COAGULATION/BICAP);  Surgeon: Shaaron Lamar CHRISTELLA, MD;  [facility];  Service: Endoscopy;;   INCISIONAL HERNIA REPAIR  09/02/2016   Procedure: HERNIA REPAIR INCISIONAL;  Surgeon: Bernarda Ned, MD;  [facility];  Service: General;;   LAPAROTOMY N/A 03/28/2016   Procedure: EXPLORATORY LAPAROTOMY, SIGMOID COLECTOMY, COLOSTOMY;  Surgeon: Bernarda Ned, MD;  [facility];  Service: General;  Laterality: N/A;   MALONEY DILATION N/A 07/02/2016   Procedure: AGAPITO HODGKIN;  Surgeon: Lamar CHRISTELLA Shaaron, MD;  [facility];  Service: Endoscopy;  Laterality: N/A;  MOUTH SURGERY  2019   left lower jaw   PORT-A-CATH REMOVAL  09/02/2016   Procedure: REMOVAL PORT-A-CATH;  Surgeon: Bernarda Ned, MD;  [facility];  Service: General;;   PROSTATE BIOPSY  2021   RADIOACTIVE SEED IMPLANT N/A 06/28/2020   Procedure: RADIOACTIVE SEED IMPLANT/BRACHYTHERAPY IMPLANT WITH CYSTOSCOPY;  Surgeon: Selma Donnice SAUNDERS, MD;  Location: [facility];  Service: Urology;  Laterality: N/A;   REMOVAL OF GASTROSTOMY TUBE N/A 09/02/2016   Procedure: REMOVAL OF GASTROSTOMY TUBE;  Surgeon:  Bernarda Ned, MD;  [facility];  Service: General;  Laterality: N/A;   SPACE OAR INSTILLATION N/A 06/28/2020   Procedure: SPACE OAR INSTILLATION;  Surgeon: Selma Donnice SAUNDERS, MD;  Location: [facility];  Service: Urology;  Laterality: N/A;   TONSILLECTOMY Left 07/09/2015   Procedure: LEFT TONSILLECTOMY;  Surgeon: Daniel Moccasin, MD;  Location: [facility];  Service: ENT;  Laterality: Left;   TONSILLECTOMY  2017   left tonsil   Z-PLASTY SCAR REVISION N/A 09/02/2016   Procedure: SCAR REVISION;  Surgeon: Bernarda Ned, MD;  [facility];  Service: General;  Laterality: N/A;    Prior to Admission medications   Medication Sig Start Date End Date Taking? Authorizing Provider  Accu-Chek Softclix Lancets lancets as directed finger stick once a day; Duration: 90 days 01/16/22  Yes [provider]  aspirin  EC 81 MG tablet Take 1 tablet (81 mg total) by mouth daily. Patient taking differently: Take 81 mg by mouth 2 (two) times a week. 06/17/17  Yes Hagler, Vernell, MD  atorvastatin  (LIPITOR) 40 MG tablet Take 1 tablet (40 mg total) by mouth daily. Patient taking differently: Take 40 mg by mouth at bedtime. 11/29/17  Yes [name], MD  Blood Glucose Monitoring Suppl (TRUE METRIX GO GLUCOSE METER) w/Device KIT as directed. 10/05/23  Yes [provider]  cetirizine (ZYRTEC) 10 MG tablet Take 10 mg by mouth daily.   Yes [provider]  Cholecalciferol  (VITAMIN D ) 50 MCG (2000 UT) tablet Take 2,000 Units by mouth daily.   Yes [provider]  dexamethasone  (DECADRON ) 0.5 MG/5ML solution RINSE MOUTH WITH 1 TEASPOONFUL FOR 2 minutes, THEN spit OUT FOUR TIMES DAILY 01/25/24  Yes [provider]  DULoxetine (CYMBALTA) 60 MG capsule Take 120 mg by mouth daily. 11/03/18  Yes [provider]  ferrous sulfate 325 (65 FE) MG EC tablet Take 325 mg by mouth daily with breakfast.   Yes [provider]  lisinopril  (ZESTRIL ) 10 MG tablet  Take 10 mg by mouth daily.   Yes [provider]  Magnesium  250 MG CAPS Take 250 mg by mouth daily.   Yes [provider]  pantoprazole  (PROTONIX ) 40 MG tablet Take 1 tablet (40 mg total) by mouth daily. 07/12/23  Yes Destry Dauber, Lamar HERO, MD  prednisoLONE acetate (PRED FORTE) 1 % ophthalmic suspension Place 1 drop into the left eye 4 (four) times daily. 01/18/24  Yes [provider]  sodium fluoride  (FLUORISHIELD) 1.1 % GEL dental gel Instill one drop of gel per tooth space of fluoride  tray. Place over teeth for 5 minutes. Remove. Spit out excess. Repeat nightly. Patient taking differently: Place 1 Application onto teeth at bedtime. Instill one drop of gel per tooth space of fluoride  tray. Place over teeth for 5 minutes. Remove. Spit out excess. Repeat nightly. 03/16/17  Yes Hagler, Vernell, MD  tamsulosin (FLOMAX) 0.4 MG CAPS capsule Take 0.8 mg by mouth at bedtime. 11/18/20  Yes [provider]  triamcinolone cream (KENALOG) 0.1 % Apply 1 Application topically 2 (two) times daily as needed (eczema).   Yes [provider]  TRUE METRIX BLOOD GLUCOSE TEST test strip as directed. 12/02/23  Yes [provider]  Turmeric 500 MG CAPS Take 500 mg by mouth in the morning and at bedtime.   Yes [provider]  varenicline  (CHANTIX ) 0.5 MG tablet Take 0.5 mg by mouth daily. 01/25/24  Yes [provider]    Allergies as of 02/08/2024 - Review Complete 02/08/2024  Allergen Reaction Noted   Bee venom Swelling and Anaphylaxis 07/31/2015   Other Other (See Comments) 11/28/2015   Tobramycin ophthalmic [tobramycin]  06/12/2020    Family History  Problem Relation Age of Onset   Macular degeneration Mother    Dementia Mother    Alcoholism Father    Prostate cancer Father    Diabetes Maternal Aunt    Diabetes Maternal Uncle    Prostate cancer Maternal Uncle    Mental illness Sister    Pancreatic cancer Neg Hx    Breast cancer Neg Hx    Colon  cancer Neg Hx     Social History   Socioeconomic History   Marital status: Single    Spouse name: Not on file   Number of children: 0   Years of education: Not on file   Highest education level: Not on file  Occupational History   Not on file  Tobacco Use   Smoking status: Some Days    Current packs/day: 0.00    Average packs/day: 0.5 packs/day for 30.0 years (15.0 ttl pk-yrs)    Types: Cigarettes    Start date: 11/23/1986    Last attempt to quit: 11/22/2016    Years since quitting: 7.2   Smokeless tobacco: Never   Tobacco comments:    smoke social occ  Vaping Use   Vaping status: Never Used  Substance and Sexual Activity   Alcohol use: No    Alcohol/week: 0.0 standard drinks of alcohol    Comment: former abuse- quit 09/2011   Drug use: No   Sexual activity: Not Currently  Other Topics Concern   Not on file  Social History Narrative   Divorced.   No children.   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   Worked in Youth worker.    Fully retired at this time.    Has been smoking off/on since age 50, quit smoking 11/2016.    Exercises minimally   Eats all food groups.   Wears seatbelt.   Mother lives with him, has dementia.   Attends AA meetings.    Social Drivers of Corporate investment banker Strain: Not on file  Food Insecurity: Low Risk  (12/22/2022)   Received from [facility]   Hunger Vital Sign    Within the past 12 months, you worried that your food would run out before you got money to buy more: Never true    Within the past 12 months, the food you bought just didn't last and you didn't have money to get more. : Never true  Transportation Needs: No Transportation Needs (12/22/2022)   Received from [facility]    In the past 12 months, has lack of reliable transportation kept you from medical appointments, meetings, work or from getting things needed for daily living? : No  Physical Activity: Not on file   Stress: Not on file  Social Connections: Not  on file  Intimate Partner Violence: Not At Risk (06/27/2020)   Humiliation, Afraid, Rape, and Kick questionnaire    Fear of Current or Ex-Partner: No    Emotionally Abused: No    Physically Abused: No    Sexually Abused: No    Review of Systems: See HPI, otherwise negative ROS  Physical Exam: BP 105/67 (BP Location: Right Arm, Patient Position: Sitting, Cuff Size: Normal)   Pulse 94   Temp 98.1 F (36.7 C) (Oral)   Ht 5' 9 (1.753 m)   Wt 185 lb 12.8 oz (84.3 kg)   SpO2 98%   BMI 27.44 kg/m  General:   Alert,  Well-developed, well-nourished, pleasant and cooperative in NAD Skin: No cutaneous stigmata of chronic liver disease.   Lungs:  Clear throughout to auscultation.   No wheezes, crackles, or rhonchi. No acute distress. Heart:  Regular rate and rhythm; no murmurs, clicks, rubs,  or gallops. Abdomen: Non-distended, normal bowel sounds.  Soft and nontender without appreciable mass or hepatosplenomegaly.   Impression/Plan: 73 year old gentleman with well compensated EtOH related cirrhosis.  He is due for variceal screening.  Hepatic imaging via ultrasound every 6 months  Recommend 1 more screening colonoscopy in 2028  GERD fairly well-controlled on once daily pantoprazole .  He was admonished about lifestyle modification including avoiding eating just before going to bed.  Recommendations:   Will plan for EGD to check for esophageal varices between now and Thanksgiving.  The risks, benefits, limitations, alternatives and imponderables have been reviewed with the patient. Potential for esophageal dilation, biopsy, etc. have also been reviewed.  Questions have been answered. All parties agreeable.   Continue pantoprazole  40 mg daily best taken before breakfast.  GERD information provided.  It is important to not eat in 3 hours of going to bed (or reclining at other times).  1 more colonoscopy in 2028 for screening  purposes.  Further recommendations to follow   Notice: This dictation was prepared with Dragon dictation along with smaller phrase technology. Any transcriptional errors that result from this process are unintentional and may not be corrected upon review.

## 2024-02-08 NOTE — Progress Notes (Unsigned)
 Primary Care Physician:  Rolinda Millman, MD Primary Gastroenterologist:  Dr. Shaaron  Pre-Procedure History & Physical: HPI:  Frank Castillo is a 73 y.o. male here for follow-up of EtOH related liver disease.  History of head neck, prostate cancer as well as melanoma.  He remains well compensated.  No esophageal varices on prior EGD.  History of iron  deficiency anemia followed by hematology has received iron  infusions.  Hepatic ultrasound negative in February.  Alpha-fetoprotein 2.5.  Has been vaccinated against hepatitis A and B.  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   Wears contact lenses    in left eye    Past Surgical History:  Procedure Laterality Date   BIOPSY  07/02/2016   Procedure: BIOPSY;  Surgeon: Lamar CHRISTELLA Shaaron, MD;  [facility]: [facility];  Service: Endoscopy;;  esophageal   BIOPSY  12/05/2021   Procedure: BIOPSY;  Surgeon: Shaaron Lamar CHRISTELLA, MD;  [facility]: [facility];  Service: Endoscopy;;   COLON SURGERY  2017   ruptured colon   COLONOSCOPY WITH PROPOFOL  N/A 07/02/2016   Procedure: COLONOSCOPY WITH PROPOFOL ;  Surgeon: Lamar CHRISTELLA Shaaron, MD;  [facility];  Service: Endoscopy;  Laterality: N/A;  9:45am   COLOSTOMY TAKEDOWN N/A 09/02/2016   Procedure: LAPAROSCOPIC COLOSTOMY REVERSAL;  Surgeon: Bernarda Ned,  MD;  Location: [facility];  Service: General;  Laterality: N/A;   ESOPHAGOGASTRODUODENOSCOPY (EGD) WITH PROPOFOL  N/A 07/02/2016   Procedure: ESOPHAGOGASTRODUODENOSCOPY (EGD) WITH PROPOFOL ;  Surgeon: Lamar CHRISTELLA Shaaron, MD;  [facility];  Service: Endoscopy;  Laterality: N/A;   ESOPHAGOGASTRODUODENOSCOPY (EGD) WITH PROPOFOL  N/A 12/28/2019   Procedure: ESOPHAGOGASTRODUODENOSCOPY (EGD) WITH PROPOFOL ;  Surgeon: Shaaron Lamar CHRISTELLA, MD;  [facility];  Service: Endoscopy;  Laterality: N/A;  11:00am   FLEXIBLE SIGMOIDOSCOPY N/A 12/05/2021   Procedure: FLEXIBLE SIGMOIDOSCOPY;  Surgeon: Shaaron Lamar CHRISTELLA, MD;  [facility];  Service: Endoscopy;  Laterality: N/A;  2:45pm, asa 3   HERNIA REPAIR Right 2019   dr jerral at [facility] with mesh   HOT HEMOSTASIS  12/05/2021   Procedure: HOT HEMOSTASIS (ARGON PLASMA COAGULATION/BICAP);  Surgeon: Shaaron Lamar CHRISTELLA, MD;  [facility];  Service: Endoscopy;;   INCISIONAL HERNIA REPAIR  09/02/2016   Procedure: HERNIA REPAIR INCISIONAL;  Surgeon: Bernarda Ned, MD;  Location: [facility];  Service: General;;   LAPAROTOMY N/A 03/28/2016   Procedure: EXPLORATORY LAPAROTOMY, SIGMOID COLECTOMY, COLOSTOMY;  Surgeon: Bernarda Ned, MD;  Location: [facility];  Service: General;  Laterality: N/A;   MALONEY DILATION N/A 07/02/2016   Procedure: AGAPITO HODGKIN;  Surgeon: Lamar CHRISTELLA Shaaron, MD;  [facility];  Service: Endoscopy;  Laterality: N/A;  MOUTH SURGERY  2019   left lower jaw   PORT-A-CATH REMOVAL  09/02/2016   Procedure: REMOVAL PORT-A-CATH;  Surgeon: Bernarda Ned, MD;  Location: [facility];  Service: General;;   PROSTATE BIOPSY  2021   RADIOACTIVE SEED IMPLANT N/A 06/28/2020   Procedure: RADIOACTIVE SEED IMPLANT/BRACHYTHERAPY IMPLANT WITH CYSTOSCOPY;  Surgeon: Selma Donnice SAUNDERS, MD;  Location: [facility];  Service: Urology;  Laterality: N/A;   REMOVAL OF GASTROSTOMY TUBE N/A 09/02/2016   Procedure: REMOVAL OF GASTROSTOMY TUBE;  Surgeon:  Bernarda Ned, MD;  Location: [facility];  Service: General;  Laterality: N/A;   SPACE OAR INSTILLATION N/A 06/28/2020   Procedure: SPACE OAR INSTILLATION;  Surgeon: Selma Donnice SAUNDERS, MD;  Location: [facility];  Service: Urology;  Laterality: N/A;   TONSILLECTOMY Left 07/09/2015   Procedure: LEFT TONSILLECTOMY;  Surgeon: Daniel Moccasin, MD;  Location: [facility];  Service: ENT;  Laterality: Left;   TONSILLECTOMY  2017   left tonsil   Z-PLASTY SCAR REVISION N/A 09/02/2016   Procedure: SCAR REVISION;  Surgeon: Bernarda Ned, MD;  Location: [facility];  Service: General;  Laterality: N/A;    Prior to Admission medications   Medication Sig Start Date End Date Taking? Authorizing Provider  Accu-Chek Softclix Lancets lancets as directed finger stick once a day; Duration: 90 days 01/16/22  Yes [provider]  aspirin  EC 81 MG tablet Take 1 tablet (81 mg total) by mouth daily. Patient taking differently: Take 81 mg by mouth 2 (two) times a week. 06/17/17  Yes Hagler, Vernell, MD  atorvastatin  (LIPITOR) 40 MG tablet Take 1 tablet (40 mg total) by mouth daily. Patient taking differently: Take 40 mg by mouth at bedtime. 11/29/17  Yes [name], MD  Blood Glucose Monitoring Suppl (TRUE METRIX GO GLUCOSE METER) w/Device KIT as directed. 10/05/23  Yes [provider]  cetirizine (ZYRTEC) 10 MG tablet Take 10 mg by mouth daily.   Yes [provider]  Cholecalciferol  (VITAMIN D ) 50 MCG (2000 UT) tablet Take 2,000 Units by mouth daily.   Yes [provider]  dexamethasone  (DECADRON ) 0.5 MG/5ML solution RINSE MOUTH WITH 1 TEASPOONFUL FOR 2 minutes, THEN spit OUT FOUR TIMES DAILY 01/25/24  Yes [provider]  DULoxetine (CYMBALTA) 60 MG capsule Take 120 mg by mouth daily. 11/03/18  Yes [provider]  ferrous sulfate 325 (65 FE) MG EC tablet Take 325 mg by mouth daily with breakfast.   Yes [provider]  lisinopril  (ZESTRIL ) 10 MG tablet  Take 10 mg by mouth daily.   Yes [provider]  Magnesium  250 MG CAPS Take 250 mg by mouth daily.   Yes [provider]  pantoprazole  (PROTONIX ) 40 MG tablet Take 1 tablet (40 mg total) by mouth daily. 07/12/23  Yes Hadas Jessop, Lamar HERO, MD  prednisoLONE acetate (PRED FORTE) 1 % ophthalmic suspension Place 1 drop into the left eye 4 (four) times daily. 01/18/24  Yes [provider]  sodium fluoride  (FLUORISHIELD) 1.1 % GEL dental gel Instill one drop of gel per tooth space of fluoride  tray. Place over teeth for 5 minutes. Remove. Spit out excess. Repeat nightly. Patient taking differently: Place 1 Application onto teeth at bedtime. Instill one drop of gel per tooth space of fluoride  tray. Place over teeth for 5 minutes. Remove. Spit out excess. Repeat nightly. 03/16/17  Yes Hagler, Vernell, MD  tamsulosin (FLOMAX) 0.4 MG CAPS capsule Take 0.8 mg by mouth at bedtime. 11/18/20  Yes [provider]  triamcinolone cream (KENALOG) 0.1 % Apply 1 Application topically 2 (two) times daily as needed (eczema).   Yes [provider]  TRUE METRIX BLOOD GLUCOSE TEST test strip as directed. 12/02/23  Yes [provider]  Turmeric 500 MG CAPS Take 500 mg by mouth in the morning and at bedtime.   Yes [provider]  varenicline  (CHANTIX ) 0.5 MG tablet Take 0.5 mg by mouth daily. 01/25/24  Yes [provider]    Allergies as of 02/08/2024 - Review Complete 02/08/2024  Allergen Reaction Noted   Bee venom Swelling and Anaphylaxis 07/31/2015   Other Other (See Comments) 11/28/2015   Tobramycin ophthalmic [tobramycin]  06/12/2020    Family History  Problem Relation Age of Onset   Macular degeneration Mother    Dementia Mother    Alcoholism Father    Prostate cancer Father    Diabetes Maternal Aunt    Diabetes Maternal Uncle    Prostate cancer Maternal Uncle    Mental illness Sister    Pancreatic cancer Neg Hx    Breast cancer Neg Hx    Colon  cancer Neg Hx     Social History   Socioeconomic History   Marital status: Single    Spouse name: Not on file   Number of children: 0   Years of education: Not on file   Highest education level: Not on file  Occupational History   Not on file  Tobacco Use   Smoking status: Some Days    Current packs/day: 0.00    Average packs/day: 0.5 packs/day for 30.0 years (15.0 ttl pk-yrs)    Types: Cigarettes    Start date: 11/23/1986    Last attempt to quit: 11/22/2016    Years since quitting: 7.2   Smokeless tobacco: Never   Tobacco comments:    smoke social occ  Vaping Use   Vaping status: Never Used  Substance and Sexual Activity   Alcohol use: No    Alcohol/week: 0.0 standard drinks of alcohol    Comment: former abuse- quit 09/2011   Drug use: No   Sexual activity: Not Currently  Other Topics Concern   Not on file  Social History Narrative   Divorced.   No children.   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   Worked in Youth worker.    Fully retired at this time.    Has been smoking off/on since age 89, quit smoking 11/2016.    Exercises minimally   Eats all food groups.   Wears seatbelt.   Mother lives with him, has dementia.   Attends AA meetings.    Social Drivers of Corporate investment banker Strain: Not on file  Food Insecurity: Low Risk  (12/22/2022)   Received from [facility]   Hunger Vital Sign    Within the past 12 months, you worried that your food would run out before you got money to buy more: Never true    Within the past 12 months, the food you bought just didn't last and you didn't have money to get more. : Never true  Transportation Needs: No Transportation Needs (12/22/2022)   Received from [facility]    In the past 12 months, has lack of reliable transportation kept you from medical appointments, meetings, work or from getting things needed for daily living? : No  Physical Activity: Not on file   Stress: Not on file  Social Connections: Not  on file  Intimate Partner Violence: Not At Risk (06/27/2020)   Humiliation, Afraid, Rape, and Kick questionnaire    Fear of Current or Ex-Partner: No    Emotionally Abused: No    Physically Abused: No    Sexually Abused: No    Review of Systems: See HPI, otherwise negative ROS  Physical Exam: BP 105/67 (BP Location: Right Arm, Patient Position: Sitting, Cuff Size: Normal)   Pulse 94   Temp 98.1 F (36.7 C) (Oral)   Ht 5' 9 (1.753 m)   Wt 185 lb 12.8 oz (84.3 kg)   SpO2 98%   BMI 27.44 kg/m  General:   Alert,  Well-developed, well-nourished, pleasant and cooperative in NAD Skin: No cutaneous stigmata of chronic liver disease.   Lungs:  Clear throughout to auscultation.   No wheezes, crackles, or rhonchi. No acute distress. Heart:  Regular rate and rhythm; no murmurs, clicks, rubs,  or gallops. Abdomen: Non-distended, normal bowel sounds.  Soft and nontender without appreciable mass or hepatosplenomegaly.   Impression/Plan: 73 year old gentleman with well compensated EtOH related cirrhosis.  He is due for variceal screening.  Hepatic imaging via ultrasound every 6 months  Recommend 1 more screening colonoscopy in 2028  GERD fairly well-controlled on once daily pantoprazole .  He was admonished about lifestyle modification including avoiding eating just before going to bed.  Recommendations:   Will plan for EGD to check for esophageal varices between now and Thanksgiving.  The risks, benefits, limitations, alternatives and imponderables have been reviewed with the patient. Potential for esophageal dilation, biopsy, etc. have also been reviewed.  Questions have been answered. All parties agreeable.   Continue pantoprazole  40 mg daily best taken before breakfast.  GERD information provided.  It is important to not eat in 3 hours of going to bed (or reclining at other times).  1 more colonoscopy in 2028 for screening  purposes.  Further recommendations to follow   Notice: This dictation was prepared with Dragon dictation along with smaller phrase technology. Any transcriptional errors that result from this process are unintentional and may not be corrected upon review.

## 2024-02-22 DIAGNOSIS — E118 Type 2 diabetes mellitus with unspecified complications: Secondary | ICD-10-CM | POA: Diagnosis not present

## 2024-02-22 DIAGNOSIS — F321 Major depressive disorder, single episode, moderate: Secondary | ICD-10-CM | POA: Diagnosis not present

## 2024-02-22 DIAGNOSIS — I700 Atherosclerosis of aorta: Secondary | ICD-10-CM | POA: Diagnosis not present

## 2024-02-22 DIAGNOSIS — I10 Essential (primary) hypertension: Secondary | ICD-10-CM | POA: Diagnosis not present

## 2024-02-22 DIAGNOSIS — J439 Emphysema, unspecified: Secondary | ICD-10-CM | POA: Diagnosis not present

## 2024-03-02 ENCOUNTER — Other Ambulatory Visit: Payer: Self-pay

## 2024-03-02 ENCOUNTER — Encounter (HOSPITAL_COMMUNITY): Payer: Self-pay | Admitting: Internal Medicine

## 2024-03-02 ENCOUNTER — Ambulatory Visit (HOSPITAL_COMMUNITY)

## 2024-03-02 ENCOUNTER — Ambulatory Visit (HOSPITAL_COMMUNITY)
Admission: RE | Admit: 2024-03-02 | Discharge: 2024-03-02 | Disposition: A | Discharge status: Home / Self Care | Attending: Internal Medicine | Admitting: Internal Medicine

## 2024-03-02 ENCOUNTER — Encounter (HOSPITAL_COMMUNITY)
Admission: RE | Disposition: A | Discharge status: Home / Self Care | Payer: Self-pay | Source: Home / Self Care | Attending: Internal Medicine

## 2024-03-02 DIAGNOSIS — K219 Gastro-esophageal reflux disease without esophagitis: Secondary | ICD-10-CM | POA: Diagnosis not present

## 2024-03-02 DIAGNOSIS — K766 Portal hypertension: Secondary | ICD-10-CM | POA: Insufficient documentation

## 2024-03-02 DIAGNOSIS — K3189 Other diseases of stomach and duodenum: Secondary | ICD-10-CM | POA: Diagnosis not present

## 2024-03-02 DIAGNOSIS — F418 Other specified anxiety disorders: Secondary | ICD-10-CM

## 2024-03-02 DIAGNOSIS — K7460 Unspecified cirrhosis of liver: Secondary | ICD-10-CM | POA: Insufficient documentation

## 2024-03-02 DIAGNOSIS — I10 Essential (primary) hypertension: Secondary | ICD-10-CM | POA: Insufficient documentation

## 2024-03-02 DIAGNOSIS — Z87891 Personal history of nicotine dependence: Secondary | ICD-10-CM | POA: Insufficient documentation

## 2024-03-02 HISTORY — PX: ESOPHAGOGASTRODUODENOSCOPY: SHX5428

## 2024-03-02 SURGERY — EGD (ESOPHAGOGASTRODUODENOSCOPY)
Anesthesia: General

## 2024-03-02 MED ORDER — LIDOCAINE HCL (CARDIAC) PF 100 MG/5ML IV SOSY
PREFILLED_SYRINGE | INTRAVENOUS | Status: DC | PRN
  Administered 2024-03-02: 100 mg via INTRAVENOUS

## 2024-03-02 MED ORDER — LACTATED RINGERS IV SOLN: INTRAVENOUS | Status: DC

## 2024-03-02 MED ORDER — PROPOFOL 10 MG/ML IV BOLUS
INTRAVENOUS | Status: DC | PRN
  Administered 2024-03-02: 100 mg via INTRAVENOUS
  Administered 2024-03-02: 150 ug/kg/min via INTRAVENOUS

## 2024-03-02 NOTE — Transfer of Care (Signed)
 Immediate Anesthesia Transfer of Care Note  Patient: Frank Castillo  Procedure(s) Performed: EGD (ESOPHAGOGASTRODUODENOSCOPY)  Patient Location: Endoscopy Unit  Anesthesia Type:General  Level of Consciousness: drowsy, patient cooperative, and responds to stimulation  Airway & Oxygen Therapy: Patient Spontanous Breathing  Post-op Assessment: Report given to RN and Post -op Vital signs reviewed and stable  Post vital signs: Reviewed and stable  Last Vitals:  Vitals Value Taken Time  BP 100/63 03/02/24 09:42  Temp 36.8 C 03/02/24 09:42  Pulse 78 03/02/24 09:42  Resp 16 03/02/24 09:42  SpO2 95 % 03/02/24 09:42    Last Pain:  Vitals:   03/02/24 0942  TempSrc: Oral  PainSc:       Patients Stated Pain Goal: 5 (03/02/24 0825)  Complications: No notable events documented.

## 2024-03-02 NOTE — Anesthesia Postprocedure Evaluation (Signed)
 Anesthesia Post Note  Patient: Frank Castillo  Procedure(s) Performed: EGD (ESOPHAGOGASTRODUODENOSCOPY)  Patient location during evaluation: PACU Anesthesia Type: General Level of consciousness: awake and alert Pain management: pain level controlled Vital Signs Assessment: post-procedure vital signs reviewed and stable Respiratory status: spontaneous breathing, nonlabored ventilation, respiratory function stable and patient connected to nasal cannula oxygen Cardiovascular status: blood pressure returned to baseline and stable Postop Assessment: no apparent nausea or vomiting Anesthetic complications: no   No notable events documented.   Last Vitals:  Vitals:   03/02/24 0942 03/02/24 0948  BP: 100/63 106/65  Pulse: 78 79  Resp: 16 15  Temp: 36.8 C   SpO2: 95% 95%    Last Pain:  Vitals:   03/02/24 0948  TempSrc:   PainSc: 0-No pain                 Andrea Limes

## 2024-03-02 NOTE — Discharge Instructions (Signed)
 EGD Discharge instructions Please read the instructions outlined below and refer to this sheet in the next few weeks. These discharge instructions provide you with general information on caring for yourself after you leave the hospital. Your doctor may also give you specific instructions. While your treatment has been planned according to the most current medical practices available, unavoidable complications occasionally occur. If you have any problems or questions after discharge, please call your doctor. ACTIVITY You may resume your regular activity but move at a slower pace for the next 24 hours.  Take frequent rest periods for the next 24 hours.  Walking will help expel (get rid of) the air and reduce the bloated feeling in your abdomen.  No driving for 24 hours (because of the anesthesia (medicine) used during the test).  You may shower.  Do not sign any important legal documents or operate any machinery for 24 hours (because of the anesthesia used during the test).  NUTRITION Drink plenty of fluids.  You may resume your normal diet.  Begin with a light meal and progress to your normal diet.  Avoid alcoholic beverages for 24 hours or as instructed by your caregiver.  MEDICATIONS You may resume your normal medications unless your caregiver tells you otherwise.  WHAT YOU CAN EXPECT TODAY You may experience abdominal discomfort such as a feeling of fullness or "gas" pains.  FOLLOW-UP Your doctor will discuss the results of your test with you.  SEEK IMMEDIATE MEDICAL ATTENTION IF ANY OF THE FOLLOWING OCCUR: Excessive nausea (feeling sick to your stomach) and/or vomiting.  Severe abdominal pain and distention (swelling).  Trouble swallowing.  Temperature over 101 F (37.8 C).  Rectal bleeding or vomiting of blood.      No varices seen today.  Your esophagus looks good.  Office visit with us  in 1 year.

## 2024-03-02 NOTE — Interval H&P Note (Signed)
 History and Physical Interval Note:  03/02/2024 9:25 AM  Frank Castillo  has presented today for surgery, with the diagnosis of cirrhosis, screening for esophageal varices.  The various methods of treatment have been discussed with the patient and family. After consideration of risks, benefits and other options for treatment, the patient has consented to  Procedure(s) with comments: EGD (ESOPHAGOGASTRODUODENOSCOPY) (N/A) - 9:30 am, ok rm 1-2 as a surgical intervention.  The patient's history has been reviewed, patient examined, no change in status, stable for surgery.  I have reviewed the patient's chart and labs.  Questions were answered to the patient's satisfaction.     Marcea Rojek   no change.   screening EGD per plan.

## 2024-03-02 NOTE — Anesthesia Preprocedure Evaluation (Addendum)
 Anesthesia Evaluation  Patient identified by MRN, date of birth, ID band Patient awake    Reviewed: Allergy & Precautions, H&P , NPO status , Patient's Chart, lab work & pertinent test results  History of Anesthesia Complications (+) history of anesthetic complications  Airway Mallampati: II  TM Distance: >3 FB Neck ROM: Full    Dental  (+) Missing   Pulmonary former smoker   Pulmonary exam normal breath sounds clear to auscultation       Cardiovascular hypertension, Normal cardiovascular exam Rhythm:Regular Rate:Normal     Neuro/Psych  PSYCHIATRIC DISORDERS Anxiety Depression     Neuromuscular disease    GI/Hepatic ,GERD  ,,(+) Cirrhosis   Esophageal Varices      Endo/Other  negative endocrine ROS    Renal/GU negative Renal ROS  negative genitourinary   Musculoskeletal negative musculoskeletal ROS (+)    Abdominal   Peds negative pediatric ROS (+)  Hematology  (+) Blood dyscrasia, anemia   Anesthesia Other Findings   Reproductive/Obstetrics negative OB ROS                              Anesthesia Physical Anesthesia Plan  ASA: 3  Anesthesia Plan: General   Post-op Pain Management:    Induction: Intravenous  PONV Risk Score and Plan:   Airway Management Planned: Nasal Cannula  Additional Equipment:   Intra-op Plan:   Post-operative Plan:   Informed Consent: I have reviewed the patients History and Physical, chart, labs and discussed the procedure including the risks, benefits and alternatives for the proposed anesthesia with the patient or authorized representative who has indicated his/her understanding and acceptance.     Dental advisory given  Plan Discussed with: CRNA  Anesthesia Plan Comments:          Anesthesia Quick Evaluation

## 2024-03-02 NOTE — Op Note (Signed)
 [facility] Patient Name: Frank Castillo Procedure Date: 03/02/2024 9:11 AM MRN: [id number] Date of Birth: December 05, 1950 Attending MD: Lamar Ozell Hollingshead , MD, [id number] CSN: [id number] Age: 73 Admit Type: Outpatient Procedure:                Upper GI endoscopy Indications:              Cirrhosis with suspected esophageal varices Providers:                Lamar Ozell Hollingshead, MD, Crystal Page, Bascom Blush Referring MD:              Medicines:                Propofol  per Anesthesia Complications:            No immediate complications. Estimated Blood Loss:     Estimated blood loss: none. Procedure:                Pre-Anesthesia Assessment:                           - Prior to the procedure, a History and Physical                            was performed, and patient medications and                            allergies were reviewed. The patient's tolerance of                            previous anesthesia was also reviewed. The risks                            and benefits of the procedure and the sedation                            options and risks were discussed with the patient.                            All questions were answered, and informed consent                            was obtained. Prior Anticoagulants: The patient has                            taken no anticoagulant or antiplatelet agents. ASA                            Grade Assessment: III - A patient with severe                            systemic disease. After reviewing the risks and                            benefits, the patient was deemed in satisfactory  condition to undergo the procedure.                           After obtaining informed consent, the endoscope was                            passed under direct vision. Throughout the                            procedure, the patient's blood pressure, pulse, and                            oxygen saturations were monitored  continuously. The                            [id number] ([id number]) Upper was introduced through                            the mouth, and advanced to the second part of                            duodenum. The upper GI endoscopy was accomplished                            without difficulty. The patient tolerated the                            procedure well. Scope In: 9:35:41 AM Scope Out: 9:39:53 AM Total Procedure Duration: 0 hours 4 minutes 12 seconds  Findings:      The examined esophagus was normal.      Mild portal hypertensive gastropathy was found in the entire examined       stomach.      The duodenal bulb and second portion of the duodenum were normal. Impression:               - Normal esophagus.                           - Portal hypertensive gastropathy.                           - Normal duodenal bulb and second portion of the                            duodenum.                           - No specimens collected. Moderate Sedation:      Moderate (conscious) sedation was personally administered by an       anesthesia professional. The following parameters were monitored: oxygen       saturation, heart rate, blood pressure, respiratory rate, EKG, adequacy       of pulmonary ventilation, and response to care. Recommendation:           - Patient has a contact number available for  emergencies. The signs and symptoms of potential                            delayed complications were discussed with the                            patient. Return to normal activities tomorrow.                            Written discharge instructions were provided to the                            patient.                           - Advance diet as tolerated.                           - Continue present medications.                           - Repeat upper endoscopy timing to be determined by                            noninvasive parameters. He remains well  compensated                            with a platelet count well in the normal range.                            Office visit with us  in 1 year. Procedure Code(s):        --- Professional ---                           [phone], Esophagogastroduodenoscopy, flexible,                            transoral; diagnostic, including collection of                            specimen(s) by brushing or washing, when performed                            (separate procedure) Diagnosis Code(s):        --- Professional ---                           K76.6, Portal hypertension                           K31.89, Other diseases of stomach and duodenum                           K74.60, Unspecified cirrhosis of liver CPT copyright 2022 American Medical Association. All rights reserved. The codes documented in this report are preliminary and upon coder review may  be  revised to meet current compliance requirements. Lamar HERO. Marvel Sapp, MD Lamar Ozell Hollingshead, MD 03/02/2024 9:47:10 AM This report has been signed electronically. Number of Addenda: 0

## 2024-03-06 ENCOUNTER — Encounter (HOSPITAL_COMMUNITY): Payer: Self-pay | Admitting: Internal Medicine

## 2024-03-09 DIAGNOSIS — J439 Emphysema, unspecified: Secondary | ICD-10-CM | POA: Diagnosis not present

## 2024-03-09 DIAGNOSIS — E118 Type 2 diabetes mellitus with unspecified complications: Secondary | ICD-10-CM | POA: Diagnosis not present

## 2024-03-09 DIAGNOSIS — I700 Atherosclerosis of aorta: Secondary | ICD-10-CM | POA: Diagnosis not present

## 2024-03-09 DIAGNOSIS — I10 Essential (primary) hypertension: Secondary | ICD-10-CM | POA: Diagnosis not present

## 2024-03-15 DIAGNOSIS — Z8673 Personal history of transient ischemic attack (TIA), and cerebral infarction without residual deficits: Secondary | ICD-10-CM | POA: Diagnosis not present

## 2024-03-15 DIAGNOSIS — R519 Headache, unspecified: Secondary | ICD-10-CM | POA: Diagnosis not present

## 2024-03-15 DIAGNOSIS — I10 Essential (primary) hypertension: Secondary | ICD-10-CM | POA: Diagnosis not present

## 2024-03-15 DIAGNOSIS — F321 Major depressive disorder, single episode, moderate: Secondary | ICD-10-CM | POA: Diagnosis not present

## 2024-03-15 DIAGNOSIS — E782 Mixed hyperlipidemia: Secondary | ICD-10-CM | POA: Diagnosis not present

## 2024-03-15 DIAGNOSIS — F17201 Nicotine dependence, unspecified, in remission: Secondary | ICD-10-CM | POA: Diagnosis not present

## 2024-03-15 DIAGNOSIS — Z23 Encounter for immunization: Secondary | ICD-10-CM | POA: Diagnosis not present

## 2024-03-24 DIAGNOSIS — I700 Atherosclerosis of aorta: Secondary | ICD-10-CM | POA: Diagnosis not present

## 2024-03-24 DIAGNOSIS — J439 Emphysema, unspecified: Secondary | ICD-10-CM | POA: Diagnosis not present

## 2024-03-24 DIAGNOSIS — E118 Type 2 diabetes mellitus with unspecified complications: Secondary | ICD-10-CM | POA: Diagnosis not present

## 2024-03-24 DIAGNOSIS — I10 Essential (primary) hypertension: Secondary | ICD-10-CM | POA: Diagnosis not present

## 2024-03-24 DIAGNOSIS — F321 Major depressive disorder, single episode, moderate: Secondary | ICD-10-CM | POA: Diagnosis not present

## 2024-03-30 DIAGNOSIS — H35371 Puckering of macula, right eye: Secondary | ICD-10-CM | POA: Diagnosis not present

## 2024-03-30 DIAGNOSIS — H43391 Other vitreous opacities, right eye: Secondary | ICD-10-CM | POA: Diagnosis not present

## 2024-03-30 DIAGNOSIS — H43811 Vitreous degeneration, right eye: Secondary | ICD-10-CM | POA: Diagnosis not present

## 2024-03-31 ENCOUNTER — Inpatient Hospital Stay: Attending: Hematology

## 2024-03-31 ENCOUNTER — Other Ambulatory Visit

## 2024-03-31 DIAGNOSIS — Z923 Personal history of irradiation: Secondary | ICD-10-CM | POA: Insufficient documentation

## 2024-03-31 DIAGNOSIS — D509 Iron deficiency anemia, unspecified: Secondary | ICD-10-CM | POA: Diagnosis not present

## 2024-03-31 DIAGNOSIS — Z79899 Other long term (current) drug therapy: Secondary | ICD-10-CM | POA: Diagnosis not present

## 2024-03-31 DIAGNOSIS — Z7982 Long term (current) use of aspirin: Secondary | ICD-10-CM | POA: Insufficient documentation

## 2024-03-31 DIAGNOSIS — C099 Malignant neoplasm of tonsil, unspecified: Secondary | ICD-10-CM

## 2024-03-31 DIAGNOSIS — Z85818 Personal history of malignant neoplasm of other sites of lip, oral cavity, and pharynx: Secondary | ICD-10-CM | POA: Diagnosis not present

## 2024-03-31 DIAGNOSIS — Z8546 Personal history of malignant neoplasm of prostate: Secondary | ICD-10-CM | POA: Diagnosis not present

## 2024-03-31 DIAGNOSIS — Z8589 Personal history of malignant neoplasm of other organs and systems: Secondary | ICD-10-CM

## 2024-03-31 DIAGNOSIS — Z9221 Personal history of antineoplastic chemotherapy: Secondary | ICD-10-CM | POA: Diagnosis not present

## 2024-03-31 DIAGNOSIS — Z87891 Personal history of nicotine dependence: Secondary | ICD-10-CM | POA: Diagnosis not present

## 2024-03-31 LAB — CBC WITH DIFFERENTIAL/PLATELET
Abs Immature Granulocytes: 0.14 K/uL — ABNORMAL HIGH (ref 0.00–0.07)
Basophils Absolute: 0.2 K/uL — ABNORMAL HIGH (ref 0.0–0.1)
Basophils Relative: 2 %
Eosinophils Absolute: 0.2 K/uL (ref 0.0–0.5)
Eosinophils Relative: 2 %
HCT: 44.4 % (ref 39.0–52.0)
Hemoglobin: 14.4 g/dL (ref 13.0–17.0)
Immature Granulocytes: 1 %
Lymphocytes Relative: 20 %
Lymphs Abs: 2.2 K/uL (ref 0.7–4.0)
MCH: 29.5 pg (ref 26.0–34.0)
MCHC: 32.4 g/dL (ref 30.0–36.0)
MCV: 91 fL (ref 80.0–100.0)
Monocytes Absolute: 0.7 K/uL (ref 0.1–1.0)
Monocytes Relative: 7 %
Neutro Abs: 7.5 K/uL (ref 1.7–7.7)
Neutrophils Relative %: 68 %
Platelets: 280 K/uL (ref 150–400)
RBC: 4.88 MIL/uL (ref 4.22–5.81)
RDW: 14.4 % (ref 11.5–15.5)
WBC: 10.9 K/uL — ABNORMAL HIGH (ref 4.0–10.5)
nRBC: 0 % (ref 0.0–0.2)

## 2024-03-31 LAB — TSH: TSH: 3.97 u[IU]/mL (ref 0.350–4.500)

## 2024-03-31 LAB — IRON AND TIBC
Iron: 58 ug/dL (ref 45–182)
Saturation Ratios: 16 % — ABNORMAL LOW (ref 17.9–39.5)
TIBC: 353 ug/dL (ref 250–450)
UIBC: 295 ug/dL

## 2024-03-31 LAB — FERRITIN: Ferritin: 210 ng/mL (ref 24–336)

## 2024-04-07 ENCOUNTER — Inpatient Hospital Stay (HOSPITAL_BASED_OUTPATIENT_CLINIC_OR_DEPARTMENT_OTHER): Admitting: Oncology

## 2024-04-07 DIAGNOSIS — Z85819 Personal history of malignant neoplasm of unspecified site of lip, oral cavity, and pharynx: Secondary | ICD-10-CM

## 2024-04-07 DIAGNOSIS — Z8546 Personal history of malignant neoplasm of prostate: Secondary | ICD-10-CM | POA: Diagnosis not present

## 2024-04-07 DIAGNOSIS — D509 Iron deficiency anemia, unspecified: Secondary | ICD-10-CM | POA: Diagnosis not present

## 2024-04-07 DIAGNOSIS — C099 Malignant neoplasm of tonsil, unspecified: Secondary | ICD-10-CM

## 2024-04-07 NOTE — Progress Notes (Signed)
 [facility] [address] [place] [address]    Clinic Day:  12/31/2022  Referring physician: Rolinda Millman, MD  Patient Care Team: Rolinda Millman, MD as PCP - General (Family Medicine) Debera Jayson MATSU, MD as PCP - Cardiology (Cardiology) Shaaron Lamar HERO, MD as Consulting Physician (Gastroenterology) Grayce Buddle, RN Nurse Navigator as Registered Nurse (Medical Oncology)  I connected with Lamar Just on 04/07/24 at  2:15 PM EST by telephone visit and verified that I am speaking with the correct person using two identifiers.   I discussed the limitations, risks, security and privacy concerns of performing an evaluation and management service by telemedicine and the availability of in-person appointments. I also discussed with the patient that there may be a patient responsible charge related to this service. The patient expressed understanding and agreed to proceed.   Other persons participating in the visit and their role in the encounter: NP, Patient   Patient's location: Home  Provider's location: Clinic    ASSESSMENT & PLAN:   Assessment: 1.  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   2.  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 Karis, referred to Dr. Lauralee who did marginal mandibulectomy with primary closure on 09/20/2017. -This showed 2.1 x 0.9 x 0.4 cm squamous cell carcinoma with mandibular bone invasion.  3.  Prostate cancer: - Stage T1c adenocarcinoma, Gleason 4+3, PSA 5.91 - Status post seed implantation by Dr. Patrcia.    Plan: 1.  Iron  deficiency state: - Ferritin continues to be low at 16 but his hemoglobin remains normal.  He has consistently  been taking iron  supplements 3 times per week. -Patient received 2 doses of 400 mg IV Venofer  on 01/05/2024 and 01/14/2024. -Repeat labs from 03/31/2024 showed hemoglobin 14.4, ferritin 210 (16) and iron  saturation 16%. -No additional IV iron  needed at this time.  We will recheck labs at his next visit. -If he is able to take oral iron  recommend iron  tablets 1 every other day along with a source of vitamin C.   Orders Placed This Encounter  Procedures   Ferritin    Standing Status:   Future    Expected Date:   01/05/2025    Expiration Date:   04/05/2025   Iron  and TIBC ([facility] ONLY)    Standing Status:   Future    Expected Date:   01/05/2025    Expiration Date:   04/05/2025   CBC with Differential    Standing Status:   Future    Expected Date:   01/05/2025    Expiration Date:   04/05/2025      [name], NP   11/14/20252:26 PM  CHIEF COMPLAINT:   Diagnosis: Malignant neoplasm of prostate and tonsil cancer   Cancer Staging  Malignant neoplasm of prostate [facility]) Staging form: Prostate, AJCC 8th Edition - Clinical stage from 02/08/2020: Stage IIC (cT1c, cN0, cM0, PSA: 5.9, Grade Group: 3) - Unsigned  Tonsil cancer (HCC) Staging form: Pharynx - Oropharynx, AJCC 7th Edition - Clinical: Stage IVA (T2, N2b, M0) - Signed by Izell Domino, MD on 08/02/2015    Prior Therapy: 1. Left tonsillectomy on 07/09/2015 2. Chemoradiation with cisplatin  x 1 cycle and carboplatin , 5-FU x 2 cycles from 09/11/2015  to 09/30/2015 and 70 Gy.  Current Therapy:  surveillance   HISTORY OF PRESENT ILLNESS:   Oncology History  Tonsil cancer (HCC)  08/02/2015 Initial Diagnosis   Cancer of tonsillar fossa (HCC)   08/20/2015 Procedure   Port-a-cath placed by IR   08/20/2015 Procedure   G-tube placed by IR   08/21/2015 - 10/09/2015 Radiation Therapy   70 Gy, Dr. Izell.   08/21/2015 - 09/10/2015 Chemotherapy   Cisplatin  every 21 days with XRT.   08/28/2015 Adverse Reaction    Tinnitis, Cisplatin -induced.   09/11/2015 - 09/30/2015 Chemotherapy   Carboplatin /5FU   09/20/2015 - 09/21/2015 Hospital Admission   Intractable nausea/vomiting. Severe Mucositis   10/17/2015 - 10/20/2015 Hospital Admission   Esophageal/oral candidasis. Shingles. Fever. Chemotherapy induced pancytopenia   01/24/2016 PET scan   Resolution of the left tonsillar mass and left neck adenopathy. No findings for residual or locally recurrent tumor, adenopathy or distant metastatic disease.   03/28/2016 - 04/08/2016 Hospital Admission   Admitted with severe abdominal pain. Found to diffuse peritonitis from perforated sigmoid colon requiring colostomy. Discharged to [facility] in [place].          INTERVAL HISTORY:   Pheonix is a 73 y.o. male presenting to clinic today for follow-up for iron  deficiency anemia.  He also has history of malignant neoplasm of prostate and tonsils.  Patient recently received 2 doses of IV Venofer  in August 2025.  He tolerated them well.  He is here to review results.  Patient is followed by us  annually for tonsillar cancer.Based on chart review, he has not had any hospitalizations or surgeries.  He is followed by Dr. Shaaron for cirrhosis.  Most recent abdominal ultrasound showed cholelithiasis without secondary signs of acute cholecystitis and mild increased hepatic parenchymal echogenicity suggestive of steatosis.  He continues iron  supplements 3 times per week.  Reports he started smoking again a few months ago.  He is currently on Chantix  and is smoking less than 8 cigarettes/day.   Today, he states that he is doing well overall. His appetite level is at 100%. His energy level is at 75%.  Reports overall doing well.  Has headaches occasionally and trouble swallowing and chewing.  He denies any new lumps or bumps or concerns for recurrence of his cancer.  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   Wears contact lenses    in left eye    Surgical History: Past Surgical History:  Procedure Laterality Date   BIOPSY  07/02/2016   Procedure: BIOPSY;  Surgeon: Lamar CHRISTELLA Shaaron, MD;  [facility];  Service: Endoscopy;;  esophageal   BIOPSY  12/05/2021   Procedure: BIOPSY;  Surgeon: Shaaron Lamar HERO, MD;  [facility];  Service: Endoscopy;;   COLON SURGERY  2017   ruptured colon   COLONOSCOPY WITH PROPOFOL  N/A 07/02/2016   Procedure: COLONOSCOPY WITH PROPOFOL ;  Surgeon: Lamar HERO Shaaron, MD;  [facility];  Service: Endoscopy;  Laterality: N/A;  9:45am   COLOSTOMY TAKEDOWN N/A 09/02/2016   Procedure: LAPAROSCOPIC COLOSTOMY REVERSAL;  Surgeon: Bernarda Ned, MD;  [facility];  Service: General;  Laterality: N/A;   ESOPHAGOGASTRODUODENOSCOPY N/A 03/02/2024   Procedure: EGD (ESOPHAGOGASTRODUODENOSCOPY);  Surgeon: Shaaron Lamar HERO, MD;  [facility];  Service: Endoscopy;  Laterality: N/A;  9:30 am, ok rm 1-2   ESOPHAGOGASTRODUODENOSCOPY (EGD) WITH PROPOFOL  N/A 07/02/2016   Procedure: ESOPHAGOGASTRODUODENOSCOPY (EGD) WITH PROPOFOL ;  Surgeon: Lamar HERO Shaaron, MD;  [facility];  Service: Endoscopy;  Laterality: N/A;   ESOPHAGOGASTRODUODENOSCOPY (EGD) WITH PROPOFOL  N/A 12/28/2019   Procedure: ESOPHAGOGASTRODUODENOSCOPY (EGD) WITH PROPOFOL ;  Surgeon: Shaaron Lamar HERO, MD;  [facility];  Service: Endoscopy;  Laterality: N/A;  11:00am   FLEXIBLE SIGMOIDOSCOPY N/A 12/05/2021   Procedure: FLEXIBLE SIGMOIDOSCOPY;  Surgeon: Shaaron Lamar HERO, MD;  [facility];  Service: Endoscopy;  Laterality: N/A;  2:45pm, asa 3   HERNIA REPAIR Right 2019   dr jerral at [facility] with mesh   HOT HEMOSTASIS  12/05/2021   Procedure: HOT HEMOSTASIS (ARGON PLASMA COAGULATION/BICAP);  Surgeon: Shaaron Lamar HERO, MD;  [facility];  Service: Endoscopy;;   INCISIONAL HERNIA REPAIR  09/02/2016   Procedure: HERNIA REPAIR INCISIONAL;  Surgeon: Bernarda Ned, MD;  [facility];  Service: General;;   LAPAROTOMY N/A 03/28/2016   Procedure: EXPLORATORY LAPAROTOMY, SIGMOID COLECTOMY, COLOSTOMY;  Surgeon: Bernarda Ned, MD;  [facility];  Service: General;  Laterality: N/A;   MALONEY DILATION N/A 07/02/2016   Procedure: AGAPITO HODGKIN;  Surgeon: Lamar HERO Shaaron, MD;  [facility];  Service: Endoscopy;  Laterality: N/A;   MOUTH SURGERY  2019   left lower jaw   PORT-A-CATH REMOVAL  09/02/2016   Procedure: REMOVAL PORT-A-CATH;  Surgeon: Bernarda Ned, MD;  [facility];  Service: General;;   PROSTATE BIOPSY  2021   RADIOACTIVE SEED IMPLANT N/A 06/28/2020   Procedure: RADIOACTIVE SEED IMPLANT/BRACHYTHERAPY IMPLANT WITH CYSTOSCOPY;  Surgeon: Selma Donnice SAUNDERS, MD;  Location[facility];  Service: Urology;  Laterality: N/A;   REMOVAL OF GASTROSTOMY TUBE N/A 09/02/2016   Procedure: REMOVAL OF GASTROSTOMY TUBE;  Surgeon: Bernarda Ned, MD;  [facility];  Service: General;  Laterality: N/A;   SPACE OAR INSTILLATION N/A 06/28/2020   Procedure: SPACE OAR INSTILLATION;  Surgeon: Selma Donnice SAUNDERS, MD;  Location: [facility];  Service: Urology;  Laterality: N/A;   TONSILLECTOMY Left 07/09/2015   Procedure: LEFT TONSILLECTOMY;  Surgeon: Daniel Moccasin, MD;  Location: [facility];  Service: ENT;  Laterality: Left;    TONSILLECTOMY  2017   left tonsil   Z-PLASTY SCAR REVISION N/A 09/02/2016   Procedure: SCAR REVISION;  Surgeon: Bernarda Ned, MD;  [facility];  Service: General;  Laterality: N/A;    Social History: Social History   Socioeconomic History   Marital status: Single    Spouse name: Not on file   Number of children: 0   Years of education: Not on file   Highest education level: Not on file  Occupational History   Not on file  Tobacco Use   Smoking status: Former  Current packs/day: 0.00    Average packs/day: 0.5 packs/day for 30.0 years (15.0 ttl pk-yrs)    Types: Cigarettes    Start date: 11/23/1986    Quit date: 11/22/2016    Years since quitting: 7.3   Smokeless tobacco: Never   Tobacco comments:    smoke social occ  Vaping Use   Vaping status: Never Used  Substance and Sexual Activity   Alcohol use: No    Alcohol/week: 0.0 standard drinks of alcohol    Comment: former abuse- quit 09/2011   Drug use: No   Sexual activity: Not Currently  Other Topics Concern   Not on file  Social History Narrative   Divorced.   No children.   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   Worked in youth worker.    Fully retired at this time.    Has been smoking off/on since age 48, quit smoking 11/2016.    Exercises minimally   Eats all food groups.   Wears seatbelt.   Mother lives with him, has dementia.   Attends AA meetings.    Social Drivers of Corporate Investment Banker Strain: Not on file  Food Insecurity: Low Risk  (12/22/2022)   Received from [facility]   Hunger Vital Sign    Within the past 12 months, you worried that your food would run out before you got money to buy more: Never true    Within the past 12 months, the food you bought just didn't last and you didn't have money to get more. : Never true  Transportation Needs: No Transportation Needs (12/22/2022)   Received from [employer]    In the past 12 months,  has lack of reliable transportation kept you from medical appointments, meetings, work or from getting things needed for daily living? : No  Physical Activity: Not on file  Stress: Not on file  Social Connections: Not on file  Intimate Partner Violence: Not At Risk (06/27/2020)   Humiliation, Afraid, Rape, and Kick questionnaire    Fear of Current or Ex-Partner: No    Emotionally Abused: No    Physically Abused: No    Sexually Abused: No    Family History: Family History  Problem Relation Age of Onset   Macular degeneration Mother    Dementia Mother    Alcoholism Father    Prostate cancer Father    Diabetes Maternal Aunt    Diabetes Maternal Uncle    Prostate cancer Maternal Uncle    Mental illness Sister    Pancreatic cancer Neg Hx    Breast cancer Neg Hx    Colon cancer Neg Hx     Current Medications:  Current Outpatient Medications:    Accu-Chek Softclix Lancets lancets, as directed finger stick once a day; Duration: 90 days, Disp: , Rfl:    aspirin  EC 81 MG tablet, Take 1 tablet (81 mg total) by mouth daily. (Patient taking differently: Take 81 mg by mouth 2 (two) times a week.), Disp: , Rfl:    atorvastatin  (LIPITOR) 40 MG tablet, Take 1 tablet (40 mg total) by mouth daily., Disp: 90 tablet, Rfl: 0   Blood Glucose Monitoring Suppl (TRUE METRIX GO GLUCOSE METER) w/Device KIT, as directed., Disp: , Rfl:    cetirizine (ZYRTEC) 10 MG tablet, Take 10 mg by mouth daily., Disp: , Rfl:    Cholecalciferol  (VITAMIN D ) 50 MCG (2000 UT) tablet, Take 2,000 Units by mouth daily., Disp: ,  Rfl:    DULoxetine (CYMBALTA) 60 MG capsule, Take 120 mg by mouth daily., Disp: , Rfl:    ferrous sulfate 325 (65 FE) MG EC tablet, Take 325 mg by mouth daily with breakfast., Disp: , Rfl:    lisinopril  (ZESTRIL ) 10 MG tablet, Take 10 mg by mouth daily., Disp: , Rfl:    Magnesium  250 MG CAPS, Take 250 mg by mouth daily., Disp: , Rfl:    pantoprazole  (PROTONIX ) 40 MG tablet, Take 1 tablet (40 mg total)  by mouth daily., Disp: 90 tablet, Rfl: 3   sodium fluoride  (FLUORISHIELD) 1.1 % GEL dental gel, Instill one drop of gel per tooth space of fluoride  tray. Place over teeth for 5 minutes. Remove. Spit out excess. Repeat nightly. (Patient taking differently: Place 1 Application onto teeth at bedtime. Instill one drop of gel per tooth space of fluoride  tray. Place over teeth for 5 minutes. Remove. Spit out excess. Repeat nightly.), Disp: 125 mL, Rfl: 11   tamsulosin (FLOMAX) 0.4 MG CAPS capsule, Take 0.8 mg by mouth at bedtime., Disp: , Rfl:    triamcinolone cream (KENALOG) 0.1 %, Apply 1 Application topically 2 (two) times daily as needed (eczema)., Disp: , Rfl:    TRUE METRIX BLOOD GLUCOSE TEST test strip, as directed., Disp: , Rfl:    Turmeric 500 MG CAPS, Take 500 mg by mouth in the morning and at bedtime., Disp: , Rfl:    varenicline  (CHANTIX ) 1 MG tablet, Take 1 mg by mouth 2 (two) times daily., Disp: , Rfl:  No current facility-administered medications for this visit.  Facility-Administered Medications Ordered in Other Visits:    0.9 %  sodium chloride  infusion, , Intravenous, Continuous, Kefalas, Debby RAMAN, PA-C, [place] at 03/28/16 1754   0.9 %  sodium chloride  infusion, , Intravenous, Continuous, Kefalas, Thomas S, PA-C   0.9 %  sodium chloride  infusion, , Intravenous, Continuous, Penland, Clotilda K, MD   0.9 %  sodium chloride  infusion, , Intravenous, Continuous, Penland, Clotilda POUR, MD   0.9 %  sodium chloride  infusion, , Intravenous, Continuous, Penland, Clotilda POUR, MD   Allergies: Allergies  Allergen Reactions   Bee Venom Swelling and Anaphylaxis   Other Other (See Comments)    Opthalmic mycin irritates eyes.     Tobramycin Ophthalmic [Tobramycin]     Other reaction(s): DRYNESS, IRRITATION    REVIEW OF SYSTEMS:   Review of Systems  Constitutional:  Positive for fatigue.  HENT:   Positive for trouble swallowing.   Gastrointestinal:  Negative for blood in stool.  Neurological:   Positive for dizziness and headaches.  Psychiatric/Behavioral:  The patient is nervous/anxious.      VITALS:   There were no vitals taken for this visit.  Wt Readings from Last 3 Encounters:  03/02/24 185 lb (83.9 kg)  02/08/24 185 lb 12.8 oz (84.3 kg)  01/05/24 185 lb 9.6 oz (84.2 kg)    There is no height or weight on file to calculate BMI.  Performance status (ECOG): 1 - Symptomatic but completely ambulatory  PHYSICAL EXAM:   Physical Exam Neurological:     Mental Status: He is alert and oriented to person, place, and time.     LABS:      Latest Ref Rng & Units 03/31/2024    8:36 AM 12/28/2023    8:07 AM 07/14/2023    9:18 AM  CBC  WBC 4.0 - 10.5 K/uL 10.9  7.8  6.8   Hemoglobin 13.0 - 17.0 g/dL 85.5  14.9  13.3   Hematocrit 39.0 - 52.0 % 44.4  47.0  41.8   Platelets 150 - 400 K/uL 280  237  268       Latest Ref Rng & Units 12/28/2023    8:07 AM 07/14/2023    9:18 AM 12/24/2022    8:59 AM  CMP  Glucose 70 - 99 mg/dL 858  860  812   BUN 8 - 23 mg/dL 21  15  18    Creatinine 0.61 - 1.24 mg/dL 8.97  9.23  9.17   Sodium 135 - 145 mmol/L 138  140  136   Potassium 3.5 - 5.1 mmol/L 5.1  4.5  4.1   Chloride 98 - 111 mmol/L 102  105  101   CO2 22 - 32 mmol/L 25  29  27    Calcium  8.9 - 10.3 mg/dL 9.8  9.6  9.0   Total Protein 6.5 - 8.1 g/dL 7.0  7.0  6.9   Total Bilirubin 0.0 - 1.2 mg/dL 0.6  0.3  0.4   Alkaline Phos 38 - 126 U/L 40   46   AST 15 - 41 U/L 14  16  16    ALT 0 - 44 U/L 14  14  18       No results found for: CEA1, CEA / No results found for: CEA1, CEA No results found for: PSA1 No results found for: CAN199 No results found for: CAN125  No results found for: TOTALPROTELP, ALBUMINELP, A1GS, A2GS, EARLA BABCOCK, GAMS, MSPIKE, SPEI Lab Results  Component Value Date   TIBC 353 03/31/2024   TIBC 452 (H) 12/28/2023   TIBC 410 12/24/2022   FERRITIN 210 03/31/2024   FERRITIN 16 (L) 12/28/2023   FERRITIN 10 (L) 12/24/2022    IRONPCTSAT 16 (L) 03/31/2024   IRONPCTSAT 20 12/28/2023   IRONPCTSAT 11 (L) 12/24/2022   Lab Results  Component Value Date   LDH 118 12/12/2019     STUDIES:   No results found.   I provided 25 minutes of non face-to-face telephone visit time during this encounter, and > 50% was spent counseling as documented under my assessment & plan.   Delon Hope, NP 04/07/2024 2:26 PM

## 2024-04-08 DIAGNOSIS — I700 Atherosclerosis of aorta: Secondary | ICD-10-CM | POA: Diagnosis not present

## 2024-04-08 DIAGNOSIS — I10 Essential (primary) hypertension: Secondary | ICD-10-CM | POA: Diagnosis not present

## 2024-04-08 DIAGNOSIS — E118 Type 2 diabetes mellitus with unspecified complications: Secondary | ICD-10-CM | POA: Diagnosis not present

## 2024-04-08 DIAGNOSIS — J439 Emphysema, unspecified: Secondary | ICD-10-CM | POA: Diagnosis not present

## 2024-04-23 DIAGNOSIS — J439 Emphysema, unspecified: Secondary | ICD-10-CM | POA: Diagnosis not present

## 2024-04-23 DIAGNOSIS — I10 Essential (primary) hypertension: Secondary | ICD-10-CM | POA: Diagnosis not present

## 2024-04-23 DIAGNOSIS — F321 Major depressive disorder, single episode, moderate: Secondary | ICD-10-CM | POA: Diagnosis not present

## 2024-04-23 DIAGNOSIS — E118 Type 2 diabetes mellitus with unspecified complications: Secondary | ICD-10-CM | POA: Diagnosis not present

## 2024-04-23 DIAGNOSIS — I700 Atherosclerosis of aorta: Secondary | ICD-10-CM | POA: Diagnosis not present

## 2024-05-22 ENCOUNTER — Encounter: Payer: Self-pay | Admitting: *Deleted

## 2025-01-01 ENCOUNTER — Ambulatory Visit (HOSPITAL_COMMUNITY)

## 2025-01-01 ENCOUNTER — Other Ambulatory Visit

## 2025-01-09 ENCOUNTER — Ambulatory Visit: Admitting: Oncology
